# Patient Record
Sex: Male | Born: 1955
Health system: Southern US, Community
[De-identification: ages and names within clinical notes are randomized; demographics above are authoritative.]

## PROBLEM LIST (undated history)

## (undated) DIAGNOSIS — I10 Essential (primary) hypertension: Secondary | ICD-10-CM

## (undated) DIAGNOSIS — R7302 Impaired glucose tolerance (oral): Secondary | ICD-10-CM

## (undated) DIAGNOSIS — F411 Generalized anxiety disorder: Secondary | ICD-10-CM

## (undated) DIAGNOSIS — K589 Irritable bowel syndrome without diarrhea: Secondary | ICD-10-CM

## (undated) DIAGNOSIS — K219 Gastro-esophageal reflux disease without esophagitis: Secondary | ICD-10-CM

## (undated) DIAGNOSIS — F1910 Other psychoactive substance abuse, uncomplicated: Secondary | ICD-10-CM

## (undated) DIAGNOSIS — E785 Hyperlipidemia, unspecified: Secondary | ICD-10-CM

## (undated) DIAGNOSIS — E739 Lactose intolerance, unspecified: Secondary | ICD-10-CM

## (undated) DIAGNOSIS — F17200 Nicotine dependence, unspecified, uncomplicated: Secondary | ICD-10-CM

## (undated) DIAGNOSIS — D649 Anemia, unspecified: Secondary | ICD-10-CM

## (undated) DIAGNOSIS — C801 Malignant (primary) neoplasm, unspecified: Secondary | ICD-10-CM

## (undated) DIAGNOSIS — M5090 Cervical disc disorder, unspecified, unspecified cervical region: Secondary | ICD-10-CM

## (undated) DIAGNOSIS — N529 Male erectile dysfunction, unspecified: Secondary | ICD-10-CM

## (undated) HISTORY — DX: Hyperlipidemia, unspecified: E78.5

## (undated) HISTORY — DX: Anemia, unspecified: D64.9

## (undated) HISTORY — DX: Nicotine dependence, unspecified, uncomplicated: F17.200

## (undated) HISTORY — DX: Cervical disc disorder, unspecified, unspecified cervical region: M50.90

## (undated) HISTORY — DX: Essential (primary) hypertension: I10

## (undated) HISTORY — DX: Male erectile dysfunction, unspecified: N52.9

## (undated) HISTORY — DX: Lactose intolerance, unspecified: E73.9

## (undated) HISTORY — DX: Generalized anxiety disorder: F41.1

## (undated) HISTORY — DX: Impaired glucose tolerance (oral): R73.02

## (undated) HISTORY — PX: NO PAST SURGERIES: SHX2092

## (undated) HISTORY — DX: Other psychoactive substance abuse, uncomplicated: F19.10

---

## 1999-06-17 DIAGNOSIS — F1910 Other psychoactive substance abuse, uncomplicated: Secondary | ICD-10-CM

## 1999-06-17 HISTORY — DX: Other psychoactive substance abuse, uncomplicated: F19.10

## 2000-05-13 ENCOUNTER — Inpatient Hospital Stay (HOSPITAL_COMMUNITY): Admission: EM | Admit: 2000-05-13 | Discharge: 2000-05-22 | Payer: Self-pay | Admitting: Psychiatry

## 2000-05-14 ENCOUNTER — Encounter: Payer: Self-pay | Admitting: Emergency Medicine

## 2000-05-14 ENCOUNTER — Emergency Department (HOSPITAL_COMMUNITY): Admission: EM | Admit: 2000-05-14 | Discharge: 2000-05-14 | Payer: Self-pay | Admitting: Emergency Medicine

## 2004-02-16 ENCOUNTER — Encounter: Admission: RE | Admit: 2004-02-16 | Discharge: 2004-02-16 | Payer: Self-pay | Admitting: Internal Medicine

## 2004-02-17 ENCOUNTER — Encounter: Admission: RE | Admit: 2004-02-17 | Discharge: 2004-02-17 | Payer: Self-pay | Admitting: Internal Medicine

## 2005-06-16 HISTORY — PX: COLONOSCOPY: SHX174

## 2005-09-23 ENCOUNTER — Ambulatory Visit: Payer: Self-pay | Admitting: Internal Medicine

## 2005-10-22 ENCOUNTER — Ambulatory Visit: Payer: Self-pay | Admitting: Internal Medicine

## 2005-10-28 ENCOUNTER — Ambulatory Visit: Payer: Self-pay | Admitting: Internal Medicine

## 2005-11-19 ENCOUNTER — Ambulatory Visit: Payer: Self-pay | Admitting: Gastroenterology

## 2005-12-03 ENCOUNTER — Ambulatory Visit: Payer: Self-pay | Admitting: Gastroenterology

## 2005-12-03 LAB — HM COLONOSCOPY: HM Colonoscopy: NORMAL

## 2006-09-21 ENCOUNTER — Ambulatory Visit: Payer: Self-pay | Admitting: Internal Medicine

## 2006-10-26 ENCOUNTER — Ambulatory Visit: Payer: Self-pay | Admitting: Internal Medicine

## 2006-10-26 LAB — CONVERTED CEMR LAB
ALT: 11 units/L (ref 0–40)
AST: 21 units/L (ref 0–37)
Albumin: 3.5 g/dL (ref 3.5–5.2)
Alkaline Phosphatase: 43 units/L (ref 39–117)
BUN: 6 mg/dL (ref 6–23)
Basophils Absolute: 0 10*3/uL (ref 0.0–0.1)
Basophils Relative: 0.4 % (ref 0.0–1.0)
Bilirubin Urine: NEGATIVE
Bilirubin, Direct: 0.1 mg/dL (ref 0.0–0.3)
CO2: 31 meq/L (ref 19–32)
Calcium: 9 mg/dL (ref 8.4–10.5)
Chloride: 106 meq/L (ref 96–112)
Cholesterol: 186 mg/dL (ref 0–200)
Creatinine, Ser: 0.9 mg/dL (ref 0.4–1.5)
Eosinophils Absolute: 0.2 10*3/uL (ref 0.0–0.6)
Eosinophils Relative: 3.6 % (ref 0.0–5.0)
GFR calc Af Amer: 115 mL/min
GFR calc non Af Amer: 95 mL/min
Glucose, Bld: 152 mg/dL — ABNORMAL HIGH (ref 70–99)
HCT: 41 % (ref 39.0–52.0)
HDL: 58.6 mg/dL (ref >39.0)
Hemoglobin, Urine: NEGATIVE
Hemoglobin: 13.7 g/dL (ref 13.0–17.0)
Ketones, ur: NEGATIVE mg/dL
LDL Cholesterol: 118 mg/dL — ABNORMAL HIGH (ref 0–99)
Leukocytes, UA: NEGATIVE
Lymphocytes Relative: 36.9 % (ref 12.0–46.0)
MCHC: 33.3 g/dL (ref 30.0–36.0)
MCV: 83.3 fL (ref 78.0–100.0)
Monocytes Absolute: 0.6 10*3/uL (ref 0.2–0.7)
Monocytes Relative: 9.7 % (ref 3.0–11.0)
Neutro Abs: 2.9 10*3/uL (ref 1.4–7.7)
Neutrophils Relative %: 49.4 % (ref 43.0–77.0)
Nitrite: NEGATIVE
PSA: 0.21 ng/mL (ref 0.10–4.00)
Platelets: 141 10*3/uL — ABNORMAL LOW (ref 150–400)
Potassium: 3.8 meq/L (ref 3.5–5.1)
RBC: 4.92 M/uL (ref 4.22–5.81)
RDW: 14.4 % (ref 11.5–14.6)
Sodium: 141 meq/L (ref 135–145)
Specific Gravity, Urine: 1.025 (ref 1.000–1.03)
TSH: 1.81 microintl units/mL (ref 0.35–5.50)
Total Bilirubin: 0.8 mg/dL (ref 0.3–1.2)
Total CHOL/HDL Ratio: 3.2
Total Protein, Urine: NEGATIVE mg/dL
Total Protein: 6.6 g/dL (ref 6.0–8.3)
Triglycerides: 49 mg/dL (ref 0–149)
Urine Glucose: NEGATIVE mg/dL
Urobilinogen, UA: 0.2 (ref 0.0–1.0)
VLDL: 10 mg/dL (ref 0–40)
WBC: 5.8 10*3/uL (ref 4.5–10.5)
pH: 6 (ref 5.0–8.0)

## 2006-10-30 ENCOUNTER — Ambulatory Visit: Payer: Self-pay | Admitting: Internal Medicine

## 2006-10-30 LAB — CONVERTED CEMR LAB
BUN: 8 mg/dL (ref 6–23)
CO2: 29 meq/L (ref 19–32)
Calcium: 9.1 mg/dL (ref 8.4–10.5)
Chloride: 105 meq/L (ref 96–112)
Creatinine, Ser: 0.8 mg/dL (ref 0.4–1.5)
GFR calc Af Amer: 132 mL/min
GFR calc non Af Amer: 109 mL/min
Glucose, Bld: 83 mg/dL (ref 70–99)
Hgb A1c MFr Bld: 5.3 % (ref 4.6–6.0)
Potassium: 4.2 meq/L (ref 3.5–5.1)
Sodium: 139 meq/L (ref 135–145)

## 2007-02-03 ENCOUNTER — Encounter: Payer: Self-pay | Admitting: *Deleted

## 2007-02-03 DIAGNOSIS — E785 Hyperlipidemia, unspecified: Secondary | ICD-10-CM | POA: Insufficient documentation

## 2007-02-03 DIAGNOSIS — I10 Essential (primary) hypertension: Secondary | ICD-10-CM

## 2007-02-03 DIAGNOSIS — F411 Generalized anxiety disorder: Secondary | ICD-10-CM | POA: Insufficient documentation

## 2007-02-03 HISTORY — DX: Generalized anxiety disorder: F41.1

## 2007-02-03 HISTORY — DX: Hyperlipidemia, unspecified: E78.5

## 2007-02-03 HISTORY — DX: Essential (primary) hypertension: I10

## 2007-12-01 ENCOUNTER — Ambulatory Visit: Payer: Self-pay | Admitting: Internal Medicine

## 2007-12-02 LAB — CONVERTED CEMR LAB
ALT: 11 units/L (ref 0–53)
AST: 22 units/L (ref 0–37)
Albumin: 3.7 g/dL (ref 3.5–5.2)
Alkaline Phosphatase: 53 units/L (ref 39–117)
BUN: 7 mg/dL (ref 6–23)
Basophils Absolute: 0 10*3/uL (ref 0.0–0.1)
Basophils Relative: 0.4 % (ref 0.0–1.0)
Bilirubin Urine: NEGATIVE
Bilirubin, Direct: 0.1 mg/dL (ref 0.0–0.3)
CO2: 31 meq/L (ref 19–32)
Calcium: 9.1 mg/dL (ref 8.4–10.5)
Chloride: 104 meq/L (ref 96–112)
Cholesterol: 193 mg/dL (ref 0–200)
Creatinine, Ser: 0.9 mg/dL (ref 0.4–1.5)
Eosinophils Absolute: 0.2 10*3/uL (ref 0.0–0.7)
Eosinophils Relative: 3.6 % (ref 0.0–5.0)
GFR calc Af Amer: 115 mL/min
GFR calc non Af Amer: 95 mL/min
Glucose, Bld: 121 mg/dL — ABNORMAL HIGH (ref 70–99)
HCT: 40.7 % (ref 39.0–52.0)
HDL: 62.9 mg/dL (ref >39.0)
Hemoglobin, Urine: NEGATIVE
Hemoglobin: 13.9 g/dL (ref 13.0–17.0)
Hgb A1c MFr Bld: 5.6 % (ref 4.6–6.0)
Ketones, ur: NEGATIVE mg/dL
LDL Cholesterol: 122 mg/dL — ABNORMAL HIGH (ref 0–99)
Leukocytes, UA: NEGATIVE
Lymphocytes Relative: 38.6 % (ref 12.0–46.0)
MCHC: 34.2 g/dL (ref 30.0–36.0)
MCV: 84.2 fL (ref 78.0–100.0)
Monocytes Absolute: 0.4 10*3/uL (ref 0.1–1.0)
Monocytes Relative: 8.9 % (ref 3.0–12.0)
Neutro Abs: 2 10*3/uL (ref 1.4–7.7)
Neutrophils Relative %: 48.5 % (ref 43.0–77.0)
Nitrite: NEGATIVE
PSA: 0.57 ng/mL (ref 0.10–4.00)
Platelets: 130 10*3/uL — ABNORMAL LOW (ref 150–400)
Potassium: 4 meq/L (ref 3.5–5.1)
RBC: 4.83 M/uL (ref 4.22–5.81)
RDW: 13.9 % (ref 11.5–14.6)
Sodium: 141 meq/L (ref 135–145)
Specific Gravity, Urine: 1.025 (ref 1.000–1.03)
TSH: 1.29 microintl units/mL (ref 0.35–5.50)
Total Bilirubin: 0.6 mg/dL (ref 0.3–1.2)
Total CHOL/HDL Ratio: 3.1
Total Protein, Urine: NEGATIVE mg/dL
Total Protein: 7 g/dL (ref 6.0–8.3)
Triglycerides: 39 mg/dL (ref 0–149)
Urine Glucose: NEGATIVE mg/dL
Urobilinogen, UA: 0.2 (ref 0.0–1.0)
VLDL: 8 mg/dL (ref 0–40)
WBC: 4.3 10*3/uL — ABNORMAL LOW (ref 4.5–10.5)
pH: 6 (ref 5.0–8.0)

## 2007-12-07 ENCOUNTER — Ambulatory Visit: Payer: Self-pay | Admitting: Internal Medicine

## 2007-12-31 ENCOUNTER — Ambulatory Visit: Payer: Self-pay | Admitting: Internal Medicine

## 2007-12-31 LAB — CONVERTED CEMR LAB
ALT: 14 units/L (ref 0–53)
AST: 24 units/L (ref 0–37)
Albumin: 3.6 g/dL (ref 3.5–5.2)
Alkaline Phosphatase: 43 units/L (ref 39–117)
Bilirubin, Direct: 0.1 mg/dL (ref 0.0–0.3)
Cholesterol: 152 mg/dL (ref 0–200)
HDL: 54.4 mg/dL (ref >39.0)
Hgb A1c MFr Bld: 5.8 % (ref 4.6–6.0)
LDL Cholesterol: 90 mg/dL (ref 0–99)
Total Bilirubin: 0.8 mg/dL (ref 0.3–1.2)
Total CHOL/HDL Ratio: 2.8
Total Protein: 6.7 g/dL (ref 6.0–8.3)
Triglycerides: 40 mg/dL (ref 0–149)
VLDL: 8 mg/dL (ref 0–40)

## 2008-01-04 ENCOUNTER — Ambulatory Visit: Payer: Self-pay | Admitting: Internal Medicine

## 2008-01-04 DIAGNOSIS — E739 Lactose intolerance, unspecified: Secondary | ICD-10-CM | POA: Insufficient documentation

## 2008-01-04 HISTORY — DX: Lactose intolerance, unspecified: E73.9

## 2008-12-05 ENCOUNTER — Ambulatory Visit: Payer: Self-pay | Admitting: Internal Medicine

## 2008-12-05 LAB — CONVERTED CEMR LAB
ALT: 18 units/L (ref 0–53)
AST: 30 units/L (ref 0–37)
Albumin: 3.7 g/dL (ref 3.5–5.2)
Alkaline Phosphatase: 47 units/L (ref 39–117)
BUN: 13 mg/dL (ref 6–23)
Basophils Absolute: 0 10*3/uL (ref 0.0–0.1)
Basophils Relative: 0.3 % (ref 0.0–3.0)
Bilirubin Urine: NEGATIVE
Bilirubin, Direct: 0.1 mg/dL (ref 0.0–0.3)
CO2: 28 meq/L (ref 19–32)
Calcium: 8.9 mg/dL (ref 8.4–10.5)
Chloride: 106 meq/L (ref 96–112)
Cholesterol: 158 mg/dL (ref 0–200)
Creatinine, Ser: 0.9 mg/dL (ref 0.4–1.5)
Eosinophils Absolute: 0.5 10*3/uL (ref 0.0–0.7)
Eosinophils Relative: 7.2 % — ABNORMAL HIGH (ref 0.0–5.0)
GFR calc non Af Amer: 114.06 mL/min (ref >60)
Glucose, Bld: 75 mg/dL (ref 70–99)
HCT: 37.9 % — ABNORMAL LOW (ref 39.0–52.0)
HDL: 65.1 mg/dL (ref >39.00)
Hemoglobin, Urine: NEGATIVE
Hemoglobin: 12.6 g/dL — ABNORMAL LOW (ref 13.0–17.0)
Ketones, ur: NEGATIVE mg/dL
LDL Cholesterol: 82 mg/dL (ref 0–99)
Leukocytes, UA: NEGATIVE
Lymphocytes Relative: 49.7 % — ABNORMAL HIGH (ref 12.0–46.0)
Lymphs Abs: 3.3 10*3/uL (ref 0.7–4.0)
MCHC: 33.3 g/dL (ref 30.0–36.0)
MCV: 87.4 fL (ref 78.0–100.0)
Monocytes Absolute: 0.6 10*3/uL (ref 0.1–1.0)
Monocytes Relative: 8.7 % (ref 3.0–12.0)
Neutro Abs: 2.3 10*3/uL (ref 1.4–7.7)
Neutrophils Relative %: 34.1 % — ABNORMAL LOW (ref 43.0–77.0)
Nitrite: NEGATIVE
PSA: 0.24 ng/mL (ref 0.10–4.00)
Platelets: 108 10*3/uL — ABNORMAL LOW (ref 150.0–400.0)
Potassium: 4.2 meq/L (ref 3.5–5.1)
RBC: 4.34 M/uL (ref 4.22–5.81)
RDW: 13.3 % (ref 11.5–14.6)
Sodium: 146 meq/L — ABNORMAL HIGH (ref 135–145)
Specific Gravity, Urine: 1.025 (ref 1.000–1.030)
TSH: 1.81 microintl units/mL (ref 0.35–5.50)
Total Bilirubin: 0.7 mg/dL (ref 0.3–1.2)
Total CHOL/HDL Ratio: 2
Total Protein, Urine: NEGATIVE mg/dL
Total Protein: 6.8 g/dL (ref 6.0–8.3)
Triglycerides: 56 mg/dL (ref 0.0–149.0)
Urine Glucose: NEGATIVE mg/dL
Urobilinogen, UA: 1 (ref 0.0–1.0)
VLDL: 11.2 mg/dL (ref 0.0–40.0)
WBC: 6.7 10*3/uL (ref 4.5–10.5)
pH: 6 (ref 5.0–8.0)

## 2008-12-13 ENCOUNTER — Ambulatory Visit: Payer: Self-pay | Admitting: Internal Medicine

## 2009-12-06 ENCOUNTER — Ambulatory Visit: Payer: Self-pay | Admitting: Internal Medicine

## 2010-06-16 HISTORY — PX: OTHER SURGICAL HISTORY: SHX169

## 2010-07-14 LAB — CONVERTED CEMR LAB
ALT: 10 units/L (ref 0–53)
AST: 19 units/L (ref 0–37)
Albumin: 3.7 g/dL (ref 3.5–5.2)
Alkaline Phosphatase: 50 units/L (ref 39–117)
BUN: 11 mg/dL (ref 6–23)
Basophils Absolute: 0.1 10*3/uL (ref 0.0–0.1)
Basophils Relative: 1.5 % (ref 0.0–3.0)
Bilirubin Urine: NEGATIVE
Bilirubin, Direct: 0.1 mg/dL (ref 0.0–0.3)
CO2: 30 meq/L (ref 19–32)
Calcium: 8.9 mg/dL (ref 8.4–10.5)
Chloride: 104 meq/L (ref 96–112)
Cholesterol: 149 mg/dL (ref 0–200)
Creatinine, Ser: 0.9 mg/dL (ref 0.4–1.5)
Eosinophils Absolute: 0.3 10*3/uL (ref 0.0–0.7)
Eosinophils Relative: 4.5 % (ref 0.0–5.0)
GFR calc non Af Amer: 113.61 mL/min (ref >60)
Glucose, Bld: 89 mg/dL (ref 70–99)
HCT: 37.1 % — ABNORMAL LOW (ref 39.0–52.0)
HDL: 56.5 mg/dL (ref >39.00)
Hemoglobin, Urine: NEGATIVE
Hemoglobin: 12.4 g/dL — ABNORMAL LOW (ref 13.0–17.0)
Hgb A1c MFr Bld: 5.9 % (ref 4.6–6.5)
Ketones, ur: NEGATIVE mg/dL
LDL Cholesterol: 79 mg/dL (ref 0–99)
Leukocytes, UA: NEGATIVE
Lymphocytes Relative: 34.9 % (ref 12.0–46.0)
Lymphs Abs: 2.5 10*3/uL (ref 0.7–4.0)
MCHC: 33.5 g/dL (ref 30.0–36.0)
MCV: 83.7 fL (ref 78.0–100.0)
Monocytes Absolute: 0.6 10*3/uL (ref 0.1–1.0)
Monocytes Relative: 7.8 % (ref 3.0–12.0)
Neutro Abs: 3.6 10*3/uL (ref 1.4–7.7)
Neutrophils Relative %: 51.3 % (ref 43.0–77.0)
Nitrite: NEGATIVE
PSA: 0.28 ng/mL (ref 0.10–4.00)
Platelets: 155 10*3/uL (ref 150.0–400.0)
Potassium: 4 meq/L (ref 3.5–5.1)
RBC: 4.43 M/uL (ref 4.22–5.81)
RDW: 15.3 % — ABNORMAL HIGH (ref 11.5–14.6)
Sodium: 139 meq/L (ref 135–145)
Specific Gravity, Urine: >=1.03 (ref 1.000–1.030)
TSH: 1.12 microintl units/mL (ref 0.35–5.50)
Total Bilirubin: 0.5 mg/dL (ref 0.3–1.2)
Total CHOL/HDL Ratio: 3
Total Protein, Urine: NEGATIVE mg/dL
Total Protein: 7.1 g/dL (ref 6.0–8.3)
Triglycerides: 69 mg/dL (ref 0.0–149.0)
Urine Glucose: NEGATIVE mg/dL
Urobilinogen, UA: 1 (ref 0.0–1.0)
VLDL: 13.8 mg/dL (ref 0.0–40.0)
WBC: 7 10*3/uL (ref 4.5–10.5)
pH: 6 (ref 5.0–8.0)

## 2010-07-16 NOTE — Assessment & Plan Note (Signed)
Summary: 1 MO ROV /NWS $50   Vital Signs:  Patient Profile:   55 Years Old Male Weight:      140 pounds Temp:     97.4 degrees F oral Pulse rate:   71 / minute BP sitting:   150 / 101  (left arm) Cuff size:   small  Vitals Entered By: Jerilynn Mages MA (January 04, 2008 8:11 AM)                 Chief Complaint:  1 month ROV.  History of Present Illness: Repeat BP by me in the office - 132/76, no new complaints, o/w doinh well, denies polys or low sugars, compliant with med, no apparent side effects    Updated Prior Medication List: AMLODIPINE BESY-BENAZEPRIL HCL 5-20 MG  CAPS (AMLODIPINE BESY-BENAZEPRIL HCL) 2 by mouth once daily VIAGRA 100 MG  TABS (SILDENAFIL CITRATE) USE PRN PRAVACHOL 40 MG  TABS (PRAVASTATIN SODIUM) 1 po once daily ADULT ASPIRIN EC LOW STRENGTH 81 MG  TBEC (ASPIRIN) 1 by mouth once daily  Current Allergies (reviewed today): No known allergies   Past Medical History:    Reviewed history from 12/07/2007 and no changes required:       Hypertension       Hyperlipidemia       Anxiety       irritable bowel syndrome       erectile dysfunction       glucose intoleracne  Past Surgical History:    Reviewed history from 12/07/2007 and no changes required:       Denies surgical history   Social History:    Reviewed history from 12/07/2007 and no changes required:       Current Smoker       Alcohol use-no - quit 2000 - prior heavy       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       Married    Review of Systems       all otherwise negative    Physical Exam  General:     Well-developed,well-nourished,in no acute distress; alert,appropriate and cooperative throughout examination Head:     Normocephalic and atraumatic without obvious abnormalities. No apparent alopecia or balding. Eyes:     No corneal or conjunctival inflammation noted. EOMI. Perrla. Ears:     External ear exam shows no significant lesions or deformities.  Otoscopic examination  reveals clear canals, tympanic membranes are intact bilaterally without bulging, retraction, inflammation or discharge. Hearing is grossly normal bilaterally. Nose:     External nasal examination shows no deformity or inflammation. Nasal mucosa are pink and moist without lesions or exudates. Mouth:     Oral mucosa and oropharynx without lesions or exudates.  Teeth in good repair. Neck:     No deformities, masses, or tenderness noted. Lungs:     Normal respiratory effort, chest expands symmetrically. Lungs are clear to auscultation, no crackles or wheezes. Heart:     Normal rate and regular rhythm. S1 and S2 normal without gallop, murmur, click, rub or other extra sounds. Extremities:     no edema, no ulcers     Impression & Recommendations:  Problem # 1:  HYPERLIPIDEMIA (ICD-272.4)  His updated medication list for this problem includes:    Pravachol 40 Mg Tabs (Pravastatin sodium) .Marland Kitchen... 1 po once daily stable overall by hx and exam, ok to continue meds/tx as is /improved  Problem # 2:  HYPERTENSION (ICD-401.9)  His  updated medication list for this problem includes:    Amlodipine Besy-benazepril Hcl 5-20 Mg Caps (Amlodipine besy-benazepril hcl) .Marland Kitchen... 2 by mouth once daily  BP today: 150/101 Prior BP: 181/100 (12/07/2007)  Labs Reviewed: Creat: 0.9 (12/01/2007) Chol: 152 (12/31/2007)   HDL: 54.4 (12/31/2007)   LDL: 90 (12/31/2007)   TG: 40 (12/31/2007) improved, cont meds as is  Problem # 3:  GLUCOSE INTOLERANCE (ICD-271.3) minor, a1c normal, ok to followup next visit  Complete Medication List: 1)  Amlodipine Besy-benazepril Hcl 5-20 Mg Caps (Amlodipine besy-benazepril hcl) .... 2 by mouth once daily 2)  Viagra 100 Mg Tabs (Sildenafil citrate) .... Use prn 3)  Pravachol 40 Mg Tabs (Pravastatin sodium) .Marland Kitchen.. 1 po once daily 4)  Adult Aspirin Ec Low Strength 81 Mg Tbec (Aspirin) .Marland Kitchen.. 1 by mouth once daily   Patient Instructions: 1)  Continue all medications that you may  have been taking previously 2)  Please schedule a follow-up appointment in 1 year (june 2010) with CPX labs   ]

## 2010-07-16 NOTE — Assessment & Plan Note (Signed)
Summary: 1 YR FU---$50---STC   Vital Signs:  Patient profile:   55 year old male Height:      72 inches Weight:      149.50 pounds BMI:     20.35 O2 Sat:      98 % on Room air Temp:     97.3 degrees F oral Pulse rate:   71 / minute BP sitting:   130 / 72  (left arm) Cuff size:   regular  Vitals Entered By: Zella Ball Ewing CMA Duncan Dull) (December 06, 2009 3:36 PM)  O2 Flow:  Room air CC: 1 year followup/RE   CC:  1 year followup/RE.  History of Present Illness: BP at the stores usually in the lower 140's or better; Pt denies CP, sob, doe, wheezing, orthopnea, pnd, worsening LE edema, palps, dizziness or syncope   Pt denies new neuro symptoms such as headache, facial or extremity weakness   saw optho with right eye infection , now resolved   Preventive Screening-Counseling & Management      Drug Use:  no.    Problems Prior to Update: 1)  Preventive Health Care  (ICD-V70.0) 2)  Glucose Intolerance  (ICD-271.3) 3)  Preventive Health Care  (ICD-V70.0) 4)  Anxiety  (ICD-300.00) 5)  Hyperlipidemia  (ICD-272.4) 6)  Hypertension  (ICD-401.9)  Medications Prior to Update: 1)  Amlodipine Besy-Benazepril Hcl 10-20 Mg Caps (Amlodipine Besy-Benazepril Hcl) .Marland Kitchen.. 1po Once Daily 2)  Viagra 100 Mg  Tabs (Sildenafil Citrate) .... Use Asd 1 By Mouth Every Other Day As Needed 3)  Pravachol 40 Mg  Tabs (Pravastatin Sodium) .Marland Kitchen.. 1 Po Once Daily 4)  Adult Aspirin Ec Low Strength 81 Mg  Tbec (Aspirin) .Marland Kitchen.. 1 By Mouth Once Daily 5)  Fish Oil 1000 Mg Caps (Omega-3 Fatty Acids) .Marland Kitchen.. 1 Cap Two Times A Day 6)  Garlic-Parsley  Caps (Garlic-Parsley) .Marland Kitchen.. 1 Tab Once A Day  Current Medications (verified): 1)  Amlodipine Besy-Benazepril Hcl 10-20 Mg Caps (Amlodipine Besy-Benazepril Hcl) .Marland Kitchen.. 1po Once Daily 2)  Viagra 100 Mg  Tabs (Sildenafil Citrate) .... Use Asd 1 By Mouth Every Other Day As Needed 3)  Pravachol 40 Mg  Tabs (Pravastatin Sodium) .Marland Kitchen.. 1 Po Once Daily 4)  Adult Aspirin Ec Low Strength 81 Mg   Tbec (Aspirin) .Marland Kitchen.. 1 By Mouth Once Daily 5)  Fish Oil 1000 Mg Caps (Omega-3 Fatty Acids) .Marland Kitchen.. 1 Cap Two Times A Day 6)  Garlic-Parsley  Caps (Garlic-Parsley) .Marland Kitchen.. 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         all otherwise negative per pt -    Physical Exam  General:  alert and well-developed.   Head:  normocephalic and atraumatic.   Eyes:  vision grossly intact, pupils equal, and pupils round.   Ears:  R ear normal and L ear normal.   Nose:  no external deformity and no nasal discharge.   Mouth:  no gingival abnormalities and pharynx pink and moist.   Neck:  supple and no masses.   Lungs:  normal respiratory effort and normal breath  sounds.   Heart:  normal rate and regular rhythm.   Abdomen:  soft, non-tender, and normal bowel sounds.   Msk:  no joint tenderness and no joint swelling.   Extremities:  no edema, no erythema  Neurologic:  cranial nerves II-XII intact and strength normal in all extremities.     Impression & Recommendations:  Problem # 1:  Preventive Health Care (ICD-V70.0) Overall doing well, age appropriate education and counseling updated and referral for appropriate preventive services done unless declined, immunizations up to date or declined, diet counseling done if overweight, urged to quit smoking if smokes , most recent labs reviewed and current ordered if appropriate, ecg reviewed or declined (interpretation per ECG scanned in the EMR if done); information regarding Medicare Prevention requirements given if appropriate; speciality referrals updated as appropriate  Orders: EKG w/ Interpretation (93000) TLB-BMP (Basic Metabolic Panel-BMET) (80048-METABOL) TLB-CBC Platelet - w/Differential (85025-CBCD) TLB-Hepatic/Liver Function Pnl (80076-HEPATIC) TLB-Lipid Panel (80061-LIPID) TLB-TSH (Thyroid Stimulating Hormone) (84443-TSH) TLB-PSA (Prostate Specific Antigen) (84153-PSA) TLB-Udip ONLY (81003-UDIP)  Problem # 2:  GLUCOSE INTOLERANCE (ICD-271.3)  Orders: TLB-A1C / Hgb A1C (Glycohemoglobin) (83036-A1C) asymjpt - for a1cc check  Problem # 3:  HYPERTENSION (ICD-401.9)  His updated medication list for this problem includes:    Amlodipine Besy-benazepril Hcl 10-20 Mg Caps (Amlodipine besy-benazepril hcl) .Marland Kitchen... 1po once daily  BP today: 130/72 Prior BP: 130/82 (12/13/2008)  Labs Reviewed: K+: 4.2 (12/05/2008) Creat: : 0.9 (12/05/2008)   Chol: 158 (12/05/2008)   HDL: 65.10 (12/05/2008)   LDL: 82 (12/05/2008)   TG: 56.0 (12/05/2008) stable overall by hx and exam, ok to continue meds/tx as is   Problem # 4:  HYPERLIPIDEMIA (ICD-272.4)  His updated medication list for this problem  includes:    Pravachol 40 Mg Tabs (Pravastatin sodium) .Marland Kitchen... 1 po once daily  Labs Reviewed: SGOT: 30 (12/05/2008)   SGPT: 18 (12/05/2008)   HDL:65.10 (12/05/2008), 54.4 (12/31/2007)  LDL:82 (12/05/2008), 90 (12/31/2007)  Chol:158 (12/05/2008), 152 (12/31/2007)  Trig:56.0 (12/05/2008), 40 (12/31/2007) stable overall by hx and exam, ok to continue meds/tx as is   Complete Medication List: 1)  Amlodipine Besy-benazepril Hcl 10-20 Mg Caps (Amlodipine besy-benazepril hcl) .Marland Kitchen.. 1po once daily 2)  Viagra 100 Mg Tabs (Sildenafil citrate) .... Use asd 1 by mouth every other day as needed 3)  Pravachol 40 Mg Tabs (Pravastatin sodium) .Marland Kitchen.. 1 po once daily 4)  Adult Aspirin Ec Low Strength 81 Mg Tbec (Aspirin) .Marland Kitchen.. 1 by mouth once daily 5)  Fish Oil 1000 Mg Caps (Omega-3 fatty acids) .Marland Kitchen.. 1 cap two times a day 6)  Garlic-parsley Caps (Garlic-parsley) .Marland Kitchen.. 1 tab once a day  Patient Instructions: 1)  Continue all previous medications as before this visit  2)  Please go to the Lab in the basement for your blood and/or urine tests today 3)  Please schedule a follow-up appointment in 1 year or sooner if needed Prescriptions: PRAVACHOL 40 MG  TABS (  PRAVASTATIN SODIUM) 1 po once daily  #90 x 3   Entered and Authorized by:   Corwin Levins MD   Signed by:   Corwin Levins MD on 12/06/2009   Method used:   Print then Give to Patient   RxID:   [id number] VIAGRA 100 MG  TABS (SILDENAFIL CITRATE) use asd 1 by mouth every other day as needed  #5 x 11   Entered and Authorized by:   Corwin Levins MD   Signed by:   Corwin Levins MD on 12/06/2009   Method used:   Print then Give to Patient   RxID:   [id number] AMLODIPINE BESY-BENAZEPRIL HCL 10-20 MG CAPS (AMLODIPINE BESY-BENAZEPRIL HCL) 1po once daily  #90 x 3   Entered and Authorized by:   Corwin Levins MD   Signed by:   Corwin Levins MD on 12/06/2009   Method used:   Print then Give to Patient   RxID:   [id number]

## 2010-07-16 NOTE — Assessment & Plan Note (Signed)
Summary: PHYSICAL-$50-STC   Vital Signs:  Patient Profile:   55 Years Old Male Weight:      140.0 pounds Temp:     97.0 degrees F oral Pulse rate:   77 / minute BP sitting:   181 / 100  (right arm) Cuff size:   regular  Pt. in pain?   no  Vitals Entered By: Orlan Leavens (December 07, 2007 8:24 AM)                  Preventive Care Screening  Colonoscopy:    Date:  12/03/2005    Next Due:  12/2015    Results:  normal    Chief Complaint:  CPX RECHECK BP IN (L) ARM 182/96 (P) 72.  History of Present Illness: never started the norvasc last year for some reason; wt stable,  but BP still elev in the 150 range for the most part; overall o/w doing well, no complaints    Updated Prior Medication List: AMLODIPINE BESY-BENAZEPRIL HCL 5-20 MG  CAPS (AMLODIPINE BESY-BENAZEPRIL HCL) 2 by mouth once daily VIAGRA 100 MG  TABS (SILDENAFIL CITRATE) USE PRN PRAVACHOL 40 MG  TABS (PRAVASTATIN SODIUM) 1 po once daily ADULT ASPIRIN EC LOW STRENGTH 81 MG  TBEC (ASPIRIN) 1 by mouth once daily  Current Allergies (reviewed today): No known allergies   Past Medical History:    Reviewed history from 02/03/2007 and no changes required:       Hypertension       Hyperlipidemia       Anxiety       irritable bowel syndrome       erectile dysfunction       glucose intoleracne  Past Surgical History:    Reviewed history and no changes required:       Denies surgical history   Family History:    Reviewed history and no changes required:       HTN       stroke       cousin with cancer       mother with cancer esophagus  Social History:    Reviewed history and no changes required:       Current Smoker       Alcohol use-no - quit 2000 - prior heavy       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       Married   Risk Factors:  Tobacco use:  current Alcohol use:  no  Colonoscopy History:     Date of Last Colonoscopy:  12/03/2005    Results:  normal    Review of Systems  The patient  denies anorexia, fever, weight loss, weight gain, vision loss, decreased hearing, hoarseness, chest pain, syncope, dyspnea on exertion, peripheral edema, prolonged cough, headaches, hemoptysis, abdominal pain, melena, hematochezia, severe indigestion/heartburn, hematuria, incontinence, genital sores, muscle weakness, suspicious skin lesions, transient blindness, difficulty walking, depression, unusual weight change, abnormal bleeding, enlarged lymph nodes, angioedema, breast masses, and testicular masses.         all otherwise negative    Physical Exam  General:     Well-developed,well-nourished,in no acute distress; alert,appropriate and cooperative throughout examination Head:     Normocephalic and atraumatic without obvious abnormalities. No apparent alopecia or balding. Eyes:     No corneal or conjunctival inflammation noted. EOMI. Perrla Ears:     External ear exam shows no significant lesions or deformities.  Otoscopic examination reveals clear canals, tympanic membranes are intact  bilaterally without bulging, retraction, inflammation or discharge. Hearing is grossly normal bilaterally. Nose:     External nasal examination shows no deformity or inflammation. Nasal mucosa are pink and moist without lesions or exudates. Mouth:     Oral mucosa and oropharynx without lesions or exudates.  Teeth in good repair. Neck:     No deformities, masses, or tenderness noted. Lungs:     Normal respiratory effort, chest expands symmetrically. Lungs are clear to auscultation, no crackles or wheezes. Heart:     Normal rate and regular rhythm. S1 and S2 normal without gallop, murmur, click, rub or other extra sounds. Abdomen:     Bowel sounds positive,abdomen soft and non-tender without masses, organomegaly or hernias noted. Genitalia:     Testes bilaterally descended without nodularity, tenderness or masses. No scrotal masses or lesions. No penis lesions or urethral discharge. Msk:     No deformity  or scoliosis noted of thoracic or lumbar spine.   Extremities:     No clubbing, cyanosis, edema, or deformity noted with normal full range of motion of all joints.   Neurologic:     No cranial nerve deficits noted. Station and gait are normal. Plantar reflexes are down-going bilaterally. DTRs are symmetrical throughout. Sensory, motor and coordinative functions appear intact.    Impression & Recommendations:  Problem # 1:  Preventive Health Care (ICD-V70.0) Overall doing well, up to date, counseled on routine health concerns for screening and prevention, immunizations up to date or declined, labs reviewed, ecg reviewed, start ASA 81 ,mg per day   Orders: EKG w/ Interpretation (93000)   Problem # 2:  HYPERTENSION (ICD-401.9)  His updated medication list for this problem includes:    Amlodipine Besy-benazepril Hcl 5-20 Mg Caps (Amlodipine besy-benazepril hcl) .Marland Kitchen... 2 by mouth once daily adjust meds to above  Problem # 3:  HYPERLIPIDEMIA (ICD-272.4)  His updated medication list for this problem includes:    Pravachol 40 Mg Tabs (Pravastatin sodium) .Marland Kitchen... 1 po once daily treat as above, f/u any worsening signs or symptoms . f/u 4 wks  Complete Medication List: 1)  Amlodipine Besy-benazepril Hcl 5-20 Mg Caps (Amlodipine besy-benazepril hcl) .... 2 by mouth once daily 2)  Viagra 100 Mg Tabs (Sildenafil citrate) .... Use prn 3)  Pravachol 40 Mg Tabs (Pravastatin sodium) .Marland Kitchen.. 1 po once daily 4)  Adult Aspirin Ec Low Strength 81 Mg Tbec (Aspirin) .Marland Kitchen.. 1 by mouth once daily   Patient Instructions: 1)  Take an Aspirin every day - 81 mg - 1 per day - COATED only -  2)  stop the benazepril 40 mg  3)  start the amlodipine-benazepril 5/20 mg - 2 per day - use your united insurance to pay for this med 4)  start the pravastatin 40 mg - 1 per day - use CASH to pay for this 5)  Please schedule a follow-up appointment in 1 month with: 6)  Hepatic Panel prior to visit, ICD-9: v58.69 7)  Lipid  Panel prior to visit, ICD-9: 272.0 8)  Check your Blood Pressure regularly. We want your blood pressure to be on average less than 140/90   Prescriptions: PRAVACHOL 40 MG  TABS (PRAVASTATIN SODIUM) 1 po once daily  #90 x 3   Entered and Authorized by:   Corwin Levins MD   Signed by:   Corwin Levins MD on 12/07/2007   Method used:   Print then Give to Patient   RxID:   [id number] AMLODIPINE BESY-BENAZEPRIL HCL 5-20 MG  CAPS (AMLODIPINE BESY-BENAZEPRIL HCL) 2 by mouth once daily  #60 x 11   Entered and Authorized by:   Corwin Levins MD   Signed by:   Corwin Levins MD on 12/07/2007   Method used:   Print then Give to Patient   RxID:   [id number]  ]

## 2010-07-16 NOTE — Assessment & Plan Note (Signed)
Summary: CPX/ $50 /NWS-RS BUMP-DESK--STC   Vital Signs:  Patient profile:   55 year old male Height:      72 inches Weight:      138 pounds BMI:     18.78 Temp:     97.1 degrees F oral Pulse rate:   60 / minute BP sitting:   130 / 82  (left arm) Cuff size:   regular  Vitals Entered By: Bill Salinas CMA (December 13, 2008 8:24 AM)  Preventive Care Screening  Last Tetanus Booster:    Date:  12/13/2008    Results:  Tdap  CC: Pt here for CPX/ ab   CC:  Pt here for CPX/ ab.  History of Present Illness: Bp at home often just over 140/96 but has not checkedin the past 2 months; has been eating less overall with a new joib and new schedule on second shift 5 days per wk; lost about 6 lbs overall; overall o/w doing well, no new complaints  Problems Prior to Update: 1)  Preventive Health Care  (ICD-V70.0) 2)  Glucose Intolerance  (ICD-271.3) 3)  Preventive Health Care  (ICD-V70.0) 4)  Anxiety  (ICD-300.00) 5)  Hyperlipidemia  (ICD-272.4) 6)  Hypertension  (ICD-401.9)  Medications Prior to Update: 1)  Amlodipine Besy-Benazepril Hcl 5-20 Mg  Caps (Amlodipine Besy-Benazepril Hcl) .... 2 By Mouth Once Daily 2)  Viagra 100 Mg  Tabs (Sildenafil Citrate) .... Use Prn 3)  Pravachol 40 Mg  Tabs (Pravastatin Sodium) .Marland Kitchen.. 1 Po Once Daily 4)  Adult Aspirin Ec Low Strength 81 Mg  Tbec (Aspirin) .Marland Kitchen.. 1 By Mouth Once Daily  Current Medications (verified): 1)  Amlodipine Besy-Benazepril Hcl 10-20 Mg Caps (Amlodipine Besy-Benazepril Hcl) .Marland Kitchen.. 1po Once Daily 2)  Viagra 100 Mg  Tabs (Sildenafil Citrate) .... Use Asd 1 By Mouth Every Other Day As Needed 3)  Pravachol 40 Mg  Tabs (Pravastatin Sodium) .Marland Kitchen.. 1 Po Once Daily 4)  Adult Aspirin Ec Low Strength 81 Mg  Tbec (Aspirin) .Marland Kitchen.. 1 By Mouth Once Daily 5)  Fish Oil 1000 Mg Caps (Omega-3 Fatty Acids) .Marland Kitchen.. 1 Cap Two Times A Day 6)  Garlic-Parsley  Caps (Garlic-Parsley) .Marland Kitchen.. 1 Tab Once A Day  Allergies (verified): No Known Drug Allergies  Past  History:  Past Medical History: Last updated: 12/07/2007 Hypertension Hyperlipidemia Anxiety irritable bowel syndrome erectile dysfunction glucose intoleracne  Past Surgical History: Last updated: 12/07/2007 Denies surgical history  Family History: Last updated: 12/07/2007 HTN stroke cousin with cancer mother with cancer esophagus  Social History: Last updated: 12/07/2007 Current Smoker Alcohol use-no - quit 2000 - prior heavy work - Copy no children Married  Risk Factors: Smoking Status: current (12/07/2007)  Review of Systems  The patient denies anorexia, fever, weight loss, weight gain, vision loss, decreased hearing, hoarseness, chest pain, syncope, dyspnea on exertion, peripheral edema, prolonged cough, headaches, hemoptysis, abdominal pain, melena, hematochezia, severe indigestion/heartburn, hematuria, incontinence, muscle weakness, suspicious skin lesions, transient blindness, difficulty walking, depression, unusual weight change, abnormal bleeding, enlarged lymph nodes, and angioedema.         all otherwise negative   Physical Exam  General:  alert and well-developed.   Head:  normocephalic and atraumatic.   Eyes:  vision grossly intact, pupils equal, and pupils round.   Ears:  R ear normal and L ear normal.   Nose:  no external deformity and no nasal discharge.   Mouth:  no gingival abnormalities and pharynx pink and moist.   Neck:  supple and no masses.  Lungs:  normal respiratory effort and normal breath sounds.   Heart:  normal rate, regular rhythm, and no murmur.   Abdomen:  soft, non-tender, and normal bowel sounds.   Msk:  no joint tenderness and no joint swelling.   Extremities:  no edema, no ulcers  Neurologic:  cranial nerves II-XII intact and strength normal in all extremities.     Impression & Recommendations:  Problem # 1:  PREVENTIVE HEALTH CARE (ICD-V70.0) Overall doing well, up to date, counseled on routine health concerns for  screening and prevention, immunizations up to date or declined, labs reviewed  Problem # 2:  HYPERTENSION (ICD-401.9)  His updated medication list for this problem includes:    Amlodipine Besy-benazepril Hcl 10-20 Mg Caps (Amlodipine besy-benazepril hcl) .Marland Kitchen... 1po once daily increase to the 10/20 strength for better sbp control closer to goal 110 to 120 range  Complete Medication List: 1)  Amlodipine Besy-benazepril Hcl 10-20 Mg Caps (Amlodipine besy-benazepril hcl) .Marland Kitchen.. 1po once daily 2)  Viagra 100 Mg Tabs (Sildenafil citrate) .... Use asd 1 by mouth every other day as needed 3)  Pravachol 40 Mg Tabs (Pravastatin sodium) .Marland Kitchen.. 1 po once daily 4)  Adult Aspirin Ec Low Strength 81 Mg Tbec (Aspirin) .Marland Kitchen.. 1 by mouth once daily 5)  Fish Oil 1000 Mg Caps (Omega-3 fatty acids) .Marland Kitchen.. 1 cap two times a day 6)  Garlic-parsley Caps (Garlic-parsley) .Marland Kitchen.. 1 tab once a day  Other Orders: Tdap => 42yrs IM ([address]109) Admin 1st Vaccine (60454)  Patient Instructions: 1)  you had the tetanus shot today 2)  your medications were sent to the pharmacy on the computer 3)  increase the amlodipine-benazepril to 10/20 mg  - 1 per day 4)  Continue all medications that you may have been taking previously  5)  Please schedule a follow-up appointment in 1 year or sooner if needed 6)  Check your Blood Pressure regularly. If it is above 140/90: you should make an appointment. Prescriptions: PRAVACHOL 40 MG  TABS (PRAVASTATIN SODIUM) 1 po once daily  #90 x 3   Entered and Authorized by:   Corwin Levins MD   Signed by:   Corwin Levins MD on 12/13/2008   Method used:   Electronically to        [facility] [phone]* (retail)       [address]       [place], [place]  [address]       Ph: [phone]       Fax: [phone]   RxID:   [id number] AMLODIPINE BESY-BENAZEPRIL HCL 10-20 MG CAPS (AMLODIPINE BESY-BENAZEPRIL HCL) 1po once daily  #30 x 11   Entered and Authorized by:   Corwin Levins MD   Signed by:    Corwin Levins MD on 12/13/2008   Method used:   Electronically to        [facility] [phone]* (retail)       [address]       [place], [place]  [address]       Ph: [phone]       Fax: [phone]   RxID:   [id number] VIAGRA 100 MG  TABS (SILDENAFIL CITRATE) use asd 1 by mouth every other day as needed  #5 x 11   Entered and Authorized by:   Corwin Levins MD   Signed by:   Corwin Levins MD on 12/13/2008   Method used:   Electronically to  [facility] [address] [phone]* (retail)       [address]       [place], [place]  [address]       Ph: [phone]       Fax: [phone]   RxID:   [id number]    Immunizations Administered:  Tetanus Vaccine:    Vaccine Type: Tdap    Site: left deltoid    Mfr: GlaxoSmithKline    Dose: 0.5 ml    Route: IM    Given by: Ami Bullins CMA    Exp. Date: 04/19/2011    Lot #: [id number]    VIS given: 05/04/07 version given December 13, 2008.

## 2010-11-01 NOTE — H&P (Signed)
[facility]  Patient:    Nicolas Allen, Nicolas Allen                        MRN: [id number] Adm. Date:  [id number] Attending:  Beryle Beams                         History and Physical  IDENTIFYING INFORMATION:  Mr. Alabi is a 55 year old African American married male admitted 05/13/00 on a voluntary basis after being seen at [facility].  At that time, the patient appeared confused, was not answering questions and appeared to be psychotic.  HISTORY OF PRESENT ILLNESS:  The patient is a poor historian and a lot of his information is provided by his wife.  The patient apparently accompanied wife to the [facility] yesterday where he presented very confused and not answering questions.  The wife reported that he had been staring at the wall, crying a lot, pacing, and wandering at night.  She states she noticed a behavior change over the month but the more acute behavior change occurred in the past two to three days.  According to the wife, he was making no sense whatsoever.  He was not eating, and there is some question of a  weight loss of 71 pounds in three months.  The patient says he has not lost 71 pounds in three months and he may have lost five to seven pounds in one month.  He apparently had been going to work but apparently at work he appeared confused.  The wife reported that he is worried about his mother, work, and just changes in his life, although this is not specific.  No history of seizures.  No history of black outs. No history of DTs.  No history of suicide attempts.  No history of violence.  Apparently, he does worry a lot. Today when I talked with him, he did admit to hearing voices yesterday and he spoke of something about owing money or missing some time.  He was paranoid about the people at work.  He felt like they were staring at him and laughing at him.  He also acknowledged that he felt confused  at times and did have trouble understanding how to do things.  He also described having a headache last week.  He said it was an excruciating headache over his left temple.  He said it lasted for about a day and then it went away.  He denies visions and some questions he chose not answer.  PAST PSYCHIATRIC HISTORY: The patient says none, and also wife says none.  PAST MEDICAL HISTORY:  Patients primary care physician is Dr. Megan Mans in [place], [place] who he last saw over two years ago.  His medical problems according to the chart are irritable bowel syndrome, gastroesophageal reflux disease, and night cough.  No history of surgery.  Of course, he has been confused with impaired memory.  MEDICATIONS:  Citrucel q day.  Otherwise, none known.  Apparently the Cirtacel is for the irritable bowel syndrome.  DRUG ALLERGIES:  No known drug allergies.  SOCIAL HISTORY:  The patient has been married for 22 years.  No children.  He works at [employer] for the past 26 years.  He has his GED.  Two sisters, two brothers.  His father is deceased.  His mother lives in [place].  FAMILY HISTORY:  Unknown.  Alcohol. Drug  history.  The patient states that he drinks about a six pack of beer for over 10 years.  According to the wife, it has been less in the past week since he has been confused.  The patient did acknowledge that maybe he drank more than a six pack on occasion.  He denies substance abuse.  He smokes about two packs of cigarettes a day.  REVIEW OF SYSTEMS:  Patient denies any surgery.  No known long term medical problems.  Cardiac:  He denies any problems.  No hypertension.  Sometimes has panic with anxiety.  Pulmonary:  Patient does smoke about two packs a day and he does have a night cough.  Neurologic:  Apparently he did have a severe headache last week.  Said he had never had a headache like this before.  He denies history of seizures.  No history of falling.  No blackouts.   Again, there is confusion and impaired memory recently and remote.  No extremity weakness or numbness.  There appears to be some short and long term memory difficulties.  Hematology:  He denies any problems.  No history of sickle cell anemia or bleeding disorder.  endocrine:  He denies any problems.  No diabetes. No thyroid problems.  GI:  Patient apparently has gastroesophageal reflux and apparently has irritable bowel syndrome and takes Citrucel for this.  GU:  Denies any problems.  No urinary frequency, urgency, incontinence, hematuria, or nocturia.  Musculoskeletal:  Denies any problems, no joint pain or joint swelling.  ENT:  Patient does have impaired vision and wears glasses for this.  He is sexually active, preventive care.  He had prostate checked in 2000 which was within normal limits.  He had a tooth capped three months ago. Skin mucosa:  He has complaint of some moles on his back.  Otherwise, no problem     lem.  Pain:  He has bunion on his right foot.  Sleep:  Apparently he has been having difficulty sleeping over the past several days.  Nutrition:  He has had a weight loss of about seven to eight pounds over about one month.  Patient states that he eats but sometimes he is not hungry.  Vital signs:  Temperature 98.2.  Pulse 86. Respirations 16.  Blood pressure 121/79.  Height:  Six foot.  Weight:  138 pounds.  LABORATORY:  His lab work is pending.  PHYSICAL EXAM:  Pending.  CURRENT MENTAL STATUS EXAM:  A tall, thin, African American male who is dressed in a hospital gown.  He has good eye contact, however, he is trying to be cooperative but he appears confused and bewildered at times.  Sometimes, he is able to answer questions.  His speech is slow and sometimes it is hard to follow and his thinking is sometimes relevant to what is being asked and sometimes it is irrelevant.  He almost seems to have trouble remembering a word or what he wants to say.  As for his mood, he  seems perplexed.  His affect is inappropriate. He denies suicidal ideations.  Thought process:  The patient has disorganized thinking.  He did acknowledge auditory hallucinations  yesterday.  He denies visual hallucinations.  Cognitive.  He is alert.  He is oriented times 5, but he still appears confused, perplexed, his judgement is poor, his insight is poor, and he seems to have a lot of difficulty processing thoughts.  We will do a full mental status exam today.  CURRENT DIAGNOSIS: Axis I:  Psychotic disorder. NOS.  Alcohol abuse. Axis II:  None. Axis III: Irritable bowel syndrome and gastroesophageal reflux disease. Axis IV:  Moderate related to his concerns about financial issues and his perceived problems from his primary support group. Axis V:   Current global assessment functioning 30 and in the past year 75.  TREATMENT PLAN AND RECOMMENDATION:  Voluntary admission to [facility].  Maintain safety check q 15 minutes.  Redirect if he becomes confused.  Risperdal 3.5 mg q a.m. NPO, Risperdal 1 mg p.o. HS.  thymine 100 mg q day po.  Multivitamin 1 tab p.o. q day.  Haldol 5 mg p.o. q 4 hours p.r.n. agitation.  Benadryl 50 mg p.o. p.r.n. q 4 hours.  Citrucel 1 packet q day p.o. for irritable bowel syndrome.  TENTATIVE LENGTH OF STAY:  Five days. DD:  05/14/00 TD:  05/14/00 Job: [id number]

## 2010-11-01 NOTE — Discharge Summary (Signed)
[facility]  Patient:    Nicolas Allen, Nicolas Allen                        MRN: [id number] Adm. Date:  [id number] Disc. Date: [id number] Attending:  Marlyn Corporal Fabmy Dictator:   Candi Leash. Orsini, N.P.                           Discharge Summary  HISTORY OF PRESENT ILLNESS:  This client is a 55 year old African-American male, married, admitted on a voluntary basis with patient appearing confused and appearing to be psychotic.  Patient presented as a poor historian.  Much information was provided by his spouse.  Wife states that patient was staring at the wall, crying, pacing and wandering at night.  This behavior change had occurred over the past month but more acutely over the past 2-3 days.  Patient has also had a weight loss of approximately 71 pounds over a three month period.  Patient was admitting to auditory hallucinations and feeling somewhat paranoid about the people at work.  Patient did acknowledge that he felt confused and was having trouble understanding.  Patient was also experiencing a headache that he had last week that was excruciating occurring over his left temple.  Patient was denying any visions and chose not to answer some questions that were asked.  PAST PSYCHIATRIC HISTORY:  Patient has no psychiatric history.  PRIMARY CARE Leeah Politano:  Patients primary care Ronnell Makarewicz is Dr. Megan Mans in [place].  MEDICAL PROBLEMS:  Irritable bowel syndrome, GERD and a cough at night. Patient again presented with some confusion and impaired memory.  ADMISSION MEDICATIONS:  Citrucel everyday.  DRUG ALLERGIES:  No known drug allergies.  REVIEW OF SYSTEMS:  Patient had some problems with anxiety.  Patient smoked with having a night cough.  Neurologically, patient reported that he was having a severe headache, never having a headache like that before.  Denying any seizure activity, falling or blackouts.  Patient denied any extremity weakness or  numbness.  PHYSICAL EXAMINATION:  Vital signs were within normal limits.  Temperature 98.2, heart rate 86, respirations 16, blood pressure 120/79.  MENTAL STATUS EXAMINATION:  Tall, thin African-American male dressed in hospital gown.  He had good eye contact.  Trying to be cooperative but appears confused and bewildered.  Patient is able to answer some questions.  His speech was slow and difficult to follow his thinking, which was sometimes relevant to what was being asked and sometimes irrelevant.  Patient seemed to have trouble remembering a word or what he wants to say.  As for mood, it seems perplexed.  Affect is inappropriate.  He denies suicidal ideation. Thought processes:  Patient presents with disorganized thinking.  He did acknowledge auditory hallucinations.  Denied visual hallucinations. Cognitively, he is alert.  He is oriented x 5 but appears confused and perplexed with his judgment poor and his insight poor and he seems to have a lot of difficulty processing thoughts.  ADMITTING DIAGNOSES: Axis I:    1. Psychotic disorder not otherwise specified.            2. Alcohol abuse. Axis II:   None. Axis III:  1. Irritable bowel syndrome.            2. Gastroesophageal reflux disease. Axis IV:   Moderate (related to concerns about financial issues and perceived            problems from  his primary support group). Axis V:    Current Global Assessment of Functioning 30; this past year being            75.  LABORATORY DATA:  Admission CBC:  hemoglobin and hematocrit somewhat low at 12.9 and 37.7, platelets were 144.  Valproic acid was 62.2.  His urinalysis appeared orange and turbid with ketones and protein present with trace of leukocyte esterase.  HOSPITAL COURSE:  Patient was admitted to [facility] for psychotic behavior.  Safety checks were obtained every 15 minutes.  Patient was to be redirected when he became confused.  Patient was started on Risperdal  q.a.m. and q.h.s.  Thiamine was ordered daily with a multivitamin ordered as well. Haldol was ordered on a p.r.n. basis for agitation and Citrucel was ordered for his irritable bowel.  Patient was ordered a Folstein Mini-Mental Status Examination and a CT scan was ordered.  CT scan was negative and blood work was totally normal.  Patient was remaining still perplexed and acting confused.  Patient was expressing extraordinary powers and was pantomiming to music.  The patient was sleeping poorly, remaining psychotic with an organic flavor.  The patient did have several seizures and felt that could have been withdrawal seizures.  A neuro consult was ordered.  Dr. Thad Ranger came to see the client with the impression of confusion that was probably due to his psychosis rather than encephalopathy.  There was some thought for organic etiologies that included alcoholic encephalopathy or hallucinations, syphilis, B12 deficiency or an interictal phenomenon.  Dr. Thad Ranger recommended thiamine replacement obtaining RPR and B12 levels and agreed with Depakote with ongoing management with psychiatric issues.  Depakote was ordered q.h.s. and Risperdal was increased.  Patient began to become fully oriented but with speech still remaining nonsensical.  Patient was recognizing different staff members and was becoming more aware that he was in the hospital setting.  Patient was becoming calm as his hospitalization progressed but was still remaining acutely psychotic.  He was sleeping fitfully and only a few hours.  He was pacing and remaining very agitated.  His thought disorder was persisting and was remaining acutely paranoid.  There was no suicidal ideas.  Patient was scheduled to have an EEG.  Patient was beginning to slowly improve.  Beginning to sleep better.  Becoming less agitated.  He began to show evidence of improved reality connectiveness but there was no evidence of akathisia, dyskinesia or EPS.   Patient was denying any hallucinations.  His behavior, though, remained inappropriate but was improving.  CONDITION ON DISCHARGE:  Confusion and thought disorder were slowly resolving.  Patient was showing evidence of improved reality connectiveness.  His affect was less perplexed and there was no evidence of oversedation.  He was optimally improved with psychosis resolved.  It was felt that patient could be managed on an outpatient basis.  FOLLOW-UP:  Patient was discharged to home and was to follow up with Dr. Jodi Marble.  Appointment date was provided to the patient with no other restrictions to diet or activity.  DISCHARGE MEDICATIONS: 1. Depakote ER 500 mg 1 q.a.m., 2 q.h.s. 2. Haldol 5 mg 1 t.i.d. 3. Benadryl 50 mg 1 t.i.d. 4. Risperdal 1 mg 1 q.a.m. and 2 q.h.s.  DISCHARGE DIAGNOSES: Axis I:    1. Psychotic disorder not otherwise specified.            2. Alcohol abuse. Axis II:   None. Axis III:  1. Irritable bowel syndrome.  2. Gastroesophageal reflux disease. Axis IV:   Moderate (related to financial issues and problems from primary            support group). Axis V:    Current Global Assessment of Functioning 55; this past year 75. DD:  07/16/00 TD:  07/16/00 Job: [id number]

## 2010-11-01 NOTE — Consult Note (Signed)
[facility]  Patient:    Nicolas Allen, Nicolas Allen                        MRN: [id number] Proc. Date: 05/15/00 Adm. Date:  [id number] Attending:  Marlyn Corporal Fabmy Dictator:   Kelli Hope, M.D.                          Consultation Report    DATE OF CONSULTATION:  May 15, 2000                       NEUROLOGY CONSULTATION REPORT  REFERRING PHYSICIAN:  Dub Amis, M.D.  REASON FOR EVALUATION:  Confusion, possible seizures.  HISTORY OF PRESENT ILLNESS:  This is the initial outpatient consult visit for this 55 year old  right-handed black man with no significant chronic medical or psychiatric problems, admitted to [facility] on November 28.  When I asked him why he is here, he says "Im going crazy."  Per the chart, he was admitted from [facility] on November 28, where he presented with a one-month history of worsening depressive symptoms, confusion, and staring spells.  His wife had said that he would wander around the house and when he talked he would make no sense.  He apparently has been working and it is unknown whether he is having any problems at work.  His wife indicates that he has been "staring off," but there has been no clear seizure activity to his admission.  While he has been here at [facility], he has been variably confused and agitated but is often fully oriented.  He was found at 1800 yesterday to have had 2 brief episodes of "shaking all 4 extremities and the head," with no associated tongue trauma, bowel or bladder incontinence, or clear post ictal state.  He had subsequently been started on Depakote ER for mood disorder and possible seizures.  He was transferred to [facility] ER for evaluation of the suspected seizures last night.  A CT of the head as done at that time and was interpreted as normal.  He also had a CBC and CMET which were completely unremarkable except for a mild  normocytic anemia.  Alcohol level was less than 5.  PAST MEDICAL HISTORY:  Gastroesophageal reflux disease and irritable bowel syndrome.  He denies any hospitalization or surgeries.  FAMILY MEDICAL HISTORY:  Unknown.  SOCIAL HISTORY:  Patient has been married for 22 years.  He has no children. He works at [employer].  He drinks about a six-pack a day.  He smokes 2 packs of cigarettes a day.  REVIEW OF SYSTEMS:  Per H&P.  MEDICATIONS:  Depakote ER 500 mg q.12, Ativan p.r.n., Risperdal .5 mg p.o. q.a.m. and 1 mg q.h.s., thiamine q.d., multivitamin q.d., Citrucel.  PHYSICAL EXAMINATION:  Vital signs:  Temperature 99.1, blood pressure 114-145/52-79, pulse 90, respirations 18.  GENERAL/MENTAL STATUS:  He is alert and in no evident distress.  His speech is fluent and not dysarthric.  He is oriented hospital in [place], [place] [facility] without the name of the  hospital.  He does not know the exact date or day of the week and when he is asked the season he says "Cold" and "around Thanksgiving."  He does know that it is November 2001.  He immediately registers 3 memory objects, recalls 2 of them after distraction. He is able to spell WORLD  backwards but cannot perform serial 7s.  He has no defects of computational naming and can follow a 3-step command.  He has a small repeating error, saying "no ifs, ands and buts,"  and his written sentence is "Write a sentence."  He can copy, intersecting pentagons and follow a written command.  His MMSE score is 23 by my exam.  He was calm throughout the interview and his behavior was appropriate, except that at one point he did take off his shirt.  He did occasionally stare off.  It is unclear whether he was thinking deeply or responding to internal stimuli during these times.  NEUROLOGIC EXAM:  Cranial nerves, funduscopic exam is benign.  Pupils are equal and briskly reactive, extraocular movements are normal without nystagmus.   Visual fields are full to confrontation.  Face, tongue and palate all move normally.  MOTOR EXAM:  Normal bunk and tone, no ______ articulations, normal strength in all tested extremity muscles.  Sensation is intact to light touch and vibations in all extremities.  Cerebellar:  Rapid alternating movements are normal.  Finger to nose is performed normally.  Gait is normal.  He is able to heel-toe and tandem walk.  Reflexes are 2+ and symmetric. Toes are downgoing.  LAB:  CBC, CMET, alcohol level, and CT of the head:  Reports of the above are reviewed and are normal.  IMPRESSION: 1. Confusion, probably primarily due to psychosis rather than encephalopathy,    particularly a possible psychotic depression.  Thought for organic    etiologies include an alcoholic encephalopathy/hallucinosis, syphilis,    B12 deficiency, or an inter-ictal phenomenon. 2. Question of seizures:  History is not convincing, but he may indeed be    having subclinical seizures contributing to his confusion.  RECOMMENDATIONS: 1. Agree with thiamine replacement. 2. Labs:  RPR and B12 levels. 3. Agree with EEG and empiric Depakote for now. 4. Ongoing management of psychiatric issues. 5. Will follow up after EEG. DD:  05/15/00 TD:  05/15/00 Job: [id number]

## 2010-11-01 NOTE — Consult Note (Signed)
[place]  Patient:    Nicolas Allen, Nicolas Allen                        MRN: [id number] Proc. Date: 05/15/00 Adm. Date:  [id number] Attending:  Beryle Beams CC:         Dub Amis, M.D.   Consultation Report  DATE OF BIRTH:  [birth year].  REQUESTING PHYSICIAN:  Dub Amis, M.D.  REASON FOR EVALUATION:  Confusion, possible seizures.  HISTORY OF PRESENT ILLNESS:  This is the initial outpatient consult visit for this 55 year old right handed black man with no significant chronic medical problems or psychiatric problems, admitted to [facility] on May 13, 2000.  When I asked him why he is here, he said "I am going crazy."  Per the chart, he was admitted from [facility] on May 13, 2000, where he presented with a one month history of worsening depressive symptoms, confusion and staring spells.  His wife would say he would wander around the house and when he would talk, he would make no sense.  He apparently has been working and DD:  05/15/00 TD:  05/15/00 Job: [id number]

## 2011-01-22 ENCOUNTER — Other Ambulatory Visit: Payer: Self-pay | Admitting: Internal Medicine

## 2011-02-07 ENCOUNTER — Telehealth: Payer: Self-pay

## 2011-02-07 ENCOUNTER — Other Ambulatory Visit (INDEPENDENT_AMBULATORY_CARE_PROVIDER_SITE_OTHER): Payer: Self-pay

## 2011-02-07 DIAGNOSIS — Z0000 Encounter for general adult medical examination without abnormal findings: Secondary | ICD-10-CM

## 2011-02-07 DIAGNOSIS — Z1289 Encounter for screening for malignant neoplasm of other sites: Secondary | ICD-10-CM

## 2011-02-07 LAB — BASIC METABOLIC PANEL
BUN: 11 mg/dL (ref 6–23)
CO2: 29 mEq/L (ref 19–32)
Calcium: 8.7 mg/dL (ref 8.4–10.5)
Chloride: 103 mEq/L (ref 96–112)
Creatinine, Ser: 1 mg/dL (ref 0.4–1.5)
GFR: 102.52 mL/min (ref >60.00)
Glucose, Bld: 76 mg/dL (ref 70–99)
Potassium: 4 mEq/L (ref 3.5–5.1)
Sodium: 139 mEq/L (ref 135–145)

## 2011-02-07 LAB — URINALYSIS, ROUTINE W REFLEX MICROSCOPIC
Bilirubin Urine: NEGATIVE
Hgb urine dipstick: NEGATIVE
Leukocytes, UA: NEGATIVE
Nitrite: NEGATIVE
Specific Gravity, Urine: >=1.03 (ref 1.000–1.030)
Total Protein, Urine: NEGATIVE
Urine Glucose: NEGATIVE
Urobilinogen, UA: 1 (ref 0.0–1.0)
pH: 5.5 (ref 5.0–8.0)

## 2011-02-07 LAB — CBC WITH DIFFERENTIAL/PLATELET
Basophils Absolute: 0 10*3/uL (ref 0.0–0.1)
Basophils Relative: 0.5 % (ref 0.0–3.0)
Eosinophils Absolute: 0.3 10*3/uL (ref 0.0–0.7)
Eosinophils Relative: 4.8 % (ref 0.0–5.0)
HCT: 38.5 % — ABNORMAL LOW (ref 39.0–52.0)
Hemoglobin: 12.7 g/dL — ABNORMAL LOW (ref 13.0–17.0)
Lymphocytes Relative: 43 % (ref 12.0–46.0)
Lymphs Abs: 2.7 10*3/uL (ref 0.7–4.0)
MCHC: 32.9 g/dL (ref 30.0–36.0)
MCV: 84.6 fl (ref 78.0–100.0)
Monocytes Absolute: 0.5 10*3/uL (ref 0.1–1.0)
Monocytes Relative: 8.3 % (ref 3.0–12.0)
Neutro Abs: 2.7 10*3/uL (ref 1.4–7.7)
Neutrophils Relative %: 43.4 % (ref 43.0–77.0)
Platelets: 147 10*3/uL — ABNORMAL LOW (ref 150.0–400.0)
RBC: 4.55 Mil/uL (ref 4.22–5.81)
RDW: 15.4 % — ABNORMAL HIGH (ref 11.5–14.6)
WBC: 6.2 10*3/uL (ref 4.5–10.5)

## 2011-02-07 LAB — HEPATIC FUNCTION PANEL
ALT: 12 U/L (ref 0–53)
AST: 22 U/L (ref 0–37)
Albumin: 3.8 g/dL (ref 3.5–5.2)
Alkaline Phosphatase: 54 U/L (ref 39–117)
Bilirubin, Direct: 0.1 mg/dL (ref 0.0–0.3)
Total Bilirubin: 0.6 mg/dL (ref 0.3–1.2)
Total Protein: 6.8 g/dL (ref 6.0–8.3)

## 2011-02-07 LAB — LIPID PANEL
Cholesterol: 151 mg/dL (ref 0–200)
HDL: 56.9 mg/dL (ref >39.00)
LDL Cholesterol: 84 mg/dL (ref 0–99)
Total CHOL/HDL Ratio: 3
Triglycerides: 51 mg/dL (ref 0.0–149.0)
VLDL: 10.2 mg/dL (ref 0.0–40.0)

## 2011-02-07 LAB — TSH: TSH: 0.86 u[IU]/mL (ref 0.35–5.50)

## 2011-02-07 LAB — PSA: PSA: 0.22 ng/mL (ref 0.10–4.00)

## 2011-02-07 NOTE — Telephone Encounter (Signed)
Put order in for labs. 

## 2011-02-11 ENCOUNTER — Encounter: Payer: Self-pay | Admitting: Internal Medicine

## 2011-02-11 ENCOUNTER — Ambulatory Visit (INDEPENDENT_AMBULATORY_CARE_PROVIDER_SITE_OTHER): Payer: 59 | Admitting: Internal Medicine

## 2011-02-11 VITALS — BP 110/64 | HR 78 | Temp 98.2°F | Ht 72.0 in | Wt 143.0 lb

## 2011-02-11 DIAGNOSIS — N529 Male erectile dysfunction, unspecified: Secondary | ICD-10-CM

## 2011-02-11 DIAGNOSIS — Z0001 Encounter for general adult medical examination with abnormal findings: Secondary | ICD-10-CM | POA: Insufficient documentation

## 2011-02-11 DIAGNOSIS — Z0000 Encounter for general adult medical examination without abnormal findings: Secondary | ICD-10-CM

## 2011-02-11 DIAGNOSIS — R7309 Other abnormal glucose: Secondary | ICD-10-CM

## 2011-02-11 DIAGNOSIS — R7302 Impaired glucose tolerance (oral): Secondary | ICD-10-CM

## 2011-02-11 HISTORY — DX: Male erectile dysfunction, unspecified: N52.9

## 2011-02-11 HISTORY — DX: Impaired glucose tolerance (oral): R73.02

## 2011-02-11 MED ORDER — AMLODIPINE BESY-BENAZEPRIL HCL 10-20 MG PO CAPS: 1.0000 | ORAL_CAPSULE | Freq: Every day | ORAL | Status: DC

## 2011-02-11 MED ORDER — VARDENAFIL HCL 20 MG PO TABS: 20.0000 mg | ORAL_TABLET | ORAL | Status: DC | PRN

## 2011-02-11 MED ORDER — PRAVASTATIN SODIUM 40 MG PO TABS: 40.0000 mg | ORAL_TABLET | Freq: Every day | ORAL | Status: DC

## 2011-02-11 NOTE — Progress Notes (Signed)
Subjective:    Patient ID: Nicolas Allen, male    DOB: 07-23-1955, 55 y.o.   MRN: [id number]  HPI Here for wellness and f/u;  Overall doing ok;  Pt denies CP, worsening SOB, DOE, wheezing, orthopnea, PND, worsening LE edema, palpitations, dizziness or syncope.  Pt denies neurological change such as new Headache, facial or extremity weakness.  Pt denies polydipsia, polyuria, or low sugar symptoms. Pt states overall good compliance with treatment and medications, good tolerability, and trying to follow lower cholesterol diet.  Pt denies worsening depressive symptoms, suicidal ideation or panic. No fever, wt loss, night sweats, loss of appetite, or other constitutional symptoms.  Pt states good ability with ADL's, low fall risk, home safety reviewed and adequate, no significant changes in hearing or vision, and occasionally active with exercise.  Does have some bilat shoulder aching after playing quite a bit of horseshoes over the past few wks Past Medical History  Diagnosis Date  . ANXIETY 02/03/2007  . GLUCOSE INTOLERANCE 01/04/2008  . HYPERLIPIDEMIA 02/03/2007  . HYPERTENSION 02/03/2007  . Impaired glucose tolerance 02/11/2011   No past surgical history on file.  reports that he has been smoking.  He does not have any smokeless tobacco history on file. He reports that he does not drink alcohol or use illicit drugs. family history includes Cancer in his cousin and mother; Hypertension in his other; and Stroke in his other. No Known Allergies Current Outpatient Prescriptions on File Prior to Visit  Medication Sig Dispense Refill  . aspirin 81 MG tablet Take 81 mg by mouth daily.        Belva Agee CAPS Take by mouth daily.        . Omega-3 Fatty Acids (FISH OIL) 1000 MG CAPS Take by mouth 2 (two) times daily.         Review of Systems Review of Systems  Constitutional: Negative for diaphoresis, activity change, appetite change and unexpected weight change.  HENT: Negative for hearing loss,  ear pain, facial swelling, mouth sores and neck stiffness.   Eyes: Negative for pain, redness and visual disturbance.  Respiratory: Negative for shortness of breath and wheezing.   Cardiovascular: Negative for chest pain and palpitations.  Gastrointestinal: Negative for diarrhea, blood in stool, abdominal distention and rectal pain.  Genitourinary: Negative for hematuria, flank pain and decreased urine volume.  Musculoskeletal: Negative for myalgias and joint swelling.  Skin: Negative for color change and wound.  Neurological: Negative for syncope and numbness.  Hematological: Negative for adenopathy.  Psychiatric/Behavioral: Negative for hallucinations, self-injury, decreased concentration and agitation.      Objective:   Physical Exam BP 110/64  Pulse 78  Temp(Src) 98.2 F (36.8 C) (Oral)  Ht 6' (1.829 m)  Wt 143 lb (64.864 kg)  BMI 19.39 kg/m2  SpO2 98% Physical Exam  VS noted Constitutional: Pt is oriented to person, place, and time. Appears well-developed and well-nourished.  HENT:  Head: Normocephalic and atraumatic.  Right Ear: External ear normal.  Left Ear: External ear normal.  Nose: Nose normal.   Mouth/Throat: Oropharynx is clear and moist. Fair dentition Eyes: Conjunctivae and EOM are normal. Pupils are equal, round, and reactive to light.  Neck: Normal range of motion. Neck supple. No JVD present. No tracheal deviation present.  Cardiovascular: Normal rate, regular rhythm, normal heart sounds and intact distal pulses.   Pulmonary/Chest: Effort normal and breath sounds normal.  Abdominal: Soft. Bowel sounds are normal. There is no tenderness.  Musculoskeletal: Normal range of motion.  Exhibits no edema. Shoulders bilat with FROM, NT Lymphadenopathy:  Has no cervical adenopathy.  Neurological: Pt is alert and oriented to person, place, and time. Pt has normal reflexes. No cranial nerve deficit.  Skin: Skin is warm and dry. No rash noted.  Psychiatric:  Has  normal  mood and affect. Behavior is normal.         Assessment & Plan:

## 2011-02-11 NOTE — Patient Instructions (Signed)
Take all new medications as prescribed - the levitra Please call if you want to change the levitra back to the viagra for any reason Continue all other medications as before Please return in 1 year for your yearly visit, or sooner if needed, with Lab testing done 3-5 days before

## 2011-02-11 NOTE — Assessment & Plan Note (Signed)
viagra too expensive ; to try levitra asd,  to f/u any worsening symptoms or concerns j

## 2011-02-11 NOTE — Assessment & Plan Note (Signed)

## 2011-03-07 ENCOUNTER — Other Ambulatory Visit: Payer: Self-pay

## 2011-03-07 MED ORDER — AMLODIPINE BESY-BENAZEPRIL HCL 10-20 MG PO CAPS: 1.0000 | ORAL_CAPSULE | Freq: Every day | ORAL | Status: DC

## 2011-03-07 MED ORDER — PRAVASTATIN SODIUM 40 MG PO TABS: 40.0000 mg | ORAL_TABLET | Freq: Every day | ORAL | Status: DC

## 2011-03-07 MED ORDER — VARDENAFIL HCL 20 MG PO TABS: 20.0000 mg | ORAL_TABLET | ORAL | Status: DC | PRN

## 2011-06-16 ENCOUNTER — Other Ambulatory Visit: Payer: Self-pay | Admitting: Orthopedic Surgery

## 2011-06-19 ENCOUNTER — Encounter (HOSPITAL_COMMUNITY): Payer: Self-pay | Admitting: Pharmacy Technician

## 2011-06-27 ENCOUNTER — Ambulatory Visit (HOSPITAL_COMMUNITY)
Admission: RE | Admit: 2011-06-27 | Discharge: 2011-06-27 | Disposition: A | Discharge status: Home / Self Care | Payer: 59 | Source: Ambulatory Visit | Attending: Orthopedic Surgery | Admitting: Orthopedic Surgery

## 2011-06-27 ENCOUNTER — Encounter (HOSPITAL_COMMUNITY)
Admission: RE | Admit: 2011-06-27 | Discharge: 2011-06-27 | Disposition: A | Discharge status: Home / Self Care | Payer: 59 | Source: Ambulatory Visit | Attending: Orthopedic Surgery | Admitting: Orthopedic Surgery

## 2011-06-27 ENCOUNTER — Encounter (HOSPITAL_COMMUNITY): Payer: Self-pay

## 2011-06-27 DIAGNOSIS — Z01812 Encounter for preprocedural laboratory examination: Secondary | ICD-10-CM | POA: Insufficient documentation

## 2011-06-27 DIAGNOSIS — I10 Essential (primary) hypertension: Secondary | ICD-10-CM | POA: Insufficient documentation

## 2011-06-27 DIAGNOSIS — Z01818 Encounter for other preprocedural examination: Secondary | ICD-10-CM | POA: Insufficient documentation

## 2011-06-27 DIAGNOSIS — Z87891 Personal history of nicotine dependence: Secondary | ICD-10-CM | POA: Insufficient documentation

## 2011-06-27 HISTORY — DX: Gastro-esophageal reflux disease without esophagitis: K21.9

## 2011-06-27 HISTORY — DX: Irritable bowel syndrome, unspecified: K58.9

## 2011-06-27 LAB — URINALYSIS, ROUTINE W REFLEX MICROSCOPIC
Bilirubin Urine: NEGATIVE
Glucose, UA: NEGATIVE mg/dL
Hgb urine dipstick: NEGATIVE
Ketones, ur: NEGATIVE mg/dL
Leukocytes, UA: NEGATIVE
Nitrite: NEGATIVE
Protein, ur: NEGATIVE mg/dL
Specific Gravity, Urine: 1.01 (ref 1.005–1.030)
Urobilinogen, UA: 0.2 mg/dL (ref 0.0–1.0)
pH: 5.5 (ref 5.0–8.0)

## 2011-06-27 LAB — DIFFERENTIAL
Basophils Absolute: 0 10*3/uL (ref 0.0–0.1)
Basophils Relative: 1 % (ref 0–1)
Eosinophils Absolute: 0.2 10*3/uL (ref 0.0–0.7)
Eosinophils Relative: 3 % (ref 0–5)
Lymphocytes Relative: 38 % (ref 12–46)
Lymphs Abs: 2.5 10*3/uL (ref 0.7–4.0)
Monocytes Absolute: 0.7 10*3/uL (ref 0.1–1.0)
Monocytes Relative: 10 % (ref 3–12)
Neutro Abs: 3.2 10*3/uL (ref 1.7–7.7)
Neutrophils Relative %: 48 % (ref 43–77)

## 2011-06-27 LAB — TYPE AND SCREEN
ABO/RH(D): O POS
Antibody Screen: NEGATIVE

## 2011-06-27 LAB — COMPREHENSIVE METABOLIC PANEL
ALT: 8 U/L (ref 0–53)
AST: 16 U/L (ref 0–37)
Albumin: 3.7 g/dL (ref 3.5–5.2)
Alkaline Phosphatase: 53 U/L (ref 39–117)
BUN: 12 mg/dL (ref 6–23)
CO2: 27 mEq/L (ref 19–32)
Calcium: 9.6 mg/dL (ref 8.4–10.5)
Chloride: 101 mEq/L (ref 96–112)
Creatinine, Ser: 0.91 mg/dL (ref 0.50–1.35)
GFR calc Af Amer: >90 mL/min (ref >90)
GFR calc non Af Amer: >90 mL/min (ref >90)
Glucose, Bld: 66 mg/dL — ABNORMAL LOW (ref 70–99)
Potassium: 4.3 mEq/L (ref 3.5–5.1)
Sodium: 138 mEq/L (ref 135–145)
Total Bilirubin: 0.2 mg/dL — ABNORMAL LOW (ref 0.3–1.2)
Total Protein: 7.5 g/dL (ref 6.0–8.3)

## 2011-06-27 LAB — CBC
HCT: 40.2 % (ref 39.0–52.0)
Hemoglobin: 14.1 g/dL (ref 13.0–17.0)
MCH: 27.8 pg (ref 26.0–34.0)
MCHC: 35.1 g/dL (ref 30.0–36.0)
MCV: 79.1 fL (ref 78.0–100.0)
Platelets: 164 10*3/uL (ref 150–400)
RBC: 5.08 MIL/uL (ref 4.22–5.81)
RDW: 14.7 % (ref 11.5–15.5)
WBC: 6.6 10*3/uL (ref 4.0–10.5)

## 2011-06-27 LAB — SURGICAL PCR SCREEN
MRSA, PCR: NEGATIVE
Staphylococcus aureus: NEGATIVE

## 2011-06-27 LAB — PROTIME-INR
INR: 0.98 (ref 0.00–1.49)
Prothrombin Time: 13.2 seconds (ref 11.6–15.2)

## 2011-06-27 LAB — ABO/RH: ABO/RH(D): O POS

## 2011-06-27 LAB — APTT: aPTT: 32 seconds (ref 24–37)

## 2011-06-27 NOTE — Pre-Procedure Instructions (Signed)
Nicolas Allen  06/27/2011   Your procedure is scheduled on: 07-03-2011 @ 0730 AM Report to [facility] at 5:30 AM.  Call this number if you have problems the morning of surgery: [phone]   Remember:   Do not eat food:After Midnight.  May have clear liquids: up to 4 Hours before arrival.  Clear liquids include soda, tea, black coffee, apple or grape juice, broth. Until 1:30 AM  Take these medicines the morning of surgery with A SIP OF WATER none   Do not wear jewelry, make-up or nail polish.  Do not wear lotions, powders, or perfumes. You may wear deodorant.  Do not shave 48 hours prior to surgery.  Do not bring valuables to the hospital.  Contacts, dentures or bridgework may not be worn into surgery.  Leave suitcase in the car. After surgery it may be brought to your room.  For patients admitted to the hospital, checkout time is 11:00 AM the day of discharge.       Special Instructions: CHG Shower Use Special Wash: 1/2 bottle night before surgery and 1/2 bottle morning of surgery.   Please read over the following fact sheets that you were given: Blood Transfusion Information, MRSA Information and Surgical Site Infection Prevention

## 2011-07-02 MED ORDER — POVIDONE-IODINE 7.5 % EX SOLN
Freq: Once | CUTANEOUS | Status: DC
  Filled 2011-07-02: qty 118

## 2011-07-02 MED ORDER — CEFAZOLIN SODIUM-DEXTROSE 2-3 GM-% IV SOLR
2.0000 g | INTRAVENOUS | Status: AC
  Administered 2011-07-03: 2 g via INTRAVENOUS
  Filled 2011-07-02: qty 50

## 2011-07-03 ENCOUNTER — Encounter (HOSPITAL_COMMUNITY): Payer: Self-pay | Admitting: *Deleted

## 2011-07-03 ENCOUNTER — Inpatient Hospital Stay (HOSPITAL_COMMUNITY)
Admission: RE | Admit: 2011-07-03 | Discharge: 2011-07-04 | DRG: 473 | Disposition: A | Discharge status: Home / Self Care | Payer: 59 | Source: Ambulatory Visit | Attending: Orthopedic Surgery | Admitting: Orthopedic Surgery

## 2011-07-03 ENCOUNTER — Ambulatory Visit (HOSPITAL_COMMUNITY): Payer: 59

## 2011-07-03 ENCOUNTER — Ambulatory Visit (HOSPITAL_COMMUNITY): Payer: 59 | Admitting: *Deleted

## 2011-07-03 ENCOUNTER — Encounter (HOSPITAL_COMMUNITY)
Admission: RE | Disposition: A | Discharge status: Home / Self Care | Payer: Self-pay | Source: Ambulatory Visit | Attending: Orthopedic Surgery

## 2011-07-03 DIAGNOSIS — E785 Hyperlipidemia, unspecified: Secondary | ICD-10-CM | POA: Diagnosis present

## 2011-07-03 DIAGNOSIS — I10 Essential (primary) hypertension: Secondary | ICD-10-CM | POA: Diagnosis present

## 2011-07-03 DIAGNOSIS — K219 Gastro-esophageal reflux disease without esophagitis: Secondary | ICD-10-CM | POA: Diagnosis present

## 2011-07-03 DIAGNOSIS — K589 Irritable bowel syndrome without diarrhea: Secondary | ICD-10-CM | POA: Diagnosis present

## 2011-07-03 DIAGNOSIS — F411 Generalized anxiety disorder: Secondary | ICD-10-CM | POA: Diagnosis present

## 2011-07-03 DIAGNOSIS — M5412 Radiculopathy, cervical region: Secondary | ICD-10-CM

## 2011-07-03 DIAGNOSIS — R7309 Other abnormal glucose: Secondary | ICD-10-CM | POA: Diagnosis present

## 2011-07-03 DIAGNOSIS — F17200 Nicotine dependence, unspecified, uncomplicated: Secondary | ICD-10-CM | POA: Diagnosis present

## 2011-07-03 DIAGNOSIS — M5030 Other cervical disc degeneration, unspecified cervical region: Principal | ICD-10-CM | POA: Diagnosis present

## 2011-07-03 SURGERY — ANTERIOR CERVICAL DECOMPRESSION/DISCECTOMY FUSION 4 LEVELS
Anesthesia: General | Site: Spine Cervical | Laterality: Left | Wound class: Clean

## 2011-07-03 MED ORDER — BUPIVACAINE-EPINEPHRINE 0.25% -1:200000 IJ SOLN
INTRAMUSCULAR | Status: DC | PRN
  Administered 2011-07-03: 20 mL

## 2011-07-03 MED ORDER — SODIUM CHLORIDE 0.9 % IJ SOLN: 3.0000 mL | INTRAMUSCULAR | Status: DC | PRN

## 2011-07-03 MED ORDER — LIDOCAINE HCL 4 % MT SOLN
OROMUCOSAL | Status: DC | PRN
  Administered 2011-07-03: 4 mL via TOPICAL

## 2011-07-03 MED ORDER — ZOLPIDEM TARTRATE 5 MG PO TABS: 5.0000 mg | ORAL_TABLET | Freq: Every evening | ORAL | Status: DC | PRN

## 2011-07-03 MED ORDER — POTASSIUM CHLORIDE IN NACL 20-0.9 MEQ/L-% IV SOLN
INTRAVENOUS | Status: DC
  Administered 2011-07-03: 13:00:00 via INTRAVENOUS
  Filled 2011-07-03 (×3): qty 1000

## 2011-07-03 MED ORDER — ONDANSETRON HCL 4 MG/2ML IJ SOLN: 4.0000 mg | Freq: Once | INTRAMUSCULAR | Status: AC | PRN

## 2011-07-03 MED ORDER — DIAZEPAM 5 MG PO TABS
5.0000 mg | ORAL_TABLET | Freq: Four times a day (QID) | ORAL | Status: DC | PRN
  Administered 2011-07-03 (×2): 5 mg via ORAL
  Filled 2011-07-03 (×2): qty 1

## 2011-07-03 MED ORDER — ONDANSETRON HCL 4 MG/2ML IJ SOLN
INTRAMUSCULAR | Status: DC | PRN
  Administered 2011-07-03: 4 mg via INTRAVENOUS

## 2011-07-03 MED ORDER — MENTHOL 3 MG MT LOZG
1.0000 | LOZENGE | OROMUCOSAL | Status: DC | PRN
  Administered 2011-07-03: 3 mg via ORAL
  Filled 2011-07-03: qty 9

## 2011-07-03 MED ORDER — FENTANYL CITRATE 0.05 MG/ML IJ SOLN
INTRAMUSCULAR | Status: DC | PRN
  Administered 2011-07-03 (×2): 100 ug via INTRAVENOUS
  Administered 2011-07-03: 50 ug via INTRAVENOUS

## 2011-07-03 MED ORDER — CEFAZOLIN SODIUM 1-5 GM-% IV SOLN
1.0000 g | Freq: Three times a day (TID) | INTRAVENOUS | Status: AC
  Administered 2011-07-03 (×2): 1 g via INTRAVENOUS
  Filled 2011-07-03 (×2): qty 50

## 2011-07-03 MED ORDER — SODIUM CHLORIDE 0.9 % IJ SOLN
3.0000 mL | Freq: Two times a day (BID) | INTRAMUSCULAR | Status: DC
  Administered 2011-07-03 (×2): 3 mL via INTRAVENOUS

## 2011-07-03 MED ORDER — HYDROMORPHONE HCL PF 1 MG/ML IJ SOLN
0.2500 mg | INTRAMUSCULAR | Status: DC | PRN
  Administered 2011-07-03 (×3): 0.5 mg via INTRAVENOUS

## 2011-07-03 MED ORDER — HYDROMORPHONE HCL 2 MG PO TABS
1.0000 mg | ORAL_TABLET | ORAL | Status: DC | PRN
  Administered 2011-07-04: 1 mg via ORAL
  Filled 2011-07-03: qty 1

## 2011-07-03 MED ORDER — SIMVASTATIN 20 MG PO TABS
20.0000 mg | ORAL_TABLET | Freq: Every day | ORAL | Status: DC
  Administered 2011-07-03: 20 mg via ORAL
  Filled 2011-07-03 (×2): qty 1

## 2011-07-03 MED ORDER — PROPOFOL 10 MG/ML IV EMUL
INTRAVENOUS | Status: DC | PRN
  Administered 2011-07-03: 200 mg via INTRAVENOUS

## 2011-07-03 MED ORDER — HETASTARCH-ELECTROLYTES 6 % IV SOLN
INTRAVENOUS | Status: DC | PRN
  Administered 2011-07-03: 08:00:00 via INTRAVENOUS

## 2011-07-03 MED ORDER — PHENOL 1.4 % MT LIQD
1.0000 | OROMUCOSAL | Status: DC | PRN
  Administered 2011-07-03: 1 via OROMUCOSAL
  Filled 2011-07-03: qty 177

## 2011-07-03 MED ORDER — PHENYLEPHRINE HCL 10 MG/ML IJ SOLN
INTRAMUSCULAR | Status: DC | PRN
  Administered 2011-07-03 (×2): 40 ug via INTRAVENOUS
  Administered 2011-07-03: 80 ug via INTRAVENOUS
  Administered 2011-07-03 (×6): 40 ug via INTRAVENOUS

## 2011-07-03 MED ORDER — DEXAMETHASONE SODIUM PHOSPHATE 4 MG/ML IJ SOLN
INTRAMUSCULAR | Status: DC | PRN
  Administered 2011-07-03: 4 mg via INTRAVENOUS

## 2011-07-03 MED ORDER — ACETAMINOPHEN 325 MG PO TABS: 650.0000 mg | ORAL_TABLET | ORAL | Status: DC | PRN

## 2011-07-03 MED ORDER — HYDROMORPHONE HCL PF 1 MG/ML IJ SOLN
INTRAMUSCULAR | Status: AC
  Filled 2011-07-03: qty 1

## 2011-07-03 MED ORDER — NEOSTIGMINE METHYLSULFATE 1 MG/ML IJ SOLN
INTRAMUSCULAR | Status: DC | PRN
  Administered 2011-07-03: 3 mg via INTRAVENOUS

## 2011-07-03 MED ORDER — GLYCOPYRROLATE 0.2 MG/ML IJ SOLN
INTRAMUSCULAR | Status: DC | PRN
  Administered 2011-07-03: .5 mg via INTRAVENOUS

## 2011-07-03 MED ORDER — LACTATED RINGERS IV SOLN
INTRAVENOUS | Status: DC | PRN
  Administered 2011-07-03: 07:00:00 via INTRAVENOUS

## 2011-07-03 MED ORDER — AMLODIPINE BESYLATE 10 MG PO TABS
10.0000 mg | ORAL_TABLET | Freq: Every day | ORAL | Status: DC
  Administered 2011-07-03 – 2011-07-04 (×2): 10 mg via ORAL
  Filled 2011-07-03 (×2): qty 1

## 2011-07-03 MED ORDER — MORPHINE SULFATE 4 MG/ML IJ SOLN
2.0000 mg | INTRAMUSCULAR | Status: DC | PRN
  Administered 2011-07-03 – 2011-07-04 (×4): 2 mg via INTRAVENOUS
  Filled 2011-07-03 (×4): qty 1

## 2011-07-03 MED ORDER — HYDROXYZINE HCL 50 MG PO TABS
50.0000 mg | ORAL_TABLET | ORAL | Status: DC | PRN
  Filled 2011-07-03: qty 1

## 2011-07-03 MED ORDER — HYDROXYZINE HCL 50 MG/ML IM SOLN: 50.0000 mg | INTRAMUSCULAR | Status: DC | PRN

## 2011-07-03 MED ORDER — MIDAZOLAM HCL 5 MG/5ML IJ SOLN
INTRAMUSCULAR | Status: DC | PRN
  Administered 2011-07-03: 2 mg via INTRAVENOUS

## 2011-07-03 MED ORDER — MORPHINE SULFATE 10 MG/ML IJ SOLN
INTRAMUSCULAR | Status: DC | PRN
  Administered 2011-07-03: 5 mg via INTRAVENOUS
  Administered 2011-07-03: 2 mg via INTRAVENOUS
  Administered 2011-07-03: 3 mg via INTRAVENOUS

## 2011-07-03 MED ORDER — ACETAMINOPHEN 650 MG RE SUPP: 650.0000 mg | RECTAL | Status: DC | PRN

## 2011-07-03 MED ORDER — THROMBIN 20000 UNITS EX KIT
PACK | CUTANEOUS | Status: DC | PRN
  Administered 2011-07-03: 09:00:00 via TOPICAL

## 2011-07-03 MED ORDER — LACTATED RINGERS IV SOLN
INTRAVENOUS | Status: DC | PRN
  Administered 2011-07-03 (×2): via INTRAVENOUS

## 2011-07-03 MED ORDER — LIDOCAINE HCL (CARDIAC) 20 MG/ML IV SOLN
INTRAVENOUS | Status: DC | PRN
  Administered 2011-07-03: 100 mg via INTRAVENOUS

## 2011-07-03 MED ORDER — VECURONIUM BROMIDE 10 MG IV SOLR
INTRAVENOUS | Status: DC | PRN
  Administered 2011-07-03: 3 mg via INTRAVENOUS
  Administered 2011-07-03: 1 mg via INTRAVENOUS

## 2011-07-03 MED ORDER — ROCURONIUM BROMIDE 100 MG/10ML IV SOLN
INTRAVENOUS | Status: DC | PRN
  Administered 2011-07-03: 50 mg via INTRAVENOUS

## 2011-07-03 SURGICAL SUPPLY — 71 items
14MM RETAINER SCREWS ×2 IMPLANT
APL SKNCLS STERI-STRIP NONHPOA (GAUZE/BANDAGES/DRESSINGS) ×1
BENZOIN TINCTURE PRP APPL 2/3 (GAUZE/BANDAGES/DRESSINGS) ×2 IMPLANT
BIT DRILL NEURO 2X3.1 SFT TUCH (MISCELLANEOUS) ×1 IMPLANT
BLADE LONG MED 31X9 (MISCELLANEOUS) IMPLANT
BLADE SURG 15 STRL LF DISP TIS (BLADE) ×1 IMPLANT
BLADE SURG 15 STRL SS (BLADE) ×2
BLADE SURG ROTATE 9660 (MISCELLANEOUS) ×1 IMPLANT
BUR NEURO DRILL SOFT 3.0X3.8M (BURR) ×2 IMPLANT
CARTRIDGE OIL MAESTRO DRILL (MISCELLANEOUS) ×1 IMPLANT
CLOTH BEACON ORANGE TIMEOUT ST (SAFETY) ×2 IMPLANT
COLLAR CERV LO CONTOUR FIRM DE (SOFTGOODS) IMPLANT
CORDS BIPOLAR (ELECTRODE) ×2 IMPLANT
COVER SURGICAL LIGHT HANDLE (MISCELLANEOUS) ×2 IMPLANT
CRADLE DONUT ADULT HEAD (MISCELLANEOUS) ×2 IMPLANT
DIFFUSER DRILL AIR PNEUMATIC (MISCELLANEOUS) ×2 IMPLANT
DRAIN JACKSON RD 7FR 3/32 (WOUND CARE) ×2 IMPLANT
DRAPE C-ARM 42X72 X-RAY (DRAPES) ×2 IMPLANT
DRAPE POUCH INSTRU U-SHP 10X18 (DRAPES) ×2 IMPLANT
DRAPE SURG 17X23 STRL (DRAPES) ×6 IMPLANT
DRILL NEURO 2X3.1 SOFT TOUCH (MISCELLANEOUS)
DURAPREP 6ML APPLICATOR 50/CS (WOUND CARE) ×2 IMPLANT
ELECT COATED BLADE 2.86 ST (ELECTRODE) ×2 IMPLANT
ELECT REM PT RETURN 9FT ADLT (ELECTROSURGICAL) ×2
ELECTRODE REM PT RTRN 9FT ADLT (ELECTROSURGICAL) ×1 IMPLANT
EVACUATOR SILICONE 100CC (DRAIN) ×1 IMPLANT
GAUZE SPONGE 4X4 12PLY STRL LF (GAUZE/BANDAGES/DRESSINGS) ×1 IMPLANT
GAUZE SPONGE 4X4 16PLY XRAY LF (GAUZE/BANDAGES/DRESSINGS) ×2 IMPLANT
GLOVE BIO SURGEON STRL SZ8 (GLOVE) ×2 IMPLANT
GLOVE BIOGEL PI IND STRL 8 (GLOVE) ×1 IMPLANT
GLOVE BIOGEL PI INDICATOR 8 (GLOVE) ×1
GOWN PREVENTION PLUS XLARGE (GOWN DISPOSABLE) ×3 IMPLANT
GOWN STRL NON-REIN LRG LVL3 (GOWN DISPOSABLE) ×2 IMPLANT
IV CATH 14GX2 1/4 (CATHETERS) ×2 IMPLANT
KIT BASIN OR (CUSTOM PROCEDURE TRAY) ×2 IMPLANT
KIT ROOM TURNOVER OR (KITS) ×2 IMPLANT
MANIFOLD NEPTUNE II (INSTRUMENTS) ×1 IMPLANT
NDL SPNL 20GX3.5 QUINCKE YW (NEEDLE) ×1 IMPLANT
NEEDLE 27GAX1X1/2 (NEEDLE) ×2 IMPLANT
NEEDLE SPNL 20GX3.5 QUINCKE YW (NEEDLE) ×2 IMPLANT
NS IRRIG 1000ML POUR BTL (IV SOLUTION) ×2 IMPLANT
OIL CARTRIDGE MAESTRO DRILL (MISCELLANEOUS) ×2
PACK ORTHO CERVICAL (CUSTOM PROCEDURE TRAY) ×2 IMPLANT
PAD ARMBOARD 7.5X6 YLW CONV (MISCELLANEOUS) ×4 IMPLANT
PATTIES SURGICAL .5 X.5 (GAUZE/BANDAGES/DRESSINGS) ×1 IMPLANT
PATTIES SURGICAL .5 X1 (DISPOSABLE) ×1 IMPLANT
PIN RETAINER PRODISC 14 MM (PIN) ×6 IMPLANT
PLATE VECTRA 28MM (Plate) ×1 IMPLANT
PUTTY BONE DBX 2.5 MIS (Bone Implant) ×1 IMPLANT
SCREW 4.0X16MM (Screw) ×6 IMPLANT
SPONGE GAUZE 4X4 12PLY (GAUZE/BANDAGES/DRESSINGS) ×2 IMPLANT
SPONGE INTESTINAL PEANUT (DISPOSABLE) ×4 IMPLANT
SPONGE SURGIFOAM ABS GEL 100 (HEMOSTASIS) ×1 IMPLANT
STRIP CLOSURE SKIN 1/2X4 (GAUZE/BANDAGES/DRESSINGS) ×2 IMPLANT
SURGIFLO TRUKIT (HEMOSTASIS) IMPLANT
SUT ETHILON 3 0 FSL (SUTURE) IMPLANT
SUT MNCRL AB 4-0 PS2 18 (SUTURE) ×1 IMPLANT
SUT PROLENE 3 0 PS 2 (SUTURE) IMPLANT
SUT SILK 4 0 (SUTURE)
SUT SILK 4-0 18XBRD TIE 12 (SUTURE) IMPLANT
SUT VIC AB 2-0 CT2 18 VCP726D (SUTURE) ×2 IMPLANT
SYR BULB IRRIGATION 50ML (SYRINGE) ×2 IMPLANT
SYR CONTROL 10ML LL (SYRINGE) ×2 IMPLANT
TAPE CLOTH 4X10 WHT NS (GAUZE/BANDAGES/DRESSINGS) ×1 IMPLANT
TAPE CLOTH SURG 4X10 WHT LF (GAUZE/BANDAGES/DRESSINGS) ×1 IMPLANT
TAPE UMBILICAL COTTON 1/8X30 (MISCELLANEOUS) ×4 IMPLANT
TOWEL OR 17X24 6PK STRL BLUE (TOWEL DISPOSABLE) ×2 IMPLANT
TOWEL OR 17X26 10 PK STRL BLUE (TOWEL DISPOSABLE) ×2 IMPLANT
TRAY FOLEY CATH 14FR (SET/KITS/TRAYS/PACK) ×2 IMPLANT
WATER STERILE IRR 1000ML POUR (IV SOLUTION) ×1 IMPLANT
YANKAUER SUCT BULB TIP NO VENT (SUCTIONS) ×2 IMPLANT

## 2011-07-03 NOTE — Plan of Care (Signed)
Problem: Consults Goal: Diagnosis - Spinal Surgery Outcome: Completed/Met Date Met:  07/03/11 Cervical Spine Fusion     

## 2011-07-03 NOTE — Progress Notes (Signed)
Kathie Dike notified for med rec. update

## 2011-07-03 NOTE — Progress Notes (Signed)
Orthopedic Tech Progress Note Patient Details:  Nicolas Allen 1956/03/14 [id number]  Other Ortho Devices Type of Ortho Device: Clavicle strap Ortho Device Location: philly collar Ortho Device Interventions: Sarita Bottom 07/03/2011, 1:48 PM

## 2011-07-03 NOTE — Anesthesia Postprocedure Evaluation (Signed)
  Anesthesia Post-op Note  Patient: Nicolas Allen  Procedure(s) Performed:  ANTERIOR CERVICAL DECOMPRESSION/DISCECTOMY FUSION 4 LEVELS - Anterior cervical decompression fusion, cervial 5-6, cervical 6-7 with instrumentation and allograft.  Patient Location: PACU  Anesthesia Type: General  Level of Consciousness: awake, oriented, sedated and patient cooperative  Airway and Oxygen Therapy: Patient Spontanous Breathing and Patient connected to face mask oxygen  Post-op Pain: mild  Post-op Assessment: Post-op Vital signs reviewed, Patient's Cardiovascular Status Stable, Respiratory Function Stable, Patent Airway, No signs of Nausea or vomiting and Pain level controlled  Post-op Vital Signs: stable  Complications: No apparent anesthesia complications

## 2011-07-03 NOTE — Anesthesia Preprocedure Evaluation (Addendum)
Anesthesia Evaluation  Patient identified by MRN, date of birth, ID band Patient awake    Reviewed: Allergy & Precautions, H&P , NPO status , Patient's Chart, lab work & pertinent test results  History of Anesthesia Complications (+) DIFFICULT AIRWAY  Airway Mallampati: I TM Distance: >3 FB Neck ROM: Limited    Dental  (+) Partial Lower, Partial Upper and Dental Advisory Given   Pulmonary Current Smoker,    Pulmonary exam normal       Cardiovascular hypertension, Pt. on medications Regular Normal    Neuro/Psych PSYCHIATRIC DISORDERS Anxiety Negative Neurological ROS     GI/Hepatic Neg liver ROS, GERD-  ,  Endo/Other  Negative Endocrine ROS  Renal/GU negative Renal ROS  Genitourinary negative   Musculoskeletal   Abdominal Normal abdominal exam  (+)   Peds  Hematology negative hematology ROS (+)   Anesthesia Other Findings   Reproductive/Obstetrics negative OB ROS                          Anesthesia Physical Anesthesia Plan  ASA: II  Anesthesia Plan: General   Post-op Pain Management:    Induction: Intravenous  Airway Management Planned: Oral ETT  Additional Equipment:   Intra-op Plan:   Post-operative Plan: Extubation in OR  Informed Consent: I have reviewed the patients History and Physical, chart, labs and discussed the procedure including the risks, benefits and alternatives for the proposed anesthesia with the patient or authorized representative who has indicated his/her understanding and acceptance.   Dental advisory given  Plan Discussed with: Anesthesiologist, Surgeon and CRNA  Anesthesia Plan Comments:        Anesthesia Quick Evaluation

## 2011-07-03 NOTE — H&P (Signed)
PREOPERATIVE H&P  Chief Complaint: left arm pain  HPI: Nicolas Allen is a 56 y.o. male who presents for with left arm pain  Past Medical History  Diagnosis Date  . ANXIETY 02/03/2007  . GLUCOSE INTOLERANCE 01/04/2008  . HYPERLIPIDEMIA 02/03/2007  . HYPERTENSION 02/03/2007  . Impaired glucose tolerance 02/11/2011  . Erectile dysfunction 02/11/2011  . GERD (gastroesophageal reflux disease)   . IBS (irritable bowel syndrome)    Past Surgical History  Procedure Date  . No past surgeries    History   Social History  . Marital Status: Married    Spouse Name: N/A    Number of Children: N/A  . Years of Education: N/A   Occupational History  . janitor    Social History Main Topics  . Smoking status: Current Everyday Smoker -- 1.5 packs/day for 30 years    Types: Cigarettes  . Smokeless tobacco: None  . Alcohol Use: No     quit 2000 prior heavy  . Drug Use: No  . Sexually Active: None   Other Topics Concern  . None   Social History Narrative  . None   Family History  Problem Relation Age of Onset  . Cancer Mother     esophagus  . Cancer Cousin   . Stroke Other   . Hypertension Other    No Known Allergies Prior to Admission medications   Medication Sig Start Date End Date Taking? Authorizing Provider  amLODipine-benazepril (LOTREL) 10-20 MG per capsule Take 1 capsule by mouth daily.   03/07/11  Yes Oliver Barre, MD  aspirin 81 MG tablet Take 81 mg by mouth daily.    Yes Historical Provider, MD  Garlic-Parsley CAPS Take 1 capsule by mouth daily.    Yes Historical Provider, MD  HYDROcodone-acetaminophen (VICODIN) 5-500 MG per tablet Take 1 tablet by mouth every 6 (six) hours as needed.   Yes Historical Provider, MD  Omega-3 Fatty Acids (FISH OIL) 1000 MG CAPS Take 3 capsules by mouth daily.    Yes Historical Provider, MD  pravastatin (PRAVACHOL) 40 MG tablet Take 40 mg by mouth daily.   03/07/11  Yes Oliver Barre, MD     All other systems have been reviewed and were  otherwise negative with the exception of those mentioned in the HPI and as above.  Physical Exam: Filed Vitals:   07/03/11 0604  BP: 137/84  Pulse: 72  Temp: 98 F (36.7 C)  Resp: 18    General: Alert, no acute distress Cardiovascular: No pedal edema Respiratory: No cyanosis, no use of accessory musculature GI: No organomegaly, abdomen is soft and non-tender Skin: No lesions in the area of chief complaint Neurologic: Sensation intact distally Psychiatric: Patient is competent for consent with normal mood and affect Lymphatic: No axillary or cervical lymphadenopathy  MUSCULOSKELETAL: + spurling's on left  Assessment/Plan: Left arm pain Plan for Procedure(s): ANTERIOR CERVICAL DECOMPRESSION/DISCECTOMY FUSION 2 levels   Emilee Hero, MD 07/03/2011 6:27 AM

## 2011-07-03 NOTE — Transfer of Care (Signed)
Immediate Anesthesia Transfer of Care Note  Patient: Nicolas Allen  Procedure(s) Performed:  ANTERIOR CERVICAL DECOMPRESSION/DISCECTOMY FUSION 4 LEVELS - Anterior cervical decompression fusion, cervial 5-6, cervical 6-7 with instrumentation and allograft.  Patient Location: PACU  Anesthesia Type: General  Level of Consciousness:  awake, oriented and patient cooperative, MAE x4  Airway & Oxygen Therapy: Patient Spontanous Breathing and Patient connected to face mask oxygen  Post-op Assessment: Report given to PACU RN and Post -op Vital signs reviewed and stable  Post vital signs: Reviewed Filed Vitals:   07/03/11 0604  BP: 137/84  Pulse: 72  Temp: 36.7 C  Resp: 18    Complications: No apparent anesthesia complications

## 2011-07-03 NOTE — Preoperative (Signed)
Beta Blockers   Reason not to administer Beta Blockers:Not Applicable 

## 2011-07-04 NOTE — Op Note (Signed)
NAMERENARDO, CHEATUM NO.:  [id number]  MEDICAL RECORD NO.:  [id number]  LOCATION:  3526                         FACILITY:  [facility]  PHYSICIAN:  Estill Bamberg, MD      DATE OF BIRTH:  1956/05/28  DATE OF PROCEDURE:  07/03/2011 DATE OF DISCHARGE:                              OPERATIVE REPORT   PREOPERATIVE DIAGNOSES: 1. Left-sided cervical radiculopathy. 2. C5-6, C6-7 degenerative disk disease.  POSTOPERATIVE DIAGNOSES: 1. Left-sided cervical radiculopathy. 2. C5-6, C6-7 degenerative disk disease.  PROCEDURES: 1. Anterior cervical decompression and fusion, C5-6, C6-7. 2. Insertion of interbody device x2 (7 mm, lordotic, medium interbody     cage, Titan). 3. Placement of anterior instrumentation at C5, C6, C7. 4. Use of local autograft. 5. Use of morselized allograft (DBX mix). 6. Intraoperative use of fluoroscopy.  SURGEON:  Estill Bamberg, M.D.  ASSISTANT:  Janace Litten, OPA.  ANESTHESIA:  General endotracheal anesthesia.  COMPLICATIONS:  None.  DISPOSITION:  Stable.  ESTIMATED BLOOD LOSS:  Minimal.  INDICATIONS FOR PROCEDURE:  Briefly, Mr. Elison is a very pleasant 56- year-old male who presented to my office initially with severe and debilitating pain in the left arm.  He also had neck pain.  He did have an MRI obtained previously, which was notable for obvious compression of the left-sided exiting nerves at the C5-6 and C6-7 levels.  The patient also had significant weakness on the left side.  Given the patient's weakness and failure of conservative care, we did have a discussion regarding going forward with a 2-level ACDF as noted above.  The patient fully understood the risks and limitations of the procedure as outlined in my preoperative note.  OPERATIVE DETAILS:  On July 03, 2011, the patient was brought to surgery and general endotracheal anesthesia was administered.  The patient was placed supine on a hospital bed.  Antibiotics were  given. Neck was placed in a gentle degree of extension.  SCDs were placed and a time-out of procedure was performed.  I then brought in lateral fluoroscopy in order to help to optimize the location of the incision. I did Carnel Stegman out the approximate level of the C7 vertebral body.  The neck was then prepped and draped in the usual fashion.  A transverse incision was made from the midline to the medial border of the sternocleidomastoid muscle.  The plane was explored and the anterior cervical spine was readily identified.  I did use a lateral fluoroscopic view with a 20-gauge needle to confirm the appropriate operative level. I then subperiosteally exposed the vertebral bodies of C5, C6 and C7, and a self-retaining shadow line retractor was placed, centered over the C6-7 interspace.  I then used a #15 blade knife to perform an annulotomy anteriorly.  I then went forward with a thorough diskectomy to the posterior longitudinal ligament.  I then used a #4 straight curette to gain access to the posterior longitudinal ligament.  I then used a #1 and then a #2 Kerrison and pituitary to remove the osteophytes posteriorly and to remove the disk protrusions and osteophytes causing left-sided neural foraminal compression.  At the termination of this aspect of the procedure, I was easily able  to pass a nerve hook out the neural foramen on the left side.  I then gently prepared the endplates and placed a lordotic medium Titan interbody implant, filled with allograft and additional autograft retained for removing osteophytes anteriorly.  Again, the graft was packed as noted above and tamped into position in the usual fashion under distraction.  I was very happy with the final press fit.  I then turned my attention towards the C5-6 interspace.  A diskectomy was performed in the manner just as described. I again was able to easily pass a nerve hook out the left neural foramen and again, autograft of the  size previously mention was packed as previously mentioned and tamped into position in the usual fashion.  I was very happy with the final press fit.  I then chose an appropriately sized Synthes Vectra plate.  The plate was placed over the anterior cervical spine and a total of 6 self-tapping screws, 16 mm in length were placed.  I was very happy with the final construct.  At this point, the anterior cervical spine was explored and all bleeding vessels were coagulated.  The platysma was closed using 2-0 Vicryl and the skin was closed using 4-0 Monocryl.  Benzoin and a sterile dressing was applied. All instrument counts were correct at the termination of the procedure.  Of note, Janace Litten was my assistant throughout the procedure and aided in essential retraction and suctioning required throughout the surgery.  Also of note, I did liberally used AP and lateral fluoroscopy while placing the hardware to ensure optimal positioning of the implants.     Estill Bamberg, MD     MD/MEDQ  D:  07/03/2011  T:  07/04/2011  Job:  [id number]  cc:   Corwin Levins, MD

## 2011-07-04 NOTE — Progress Notes (Signed)
Pt POD #1 after C5-7 acdf, doing well.  Left arm pain resolved.  AVSS  Aspen collar appropriately applied  NVI   POD #1 after C5-7 acdf - continuie aspen collar - d/c home - f/u 2 weeks

## 2011-07-05 NOTE — Discharge Summary (Signed)
NAMEIDREES, QUAM NO.:  [id number]  MEDICAL RECORD NO.:  [id number]  LOCATION:  3526                         FACILITY:  [facility]  PHYSICIAN:  Estill Bamberg, MD      DATE OF BIRTH:  [birth year]  DATE OF ADMISSION:  07/03/2011 DATE OF DISCHARGE:  07/04/2011                              DISCHARGE SUMMARY   ADMISSION DIAGNOSES: 1. Left-sided cervical radiculopathy. 2. C5-6, C6-7 degenerative disk disease.  DISCHARGE DIAGNOSES: 1. Left-sided cervical radiculopathy. 2. C5-6, C6-7 degenerative disk disease.  ATTENDING PHYSICIAN:  Estill Bamberg, M.D.  ADMISSION HISTORY:  Briefly, Mr. Peretz is a very pleasant 56 year old male who presented to my office initially with severe debilitating pain in the left arm.  I did review an MRI, which was notable for a neural foraminal compression involving the left side at the C5-6 and C6-7 levels.  The patient did fail various forms of conservative care and the patient did elect to go forward with a 2-level anterior cervical decompression and fusion to address the neural foraminal stenosis at the C5-6 and C6-7 levels.  HOSPITAL COURSE:  The patient was brought to surgery on July 03, 2011, for the procedure noted above.  The patient tolerated the procedure well and was transferred to recovery in stable condition.  A hard Aspen cervical collar was placed postoperatively.  The patient was evaluated by me on the morning of postoperative day #1.  The patient's previous left-sided arm pain was entirely resolved on the morning of postop day #1.  His neck discomfort was minimal.  He was ambulating in the hallways without any difficulties.  The patient was uneventfully discharged home.  DISCHARGE INSTRUCTIONS:  The patient will take Percocet for pain and Valium for spasms.  He will avoid lifting over 10 pounds.  He was fitted with a bone stimulator.  He can use this 4 hours per day.  He will follow up in my office in approximately  2 weeks after his procedure.     Estill Bamberg, MD     MD/MEDQ  D:  07/04/2011  T:  07/05/2011  Job:  [id number]

## 2012-02-10 ENCOUNTER — Other Ambulatory Visit (INDEPENDENT_AMBULATORY_CARE_PROVIDER_SITE_OTHER): Payer: 59

## 2012-02-10 DIAGNOSIS — R7302 Impaired glucose tolerance (oral): Secondary | ICD-10-CM

## 2012-02-10 DIAGNOSIS — Z0000 Encounter for general adult medical examination without abnormal findings: Secondary | ICD-10-CM

## 2012-02-10 DIAGNOSIS — R7309 Other abnormal glucose: Secondary | ICD-10-CM

## 2012-02-10 LAB — HEPATIC FUNCTION PANEL
ALT: 11 U/L (ref 0–53)
AST: 15 U/L (ref 0–37)
Albumin: 3.5 g/dL (ref 3.5–5.2)
Alkaline Phosphatase: 48 U/L (ref 39–117)
Bilirubin, Direct: 0.1 mg/dL (ref 0.0–0.3)
Total Bilirubin: 0.7 mg/dL (ref 0.3–1.2)
Total Protein: 6.4 g/dL (ref 6.0–8.3)

## 2012-02-10 LAB — CBC WITH DIFFERENTIAL/PLATELET
Basophils Absolute: 0 10*3/uL (ref 0.0–0.1)
Basophils Relative: 0.5 % (ref 0.0–3.0)
Eosinophils Absolute: 0.3 10*3/uL (ref 0.0–0.7)
Eosinophils Relative: 4.3 % (ref 0.0–5.0)
HCT: 35.4 % — ABNORMAL LOW (ref 39.0–52.0)
Hemoglobin: 11.4 g/dL — ABNORMAL LOW (ref 13.0–17.0)
Lymphocytes Relative: 27.5 % (ref 12.0–46.0)
Lymphs Abs: 2.1 10*3/uL (ref 0.7–4.0)
MCHC: 32 g/dL (ref 30.0–36.0)
MCV: 85.6 fl (ref 78.0–100.0)
Monocytes Absolute: 0.6 10*3/uL (ref 0.1–1.0)
Monocytes Relative: 7.5 % (ref 3.0–12.0)
Neutro Abs: 4.6 10*3/uL (ref 1.4–7.7)
Neutrophils Relative %: 60.2 % (ref 43.0–77.0)
Platelets: 172 10*3/uL (ref 150.0–400.0)
RBC: 4.14 Mil/uL — ABNORMAL LOW (ref 4.22–5.81)
RDW: 15.1 % — ABNORMAL HIGH (ref 11.5–14.6)
WBC: 7.6 10*3/uL (ref 4.5–10.5)

## 2012-02-10 LAB — BASIC METABOLIC PANEL
BUN: 10 mg/dL (ref 6–23)
CO2: 27 mEq/L (ref 19–32)
Calcium: 8.7 mg/dL (ref 8.4–10.5)
Chloride: 105 mEq/L (ref 96–112)
Creatinine, Ser: 0.8 mg/dL (ref 0.4–1.5)
GFR: 127.25 mL/min (ref >60.00)
Glucose, Bld: 84 mg/dL (ref 70–99)
Potassium: 3.7 mEq/L (ref 3.5–5.1)
Sodium: 139 mEq/L (ref 135–145)

## 2012-02-10 LAB — URINALYSIS, ROUTINE W REFLEX MICROSCOPIC
Bilirubin Urine: NEGATIVE
Hgb urine dipstick: NEGATIVE
Ketones, ur: NEGATIVE
Leukocytes, UA: NEGATIVE
Nitrite: NEGATIVE
Specific Gravity, Urine: 1.02 (ref 1.000–1.030)
Total Protein, Urine: NEGATIVE
Urine Glucose: NEGATIVE
Urobilinogen, UA: 1 (ref 0.0–1.0)
pH: 6 (ref 5.0–8.0)

## 2012-02-10 LAB — PSA: PSA: 0.29 ng/mL (ref 0.10–4.00)

## 2012-02-10 LAB — LIPID PANEL
Cholesterol: 147 mg/dL (ref 0–200)
HDL: 53.8 mg/dL (ref >39.00)
LDL Cholesterol: 80 mg/dL (ref 0–99)
Total CHOL/HDL Ratio: 3
Triglycerides: 64 mg/dL (ref 0.0–149.0)
VLDL: 12.8 mg/dL (ref 0.0–40.0)

## 2012-02-10 LAB — TSH: TSH: 0.76 u[IU]/mL (ref 0.35–5.50)

## 2012-02-10 LAB — HEMOGLOBIN A1C: Hgb A1c MFr Bld: 5.6 % (ref 4.6–6.5)

## 2012-02-11 ENCOUNTER — Encounter: Payer: Self-pay | Admitting: Internal Medicine

## 2012-02-11 DIAGNOSIS — E538 Deficiency of other specified B group vitamins: Secondary | ICD-10-CM | POA: Insufficient documentation

## 2012-02-11 LAB — IBC PANEL
Iron: 44 ug/dL (ref 42–165)
Saturation Ratios: 16.8 % — ABNORMAL LOW (ref 20.0–50.0)
Transferrin: 186.8 mg/dL — ABNORMAL LOW (ref 212.0–360.0)

## 2012-02-11 LAB — VITAMIN B12: Vitamin B-12: 174 pg/mL — ABNORMAL LOW (ref 211–911)

## 2012-02-12 ENCOUNTER — Ambulatory Visit (INDEPENDENT_AMBULATORY_CARE_PROVIDER_SITE_OTHER): Payer: 59 | Admitting: Internal Medicine

## 2012-02-12 ENCOUNTER — Telehealth: Payer: Self-pay

## 2012-02-12 ENCOUNTER — Encounter: Payer: Self-pay | Admitting: Internal Medicine

## 2012-02-12 VITALS — BP 112/62 | HR 99 | Temp 97.9°F | Ht 72.0 in | Wt 140.2 lb

## 2012-02-12 DIAGNOSIS — Z0000 Encounter for general adult medical examination without abnormal findings: Secondary | ICD-10-CM

## 2012-02-12 DIAGNOSIS — E538 Deficiency of other specified B group vitamins: Secondary | ICD-10-CM

## 2012-02-12 DIAGNOSIS — L989 Disorder of the skin and subcutaneous tissue, unspecified: Secondary | ICD-10-CM

## 2012-02-12 DIAGNOSIS — M5090 Cervical disc disorder, unspecified, unspecified cervical region: Secondary | ICD-10-CM

## 2012-02-12 DIAGNOSIS — I10 Essential (primary) hypertension: Secondary | ICD-10-CM

## 2012-02-12 HISTORY — DX: Cervical disc disorder, unspecified, unspecified cervical region: M50.90

## 2012-02-12 MED ORDER — PRAVASTATIN SODIUM 40 MG PO TABS: 40.0000 mg | ORAL_TABLET | Freq: Every day | ORAL | Status: DC

## 2012-02-12 MED ORDER — AMLODIPINE BESY-BENAZEPRIL HCL 10-20 MG PO CAPS: 1.0000 | ORAL_CAPSULE | Freq: Every day | ORAL | Status: DC

## 2012-02-12 MED ORDER — VARDENAFIL HCL 20 MG PO TABS: 20.0000 mg | ORAL_TABLET | ORAL | Status: DC | PRN

## 2012-02-12 NOTE — Telephone Encounter (Signed)
Put order in for CBC/b 12.  Expected the end of November.

## 2012-02-12 NOTE — Patient Instructions (Addendum)
Continue all other medications as before Your refills were all done today, including the levitra Please keep your appointments with your specialists as you have planned  - the upper dentures Please call if you want the referral to ENT for the possible "lipoma" to face (this is benign, but can enlarge) Please continue your efforts at being more active, low cholesterol diet, and weight control. You are otherwise up to date with prevention Please go to LAB in the Basement for the blood tests to be done in 3 month - the B12 level If the B12 is still low, you may need the b12 shots Please return in 1 year for your yearly visit, or sooner if needed, with Lab testing done 3-5 days before

## 2012-02-12 NOTE — Assessment & Plan Note (Signed)
For b12 otc for now, f/u in 3 mo, consider IM if not improved

## 2012-02-14 DIAGNOSIS — L989 Disorder of the skin and subcutaneous tissue, unspecified: Secondary | ICD-10-CM | POA: Insufficient documentation

## 2012-02-14 NOTE — Assessment & Plan Note (Signed)

## 2012-02-14 NOTE — Assessment & Plan Note (Signed)
stable overall by hx and exam, most recent data reviewed with pt, and pt to continue medical treatment as before BP Readings from Last 3 Encounters:  02/12/12 112/62  07/04/11 150/81  07/04/11 150/81

## 2012-02-14 NOTE — Assessment & Plan Note (Signed)
Right facies, prob small lipoma, declines referral at this time

## 2012-02-14 NOTE — Progress Notes (Signed)
Subjective:    Patient ID: Nicolas Allen, male    DOB: 05-09-1956, 56 y.o.   MRN: [id number]  HPI  Here for wellness and f/u;  Overall doing ok;  Pt denies CP, worsening SOB, DOE, wheezing, orthopnea, PND, worsening LE edema, palpitations, dizziness or syncope.  Pt denies neurological change such as new Headache, facial or extremity weakness.  Pt denies polydipsia, polyuria, or low sugar symptoms. Pt states overall good compliance with treatment and medications, good tolerability, and trying to follow lower cholesterol diet.  Pt denies worsening depressive symptoms, suicidal ideation or panic. No fever, wt loss, night sweats, loss of appetite, or other constitutional symptoms.  Pt states good ability with ADL's, low fall risk, home safety reviewed and adequate, no significant changes in hearing or vision, and occasionally active with exercise.  No acute complaints.  Was out of work approx 6 mo earlier this yr after cervical disc surgury, but now doing well, no pain.  Had upper teeth pulled recently, for dentures soon.  Has a slightly increased size of fatty mass to right lateral face in the past yr. Past Medical History  Diagnosis Date  . ANXIETY 02/03/2007  . GLUCOSE INTOLERANCE 01/04/2008  . HYPERLIPIDEMIA 02/03/2007  . HYPERTENSION 02/03/2007  . Impaired glucose tolerance 02/11/2011  . Erectile dysfunction 02/11/2011  . GERD (gastroesophageal reflux disease)   . IBS (irritable bowel syndrome)   . Cervical disc disease 02/12/2012    S/p surgury jan 2013   Past Surgical History  Procedure Date  . No past surgeries     reports that he has been smoking Cigarettes.  He has a 45 pack-year smoking history. He does not have any smokeless tobacco history on file. He reports that he does not drink alcohol or use illicit drugs. family history includes Cancer in his cousin and mother; Hypertension in his other; and Stroke in his other. No Known Allergies Current Outpatient Prescriptions on File Prior to  Visit  Medication Sig Dispense Refill  . amLODipine-benazepril (LOTREL) 10-20 MG per capsule Take 1 capsule by mouth daily.  90 capsule  3  . pravastatin (PRAVACHOL) 40 MG tablet Take 1 tablet (40 mg total) by mouth daily.  90 tablet  3  . vardenafil (LEVITRA) 20 MG tablet Take 1 tablet (20 mg total) by mouth as needed for erectile dysfunction.  10 tablet  11   Review of Systems Review of Systems  Constitutional: Negative for diaphoresis, activity change, appetite change and unexpected weight change.  HENT: Negative for hearing loss, ear pain, facial swelling, mouth sores and neck stiffness.   Eyes: Negative for pain, redness and visual disturbance.  Respiratory: Negative for shortness of breath and wheezing.   Cardiovascular: Negative for chest pain and palpitations.  Gastrointestinal: Negative for diarrhea, blood in stool, abdominal distention and rectal pain.  Genitourinary: Negative for hematuria, flank pain and decreased urine volume.  Musculoskeletal: Negative for myalgias and joint swelling.  Skin: Negative for color change and wound.  Neurological: Negative for syncope and numbness.  Hematological: Negative for adenopathy.  Psychiatric/Behavioral: Negative for hallucinations, self-injury, decreased concentration and agitation.      Objective:   Physical Exam BP 112/62  Pulse 99  Temp 97.9 F (36.6 C) (Oral)  Ht 6' (1.829 m)  Wt 140 lb 4 oz (63.617 kg)  BMI 19.02 kg/m2  SpO2 99% Physical Exam  VS noted Constitutional: Pt is oriented to person, place, and time. Appears well-developed and well-nourished.  HENT:  Head: Normocephalic and atraumatic.  Right Ear: External ear normal.  Left Ear: External ear normal.  Nose: Nose normal.  Mouth/Throat: Oropharynx is clear and moist.  Edentulous upper, has several lower front teeth remaining in good repair  Eyes: Conjunctivae and EOM are normal. Pupils are equal, round, and reactive to light.  Neck: Normal range of motion. Neck  supple. No JVD present. No tracheal deviation present.  Cardiovascular: Normal rate, regular rhythm, normal heart sounds and intact distal pulses.   Pulmonary/Chest: Effort normal and breath sounds normal.  Abdominal: Soft. Bowel sounds are normal. There is no tenderness.  Musculoskeletal: Normal range of motion. Exhibits no edema.  Lymphadenopathy:  Has no cervical adenopathy.  Neurological: Pt is alert and oriented to person, place, and time. Pt has normal reflexes. No cranial nerve deficit.  Skin: Skin is warm and dry. No rash noted. Right face approx 1 cm lateral to eye with approx 1 cm fatty subq mass, nontender Psychiatric:  Has  normal mood and affect. Behavior is normal.     Assessment & Plan:

## 2012-06-24 ENCOUNTER — Encounter: Payer: Self-pay | Admitting: Internal Medicine

## 2012-06-24 ENCOUNTER — Ambulatory Visit (INDEPENDENT_AMBULATORY_CARE_PROVIDER_SITE_OTHER): Payer: 59 | Admitting: Internal Medicine

## 2012-06-24 ENCOUNTER — Other Ambulatory Visit (INDEPENDENT_AMBULATORY_CARE_PROVIDER_SITE_OTHER): Payer: 59

## 2012-06-24 VITALS — BP 122/70 | HR 94 | Temp 97.2°F | Ht 72.0 in | Wt 148.5 lb

## 2012-06-24 DIAGNOSIS — D649 Anemia, unspecified: Secondary | ICD-10-CM

## 2012-06-24 DIAGNOSIS — I10 Essential (primary) hypertension: Secondary | ICD-10-CM

## 2012-06-24 DIAGNOSIS — E538 Deficiency of other specified B group vitamins: Secondary | ICD-10-CM

## 2012-06-24 DIAGNOSIS — R21 Rash and other nonspecific skin eruption: Secondary | ICD-10-CM | POA: Insufficient documentation

## 2012-06-24 HISTORY — DX: Anemia, unspecified: D64.9

## 2012-06-24 LAB — CBC WITH DIFFERENTIAL/PLATELET
Basophils Absolute: 0.1 10*3/uL (ref 0.0–0.1)
Basophils Relative: 0.9 % (ref 0.0–3.0)
Eosinophils Absolute: 0.5 10*3/uL (ref 0.0–0.7)
Eosinophils Relative: 8.5 % — ABNORMAL HIGH (ref 0.0–5.0)
HCT: 40.3 % (ref 39.0–52.0)
Hemoglobin: 13.1 g/dL (ref 13.0–17.0)
Lymphocytes Relative: 38.3 % (ref 12.0–46.0)
Lymphs Abs: 2.3 10*3/uL (ref 0.7–4.0)
MCHC: 32.6 g/dL (ref 30.0–36.0)
MCV: 84.9 fl (ref 78.0–100.0)
Monocytes Absolute: 0.6 10*3/uL (ref 0.1–1.0)
Monocytes Relative: 9.8 % (ref 3.0–12.0)
Neutro Abs: 2.5 10*3/uL (ref 1.4–7.7)
Neutrophils Relative %: 42.5 % — ABNORMAL LOW (ref 43.0–77.0)
Platelets: 144 10*3/uL — ABNORMAL LOW (ref 150.0–400.0)
RBC: 4.75 Mil/uL (ref 4.22–5.81)
RDW: 14.5 % (ref 11.5–14.6)
WBC: 5.9 10*3/uL (ref 4.5–10.5)

## 2012-06-24 LAB — VITAMIN B12: Vitamin B-12: 245 pg/mL (ref 211–911)

## 2012-06-24 MED ORDER — METHYLPREDNISOLONE ACETATE 80 MG/ML IJ SUSP
120.0000 mg | Freq: Once | INTRAMUSCULAR | Status: AC
  Administered 2012-06-24: 120 mg via INTRAMUSCULAR

## 2012-06-24 MED ORDER — FLUOCINONIDE-E 0.05 % EX CREA: TOPICAL_CREAM | Freq: Two times a day (BID) | CUTANEOUS | Status: DC

## 2012-06-24 MED ORDER — PREDNISONE 10 MG PO TABS: ORAL_TABLET | ORAL | Status: DC

## 2012-06-24 NOTE — Progress Notes (Signed)
Subjective:    Patient ID: Nicolas Allen, male    DOB: [birth year], 57 y.o.   MRN: [id number]  HPI  Here to f/u recent low b12 and mild anemia, has been taking his B12 complex daily, Overall good compliance with treatment, and good medicine tolerability.  No overt bleeding or bruising.  Pt denies chest pain, increased sob or doe, wheezing, orthopnea, PND, increased LE swelling, palpitations, dizziness or syncope.   Pt denies polydipsia, polyuria. Pt denies new neurological symptoms such as new headache, or facial or extremity weakness or numbness  Does have very extensive pruritic slight raised but nontender rash to bilat lower chest, lateral abdominal side areas and lateral legs from mid thigh to mid calves.  No fever, ongoing for over a month Past Medical History  Diagnosis Date  . ANXIETY 02/03/2007  . GLUCOSE INTOLERANCE 01/04/2008  . HYPERLIPIDEMIA 02/03/2007  . HYPERTENSION 02/03/2007  . Impaired glucose tolerance 02/11/2011  . Erectile dysfunction 02/11/2011  . GERD (gastroesophageal reflux disease)   . IBS (irritable bowel syndrome)   . Cervical disc disease 02/12/2012    S/p surgury jan 2013  . Anemia 06/24/2012   Past Surgical History  Procedure Date  . No past surgeries     reports that he has been smoking Cigarettes.  He has a 45 pack-year smoking history. He does not have any smokeless tobacco history on file. He reports that he does not drink alcohol or use illicit drugs. family history includes Cancer in his cousin and mother; Hypertension in his other; and Stroke in his other. No Known Allergies Current Outpatient Prescriptions on File Prior to Visit  Medication Sig Dispense Refill  . amLODipine-benazepril (LOTREL) 10-20 MG per capsule Take 1 capsule by mouth daily.  90 capsule  3  . aspirin 81 MG tablet Take 81 mg by mouth daily.      . pravastatin (PRAVACHOL) 40 MG tablet Take 1 tablet (40 mg total) by mouth daily.  90 tablet  3  . vardenafil (LEVITRA) 20 MG tablet Take 1  tablet (20 mg total) by mouth as needed for erectile dysfunction.  10 tablet  11   Review of Systems  Constitutional: Negative for diaphoresis and unexpected weight change.  HENT: Negative for tinnitus.   Eyes: Negative for photophobia and visual disturbance.  Respiratory: Negative for choking and stridor.   Gastrointestinal: Negative for vomiting and blood in stool.  Genitourinary: Negative for hematuria and decreased urine volume.  Musculoskeletal: Negative for gait problem.  Skin: Negative for color change and wound.  Neurological: Negative for tremors and numbness.  Psychiatric/Behavioral: Negative for decreased concentration. The patient is not hyperactive.       Objective:   Physical Exam BP 122/70  Pulse 94  Temp 97.2 F (36.2 C) (Oral)  Ht 6' (1.829 m)  Wt 148 lb 8 oz (67.359 kg)  BMI 20.14 kg/m2  SpO2 99% Physical Exam  VS noted Constitutional: Pt appears well-developed and well-nourished.  HENT: Head: Normocephalic.  Right Ear: External ear normal.  Left Ear: External ear normal.  Eyes: Conjunctivae and EOM are normal. Pupils are equal, round, and reactive to light.  Neck: Normal range of motion. Neck supple.  Cardiovascular: Normal rate and regular rhythm.   Pulmonary/Chest: Effort normal and breath sounds normal.  Abd:  Soft, NT, non-distended, + BS Neurological: Pt is alert. Not confused  Skin: with very extensive areas where he can scratch to sides of torso and legs c/w eczema like rash  Psychiatric: Pt behavior  is normal. Thought content normal.     Assessment & Plan:

## 2012-06-24 NOTE — Assessment & Plan Note (Signed)
With low b12 last visit, on oral since then, for f/u lab

## 2012-06-24 NOTE — Assessment & Plan Note (Signed)
stable overall by hx and exam, most recent data reviewed with pt, and pt to continue medical treatment as before BP Readings from Last 3 Encounters:  06/24/12 122/70  02/12/12 112/62  07/04/11 150/81

## 2012-06-24 NOTE — Assessment & Plan Note (Signed)
Likely b12 related, for f/u cbc today after b12 oral use since aug 2013

## 2012-06-24 NOTE — Patient Instructions (Addendum)
You had the steroid shot today Take all new medications as prescribed  - the prednisone pills, and generic Lidex cream (steroid cream) for the rash Please call if not improved in the next 3-5 days for dermatology referral Please go to LAB in the Basement for the blood and/or urine tests to be done today You will be contacted by phone if any changes need to be made immediately.  Otherwise, you will receive a letter about your results with an explanation Please remember to sign up for My Chart at your earliest convenience, as this will be important to you in the future with finding out test results.

## 2012-06-24 NOTE — Assessment & Plan Note (Signed)
C/w eczema like rash - for depomedrol IM, predpack, and steroid cr asd needed,  to f/u any worsening symptoms or concerns

## 2012-09-16 ENCOUNTER — Ambulatory Visit (INDEPENDENT_AMBULATORY_CARE_PROVIDER_SITE_OTHER): Payer: 59 | Admitting: Internal Medicine

## 2012-09-16 ENCOUNTER — Encounter: Payer: Self-pay | Admitting: Internal Medicine

## 2012-09-16 VITALS — BP 110/72 | HR 77 | Temp 98.3°F | Ht 72.0 in | Wt 148.0 lb

## 2012-09-16 DIAGNOSIS — W57XXXA Bitten or stung by nonvenomous insect and other nonvenomous arthropods, initial encounter: Secondary | ICD-10-CM

## 2012-09-16 DIAGNOSIS — R7309 Other abnormal glucose: Secondary | ICD-10-CM

## 2012-09-16 DIAGNOSIS — R7302 Impaired glucose tolerance (oral): Secondary | ICD-10-CM

## 2012-09-16 DIAGNOSIS — I10 Essential (primary) hypertension: Secondary | ICD-10-CM

## 2012-09-16 DIAGNOSIS — IMO0001 Reserved for inherently not codable concepts without codable children: Secondary | ICD-10-CM

## 2012-09-16 MED ORDER — DOXYCYCLINE HYCLATE 100 MG PO TABS: 100.0000 mg | ORAL_TABLET | Freq: Two times a day (BID) | ORAL | Status: DC

## 2012-09-16 NOTE — Progress Notes (Signed)
Subjective:    Patient ID: Nicolas Allen, male    DOB: 1955-08-03, 57 y.o.   MRN: [id number]  HPI  Here after finding of tick to left prox leg yesterday and removed himself, the next day after several hours at the horseshoe tournament, winning first prize.  Today here with feeling fatigue and malaise, with red/tender/swelling at the site, without red streaks or fever, chills.  No rash.  Pt denies chest pain, increased sob or doe, wheezing, orthopnea, PND, increased LE swelling, palpitations, dizziness or syncope.   Pt denies polydipsia, polyuria.   Past Medical History  Diagnosis Date  . ANXIETY 02/03/2007  . GLUCOSE INTOLERANCE 01/04/2008  . HYPERLIPIDEMIA 02/03/2007  . HYPERTENSION 02/03/2007  . Impaired glucose tolerance 02/11/2011  . Erectile dysfunction 02/11/2011  . GERD (gastroesophageal reflux disease)   . IBS (irritable bowel syndrome)   . Cervical disc disease 02/12/2012    S/p surgury jan 2013  . Anemia 06/24/2012   Past Surgical History  Procedure Laterality Date  . No past surgeries      reports that he has been smoking Cigarettes.  He has a 45 pack-year smoking history. He does not have any smokeless tobacco history on file. He reports that he does not drink alcohol or use illicit drugs. family history includes Cancer in his cousin and mother; Hypertension in his other; and Stroke in his other. No Known Allergies Current Outpatient Prescriptions on File Prior to Visit  Medication Sig Dispense Refill  . amLODipine-benazepril (LOTREL) 10-20 MG per capsule Take 1 capsule by mouth daily.  90 capsule  3  . aspirin 81 MG tablet Take 81 mg by mouth daily.      . fluocinonide-emollient (LIDEX-E) 0.05 % cream Apply topically 2 (two) times daily.  30 g  1  . pravastatin (PRAVACHOL) 40 MG tablet Take 1 tablet (40 mg total) by mouth daily.  90 tablet  3  . vardenafil (LEVITRA) 20 MG tablet Take 1 tablet (20 mg total) by mouth as needed for erectile dysfunction.  10 tablet  11   No current  facility-administered medications on file prior to visit.   Review of Systems,  Constitutional: Negative for unexpected weight change, or unusual diaphoresis  HENT: Negative for tinnitus.   Eyes: Negative for photophobia and visual disturbance.  Respiratory: Negative for choking and stridor.   Gastrointestinal: Negative for vomiting and blood in stool.  Genitourinary: Negative for hematuria and decreased urine volume.  Musculoskeletal: Negative for acute joint swelling Skin: Negative for color change and wound.  Neurological: Negative for tremors and numbness other than noted  Psychiatric/Behavioral: Negative for decreased concentration or  hyperactivity.       Objective:   Physical Exam BP 110/72  Pulse 77  Temp(Src) 98.3 F (36.8 C) (Oral)  Ht 6' (1.829 m)  Wt 148 lb (67.132 kg)  BMI 20.07 kg/m2  SpO2 97% VS noted,  Constitutional: Pt appears well-developed and well-nourished.  HENT: Head: NCAT.  Right Ear: External ear normal.  Left Ear: External ear normal.  Eyes: Conjunctivae and EOM are normal. Pupils are equal, round, and reactive to light.  Neck: Normal range of motion. Neck supple.  Cardiovascular: Normal rate and regular rhythm.   Pulmonary/Chest: Effort normal and breath sounds normal.  Neurological: Pt is alert. Not confused  Skin: with 2 cm area red/tender/swelling nonfluctuant at the prox leg near the mid inguinal ligament, no red streaks, no groin tender LA Psychiatric: Pt behavior is normal. Thought content normal.  Assessment & Plan:

## 2012-09-16 NOTE — Assessment & Plan Note (Signed)
stable overall by history and exam, recent data reviewed with pt, and pt to continue medical treatment as before,  to f/u any worsening symptoms or concerns Lab Results  Component Value Date   HGBA1C 5.6 02/10/2012

## 2012-09-16 NOTE — Assessment & Plan Note (Signed)
stable overall by history and exam, recent data reviewed with pt, and pt to continue medical treatment as before,  to f/u any worsening symptoms or concerns BP Readings from Last 3 Encounters:  09/16/12 110/72  06/24/12 122/70  02/12/12 112/62

## 2012-09-16 NOTE — Assessment & Plan Note (Signed)
Mild to mod, for antibx course,  to f/u any worsening symptoms or concerns 

## 2012-09-16 NOTE — Patient Instructions (Signed)
Please take all new medication as prescribed Please continue all other medications as before, and refills have been done if requested.  

## 2013-02-07 ENCOUNTER — Other Ambulatory Visit (INDEPENDENT_AMBULATORY_CARE_PROVIDER_SITE_OTHER): Payer: 59

## 2013-02-07 DIAGNOSIS — E538 Deficiency of other specified B group vitamins: Secondary | ICD-10-CM

## 2013-02-07 LAB — CBC WITH DIFFERENTIAL/PLATELET
Basophils Absolute: 0 10*3/uL (ref 0.0–0.1)
Basophils Relative: 0.5 % (ref 0.0–3.0)
Eosinophils Absolute: 0.4 10*3/uL (ref 0.0–0.7)
Eosinophils Relative: 5.5 % — ABNORMAL HIGH (ref 0.0–5.0)
HCT: 37.4 % — ABNORMAL LOW (ref 39.0–52.0)
Hemoglobin: 12.1 g/dL — ABNORMAL LOW (ref 13.0–17.0)
Lymphocytes Relative: 39.1 % (ref 12.0–46.0)
Lymphs Abs: 3.1 10*3/uL (ref 0.7–4.0)
MCHC: 32.5 g/dL (ref 30.0–36.0)
MCV: 85.2 fl (ref 78.0–100.0)
Monocytes Absolute: 0.5 10*3/uL (ref 0.1–1.0)
Monocytes Relative: 6.9 % (ref 3.0–12.0)
Neutro Abs: 3.8 10*3/uL (ref 1.4–7.7)
Neutrophils Relative %: 48 % (ref 43.0–77.0)
Platelets: 134 10*3/uL — ABNORMAL LOW (ref 150.0–400.0)
RBC: 4.39 Mil/uL (ref 4.22–5.81)
RDW: 14.8 % — ABNORMAL HIGH (ref 11.5–14.6)
WBC: 8 10*3/uL (ref 4.5–10.5)

## 2013-02-07 LAB — VITAMIN B12: Vitamin B-12: 353 pg/mL (ref 211–911)

## 2013-02-11 ENCOUNTER — Other Ambulatory Visit (INDEPENDENT_AMBULATORY_CARE_PROVIDER_SITE_OTHER): Payer: 59

## 2013-02-11 ENCOUNTER — Encounter: Payer: Self-pay | Admitting: Internal Medicine

## 2013-02-11 ENCOUNTER — Ambulatory Visit (INDEPENDENT_AMBULATORY_CARE_PROVIDER_SITE_OTHER): Payer: 59 | Admitting: Internal Medicine

## 2013-02-11 VITALS — BP 112/62 | HR 89 | Temp 98.4°F | Ht 72.0 in | Wt 147.1 lb

## 2013-02-11 DIAGNOSIS — L989 Disorder of the skin and subcutaneous tissue, unspecified: Secondary | ICD-10-CM

## 2013-02-11 DIAGNOSIS — R7302 Impaired glucose tolerance (oral): Secondary | ICD-10-CM

## 2013-02-11 DIAGNOSIS — M79675 Pain in left toe(s): Secondary | ICD-10-CM

## 2013-02-11 DIAGNOSIS — M79609 Pain in unspecified limb: Secondary | ICD-10-CM

## 2013-02-11 DIAGNOSIS — Z0000 Encounter for general adult medical examination without abnormal findings: Secondary | ICD-10-CM

## 2013-02-11 DIAGNOSIS — N529 Male erectile dysfunction, unspecified: Secondary | ICD-10-CM

## 2013-02-11 DIAGNOSIS — R7309 Other abnormal glucose: Secondary | ICD-10-CM

## 2013-02-11 DIAGNOSIS — R21 Rash and other nonspecific skin eruption: Secondary | ICD-10-CM

## 2013-02-11 LAB — URINALYSIS, ROUTINE W REFLEX MICROSCOPIC
Bilirubin Urine: NEGATIVE
Hgb urine dipstick: NEGATIVE
Ketones, ur: NEGATIVE
Leukocytes, UA: NEGATIVE
Nitrite: NEGATIVE
Specific Gravity, Urine: 1.02 (ref 1.000–1.030)
Total Protein, Urine: NEGATIVE
Urine Glucose: NEGATIVE
Urobilinogen, UA: 1 (ref 0.0–1.0)
WBC, UA: NONE SEEN (ref 0–?)
pH: 7 (ref 5.0–8.0)

## 2013-02-11 LAB — HEPATIC FUNCTION PANEL
ALT: 17 U/L (ref 0–53)
AST: 26 U/L (ref 0–37)
Albumin: 3.9 g/dL (ref 3.5–5.2)
Alkaline Phosphatase: 46 U/L (ref 39–117)
Bilirubin, Direct: 0.1 mg/dL (ref 0.0–0.3)
Total Bilirubin: 0.5 mg/dL (ref 0.3–1.2)
Total Protein: 6.7 g/dL (ref 6.0–8.3)

## 2013-02-11 LAB — TSH: TSH: 0.91 u[IU]/mL (ref 0.35–5.50)

## 2013-02-11 LAB — BASIC METABOLIC PANEL
BUN: 10 mg/dL (ref 6–23)
CO2: 30 mEq/L (ref 19–32)
Calcium: 9.3 mg/dL (ref 8.4–10.5)
Chloride: 102 mEq/L (ref 96–112)
Creatinine, Ser: 0.8 mg/dL (ref 0.4–1.5)
GFR: 134.42 mL/min (ref >60.00)
Glucose, Bld: 86 mg/dL (ref 70–99)
Potassium: 4 mEq/L (ref 3.5–5.1)
Sodium: 137 mEq/L (ref 135–145)

## 2013-02-11 LAB — LIPID PANEL
Cholesterol: 154 mg/dL (ref 0–200)
HDL: 62.4 mg/dL (ref >39.00)
LDL Cholesterol: 81 mg/dL (ref 0–99)
Total CHOL/HDL Ratio: 2
Triglycerides: 55 mg/dL (ref 0.0–149.0)
VLDL: 11 mg/dL (ref 0.0–40.0)

## 2013-02-11 LAB — PSA: PSA: 0.25 ng/mL (ref 0.10–4.00)

## 2013-02-11 LAB — HEMOGLOBIN A1C: Hgb A1c MFr Bld: 5.5 % (ref 4.6–6.5)

## 2013-02-11 MED ORDER — KETOCONAZOLE 2 % EX CREA: TOPICAL_CREAM | Freq: Every day | CUTANEOUS | Status: DC

## 2013-02-11 NOTE — Assessment & Plan Note (Signed)
Pt requests referal derm

## 2013-02-11 NOTE — Assessment & Plan Note (Signed)
?   Mild ingrown nail - for podiatry referral

## 2013-02-11 NOTE — Assessment & Plan Note (Signed)
Gave sample stendra 100 mg for prn use, pt to call for rx if wants further

## 2013-02-11 NOTE — Progress Notes (Signed)
Subjective:    Patient ID: Nicolas Allen, male    DOB: 1956/05/04, 57 y.o.   MRN: [id number]  HPI Here for wellness and f/u;  Overall doing ok;  Pt denies CP, worsening SOB, DOE, wheezing, orthopnea, PND, worsening LE edema, palpitations, dizziness or syncope.  Pt denies neurological change such as new headache, facial or extremity weakness.  Pt denies polydipsia, polyuria, or low sugar symptoms. Pt states overall good compliance with treatment and medications, good tolerability, and has been trying to follow lower cholesterol diet.  Pt denies worsening depressive symptoms, suicidal ideation or panic. No fever, night sweats, wt loss, loss of appetite, or other constitutional symptoms.  Pt states good ability with ADL's, has low fall risk, home safety reviewed and adequate, no other significant changes in hearing or vision, and only occasionally active with exercise. Declines flu shot.  Asks for sample of alternative to levitra as has not been working as well recently for him.  Still smoking, though has no ETOH Koren Shiver x 14 yrs Past Medical History  Diagnosis Date  . ANXIETY 02/03/2007  . GLUCOSE INTOLERANCE 01/04/2008  . HYPERLIPIDEMIA 02/03/2007  . HYPERTENSION 02/03/2007  . Impaired glucose tolerance 02/11/2011  . Erectile dysfunction 02/11/2011  . GERD (gastroesophageal reflux disease)   . IBS (irritable bowel syndrome)   . Cervical disc disease 02/12/2012    S/p surgury jan 2013  . Anemia 06/24/2012   Past Surgical History  Procedure Laterality Date  . No past surgeries      reports that he has been smoking Cigarettes.  He has a 45 pack-year smoking history. He does not have any smokeless tobacco history on file. He reports that he does not drink alcohol or use illicit drugs. family history includes Cancer in his cousin and mother; Hypertension in his other; Stroke in his other. No Known Allergies Current Outpatient Prescriptions on File Prior to Visit  Medication Sig Dispense Refill  .  amLODipine-benazepril (LOTREL) 10-20 MG per capsule Take 1 capsule by mouth daily.  90 capsule  3  . aspirin 81 MG tablet Take 81 mg by mouth daily.      . fluocinonide-emollient (LIDEX-E) 0.05 % cream Apply topically 2 (two) times daily.  30 g  1  . pravastatin (PRAVACHOL) 40 MG tablet Take 1 tablet (40 mg total) by mouth daily.  90 tablet  3  . vardenafil (LEVITRA) 20 MG tablet Take 1 tablet (20 mg total) by mouth as needed for erectile dysfunction.  10 tablet  11   No current facility-administered medications on file prior to visit.   Review of Systems Constitutional: Negative for diaphoresis, activity change, appetite change or unexpected weight change.  HENT: Negative for hearing loss, ear pain, facial swelling, mouth sores and neck stiffness.   Eyes: Negative for pain, redness and visual disturbance.  Respiratory: Negative for shortness of breath and wheezing.   Cardiovascular: Negative for chest pain and palpitations.  Gastrointestinal: Negative for diarrhea, blood in stool, abdominal distention or other pain Genitourinary: Negative for hematuria, flank pain or change in urine volume.  Musculoskeletal: Negative for myalgias and joint swelling.  Skin: Negative for color change and wound.  Neurological: Negative for syncope and numbness. other than noted Hematological: Negative for adenopathy.  Psychiatric/Behavioral: Negative for hallucinations, self-injury, decreased concentration and agitation.      Objective:   Physical Exam BP 112/62  Pulse 89  Temp(Src) 98.4 F (36.9 C) (Oral)  Ht 6' (1.829 m)  Wt 147 lb 2 oz (66.735 kg)  BMI 19.95 kg/m2  SpO2 97% VS noted,  Constitutional: Pt is oriented to person, place, and time. Appears well-developed and well-nourished.  Head: Normocephalic and atraumatic.  Right Ear: External ear normal.  Left Ear: External ear normal.  Nose: Nose normal.  Mouth/Throat: Oropharynx is clear and moist.  Eyes: Conjunctivae and EOM are normal.  Pupils are equal, round, and reactive to light.  Neck: Normal range of motion. Neck supple. No JVD present. No tracheal deviation present.  Cardiovascular: Normal rate, regular rhythm, normal heart sounds and intact distal pulses.   Pulmonary/Chest: Effort normal and breath sounds normal.  Abdominal: Soft. Bowel sounds are normal. There is no tenderness. No HSM  Musculoskeletal: Normal range of motion. Exhibits no edema.  Lymphadenopathy:  Has no cervical adenopathy.  Neurological: Pt is alert and oriented to person, place, and time. Pt has normal reflexes. No cranial nerve deficit.  Skin: Skin is warm and dry. No rash noted. except for scaly rash to lateral feet, nontender, also > 1 cm cystic type rash to right face just caudal to right eye canthus Psychiatric:  Has  normal mood and affect. Behavior is normal.     Assessment & Plan:

## 2013-02-11 NOTE — Assessment & Plan Note (Signed)

## 2013-02-11 NOTE — Patient Instructions (Signed)
Please take all new medication as prescribed - the cream for the feet OK to try the stendra 100 mg as needed, and then call for prescription if this helps better for you than levitra Please continue all other medications as before You will be contacted regarding the referral for: podiatry (for the toe), and dermatology (for the facial cyst and feet rash) Please go to the LAB in the Basement (turn left off the elevator) for the tests to be done today You will be contacted by phone if any changes need to be made immediately.  Otherwise, you will receive a letter about your results with an explanation, but please check with MyChart first.  Please remember to sign up for My Chart if you have not done so, as this will be important to you in the future with finding out test results, communicating by private email, and scheduling acute appointments online when needed.  Please return in 6 months, or sooner if needed

## 2013-02-11 NOTE — Assessment & Plan Note (Signed)
To feet, c/w fungal most liekly , for trial ketocon cr asd,  to f/u any worsening symptoms or concerns

## 2013-02-15 ENCOUNTER — Encounter: Payer: Self-pay | Admitting: Internal Medicine

## 2013-02-25 ENCOUNTER — Other Ambulatory Visit: Payer: Self-pay | Admitting: Internal Medicine

## 2013-03-24 ENCOUNTER — Ambulatory Visit (INDEPENDENT_AMBULATORY_CARE_PROVIDER_SITE_OTHER): Payer: 59 | Admitting: Podiatry

## 2013-03-24 ENCOUNTER — Encounter: Payer: Self-pay | Admitting: Podiatry

## 2013-03-24 DIAGNOSIS — M779 Enthesopathy, unspecified: Secondary | ICD-10-CM

## 2013-03-24 DIAGNOSIS — Q828 Other specified congenital malformations of skin: Secondary | ICD-10-CM

## 2013-03-24 NOTE — Patient Instructions (Signed)

## 2013-03-25 NOTE — Progress Notes (Signed)
Subjective:     Patient ID: Nicolas Allen, male   DOB: 1955-09-04, 57 y.o.   MRN: [id number]  HPI patient states that his feet are feeling okay. Lesion on the second toe is still quite tender.   Review of Systems  All other systems reviewed and are negative.       Objective:   Physical Exam  Nursing note and vitals reviewed. Cardiovascular: Intact distal pulses.   Musculoskeletal: Normal range of motion.  Neurological: He is alert.  Skin: Skin is warm.   patient has severe keratotic lesion second toe left and underneath the left fifth metatarsal.     Assessment:     Tendinitis along with digital deformity left foot.    Plan:     Orthotics dispensed with instructions. Discuss possible surgery to alleviate pain in the second toe. Reappoint to reevaluate in 6 weeks.

## 2013-04-21 ENCOUNTER — Ambulatory Visit: Payer: 59 | Admitting: Podiatry

## 2013-04-25 ENCOUNTER — Ambulatory Visit: Payer: 59 | Admitting: Podiatry

## 2013-07-12 ENCOUNTER — Other Ambulatory Visit: Payer: Self-pay

## 2013-07-12 DIAGNOSIS — R21 Rash and other nonspecific skin eruption: Secondary | ICD-10-CM

## 2013-07-12 MED ORDER — FLUOCINONIDE-E 0.05 % EX CREA: TOPICAL_CREAM | Freq: Two times a day (BID) | CUTANEOUS | Status: DC

## 2013-08-12 ENCOUNTER — Ambulatory Visit: Payer: 59 | Admitting: Internal Medicine

## 2013-08-16 ENCOUNTER — Encounter: Payer: Self-pay | Admitting: Internal Medicine

## 2013-08-16 ENCOUNTER — Ambulatory Visit (INDEPENDENT_AMBULATORY_CARE_PROVIDER_SITE_OTHER): Payer: 59 | Admitting: Internal Medicine

## 2013-08-16 VITALS — BP 128/64 | HR 68 | Temp 98.1°F | Ht 72.0 in | Wt 149.4 lb

## 2013-08-16 DIAGNOSIS — F411 Generalized anxiety disorder: Secondary | ICD-10-CM

## 2013-08-16 DIAGNOSIS — R7309 Other abnormal glucose: Secondary | ICD-10-CM

## 2013-08-16 DIAGNOSIS — R7302 Impaired glucose tolerance (oral): Secondary | ICD-10-CM

## 2013-08-16 DIAGNOSIS — R21 Rash and other nonspecific skin eruption: Secondary | ICD-10-CM

## 2013-08-16 DIAGNOSIS — I10 Essential (primary) hypertension: Secondary | ICD-10-CM

## 2013-08-16 DIAGNOSIS — Z0000 Encounter for general adult medical examination without abnormal findings: Secondary | ICD-10-CM

## 2013-08-16 DIAGNOSIS — E785 Hyperlipidemia, unspecified: Secondary | ICD-10-CM

## 2013-08-16 MED ORDER — AMLODIPINE BESY-BENAZEPRIL HCL 10-20 MG PO CAPS: 1.0000 | ORAL_CAPSULE | Freq: Every day | ORAL | Status: DC

## 2013-08-16 MED ORDER — FLUOCINONIDE-E 0.05 % EX CREA: TOPICAL_CREAM | Freq: Two times a day (BID) | CUTANEOUS | Status: DC

## 2013-08-16 MED ORDER — KETOCONAZOLE 2 % EX CREA: TOPICAL_CREAM | Freq: Every day | CUTANEOUS | Status: DC

## 2013-08-16 MED ORDER — VARDENAFIL HCL 20 MG PO TABS: 20.0000 mg | ORAL_TABLET | ORAL | Status: DC | PRN

## 2013-08-16 MED ORDER — PRAVASTATIN SODIUM 40 MG PO TABS: 40.0000 mg | ORAL_TABLET | Freq: Every day | ORAL | Status: DC

## 2013-08-16 NOTE — Assessment & Plan Note (Signed)
stable overall by history and exam, recent data reviewed with pt, and pt to continue medical treatment as before,  to f/u any worsening symptoms or concerns Lab Results  Component Value Date   WBC 8.0 02/07/2013   HGB 12.1* 02/07/2013   HCT 37.4* 02/07/2013   PLT 134.0* 02/07/2013   GLUCOSE 86 02/11/2013   CHOL 154 02/11/2013   TRIG 55.0 02/11/2013   HDL 62.40 02/11/2013   LDLCALC 81 02/11/2013   ALT 17 02/11/2013   AST 26 02/11/2013   NA 137 02/11/2013   K 4.0 02/11/2013   CL 102 02/11/2013   CREATININE 0.8 02/11/2013   BUN 10 02/11/2013   CO2 30 02/11/2013   TSH 0.91 02/11/2013   PSA 0.25 02/11/2013   INR 0.98 06/27/2011   HGBA1C 5.5 02/11/2013

## 2013-08-16 NOTE — Assessment & Plan Note (Signed)
stable overall by history and exam, recent data reviewed with pt, and pt to continue medical treatment as before,  to f/u any worsening symptoms or concerns Lab Results  Component Value Date   HGBA1C 5.5 02/11/2013

## 2013-08-16 NOTE — Progress Notes (Signed)
Pre visit review using our clinic review tool, if applicable. No additional management support is needed unless otherwise documented below in the visit note. 

## 2013-08-16 NOTE — Assessment & Plan Note (Signed)
stable overall by history and exam, recent data reviewed with pt, and pt to continue medical treatment as before,  to f/u any worsening symptoms or concerns Lab Results  Component Value Date   LDLCALC 81 02/11/2013

## 2013-08-16 NOTE — Patient Instructions (Signed)
Please continue all other medications as before, and refills have been done if requested. Please have the pharmacy call with any other refills you may need. Please continue your efforts at being more active, low cholesterol diet, and weight control. Please stop smoking  .Please return in 1 year for your yearly visit, or sooner if needed, with Lab testing done 3-5 days before

## 2013-08-16 NOTE — Assessment & Plan Note (Signed)
stable overall by history and exam, recent data reviewed with pt, and pt to continue medical treatment as before,  to f/u any worsening symptoms or concerns BP Readings from Last 3 Encounters:  08/16/13 128/64  02/11/13 112/62  09/16/12 110/72

## 2013-08-16 NOTE — Progress Notes (Signed)
   Subjective:    Patient ID: Nicolas Allen, male    DOB: 1955/11/04, 58 y.o.   MRN: [id number]  HPI  Here to f/u; overall doing ok,  Pt denies chest pain, increased sob or doe, wheezing, orthopnea, PND, increased LE swelling, palpitations, dizziness or syncope.  Pt denies polydipsia, polyuria, or low sugar symptoms such as weakness or confusion improved with po intake.  Pt denies new neurological symptoms such as new headache, or facial or extremity weakness or numbness.   Pt states overall good compliance with meds, has been trying to follow lower cholesterol diet, with wt overall stable,  but little exercise however.  Denies worsening depressive symptoms, suicidal ideation, or panic; has ongoing anxiety, not increased recently.  Past Medical History  Diagnosis Date  . ANXIETY 02/03/2007  . GLUCOSE INTOLERANCE 01/04/2008  . HYPERLIPIDEMIA 02/03/2007  . HYPERTENSION 02/03/2007  . Impaired glucose tolerance 02/11/2011  . Erectile dysfunction 02/11/2011  . GERD (gastroesophageal reflux disease)   . IBS (irritable bowel syndrome)   . Cervical disc disease 02/12/2012    S/p surgury jan 2013  . Anemia 06/24/2012   Past Surgical History  Procedure Laterality Date  . No past surgeries      reports that he has been smoking Cigarettes.  He has a 45 pack-year smoking history. He does not have any smokeless tobacco history on file. He reports that he does not drink alcohol or use illicit drugs. family history includes Cancer in his cousin and mother; Hypertension in his other; Stroke in his other. No Known Allergies Current Outpatient Prescriptions on File Prior to Visit  Medication Sig Dispense Refill  . aspirin 81 MG tablet Take 81 mg by mouth daily.       No current facility-administered medications on file prior to visit.    Review of Systems  Constitutional: Negative for unexpected weight change, or unusual diaphoresis  HENT: Negative for tinnitus.   Eyes: Negative for photophobia and visual  disturbance.  Respiratory: Negative for choking and stridor.   Gastrointestinal: Negative for vomiting and blood in stool.  Genitourinary: Negative for hematuria and decreased urine volume.  Musculoskeletal: Negative for acute joint swelling Skin: Negative for color change and wound.  Neurological: Negative for tremors and numbness other than noted  Psychiatric/Behavioral: Negative for decreased concentration or  hyperactivity.       Objective:   Physical Exam BP 128/64  Pulse 68  Temp(Src) 98.1 F (36.7 C) (Oral)  Ht 6' (1.829 m)  Wt 149 lb 6 oz (67.756 kg)  BMI 20.25 kg/m2  SpO2 98% VS noted,  Constitutional: Pt appears well-developed and well-nourished.  HENT: Head: NCAT.  Right Ear: External ear normal.  Left Ear: External ear normal.  Eyes: Conjunctivae and EOM are normal. Pupils are equal, round, and reactive to light.  Neck: Normal range of motion. Neck supple.  Cardiovascular: Normal rate and regular rhythm.   Pulmonary/Chest: Effort normal and breath sounds normal.  Abd:  Soft, NT, non-distended, + BS Neurological: Pt is alert. Not confused  Skin: Skin is warm. No erythema.  Psychiatric: Pt behavior is normal. Thought content normal.     Assessment & Plan:

## 2014-08-18 ENCOUNTER — Other Ambulatory Visit (INDEPENDENT_AMBULATORY_CARE_PROVIDER_SITE_OTHER): Payer: 59

## 2014-08-18 DIAGNOSIS — R7302 Impaired glucose tolerance (oral): Secondary | ICD-10-CM

## 2014-08-18 DIAGNOSIS — Z0000 Encounter for general adult medical examination without abnormal findings: Secondary | ICD-10-CM

## 2014-08-18 LAB — HEPATIC FUNCTION PANEL
ALT: 9 U/L (ref 0–53)
AST: 18 U/L (ref 0–37)
Albumin: 4.1 g/dL (ref 3.5–5.2)
Alkaline Phosphatase: 61 U/L (ref 39–117)
Bilirubin, Direct: 0.1 mg/dL (ref 0.0–0.3)
Total Bilirubin: 0.4 mg/dL (ref 0.2–1.2)
Total Protein: 7.1 g/dL (ref 6.0–8.3)

## 2014-08-18 LAB — CBC WITH DIFFERENTIAL/PLATELET
Basophils Absolute: 0 10*3/uL (ref 0.0–0.1)
Basophils Relative: 0.4 % (ref 0.0–3.0)
Eosinophils Absolute: 0.3 10*3/uL (ref 0.0–0.7)
Eosinophils Relative: 4.4 % (ref 0.0–5.0)
HCT: 39.2 % (ref 39.0–52.0)
Hemoglobin: 13.1 g/dL (ref 13.0–17.0)
Lymphocytes Relative: 32.3 % (ref 12.0–46.0)
Lymphs Abs: 2.1 10*3/uL (ref 0.7–4.0)
MCHC: 33.4 g/dL (ref 30.0–36.0)
MCV: 83.1 fl (ref 78.0–100.0)
Monocytes Absolute: 0.5 10*3/uL (ref 0.1–1.0)
Monocytes Relative: 7.8 % (ref 3.0–12.0)
Neutro Abs: 3.6 10*3/uL (ref 1.4–7.7)
Neutrophils Relative %: 55.1 % (ref 43.0–77.0)
Platelets: 157 10*3/uL (ref 150.0–400.0)
RBC: 4.72 Mil/uL (ref 4.22–5.81)
RDW: 15.5 % (ref 11.5–15.5)
WBC: 6.5 10*3/uL (ref 4.0–10.5)

## 2014-08-18 LAB — LIPID PANEL
Cholesterol: 159 mg/dL (ref 0–200)
HDL: 54.3 mg/dL (ref >39.00)
LDL Cholesterol: 86 mg/dL (ref 0–99)
NonHDL: 104.7
Total CHOL/HDL Ratio: 3
Triglycerides: 95 mg/dL (ref 0.0–149.0)
VLDL: 19 mg/dL (ref 0.0–40.0)

## 2014-08-18 LAB — BASIC METABOLIC PANEL
BUN: 10 mg/dL (ref 6–23)
CO2: 30 mEq/L (ref 19–32)
Calcium: 9.1 mg/dL (ref 8.4–10.5)
Chloride: 105 mEq/L (ref 96–112)
Creatinine, Ser: 0.95 mg/dL (ref 0.40–1.50)
GFR: 104.91 mL/min (ref >60.00)
Glucose, Bld: 105 mg/dL — ABNORMAL HIGH (ref 70–99)
Potassium: 3.7 mEq/L (ref 3.5–5.1)
Sodium: 138 mEq/L (ref 135–145)

## 2014-08-18 LAB — URINALYSIS, ROUTINE W REFLEX MICROSCOPIC
Bilirubin Urine: NEGATIVE
Hgb urine dipstick: NEGATIVE
Ketones, ur: NEGATIVE
Leukocytes, UA: NEGATIVE
Nitrite: NEGATIVE
Specific Gravity, Urine: 1.025 (ref 1.000–1.030)
Total Protein, Urine: NEGATIVE
Urine Glucose: NEGATIVE
Urobilinogen, UA: 1 (ref 0.0–1.0)
pH: 5.5 (ref 5.0–8.0)

## 2014-08-18 LAB — PSA: PSA: 0.9 ng/mL (ref 0.10–4.00)

## 2014-08-18 LAB — HEMOGLOBIN A1C: Hgb A1c MFr Bld: 5.8 % (ref 4.6–6.5)

## 2014-08-18 LAB — TSH: TSH: 1.13 u[IU]/mL (ref 0.35–4.50)

## 2014-08-19 ENCOUNTER — Other Ambulatory Visit: Payer: Self-pay | Admitting: Internal Medicine

## 2014-08-22 ENCOUNTER — Encounter: Payer: Self-pay | Admitting: Internal Medicine

## 2014-08-22 ENCOUNTER — Ambulatory Visit (INDEPENDENT_AMBULATORY_CARE_PROVIDER_SITE_OTHER): Payer: 59 | Admitting: Internal Medicine

## 2014-08-22 VITALS — BP 118/64 | HR 75 | Temp 98.8°F | Resp 18 | Ht 72.0 in | Wt 152.0 lb

## 2014-08-22 DIAGNOSIS — N529 Male erectile dysfunction, unspecified: Secondary | ICD-10-CM

## 2014-08-22 DIAGNOSIS — Z0000 Encounter for general adult medical examination without abnormal findings: Secondary | ICD-10-CM

## 2014-08-22 DIAGNOSIS — K5909 Other constipation: Secondary | ICD-10-CM | POA: Insufficient documentation

## 2014-08-22 DIAGNOSIS — Z720 Tobacco use: Secondary | ICD-10-CM

## 2014-08-22 DIAGNOSIS — K5900 Constipation, unspecified: Secondary | ICD-10-CM

## 2014-08-22 DIAGNOSIS — F17200 Nicotine dependence, unspecified, uncomplicated: Secondary | ICD-10-CM

## 2014-08-22 DIAGNOSIS — Z87891 Personal history of nicotine dependence: Secondary | ICD-10-CM | POA: Insufficient documentation

## 2014-08-22 HISTORY — DX: Nicotine dependence, unspecified, uncomplicated: F17.200

## 2014-08-22 MED ORDER — AMLODIPINE BESY-BENAZEPRIL HCL 10-20 MG PO CAPS: 1.0000 | ORAL_CAPSULE | Freq: Every day | ORAL | Status: DC

## 2014-08-22 MED ORDER — VARDENAFIL HCL 20 MG PO TABS: 20.0000 mg | ORAL_TABLET | ORAL | Status: DC | PRN

## 2014-08-22 MED ORDER — PRAVASTATIN SODIUM 40 MG PO TABS: 40.0000 mg | ORAL_TABLET | Freq: Every day | ORAL | Status: DC

## 2014-08-22 NOTE — Progress Notes (Signed)
Pre visit review using our clinic review tool, if applicable. No additional management support is needed unless otherwise documented below in the visit note. 

## 2014-08-22 NOTE — Assessment & Plan Note (Signed)
[place] for LDCT for lung cancer screening, if ok with insurance

## 2014-08-22 NOTE — Patient Instructions (Signed)
Please continue all other medications as before, and refills have been done if requested.  Please have the pharmacy call with any other refills you may need.  Please continue your efforts at being more active, low cholesterol diet, and weight control.  You are otherwise up to date with prevention measures today.  Please keep your appointments with your specialists as you may have planned  You will be contacted regarding the referral for: CT scan for the chest to screen for lung cancer  Your blood work and EKG were OK today  Please return in 1 year for your yearly visit, or sooner if needed, with Lab testing done 3-5 days before`

## 2014-08-22 NOTE — Assessment & Plan Note (Signed)
Ok for refill? 

## 2014-08-22 NOTE — Assessment & Plan Note (Signed)

## 2014-08-22 NOTE — Progress Notes (Signed)
Subjective:    Patient ID: Nicolas Allen, male    DOB: 08/13/1955, 59 y.o.   MRN: [id number]  HPI  Here for wellness and f/u;  Overall doing ok;  Pt denies Chest pain, worsening SOB, DOE, wheezing, orthopnea, PND, worsening LE edema, palpitations, dizziness or syncope.  Pt denies neurological change such as new headache, facial or extremity weakness.  Pt denies polydipsia, polyuria, or low sugar symptoms. Pt states overall good compliance with treatment and medications, good tolerability, and has been trying to follow appropriate diet.  Pt denies worsening depressive symptoms, suicidal ideation or panic. No fever, night sweats, wt loss, loss of appetite, or other constitutional symptoms.  Pt states good ability with ADL's, has low fall risk, home safety reviewed and adequate, no other significant changes in hearing or vision, and only occasionally active with exercise.  No current complaints, except does mention has long hx of chronic consipation, not doing well with stool softner alone. Has not tried miralax, currently one BM per wk, and difficult to get started. Has now been smoking 1.5 ppd for 30 yrs, started at 59yo. Hard to quit For start low dose CT lung Ca screening;  Per CMS guidelines, I have determined the eligibility including patient age (40-80), and absence of any signs of lung cancer.  Specific calculation of number of pack years is documented.  Quit smoking years is documented.  Shared decision making engaged today, including a discussion of the benefits and harms of screening, discussion of need for followup with additional testing, risks of over-diagnosis, risk of false-positive screening examinations, and risk of radiation exposure.  Counseling done today on the importance of adherence to annual lunge cancer LDCT screening, importance of quit smoking and remaining quit, and tobacco cessation instructions given.  There is also discussion with patient regarding the impact of comorbidities,  and patient states is able and willing to undergo diagnosis and treatment.   Past Medical History  Diagnosis Date  . ANXIETY 02/03/2007  . GLUCOSE INTOLERANCE 01/04/2008  . HYPERLIPIDEMIA 02/03/2007  . HYPERTENSION 02/03/2007  . Impaired glucose tolerance 02/11/2011  . Erectile dysfunction 02/11/2011  . GERD (gastroesophageal reflux disease)   . IBS (irritable bowel syndrome)   . Cervical disc disease 02/12/2012    S/p surgury jan 2013  . Anemia 06/24/2012   Past Surgical History  Procedure Laterality Date  . No past surgeries      reports that he has been smoking Cigarettes.  He has a 45 pack-year smoking history. He does not have any smokeless tobacco history on file. He reports that he does not drink alcohol or use illicit drugs. family history includes Cancer in his cousin and mother; Hypertension in his other; Stroke in his other. No Known Allergies Current Outpatient Prescriptions on File Prior to Visit  Medication Sig Dispense Refill  . amLODipine-benazepril (LOTREL) 10-20 MG per capsule TAKE ONE CAPSULE BY MOUTH ONCE DAILY 90 capsule 0  . aspirin 81 MG tablet Take 81 mg by mouth daily.    . fluocinonide-emollient (LIDEX-E) 0.05 % cream Apply topically 2 (two) times daily. 30 g 1  . ketoconazole (NIZORAL) 2 % cream Apply topically daily. 15 g 0  . pravastatin (PRAVACHOL) 40 MG tablet Take 1 tablet (40 mg total) by mouth daily. 90 tablet 3  . vardenafil (LEVITRA) 20 MG tablet Take 1 tablet (20 mg total) by mouth as needed for erectile dysfunction. 10 tablet 11   No current facility-administered medications on file prior to visit.  Review of Systems Constitutional: Negative for increased diaphoresis, other activity, appetite or siginficant weight change other than noted HENT: Negative for worsening hearing loss, ear pain, facial swelling, mouth sores and neck stiffness.   Eyes: Negative for other worsening pain, redness or visual disturbance.  Respiratory: Negative for shortness  of breath and wheezing  Cardiovascular: Negative for chest pain and palpitations.  Gastrointestinal: Negative for diarrhea, blood in stool, abdominal distention or other pain Genitourinary: Negative for hematuria, flank pain or change in urine volume.  Musculoskeletal: Negative for myalgias or other joint complaints.  Skin: Negative for color change and wound or drainage.  Neurological: Negative for syncope and numbness. other than noted Hematological: Negative for adenopathy. or other swelling Psychiatric/Behavioral: Negative for hallucinations, SI, self-injury, decreased concentration or other worsening agitation.      Objective:   Physical Exam BP 118/64 mmHg  Pulse 75  Temp(Src) 98.8 F (37.1 C) (Oral)  Resp 18  Ht 6' (1.829 m)  Wt 152 lb (68.947 kg)  BMI 20.61 kg/m2  SpO2 99% VS noted,  Constitutional: Pt is oriented to person, place, and time. Appears well-developed and well-nourished, in no significant distress Head: Normocephalic and atraumatic.  Right Ear: External ear normal.  Left Ear: External ear normal.  Nose: Nose normal.  Mouth/Throat: Oropharynx is clear and moist.  Eyes: Conjunctivae and EOM are normal. Pupils are equal, round, and reactive to light.  Neck: Normal range of motion. Neck supple. No JVD present. No tracheal deviation present or significant neck LA or mass Cardiovascular: Normal rate, regular rhythm, normal heart sounds and intact distal pulses.   Pulmonary/Chest: Effort normal and breath sounds without rales or wheezing  Abdominal: Soft. Bowel sounds are normal. NT. No HSM  Musculoskeletal: Normal range of motion. Exhibits no edema.  Lymphadenopathy:  Has no cervical adenopathy.  Neurological: Pt is alert and oriented to person, place, and time. Pt has normal reflexes. No cranial nerve deficit. Motor grossly intact Skin: Skin is warm and dry. No rash noted.  Psychiatric:  Has normal mood and affect. Behavior is normal.     Assessment & Plan:

## 2014-08-22 NOTE — Assessment & Plan Note (Signed)
Ok to try miralax asd daily

## 2014-08-23 ENCOUNTER — Telehealth: Payer: Self-pay | Admitting: Internal Medicine

## 2014-08-23 NOTE — Telephone Encounter (Signed)
emmi mailed  °

## 2014-11-22 ENCOUNTER — Ambulatory Visit (INDEPENDENT_AMBULATORY_CARE_PROVIDER_SITE_OTHER): Payer: 59 | Admitting: Internal Medicine

## 2014-11-22 ENCOUNTER — Encounter: Payer: Self-pay | Admitting: Internal Medicine

## 2014-11-22 VITALS — BP 130/76 | HR 78 | Temp 98.1°F | Wt 146.0 lb

## 2014-11-22 DIAGNOSIS — W57XXXA Bitten or stung by nonvenomous insect and other nonvenomous arthropods, initial encounter: Secondary | ICD-10-CM

## 2014-11-22 DIAGNOSIS — I10 Essential (primary) hypertension: Secondary | ICD-10-CM

## 2014-11-22 DIAGNOSIS — L089 Local infection of the skin and subcutaneous tissue, unspecified: Secondary | ICD-10-CM | POA: Diagnosis not present

## 2014-11-22 DIAGNOSIS — N529 Male erectile dysfunction, unspecified: Secondary | ICD-10-CM | POA: Diagnosis not present

## 2014-11-22 DIAGNOSIS — T148 Other injury of unspecified body region: Secondary | ICD-10-CM | POA: Diagnosis not present

## 2014-11-22 MED ORDER — VARDENAFIL HCL 20 MG PO TABS: 20.0000 mg | ORAL_TABLET | ORAL | Status: DC | PRN

## 2014-11-22 MED ORDER — DOXYCYCLINE HYCLATE 100 MG PO TABS: 100.0000 mg | ORAL_TABLET | Freq: Two times a day (BID) | ORAL | Status: DC

## 2014-11-22 NOTE — Patient Instructions (Signed)
Please take all new medication as prescribed - the antibiotic  Please continue all other medications as before, and refills have been done if requested - the Levitra  Please have the pharmacy call with any other refills you may need.  Please keep your appointments with your specialists as you may have planned

## 2014-11-22 NOTE — Assessment & Plan Note (Signed)
stable overall by history and exam, recent data reviewed with pt, and pt to continue medical treatment as before,  to f/u any worsening symptoms or concerns  BP Readings from Last 3 Encounters:  11/22/14 130/76  08/22/14 118/64  08/16/13 128/64

## 2014-11-22 NOTE — Progress Notes (Signed)
Subjective:    Patient ID: Nicolas Allen, male    DOB: 28-Mar-1956, 59 y.o.   MRN: [id number]  HPI  Here with onset tick bite to right post/med prox thigh found this am, tick engorged, removed whole, but now with worsening site swelling/redness,sharp pain mild without fever, drainage or red streaks,  No rash, joint pain, chills.    Has ongoing ED symtpoms, asks for levitra rx.  Pt denies chest pain, increased sob or doe, wheezing, orthopnea, PND, increased LE swelling, palpitations, dizziness or syncope.  Pt denies new neurological symptoms such as new headache, or facial or extremity weakness or numbness Past Medical History  Diagnosis Date  . ANXIETY 02/03/2007  . GLUCOSE INTOLERANCE 01/04/2008  . HYPERLIPIDEMIA 02/03/2007  . HYPERTENSION 02/03/2007  . Impaired glucose tolerance 02/11/2011  . Erectile dysfunction 02/11/2011  . GERD (gastroesophageal reflux disease)   . IBS (irritable bowel syndrome)   . Cervical disc disease 02/12/2012    S/p surgury jan 2013  . Anemia 06/24/2012  . Smoker 08/22/2014   Past Surgical History  Procedure Laterality Date  . No past surgeries      reports that he has been smoking Cigarettes.  He has a 45 pack-year smoking history. He does not have any smokeless tobacco history on file. He reports that he does not drink alcohol or use illicit drugs. family history includes Cancer in his cousin and mother; Hypertension in his other; Stroke in his other. No Known Allergies Current Outpatient Prescriptions on File Prior to Visit  Medication Sig Dispense Refill  . amLODipine-benazepril (LOTREL) 10-20 MG per capsule Take 1 capsule by mouth daily. 90 capsule 3  . aspirin 81 MG tablet Take 81 mg by mouth daily.    . fluocinonide-emollient (LIDEX-E) 0.05 % cream Apply topically 2 (two) times daily. 30 g 1  . ketoconazole (NIZORAL) 2 % cream Apply topically daily. 15 g 0  . pravastatin (PRAVACHOL) 40 MG tablet Take 1 tablet (40 mg total) by mouth daily. 90 tablet 3   No  current facility-administered medications on file prior to visit.    Review of Systems   Constitutional: Negative for unusual diaphoresis or night sweats HENT: Negative for ringing in ear or discharge Eyes: Negative for double vision or worsening visual disturbance.  Respiratory: Negative for choking and stridor.   Gastrointestinal: Negative for vomiting or other signifcant bowel change Genitourinary: Negative for hematuria or change in urine volume.  Musculoskeletal: Negative for other MSK pain or swelling Skin: Negative for color change and worsening wound.  Neurological: Negative for tremors and numbness other than noted  Psychiatric/Behavioral: Negative for decreased concentration or agitation other than above       Objective:   Physical Exam BP 130/76 mmHg  Pulse 78  Temp(Src) 98.1 F (36.7 C) (Oral)  Wt 146 lb 0.6 oz (66.243 kg)  SpO2 99% VS noted,  Constitutional: Pt appears in no significant distress HENT: Head: NCAT.  Right Ear: External ear normal.  Left Ear: External ear normal.  Eyes: . Pupils are equal, round, and reactive to light. Conjunctivae and EOM are normal Neck: Normal range of motion. Neck supple.  Cardiovascular: Normal rate and regular rhythm.   Pulmonary/Chest: Effort normal and breath sounds without rales or wheezing.  Neurological: Pt is alert. Not confused , motor grossly intact Skin: Skin is warm. No rash, no LE edema but with right post.med thigh with 1.5 cm area red/tender/sweling/induration without drainage Psychiatric: Pt behavior is normal. No agitation.  Assessment & Plan:

## 2014-11-22 NOTE — Progress Notes (Signed)
Pre visit review using our clinic review tool, if applicable. No additional management support is needed unless otherwise documented below in the visit note. 

## 2014-11-22 NOTE — Assessment & Plan Note (Signed)
Mild to mod, for antibx course,  to f/u any worsening symptoms or concerns 

## 2014-11-22 NOTE — Assessment & Plan Note (Signed)
Persistent/ ? Worsening, ok for levitra refill,  to f/u any worsening symptoms or concerns

## 2015-09-02 ENCOUNTER — Other Ambulatory Visit: Payer: Self-pay | Admitting: Internal Medicine

## 2015-09-19 ENCOUNTER — Other Ambulatory Visit: Payer: Self-pay | Admitting: Internal Medicine

## 2015-09-19 ENCOUNTER — Other Ambulatory Visit (INDEPENDENT_AMBULATORY_CARE_PROVIDER_SITE_OTHER): Payer: 59

## 2015-09-19 DIAGNOSIS — Z0000 Encounter for general adult medical examination without abnormal findings: Secondary | ICD-10-CM | POA: Diagnosis not present

## 2015-09-19 DIAGNOSIS — D649 Anemia, unspecified: Secondary | ICD-10-CM

## 2015-09-19 LAB — LIPID PANEL
Cholesterol: 144 mg/dL (ref 0–200)
HDL: 51.2 mg/dL (ref >39.00)
LDL Cholesterol: 80 mg/dL (ref 0–99)
NonHDL: 92.73
Total CHOL/HDL Ratio: 3
Triglycerides: 63 mg/dL (ref 0.0–149.0)
VLDL: 12.6 mg/dL (ref 0.0–40.0)

## 2015-09-19 LAB — BASIC METABOLIC PANEL
BUN: 12 mg/dL (ref 6–23)
CO2: 31 mEq/L (ref 19–32)
Calcium: 9 mg/dL (ref 8.4–10.5)
Chloride: 105 mEq/L (ref 96–112)
Creatinine, Ser: 0.98 mg/dL (ref 0.40–1.50)
GFR: 100.83 mL/min (ref >60.00)
Glucose, Bld: 90 mg/dL (ref 70–99)
Potassium: 4.1 mEq/L (ref 3.5–5.1)
Sodium: 141 mEq/L (ref 135–145)

## 2015-09-19 LAB — CBC WITH DIFFERENTIAL/PLATELET
Basophils Absolute: 0 10*3/uL (ref 0.0–0.1)
Basophils Relative: 0.5 % (ref 0.0–3.0)
Eosinophils Absolute: 0.4 10*3/uL (ref 0.0–0.7)
Eosinophils Relative: 6.9 % — ABNORMAL HIGH (ref 0.0–5.0)
HCT: 35.9 % — ABNORMAL LOW (ref 39.0–52.0)
Hemoglobin: 11.7 g/dL — ABNORMAL LOW (ref 13.0–17.0)
Lymphocytes Relative: 39.9 % (ref 12.0–46.0)
Lymphs Abs: 2.6 10*3/uL (ref 0.7–4.0)
MCHC: 32.7 g/dL (ref 30.0–36.0)
MCV: 84 fl (ref 78.0–100.0)
Monocytes Absolute: 0.6 10*3/uL (ref 0.1–1.0)
Monocytes Relative: 8.8 % (ref 3.0–12.0)
Neutro Abs: 2.9 10*3/uL (ref 1.4–7.7)
Neutrophils Relative %: 43.9 % (ref 43.0–77.0)
Platelets: 143 10*3/uL — ABNORMAL LOW (ref 150.0–400.0)
RBC: 4.27 Mil/uL (ref 4.22–5.81)
RDW: 14.6 % (ref 11.5–15.5)
WBC: 6.5 10*3/uL (ref 4.0–10.5)

## 2015-09-19 LAB — HEPATIC FUNCTION PANEL
ALT: 7 U/L (ref 0–53)
AST: 14 U/L (ref 0–37)
Albumin: 3.9 g/dL (ref 3.5–5.2)
Alkaline Phosphatase: 46 U/L (ref 39–117)
Bilirubin, Direct: 0.1 mg/dL (ref 0.0–0.3)
Total Bilirubin: 0.4 mg/dL (ref 0.2–1.2)
Total Protein: 6.5 g/dL (ref 6.0–8.3)

## 2015-09-19 LAB — URINALYSIS, ROUTINE W REFLEX MICROSCOPIC
Bilirubin Urine: NEGATIVE
Hgb urine dipstick: NEGATIVE
Leukocytes, UA: NEGATIVE
Nitrite: NEGATIVE
RBC / HPF: NONE SEEN (ref 0–?)
Specific Gravity, Urine: 1.02 (ref 1.000–1.030)
Total Protein, Urine: NEGATIVE
Urine Glucose: NEGATIVE
Urobilinogen, UA: 2 — AB (ref 0.0–1.0)
pH: 6 (ref 5.0–8.0)

## 2015-09-19 LAB — PSA: PSA: 0.38 ng/mL (ref 0.10–4.00)

## 2015-09-19 LAB — TSH: TSH: 1.07 u[IU]/mL (ref 0.35–4.50)

## 2015-09-21 ENCOUNTER — Other Ambulatory Visit (INDEPENDENT_AMBULATORY_CARE_PROVIDER_SITE_OTHER): Payer: 59

## 2015-09-21 ENCOUNTER — Encounter: Payer: Self-pay | Admitting: Internal Medicine

## 2015-09-21 ENCOUNTER — Ambulatory Visit (INDEPENDENT_AMBULATORY_CARE_PROVIDER_SITE_OTHER): Payer: 59 | Admitting: Internal Medicine

## 2015-09-21 VITALS — BP 120/70 | HR 73 | Temp 97.9°F | Ht 72.0 in | Wt 150.0 lb

## 2015-09-21 DIAGNOSIS — Z0001 Encounter for general adult medical examination with abnormal findings: Secondary | ICD-10-CM | POA: Diagnosis not present

## 2015-09-21 DIAGNOSIS — I10 Essential (primary) hypertension: Secondary | ICD-10-CM

## 2015-09-21 DIAGNOSIS — Z0000 Encounter for general adult medical examination without abnormal findings: Secondary | ICD-10-CM

## 2015-09-21 DIAGNOSIS — E785 Hyperlipidemia, unspecified: Secondary | ICD-10-CM

## 2015-09-21 DIAGNOSIS — R7302 Impaired glucose tolerance (oral): Secondary | ICD-10-CM | POA: Diagnosis not present

## 2015-09-21 DIAGNOSIS — F411 Generalized anxiety disorder: Secondary | ICD-10-CM

## 2015-09-21 DIAGNOSIS — Z720 Tobacco use: Secondary | ICD-10-CM

## 2015-09-21 DIAGNOSIS — D649 Anemia, unspecified: Secondary | ICD-10-CM | POA: Diagnosis not present

## 2015-09-21 DIAGNOSIS — R6889 Other general symptoms and signs: Secondary | ICD-10-CM | POA: Diagnosis not present

## 2015-09-21 DIAGNOSIS — Z1159 Encounter for screening for other viral diseases: Secondary | ICD-10-CM

## 2015-09-21 DIAGNOSIS — F17200 Nicotine dependence, unspecified, uncomplicated: Secondary | ICD-10-CM

## 2015-09-21 LAB — IBC PANEL
Iron: 64 ug/dL (ref 42–165)
Saturation Ratios: 22 % (ref 20.0–50.0)
Transferrin: 208 mg/dL — ABNORMAL LOW (ref 212.0–360.0)

## 2015-09-21 MED ORDER — VARDENAFIL HCL 20 MG PO TABS: 20.0000 mg | ORAL_TABLET | ORAL | Status: DC | PRN

## 2015-09-21 NOTE — Assessment & Plan Note (Addendum)
stable overall by history and exam, recent data reviewed with pt, and pt to continue medical treatment as before,  to f/u any worsening symptoms or concerns BP Readings from Last 3 Encounters:  09/21/15 120/70  11/22/14 130/76  08/22/14 118/64   In addition to the time spent performing CPE, I spent an additional 40 minutes face to face,in which greater than 50% of this time was spent in counseling and coordination of care for patient's acute illness as documented.

## 2015-09-21 NOTE — Assessment & Plan Note (Signed)
stable overall by history and exam, recent data reviewed with pt, and pt to continue medical treatment as before,  to f/u any worsening symptoms or concerns Lab Results  Component Value Date   HGBA1C 5.8 08/18/2014

## 2015-09-21 NOTE — Assessment & Plan Note (Signed)

## 2015-09-21 NOTE — Progress Notes (Signed)
Subjective:    Patient ID: Nicolas Allen, male    DOB: 1955/08/14, 60 y.o.   MRN: [id number]  HPI  Here for wellness and f/u;  Overall doing ok;  Pt denies Chest pain, worsening SOB, DOE, wheezing, orthopnea, PND, worsening LE edema, palpitations, dizziness or syncope.  Pt denies neurological change such as new headache, facial or extremity weakness.  Pt denies polydipsia, polyuria, or low sugar symptoms. Pt states overall good compliance with treatment and medications, good tolerability, and has been trying to follow appropriate diet.  Pt denies worsening depressive symptoms, suicidal ideation or panic. No fever, night sweats, wt loss, loss of appetite, or other constitutional symptoms.  Pt states good ability with ADL's, has low fall risk, home safety reviewed and adequate, no other significant changes in hearing or vision, and only occasionally active with exercise.  No current complaints. Still smoking, not ready to quit, but may be forced to as he no longer gets free cigarrettes through his wife's work Past Medical History  Diagnosis Date  . ANXIETY 02/03/2007  . GLUCOSE INTOLERANCE 01/04/2008  . HYPERLIPIDEMIA 02/03/2007  . HYPERTENSION 02/03/2007  . Impaired glucose tolerance 02/11/2011  . Erectile dysfunction 02/11/2011  . GERD (gastroesophageal reflux disease)   . IBS (irritable bowel syndrome)   . Cervical disc disease 02/12/2012    S/p surgury jan 2013  . Anemia 06/24/2012  . Smoker 08/22/2014   Past Surgical History  Procedure Laterality Date  . No past surgeries      reports that he has been smoking Cigarettes.  He has a 45 pack-year smoking history. He does not have any smokeless tobacco history on file. He reports that he does not drink alcohol or use illicit drugs. family history includes Cancer in his cousin and mother; Hypertension in his other; Stroke in his other. No Known Allergies Current Outpatient Prescriptions on File Prior to Visit  Medication Sig Dispense Refill  .  amLODipine-benazepril (LOTREL) 10-20 MG capsule Take 1 capsule by mouth daily. Yearly physical w/labs is due must see MD for refills 30 capsule 0  . aspirin 81 MG tablet Take 81 mg by mouth daily.    . fluocinonide-emollient (LIDEX-E) 0.05 % cream Apply topically 2 (two) times daily. 30 g 1  . ketoconazole (NIZORAL) 2 % cream Apply topically daily. 15 g 0  . pravastatin (PRAVACHOL) 40 MG tablet Take 1 tablet (40 mg total) by mouth daily. Yearly physical is due must see md for refills 30 tablet 0   No current facility-administered medications on file prior to visit.   Review of Systems Constitutional: Negative for increased diaphoresis, or other activity, appetite or siginficant weight change other than noted HENT: Negative for worsening hearing loss, ear pain, facial swelling, mouth sores and neck stiffness.   Eyes: Negative for other worsening pain, redness or visual disturbance.  Respiratory: Negative for choking or stridor Cardiovascular: Negative for other chest pain and palpitations.  Gastrointestinal: Negative for worsening diarrhea, blood in stool, or abdominal distention Genitourinary: Negative for hematuria, flank pain or change in urine volume.  Musculoskeletal: Negative for myalgias or other joint complaints.  Skin: Negative for other color change and wound or drainage.  Neurological: Negative for syncope and numbness. other than noted Hematological: Negative for adenopathy. or other swelling Psychiatric/Behavioral: Negative for hallucinations, SI, self-injury, decreased concentration or other worsening agitation.      Objective:   Physical Exam BP 120/70 mmHg  Pulse 73  Temp(Src) 97.9 F (36.6 C) (Oral)  Ht 6' (  1.829 m)  Wt 150 lb (68.04 kg)  BMI 20.34 kg/m2  SpO2 96% VS noted,  Constitutional: Pt is oriented to person, place, and time. Appears well-developed and well-nourished, in no significant distress Head: Normocephalic and atraumatic  Eyes: Conjunctivae and EOM  are normal. Pupils are equal, round, and reactive to light Right Ear: External ear normal.  Left Ear: External ear normal Nose: Nose normal.  Mouth/Throat: Oropharynx is clear and moist  Neck: Normal range of motion. Neck supple. No JVD present. No tracheal deviation present or significant neck LA or mass Cardiovascular: Normal rate, regular rhythm, normal heart sounds and intact distal pulses.   Pulmonary/Chest: Effort normal and breath sounds without rales or wheezing  Abdominal: Soft. Bowel sounds are normal. NT. No HSM  Musculoskeletal: Normal range of motion. Exhibits no edema Lymphadenopathy: Has no cervical adenopathy.  Neurological: Pt is alert and oriented to person, place, and time. Pt has normal reflexes. No cranial nerve deficit. Motor grossly intact Skin: Skin is warm and dry. No rash noted or new ulcers Psychiatric:  Has mild nervous mood and affect. Behavior is normal.    Assessment & Plan:

## 2015-09-21 NOTE — Assessment & Plan Note (Signed)
Urged to quit,d/w pt chantix, but declines for now

## 2015-09-21 NOTE — Assessment & Plan Note (Signed)
stable overall by history and exam, recent data reviewed with pt, and pt to continue medical treatment as before,  to f/u any worsening symptoms or concerns Lab Results  Component Value Date   WBC 6.5 09/19/2015   HGB 11.7* 09/19/2015   HCT 35.9* 09/19/2015   PLT 143.0* 09/19/2015   GLUCOSE 90 09/19/2015   CHOL 144 09/19/2015   TRIG 63.0 09/19/2015   HDL 51.20 09/19/2015   LDLCALC 80 09/19/2015   ALT 7 09/19/2015   AST 14 09/19/2015   NA 141 09/19/2015   K 4.1 09/19/2015   CL 105 09/19/2015   CREATININE 0.98 09/19/2015   BUN 12 09/19/2015   CO2 31 09/19/2015   TSH 1.07 09/19/2015   PSA 0.38 09/19/2015   INR 0.98 06/27/2011   HGBA1C 5.8 08/18/2014   Pt declines need for tx or counseling at this time

## 2015-09-21 NOTE — Patient Instructions (Signed)
Please continue all other medications as before, and refills have been done if requested.  Please have the pharmacy call with any other refills you may need.  Please continue your efforts at being more active, low cholesterol diet, and weight control.  You are otherwise up to date with prevention measures today.  Please keep your appointments with your specialists as you may have planned  You will be contacted regarding the referral for: colonoscopy  Please stop smoking  Please return in 1 year for your yearly visit, or sooner if needed, with Lab testing done 3-5 days before

## 2015-09-21 NOTE — Progress Notes (Signed)
Pre visit review using our clinic review tool, if applicable. No additional management support is needed unless otherwise documented below in the visit note. 

## 2015-09-21 NOTE — Assessment & Plan Note (Signed)
stable overall by history and exam, recent data reviewed with pt, and pt to continue medical treatment as before,  to f/u any worsening symptoms or concerns Lab Results  Component Value Date   LDLCALC 80 09/19/2015   To cont statin

## 2015-09-25 ENCOUNTER — Encounter: Payer: Self-pay | Admitting: Gastroenterology

## 2015-10-08 ENCOUNTER — Other Ambulatory Visit: Payer: Self-pay | Admitting: Internal Medicine

## 2015-11-09 ENCOUNTER — Other Ambulatory Visit: Payer: Self-pay | Admitting: Internal Medicine

## 2015-11-26 ENCOUNTER — Ambulatory Visit (AMBULATORY_SURGERY_CENTER): Payer: Self-pay

## 2015-11-26 VITALS — Ht 72.0 in | Wt 148.0 lb

## 2015-11-26 DIAGNOSIS — Z1211 Encounter for screening for malignant neoplasm of colon: Secondary | ICD-10-CM

## 2015-11-26 MED ORDER — NA SULFATE-K SULFATE-MG SULF 17.5-3.13-1.6 GM/177ML PO SOLN: 1.0000 | Freq: Once | ORAL | Status: DC

## 2015-11-26 NOTE — Progress Notes (Signed)
No egg or soy allergy.  No previous complications from anesthesia. No home O2. No diet meds. 

## 2015-11-27 ENCOUNTER — Encounter: Payer: Self-pay | Admitting: Gastroenterology

## 2015-12-10 ENCOUNTER — Encounter: Payer: Self-pay | Admitting: Gastroenterology

## 2015-12-10 ENCOUNTER — Ambulatory Visit (AMBULATORY_SURGERY_CENTER): Payer: 59 | Admitting: Gastroenterology

## 2015-12-10 VITALS — BP 125/71 | HR 61 | Temp 98.0°F | Resp 24 | Ht 72.0 in | Wt 148.0 lb

## 2015-12-10 DIAGNOSIS — R152 Fecal urgency: Secondary | ICD-10-CM

## 2015-12-10 DIAGNOSIS — Z1211 Encounter for screening for malignant neoplasm of colon: Secondary | ICD-10-CM | POA: Diagnosis present

## 2015-12-10 MED ORDER — SODIUM CHLORIDE 0.9 % IV SOLN: 500.0000 mL | INTRAVENOUS | Status: DC

## 2015-12-10 NOTE — Progress Notes (Signed)
Called to room to assist during endoscopic procedure.  Patient ID and intended procedure confirmed with present staff. Received instructions for my participation in the procedure from the performing physician.  

## 2015-12-10 NOTE — Patient Instructions (Signed)
YOU HAD AN ENDOSCOPIC PROCEDURE TODAY AT [facility]:   Refer to the procedure report that was given to you for any specific questions about what was found during the examination.  If the procedure report does not answer your questions, please call your gastroenterologist to clarify.  If you requested that your care partner not be given the details of your procedure findings, then the procedure report has been included in a sealed envelope for you to review at your convenience later.  YOU SHOULD EXPECT: Some feelings of bloating in the abdomen. Passage of more gas than usual.  Walking can help get rid of the air that was put into your GI tract during the procedure and reduce the bloating. If you had a lower endoscopy (such as a colonoscopy or flexible sigmoidoscopy) you may notice spotting of blood in your stool or on the toilet paper. If you underwent a bowel prep for your procedure, you may not have a normal bowel movement for a few days.  Please Note:  You might notice some irritation and congestion in your nose or some drainage.  This is from the oxygen used during your procedure.  There is no need for concern and it should clear up in a day or so.  SYMPTOMS TO REPORT IMMEDIATELY:   Following lower endoscopy (colonoscopy or flexible sigmoidoscopy):  Excessive amounts of blood in the stool  Significant tenderness or worsening of abdominal pains  Swelling of the abdomen that is new, acute  Fever of 100F or higher   For urgent or emergent issues, a gastroenterologist can be reached at any hour by calling [phone].   DIET: Your first meal following the procedure should be a small meal and then it is ok to progress to your normal diet. Heavy or fried foods are harder to digest and may make you feel nauseous or bloated.  Likewise, meals heavy in dairy and vegetables can increase bloating.  Drink plenty of fluids but you should avoid alcoholic beverages for 24  hours.  ACTIVITY:  You should plan to take it easy for the rest of today and you should NOT DRIVE or use heavy machinery until tomorrow (because of the sedation medicines used during the test).    FOLLOW UP: Our staff will call the number listed on your records the next business day following your procedure to check on you and address any questions or concerns that you may have regarding the information given to you following your procedure. If we do not reach you, we will leave a message.  However, if you are feeling well and you are not experiencing any problems, there is no need to return our call.  We will assume that you have returned to your regular daily activities without incident.  If any biopsies were taken you will be contacted by phone or by letter within the next 1-3 weeks.  Please call us at [phone] if you have not heard about the biopsies in 3 weeks.    SIGNATURES/CONFIDENTIALITY: You and/or your care partner have signed paperwork which will be entered into your electronic medical record.  These signatures attest to the fact that that the information above on your After Visit Summary has been reviewed and is understood.  Full responsibility of the confidentiality of this discharge information lies with you and/or your care-partner.  Await pathology results. Repeat colonoscopy in 10 years-2027. Hemorrhoid handout provided.

## 2015-12-10 NOTE — Progress Notes (Signed)
Report given to PACU RN, vss 

## 2015-12-10 NOTE — Op Note (Signed)
[facility] Patient Name: Nicolas Allen Procedure Date: 12/10/2015 10:31 AM MRN: [id number] Endoscopist: Remo Lipps P. Havery Moros , MD Age: 60 Referring MD:  Date of Birth: Jan 24, 1956 Gender: Male Account #: [id number] Procedure:                Colonoscopy Indications:              Screening for malignant neoplasm in the colon,                            normal exam 10 years ago. Patient incidentally                            endorses increased urgency of stools and chronic                            loose stools Medicines:                Monitored Anesthesia Care Procedure:                Pre-Anesthesia Assessment:                           - Prior to the procedure, a History and Physical                            was performed, and patient medications and                            allergies were reviewed. The patient's tolerance of                            previous anesthesia was also reviewed. The risks                            and benefits of the procedure and the sedation                            options and risks were discussed with the patient.                            All questions were answered, and informed consent                            was obtained. Prior Anticoagulants: The patient has                            taken aspirin, last dose was 1 day prior to                            procedure. ASA Grade Assessment: II - A patient                            with mild systemic disease. After reviewing the  risks and benefits, the patient was deemed in                            satisfactory condition to undergo the procedure.                           After obtaining informed consent, the colonoscope                            was passed under direct vision. Throughout the                            procedure, the patient's blood pressure, pulse, and                            oxygen saturations were monitored continuously. The                             Model CF-HQ190L [id number]) scope was introduced                            through the anus and advanced to the the terminal                            ileum, with identification of the appendiceal                            orifice and IC valve. The colonoscopy was                            technically difficult and complex due to a tortuous                            colon. The patient tolerated the procedure well.                            The quality of the bowel preparation was adequate.                            The terminal ileum, ileocecal valve, appendiceal                            orifice, and rectum were photographed. Scope In: 10:40:07 AM Scope Out: 10:59:21 AM Scope Withdrawal Time: 0 hours 13 minutes 6 seconds  Total Procedure Duration: 0 hours 19 minutes 14 seconds  Findings:                 The perianal and digital rectal examinations were                            normal.                           The terminal ileum appeared normal.  The colon was tortuous.                           Non-bleeding internal hemorrhoids were found during                            retroflexion.                           The exam was otherwise without abnormality. No                            overt inflammatory changes noted.                           Biopsies for histology were taken with a cold                            forceps from the right colon and left colon for                            evaluation of microscopic colitis. Complications:            No immediate complications. Estimated blood loss:                            Minimal. Estimated Blood Loss:     Estimated blood loss was minimal. Impression:               - The examined portion of the ileum was normal.                           - Tortuous colon.                           - Non-bleeding internal hemorrhoids.                           - The examination was  otherwise normal.                           - Biopsies were taken with a cold forceps from the                            right colon and left colon for evaluation of                            microscopic colitis. Recommendation:           - Patient has a contact number available for                            emergencies. The signs and symptoms of potential                            delayed complications were discussed with the  patient. Return to normal activities tomorrow.                            Written discharge instructions were provided to the                            patient.                           - Resume previous diet.                           - Continue present medications.                           - Await pathology results.                           - Repeat colonoscopy in 10 years for screening                            purposes. I would recommend using a pediatric                            colonoscope during the patient's next exam due to                            tortous colon. Remo Lipps P. Dhara Schepp, MD 12/10/2015 11:03:30 AM This report has been signed electronically.

## 2015-12-11 ENCOUNTER — Telehealth: Payer: Self-pay

## 2015-12-11 NOTE — Telephone Encounter (Signed)
  Follow up Call-  Call back number 12/10/2015  Post procedure Call Back phone  # [phone]  Permission to leave phone message Yes     Patient questions:  Do you have a fever, pain , or abdominal swelling? No. Pain Score  0 *  Have you tolerated food without any problems? Yes.    Have you been able to return to your normal activities? Yes.    Do you have any questions about your discharge instructions: Diet   No. Medications  No. Follow up visit  No.  Do you have questions or concerns about your Care? No.  Actions: * If pain score is 4 or above: No action needed, pain <4.

## 2015-12-12 ENCOUNTER — Telehealth: Payer: Self-pay

## 2015-12-12 ENCOUNTER — Other Ambulatory Visit: Payer: Self-pay | Admitting: Internal Medicine

## 2015-12-12 MED ORDER — PRAVASTATIN SODIUM 40 MG PO TABS: ORAL_TABLET | ORAL | Status: DC

## 2015-12-12 MED ORDER — VARDENAFIL HCL 20 MG PO TABS
20.0000 mg | ORAL_TABLET | ORAL | Status: DC | PRN
Start: 2015-12-12 — End: 2017-09-25

## 2015-12-12 MED ORDER — AMLODIPINE BESY-BENAZEPRIL HCL 10-20 MG PO CAPS: 30.0000 | ORAL_CAPSULE | Freq: Every day | ORAL | Status: DC

## 2015-12-12 NOTE — Telephone Encounter (Signed)
Medication refills sent to pharmacy 

## 2015-12-14 ENCOUNTER — Encounter: Payer: Self-pay | Admitting: Gastroenterology

## 2016-01-09 ENCOUNTER — Other Ambulatory Visit: Payer: Self-pay | Admitting: Internal Medicine

## 2016-01-09 NOTE — Telephone Encounter (Signed)
Routine to corrine

## 2016-02-11 ENCOUNTER — Other Ambulatory Visit: Payer: Self-pay | Admitting: Internal Medicine

## 2016-05-30 ENCOUNTER — Other Ambulatory Visit: Payer: Self-pay | Admitting: Internal Medicine

## 2016-09-22 ENCOUNTER — Other Ambulatory Visit (INDEPENDENT_AMBULATORY_CARE_PROVIDER_SITE_OTHER): Payer: 59

## 2016-09-22 DIAGNOSIS — Z1159 Encounter for screening for other viral diseases: Secondary | ICD-10-CM

## 2016-09-22 DIAGNOSIS — Z0000 Encounter for general adult medical examination without abnormal findings: Secondary | ICD-10-CM

## 2016-09-22 DIAGNOSIS — R7302 Impaired glucose tolerance (oral): Secondary | ICD-10-CM | POA: Diagnosis not present

## 2016-09-22 LAB — CBC WITH DIFFERENTIAL/PLATELET
Basophils Absolute: 0.1 10*3/uL (ref 0.0–0.1)
Basophils Relative: 0.9 % (ref 0.0–3.0)
Eosinophils Absolute: 0.2 10*3/uL (ref 0.0–0.7)
Eosinophils Relative: 2.2 % (ref 0.0–5.0)
HCT: 40.9 % (ref 39.0–52.0)
Hemoglobin: 13.5 g/dL (ref 13.0–17.0)
Lymphocytes Relative: 34.8 % (ref 12.0–46.0)
Lymphs Abs: 2.4 10*3/uL (ref 0.7–4.0)
MCHC: 32.9 g/dL (ref 30.0–36.0)
MCV: 85.1 fl (ref 78.0–100.0)
Monocytes Absolute: 0.6 10*3/uL (ref 0.1–1.0)
Monocytes Relative: 8.2 % (ref 3.0–12.0)
Neutro Abs: 3.7 10*3/uL (ref 1.4–7.7)
Neutrophils Relative %: 53.9 % (ref 43.0–77.0)
Platelets: 148 10*3/uL — ABNORMAL LOW (ref 150.0–400.0)
RBC: 4.81 Mil/uL (ref 4.22–5.81)
RDW: 14 % (ref 11.5–15.5)
WBC: 6.8 10*3/uL (ref 4.0–10.5)

## 2016-09-23 LAB — BASIC METABOLIC PANEL
BUN: 12 mg/dL (ref 6–23)
CO2: 28 mEq/L (ref 19–32)
Calcium: 9.4 mg/dL (ref 8.4–10.5)
Chloride: 106 mEq/L (ref 96–112)
Creatinine, Ser: 0.99 mg/dL (ref 0.40–1.50)
GFR: 99.31 mL/min (ref >60.00)
Glucose, Bld: 96 mg/dL (ref 70–99)
Potassium: 4.3 mEq/L (ref 3.5–5.1)
Sodium: 141 mEq/L (ref 135–145)

## 2016-09-23 LAB — URINALYSIS, ROUTINE W REFLEX MICROSCOPIC
Bilirubin Urine: NEGATIVE
Hgb urine dipstick: NEGATIVE
Ketones, ur: NEGATIVE
Leukocytes, UA: NEGATIVE
Nitrite: NEGATIVE
Specific Gravity, Urine: 1.02 (ref 1.000–1.030)
Urine Glucose: NEGATIVE
Urobilinogen, UA: 4 — AB (ref 0.0–1.0)
pH: 7.5 (ref 5.0–8.0)

## 2016-09-23 LAB — HEPATIC FUNCTION PANEL
ALT: 11 U/L (ref 0–53)
AST: 19 U/L (ref 0–37)
Albumin: 4.4 g/dL (ref 3.5–5.2)
Alkaline Phosphatase: 53 U/L (ref 39–117)
Bilirubin, Direct: 0.1 mg/dL (ref 0.0–0.3)
Total Bilirubin: 0.3 mg/dL (ref 0.2–1.2)
Total Protein: 7.1 g/dL (ref 6.0–8.3)

## 2016-09-23 LAB — PSA: PSA: 0.81 ng/mL (ref 0.10–4.00)

## 2016-09-23 LAB — LIPID PANEL
Cholesterol: 173 mg/dL (ref 0–200)
HDL: 57.1 mg/dL (ref >39.00)
LDL Cholesterol: 101 mg/dL — ABNORMAL HIGH (ref 0–99)
NonHDL: 116.3
Total CHOL/HDL Ratio: 3
Triglycerides: 79 mg/dL (ref 0.0–149.0)
VLDL: 15.8 mg/dL (ref 0.0–40.0)

## 2016-09-23 LAB — HEPATITIS C ANTIBODY: HCV Ab: NEGATIVE

## 2016-09-23 LAB — TSH: TSH: 1.61 u[IU]/mL (ref 0.35–4.50)

## 2016-09-23 LAB — HEMOGLOBIN A1C: Hgb A1c MFr Bld: 5.5 % (ref 4.6–6.5)

## 2016-09-24 ENCOUNTER — Encounter: Payer: Self-pay | Admitting: Internal Medicine

## 2016-09-24 ENCOUNTER — Ambulatory Visit (INDEPENDENT_AMBULATORY_CARE_PROVIDER_SITE_OTHER): Payer: 59 | Admitting: Internal Medicine

## 2016-09-24 VITALS — BP 124/64 | HR 81 | Temp 98.4°F | Ht 72.0 in | Wt 147.0 lb

## 2016-09-24 DIAGNOSIS — I10 Essential (primary) hypertension: Secondary | ICD-10-CM

## 2016-09-24 DIAGNOSIS — E785 Hyperlipidemia, unspecified: Secondary | ICD-10-CM

## 2016-09-24 DIAGNOSIS — Z0000 Encounter for general adult medical examination without abnormal findings: Secondary | ICD-10-CM

## 2016-09-24 DIAGNOSIS — R7302 Impaired glucose tolerance (oral): Secondary | ICD-10-CM | POA: Diagnosis not present

## 2016-09-24 MED ORDER — AMLODIPINE BESY-BENAZEPRIL HCL 10-20 MG PO CAPS: 1.0000 | ORAL_CAPSULE | Freq: Every day | ORAL | 3 refills | Status: DC

## 2016-09-24 MED ORDER — PRAVASTATIN SODIUM 40 MG PO TABS: 40.0000 mg | ORAL_TABLET | Freq: Every day | ORAL | 3 refills | Status: DC

## 2016-09-24 NOTE — Assessment & Plan Note (Signed)
stable overall by history and exam, recent data reviewed with pt, and pt to continue medical treatment as before,  to f/u any worsening symptoms or concerns Lab Results  Component Value Date   LDLCALC 101 (H) 09/22/2016

## 2016-09-24 NOTE — Assessment & Plan Note (Signed)
stable overall by history and exam, recent data reviewed with pt, and pt to continue medical treatment as before,  to f/u any worsening symptoms or concerns BP Readings from Last 3 Encounters:  09/24/16 124/64  12/10/15 125/71  09/21/15 120/70

## 2016-09-24 NOTE — Assessment & Plan Note (Signed)

## 2016-09-24 NOTE — Progress Notes (Signed)
Pre visit review using our clinic review tool, if applicable. No additional management support is needed unless otherwise documented below in the visit note. 

## 2016-09-24 NOTE — Assessment & Plan Note (Signed)
stable overall by history and exam, recent data reviewed with pt, and pt to continue medical treatment as before,  to f/u any worsening symptoms or concerns Lab Results  Component Value Date   HGBA1C 5.5 09/22/2016

## 2016-09-24 NOTE — Patient Instructions (Signed)
Please continue all other medications as before, and refills have been done if requested.  Please have the pharmacy call with any other refills you may need.  Please continue your efforts at being more active, low cholesterol diet, and weight control.  You are otherwise up to date with prevention measures today.  Please keep your appointments with your specialists as you may have planned  Please return in 1 year for your yearly visit, or sooner if needed, with Lab testing done 3-5 days before  

## 2016-09-24 NOTE — Progress Notes (Signed)
Subjective:    Patient ID: Nicolas Allen, male    DOB: 07-01-1955, 61 y.o.   MRN: [id number]  HPI  Here for wellness and f/u;  Overall doing ok;  Pt denies Chest pain, worsening SOB, DOE, wheezing, orthopnea, PND, worsening LE edema, palpitations, dizziness or syncope.  Pt denies neurological change such as new headache, facial or extremity weakness.  Pt denies polydipsia, polyuria, or low sugar symptoms. Pt states overall good compliance with treatment and medications, good tolerability, and has been trying to follow appropriate diet.  Pt denies worsening depressive symptoms, suicidal ideation or panic. No fever, night sweats, wt loss, loss of appetite, or other constitutional symptoms.  Pt states good ability with ADL's, has low fall risk, home safety reviewed and adequate, no other significant changes in hearing or vision, and only occasionally active with exercise. No other new history.  Cont;s to work for [employer], plans to retire at 61 yo, and covered by wife health insurance Wt Readings from Last 3 Encounters:  09/24/16 147 lb (66.7 kg)  12/10/15 148 lb (67.1 kg)  11/26/15 148 lb (67.1 kg)   Past Medical History:  Diagnosis Date  . Anemia 06/24/2012  . ANXIETY 02/03/2007  . Cervical disc disease 02/12/2012   S/p surgury jan 2013  . Erectile dysfunction 02/11/2011  . GERD (gastroesophageal reflux disease)   . GLUCOSE INTOLERANCE 01/04/2008  . HYPERLIPIDEMIA 02/03/2007  . HYPERTENSION 02/03/2007  . IBS (irritable bowel syndrome)   . Impaired glucose tolerance 02/11/2011  . Smoker 08/22/2014  . Substance abuse 2001   sober for 16 yrs   Past Surgical History:  Procedure Laterality Date  . COLONOSCOPY  2007  . neck fusion  2012   C4  . NO PAST SURGERIES      reports that he has been smoking Cigarettes.  He has a 45.00 pack-year smoking history. His smokeless tobacco use includes Chew. He reports that he does not drink alcohol or use drugs. family history includes Cancer in his  cousin and mother; Hypertension in his other; Stroke in his other. No Known Allergies Current Outpatient Prescriptions on File Prior to Visit  Medication Sig Dispense Refill  . aspirin 81 MG tablet Take 81 mg by mouth daily.    Marland Kitchen b complex vitamins tablet Take 1 tablet by mouth daily.    . fluocinonide-emollient (LIDEX-E) 0.05 % cream Apply topically 2 (two) times daily. 30 g 1  . Garlic 6314 MG CAPS Take by mouth.    Marland Kitchen ketoconazole (NIZORAL) 2 % cream Apply topically daily. 15 g 0  . Omega-3 Fatty Acids (FISH OIL PO) Take by mouth.    . vardenafil (LEVITRA) 20 MG tablet Take 1 tablet (20 mg total) by mouth as needed for erectile dysfunction. 10 tablet 11   No current facility-administered medications on file prior to visit.    Review of Systems Constitutional: Negative for other unusual diaphoresis, sweats, appetite or weight changes HENT: Negative for other worsening hearing loss, ear pain, facial swelling, mouth sores or neck stiffness.   Eyes: Negative for other worsening pain, redness or other visual disturbance.  Respiratory: Negative for other stridor or swelling Cardiovascular: Negative for other palpitations or other chest pain  Gastrointestinal: Negative for worsening diarrhea or loose stools, blood in stool, distention or other pain Genitourinary: Negative for hematuria, flank pain or other change in urine volume.  Musculoskeletal: Negative for myalgias or other joint swelling.  Skin: Negative for other color change, or other wound or worsening  drainage.  Neurological: Negative for other syncope or numbness. Hematological: Negative for other adenopathy or swelling Psychiatric/Behavioral: Negative for hallucinations, other worsening agitation, SI, self-injury, or new decreased concentration All other system neg per pt    Objective:   Physical Exam BP 124/64   Pulse 81   Temp 98.4 F (36.9 C) (Oral)   Ht 6' (1.829 m)   Wt 147 lb (66.7 kg)   SpO2 98%   BMI 19.94 kg/m    VS noted,  Constitutional: Pt is oriented to person, place, and time. Appears well-developed and well-nourished, in no significant distress and comfortable Head: Normocephalic and atraumatic  Eyes: Conjunctivae and EOM are normal. Pupils are equal, round, and reactive to light Right Ear: External ear normal without discharge Left Ear: External ear normal without discharge Nose: Nose without discharge or deformity Mouth/Throat: Oropharynx is without other ulcerations and moist  Neck: Normal range of motion. Neck supple. No JVD present. No tracheal deviation present or significant neck LA or mass Cardiovascular: Normal rate, regular rhythm, normal heart sounds and intact distal pulses.   Pulmonary/Chest: WOB normal and breath sounds without rales or wheezing  Abdominal: Soft. Bowel sounds are normal. NT. No HSM  Musculoskeletal: Normal range of motion. Exhibits no edema Lymphadenopathy: Has no other cervical adenopathy.  Neurological: Pt is alert and oriented to person, place, and time. Pt has normal reflexes. No cranial nerve deficit. Motor grossly intact, Gait intact Skin: Skin is warm and dry. No rash noted or new ulcerations Psychiatric:  Has normal mood and affect. Behavior is normal without agitation No other exam findings    Assessment & Plan:

## 2017-09-21 ENCOUNTER — Other Ambulatory Visit (INDEPENDENT_AMBULATORY_CARE_PROVIDER_SITE_OTHER): Payer: 59

## 2017-09-21 DIAGNOSIS — R7302 Impaired glucose tolerance (oral): Secondary | ICD-10-CM

## 2017-09-21 DIAGNOSIS — Z0000 Encounter for general adult medical examination without abnormal findings: Secondary | ICD-10-CM

## 2017-09-21 LAB — CBC WITH DIFFERENTIAL/PLATELET
Basophils Absolute: 0.2 10*3/uL — ABNORMAL HIGH (ref 0.0–0.1)
Basophils Relative: 2.3 % (ref 0.0–3.0)
Eosinophils Absolute: 0.3 10*3/uL (ref 0.0–0.7)
Eosinophils Relative: 4.4 % (ref 0.0–5.0)
HCT: 40.1 % (ref 39.0–52.0)
Hemoglobin: 13.5 g/dL (ref 13.0–17.0)
Lymphocytes Relative: 43.2 % (ref 12.0–46.0)
Lymphs Abs: 2.9 10*3/uL (ref 0.7–4.0)
MCHC: 33.5 g/dL (ref 30.0–36.0)
MCV: 85.3 fl (ref 78.0–100.0)
Monocytes Absolute: 0.6 10*3/uL (ref 0.1–1.0)
Monocytes Relative: 9.2 % (ref 3.0–12.0)
Neutro Abs: 2.7 10*3/uL (ref 1.4–7.7)
Neutrophils Relative %: 40.9 % — ABNORMAL LOW (ref 43.0–77.0)
Platelets: 150 10*3/uL (ref 150.0–400.0)
RBC: 4.7 Mil/uL (ref 4.22–5.81)
RDW: 14.4 % (ref 11.5–15.5)
WBC: 6.7 10*3/uL (ref 4.0–10.5)

## 2017-09-21 LAB — HEMOGLOBIN A1C: Hgb A1c MFr Bld: 5.6 % (ref 4.6–6.5)

## 2017-09-22 LAB — URINALYSIS, ROUTINE W REFLEX MICROSCOPIC
Bilirubin Urine: NEGATIVE
Hgb urine dipstick: NEGATIVE
Leukocytes, UA: NEGATIVE
Nitrite: NEGATIVE
RBC / HPF: NONE SEEN (ref 0–?)
Specific Gravity, Urine: 1.02 (ref 1.000–1.030)
Total Protein, Urine: NEGATIVE
Urine Glucose: NEGATIVE
Urobilinogen, UA: >=8 — AB (ref 0.0–1.0)
pH: 7.5 (ref 5.0–8.0)

## 2017-09-22 LAB — BASIC METABOLIC PANEL
BUN: 11 mg/dL (ref 6–23)
CO2: 30 mEq/L (ref 19–32)
Calcium: 8.8 mg/dL (ref 8.4–10.5)
Chloride: 105 mEq/L (ref 96–112)
Creatinine, Ser: 0.89 mg/dL (ref 0.40–1.50)
GFR: 111.91 mL/min (ref >60.00)
Glucose, Bld: 79 mg/dL (ref 70–99)
Potassium: 4.3 mEq/L (ref 3.5–5.1)
Sodium: 140 mEq/L (ref 135–145)

## 2017-09-22 LAB — LIPID PANEL
Cholesterol: 155 mg/dL (ref 0–200)
HDL: 56.5 mg/dL (ref >39.00)
LDL Cholesterol: 79 mg/dL (ref 0–99)
NonHDL: 98.18
Total CHOL/HDL Ratio: 3
Triglycerides: 96 mg/dL (ref 0.0–149.0)
VLDL: 19.2 mg/dL (ref 0.0–40.0)

## 2017-09-22 LAB — HEPATIC FUNCTION PANEL
ALT: 11 U/L (ref 0–53)
AST: 23 U/L (ref 0–37)
Albumin: 4.2 g/dL (ref 3.5–5.2)
Alkaline Phosphatase: 49 U/L (ref 39–117)
Bilirubin, Direct: 0.1 mg/dL (ref 0.0–0.3)
Total Bilirubin: 0.4 mg/dL (ref 0.2–1.2)
Total Protein: 6.7 g/dL (ref 6.0–8.3)

## 2017-09-22 LAB — TSH: TSH: 1.32 u[IU]/mL (ref 0.35–4.50)

## 2017-09-22 LAB — PSA: PSA: 0.83 ng/mL (ref 0.10–4.00)

## 2017-09-22 LAB — HIV ANTIBODY (ROUTINE TESTING W REFLEX): HIV 1&2 Ab, 4th Generation: NONREACTIVE

## 2017-09-25 ENCOUNTER — Ambulatory Visit (INDEPENDENT_AMBULATORY_CARE_PROVIDER_SITE_OTHER): Payer: 59 | Admitting: Internal Medicine

## 2017-09-25 ENCOUNTER — Encounter: Payer: Self-pay | Admitting: Internal Medicine

## 2017-09-25 VITALS — BP 118/74 | HR 87 | Temp 98.5°F | Ht 72.0 in | Wt 146.0 lb

## 2017-09-25 DIAGNOSIS — R7302 Impaired glucose tolerance (oral): Secondary | ICD-10-CM | POA: Diagnosis not present

## 2017-09-25 DIAGNOSIS — Z0000 Encounter for general adult medical examination without abnormal findings: Secondary | ICD-10-CM | POA: Diagnosis not present

## 2017-09-25 DIAGNOSIS — I10 Essential (primary) hypertension: Secondary | ICD-10-CM | POA: Diagnosis not present

## 2017-09-25 MED ORDER — ZOSTER VAC RECOMB ADJUVANTED 50 MCG/0.5ML IM SUSR: 0.5000 mL | Freq: Once | INTRAMUSCULAR | 1 refills | Status: AC

## 2017-09-25 MED ORDER — AMLODIPINE BESY-BENAZEPRIL HCL 10-20 MG PO CAPS: 1.0000 | ORAL_CAPSULE | Freq: Every day | ORAL | 3 refills | Status: DC

## 2017-09-25 MED ORDER — PRAVASTATIN SODIUM 40 MG PO TABS: 40.0000 mg | ORAL_TABLET | Freq: Every day | ORAL | 3 refills | Status: DC

## 2017-09-25 MED ORDER — VARDENAFIL HCL 20 MG PO TABS: 20.0000 mg | ORAL_TABLET | ORAL | 11 refills | Status: DC | PRN

## 2017-09-25 NOTE — Progress Notes (Signed)
Subjective:    Patient ID: Nicolas Allen, male    DOB: 23-Nov-1955, 62 y.o.   MRN: [id number]  HPI  Here for wellness and f/u;  Overall doing ok;  Pt denies Chest pain, worsening SOB, DOE, wheezing, orthopnea, PND, worsening LE edema, palpitations, dizziness or syncope.  Pt denies neurological change such as new headache, facial or extremity weakness.  Pt denies polydipsia, polyuria, or low sugar symptoms. Pt states overall good compliance with treatment and medications, good tolerability, and has been trying to follow appropriate diet.  Pt denies worsening depressive symptoms, suicidal ideation or panic. No fever, night sweats, wt loss, loss of appetite, or other constitutional symptoms.  Pt states good ability with ADL's, has low fall risk, home safety reviewed and adequate, no other significant changes in hearing or vision, and not active with exercise.  Is very happy he has not had any ETOH since May 13, 2000, but admits he is still trying to quit smoking, not yet ready No new complaints or interval hx Past Medical History:  Diagnosis Date  . Anemia 06/24/2012  . ANXIETY 02/03/2007  . Cervical disc disease 02/12/2012   S/p surgury jan 2013  . Erectile dysfunction 02/11/2011  . GERD (gastroesophageal reflux disease)   . GLUCOSE INTOLERANCE 01/04/2008  . HYPERLIPIDEMIA 02/03/2007  . HYPERTENSION 02/03/2007  . IBS (irritable bowel syndrome)   . Impaired glucose tolerance 02/11/2011  . Smoker 08/22/2014  . Substance abuse ([place]) 2001   sober for 16 yrs   Past Surgical History:  Procedure Laterality Date  . COLONOSCOPY  2007  . neck fusion  2012   C4  . NO PAST SURGERIES      reports that he has been smoking cigarettes.  He has a 45.00 pack-year smoking history. His smokeless tobacco use includes chew. He reports that he does not drink alcohol or use drugs. family history includes Cancer in his cousin and mother; Hypertension in his other; Stroke in his other. No Known Allergies Current  Outpatient Medications on File Prior to Visit  Medication Sig Dispense Refill  . aspirin 81 MG tablet Take 81 mg by mouth daily.    Marland Kitchen b complex vitamins tablet Take 1 tablet by mouth daily.    . fluocinonide-emollient (LIDEX-E) 0.05 % cream Apply topically 2 (two) times daily. 30 g 1  . Garlic 7915 MG CAPS Take by mouth.    Marland Kitchen ketoconazole (NIZORAL) 2 % cream Apply topically daily. 15 g 0  . Omega-3 Fatty Acids (FISH OIL PO) Take by mouth.     No current facility-administered medications on file prior to visit.    Review of Systems Constitutional: Negative for other unusual diaphoresis, sweats, appetite or weight changes HENT: Negative for other worsening hearing loss, ear pain, facial swelling, mouth sores or neck stiffness.   Eyes: Negative for other worsening pain, redness or other visual disturbance.  Respiratory: Negative for other stridor or swelling Cardiovascular: Negative for other palpitations or other chest pain  Gastrointestinal: Negative for worsening diarrhea or loose stools, blood in stool, distention or other pain Genitourinary: Negative for hematuria, flank pain or other change in urine volume.  Musculoskeletal: Negative for myalgias or other joint swelling.  Skin: Negative for other color change, or other wound or worsening drainage.  Neurological: Negative for other syncope or numbness. Hematological: Negative for other adenopathy or swelling Psychiatric/Behavioral: Negative for hallucinations, other worsening agitation, SI, self-injury, or new decreased concentration All other system neg per pt    Objective:  Physical Exam BP 118/74   Pulse 87   Temp 98.5 F (36.9 C) (Oral)   Ht 6' (1.829 m)   Wt 146 lb (66.2 kg)   SpO2 97%   BMI 19.80 kg/m  VS noted,  Constitutional: Pt is oriented to person, place, and time. Appears well-developed and well-nourished, in no significant distress and comfortable Head: Normocephalic and atraumatic  Eyes: Conjunctivae and EOM  are normal. Pupils are equal, round, and reactive to light Right Ear: External ear normal without discharge Left Ear: External ear normal without discharge Nose: Nose without discharge or deformity Mouth/Throat: Oropharynx is without other ulcerations and moist  Neck: Normal range of motion. Neck supple. No JVD present. No tracheal deviation present or significant neck LA or mass Cardiovascular: Normal rate, regular rhythm, normal heart sounds and intact distal pulses.   Pulmonary/Chest: WOB normal and breath sounds without rales or wheezing  Abdominal: Soft. Bowel sounds are normal. NT. No HSM  Musculoskeletal: Normal range of motion. Exhibits no edema Lymphadenopathy: Has no other cervical adenopathy.  Neurological: Pt is alert and oriented to person, place, and time. Pt has normal reflexes. No cranial nerve deficit. Motor grossly intact, Gait intact Skin: Skin is warm and dry. No rash noted or new ulcerations Psychiatric:  Has normal mood and affect. Behavior is normal without agitation No other exam findings Lab Results  Component Value Date   WBC 6.7 09/21/2017   HGB 13.5 09/21/2017   HCT 40.1 09/21/2017   PLT 150.0 09/21/2017   GLUCOSE 79 09/21/2017   CHOL 155 09/21/2017   TRIG 96.0 09/21/2017   HDL 56.50 09/21/2017   LDLCALC 79 09/21/2017   ALT 11 09/21/2017   AST 23 09/21/2017   NA 140 09/21/2017   K 4.3 09/21/2017   CL 105 09/21/2017   CREATININE 0.89 09/21/2017   BUN 11 09/21/2017   CO2 30 09/21/2017   TSH 1.32 09/21/2017   PSA 0.83 09/21/2017   INR 0.98 06/27/2011   HGBA1C 5.6 09/21/2017       Assessment & Plan:

## 2017-09-25 NOTE — Patient Instructions (Signed)
Your Shingles prescription was sent to the pharmacy  Please stop smoking  Please continue all other medications as before, and refills have been done if requested.  Please have the pharmacy call with any other refills you may need.  Please continue your efforts at being more active, low cholesterol diet, and weight control.  You are otherwise up to date with prevention measures today.  Please keep your appointments with your specialists as you may have planned  Please return in 1 year for your yearly visit, or sooner if needed, with Lab testing done 3-5 days before

## 2017-09-26 ENCOUNTER — Encounter: Payer: Self-pay | Admitting: Internal Medicine

## 2017-09-26 NOTE — Assessment & Plan Note (Signed)
Lab Results  Component Value Date   HGBA1C 5.6 09/21/2017  stable overall by history and exam, recent data reviewed with pt, and pt to continue medical treatment as before,  to f/u any worsening symptoms or concerns

## 2017-09-26 NOTE — Assessment & Plan Note (Signed)
stable overall by history and exam, recent data reviewed with pt, and pt to continue medical treatment as before,  to f/u any worsening symptoms or concerns BP Readings from Last 3 Encounters:  09/25/17 118/74  09/24/16 124/64  12/10/15 125/71

## 2017-09-26 NOTE — Assessment & Plan Note (Signed)

## 2018-06-23 ENCOUNTER — Ambulatory Visit: Payer: Self-pay

## 2018-06-23 NOTE — Telephone Encounter (Signed)
Pt c/o numbness to the distal joint to the fingertip that stated today. The numbness disappeared during triage call. The numbness lasted approximately 45 minutes. Pt denies any numbness to either side of the face , arms, legs, Speech is normal. Pt state that he gets occasional dizziness but thinks that it is due to skipping BP medications. Advised pt to call back if he has numbness or tingling to the face, arms legs, hands or feet.  Offered to make an appointment for pt this week with other providers since Dr Jenny Reichmann does not have any appointments available but pt wanted to wait until next week. Appt made for next Wednesday with PCP.   Reason for Disposition . [1] Numbness or tingling on both sides of body AND [2] is a new symptom present < 24 hours  Additional Information . Negative: [1] Numbness (i.e., loss of sensation) of the face, arm / hand, or leg / foot on one side of the body AND [2] sudden onset AND [3] present now    Was tingling for 45 minutes then went away  Answer Assessment - Initial Assessment Questions 1. SYMPTOM: "What is the main symptom you are concerned about?" (e.g., weakness, numbness)     numbness 2. ONSET: "When did this start?" (minutes, hours, days; while sleeping)     Today at 1:00 pm 3. LAST NORMAL: "When was the last time you were normal (no symptoms)?"     Earlier today 4. PATTERN "Does this come and go, or has it been constant since it started?"  "Is it present now?"     Constant-yes 5. CARDIAC SYMPTOMS: "Have you had any of the following symptoms: chest pain, difficulty breathing, palpitations?"     no 6. NEUROLOGIC SYMPTOMS: "Have you had any of the following symptoms: headache, dizziness, vision loss, double vision, changes in speech, unsteady on your feet?"     Occasional dizziness last week thinks is due to skipping doses of BP medication- legs get tired with walking  7. OTHER SYMPTOMS: "Do you have any other symptoms?"     no 8. PREGNANCY: "Is there any  chance you are pregnant?" "When was your last menstrual period?"     n/a  Protocols used: NEUROLOGIC DEFICIT-A-AH

## 2018-06-30 ENCOUNTER — Ambulatory Visit: Payer: 59 | Admitting: Internal Medicine

## 2018-06-30 ENCOUNTER — Encounter: Payer: Self-pay | Admitting: Internal Medicine

## 2018-06-30 VITALS — BP 124/72 | HR 93 | Temp 98.3°F | Ht 72.0 in | Wt 142.0 lb

## 2018-06-30 DIAGNOSIS — Z23 Encounter for immunization: Secondary | ICD-10-CM

## 2018-06-30 DIAGNOSIS — R202 Paresthesia of skin: Secondary | ICD-10-CM | POA: Diagnosis not present

## 2018-06-30 DIAGNOSIS — R7302 Impaired glucose tolerance (oral): Secondary | ICD-10-CM

## 2018-06-30 DIAGNOSIS — I10 Essential (primary) hypertension: Secondary | ICD-10-CM

## 2018-06-30 NOTE — Assessment & Plan Note (Signed)
Very short transient right thumb numbness recently without significant recurrence, doubt TIA, more likely CTS like numbness, exam benign, cont asa but also f/u for any recurrent or worsening symptoms such as pain or weakness as well

## 2018-06-30 NOTE — Assessment & Plan Note (Signed)
stable overall by history and exam, recent data reviewed with pt, and pt to continue medical treatment as before,  to f/u any worsening symptoms or concerns  

## 2018-06-30 NOTE — Patient Instructions (Signed)
You had the flu shot today  Please wear a right wrist splint at night if the thumb numbness returns until the numbness does not seem to return  Please continue all other medications as before, and refills have been done if requested.  Please have the pharmacy call with any other refills you may need.  Please continue your efforts at being more active, low cholesterol diet, and weight control.  You are otherwise up to date with prevention measures today.  Please keep your appointments with your specialists as you may have planned

## 2018-06-30 NOTE — Progress Notes (Signed)
Subjective:    Patient ID: Nicolas Allen, male    DOB: March 11, 1956, 63 y.o.   MRN: [id number]  HPI  Here with right thumb numbness at work one day last wk, overall mild but kept on going about 45 min, so called for appt.  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  No specific trauma.  Whole episoded lasted about 1 hr, and did have a slight 1-2 min episode the next day but resolved and hsa not recurred.  Pt denies new neurological symptoms such as new headache, or facial or extremity weakness or other numbness.  Pt denies chest pain, increased sob or doe, wheezing, orthopnea, PND, increased LE swelling, palpitations, dizziness or syncope.   Pt denies polydipsia, polyuria, or low sugar symptoms such as weakness or confusion improved with po intake.  Pt states overall good compliance with meds, trying to follow lower cholesterol, diabetic diet, wt overall stable.  Taking the asa 81 mg and statin daily Past Medical History:  Diagnosis Date  . Anemia 06/24/2012  . ANXIETY 02/03/2007  . Cervical disc disease 02/12/2012   S/p surgury jan 2013  . Erectile dysfunction 02/11/2011  . GERD (gastroesophageal reflux disease)   . GLUCOSE INTOLERANCE 01/04/2008  . HYPERLIPIDEMIA 02/03/2007  . HYPERTENSION 02/03/2007  . IBS (irritable bowel syndrome)   . Impaired glucose tolerance 02/11/2011  . Smoker 08/22/2014  . Substance abuse ([place]) 2001   sober for 16 yrs   Past Surgical History:  Procedure Laterality Date  . COLONOSCOPY  2007  . neck fusion  2012   C4  . NO PAST SURGERIES      reports that he has been smoking cigarettes. He has a 45.00 pack-year smoking history. His smokeless tobacco use includes chew. He reports that he does not drink alcohol or use drugs. family history includes Cancer in his cousin and mother; Hypertension in an other family member; Stroke in an other family member. No Known Allergies Current Outpatient Medications on File Prior to Visit  Medication Sig Dispense Refill  .  amLODipine-benazepril (LOTREL) 10-20 MG capsule Take 1 capsule by mouth daily. 90 capsule 3  . aspirin 81 MG tablet Take 81 mg by mouth daily.    Marland Kitchen b complex vitamins tablet Take 1 tablet by mouth daily.    . fluocinonide-emollient (LIDEX-E) 0.05 % cream Apply topically 2 (two) times daily. 30 g 1  . Garlic 1914 MG CAPS Take by mouth.    Marland Kitchen ketoconazole (NIZORAL) 2 % cream Apply topically daily. 15 g 0  . Omega-3 Fatty Acids (FISH OIL PO) Take by mouth.    . pravastatin (PRAVACHOL) 40 MG tablet Take 1 tablet (40 mg total) by mouth daily. 90 tablet 3  . vardenafil (LEVITRA) 20 MG tablet Take 1 tablet (20 mg total) by mouth as needed for erectile dysfunction. 10 tablet 11   No current facility-administered medications on file prior to visit.    Review of Systems  Constitutional: Negative for other unusual diaphoresis or sweats HENT: Negative for ear discharge or swelling Eyes: Negative for other worsening visual disturbances Respiratory: Negative for stridor or other swelling  Gastrointestinal: Negative for worsening distension or other blood Genitourinary: Negative for retention or other urinary change Musculoskeletal: Negative for other MSK pain or swelling Skin: Negative for color change or other new lesions Neurological: Negative for worsening tremors and other numbness  Psychiatric/Behavioral: Negative for worsening agitation or other fatigue All other system neg per pt    Objective:  Physical Exam BP 124/72   Pulse 93   Temp 98.3 F (36.8 C) (Oral)   Ht 6' (1.829 m)   Wt 142 lb (64.4 kg)   SpO2 97%   BMI 19.26 kg/m  VS noted,  Constitutional: Pt appears in NAD HENT: Head: NCAT.  Right Ear: External ear normal.  Left Ear: External ear normal.  Eyes: . Pupils are equal, round, and reactive to light. Conjunctivae and EOM are normal Nose: without d/c or deformity Neck: Neck supple. Gross normal ROM Cardiovascular: Normal rate and regular rhythm.   Pulmonary/Chest: Effort  normal and breath sounds without rales or wheezing.  Abd:  Soft, NT, ND, + BS, no organomegaly Neurological: Pt is alert. At baseline orientation, motor grossly intact, sens intact throughtout to LT Skin: Skin is warm. No rashes, other new lesions, no LE edema Psychiatric: Pt behavior is normal without agitation  No other exam findings Lab Results  Component Value Date   WBC 6.7 09/21/2017   HGB 13.5 09/21/2017   HCT 40.1 09/21/2017   PLT 150.0 09/21/2017   GLUCOSE 79 09/21/2017   CHOL 155 09/21/2017   TRIG 96.0 09/21/2017   HDL 56.50 09/21/2017   LDLCALC 79 09/21/2017   ALT 11 09/21/2017   AST 23 09/21/2017   NA 140 09/21/2017   K 4.3 09/21/2017   CL 105 09/21/2017   CREATININE 0.89 09/21/2017   BUN 11 09/21/2017   CO2 30 09/21/2017   TSH 1.32 09/21/2017   PSA 0.83 09/21/2017   INR 0.98 06/27/2011   HGBA1C 5.6 09/21/2017       Assessment & Plan:

## 2018-09-28 ENCOUNTER — Encounter: Payer: 59 | Admitting: Internal Medicine

## 2018-10-12 ENCOUNTER — Other Ambulatory Visit: Payer: Self-pay | Admitting: Internal Medicine

## 2018-12-28 ENCOUNTER — Other Ambulatory Visit: Payer: Self-pay | Admitting: Internal Medicine

## 2019-02-18 ENCOUNTER — Telehealth: Payer: Self-pay

## 2019-02-18 DIAGNOSIS — R7302 Impaired glucose tolerance (oral): Secondary | ICD-10-CM

## 2019-02-18 DIAGNOSIS — Z0000 Encounter for general adult medical examination without abnormal findings: Secondary | ICD-10-CM

## 2019-02-18 NOTE — Telephone Encounter (Signed)
Copied from [place] [id number]. Topic: General - Other >> Feb 18, 2019 11:45 AM Leward Quan A wrote: Reason for CRM: Patient called to say that he would like to come in the day before and have his blood work before his appointment on 02/23/2019. Asking for Dr Jenny Reichmann to place orders in his chart please.

## 2019-02-22 ENCOUNTER — Other Ambulatory Visit (INDEPENDENT_AMBULATORY_CARE_PROVIDER_SITE_OTHER): Payer: 59

## 2019-02-22 DIAGNOSIS — R7302 Impaired glucose tolerance (oral): Secondary | ICD-10-CM

## 2019-02-22 DIAGNOSIS — Z0000 Encounter for general adult medical examination without abnormal findings: Secondary | ICD-10-CM

## 2019-02-22 LAB — LIPID PANEL
Cholesterol: 160 mg/dL (ref 0–200)
HDL: 56.8 mg/dL (ref >39.00)
LDL Cholesterol: 91 mg/dL (ref 0–99)
NonHDL: 103.69
Total CHOL/HDL Ratio: 3
Triglycerides: 62 mg/dL (ref 0.0–149.0)
VLDL: 12.4 mg/dL (ref 0.0–40.0)

## 2019-02-22 LAB — URINALYSIS, ROUTINE W REFLEX MICROSCOPIC
Hgb urine dipstick: NEGATIVE
Ketones, ur: NEGATIVE
Leukocytes,Ua: NEGATIVE
Nitrite: NEGATIVE
RBC / HPF: NONE SEEN (ref 0–?)
Specific Gravity, Urine: >=1.03 — AB (ref 1.000–1.030)
Total Protein, Urine: NEGATIVE
Urine Glucose: NEGATIVE
Urobilinogen, UA: 0.2 (ref 0.0–1.0)
WBC, UA: NONE SEEN (ref 0–?)
pH: 5.5 (ref 5.0–8.0)

## 2019-02-22 LAB — CBC WITH DIFFERENTIAL/PLATELET
Basophils Absolute: 0.1 10*3/uL (ref 0.0–0.1)
Basophils Relative: 1.9 % (ref 0.0–3.0)
Eosinophils Absolute: 0.3 10*3/uL (ref 0.0–0.7)
Eosinophils Relative: 3.9 % (ref 0.0–5.0)
HCT: 41.1 % (ref 39.0–52.0)
Hemoglobin: 13.4 g/dL (ref 13.0–17.0)
Lymphocytes Relative: 29.1 % (ref 12.0–46.0)
Lymphs Abs: 1.9 10*3/uL (ref 0.7–4.0)
MCHC: 32.6 g/dL (ref 30.0–36.0)
MCV: 85.8 fl (ref 78.0–100.0)
Monocytes Absolute: 0.6 10*3/uL (ref 0.1–1.0)
Monocytes Relative: 8.8 % (ref 3.0–12.0)
Neutro Abs: 3.7 10*3/uL (ref 1.4–7.7)
Neutrophils Relative %: 56.3 % (ref 43.0–77.0)
Platelets: 156 10*3/uL (ref 150.0–400.0)
RBC: 4.79 Mil/uL (ref 4.22–5.81)
RDW: 14.4 % (ref 11.5–15.5)
WBC: 6.6 10*3/uL (ref 4.0–10.5)

## 2019-02-22 LAB — BASIC METABOLIC PANEL
BUN: 11 mg/dL (ref 6–23)
CO2: 26 mEq/L (ref 19–32)
Calcium: 8.9 mg/dL (ref 8.4–10.5)
Chloride: 104 mEq/L (ref 96–112)
Creatinine, Ser: 0.83 mg/dL (ref 0.40–1.50)
GFR: 113.59 mL/min (ref >60.00)
Glucose, Bld: 118 mg/dL — ABNORMAL HIGH (ref 70–99)
Potassium: 3.7 mEq/L (ref 3.5–5.1)
Sodium: 139 mEq/L (ref 135–145)

## 2019-02-22 LAB — HEPATIC FUNCTION PANEL
ALT: 9 U/L (ref 0–53)
AST: 15 U/L (ref 0–37)
Albumin: 3.9 g/dL (ref 3.5–5.2)
Alkaline Phosphatase: 48 U/L (ref 39–117)
Bilirubin, Direct: 0.1 mg/dL (ref 0.0–0.3)
Total Bilirubin: 0.5 mg/dL (ref 0.2–1.2)
Total Protein: 6.5 g/dL (ref 6.0–8.3)

## 2019-02-22 LAB — HEMOGLOBIN A1C: Hgb A1c MFr Bld: 5.6 % (ref 4.6–6.5)

## 2019-02-22 LAB — PSA: PSA: 0.82 ng/mL (ref 0.10–4.00)

## 2019-02-22 LAB — TSH: TSH: 1.51 u[IU]/mL (ref 0.35–4.50)

## 2019-02-23 ENCOUNTER — Encounter: Payer: Self-pay | Admitting: Internal Medicine

## 2019-02-23 ENCOUNTER — Other Ambulatory Visit: Payer: Self-pay

## 2019-02-23 ENCOUNTER — Ambulatory Visit (INDEPENDENT_AMBULATORY_CARE_PROVIDER_SITE_OTHER): Payer: 59 | Admitting: Internal Medicine

## 2019-02-23 VITALS — BP 114/72 | HR 85 | Temp 98.1°F | Ht 72.0 in | Wt 139.0 lb

## 2019-02-23 DIAGNOSIS — R7302 Impaired glucose tolerance (oral): Secondary | ICD-10-CM | POA: Diagnosis not present

## 2019-02-23 DIAGNOSIS — Z23 Encounter for immunization: Secondary | ICD-10-CM

## 2019-02-23 DIAGNOSIS — Z0000 Encounter for general adult medical examination without abnormal findings: Secondary | ICD-10-CM | POA: Diagnosis not present

## 2019-02-23 DIAGNOSIS — E538 Deficiency of other specified B group vitamins: Secondary | ICD-10-CM

## 2019-02-23 DIAGNOSIS — E611 Iron deficiency: Secondary | ICD-10-CM

## 2019-02-23 DIAGNOSIS — F17200 Nicotine dependence, unspecified, uncomplicated: Secondary | ICD-10-CM

## 2019-02-23 DIAGNOSIS — E559 Vitamin D deficiency, unspecified: Secondary | ICD-10-CM

## 2019-02-23 NOTE — Patient Instructions (Addendum)
You had the Tdap tetanus shot today  Please quit smoking  Please continue all other medications as before, and refills have been done if requested.  Please have the pharmacy call with any other refills you may need.  Please continue your efforts at being more active, low cholesterol diet, and weight control.  You are otherwise up to date with prevention measures today.  Please keep your appointments with your specialists as you may have planned  Please return in 1 year for your yearly visit, or sooner if needed, with Lab testing done 3-5 days before

## 2019-02-23 NOTE — Progress Notes (Signed)
Subjective:    Patient ID: Nicolas Allen, male    DOB: Dec 11, 1955, 63 y.o.   MRN: [id number]  HPI  Here for wellness and f/u;  Overall doing ok;  Pt denies Chest pain, worsening SOB, DOE, wheezing, orthopnea, PND, worsening LE edema, palpitations, dizziness or syncope.  Pt denies neurological change such as new headache, facial or extremity weakness.  Pt denies polydipsia, polyuria, or low sugar symptoms. Pt states overall good compliance with treatment and medications, good tolerability, and has been trying to follow appropriate diet.  Pt denies worsening depressive symptoms, suicidal ideation or panic. No fever, night sweats, wt loss, loss of appetite, or other constitutional symptoms.  Pt states good ability with ADL's, has low fall risk, home safety reviewed and adequate, no other significant changes in hearing or vision, and only occasionally active with exercise. Has not been able to work mar 27 due to pandemic, now just turned 64yo and does not plan to return. Stil smoking 1-1/5 pack per day.  Not ready to quit, though has been 19 years sober.   Wt Readings from Last 3 Encounters:  02/23/19 139 lb (63 kg)  06/30/18 142 lb (64.4 kg)  09/25/17 146 lb (66.2 kg)   BP Readings from Last 3 Encounters:  02/23/19 114/72  06/30/18 124/72  09/25/17 118/74   Past Medical History:  Diagnosis Date  . Anemia 06/24/2012  . ANXIETY 02/03/2007  . Cervical disc disease 02/12/2012   S/p surgury jan 2013  . Erectile dysfunction 02/11/2011  . GERD (gastroesophageal reflux disease)   . GLUCOSE INTOLERANCE 01/04/2008  . HYPERLIPIDEMIA 02/03/2007  . HYPERTENSION 02/03/2007  . IBS (irritable bowel syndrome)   . Impaired glucose tolerance 02/11/2011  . Smoker 08/22/2014  . Substance abuse ([place]) 2001   sober for 16 yrs   Past Surgical History:  Procedure Laterality Date  . COLONOSCOPY  2007  . neck fusion  2012   C4  . NO PAST SURGERIES      reports that he has been smoking cigarettes. He has a 45.00  pack-year smoking history. His smokeless tobacco use includes chew. He reports that he does not drink alcohol or use drugs. family history includes Cancer in his cousin and mother; Hypertension in an other family member; Stroke in an other family member. No Known Allergies Current Outpatient Medications on File Prior to Visit  Medication Sig Dispense Refill  . amLODipine-benazepril (LOTREL) 10-20 MG capsule Take 1 capsule by mouth once daily 30 capsule 5  . aspirin 81 MG tablet Take 81 mg by mouth daily.    Marland Kitchen b complex vitamins tablet Take 1 tablet by mouth daily.    . fluocinonide-emollient (LIDEX-E) 0.05 % cream Apply topically 2 (two) times daily. 30 g 1  . Garlic 9892 MG CAPS Take by mouth.    Marland Kitchen ketoconazole (NIZORAL) 2 % cream Apply topically daily. 15 g 0  . Omega-3 Fatty Acids (FISH OIL PO) Take by mouth.    . pravastatin (PRAVACHOL) 40 MG tablet Take 1 tablet by mouth once daily 30 tablet 5  . vardenafil (LEVITRA) 20 MG tablet TAKE 1 TABLET BY MOUTH AS NEEDED FOR  ERECTILE  DYSFUNCTION 10 tablet 0   No current facility-administered medications on file prior to visit.    Review of Systems Constitutional: Negative for other unusual diaphoresis, sweats, appetite or weight changes HENT: Negative for other worsening hearing loss, ear pain, facial swelling, mouth sores or neck stiffness.   Eyes: Negative for other worsening pain,  redness or other visual disturbance.  Respiratory: Negative for other stridor or swelling Cardiovascular: Negative for other palpitations or other chest pain  Gastrointestinal: Negative for worsening diarrhea or loose stools, blood in stool, distention or other pain Genitourinary: Negative for hematuria, flank pain or other change in urine volume.  Musculoskeletal: Negative for myalgias or other joint swelling.  Skin: Negative for other color change, or other wound or worsening drainage.  Neurological: Negative for other syncope or numbness. Hematological:  Negative for other adenopathy or swelling Psychiatric/Behavioral: Negative for hallucinations, other worsening agitation, SI, self-injury, or new decreased concentration All other system neg per pt    Objective:   Physical Exam BP 114/72   Pulse 85   Temp 98.1 F (36.7 C) (Oral)   Ht 6' (1.829 m)   Wt 139 lb (63 kg)   SpO2 99%   BMI 18.85 kg/m  VS noted,  Constitutional: Pt is oriented to person, place, and time. Appears well-developed and well-nourished, in no significant distress and comfortable Head: Normocephalic and atraumatic  Eyes: Conjunctivae and EOM are normal. Pupils are equal, round, and reactive to light Right Ear: External ear normal without discharge Left Ear: External ear normal without discharge Nose: Nose without discharge or deformity Mouth/Throat: Oropharynx is without other ulcerations and moist  Neck: Normal range of motion. Neck supple. No JVD present. No tracheal deviation present or significant neck LA or mass Cardiovascular: Normal rate, regular rhythm, normal heart sounds and intact distal pulses.   Pulmonary/Chest: WOB normal and breath sounds without rales or wheezing  Abdominal: Soft. Bowel sounds are normal. NT. No HSM  Musculoskeletal: Normal range of motion. Exhibits no edema Lymphadenopathy: Has no other cervical adenopathy.  Neurological: Pt is alert and oriented to person, place, and time. Pt has normal reflexes. No cranial nerve deficit. Motor grossly intact, Gait intact Skin: Skin is warm and dry. No rash noted or new ulcerations Psychiatric:  Has normal mood and affect. Behavior is normal without agitation No other exam findings Lab Results  Component Value Date   WBC 6.6 02/22/2019   HGB 13.4 02/22/2019   HCT 41.1 02/22/2019   PLT 156.0 02/22/2019   GLUCOSE 118 (H) 02/22/2019   CHOL 160 02/22/2019   TRIG 62.0 02/22/2019   HDL 56.80 02/22/2019   LDLCALC 91 02/22/2019   ALT 9 02/22/2019   AST 15 02/22/2019   NA 139 02/22/2019   K  3.7 02/22/2019   CL 104 02/22/2019   CREATININE 0.83 02/22/2019   BUN 11 02/22/2019   CO2 26 02/22/2019   TSH 1.51 02/22/2019   PSA 0.82 02/22/2019   INR 0.98 06/27/2011   HGBA1C 5.6 02/22/2019      Assessment & Plan:

## 2019-02-23 NOTE — Assessment & Plan Note (Signed)
Urged to quit 

## 2019-02-23 NOTE — Assessment & Plan Note (Signed)
For f/u lab next visit

## 2019-02-23 NOTE — Assessment & Plan Note (Signed)
stable overall by history and exam, recent data reviewed with pt, and pt to continue medical treatment as before,  to f/u any worsening symptoms or concerns  

## 2019-02-23 NOTE — Assessment & Plan Note (Signed)

## 2019-05-17 ENCOUNTER — Other Ambulatory Visit: Payer: Self-pay | Admitting: Internal Medicine

## 2019-09-02 ENCOUNTER — Ambulatory Visit: Payer: 59 | Attending: Internal Medicine

## 2019-09-02 DIAGNOSIS — Z23 Encounter for immunization: Secondary | ICD-10-CM

## 2019-09-02 NOTE — Progress Notes (Signed)
   Covid-19 Vaccination Clinic  Name:  Nicolas Allen    MRN: [id number] DOB: 1955/08/05  09/02/2019  [name] was observed post Covid-19 immunization for 15 minutes without incident. He was provided with Vaccine Information Sheet and instruction to access the V-Safe system.   [name] was instructed to call 911 with any severe reactions post vaccine: Marland Kitchen Difficulty breathing  . Swelling of face and throat  . A fast heartbeat  . A bad rash all over body  . Dizziness and weakness   Immunizations Administered    Name Date Dose VIS Date Route   Pfizer COVID-19 Vaccine 09/02/2019  9:59 AM 0.3 mL 05/27/2019 Intramuscular   Manufacturer: [place]   Lot: [id number]   [place]: [id number]

## 2019-09-27 ENCOUNTER — Ambulatory Visit: Payer: 59 | Attending: Internal Medicine

## 2019-09-27 DIAGNOSIS — Z23 Encounter for immunization: Secondary | ICD-10-CM

## 2019-09-27 NOTE — Progress Notes (Signed)
   Covid-19 Vaccination Clinic  Name:  Nicolas Allen    MRN: [id number] DOB: 1956-05-12  09/27/2019  Nicolas Allen was observed post Covid-19 immunization for 15 minutes without incident. He was provided with Vaccine Information Sheet and instruction to access the V-Safe system.   Nicolas Allen was instructed to call 911 with any severe reactions post vaccine: Marland Kitchen Difficulty breathing  . Swelling of face and throat  . A fast heartbeat  . A bad rash all over body  . Dizziness and weakness   Immunizations Administered    Name Date Dose VIS Date Route   Pfizer COVID-19 Vaccine 09/27/2019 10:16 AM 0.3 mL 05/27/2019 Intramuscular   Manufacturer: [place]   Lot: [id number]   [place]: [id number]

## 2019-12-29 ENCOUNTER — Other Ambulatory Visit: Payer: Self-pay | Admitting: Internal Medicine

## 2019-12-29 NOTE — Telephone Encounter (Signed)
Please refill as per office routine med refill policy (all routine meds refilled for 3 mo or monthly per pt preference up to one year from last visit, then month to month grace period for 3 mo, then further med refills will have to be denied)  

## 2020-02-21 ENCOUNTER — Other Ambulatory Visit: Payer: 59

## 2020-02-21 ENCOUNTER — Other Ambulatory Visit: Payer: Self-pay

## 2020-02-21 DIAGNOSIS — R7302 Impaired glucose tolerance (oral): Secondary | ICD-10-CM

## 2020-02-21 DIAGNOSIS — E538 Deficiency of other specified B group vitamins: Secondary | ICD-10-CM

## 2020-02-21 DIAGNOSIS — Z0000 Encounter for general adult medical examination without abnormal findings: Secondary | ICD-10-CM

## 2020-02-21 DIAGNOSIS — E559 Vitamin D deficiency, unspecified: Secondary | ICD-10-CM

## 2020-02-21 NOTE — Addendum Note (Signed)
Addended by: Hazle Quant on: 02/21/2020 11:14 AM   Modules accepted: Orders

## 2020-02-21 NOTE — Addendum Note (Signed)
Addended by: Hazle Quant on: 02/21/2020 11:13 AM   Modules accepted: Orders

## 2020-02-21 NOTE — Addendum Note (Signed)
Addended by: Hazle Quant on: 02/21/2020 11:12 AM   Modules accepted: Orders

## 2020-02-21 NOTE — Addendum Note (Signed)
Addended by: Hazle Quant on: 02/21/2020 11:10 AM   Modules accepted: Orders

## 2020-02-22 LAB — VITAMIN B12: Vitamin B-12: 551 pg/mL (ref 200–1100)

## 2020-02-22 LAB — CBC WITH DIFFERENTIAL/PLATELET
Absolute Monocytes: 428 cells/uL (ref 200–950)
Basophils Absolute: 29 cells/uL (ref 0–200)
Basophils Relative: 0.5 %
Eosinophils Absolute: 467 cells/uL (ref 15–500)
Eosinophils Relative: 8.2 %
HCT: 41.5 % (ref 38.5–50.0)
Hemoglobin: 13.9 g/dL (ref 13.2–17.1)
Lymphs Abs: 2200 cells/uL (ref 850–3900)
MCH: 28.7 pg (ref 27.0–33.0)
MCHC: 33.5 g/dL (ref 32.0–36.0)
MCV: 85.7 fL (ref 80.0–100.0)
MPV: 11.2 fL (ref 7.5–12.5)
Monocytes Relative: 7.5 %
Neutro Abs: 2576 cells/uL (ref 1500–7800)
Neutrophils Relative %: 45.2 %
Platelets: 170 10*3/uL (ref 140–400)
RBC: 4.84 10*6/uL (ref 4.20–5.80)
RDW: 14.2 % (ref 11.0–15.0)
Total Lymphocyte: 38.6 %
WBC: 5.7 10*3/uL (ref 3.8–10.8)

## 2020-02-22 LAB — BASIC METABOLIC PANEL
BUN: 7 mg/dL (ref 7–25)
CO2: 25 mmol/L (ref 20–32)
Calcium: 8.9 mg/dL (ref 8.6–10.3)
Chloride: 107 mmol/L (ref 98–110)
Creat: 0.88 mg/dL (ref 0.70–1.25)
Glucose, Bld: 154 mg/dL — ABNORMAL HIGH (ref 65–99)
Potassium: 3.6 mmol/L (ref 3.5–5.3)
Sodium: 139 mmol/L (ref 135–146)

## 2020-02-22 LAB — VITAMIN D 25 HYDROXY (VIT D DEFICIENCY, FRACTURES): Vit D, 25-Hydroxy: 15 ng/mL — ABNORMAL LOW (ref 30–100)

## 2020-02-22 LAB — TSH: TSH: 2.24 mIU/L (ref 0.40–4.50)

## 2020-02-22 LAB — HEPATIC FUNCTION PANEL
AG Ratio: 1.5 (calc) (ref 1.0–2.5)
ALT: 8 U/L — ABNORMAL LOW (ref 9–46)
AST: 16 U/L (ref 10–35)
Albumin: 3.8 g/dL (ref 3.6–5.1)
Alkaline phosphatase (APISO): 51 U/L (ref 35–144)
Bilirubin, Direct: 0.1 mg/dL (ref 0.0–0.2)
Globulin: 2.5 g/dL (calc) (ref 1.9–3.7)
Indirect Bilirubin: 0.3 mg/dL (calc) (ref 0.2–1.2)
Total Bilirubin: 0.4 mg/dL (ref 0.2–1.2)
Total Protein: 6.3 g/dL (ref 6.1–8.1)

## 2020-02-22 LAB — LIPID PANEL
Cholesterol: 162 mg/dL (ref <200)
HDL: 51 mg/dL (ref >=40)
LDL Cholesterol (Calc): 97 mg/dL (calc)
Non-HDL Cholesterol (Calc): 111 mg/dL (calc) (ref <130)
Total CHOL/HDL Ratio: 3.2 (calc) (ref <5.0)
Triglycerides: 46 mg/dL (ref <150)

## 2020-02-22 LAB — PSA: PSA: 1.1 ng/mL (ref <=4.0)

## 2020-02-22 LAB — HEMOGLOBIN A1C
Hgb A1c MFr Bld: 4.9 % of total Hgb (ref <5.7)
Mean Plasma Glucose: 94 (calc)
eAG (mmol/L): 5.2 (calc)

## 2020-02-22 LAB — TIQ-MISC

## 2020-02-24 ENCOUNTER — Encounter: Payer: Self-pay | Admitting: Internal Medicine

## 2020-02-24 ENCOUNTER — Other Ambulatory Visit: Payer: Self-pay

## 2020-02-24 ENCOUNTER — Ambulatory Visit (INDEPENDENT_AMBULATORY_CARE_PROVIDER_SITE_OTHER): Payer: 59 | Admitting: Internal Medicine

## 2020-02-24 VITALS — BP 130/70 | HR 80 | Temp 97.9°F | Ht 72.0 in | Wt 140.0 lb

## 2020-02-24 DIAGNOSIS — Z0000 Encounter for general adult medical examination without abnormal findings: Secondary | ICD-10-CM

## 2020-02-24 DIAGNOSIS — M79604 Pain in right leg: Secondary | ICD-10-CM

## 2020-02-24 DIAGNOSIS — F17200 Nicotine dependence, unspecified, uncomplicated: Secondary | ICD-10-CM

## 2020-02-24 DIAGNOSIS — Z0001 Encounter for general adult medical examination with abnormal findings: Secondary | ICD-10-CM

## 2020-02-24 DIAGNOSIS — E559 Vitamin D deficiency, unspecified: Secondary | ICD-10-CM

## 2020-02-24 DIAGNOSIS — Z23 Encounter for immunization: Secondary | ICD-10-CM | POA: Diagnosis not present

## 2020-02-24 DIAGNOSIS — R7302 Impaired glucose tolerance (oral): Secondary | ICD-10-CM

## 2020-02-24 DIAGNOSIS — I10 Essential (primary) hypertension: Secondary | ICD-10-CM

## 2020-02-24 DIAGNOSIS — M79605 Pain in left leg: Secondary | ICD-10-CM

## 2020-02-24 MED ORDER — VARDENAFIL HCL 20 MG PO TABS: ORAL_TABLET | ORAL | 5 refills | Status: AC

## 2020-02-24 MED ORDER — VITAMIN D (ERGOCALCIFEROL) 1.25 MG (50000 UNIT) PO CAPS: 50000.0000 [IU] | ORAL_CAPSULE | ORAL | 0 refills | Status: DC

## 2020-02-24 NOTE — Patient Instructions (Addendum)
You had the flu shot today  Please take Vitamin D 50000 units weekly for 12 weeks, then plan to change to OTC Vitamin D3 at 2000 units per day, indefinitely.  Please continue all other medications as before, and refills have been done if requested.  Please have the pharmacy call with any other refills you may need.  Please continue your efforts at being more active, low cholesterol diet, and weight control.  You are otherwise up to date with prevention measures today.  Please keep your appointments with your specialists as you may have planned  Please stop smoking  You will be contacted regarding the referral for: pulmonary for the LDCT lung cancer screening program   You will be contacted regarding the referral for: leg artery circulation testing  Please make an Appointment to return in 6 months, or sooner if needed

## 2020-02-24 NOTE — Progress Notes (Signed)
Subjective:    Patient ID: Nicolas Allen, male    DOB: 1955-10-05, 64 y.o.   MRN: [id number]  HPI  Here for wellness and f/u;  Overall doing ok;  Pt denies Chest pain, worsening SOB, DOE, wheezing, orthopnea, PND, worsening LE edema, palpitations, dizziness or syncope.  Pt denies neurological change such as new headache, facial or extremity weakness.  Pt denies polydipsia, polyuria, or low sugar symptoms. Pt states overall good compliance with treatment and medications, good tolerability, and has been trying to follow appropriate diet.  Pt denies worsening depressive symptoms, suicidal ideation or panic. No fever, night sweats, wt loss, loss of appetite, or other constitutional symptoms.  Pt states good ability with ADL's, has low fall risk, home safety reviewed and adequate, no other significant changes in hearing or vision, and only occasionally active with exercise.  Still smoking, not ready to quit.  Also with bilateral leg pain worse to walking, hard to localize, better to rest.   Past Medical History:  Diagnosis Date  . Anemia 06/24/2012  . ANXIETY 02/03/2007  . Cervical disc disease 02/12/2012   S/p surgury jan 2013  . Erectile dysfunction 02/11/2011  . GERD (gastroesophageal reflux disease)   . GLUCOSE INTOLERANCE 01/04/2008  . HYPERLIPIDEMIA 02/03/2007  . HYPERTENSION 02/03/2007  . IBS (irritable bowel syndrome)   . Impaired glucose tolerance 02/11/2011  . Smoker 08/22/2014  . Substance abuse ([place]) 2001   sober for 16 yrs   Past Surgical History:  Procedure Laterality Date  . COLONOSCOPY  2007  . neck fusion  2012   C4  . NO PAST SURGERIES      reports that he has been smoking cigarettes. He has a 45.00 pack-year smoking history. His smokeless tobacco use includes chew. He reports that he does not drink alcohol and does not use drugs. family history includes Cancer in his cousin and mother; Hypertension in an other family member; Stroke in an other family member. No Known  Allergies Current Outpatient Medications on File Prior to Visit  Medication Sig Dispense Refill  . amLODipine-benazepril (LOTREL) 10-20 MG capsule Take 1 capsule by mouth daily. Annual appt due in Sept must see provider for future refills 30 capsule 2  . aspirin 81 MG tablet Take 81 mg by mouth daily.    Marland Kitchen b complex vitamins tablet Take 1 tablet by mouth daily.    . fluocinonide-emollient (LIDEX-E) 0.05 % cream Apply topically 2 (two) times daily. 30 g 1  . Garlic 7209 MG CAPS Take by mouth.    Marland Kitchen HYDROcodone-acetaminophen (NORCO/VICODIN) 5-325 MG tablet     . ibuprofen (ADVIL) 600 MG tablet Take 600 mg by mouth every 6 (six) hours as needed.    Marland Kitchen ketoconazole (NIZORAL) 2 % cream Apply topically daily. 15 g 0  . Omega-3 Fatty Acids (FISH OIL PO) Take by mouth.    . pravastatin (PRAVACHOL) 40 MG tablet Take 1 tablet (40 mg total) by mouth daily. Annual appt due in Sept must see provider for future refills 30 tablet 2   No current facility-administered medications on file prior to visit.   Review of Systems All otherwise neg per pt    Objective:   Physical Exam BP 130/70 (BP Location: Left Arm, Patient Position: Sitting, Cuff Size: Large)   Pulse 80   Temp 97.9 F (36.6 C) (Oral)   Ht 6' (1.829 m)   Wt 140 lb (63.5 kg)   SpO2 99%   BMI 18.99 kg/m  VS noted,  Constitutional: Pt appears in NAD HENT: Head: NCAT.  Right Ear: External ear normal.  Left Ear: External ear normal.  Eyes: . Pupils are equal, round, and reactive to light. Conjunctivae and EOM are normal Nose: without d/c or deformity Neck: Neck supple. Gross normal ROM Cardiovascular: Normal rate and regular rhythm.   Pulmonary/Chest: Effort normal and breath sounds without rales or wheezing.  Abd:  Soft, NT, ND, + BS, no organomegaly Neurological: Pt is alert. At baseline orientation, motor grossly intact Skin: Skin is warm. No rashes, other new lesions, no LE edema, dorsalis pedis trace bilat Psychiatric: Pt behavior  is normal without agitation  All otherwise neg per pt Lab Results  Component Value Date   WBC 5.7 02/21/2020   HGB 13.9 02/21/2020   HCT 41.5 02/21/2020   PLT 170 02/21/2020   GLUCOSE 154 (H) 02/21/2020   CHOL 162 02/21/2020   TRIG 46 02/21/2020   HDL 51 02/21/2020   LDLCALC 97 02/21/2020   ALT 8 (L) 02/21/2020   AST 16 02/21/2020   NA 139 02/21/2020   K 3.6 02/21/2020   CL 107 02/21/2020   CREATININE 0.88 02/21/2020   BUN 7 02/21/2020   CO2 25 02/21/2020   TSH 2.24 02/21/2020   PSA 1.1 02/21/2020   INR 0.98 06/27/2011   HGBA1C 4.9 02/21/2020      Assessment & Plan:

## 2020-02-25 ENCOUNTER — Encounter: Payer: Self-pay | Admitting: Internal Medicine

## 2020-02-25 DIAGNOSIS — M79604 Pain in right leg: Secondary | ICD-10-CM | POA: Insufficient documentation

## 2020-02-25 NOTE — Assessment & Plan Note (Signed)
For oral replacement 

## 2020-02-25 NOTE — Assessment & Plan Note (Signed)
stable overall by history and exam, recent data reviewed with pt, and pt to continue medical treatment as before,  to f/u any worsening symptoms or concerns  

## 2020-02-25 NOTE — Assessment & Plan Note (Addendum)
Possible claudication - for aterial study,  to f/u any worsening symptoms or concerns  I spent 31 minutes in addition to time for CPX wellness examination in preparing to see the patient by review of recent labs, imaging and procedures, obtaining and reviewing separately obtained history, communicating with the patient and family or caregiver, ordering medications, tests or procedures, and documenting clinical information in the EHR including the differential Dx, treatment, and any further evaluation and other management of bilateral leg pain, smoking, hyperglycemia, htn

## 2020-02-25 NOTE — Assessment & Plan Note (Signed)
Counseled to quit 

## 2020-02-25 NOTE — Assessment & Plan Note (Signed)

## 2020-02-28 ENCOUNTER — Encounter: Payer: Self-pay | Admitting: Internal Medicine

## 2020-02-28 ENCOUNTER — Ambulatory Visit (HOSPITAL_COMMUNITY)
Admission: RE | Admit: 2020-02-28 | Discharge: 2020-02-28 | Disposition: A | Discharge status: Home / Self Care | Payer: 59 | Source: Ambulatory Visit | Attending: Internal Medicine | Admitting: Internal Medicine

## 2020-02-28 ENCOUNTER — Other Ambulatory Visit: Payer: Self-pay

## 2020-02-28 ENCOUNTER — Other Ambulatory Visit: Payer: Self-pay | Admitting: Internal Medicine

## 2020-02-28 DIAGNOSIS — M79604 Pain in right leg: Secondary | ICD-10-CM | POA: Diagnosis not present

## 2020-02-28 DIAGNOSIS — M79605 Pain in left leg: Secondary | ICD-10-CM | POA: Insufficient documentation

## 2020-02-28 DIAGNOSIS — I739 Peripheral vascular disease, unspecified: Secondary | ICD-10-CM

## 2020-03-05 ENCOUNTER — Other Ambulatory Visit: Payer: Self-pay | Admitting: *Deleted

## 2020-03-05 DIAGNOSIS — F17210 Nicotine dependence, cigarettes, uncomplicated: Secondary | ICD-10-CM

## 2020-03-19 ENCOUNTER — Encounter: Payer: Self-pay | Admitting: Acute Care

## 2020-03-19 ENCOUNTER — Other Ambulatory Visit: Payer: Self-pay

## 2020-03-19 ENCOUNTER — Ambulatory Visit (INDEPENDENT_AMBULATORY_CARE_PROVIDER_SITE_OTHER): Payer: 59 | Admitting: Acute Care

## 2020-03-19 ENCOUNTER — Ambulatory Visit (INDEPENDENT_AMBULATORY_CARE_PROVIDER_SITE_OTHER)
Admission: RE | Admit: 2020-03-19 | Discharge: 2020-03-19 | Disposition: A | Discharge status: Home / Self Care | Payer: 59 | Source: Ambulatory Visit | Attending: Acute Care | Admitting: Acute Care

## 2020-03-19 VITALS — BP 108/60 | HR 72 | Temp 97.8°F | Ht 72.0 in | Wt 140.2 lb

## 2020-03-19 DIAGNOSIS — F17210 Nicotine dependence, cigarettes, uncomplicated: Secondary | ICD-10-CM

## 2020-03-19 DIAGNOSIS — Z122 Encounter for screening for malignant neoplasm of respiratory organs: Secondary | ICD-10-CM

## 2020-03-19 DIAGNOSIS — Z87891 Personal history of nicotine dependence: Secondary | ICD-10-CM

## 2020-03-19 NOTE — Progress Notes (Signed)
Shared Decision Making Visit Lung Cancer Screening Program [phone])   Eligibility:  Age 64 y.o.  Pack Years Smoking History Calculation 84 pack year smoking history (# packs/per year x # years smoked)  Recent History of coughing up blood  no  Unexplained weight loss? no ( >Than 15 pounds within the last 6 months )  Prior History Lung / other cancer no (Diagnosis within the last 5 years already requiring surveillance chest CT Scans).  Smoking Status Former Smoker  Former Smokers: Years since quit: < 1 year  Quit Date: 02/18/2020  Visit Components:  Discussion included one or more decision making aids. yes  Discussion included risk/benefits of screening. yes  Discussion included potential follow up diagnostic testing for abnormal scans. yes  Discussion included meaning and risk of over diagnosis. yes  Discussion included meaning and risk of False Positives. yes  Discussion included meaning of total radiation exposure. yes  Counseling Included:  Importance of adherence to annual lung cancer LDCT screening. yes  Impact of comorbidities on ability to participate in the program. yes  Ability and willingness to under diagnostic treatment. yes  Smoking Cessation Counseling:  Current Smokers:   Discussed importance of smoking cessation. yes  Information about tobacco cessation classes and interventions provided to patient. yes  Patient provided with "ticket" for LDCT Scan. yes  Symptomatic Patient. no  Counseling  Diagnosis Code: Tobacco Use Z72.0  Asymptomatic Patient yes  Counseling (Intermediate counseling: > three minutes counseling) T0626  Former Smokers:   Discussed the importance of maintaining cigarette abstinence. yes  Diagnosis Code: Personal History of Nicotine Dependence. R48.546  Information about tobacco cessation classes and interventions provided to patient. Yes  Patient provided with "ticket" for LDCT Scan. yes  Written Order for Lung  Cancer Screening with LDCT placed in Epic. Yes (CT Chest Lung Cancer Screening Low Dose W/O CM) [id number] Z12.2-Screening of respiratory organs Z87.891-Personal history of nicotine dependence  BP 108/60   Pulse 72   Temp 97.8 F (36.6 C) (Oral)   Ht 6' (1.829 m)   Wt 140 lb 3.2 oz (63.6 kg)   SpO2 98%   BMI 19.01 kg/m    I have spent 25 minutes of face to face time with Mr. Tavis discussing the risks and benefits of lung cancer screening. We viewed a power point together that explained in detail the above noted topics. We paused at intervals to allow for questions to be asked and answered to ensure understanding.We discussed that the single most powerful action that he can take to decrease his risk of developing lung cancer is to quit smoking. We discussed whether or not he is ready to commit to setting a quit date. We discussed options for tools to aid in quitting smoking including nicotine replacement therapy, non-nicotine medications, support groups, Quit Smart classes, and behavior modification. We discussed that often times setting smaller, more achievable goals, such as eliminating 1 cigarette a day for a week and then 2 cigarettes a day for a week can be helpful in slowly decreasing the number of cigarettes smoked. This allows for a sense of accomplishment as well as providing a clinical benefit. I gave him the " Be Stronger Than Your Excuses" card with contact information for community resources, classes, free nicotine replacement therapy, and access to mobile apps, text messaging, and on-line smoking cessation help. I have also given him my card and contact information in the event he needs to contact me. We discussed the time and location of the scan,  and that either Doroteo Glassman RN or I will call with the results within 24-48 hours of receiving them. I have offered him  a copy of the power point we viewed  as a resource in the event they need reinforcement of the concepts we discussed today  in the office. The patient verbalized understanding of all of  the above and had no further questions upon leaving the office. They have my contact information in the event they have any further questions.  I spent 3 minutes counseling on smoking cessation and the health risks of continued tobacco abuse.  I explained to the patient that there has been a high incidence of coronary artery disease noted on these exams. I explained that this is a non-gated exam therefore degree or severity cannot be determined. This patient is currently on statin therapy. I have asked the patient to follow-up with their PCP regarding any incidental finding of coronary artery disease and management with diet or medication as their PCP  feels is clinically indicated. The patient verbalized understanding of the above and had no further questions upon completion of the visit.      Magdalen Spatz, NP 03/19/2020 3:30 PM

## 2020-03-19 NOTE — Patient Instructions (Signed)
Thank you for participating in the [facility] Scre[facility]. It was our pleasure to meet you today. We will call you with the results of your scan within the next few days. Your scan will be assigned a Lung RADS category score by the physicians reading the scans.  This Lung RADS score determines follow up scanning.  See below for description of categories, and follow up screening recommendations. We will be in touch to schedule your follow up screening annually or based on recommendations of our providers. We will fax a copy of your scan results to your Primary Care Physician, or the physician who referred you to the program, to ensure they have the results. Please call the office if you have any questions or concerns regarding your scanning experience or results.  Our office number is [phone]. Please speak with Denise Phelps, RN. She is our Lung Cancer Screening RN. If she is unavailable when you call, please have the office staff send her a message. She will return your call at her earliest convenience. Remember, if your scan is normal, we will scan you annually as long as you continue to meet the criteria for the program. (Age 63-77, Current smoker or smoker who has quit within the last 15 years). If you are a smoker, remember, quitting is the single most powerful action that you can take to decrease your risk of lung cancer and other pulmonary, breathing related problems. We know quitting is hard, and we are here to help.  Please let us know if there is anything we can do to help you meet your goal of quitting. If you are a former smoker, congratulations. We are proud of you! Remain smoke free! Remember you can refer friends or family members through the number above.  We will screen them to make sure they meet criteria for the program. Thank you for helping us take better care of you by participating in Lung Screening.  Lung RADS Categories:  Lung RADS 1: no nodules  or definitely non-concerning nodules.  Recommendation is for a repeat annual scan in 12 months.  Lung RADS 2:  nodules that are non-concerning in appearance and behavior with a very low likelihood of becoming an active cancer. Recommendation is for a repeat annual scan in 12 months.  Lung RADS 3: nodules that are probably non-concerning , includes nodules with a low likelihood of becoming an active cancer.  Recommendation is for a 6-month repeat screening scan. Often noted after an upper respiratory illness. We will be in touch to make sure you have no questions, and to schedule your 6-month scan.  Lung RADS 4 A: nodules with concerning findings, recommendation is most often for a follow up scan in 3 months or additional testing based on our provider's assessment of the scan. We will be in touch to make sure you have no questions and to schedule the recommended 3 month follow up scan.  Lung RADS 4 B:  indicates findings that are concerning. We will be in touch with you to schedule additional diagnostic testing based on our provider's  assessment of the scan.   

## 2020-03-22 NOTE — Progress Notes (Signed)
I have attempted to call the patient with the results of his low dose CT. There was no answer at either his home of mobile number.  I have left HIPPA compliant messages on both his mobile and home numbers.  I have requested the patient call the office for his results.

## 2020-03-23 ENCOUNTER — Telehealth: Payer: Self-pay | Admitting: Acute Care

## 2020-03-23 DIAGNOSIS — R918 Other nonspecific abnormal finding of lung field: Secondary | ICD-10-CM

## 2020-03-23 NOTE — Telephone Encounter (Signed)
Orders are placed.  He can get them drawn when in office on 03/28/20 or 03/29/20 when he see's dr. Valeta Harms

## 2020-03-23 NOTE — Addendum Note (Signed)
Addended by: Amado Coe on: 03/23/2020 04:28 PM   Modules accepted: Orders

## 2020-03-23 NOTE — Telephone Encounter (Signed)
Lm for patient.   Will route to lung nodule pool.

## 2020-03-23 NOTE — Telephone Encounter (Signed)
PCCM:   I called and spoke with the patient regarding his LDCT results.   Please schedule appt to see me in clinic 03/28/2020 or 03/29/2020 lung nodule slot 30 mins  Patient is agreeable to proceed with bronchoscopy NAV + EBUS   We will schedule for Friday AM 03/30/2020 [facility]. (there are AM slots held)   Orders placed for [facility] Ct to be complete before case.  Ok to schedule PET next available.    Garner Nash, DO [facility] 03/23/2020 12:26 PM

## 2020-03-23 NOTE — Telephone Encounter (Signed)
Pt has been scheduled for CT and covid test on 10/12.  [facility] CT will have disk sent to [facility].  ENB/EBUS sched for 10/15 at 7:45.  PET scan sched for 10/18.  I have left pt a vm to call me for appt info.  Lauren can you put in order for labs please.

## 2020-03-25 NOTE — Telephone Encounter (Signed)
PCCM: Can we make sure he knows about this? Also, he needs to be scheduled for an appt with me on wed/thurs of this week.  Thanks Garner Nash, DO [facility] 03/25/2020 10:45 AM

## 2020-03-26 ENCOUNTER — Ambulatory Visit: Payer: 59 | Admitting: Vascular Surgery

## 2020-03-26 ENCOUNTER — Encounter: Payer: Self-pay | Admitting: Vascular Surgery

## 2020-03-26 ENCOUNTER — Other Ambulatory Visit: Payer: Self-pay

## 2020-03-26 VITALS — BP 127/67 | HR 87 | Temp 99.2°F | Resp 14 | Ht 72.0 in | Wt 142.0 lb

## 2020-03-26 DIAGNOSIS — I70213 Atherosclerosis of native arteries of extremities with intermittent claudication, bilateral legs: Secondary | ICD-10-CM

## 2020-03-26 NOTE — Progress Notes (Signed)
[facility] [place]  Patient name: Nicolas Allen MRN: [id number] DOB: 08-Jun-1956 Sex: male  REASON FOR CONSULT: Evaluation bilateral lower extremity claudication  HPI: Nicolas Allen is a 64 y.o. male, who is here today for evaluation.  He is here with his wife.  He has straightforward bilateral calf claudication symptoms.  He does a great deal of walking.  He reports that if he walks approximately 1 mile to a nearby store he can have a burning sensation in his calves and this is relieved with rest.  Interestingly he has less symptoms on his return walk.  He has no arterial rest pain and no lower extremity tissue loss.  He quit cigarette smoking on 02/22/2020.  I certainly congratulated him on this.  Explained that this is undoubtedly the most important thing he can do for improving his health.  Denies any cardiac disease and no history of stroke  Past Medical History:  Diagnosis Date  . Anemia 06/24/2012  . ANXIETY 02/03/2007  . Cervical disc disease 02/12/2012   S/p surgury jan 2013  . Erectile dysfunction 02/11/2011  . GERD (gastroesophageal reflux disease)   . GLUCOSE INTOLERANCE 01/04/2008  . HYPERLIPIDEMIA 02/03/2007  . HYPERTENSION 02/03/2007  . IBS (irritable bowel syndrome)   . Impaired glucose tolerance 02/11/2011  . Smoker 08/22/2014  . Substance abuse ([place]) 2001   sober for 16 yrs    Family History  Problem Relation Age of Onset  . Cancer Mother        esophagus  . Cancer Cousin   . Stroke Other   . Hypertension Other   . Colon cancer Neg Hx     SOCIAL HISTORY: Social History   Socioeconomic History  . Marital status: Married    Spouse name: Not on file  . Number of children: Not on file  . Years of education: Not on file  . Highest education level: Not on file  Occupational History  . Occupation: janitor  Tobacco Use  . Smoking status: Former Smoker    Packs/day: 1.50    Years: 30.00    Pack years: 45.00     Types: Cigarettes    Quit date: 02/18/2020    Years since quitting: 0.1  . Smokeless tobacco: Former Systems developer    Types: Chew  Substance and Sexual Activity  . Alcohol use: No    Alcohol/week: 0.0 standard drinks    Comment: quit 2000 prior heavy  . Drug use: No  . Sexual activity: Not on file  Other Topics Concern  . Not on file  Social History Narrative  . Not on file   Social Determinants of Health   Financial Resource Strain:   . Difficulty of Paying Living Expenses: Not on file  Food Insecurity:   . Worried About Charity fundraiser in the Last Year: Not on file  . Ran Out of Food in the Last Year: Not on file  Transportation Needs:   . Lack of Transportation (Medical): Not on file  . Lack of Transportation (Non-Medical): Not on file  Physical Activity:   . Days of Exercise per Week: Not on file  . Minutes of Exercise per Session: Not on file  Stress:   . Feeling of Stress : Not on file  Social Connections:   . Frequency of Communication with Friends and Family: Not on file  . Frequency of Social Gatherings with Friends and Family: Not on file  . Attends Religious Services: Not on  file  . Active Member of Clubs or Organizations: Not on file  . Attends Archivist Meetings: Not on file  . Marital Status: Not on file  Intimate Partner Violence:   . Fear of Current or Ex-Partner: Not on file  . Emotionally Abused: Not on file  . Physically Abused: Not on file  . Sexually Abused: Not on file    No Known Allergies  Current Outpatient Medications  Medication Sig Dispense Refill  . amLODipine-benazepril (LOTREL) 10-20 MG capsule Take 1 capsule by mouth daily. Annual appt due in Sept must see provider for future refills 30 capsule 2  . aspirin EC 81 MG tablet Take 81 mg by mouth daily. Swallow whole.    . b complex vitamins tablet Take 1 tablet by mouth daily.    . Garlic 2951 MG CAPS Take 1,000 mg by mouth daily.     . Omega-3 Fatty Acids (FISH OIL PO) Take 1  capsule by mouth daily.     . pravastatin (PRAVACHOL) 40 MG tablet Take 1 tablet (40 mg total) by mouth daily. Annual appt due in Sept must see provider for future refills 30 tablet 2  . vardenafil (LEVITRA) 20 MG tablet TAKE 1 TABLET BY MOUTH AS NEEDED FOR  ERECTILE  DYSFUNCTION 10 tablet 5  . Vitamin D, Ergocalciferol, (DRISDOL) 1.25 MG (50000 UNIT) CAPS capsule Take 1 capsule (50,000 Units total) by mouth every 7 (seven) days. (Patient taking differently: Take 50,000 Units by mouth every Sunday. ) 12 capsule 0   No current facility-administered medications for this visit.    REVIEW OF SYSTEMS:  [X]  denotes positive finding, [ ]  denotes negative finding Cardiac  Comments:  Chest pain or chest pressure:    Shortness of breath upon exertion:    Short of breath when lying flat:    Irregular heart rhythm:        Vascular    Pain in calf, thigh, or hip brought on by ambulation: x   Pain in feet at night that wakes you up from your sleep:     Blood clot in your veins:    Leg swelling:         Pulmonary    Oxygen at home:    Productive cough:     Wheezing:         Neurologic    Sudden weakness in arms or legs:     Sudden numbness in arms or legs:     Sudden onset of difficulty speaking or slurred speech:    Temporary loss of vision in one eye:     Problems with dizziness:         Gastrointestinal    Blood in stool:     Vomited blood:         Genitourinary    Burning when urinating:     Blood in urine:        Psychiatric    Major depression:         Hematologic    Bleeding problems:    Problems with blood clotting too easily:        Skin    Rashes or ulcers:        Constitutional    Fever or chills:      PHYSICAL EXAM: Vitals:   03/26/20 1430  BP: 127/67  Pulse: 87  Resp: 14  Temp: 99.2 F (37.3 C)  TempSrc: Other (Comment)  SpO2: 96%  Weight: 142 lb (64.4 kg)  Height: 6' (  1.829 m)    GENERAL: The patient is a well-nourished male, in no acute distress.  The vital signs are documented above. CARDIOVASCULAR: Carotid arteries without bruits bilaterally.  2+ radial and 2+ femoral pulses bilaterally.  Absent popliteal and absent distal pulses bilaterally PULMONARY: There is good air exchange  ABDOMEN: Soft and non-tender  MUSCULOSKELETAL: There are no major deformities or cyanosis. NEUROLOGIC: No focal weakness or paresthesias are detected. SKIN: There are no ulcers or rashes noted. PSYCHIATRIC: The patient has a normal affect.  DATA:  Noninvasive studies from previous office on 02/28/2020 were reviewed.  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  I discussed this at length with the patient and his wife.  Explained that he is not at risk for limb threatening ischemia at this present time.  Also explained that he is already accomplished the most important aspect of treatment which is cigarette smoking cessation.  I have encouraged him to continue his walking program.  I did discuss the signs and symptoms of critical limb ischemia and he will notify us immediately should this occur.  I did discuss endovascular and open surgical treatment for superficial femoral artery disease.  He understands and will notify should he develop any worsening difficulties   Rosetta Posner, MD [facility] Tel [phone] Pager [phone]

## 2020-03-26 NOTE — Telephone Encounter (Addendum)
I have spoken to pt & gave him appt info.  I have scheduled him for appt for this week with BI.

## 2020-03-26 NOTE — Telephone Encounter (Signed)
Someone for pt returned my call Friday evening at 6 after I had already left for the day.  I have called back this morning and left another vm for them to call me.

## 2020-03-27 ENCOUNTER — Other Ambulatory Visit (HOSPITAL_COMMUNITY)
Admission: RE | Admit: 2020-03-27 | Discharge: 2020-03-27 | Disposition: A | Discharge status: Home / Self Care | Payer: 59 | Source: Ambulatory Visit | Attending: Pulmonary Disease | Admitting: Pulmonary Disease

## 2020-03-27 ENCOUNTER — Other Ambulatory Visit (HOSPITAL_COMMUNITY): Payer: 59

## 2020-03-27 ENCOUNTER — Ambulatory Visit (INDEPENDENT_AMBULATORY_CARE_PROVIDER_SITE_OTHER)
Admission: RE | Admit: 2020-03-27 | Discharge: 2020-03-27 | Disposition: A | Discharge status: Home / Self Care | Payer: 59 | Source: Ambulatory Visit | Attending: Pulmonary Disease | Admitting: Pulmonary Disease

## 2020-03-27 DIAGNOSIS — Z20822 Contact with and (suspected) exposure to covid-19: Secondary | ICD-10-CM | POA: Diagnosis not present

## 2020-03-27 DIAGNOSIS — R918 Other nonspecific abnormal finding of lung field: Secondary | ICD-10-CM | POA: Diagnosis not present

## 2020-03-27 DIAGNOSIS — Z01818 Encounter for other preprocedural examination: Secondary | ICD-10-CM | POA: Diagnosis present

## 2020-03-27 LAB — SARS CORONAVIRUS 2 (TAT 6-24 HRS): SARS Coronavirus 2: NEGATIVE

## 2020-03-27 NOTE — Progress Notes (Signed)
Synopsis: Referred in October 2021 for lung mass by [name], MD  Subjective:   PATIENT ID: Nicolas Allen GENDER: male DOB: 1955-12-01, MRN: [id number]  Chief Complaint  Patient presents with  . Follow-up    follow up before bronchoscopy 03/30/20, denies sob, cough    This is a 64 year old gentleman, former smoker recently enrolled into the lung cancer screening program, medical history include hypertension hyperlipidemia and gastroesophageal reflux.  Patient had a lung cancer screening CT that was completed on 03/19/2020 which revealed bilateral lung masses concerning for malignancy.  He has a posterior right upper lobe mass that involves the lateral chest wall that is 2.4 x 4.9 x 2.5 cm, a 2.5 cm subsolid nodule within the right lower lobe as well as a 4.5 cm cystic solid lesion within the left lower lobe.  Patient was referred today for further evaluation.  As well as discussed utility of bronchoscopy and tissue diagnosis. fmhx cousin with lung cancer.  Denies fevers chills night sweats weight loss or hemoptysis.   Past Medical History:  Diagnosis Date  . Anemia 06/24/2012  . ANXIETY 02/03/2007  . Cervical disc disease 02/12/2012   S/p surgury jan 2013  . Erectile dysfunction 02/11/2011  . GERD (gastroesophageal reflux disease)   . GLUCOSE INTOLERANCE 01/04/2008  . HYPERLIPIDEMIA 02/03/2007  . HYPERTENSION 02/03/2007  . IBS (irritable bowel syndrome)   . Impaired glucose tolerance 02/11/2011  . Smoker 08/22/2014  . Substance abuse ([place]) 2001   sober for 16 yrs     Family History  Problem Relation Age of Onset  . Cancer Mother        esophagus  . Cancer Cousin   . Stroke Other   . Hypertension Other   . Colon cancer Neg Hx      Past Surgical History:  Procedure Laterality Date  . COLONOSCOPY  2007  . neck fusion  2012   C4  . NO PAST SURGERIES      Social History   Socioeconomic History  . Marital status: Married    Spouse name: Not on file  . Number of children:  Not on file  . Years of education: Not on file  . Highest education level: Not on file  Occupational History  . Occupation: janitor  Tobacco Use  . Smoking status: Former Smoker    Packs/day: 1.50    Years: 30.00    Pack years: 45.00    Types: Cigarettes    Quit date: 02/18/2020    Years since quitting: 0.1  . Smokeless tobacco: Former Systems developer    Types: Secondary school teacher  . Vaping Use: Never used  Substance and Sexual Activity  . Alcohol use: No    Alcohol/week: 0.0 standard drinks    Comment: quit 2000 prior heavy  . Drug use: No  . Sexual activity: Not on file  Other Topics Concern  . Not on file  Social History Narrative  . Not on file   Social Determinants of Health   Financial Resource Strain:   . Difficulty of Paying Living Expenses: Not on file  Food Insecurity:   . Worried About Charity fundraiser in the Last Year: Not on file  . Ran Out of Food in the Last Year: Not on file  Transportation Needs:   . Lack of Transportation (Medical): Not on file  . Lack of Transportation (Non-Medical): Not on file  Physical Activity:   . Days of Exercise per Week: Not on file  .  Minutes of Exercise per Session: Not on file  Stress:   . Feeling of Stress : Not on file  Social Connections:   . Frequency of Communication with Friends and Family: Not on file  . Frequency of Social Gatherings with Friends and Family: Not on file  . Attends Religious Services: Not on file  . Active Member of Clubs or Organizations: Not on file  . Attends Archivist Meetings: Not on file  . Marital Status: Not on file  Intimate Partner Violence:   . Fear of Current or Ex-Partner: Not on file  . Emotionally Abused: Not on file  . Physically Abused: Not on file  . Sexually Abused: Not on file     No Known Allergies   Outpatient Medications Prior to Visit  Medication Sig Dispense Refill  . amLODipine-benazepril (LOTREL) 10-20 MG capsule Take 1 capsule by mouth daily. Annual appt due in  Sept must see provider for future refills 30 capsule 2  . aspirin EC 81 MG tablet Take 81 mg by mouth daily. Swallow whole.    . b complex vitamins tablet Take 1 tablet by mouth daily.    . Garlic 1610 MG CAPS Take 1,000 mg by mouth daily.     . Omega-3 Fatty Acids (FISH OIL PO) Take 1 capsule by mouth daily.     . pravastatin (PRAVACHOL) 40 MG tablet Take 1 tablet (40 mg total) by mouth daily. Annual appt due in Sept must see provider for future refills 30 tablet 2  . vardenafil (LEVITRA) 20 MG tablet TAKE 1 TABLET BY MOUTH AS NEEDED FOR  ERECTILE  DYSFUNCTION 10 tablet 5  . Vitamin D, Ergocalciferol, (DRISDOL) 1.25 MG (50000 UNIT) CAPS capsule Take 1 capsule (50,000 Units total) by mouth every 7 (seven) days. (Patient taking differently: Take 50,000 Units by mouth every Sunday. ) 12 capsule 0   No facility-administered medications prior to visit.    Review of Systems  Constitutional: Negative for chills, fever, malaise/fatigue and weight loss.  HENT: Negative for hearing loss, sore throat and tinnitus.   Eyes: Negative for blurred vision and double vision.  Respiratory: Positive for cough and shortness of breath. Negative for hemoptysis, sputum production, wheezing and stridor.   Cardiovascular: Negative for chest pain, palpitations, orthopnea, leg swelling and PND.  Gastrointestinal: Negative for abdominal pain, constipation, diarrhea, heartburn, nausea and vomiting.  Genitourinary: Negative for dysuria, hematuria and urgency.  Musculoskeletal: Negative for joint pain and myalgias.  Skin: Negative for itching and rash.  Neurological: Negative for dizziness, tingling, weakness and headaches.  Endo/Heme/Allergies: Negative for environmental allergies. Does not bruise/bleed easily.  Psychiatric/Behavioral: Negative for depression. The patient is not nervous/anxious and does not have insomnia.   All other systems reviewed and are negative.    Objective:  Physical Exam Vitals reviewed.    Constitutional:      General: He is not in acute distress.    Appearance: He is well-developed.  HENT:     Head: Normocephalic and atraumatic.     Mouth/Throat:     Pharynx: No oropharyngeal exudate.  Eyes:     Conjunctiva/sclera: Conjunctivae normal.     Pupils: Pupils are equal, round, and reactive to light.  Neck:     Vascular: No JVD.     Trachea: No tracheal deviation.     Comments: Loss of supraclavicular fat Cardiovascular:     Rate and Rhythm: Normal rate and regular rhythm.     Heart sounds: S1 normal and S2 normal.  Comments: Distant heart tones Pulmonary:     Effort: No tachypnea or accessory muscle usage.     Breath sounds: No stridor. Decreased breath sounds (throughout all lung fields) present. No wheezing, rhonchi or rales.     Comments: Diminished bl  Abdominal:     General: Bowel sounds are normal. There is no distension.     Palpations: Abdomen is soft.     Tenderness: There is no abdominal tenderness.  Musculoskeletal:        General: Deformity (muscle wasting ) present.  Skin:    General: Skin is warm and dry.     Capillary Refill: Capillary refill takes less than 2 seconds.     Findings: No rash.  Neurological:     Mental Status: He is alert and oriented to person, place, and time.  Psychiatric:        Behavior: Behavior normal.      Vitals:   03/28/20 0957  BP: 118/60  Pulse: 86  Temp: (!) 97.4 F (36.3 C)  TempSrc: Skin  SpO2: 99%  Weight: 143 lb 6.4 oz (65 kg)  Height: 6' (1.829 m)   99% on RA BMI Readings from Last 3 Encounters:  03/28/20 19.45 kg/m  03/26/20 19.26 kg/m  03/19/20 19.01 kg/m   Wt Readings from Last 3 Encounters:  03/28/20 143 lb 6.4 oz (65 kg)  03/26/20 142 lb (64.4 kg)  03/19/20 140 lb 3.2 oz (63.6 kg)     CBC    Component Value Date/Time   WBC 5.7 02/21/2020 1115   RBC 4.84 02/21/2020 1115   HGB 13.9 02/21/2020 1115   HCT 41.5 02/21/2020 1115   PLT 170 02/21/2020 1115   MCV 85.7 02/21/2020 1115    MCH 28.7 02/21/2020 1115   MCHC 33.5 02/21/2020 1115   RDW 14.2 02/21/2020 1115   LYMPHSABS 2,200 02/21/2020 1115   MONOABS 0.6 02/22/2019 1153   EOSABS 467 02/21/2020 1115   BASOSABS 29 02/21/2020 1115     Chest Imaging:  03/16/2020: CT scan of the chest Multiple bilateral solid and subsolid nodules with a left lower lobe cavitary lesion. Concerning for multiple areas of carcinoma. The patient's images have been independently reviewed by me.    Pulmonary Functions Testing Results: No flowsheet data found.  FeNO:   Pathology:   Echocardiogram:   Heart Catheterization:     Assessment & Plan:     ICD-10-CM   1. Mass of right lung  R91.8   2. Cavitating mass of lower lobe of left lung  J98.4   3. Mediastinal adenopathy  R59.0   4. Multiple lung nodules  R91.8   5. Former smoker  Z87.891     Discussion:  This is a 65 year old gentleman, former smoker abnormal lung cancer screening CT.  He has multifocal disease within bilateral lungs all with various morphologies.  He has upper lobe groundglass semisolid nodule with additional right lower lobe and middle lobe nodules 1 more solid spiculated and a more groundglass appearance of an additional 1.  He has a left lower lobe cavitary lesion that is concerning for malignancy as well.  Each of these could represent different pathologies.  I believe that there is a chance that he has 2 separate malignancies occurring.  We discussed the importance of biopsying tissue sampling of this today in the office.  Plan: We will plan for a navigational bronchoscopy with multisite target intervention on Friday morning. We discussed the risk benefits and alternatives of proceeding with this. We  discussed the risk to include bleeding and pneumothorax. This is an outpatient procedure we discussed the hopes with no need for keeping in the hospital for observation but would reserve this if needed depending upon any issues postoperatively. This  risk was also discussed with the patient and his wife present today in the office. Patient is agreeable to proceed at this time.  Patient will time spent today in the office reviewing CT imaging with the patient and patient's family as well as planning of the navigational targets.  Additional time spent placing preoperative orders and ensuring coordination with PCC's.  Also nuclear medicine PET scan imaging has been ordered and scheduled for the 18th.   Current Outpatient Medications:  .  amLODipine-benazepril (LOTREL) 10-20 MG capsule, Take 1 capsule by mouth daily. Annual appt due in Sept must see provider for future refills, Disp: 30 capsule, Rfl: 2 .  aspirin EC 81 MG tablet, Take 81 mg by mouth daily. Swallow whole., Disp: , Rfl:  .  b complex vitamins tablet, Take 1 tablet by mouth daily., Disp: , Rfl:  .  Garlic 1219 MG CAPS, Take 1,000 mg by mouth daily. , Disp: , Rfl:  .  Omega-3 Fatty Acids (FISH OIL PO), Take 1 capsule by mouth daily. , Disp: , Rfl:  .  pravastatin (PRAVACHOL) 40 MG tablet, Take 1 tablet (40 mg total) by mouth daily. Annual appt due in Sept must see provider for future refills, Disp: 30 tablet, Rfl: 2 .  vardenafil (LEVITRA) 20 MG tablet, TAKE 1 TABLET BY MOUTH AS NEEDED FOR  ERECTILE  DYSFUNCTION, Disp: 10 tablet, Rfl: 5 .  Vitamin D, Ergocalciferol, (DRISDOL) 1.25 MG (50000 UNIT) CAPS capsule, Take 1 capsule (50,000 Units total) by mouth every 7 (seven) days. (Patient taking differently: Take 50,000 Units by mouth every Sunday. ), Disp: 12 capsule, Rfl: 0  I spent 61 minutes dedicated to the care of this patient on the date of this encounter to include pre-visit review of records, face-to-face time with the patient discussing conditions above, post visit ordering of testing, clinical documentation with the electronic health record, making appropriate referrals as documented, and communicating necessary findings to members of the patients care team.   Garner Nash,  DO [facility] 03/28/2020 10:27 AM

## 2020-03-27 NOTE — H&P (View-Only) (Signed)
Synopsis: Referred in October 2021 for lung mass by [name], MD  Subjective:   PATIENT ID: Nicolas Allen GENDER: male DOB: 18-Jun-1955, MRN: [id number]  Chief Complaint  Patient presents with  . Follow-up    follow up before bronchoscopy 03/30/20, denies sob, cough    This is a 64 year old gentleman, former smoker recently enrolled into the lung cancer screening program, medical history include hypertension hyperlipidemia and gastroesophageal reflux.  Patient had a lung cancer screening CT that was completed on 03/19/2020 which revealed bilateral lung masses concerning for malignancy.  He has a posterior right upper lobe mass that involves the lateral chest wall that is 2.4 x 4.9 x 2.5 cm, a 2.5 cm subsolid nodule within the right lower lobe as well as a 4.5 cm cystic solid lesion within the left lower lobe.  Patient was referred today for further evaluation.  As well as discussed utility of bronchoscopy and tissue diagnosis. fmhx cousin with lung cancer.  Denies fevers chills night sweats weight loss or hemoptysis.   Past Medical History:  Diagnosis Date  . Anemia 06/24/2012  . ANXIETY 02/03/2007  . Cervical disc disease 02/12/2012   S/p surgury jan 2013  . Erectile dysfunction 02/11/2011  . GERD (gastroesophageal reflux disease)   . GLUCOSE INTOLERANCE 01/04/2008  . HYPERLIPIDEMIA 02/03/2007  . HYPERTENSION 02/03/2007  . IBS (irritable bowel syndrome)   . Impaired glucose tolerance 02/11/2011  . Smoker 08/22/2014  . Substance abuse ([place]) 2001   sober for 16 yrs     Family History  Problem Relation Age of Onset  . Cancer Mother        esophagus  . Cancer Cousin   . Stroke Other   . Hypertension Other   . Colon cancer Neg Hx      Past Surgical History:  Procedure Laterality Date  . COLONOSCOPY  2007  . neck fusion  2012   C4  . NO PAST SURGERIES      Social History   Socioeconomic History  . Marital status: Married    Spouse name: Not on file  . Number of children:  Not on file  . Years of education: Not on file  . Highest education level: Not on file  Occupational History  . Occupation: janitor  Tobacco Use  . Smoking status: Former Smoker    Packs/day: 1.50    Years: 30.00    Pack years: 45.00    Types: Cigarettes    Quit date: 02/18/2020    Years since quitting: 0.1  . Smokeless tobacco: Former Systems developer    Types: Secondary school teacher  . Vaping Use: Never used  Substance and Sexual Activity  . Alcohol use: No    Alcohol/week: 0.0 standard drinks    Comment: quit 2000 prior heavy  . Drug use: No  . Sexual activity: Not on file  Other Topics Concern  . Not on file  Social History Narrative  . Not on file   Social Determinants of Health   Financial Resource Strain:   . Difficulty of Paying Living Expenses: Not on file  Food Insecurity:   . Worried About Charity fundraiser in the Last Year: Not on file  . Ran Out of Food in the Last Year: Not on file  Transportation Needs:   . Lack of Transportation (Medical): Not on file  . Lack of Transportation (Non-Medical): Not on file  Physical Activity:   . Days of Exercise per Week: Not on file  .  Minutes of Exercise per Session: Not on file  Stress:   . Feeling of Stress : Not on file  Social Connections:   . Frequency of Communication with Friends and Family: Not on file  . Frequency of Social Gatherings with Friends and Family: Not on file  . Attends Religious Services: Not on file  . Active Member of Clubs or Organizations: Not on file  . Attends Archivist Meetings: Not on file  . Marital Status: Not on file  Intimate Partner Violence:   . Fear of Current or Ex-Partner: Not on file  . Emotionally Abused: Not on file  . Physically Abused: Not on file  . Sexually Abused: Not on file     No Known Allergies   Outpatient Medications Prior to Visit  Medication Sig Dispense Refill  . amLODipine-benazepril (LOTREL) 10-20 MG capsule Take 1 capsule by mouth daily. Annual appt due in  Sept must see provider for future refills 30 capsule 2  . aspirin EC 81 MG tablet Take 81 mg by mouth daily. Swallow whole.    . b complex vitamins tablet Take 1 tablet by mouth daily.    . Garlic 9983 MG CAPS Take 1,000 mg by mouth daily.     . Omega-3 Fatty Acids (FISH OIL PO) Take 1 capsule by mouth daily.     . pravastatin (PRAVACHOL) 40 MG tablet Take 1 tablet (40 mg total) by mouth daily. Annual appt due in Sept must see provider for future refills 30 tablet 2  . vardenafil (LEVITRA) 20 MG tablet TAKE 1 TABLET BY MOUTH AS NEEDED FOR  ERECTILE  DYSFUNCTION 10 tablet 5  . Vitamin D, Ergocalciferol, (DRISDOL) 1.25 MG (50000 UNIT) CAPS capsule Take 1 capsule (50,000 Units total) by mouth every 7 (seven) days. (Patient taking differently: Take 50,000 Units by mouth every Sunday. ) 12 capsule 0   No facility-administered medications prior to visit.    Review of Systems  Constitutional: Negative for chills, fever, malaise/fatigue and weight loss.  HENT: Negative for hearing loss, sore throat and tinnitus.   Eyes: Negative for blurred vision and double vision.  Respiratory: Positive for cough and shortness of breath. Negative for hemoptysis, sputum production, wheezing and stridor.   Cardiovascular: Negative for chest pain, palpitations, orthopnea, leg swelling and PND.  Gastrointestinal: Negative for abdominal pain, constipation, diarrhea, heartburn, nausea and vomiting.  Genitourinary: Negative for dysuria, hematuria and urgency.  Musculoskeletal: Negative for joint pain and myalgias.  Skin: Negative for itching and rash.  Neurological: Negative for dizziness, tingling, weakness and headaches.  Endo/Heme/Allergies: Negative for environmental allergies. Does not bruise/bleed easily.  Psychiatric/Behavioral: Negative for depression. The patient is not nervous/anxious and does not have insomnia.   All other systems reviewed and are negative.    Objective:  Physical Exam Vitals reviewed.   Constitutional:      General: He is not in acute distress.    Appearance: He is well-developed.  HENT:     Head: Normocephalic and atraumatic.     Mouth/Throat:     Pharynx: No oropharyngeal exudate.  Eyes:     Conjunctiva/sclera: Conjunctivae normal.     Pupils: Pupils are equal, round, and reactive to light.  Neck:     Vascular: No JVD.     Trachea: No tracheal deviation.     Comments: Loss of supraclavicular fat Cardiovascular:     Rate and Rhythm: Normal rate and regular rhythm.     Heart sounds: S1 normal and S2 normal.  Comments: Distant heart tones Pulmonary:     Effort: No tachypnea or accessory muscle usage.     Breath sounds: No stridor. Decreased breath sounds (throughout all lung fields) present. No wheezing, rhonchi or rales.     Comments: Diminished bl  Abdominal:     General: Bowel sounds are normal. There is no distension.     Palpations: Abdomen is soft.     Tenderness: There is no abdominal tenderness.  Musculoskeletal:        General: Deformity (muscle wasting ) present.  Skin:    General: Skin is warm and dry.     Capillary Refill: Capillary refill takes less than 2 seconds.     Findings: No rash.  Neurological:     Mental Status: He is alert and oriented to person, place, and time.  Psychiatric:        Behavior: Behavior normal.      Vitals:   03/28/20 0957  BP: 118/60  Pulse: 86  Temp: (!) 97.4 F (36.3 C)  TempSrc: Skin  SpO2: 99%  Weight: 143 lb 6.4 oz (65 kg)  Height: 6' (1.829 m)   99% on RA BMI Readings from Last 3 Encounters:  03/28/20 19.45 kg/m  03/26/20 19.26 kg/m  03/19/20 19.01 kg/m   Wt Readings from Last 3 Encounters:  03/28/20 143 lb 6.4 oz (65 kg)  03/26/20 142 lb (64.4 kg)  03/19/20 140 lb 3.2 oz (63.6 kg)     CBC    Component Value Date/Time   WBC 5.7 02/21/2020 1115   RBC 4.84 02/21/2020 1115   HGB 13.9 02/21/2020 1115   HCT 41.5 02/21/2020 1115   PLT 170 02/21/2020 1115   MCV 85.7 02/21/2020 1115    MCH 28.7 02/21/2020 1115   MCHC 33.5 02/21/2020 1115   RDW 14.2 02/21/2020 1115   LYMPHSABS 2,200 02/21/2020 1115   MONOABS 0.6 02/22/2019 1153   EOSABS 467 02/21/2020 1115   BASOSABS 29 02/21/2020 1115     Chest Imaging:  03/16/2020: CT scan of the chest Multiple bilateral solid and subsolid nodules with a left lower lobe cavitary lesion. Concerning for multiple areas of carcinoma. The patient's images have been independently reviewed by me.    Pulmonary Functions Testing Results: No flowsheet data found.  FeNO:   Pathology:   Echocardiogram:   Heart Catheterization:     Assessment & Plan:     ICD-10-CM   1. Mass of right lung  R91.8   2. Cavitating mass of lower lobe of left lung  J98.4   3. Mediastinal adenopathy  R59.0   4. Multiple lung nodules  R91.8   5. Former smoker  Z87.891     Discussion:  This is a 64 year old gentleman, former smoker abnormal lung cancer screening CT.  He has multifocal disease within bilateral lungs all with various morphologies.  He has upper lobe groundglass semisolid nodule with additional right lower lobe and middle lobe nodules 1 more solid spiculated and a more groundglass appearance of an additional 1.  He has a left lower lobe cavitary lesion that is concerning for malignancy as well.  Each of these could represent different pathologies.  I believe that there is a chance that he has 2 separate malignancies occurring.  We discussed the importance of biopsying tissue sampling of this today in the office.  Plan: We will plan for a navigational bronchoscopy with multisite target intervention on Friday morning. We discussed the risk benefits and alternatives of proceeding with this. We  discussed the risk to include bleeding and pneumothorax. This is an outpatient procedure we discussed the hopes with no need for keeping in the hospital for observation but would reserve this if needed depending upon any issues postoperatively. This  risk was also discussed with the patient and his wife present today in the office. Patient is agreeable to proceed at this time.  Patient will time spent today in the office reviewing CT imaging with the patient and patient's family as well as planning of the navigational targets.  Additional time spent placing preoperative orders and ensuring coordination with PCC's.  Also nuclear medicine PET scan imaging has been ordered and scheduled for the 18th.   Current Outpatient Medications:  .  amLODipine-benazepril (LOTREL) 10-20 MG capsule, Take 1 capsule by mouth daily. Annual appt due in Sept must see provider for future refills, Disp: 30 capsule, Rfl: 2 .  aspirin EC 81 MG tablet, Take 81 mg by mouth daily. Swallow whole., Disp: , Rfl:  .  b complex vitamins tablet, Take 1 tablet by mouth daily., Disp: , Rfl:  .  Garlic 1610 MG CAPS, Take 1,000 mg by mouth daily. , Disp: , Rfl:  .  Omega-3 Fatty Acids (FISH OIL PO), Take 1 capsule by mouth daily. , Disp: , Rfl:  .  pravastatin (PRAVACHOL) 40 MG tablet, Take 1 tablet (40 mg total) by mouth daily. Annual appt due in Sept must see provider for future refills, Disp: 30 tablet, Rfl: 2 .  vardenafil (LEVITRA) 20 MG tablet, TAKE 1 TABLET BY MOUTH AS NEEDED FOR  ERECTILE  DYSFUNCTION, Disp: 10 tablet, Rfl: 5 .  Vitamin D, Ergocalciferol, (DRISDOL) 1.25 MG (50000 UNIT) CAPS capsule, Take 1 capsule (50,000 Units total) by mouth every 7 (seven) days. (Patient taking differently: Take 50,000 Units by mouth every Sunday. ), Disp: 12 capsule, Rfl: 0  I spent 61 minutes dedicated to the care of this patient on the date of this encounter to include pre-visit review of records, face-to-face time with the patient discussing conditions above, post visit ordering of testing, clinical documentation with the electronic health record, making appropriate referrals as documented, and communicating necessary findings to members of the patients care team.   Garner Nash,  DO [facility] 03/28/2020 10:27 AM

## 2020-03-27 NOTE — Patient Instructions (Addendum)
Thank you for visiting Dr. Valeta Harms at [facility]. Today we recommend the following:.  Planned bronchoscopy this Friday show up at the hospital at 530AM.  Please call with any questions.   Return in about 5 weeks (around 05/02/2020) for With Dr. Valeta Harms .    Please do your part to reduce the spread of COVID-19.

## 2020-03-27 NOTE — Progress Notes (Signed)
Dr. Valeta Harms has called these results to the patient. He is scheduled for a follow up CT scan, an appointment with Dr. Valeta Harms, and a bronch this week for tissue sampling.

## 2020-03-27 NOTE — Telephone Encounter (Signed)
Thanks so much everyone for helping take care of this patientand getting him scheduled for imaging and to be seen. This team rocks!!

## 2020-03-28 ENCOUNTER — Other Ambulatory Visit: Payer: Self-pay

## 2020-03-28 ENCOUNTER — Ambulatory Visit (INDEPENDENT_AMBULATORY_CARE_PROVIDER_SITE_OTHER): Payer: 59 | Admitting: Pulmonary Disease

## 2020-03-28 ENCOUNTER — Encounter: Payer: Self-pay | Admitting: Pulmonary Disease

## 2020-03-28 ENCOUNTER — Telehealth: Payer: Self-pay | Admitting: Pulmonary Disease

## 2020-03-28 ENCOUNTER — Encounter (HOSPITAL_COMMUNITY): Payer: Self-pay | Admitting: Pulmonary Disease

## 2020-03-28 VITALS — BP 118/60 | HR 86 | Temp 97.4°F | Ht 72.0 in | Wt 143.4 lb

## 2020-03-28 DIAGNOSIS — J984 Other disorders of lung: Secondary | ICD-10-CM | POA: Diagnosis not present

## 2020-03-28 DIAGNOSIS — Z87891 Personal history of nicotine dependence: Secondary | ICD-10-CM | POA: Diagnosis not present

## 2020-03-28 DIAGNOSIS — R918 Other nonspecific abnormal finding of lung field: Secondary | ICD-10-CM | POA: Diagnosis not present

## 2020-03-28 DIAGNOSIS — R590 Localized enlarged lymph nodes: Secondary | ICD-10-CM

## 2020-03-28 NOTE — Progress Notes (Signed)
Pre-op call complete. Patient instructed to arrive to [facility] stay admitting 03/30/20 @ 0530. Medications reviewed for DOS. Patient had covid test 03/27/20 and results are negative. Patient reminded to continue quarantine until DOS.

## 2020-03-29 ENCOUNTER — Encounter (HOSPITAL_COMMUNITY): Payer: Self-pay | Admitting: Pulmonary Disease

## 2020-03-29 NOTE — Telephone Encounter (Signed)
Nicolas Allen pending Joellen Jersey

## 2020-03-30 ENCOUNTER — Ambulatory Visit (HOSPITAL_COMMUNITY): Payer: 59 | Admitting: Certified Registered Nurse Anesthetist

## 2020-03-30 ENCOUNTER — Ambulatory Visit (HOSPITAL_COMMUNITY)
Admission: RE | Admit: 2020-03-30 | Discharge: 2020-03-30 | Disposition: A | Discharge status: Home / Self Care | Payer: 59 | Attending: Pulmonary Disease | Admitting: Pulmonary Disease

## 2020-03-30 ENCOUNTER — Other Ambulatory Visit: Payer: Self-pay

## 2020-03-30 ENCOUNTER — Encounter (HOSPITAL_COMMUNITY)
Admission: RE | Disposition: A | Discharge status: Home / Self Care | Payer: Self-pay | Source: Home / Self Care | Attending: Pulmonary Disease

## 2020-03-30 ENCOUNTER — Encounter (HOSPITAL_COMMUNITY): Payer: Self-pay | Admitting: Pulmonary Disease

## 2020-03-30 ENCOUNTER — Ambulatory Visit (HOSPITAL_COMMUNITY): Payer: 59

## 2020-03-30 ENCOUNTER — Telehealth: Payer: Self-pay | Admitting: Pulmonary Disease

## 2020-03-30 DIAGNOSIS — Z79899 Other long term (current) drug therapy: Secondary | ICD-10-CM | POA: Diagnosis not present

## 2020-03-30 DIAGNOSIS — R911 Solitary pulmonary nodule: Secondary | ICD-10-CM | POA: Diagnosis not present

## 2020-03-30 DIAGNOSIS — Z9889 Other specified postprocedural states: Secondary | ICD-10-CM

## 2020-03-30 DIAGNOSIS — C3411 Malignant neoplasm of upper lobe, right bronchus or lung: Secondary | ICD-10-CM | POA: Diagnosis not present

## 2020-03-30 DIAGNOSIS — C7931 Secondary malignant neoplasm of brain: Secondary | ICD-10-CM

## 2020-03-30 DIAGNOSIS — C3432 Malignant neoplasm of lower lobe, left bronchus or lung: Secondary | ICD-10-CM | POA: Insufficient documentation

## 2020-03-30 DIAGNOSIS — N529 Male erectile dysfunction, unspecified: Secondary | ICD-10-CM | POA: Diagnosis not present

## 2020-03-30 DIAGNOSIS — Z7982 Long term (current) use of aspirin: Secondary | ICD-10-CM | POA: Diagnosis not present

## 2020-03-30 DIAGNOSIS — I10 Essential (primary) hypertension: Secondary | ICD-10-CM | POA: Insufficient documentation

## 2020-03-30 DIAGNOSIS — C3490 Malignant neoplasm of unspecified part of unspecified bronchus or lung: Secondary | ICD-10-CM

## 2020-03-30 DIAGNOSIS — Z87891 Personal history of nicotine dependence: Secondary | ICD-10-CM | POA: Insufficient documentation

## 2020-03-30 DIAGNOSIS — E785 Hyperlipidemia, unspecified: Secondary | ICD-10-CM | POA: Insufficient documentation

## 2020-03-30 DIAGNOSIS — C801 Malignant (primary) neoplasm, unspecified: Secondary | ICD-10-CM

## 2020-03-30 DIAGNOSIS — Z981 Arthrodesis status: Secondary | ICD-10-CM | POA: Diagnosis not present

## 2020-03-30 DIAGNOSIS — R918 Other nonspecific abnormal finding of lung field: Secondary | ICD-10-CM

## 2020-03-30 DIAGNOSIS — C771 Secondary and unspecified malignant neoplasm of intrathoracic lymph nodes: Secondary | ICD-10-CM | POA: Insufficient documentation

## 2020-03-30 DIAGNOSIS — C3431 Malignant neoplasm of lower lobe, right bronchus or lung: Secondary | ICD-10-CM | POA: Insufficient documentation

## 2020-03-30 HISTORY — PX: BRONCHIAL BRUSHINGS: SHX5108

## 2020-03-30 HISTORY — DX: Malignant (primary) neoplasm, unspecified: C80.1

## 2020-03-30 HISTORY — PX: BRONCHIAL NEEDLE ASPIRATION BIOPSY: SHX5106

## 2020-03-30 HISTORY — PX: VIDEO BRONCHOSCOPY WITH ENDOBRONCHIAL ULTRASOUND: SHX6177

## 2020-03-30 HISTORY — PX: BRONCHIAL BIOPSY: SHX5109

## 2020-03-30 HISTORY — PX: BRONCHIAL WASHINGS: SHX5105

## 2020-03-30 HISTORY — PX: VIDEO BRONCHOSCOPY WITH ENDOBRONCHIAL NAVIGATION: SHX6175

## 2020-03-30 LAB — COMPREHENSIVE METABOLIC PANEL
ALT: 23 U/L (ref 0–44)
AST: 29 U/L (ref 15–41)
Albumin: 3.7 g/dL (ref 3.5–5.0)
Alkaline Phosphatase: 43 U/L (ref 38–126)
Anion gap: 10 (ref 5–15)
BUN: 11 mg/dL (ref 8–23)
CO2: 25 mmol/L (ref 22–32)
Calcium: 9.2 mg/dL (ref 8.9–10.3)
Chloride: 104 mmol/L (ref 98–111)
Creatinine, Ser: 0.92 mg/dL (ref 0.61–1.24)
GFR, Estimated: >60 mL/min (ref >60)
Glucose, Bld: 91 mg/dL (ref 70–99)
Potassium: 4.2 mmol/L (ref 3.5–5.1)
Sodium: 139 mmol/L (ref 135–145)
Total Bilirubin: 0.7 mg/dL (ref 0.3–1.2)
Total Protein: 6.9 g/dL (ref 6.5–8.1)

## 2020-03-30 LAB — CBC
HCT: 41.1 % (ref 39.0–52.0)
Hemoglobin: 13.3 g/dL (ref 13.0–17.0)
MCH: 27.1 pg (ref 26.0–34.0)
MCHC: 32.4 g/dL (ref 30.0–36.0)
MCV: 83.7 fL (ref 80.0–100.0)
Platelets: 151 10*3/uL (ref 150–400)
RBC: 4.91 MIL/uL (ref 4.22–5.81)
RDW: 14.6 % (ref 11.5–15.5)
WBC: 8.1 10*3/uL (ref 4.0–10.5)
nRBC: 0 % (ref 0.0–0.2)

## 2020-03-30 LAB — APTT: aPTT: 29 seconds (ref 24–36)

## 2020-03-30 LAB — PROTIME-INR
INR: 1 (ref 0.8–1.2)
Prothrombin Time: 12.3 seconds (ref 11.4–15.2)

## 2020-03-30 SURGERY — VIDEO BRONCHOSCOPY WITH ENDOBRONCHIAL NAVIGATION
Anesthesia: General

## 2020-03-30 MED ORDER — MIDAZOLAM HCL 5 MG/5ML IJ SOLN
INTRAMUSCULAR | Status: DC | PRN
  Administered 2020-03-30: 2 mg via INTRAVENOUS

## 2020-03-30 MED ORDER — LACTATED RINGERS IV SOLN: INTRAVENOUS | Status: DC

## 2020-03-30 MED ORDER — FENTANYL CITRATE (PF) 100 MCG/2ML IJ SOLN
INTRAMUSCULAR | Status: DC | PRN
Start: 2020-03-30 — End: 2020-03-30
  Administered 2020-03-30: 50 ug via INTRAVENOUS

## 2020-03-30 MED ORDER — ROCURONIUM BROMIDE 10 MG/ML (PF) SYRINGE
PREFILLED_SYRINGE | INTRAVENOUS | Status: DC | PRN
  Administered 2020-03-30: 50 mg via INTRAVENOUS

## 2020-03-30 MED ORDER — LIDOCAINE 20MG/ML (2%) 15 ML SYRINGE OPTIME
INTRAMUSCULAR | Status: DC | PRN
  Administered 2020-03-30: 80 mg via INTRAVENOUS

## 2020-03-30 MED ORDER — PROPOFOL 10 MG/ML IV BOLUS
INTRAVENOUS | Status: DC | PRN
  Administered 2020-03-30: 150 mg via INTRAVENOUS

## 2020-03-30 MED ORDER — SUGAMMADEX SODIUM 200 MG/2ML IV SOLN
INTRAVENOUS | Status: DC | PRN
  Administered 2020-03-30: 200 mg via INTRAVENOUS

## 2020-03-30 MED ORDER — PHENYLEPHRINE HCL-NACL 10-0.9 MG/250ML-% IV SOLN
INTRAVENOUS | Status: DC | PRN
  Administered 2020-03-30: 25 ug/min via INTRAVENOUS

## 2020-03-30 MED ORDER — CHLORHEXIDINE GLUCONATE 0.12 % MT SOLN
OROMUCOSAL | Status: AC
  Administered 2020-03-30: 15 mL
  Filled 2020-03-30: qty 15

## 2020-03-30 MED ORDER — ONDANSETRON HCL 4 MG/2ML IJ SOLN
INTRAMUSCULAR | Status: DC | PRN
  Administered 2020-03-30: 4 mg via INTRAVENOUS

## 2020-03-30 SURGICAL SUPPLY — 53 items
ADAPTER BRONCH F/PENTAX (ADAPTER) ×3 IMPLANT
ADAPTER VALVE BIOPSY EBUS (MISCELLANEOUS) IMPLANT
ADPR BSCP EDG PNTX (ADAPTER) ×2
ADPTR VALVE BIOPSY EBUS (MISCELLANEOUS)
BRUSH CYTOL CELLEBRITY 1.5X140 (MISCELLANEOUS) ×3 IMPLANT
BRUSH SUPERTRAX BIOPSY (INSTRUMENTS) IMPLANT
BRUSH SUPERTRAX NDL-TIP CYTO (INSTRUMENTS) ×3 IMPLANT
CANISTER SUCT 3000ML PPV (MISCELLANEOUS) ×3 IMPLANT
CHANNEL WORK EXTEND EDGE 180 (KITS) IMPLANT
CHANNEL WORK EXTEND EDGE 45 (KITS) IMPLANT
CHANNEL WORK EXTEND EDGE 90 (KITS) IMPLANT
CONT SPEC 4OZ CLIKSEAL STRL BL (MISCELLANEOUS) ×3 IMPLANT
COVER BACK TABLE 60X90IN (DRAPES) ×3 IMPLANT
COVER DOME SNAP 22 D (MISCELLANEOUS) ×3 IMPLANT
FILTER STRAW FLUID ASPIR (MISCELLANEOUS) IMPLANT
FORCEPS BIOP RJ4 1.8 (CUTTING FORCEPS) IMPLANT
FORCEPS BIOP SUPERTRX PREMAR (INSTRUMENTS) ×3 IMPLANT
GAUZE SPONGE 4X4 12PLY STRL (GAUZE/BANDAGES/DRESSINGS) ×3 IMPLANT
GLOVE BIO SURGEON STRL SZ7.5 (GLOVE) ×3 IMPLANT
GLOVE SURG SS PI 7.5 STRL IVOR (GLOVE) ×6 IMPLANT
GOWN STRL REUS W/ TWL LRG LVL3 (GOWN DISPOSABLE) ×4 IMPLANT
GOWN STRL REUS W/TWL LRG LVL3 (GOWN DISPOSABLE) ×6
KIT CLEAN ENDO COMPLIANCE (KITS) ×6 IMPLANT
KIT LOCATABLE GUIDE (CANNULA) IMPLANT
KIT MARKER FIDUCIAL DELIVERY (KITS) IMPLANT
KIT PROCEDURE EDGE 180 (KITS) IMPLANT
KIT PROCEDURE EDGE 45 (KITS) IMPLANT
KIT PROCEDURE EDGE 90 (KITS) IMPLANT
KIT TURNOVER KIT B (KITS) ×3 IMPLANT
MARKER SKIN DUAL TIP RULER LAB (MISCELLANEOUS) ×3 IMPLANT
NDL EBUS SONO TIP PENTAX (NEEDLE) ×2 IMPLANT
NDL SUPERTRX PREMARK BIOPSY (NEEDLE) ×2 IMPLANT
NEEDLE EBUS SONO TIP PENTAX (NEEDLE) ×3 IMPLANT
NEEDLE SUPERTRX PREMARK BIOPSY (NEEDLE) ×3 IMPLANT
NS IRRIG 1000ML POUR BTL (IV SOLUTION) ×3 IMPLANT
OIL SILICONE PENTAX (PARTS (SERVICE/REPAIRS)) ×3 IMPLANT
PAD ARMBOARD 7.5X6 YLW CONV (MISCELLANEOUS) ×6 IMPLANT
PATCHES PATIENT (LABEL) ×9 IMPLANT
SOL ANTI FOG 6CC (MISCELLANEOUS) ×2 IMPLANT
SOLUTION ANTI FOG 6CC (MISCELLANEOUS) ×1
SYR 20CC LL (SYRINGE) ×6 IMPLANT
SYR 20ML ECCENTRIC (SYRINGE) ×6 IMPLANT
SYR 50ML SLIP (SYRINGE) ×3 IMPLANT
SYR 5ML LUER SLIP (SYRINGE) ×3 IMPLANT
TOWEL OR 17X24 6PK STRL BLUE (TOWEL DISPOSABLE) ×3 IMPLANT
TRAP SPECIMEN MUCOUS 40CC (MISCELLANEOUS) IMPLANT
TUBE CONNECTING 20X1/4 (TUBING) ×6 IMPLANT
UNDERPAD 30X30 (UNDERPADS AND DIAPERS) ×3 IMPLANT
VALVE BIOPSY  SINGLE USE (MISCELLANEOUS) ×3
VALVE BIOPSY SINGLE USE (MISCELLANEOUS) ×2 IMPLANT
VALVE DISPOSABLE (MISCELLANEOUS) ×3 IMPLANT
VALVE SUCTION BRONCHIO DISP (MISCELLANEOUS) ×3 IMPLANT
WATER STERILE IRR 1000ML POUR (IV SOLUTION) ×3 IMPLANT

## 2020-03-30 NOTE — Telephone Encounter (Signed)
Pt is still showing as currently admitted - I will call this afternoon to schedule.

## 2020-03-30 NOTE — Interval H&P Note (Signed)
History and Physical Interval Note:  03/30/2020 7:27 AM  Nicolas Allen  has presented today for surgery, with the diagnosis of LUNG MASS.  The various methods of treatment have been discussed with the patient and family. After consideration of risks, benefits and other options for treatment, the patient has consented to  Procedure(s): VIDEO BRONCHOSCOPY WITH ENDOBRONCHIAL NAVIGATION (N/A) VIDEO BRONCHOSCOPY WITH ENDOBRONCHIAL ULTRASOUND (N/A) as a surgical intervention.  The patient's history has been reviewed, patient examined, no change in status, stable for surgery.  I have reviewed the patient's chart and labs.  Questions were answered to the patient's satisfaction.    We discussed risks and benefits. Patient is agreeable to proceed.   [place]

## 2020-03-30 NOTE — Transfer of Care (Signed)
Immediate Anesthesia Transfer of Care Note  Patient: Nicolas Allen  Procedure(s) Performed: VIDEO BRONCHOSCOPY WITH ENDOBRONCHIAL NAVIGATION (N/A ) VIDEO BRONCHOSCOPY WITH ENDOBRONCHIAL ULTRASOUND (N/A ) BRONCHIAL BRUSHINGS BRONCHIAL BIOPSIES BRONCHIAL NEEDLE ASPIRATION BIOPSIES BRONCHIAL WASHINGS  Patient Location: PACU and Endoscopy Unit  Anesthesia Type:General  Level of Consciousness: awake and patient cooperative  Airway & Oxygen Therapy: Patient Spontanous Breathing and Patient connected to face mask oxygen  Post-op Assessment: Report given to RN and Post -op Vital signs reviewed and stable  Post vital signs: Reviewed and stable  Last Vitals:  Vitals Value Taken Time  BP 131/63 03/30/20 0958  Temp 36.6 C 03/30/20 0950  Pulse 81 03/30/20 1006  Resp 19 03/30/20 1006  SpO2 100 % 03/30/20 1006  Vitals shown include unvalidated device data.  Last Pain:  Vitals:   03/30/20 0950  TempSrc: Oral  PainSc: 0-No pain      Patients Stated Pain Goal: 3 (15/87/27 6184)  Complications: No complications documented.

## 2020-03-30 NOTE — Anesthesia Preprocedure Evaluation (Addendum)
Anesthesia Evaluation  Patient identified by MRN, date of birth, ID band Patient awake    Reviewed: Allergy & Precautions, NPO status , Patient's Chart, lab work & pertinent test results  Airway Mallampati: I  TM Distance: >3 FB Neck ROM: Full    Dental  (+) Edentulous Upper, Missing, Poor Dentition, Dental Advisory Given   Pulmonary former smoker,    breath sounds clear to auscultation (-) decreased breath sounds      Cardiovascular hypertension,  Rhythm:Regular Rate:Normal     Neuro/Psych Anxiety negative neurological ROS     GI/Hepatic Neg liver ROS, GERD  ,  Endo/Other  negative endocrine ROS  Renal/GU negative Renal ROS     Musculoskeletal negative musculoskeletal ROS (+)   Abdominal Normal abdominal exam  (+)   Peds  Hematology negative hematology ROS (+)   Anesthesia Other Findings - HLD  Reproductive/Obstetrics                            Anesthesia Physical Anesthesia Plan  ASA: II  Anesthesia Plan: General   Post-op Pain Management:    Induction: Intravenous  PONV Risk Score and Plan: 2 and Ondansetron and Midazolam  Airway Management Planned: Oral ETT  Additional Equipment: None  Intra-op Plan:   Post-operative Plan: Extubation in OR  Informed Consent: I have reviewed the patients History and Physical, chart, labs and discussed the procedure including the risks, benefits and alternatives for the proposed anesthesia with the patient or authorized representative who has indicated his/her understanding and acceptance.       Plan Discussed with: CRNA  Anesthesia Plan Comments:        Anesthesia Quick Evaluation

## 2020-03-30 NOTE — Telephone Encounter (Signed)
PCCM:  Orders placed for MRI brain  Please schedule next available.  PET has already been scheduled  Appt with Dr. Julien Nordmann pending   Thanks  Garner Nash, DO [facility] 03/30/2020 10:11 AM

## 2020-03-30 NOTE — Anesthesia Postprocedure Evaluation (Signed)
Anesthesia Post Note  Patient: Nicolas Allen  Procedure(s) Performed: VIDEO BRONCHOSCOPY WITH ENDOBRONCHIAL NAVIGATION (N/A ) VIDEO BRONCHOSCOPY WITH ENDOBRONCHIAL ULTRASOUND (N/A ) BRONCHIAL BRUSHINGS BRONCHIAL BIOPSIES BRONCHIAL NEEDLE ASPIRATION BIOPSIES BRONCHIAL WASHINGS     Patient location during evaluation: PACU Anesthesia Type: General Level of consciousness: awake and alert Pain management: pain level controlled Vital Signs Assessment: post-procedure vital signs reviewed and stable Respiratory status: spontaneous breathing, nonlabored ventilation, respiratory function stable and patient connected to nasal cannula oxygen Cardiovascular status: blood pressure returned to baseline and stable Postop Assessment: no apparent nausea or vomiting Anesthetic complications: no   No complications documented.  Last Vitals:  Vitals:   03/30/20 1030 03/30/20 1040  BP: (!) 129/58 127/60  Pulse: 78 79  Resp: 16 15  Temp:    SpO2: 100% 100%    Last Pain:  Vitals:   03/30/20 1040  TempSrc:   PainSc: 0-No pain                 Effie Berkshire

## 2020-03-30 NOTE — Telephone Encounter (Signed)
Nicolas Allen [id number] has been approved Nicolas Allen

## 2020-03-30 NOTE — Anesthesia Procedure Notes (Signed)
Procedure Name: Intubation Date/Time: 03/30/2020 7:34 AM Performed by: Lowella Dell, CRNA Pre-anesthesia Checklist: Patient identified, Emergency Drugs available, Suction available and Patient being monitored Patient Re-evaluated:Patient Re-evaluated prior to induction Oxygen Delivery Method: Circle System Utilized Preoxygenation: Pre-oxygenation with 100% oxygen Induction Type: IV induction Ventilation: Mask ventilation without difficulty Laryngoscope Size: Mac and 4 Tube type: Oral Tube size: 8.0 mm Number of attempts: 1 Airway Equipment and Method: Stylet Placement Confirmation: ETT inserted through vocal cords under direct vision,  positive ETCO2 and breath sounds checked- equal and bilateral Secured at: 24 cm Tube secured with: Tape Dental Injury: Teeth and Oropharynx as per pre-operative assessment

## 2020-03-30 NOTE — Op Note (Signed)
Video Bronchoscopy with Electromagnetic Navigation, to multiple targets, right upper lobe right lower lobe, left lower lobe, endobronchial ultrasound with transbronchial needle aspirations procedure Note  Date of Operation: 03/30/2020  Pre-op Diagnosis: Multiple lung nodules, lung masses  Post-op Diagnosis: Multiple lung nodules, lung masses  Surgeon: Garner Nash, DO   Assistants: None   Anesthesia: General endotracheal anesthesia  Operation: Flexible video fiberoptic bronchoscopy with electromagnetic navigation and biopsies.  Estimated Blood Loss: Minimal  Complications: None   Indications and History: Nicolas Allen is a 64 y.o. male with multiple lung nodules, lung masses concerning for multifocal disease.,  Potential multiple metachronous lesions.  The risks, benefits, complications, treatment options and expected outcomes were discussed with the patient.  The possibilities of pneumothorax, pneumonia, reaction to medication, pulmonary aspiration, perforation of a viscus, bleeding, failure to diagnose a condition and creating a complication requiring transfusion or operation were discussed with the patient who freely signed the consent.    Description of Procedure: The patient was seen in the Preoperative Area, was examined and was deemed appropriate to proceed.  The patient was taken to [facility] endoscopy room 2, identified as Nicolas Allen and the procedure verified as Flexible Video Fiberoptic Bronchoscopy.  A Time Out was held and the above information confirmed.   Prior to the date of the procedure a high-resolution CT scan of the chest was performed. Utilizing [place] a virtual tracheobronchial tree was generated to allow the creation of distinct navigation pathways to the patient's parenchymal abnormalities. After being taken to the operating room general anesthesia was initiated and the patient  was orally intubated. The video fiberoptic bronchoscope was  introduced via the endotracheal tube and a general inspection was performed which showed normal right and left lung anatomy with no evidence of endobronchial lesion.   Target 1 right upper lobe posterior segment: The extendable working channel and locator guide were introduced into the bronchoscope. The distinct navigation pathways prepared prior to this procedure were then utilized to navigate to within 0.8cm of patient's lesion(s) identified on CT scan.  A full fluoroscopic sweep was completed for local registration to include an inspiratory breath-hold APL 20, from RAO 25 degrees to LAO 25 degrees. The extendable working channel was secured into place and the locator guide was withdrawn. Under fluoroscopic guidance transbronchial needle brushings, transbronchial Wang needle biopsies, and transbronchial forceps biopsies were performed to be sent for cytology and pathology. A bronchioalveolar lavage was performed in the right upper lobe posterior segment and sent for cytology.  Target 2 right upper lobe anterior segment: The extendable working channel and locator guide were introduced into the bronchoscope. The distinct navigation pathways prepared prior to this procedure were then utilized to navigate to within 0.8 cm of patient's lesion(s) identified on CT scan.  A full fluoroscopic sweep was completed for local registration to include an inspiratory breath-hold APL 20, from RAO 25 degrees to LAO 25 degrees.  The extendable working channel was secured into place and the locator guide was withdrawn. Under fluoroscopic guidance transbronchial needle brushings, transbronchial Wang needle biopsies, and transbronchial forceps biopsies were performed to be sent for cytology and pathology. A bronchioalveolar lavage was performed in the right upper lobe anterior segment and sent for cytology and microbiology (bacterial, fungal, AFB smears and cultures).  Target 3 right lower lobe: The extendable working channel  and locator guide were introduced into the bronchoscope. The distinct navigation pathways prepared prior to this procedure were then utilized to navigate to within 0.6  cm of patient's lesion(s) identified on CT scan.  A full fluoroscopic sweep was completed for local registration to include an inspiratory breath-hold APL 20, from RAO 25 degrees to LAO 25 degrees. The extendable working channel was secured into place and the locator guide was withdrawn. Under fluoroscopic guidance transbronchial needle brushings, transbronchial Wang needle biopsies, and transbronchial forceps biopsies were performed to be sent for cytology and pathology. A bronchioalveolar lavage was performed in the 0.5cm and sent for cytology  Target 4 left lower lobe: The extendable working channel and locator guide were introduced into the bronchoscope. The distinct navigation pathways prepared prior to this procedure were then utilized to navigate to within 0.5 cm of patient's lesion(s) identified on CT scan.  A full fluoroscopic sweep was completed for local registration to include an inspiratory breath-hold APL 20, from RAO 25 degrees to LAO 25 degrees.  The extendable working channel was secured into place and the locator guide was withdrawn. Under fluoroscopic guidance transbronchial needle brushings, transbronchial Wang needle biopsies, and transbronchial forceps biopsies were performed to be sent for cytology and pathology. A bronchioalveolar lavage was performed in the left lower lobe and sent for cytology.  EBUS Station 7: The standard scope was then withdrawn and the endobronchial ultrasound was used to identify and characterize the peritracheal, hilar and bronchial lymph nodes. Inspection showed enlarged station 7 approximately 1.5-1.7 cm in largest cross-section. Using real-time ultrasound guidance Wang needle biopsies were take from Station 7 node and were sent for cytology. The patient tolerated the procedure well without  apparent complications. There was no significant blood loss. The bronchoscope was withdrawn.   At the end of the procedure a general airway inspection was performed and there was no evidence of active bleeding.  The therapeutic bronchoscope was used for suctioning and clearance and aspiration of the bilateral mainstem's for clearance of secretions and blood clots remaining within the airway.  Bronchoscope was brought to just above the main carina there was no evidence of active bleeding.  The bronchoscope was removed.  The patient tolerated the procedure well. There was no significant blood loss and there were no obvious complications. A post-procedural chest x-ray is pending.  Target 1 Samples: 1. Transbronchial needle brushings from right upper lobe posterior segment 2. Transbronchial Wang needle biopsies from right upper lobe posterior segment 3. Transbronchial forceps biopsies from right upper lobe posterior segment 4. Bronchoalveolar lavage from right upper lobe posterior segment  Target 2 Samples: 1. Transbronchial needle brushings from right upper lobe anterior segment 2. Transbronchial Wang needle biopsies from right upper lobe anterior segment 3. Transbronchial forceps biopsies from right upper lobe anterior segment 4. Bronchoalveolar lavage from right upper lobe anterior segment  Target 3 Samples: 1. Transbronchial needle brushings from right lower lobe 2. Transbronchial Wang needle biopsies from right lower lobe 3. Transbronchial forceps biopsies from right lower lobe 4. Bronchoalveolar lavage from right lower lobe  Target 4 Samples: 1. Transbronchial needle brushings from left lower lobe 2. Transbronchial Wang needle biopsies from left lower lobe 3. Transbronchial forceps biopsies from left lower lobe 4. Bronchoalveolar lavage from left lower lobe  EBUS Samples: 1. Wang needle biopsies from station 7 node  Plans:  The patient will be discharged from the PACU to home when  recovered from anesthesia and after chest x-ray is reviewed. We will review the cytology, pathology and microbiology results with the patient when they become available. Outpatient followup will be with Garner Nash, DO  Preliminary pathology: Consistent with multifocal  agents, adequate for tissue diagnosis.  Garner Nash, DO [facility] 03/30/2020 9:59 AM

## 2020-03-30 NOTE — Discharge Instructions (Signed)
Flexible Bronchoscopy, Care After This sheet gives you information about how to care for yourself after your test. Your doctor may also give you more specific instructions. If you have problems or questions, contact your doctor. Follow these instructions at home: Eating and drinking  The day after the test, go back to your normal diet. Driving  Do not drive for 24 hours if you were given a medicine to help you relax (sedative).  Do not drive or use heavy machinery while taking prescription pain medicine. General instructions   Take over-the-counter and prescription medicines only as told by your doctor.  Return to your normal activities as told. Ask what activities are safe for you.  Do not use any products that have nicotine or tobacco in them. This includes cigarettes and e-cigarettes. If you need help quitting, ask your doctor.  Keep all follow-up visits as told by your doctor. This is important. It is very important if you had a tissue sample (biopsy) taken. Get help right away if:  You have shortness of breath that gets worse.  You get light-headed.  You feel like you are going to pass out (faint).  You have chest pain.  You cough up: ? More than a little blood. ? More blood than before. Summary  Do not eat or drink anything (not even water) for 2 hours after your test, or until your numbing medicine wears off.  Do not use cigarettes. Do not use e-cigarettes.  Get help right away if you have chest pain. This information is not intended to replace advice given to you by your health care provider. Make sure you discuss any questions you have with your health care provider. Document Revised: 05/15/2017 Document Reviewed: 06/20/2016 Elsevier Patient Education  2020 [employer].

## 2020-03-30 NOTE — Telephone Encounter (Signed)
MRI of brain has been scheduled for 10/23.  I have left pt a vm to call me back for appt info.

## 2020-03-31 LAB — ACID FAST SMEAR (AFB, MYCOBACTERIA): Acid Fast Smear: NEGATIVE

## 2020-04-01 LAB — CULTURE, RESPIRATORY W GRAM STAIN: Culture: NO GROWTH

## 2020-04-02 ENCOUNTER — Encounter: Payer: Self-pay | Admitting: *Deleted

## 2020-04-02 ENCOUNTER — Telehealth: Payer: Self-pay | Admitting: Pulmonary Disease

## 2020-04-02 ENCOUNTER — Other Ambulatory Visit: Payer: Self-pay

## 2020-04-02 ENCOUNTER — Encounter (HOSPITAL_COMMUNITY): Payer: Self-pay | Admitting: Pulmonary Disease

## 2020-04-02 ENCOUNTER — Telehealth: Payer: Self-pay | Admitting: Internal Medicine

## 2020-04-02 ENCOUNTER — Ambulatory Visit (HOSPITAL_COMMUNITY)
Admission: RE | Admit: 2020-04-02 | Discharge: 2020-04-02 | Disposition: A | Discharge status: Home / Self Care | Payer: 59 | Source: Ambulatory Visit | Attending: Pulmonary Disease | Admitting: Pulmonary Disease

## 2020-04-02 DIAGNOSIS — R918 Other nonspecific abnormal finding of lung field: Secondary | ICD-10-CM

## 2020-04-02 LAB — GLUCOSE, CAPILLARY: Glucose-Capillary: 91 mg/dL (ref 70–99)

## 2020-04-02 MED ORDER — FLUDEOXYGLUCOSE F - 18 (FDG) INJECTION
7.3000 | Freq: Once | INTRAVENOUS | Status: AC | PRN
  Administered 2020-04-02: 7.3 via INTRAVENOUS

## 2020-04-02 NOTE — Telephone Encounter (Signed)
Patient is scheduled for MRI Brain on 04/07/2020. He stated that he has a small piece of steele in his neck from a prior surgery in 2010.  Dr. Valeta Harms, please advise. Is it okay to proceed with MRI?

## 2020-04-02 NOTE — Progress Notes (Signed)
I received referral on Mr. Nicolas Allen.  I updated Dr. Julien Nordmann. He would like to see patient 10/22 labs at 9:30 and to see him at 10:00. I notified scheduling to call and schedule.

## 2020-04-02 NOTE — Telephone Encounter (Signed)
Scheduled appointment per staff message from [facility] and new patient referral received on 10/15. Spoke to patient who is aware of appointment date and time.

## 2020-04-02 NOTE — Telephone Encounter (Signed)
Yes as far as I know. This however needs to be directed to the radiology department to confirm. They should be reviewing these questions prior with the patient. Thanks BLI

## 2020-04-03 NOTE — Telephone Encounter (Signed)
Spoke with pt. He is aware of Dr. Juline Patch response. Nothing further was needed.

## 2020-04-03 NOTE — Telephone Encounter (Signed)
LMTCB x1 for pt.  

## 2020-04-05 ENCOUNTER — Other Ambulatory Visit: Payer: Self-pay | Admitting: Internal Medicine

## 2020-04-05 NOTE — Telephone Encounter (Signed)
Please refill as per office routine med refill policy (all routine meds refilled for 3 mo or monthly per pt preference up to one year from last visit, then month to month grace period for 3 mo, then further med refills will have to be denied)  

## 2020-04-06 ENCOUNTER — Other Ambulatory Visit: Payer: Self-pay

## 2020-04-06 ENCOUNTER — Inpatient Hospital Stay (HOSPITAL_BASED_OUTPATIENT_CLINIC_OR_DEPARTMENT_OTHER): Payer: 59 | Admitting: Internal Medicine

## 2020-04-06 ENCOUNTER — Inpatient Hospital Stay: Payer: 59

## 2020-04-06 ENCOUNTER — Encounter: Payer: Self-pay | Admitting: Internal Medicine

## 2020-04-06 ENCOUNTER — Inpatient Hospital Stay: Payer: 59 | Attending: Internal Medicine

## 2020-04-06 VITALS — BP 126/63 | HR 57 | Temp 97.6°F | Resp 16 | Ht 72.0 in | Wt 140.9 lb

## 2020-04-06 DIAGNOSIS — Z87891 Personal history of nicotine dependence: Secondary | ICD-10-CM

## 2020-04-06 DIAGNOSIS — K219 Gastro-esophageal reflux disease without esophagitis: Secondary | ICD-10-CM

## 2020-04-06 DIAGNOSIS — I10 Essential (primary) hypertension: Secondary | ICD-10-CM

## 2020-04-06 DIAGNOSIS — Z808 Family history of malignant neoplasm of other organs or systems: Secondary | ICD-10-CM | POA: Insufficient documentation

## 2020-04-06 DIAGNOSIS — C3432 Malignant neoplasm of lower lobe, left bronchus or lung: Secondary | ICD-10-CM | POA: Diagnosis not present

## 2020-04-06 DIAGNOSIS — C3491 Malignant neoplasm of unspecified part of right bronchus or lung: Secondary | ICD-10-CM

## 2020-04-06 DIAGNOSIS — C3431 Malignant neoplasm of lower lobe, right bronchus or lung: Secondary | ICD-10-CM | POA: Diagnosis not present

## 2020-04-06 DIAGNOSIS — C3411 Malignant neoplasm of upper lobe, right bronchus or lung: Secondary | ICD-10-CM

## 2020-04-06 DIAGNOSIS — C778 Secondary and unspecified malignant neoplasm of lymph nodes of multiple regions: Secondary | ICD-10-CM | POA: Diagnosis not present

## 2020-04-06 DIAGNOSIS — R918 Other nonspecific abnormal finding of lung field: Secondary | ICD-10-CM

## 2020-04-06 LAB — CYTOLOGY - NON PAP

## 2020-04-06 LAB — CMP (CANCER CENTER ONLY)
ALT: 11 U/L (ref 0–44)
AST: 18 U/L (ref 15–41)
Albumin: 3.7 g/dL (ref 3.5–5.0)
Alkaline Phosphatase: 52 U/L (ref 38–126)
Anion gap: 5 (ref 5–15)
BUN: 8 mg/dL (ref 8–23)
CO2: 30 mmol/L (ref 22–32)
Calcium: 9.3 mg/dL (ref 8.9–10.3)
Chloride: 105 mmol/L (ref 98–111)
Creatinine: 0.93 mg/dL (ref 0.61–1.24)
GFR, Estimated: >60 mL/min (ref >60)
Glucose, Bld: 93 mg/dL (ref 70–99)
Potassium: 4.3 mmol/L (ref 3.5–5.1)
Sodium: 140 mmol/L (ref 135–145)
Total Bilirubin: 0.6 mg/dL (ref 0.3–1.2)
Total Protein: 6.7 g/dL (ref 6.5–8.1)

## 2020-04-06 LAB — CBC WITH DIFFERENTIAL (CANCER CENTER ONLY)
Abs Immature Granulocytes: 0.02 10*3/uL (ref 0.00–0.07)
Basophils Absolute: 0 10*3/uL (ref 0.0–0.1)
Basophils Relative: 1 %
Eosinophils Absolute: 0.5 10*3/uL (ref 0.0–0.5)
Eosinophils Relative: 8 %
HCT: 37.6 % — ABNORMAL LOW (ref 39.0–52.0)
Hemoglobin: 12.6 g/dL — ABNORMAL LOW (ref 13.0–17.0)
Immature Granulocytes: 0 %
Lymphocytes Relative: 38 %
Lymphs Abs: 2.2 10*3/uL (ref 0.7–4.0)
MCH: 27.5 pg (ref 26.0–34.0)
MCHC: 33.5 g/dL (ref 30.0–36.0)
MCV: 81.9 fL (ref 80.0–100.0)
Monocytes Absolute: 0.6 10*3/uL (ref 0.1–1.0)
Monocytes Relative: 11 %
Neutro Abs: 2.4 10*3/uL (ref 1.7–7.7)
Neutrophils Relative %: 42 %
Platelet Count: 151 10*3/uL (ref 150–400)
RBC: 4.59 MIL/uL (ref 4.22–5.81)
RDW: 14.6 % (ref 11.5–15.5)
WBC Count: 5.8 10*3/uL (ref 4.0–10.5)
nRBC: 0 % (ref 0.0–0.2)

## 2020-04-06 NOTE — Progress Notes (Signed)
[facility] [phone]   [phone]  CONSULT NOTE  REFERRING PHYSICIAN: Dr. Leory Plowman Icard  REASON FOR CONSULTATION:  64 years old African-American male recently diagnosed with lung cancer.  HPI Nicolas Allen is a 64 y.o. male with past medical history significant for hypertension, dyslipidemia, irritable bowel syndrome, GERD, cervical disc disease, and anxiety and anemia as well as long history of smoking but quit on 2020-02-22.  The patient mentioned because of his long time of smoking he underwent screening CT scan of the chest on 2020-03-19 and it showed 2.4 x 4.9 x 2.5 cm spiculated mass in the posterior right upper lobe.  The mass involves the right lateral chest wall and the right major fissure.  There was also a 2.5 x 3.8 x 7.0 cm mixed cystic/solid mass in the posterior lateral left lower lobe.  There was also 2.5 x 2.3 x 2.0 cm subsolid nodule in the right lower lobe with associated 1.8 cm solid component.  The scan also showed fullness of the right hilar region favoring right hilar lymphadenopathy in addition to 1.2 cm short axis subcarinal node.  The patient was referred to Dr. Valeta Harms and he underwent video bronchoscopy with electromagnetic navigation to multiple lesions in the right upper lobe right lower lobe left lower lobe as well as endobronchial ultrasound with transbronchial needle aspiration on 2020-03-30 under the care of Dr. Valeta Harms.  The final pathology ([id number]) from the right upper lobe, right lower lobe as well as the left lower lobe were all consistent with non-small cell lung cancer favoring adenocarcinoma but there was no enough tissue for additional immunohistochemical stains or molecular studies.  The patient had a PET scan on 2020-04-02 and it showed markedly hyperintense right lung mass with spiculated margins tethering the fissure in the posterior right upper lobe.  The left lower lobe mass displayed slightly less FDG uptake  and the right hilar and mediastinal lymphadenopathy are compatible with nodal involvement from the primary lung cancer.  There was a smaller solid and partially solid nodules with variable degrees of FDG uptake.  There was no definite disease beyond the chest and no signs of abdominal or pelvic involvement. Dr. Valeta Harms kindly referred the patient to me today for evaluation and recommendation regarding treatment of his condition. When seen today he continues to have cough but no significant shortness of breath, chest pain or hemoptysis.  He denied having any weight loss or night sweats.  He has no nausea, vomiting, diarrhea or constipation.  He denied having any headache or visual changes. Family history significant for mother with esophageal cancer.  Father had peripheral vascular disease. The patient is married and has no children.  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  He was accompanied today by his wife Nicolas Allen.  He has a history of smoking 2 pack/day for around 49 years and quit on 2020-02-22.  He also quit alcohol drinking 20 years ago and no history of drug abuse.  HPI  Past Medical History:  Diagnosis Date  . Anemia 06/24/2012  . ANXIETY 02/03/2007  . Cervical disc disease 02/12/2012   S/p surgury jan 2013  . Erectile dysfunction 02/11/2011  . GERD (gastroesophageal reflux disease)   . GLUCOSE INTOLERANCE 01/04/2008  . HYPERLIPIDEMIA 02/03/2007  . HYPERTENSION 02/03/2007  . IBS (irritable bowel syndrome)   . Impaired glucose tolerance 02/11/2011  . Smoker 08/22/2014  . Substance abuse ([place]) 2001   sober  for 16 yrs    Past Surgical History:  Procedure Laterality Date  . BRONCHIAL BIOPSY  03/30/2020   Procedure: BRONCHIAL BIOPSIES;  Surgeon: Garner Nash, DO;  Location: [facility];  Service: Pulmonary;;  . BRONCHIAL BRUSHINGS  03/30/2020   Procedure: BRONCHIAL BRUSHINGS;  Surgeon: Garner Nash, DO;  Location: [facility];  Service:  Pulmonary;;  . BRONCHIAL NEEDLE ASPIRATION BIOPSY  03/30/2020   Procedure: BRONCHIAL NEEDLE ASPIRATION BIOPSIES;  Surgeon: Garner Nash, DO;  Location: [facility];  Service: Pulmonary;;  . BRONCHIAL WASHINGS  03/30/2020   Procedure: BRONCHIAL WASHINGS;  Surgeon: Garner Nash, DO;  Location: [facility];  Service: Pulmonary;;  . COLONOSCOPY  2007  . neck fusion  2012   C4  . NO PAST SURGERIES    . VIDEO BRONCHOSCOPY WITH ENDOBRONCHIAL NAVIGATION N/A 03/30/2020   Procedure: VIDEO BRONCHOSCOPY WITH ENDOBRONCHIAL NAVIGATION;  Surgeon: Garner Nash, DO;  Location: [facility];  Service: Pulmonary;  Laterality: N/A;  . VIDEO BRONCHOSCOPY WITH ENDOBRONCHIAL ULTRASOUND N/A 03/30/2020   Procedure: VIDEO BRONCHOSCOPY WITH ENDOBRONCHIAL ULTRASOUND;  Surgeon: Garner Nash, DO;  Location: [facility];  Service: Pulmonary;  Laterality: N/A;    Family History  Problem Relation Age of Onset  . Cancer Mother        esophagus  . Cancer Cousin   . Stroke Other   . Hypertension Other   . Colon cancer Neg Hx     Social History Social History   Tobacco Use  . Smoking status: Former Smoker    Packs/day: 1.50    Years: 30.00    Pack years: 45.00    Types: Cigarettes    Quit date: 02/18/2020    Years since quitting: 0.1  . Smokeless tobacco: Former Systems developer    Types: Secondary school teacher  . Vaping Use: Never used  Substance Use Topics  . Alcohol use: No    Alcohol/week: 0.0 standard drinks    Comment: quit 2000 prior heavy  . Drug use: No    No Known Allergies  Current Outpatient Medications  Medication Sig Dispense Refill  . amLODipine-benazepril (LOTREL) 10-20 MG capsule Take 1 capsule by mouth daily. Annual appt due in Sept must see provider for future refills 30 capsule 2  . aspirin EC 81 MG tablet Take 81 mg by mouth daily. Swallow whole.    . b complex vitamins tablet Take 1 tablet by mouth daily.    . Garlic 2952 MG CAPS Take 1,000 mg by mouth daily.     . Omega-3  Fatty Acids (FISH OIL PO) Take 1 capsule by mouth daily.     . pravastatin (PRAVACHOL) 40 MG tablet Take 1 tablet (40 mg total) by mouth daily. Annual appt due in Sept must see provider for future refills 30 tablet 2  . vardenafil (LEVITRA) 20 MG tablet TAKE 1 TABLET BY MOUTH AS NEEDED FOR  ERECTILE  DYSFUNCTION 10 tablet 5  . Vitamin D, Ergocalciferol, (DRISDOL) 1.25 MG (50000 UNIT) CAPS capsule Take 1 capsule (50,000 Units total) by mouth every 7 (seven) days. (Patient taking differently: Take 50,000 Units by mouth every Sunday. ) 12 capsule 0   No current facility-administered medications for this visit.    Review of Systems  Constitutional: positive for fatigue Eyes: negative Ears, nose, mouth, throat, and face: negative Respiratory: positive for cough Cardiovascular: negative Gastrointestinal: negative Genitourinary:negative Integument/breast: negative Hematologic/lymphatic: negative Musculoskeletal:negative Neurological: negative Behavioral/Psych: negative Endocrine: negative Allergic/Immunologic: negative  Physical Exam  WUX:LKGMW, healthy, no  distress, well nourished, well developed and anxious SKIN: skin color, texture, turgor are normal, no rashes or significant lesions HEAD: Normocephalic, No masses, lesions, tenderness or abnormalities EYES: normal, PERRLA, Conjunctiva are pink and non-injected EARS: External ears normal, Canals clear OROPHARYNX:no exudate, no erythema and lips, buccal mucosa, and tongue normal  NECK: supple, no adenopathy, no JVD LYMPH:  no palpable lymphadenopathy, no hepatosplenomegaly LUNGS: clear to auscultation , and palpation HEART: regular rate & rhythm, no murmurs and no gallops ABDOMEN:abdomen soft, non-tender, normal bowel sounds and no masses or organomegaly BACK: No CVA tenderness, Range of motion is normal EXTREMITIES:no joint deformities, effusion, or inflammation, no edema  NEURO: alert & oriented x 3 with fluent speech, no focal  motor/sensory deficits  PERFORMANCE STATUS: ECOG 1  LABORATORY DATA: Lab Results  Component Value Date   WBC 5.8 04/06/2020   HGB 12.6 (L) 04/06/2020   HCT 37.6 (L) 04/06/2020   MCV 81.9 04/06/2020   PLT 151 04/06/2020      Chemistry      Component Value Date/Time   NA 139 03/30/2020 0603   K 4.2 03/30/2020 0603   CL 104 03/30/2020 0603   CO2 25 03/30/2020 0603   BUN 11 03/30/2020 0603   CREATININE 0.92 03/30/2020 0603   CREATININE 0.88 02/21/2020 1115      Component Value Date/Time   CALCIUM 9.2 03/30/2020 0603   ALKPHOS 43 03/30/2020 0603   AST 29 03/30/2020 0603   ALT 23 03/30/2020 0603   BILITOT 0.7 03/30/2020 0603       RADIOGRAPHIC STUDIES: NM PET Image Initial (PI) Skull Base To Thigh  Result Date: 04/02/2020 CLINICAL DATA:  Is initial treatment strategy for pulmonary lesions suspicious for multifocal adenocarcinoma. EXAM: NUCLEAR MEDICINE PET SKULL BASE TO THIGH TECHNIQUE: 7.3 mCi F-18 FDG was injected intravenously. Full-ring PET imaging was performed from the skull base to thigh after the radiotracer. CT data was obtained and used for attenuation correction and anatomic localization. Fasting blood glucose: 91 mg/dl COMPARISON:  CT of the chest from March 27, 2020 FINDINGS: Mediastinal blood pool activity: SUV max 1.67 Liver activity: SUV max NA NECK: Diffuse brown fat activity throughout the neck and chest. This is present in the supraclavicular region bilaterally and in the supraclavicular fossa extending posteriorly as well as associated with paraspinous uptake. No focal nodal enlargement or hypermetabolic lymph nodes in the neck. Incidental CT findings: Cervical spinal fusion with some streak artifact. CHEST: Pulmonary lesions with increased FDG uptake. RIGHT upper lobe mass (image 31, series 8 and image 74 of series 4) not changed from the CT of 3 days ago. (SUVmax = 9.7 LEFT lower lobe cystic and solid area with adjacent septal thickening, compatible with LEFT  lower lobe mass (SUVmax = 5.1) Part solid nodule in the RIGHT lower lobe (image 89 of series 4) also unchanged (SUVmax = 2.5) Part solid, mainly solid nodule just posterior to the major fissure and hilar structures in the RIGHT chest in the RIGHT lower lobe (image 33, series 8) 1.5 cm with mildly increased FDG uptake (SUVmax = 2.8) Low level metabolic activity present and smaller pulmonary nodules. Spiculated nodule in the RIGHT lung apex (image 14, series 8) SUV value less than mediastinal blood pool but suspicious based on morphologic features. Other areas of ground-glass and nodularity unchanged from recent CT imaging of the chest none with metabolic uptake exceeding the dominant areas in the RIGHT upper and LEFT lower lobes. RIGHT hilar adenopathy on image 75 of series 4 (  SUVmax = 9.2) this may represent adjacent juxta hilar lymph nodes. Subcarinal nodal tissue 13 mm short axis with intense metabolic activity (SUVmax = 10.2) Precarinal nodal tissue with increased metabolic activity of similar intensity. High RIGHT paratracheal lymph node (image 63 of series 4) 1 cm (SUVmax = 7.7) LEFT para esophageal activity without corresponding lymph node or soft tissue likely related to brown fat activity (image 64 of series 4) no LEFT hilar uptake Incidental CT findings: Calcified atheromatous plaque of the thoracic aorta similar to previous imaging. No pericardial effusion. Esophagus grossly normal. Background pulmonary emphysema ABDOMEN/PELVIS: No abnormal hypermetabolic activity within the liver, pancreas, adrenal glands, or spleen. No hypermetabolic lymph nodes in the abdomen or pelvis. Incidental CT findings: Liver unremarkable. Gallbladder grossly normal. Normal contour the spleen, normal splenic size. Adrenal glands with normal size. No hydronephrosis. Circumferential thickening of the urinary bladder nonspecific given collapse of the urinary bladder. No acute gastrointestinal process. The appendix is normal.  Abdominal aortic atherosclerosis. SKELETON: No focal hypermetabolic activity to suggest skeletal metastasis. Incidental CT findings: Cervical spinal fusion from C5 through C7. IMPRESSION: 1. Markedly hyperintense RIGHT lung mass with spiculated margins tethering the fissure in the posterior RIGHT upper lobe. 2. LEFT lower lobe mass displaying slightly less FDG uptake. 3. RIGHT hilar and mediastinal lymphadenopathy compatible with nodal involvement from primary lung cancer. 4. Smaller solid and part solid nodules also with variable degrees of FDG uptake but with less FDG uptake than dominant lesions remaining concerning for multifocal adenocarcinoma. 5. No definite disease beyond the chest and no signs of abdominal or pelvic involvement. 6. Extensive brown fat activation mildly limits assessment of supraclavicular regions and neck. 7. Pulmonary emphysema. 8. Aortic atherosclerosis. Aortic Atherosclerosis (ICD10-I70.0) and Emphysema (ICD10-J43.9). Electronically Signed   By: Zetta Bills M.D.   On: 04/02/2020 15:58   DG CHEST PORT 1 VIEW  Result Date: 03/30/2020 CLINICAL DATA:  Bronchial brushings and biopsies EXAM: PORTABLE CHEST 1 VIEW COMPARISON:  CT chest 03/19/2020, chest radiograph 06/27/2011 FINDINGS: Masslike areas of consolidation within the right upper lobe, right lower lobe, and left lower lobe, compatible with masses that were better characterized on recent CT chest. There are surrounding hazy opacities, greatest in the right upper lobe. No discernible pneumothorax on this portable semi erect AP radiograph. No evidence of acute osseous abnormality. Partially imaged cervical ACDF. IMPRESSION: 1. Masslike areas of consolidation within the right upper lobe, right lower lobe, and left lower lobe, compatible with masses that were better characterized on recent CT chest. 2. Hazy surrounding opacities, greatest in the right upper lobe are nonspecific, but could relate to pneumonia, aspiration, or  hemorrhage. Evaluation of chronicity is limited without recent radiographs for comparison. 3. No discernible pneumothorax on this portable semi erect AP radiograph. Electronically Signed   By: Margaretha Sheffield MD   On: 03/30/2020 10:28   CT CHEST LUNG CA SCREEN LOW DOSE W/O CM  Result Date: 03/20/2020 CLINICAL DATA:  64 year old male former smoker, quit 1 month ago, with 84 pack-year history of smoking, for initial lung cancer screening EXAM: CT CHEST WITHOUT CONTRAST LOW-DOSE FOR LUNG CANCER SCREENING TECHNIQUE: Multidetector CT imaging of the chest was performed following the standard protocol without IV contrast. COMPARISON:  None. FINDINGS: Cardiovascular: The heart is normal in size. No pericardial effusion. No evidence of thoracic aortic aneurysm. Mild atherosclerotic calcifications of the aortic arch. Mediastinum/Nodes: 12 mm short axis subcarinal node (series 2/image 31). Fullness of the right hilar region, favoring right hilar lymphadenopathy, although poorly evaluated on  unenhanced CT. Visualized thyroid is unremarkable. Lungs/Pleura: 2.4 x 4.9 x 2.5 cm spiculated mass in the posterior right upper lobe (series 3/image 141). Mass involves the right lateral chest wall (series 3/image 138) and right major fissure (series 3/image 145). 2.5 x 2.3 x 2.0 cm subsolid nodule in the right lower lobe (series 3/image 208), with associated 18 mm solid component (series 3/image 202). 4.5 x 3.8 x 7.0 cm mixed cystic/solid mass in the posterolateral left lower lobe (series 3/image 271). Additional scattered subsolid, cavitary, and ground-glass nodules in the lungs bilaterally. This appearance is considered highly suspicious for multifocal adenocarcinoma. Mild to moderate centrilobular and paraseptal emphysematous changes, upper lung predominant. No focal consolidation. No pleural effusion or pneumothorax. Upper Abdomen: Visualized upper abdomen is grossly unremarkable, noting vascular calcifications Musculoskeletal:  No suspicious osseous lesions. Cervical spine fixation hardware, incompletely visualized. IMPRESSION: Lung-RADS 4X, highly suspicious. Additional imaging evaluation or consultation with Pulmonology or Thoracic Surgery recommended. 4.9 cm spiculated solid mass in the posterior right upper lobe. 2.5 cm subsolid nodule in the right lower lobe with 18 mm solid component. 7.0 cm mixed cystic/solid mass in the posterolateral left lower lobe. These findings are highly suspicious for multifocal adenocarcinoma. Additional scattered nodules in the lungs bilaterally. Consider PET-CT further evaluation as clinically warranted. Aortic Atherosclerosis (ICD10-I70.0) and Emphysema (ICD10-J43.9). Electronically Signed   By: Julian Hy M.D.   On: 03/20/2020 08:34   CT Super D Chest Wo Contrast  Result Date: 03/27/2020 CLINICAL DATA:  Pulmonary nodules.  Preprocedure mapping. EXAM: CT CHEST WITHOUT CONTRAST TECHNIQUE: Multidetector CT imaging of the chest was performed using thin slice collimation for electromagnetic bronchoscopy planning purposes, without intravenous contrast. COMPARISON:  03/19/2020 FINDINGS: Cardiovascular: The heart size is normal. No substantial pericardial effusion. Atherosclerotic calcification is noted in the wall of the thoracic aorta. Mediastinum/Nodes: 9 mm short axis paratracheal lymph node on 48/2 is similar. 12 mm short axis subcarinal lymph node measured previously is stable. Fullness noted right hilar region. The esophagus has normal imaging features. There is no axillary lymphadenopathy. Lungs/Pleura: Multiple irregular/spiculated pulmonary nodules again noted as characterized on lung cancer screening CT 8 days ago. Dominant lesion in the posterior right upper lobe tethers the major fissure and measures 4.6 x 2.3 cm. Other scattered smaller lesions are seen in the posterior right upper lobe retro hilar right lower lobe, right middle lobe, inferior right lower lobe and posterior left lower  lobe. Upper Abdomen: Unremarkable. Musculoskeletal: No worrisome lytic or sclerotic osseous abnormality. Sclerotic focus in the T10 vertebral body is stable, likely benign. IMPRESSION: Multiple bilateral spiculated solid and sub solid pulmonary nodules and masses, as characterized on recent lung cancer screening CT. Imaging features concerning for multifocal adenocarcinoma. Borderline to mild mediastinal lymphadenopathy with fullness in the right hilar region. Metastatic disease a concern. Electronically Signed   By: Misty Stanley M.D.   On: 03/27/2020 10:33   DG C-ARM BRONCHOSCOPY  Result Date: 03/30/2020 C-ARM BRONCHOSCOPY: Fluoroscopy was utilized by the requesting physician.  No radiographic interpretation.    ASSESSMENT: This is a very pleasant 64 years old African-American male recently diagnosed with stage IV (T3, N2, M1 a) non-small cell lung cancer favoring adenocarcinoma presented with right upper lobe lung mass in addition to right lower lobe, left lower lobe in addition to right hilar and mediastinal lymphadenopathy diagnosed in October 2021.   PLAN: I had a lengthy discussion with the patient and his wife today about his current disease stage, prognosis and treatment options. I recommended for the  patient to proceed with the MRI of the brain as scheduled tomorrow to complete the staging work-up of his disease. His recent biopsies were insufficient for any additional studies. I recommended for the patient to send blood sample to [facility] for consideration of molecular studies. I will arrange for the patient a follow-up appointment with me in less than 2 weeks for discussion of the molecular studies and also more detailed discussion of his treatment options.  If the patient has no actionable mutations, will consider him for treatment with a combination of systemic chemotherapy with carboplatin and Alimta in addition to immunotherapy with Keytruda every 3 weeks. I discussed with the  patient briefly the adverse effect of the treatment. The patient and his wife agreed to the current plan. For the smoking cessation, I strongly encouraged the patient to continue with the smoke cessation. The patient was advised to call immediately if he has any concerning symptoms in the interval.  The patient voices understanding of current disease status and treatment options and is in agreement with the current care plan.  All questions were answered. The patient knows to call the clinic with any problems, questions or concerns. We can certainly see the patient much sooner if necessary.  Thank you so much for allowing me to participate in the care of [place]. I will continue to follow up the patient with you and assist in his care.  The total time spent in the appointment was 70 minutes.  Disclaimer: This note was dictated with voice recognition software. Similar sounding words can inadvertently be transcribed and may not be corrected upon review.   Eilleen Kempf April 06, 2020, 9:43 AM

## 2020-04-07 ENCOUNTER — Ambulatory Visit (HOSPITAL_COMMUNITY)
Admission: RE | Admit: 2020-04-07 | Discharge: 2020-04-07 | Disposition: A | Discharge status: Home / Self Care | Payer: 59 | Source: Ambulatory Visit | Attending: Pulmonary Disease | Admitting: Pulmonary Disease

## 2020-04-07 ENCOUNTER — Encounter: Payer: Self-pay | Admitting: Internal Medicine

## 2020-04-07 DIAGNOSIS — C3490 Malignant neoplasm of unspecified part of unspecified bronchus or lung: Secondary | ICD-10-CM | POA: Diagnosis not present

## 2020-04-07 DIAGNOSIS — C3491 Malignant neoplasm of unspecified part of right bronchus or lung: Secondary | ICD-10-CM | POA: Insufficient documentation

## 2020-04-07 MED ORDER — GADOBUTROL 1 MMOL/ML IV SOLN
6.0000 mL | Freq: Once | INTRAVENOUS | Status: AC | PRN
  Administered 2020-04-07: 6 mL via INTRAVENOUS

## 2020-04-09 ENCOUNTER — Telehealth: Payer: Self-pay | Admitting: Pulmonary Disease

## 2020-04-09 NOTE — Telephone Encounter (Signed)
Dr Valeta Harms- please see impression on MRI Brain from 04/07/20-  IMPRESSION: 1. Approximately 10 mm enhancing lesion involving the cerebellar vermis, 3 mm enhancing lesion in the right cerebellar hemisphere, and 5 mm enhancing lesion in the left parieto-occiptail region, concerning for metastases given the clinical history. There is mild surrounding edema without substantial mass effect. The fourth ventricle is widely patent. 2. Punctate apparent enhancement in the right cerebellar hemisphere is only seen on the coronal sequence (series 17, image 10). While this may be artifactual, it warrants attention on follow-up.  These results will be called to the ordering clinician or representative by the Radiologist Assistant, and communication documented in the PACS or [employer].   Electronically Signed   By: Margaretha Sheffield MD   On: 04/07/2020 14:32

## 2020-04-09 NOTE — Telephone Encounter (Signed)
4 mg p.o. 3 times daily or 4 times daily is good enough for now until he sees radiation oncology.  Thank you.

## 2020-04-09 NOTE — Telephone Encounter (Signed)
Thanks, I will communicate with Dr. Julien Nordmann as well as [facility] Blizzard as well.   Please start Decadron PO, 10mg  qday. I will let them or radiation oncology change or taper if and when needed.   Garner Nash, DO [facility] 04/09/2020 10:37 AM

## 2020-04-09 NOTE — Telephone Encounter (Signed)
Need to verify the pt's pharmacy before sending rx- LMTCB for the pt and will route back to triage for f/u

## 2020-04-09 NOTE — Telephone Encounter (Signed)
Attempted to call pt but unable to reach. Left message for pt to return call. When pt returns call, we can see if he has an appt scheduled with radiation oncology so we know what quantity to send in for pt on the decadron and also what pharmacy that the med needs to be sent to.  I have the med pended that we can send once pt returns call.

## 2020-04-09 NOTE — Telephone Encounter (Signed)
Can you change prescription to Dr. Julien Nordmann recs? Decadron 4mg  po TID  Garner Nash, DO [facility] 04/09/2020 3:14 PM

## 2020-04-10 ENCOUNTER — Telehealth: Payer: Self-pay | Admitting: Pulmonary Disease

## 2020-04-10 ENCOUNTER — Telehealth: Payer: Self-pay | Admitting: *Deleted

## 2020-04-10 ENCOUNTER — Other Ambulatory Visit: Payer: Self-pay | Admitting: Internal Medicine

## 2020-04-10 ENCOUNTER — Encounter: Payer: Self-pay | Admitting: *Deleted

## 2020-04-10 ENCOUNTER — Telehealth: Payer: Self-pay | Admitting: Radiation Oncology

## 2020-04-10 DIAGNOSIS — C3491 Malignant neoplasm of unspecified part of right bronchus or lung: Secondary | ICD-10-CM

## 2020-04-10 MED ORDER — DEXAMETHASONE 4 MG PO TABS: 4.0000 mg | ORAL_TABLET | Freq: Three times a day (TID) | ORAL | 0 refills | Status: DC

## 2020-04-10 NOTE — Telephone Encounter (Signed)
Duplicate msg.

## 2020-04-10 NOTE — Telephone Encounter (Signed)
I followed up on Nicolas Allen schedule.  He is not set up with infusion and rad onc due to unable to reach. I called him today.  We spoke about him getting set up for his treatment and he verbalized understanding.  He asked that they call him wife to set up appts.  I will notify scheduling to call his wife to schedule appts.

## 2020-04-10 NOTE — Telephone Encounter (Signed)
I received a message from rad onc that patient is confused about his plan of care.  I followed up with Dr. Julien Nordmann and he asked that I update him on plan of care. I called and spoke to patient.  I told him that he will get a call from rad onc to get him scheduled.  I will let rad onc know he is updated on plan.

## 2020-04-10 NOTE — Telephone Encounter (Signed)
Pt seeing radiation oncology 04/11/20- will go ahead and send in once month supply and they can refill from there. Rx was sent and detailed msg left for the pt.

## 2020-04-10 NOTE — Progress Notes (Signed)
Location/Histology of Brain Tumor: Cerebellar  Patient presented for screening CT due to long smoking history.  MRI Brain 04/07/2020: Approximately 10 mm enhancing lesion involving the cerebellar vermis, 3 mm enhancing lesion in the right cerebellar hemisphere, and 5 mm enhancing lesion in the left parieto-occiptail region, concerning for metastases given the clinical history. There is mild surrounding edema without substantial mass effect. The fourth ventricle is widely patent.  Dose of Decadron, if applicable: 4 mg TID  Recent neurologic symptoms, if any:   Seizures: No  Headaches: No  Nausea: No  Dizziness/ataxia: No  Difficulty with hand coordination: No  Focal numbness/weakness: No  Visual deficits/changes: No  Confusion/Memory deficits: No  Thoracic Location of Tumor / Histology: Right Lung/ Left Lung  PET 04/02/2020: Hyperintense right lung mass with spiculated margins tethering the fissure in the posterior right upper lobe.  The left lower lobe mass displayed slightly less FDG uptake and the right hilar and mediastinal lymphadenopathy are compatible with nodal involvement from the primary lung cancer.  There was a smaller solid and partially solid nodules with variable degrees of FDG uptake.  There was no definite disease beyond the chest and no signs of abdominal or pelvic involvement.  Bronchoscopy 03/30/2020: RUL, RLL, LLL- all consistent with non-small cell lung cancer, favoring adenocarcinoma.  CT Chest 03/19/2020: 2.4 x 4.9 x 2.5 cm spiculated mass in the posterior right upper lobe.  The mass involves the right lateral chest wall and the right major fissure.  There was also a 2.5 x 3.8 x 7.0 cm mixed cystic/solid mass in the posterior lateral left lower lobe.  There was also 2.5 x 2.3 x 2.0 cm subsolid nodule in the right lower lobe with associated 1.8 cm solid component.  The scan also showed fullness of the right hilar region favoring right hilar lymphadenopathy in  addition to 1.2 cm short axis subcarinal node.  Biopsies of RLL, RUL, LLL 03/30/2020      Tobacco/Marijuana/Snuff/ETOH use: Current Smoker, trying to quit.  Past/Anticipated interventions by cardiothoracic surgery, if any:   Past/Anticipated interventions by medical oncology, if any:  Dr. Julien Nordmann 04/06/2020  -I recommended for the patient to proceed with the MRI of the brain as scheduled tomorrow to complete the staging work-up of his disease. -I recommended for the patient to send blood sample to [facility] for consideration of molecular studies. -If the patient has no actionable mutations, will consider him for treatment with a combination of systemic chemotherapy with carboplatin and Alimta in addition to immunotherapy with Keytruda every 3 weeks. - Follow-up 04/25/2020   Signs/Symptoms  Weight changes, if any: No  Respiratory complaints, if any: No  Hemoptysis, if any: Occasional non-productive cough  Pain issues, if any:  No  SAFETY ISSUES:  Prior radiation? No  Pacemaker/ICD? No  Possible current pregnancy? n/a  Is the patient on methotrexate? No

## 2020-04-10 NOTE — Telephone Encounter (Signed)
Pt Saw Dr Earlie Server on 04/06/20

## 2020-04-10 NOTE — Telephone Encounter (Signed)
Still need the name of the pharmacy  [facility]

## 2020-04-10 NOTE — Telephone Encounter (Signed)
Received an urgent referral from Dr. Julien Nordmann for a radiation consult. When talking with the patient, he states he was under the impression that his plan was just chemo and not radiation. At the patient's request, I'm sending a message to Norton Blizzard, RN to follow up with the patient and let us know the outcome.

## 2020-04-10 NOTE — Telephone Encounter (Signed)
LMTCB- if he already was seen by Dr Earlie Server did he mention taking the decadron?

## 2020-04-10 NOTE — Progress Notes (Signed)
Urgent referral to rad onc completed

## 2020-04-10 NOTE — Telephone Encounter (Signed)
Calling to state that pharmacy is [facility] [address]. Pt can be reached at [phone]

## 2020-04-11 ENCOUNTER — Other Ambulatory Visit: Payer: Self-pay | Admitting: Radiation Therapy

## 2020-04-11 ENCOUNTER — Ambulatory Visit
Admission: RE | Admit: 2020-04-11 | Discharge: 2020-04-11 | Disposition: A | Discharge status: Home / Self Care | Payer: 59 | Source: Ambulatory Visit | Attending: Radiation Oncology | Admitting: Radiation Oncology

## 2020-04-11 ENCOUNTER — Telehealth: Payer: Self-pay

## 2020-04-11 ENCOUNTER — Encounter: Payer: Self-pay | Admitting: Radiation Oncology

## 2020-04-11 ENCOUNTER — Other Ambulatory Visit: Payer: Self-pay

## 2020-04-11 ENCOUNTER — Telehealth: Payer: Self-pay | Admitting: Internal Medicine

## 2020-04-11 VITALS — BP 127/71 | HR 66 | Temp 97.8°F | Resp 18 | Ht 72.0 in | Wt 140.4 lb

## 2020-04-11 DIAGNOSIS — C7931 Secondary malignant neoplasm of brain: Secondary | ICD-10-CM

## 2020-04-11 DIAGNOSIS — J341 Cyst and mucocele of nose and nasal sinus: Secondary | ICD-10-CM | POA: Diagnosis not present

## 2020-04-11 DIAGNOSIS — I700 Atherosclerosis of aorta: Secondary | ICD-10-CM | POA: Insufficient documentation

## 2020-04-11 DIAGNOSIS — F419 Anxiety disorder, unspecified: Secondary | ICD-10-CM | POA: Diagnosis not present

## 2020-04-11 DIAGNOSIS — I10 Essential (primary) hypertension: Secondary | ICD-10-CM | POA: Diagnosis not present

## 2020-04-11 DIAGNOSIS — J439 Emphysema, unspecified: Secondary | ICD-10-CM | POA: Diagnosis not present

## 2020-04-11 DIAGNOSIS — Z7982 Long term (current) use of aspirin: Secondary | ICD-10-CM | POA: Insufficient documentation

## 2020-04-11 DIAGNOSIS — D649 Anemia, unspecified: Secondary | ICD-10-CM | POA: Diagnosis not present

## 2020-04-11 DIAGNOSIS — F17210 Nicotine dependence, cigarettes, uncomplicated: Secondary | ICD-10-CM | POA: Insufficient documentation

## 2020-04-11 DIAGNOSIS — C3411 Malignant neoplasm of upper lobe, right bronchus or lung: Secondary | ICD-10-CM | POA: Diagnosis not present

## 2020-04-11 DIAGNOSIS — R590 Localized enlarged lymph nodes: Secondary | ICD-10-CM | POA: Insufficient documentation

## 2020-04-11 DIAGNOSIS — K589 Irritable bowel syndrome without diarrhea: Secondary | ICD-10-CM | POA: Insufficient documentation

## 2020-04-11 DIAGNOSIS — C3491 Malignant neoplasm of unspecified part of right bronchus or lung: Secondary | ICD-10-CM

## 2020-04-11 DIAGNOSIS — Z79899 Other long term (current) drug therapy: Secondary | ICD-10-CM | POA: Diagnosis not present

## 2020-04-11 DIAGNOSIS — N529 Male erectile dysfunction, unspecified: Secondary | ICD-10-CM | POA: Diagnosis not present

## 2020-04-11 DIAGNOSIS — K219 Gastro-esophageal reflux disease without esophagitis: Secondary | ICD-10-CM | POA: Insufficient documentation

## 2020-04-11 DIAGNOSIS — E785 Hyperlipidemia, unspecified: Secondary | ICD-10-CM | POA: Diagnosis not present

## 2020-04-11 NOTE — Progress Notes (Signed)
Has armband been applied?  Yes  Does patient have an allergy to IV contrast dye?: No   Has patient ever received premedication for IV contrast dye?: n/a  Does patient take metformin?: No  If patient does take metformin when was the last dose: n/a  Date of lab work: 04/06/2020 BUN: 8 CR: 0.93 eGfr: >60  IV site: Left AC  Has IV site been added to flowsheet?  Yes

## 2020-04-11 NOTE — Telephone Encounter (Signed)
Scheduled per los. Called and spoke with patient. Confirmed appt 

## 2020-04-11 NOTE — Telephone Encounter (Signed)
Nicolas Allen called requesting ICD10 code for pts lung adenocarcinoma.  I called her back and advised it is C34.92

## 2020-04-11 NOTE — Progress Notes (Addendum)
Radiation Oncology         [phone] ________________________________  Name: Nicolas Allen        MRN: [id number]  Date of Service: 04/11/2020 DOB: February 16, 1956  EN:IDPO, Hunt Oris, MD  Curt Bears, MD     REFERRING PHYSICIAN: Curt Bears, MD   DIAGNOSIS: The primary encounter diagnosis was Brain metastasis [facility]). Diagnoses of Non-small cell carcinoma of right lung, stage 4 ([place]) and Brain metastases ([place]) were also pertinent to this visit.   HISTORY OF PRESENT ILLNESS: Nicolas Allen is a 64 y.o. male seen at the request of Dr. Julien Nordmann for a newly diagnosed stage IV lung cancer with newly noted brain metastases.  The patient was seen for CT screening due to a longstanding history of tobacco use.  His scans were performed on 03/19/2020 revealing concerns for highly suspicious spiculated mass in the right upper lobe measuring 4.9 cm, a right lower lobe measuring 2.5 cm and a 15 mm solid component, and a 7 cm mixed cystic and solid mass in the posterior left lower lobe.  He underwent bronchoscopy on 03/30/2020 and final pathology revealed an adenocarcinoma in the right upper lobe, brushings of the right lower lobe, as well as fine-needle aspiration of the right lower lobe, and brushings of the left lower lobe and biopsy as well as level 7 node.  Given these findings his case had been discussed in conference and it was felt that he would be best suited for systemic therapy. A staging MRI of his brain on 04/07/2020 revealed 3 lesions including a 10 x 7 mm lesion in the cerebellar vermis with mild surrounding edema but without substantial mass-effect, the fourth ventricle was widely patent, a 3 mm focus of enhancement in the lateral right cerebellar hemisphere and a 5 mm focus of enhancement in the left parieto-occipital region were noted.  Additional punctate apparent enhancement in the right cerebellar hemisphere was only seen on the coronal sequence, and he is seen today to discuss options of  stereotactic radiosurgery.  He has plans to begin systemic therapy within the next 2 weeks.     PREVIOUS RADIATION THERAPY: No   PAST MEDICAL HISTORY:  Past Medical History:  Diagnosis Date  . Anemia 06/24/2012  . ANXIETY 02/03/2007  . Cervical disc disease 02/12/2012   S/p surgury jan 2013  . Erectile dysfunction 02/11/2011  . GERD (gastroesophageal reflux disease)   . GLUCOSE INTOLERANCE 01/04/2008  . HYPERLIPIDEMIA 02/03/2007  . HYPERTENSION 02/03/2007  . IBS (irritable bowel syndrome)   . Impaired glucose tolerance 02/11/2011  . Smoker 08/22/2014  . Substance abuse ([place]) 2001   sober for 16 yrs       PAST SURGICAL HISTORY: Past Surgical History:  Procedure Laterality Date  . BRONCHIAL BIOPSY  03/30/2020   Procedure: BRONCHIAL BIOPSIES;  Surgeon: Garner Nash, DO;  Location: [facility];  Service: Pulmonary;;  . BRONCHIAL BRUSHINGS  03/30/2020   Procedure: BRONCHIAL BRUSHINGS;  Surgeon: Garner Nash, DO;  Location: [facility];  Service: Pulmonary;;  . BRONCHIAL NEEDLE ASPIRATION BIOPSY  03/30/2020   Procedure: BRONCHIAL NEEDLE ASPIRATION BIOPSIES;  Surgeon: Garner Nash, DO;  Location: [facility];  Service: Pulmonary;;  . BRONCHIAL WASHINGS  03/30/2020   Procedure: BRONCHIAL WASHINGS;  Surgeon: Garner Nash, DO;  Location: [facility];  Service: Pulmonary;;  . COLONOSCOPY  2007  . neck fusion  2012   C4  . NO PAST SURGERIES    . VIDEO BRONCHOSCOPY WITH ENDOBRONCHIAL NAVIGATION N/A 03/30/2020  Procedure: VIDEO BRONCHOSCOPY WITH ENDOBRONCHIAL NAVIGATION;  Surgeon: Garner Nash, DO;  Location: [facility];  Service: Pulmonary;  Laterality: N/A;  . VIDEO BRONCHOSCOPY WITH ENDOBRONCHIAL ULTRASOUND N/A 03/30/2020   Procedure: VIDEO BRONCHOSCOPY WITH ENDOBRONCHIAL ULTRASOUND;  Surgeon: Garner Nash, DO;  Location: [facility];  Service: Pulmonary;  Laterality: N/A;     FAMILY HISTORY:  Family History  Problem Relation Age of Onset  . Cancer  Mother        esophagus  . Cancer Cousin   . Stroke Other   . Hypertension Other   . Colon cancer Neg Hx      SOCIAL HISTORY:  reports that he quit smoking about 7 weeks ago. His smoking use included cigarettes. He has a 45.00 pack-year smoking history. He has quit using smokeless tobacco.  His smokeless tobacco use included chew. He reports that he does not drink alcohol and does not use drugs.   ALLERGIES: Patient has no known allergies.   MEDICATIONS:  Current Outpatient Medications  Medication Sig Dispense Refill  . amLODipine-benazepril (LOTREL) 10-20 MG capsule TAKE 1 CAPSULE BY MOUTH ONCE DAILY . APPOINTMENT REQUIRED FOR FUTURE REFILLS 30 capsule 0  . aspirin EC 81 MG tablet Take 81 mg by mouth daily. Swallow whole.    . b complex vitamins tablet Take 1 tablet by mouth daily.    Marland Kitchen dexamethasone (DECADRON) 4 MG tablet Take 1 tablet (4 mg total) by mouth 3 (three) times daily. 90 tablet 0  . Garlic 1610 MG CAPS Take 1,000 mg by mouth daily.     . Omega-3 Fatty Acids (FISH OIL PO) Take 1 capsule by mouth daily.     . pravastatin (PRAVACHOL) 40 MG tablet TAKE 1 TABLET BY MOUTH ONCE DAILY . APPOINTMENT REQUIRED FOR FUTURE REFILLS 30 tablet 0  . vardenafil (LEVITRA) 20 MG tablet TAKE 1 TABLET BY MOUTH AS NEEDED FOR  ERECTILE  DYSFUNCTION 10 tablet 5  . Vitamin D, Ergocalciferol, (DRISDOL) 1.25 MG (50000 UNIT) CAPS capsule Take 1 capsule (50,000 Units total) by mouth every 7 (seven) days. (Patient taking differently: Take 50,000 Units by mouth every Sunday. ) 12 capsule 0   No current facility-administered medications for this encounter.     REVIEW OF SYSTEMS: On review of systems, the patient reports that he is doing well overall.  He reports that he has a dry cough at times but is nonproductive.  He has not had any significant weight loss per report.  He denies headaches or visual changes.  He is not experiencing any dizziness, gait imbalance or feeling disruption in equilibrium.   He just picked up a prescription for dexamethasone and is planning to take it 4 mg 3 times a day.  He reports he is anxious to get started with his treatment.  No other complaints are verbalized.     PHYSICAL EXAM:  Wt Readings from Last 3 Encounters:  04/11/20 140 lb 6.4 oz (63.7 kg)  04/06/20 140 lb 14.4 oz (63.9 kg)  03/30/20 142 lb (64.4 kg)   Temp Readings from Last 3 Encounters:  04/11/20 97.8 F (36.6 C)  04/06/20 97.6 F (36.4 C) (Tympanic)  03/30/20 97.8 F (36.6 C) (Oral)   BP Readings from Last 3 Encounters:  04/11/20 127/71  04/06/20 126/63  03/30/20 127/60   Pulse Readings from Last 3 Encounters:  04/11/20 66  04/06/20 (!) 57  03/30/20 79   Pain Assessment Pain Score: 0-No pain/10  In general this is a well appearing African-American  male in no acute distress.  He's alert and oriented x4 and appropriate throughout the examination. Cardiopulmonary assessment is negative for acute distress and he exhibits normal effort.     ECOG = 1  0 - Asymptomatic (Fully active, able to carry on all predisease activities without restriction)  1 - Symptomatic but completely ambulatory (Restricted in physically strenuous activity but ambulatory and able to carry out work of a light or sedentary nature. For example, light housework, office work)  2 - Symptomatic, <50% in bed during the day (Ambulatory and capable of all self care but unable to carry out any work activities. Up and about more than 50% of waking hours)  3 - Symptomatic, >50% in bed, but not bedbound (Capable of only limited self-care, confined to bed or chair 50% or more of waking hours)  4 - Bedbound (Completely disabled. Cannot carry on any self-care. Totally confined to bed or chair)  5 - Death   Eustace Pen MM, Creech RH, Tormey DC, et al. [phone]). "Toxicity and response criteria of the [facility]". [place] Oncol. 5 (6): 649-55    LABORATORY DATA:  Lab Results  Component Value  Date   WBC 5.8 04/06/2020   HGB 12.6 (L) 04/06/2020   HCT 37.6 (L) 04/06/2020   MCV 81.9 04/06/2020   PLT 151 04/06/2020   Lab Results  Component Value Date   NA 140 04/06/2020   K 4.3 04/06/2020   CL 105 04/06/2020   CO2 30 04/06/2020   Lab Results  Component Value Date   ALT 11 04/06/2020   AST 18 04/06/2020   ALKPHOS 52 04/06/2020   BILITOT 0.6 04/06/2020      RADIOGRAPHY: MR BRAIN W WO CONTRAST  Result Date: 04/07/2020 CLINICAL DATA:  Non-small cell lung cancer staging. EXAM: MRI HEAD WITHOUT AND WITH CONTRAST TECHNIQUE: Multiplanar, multiecho pulse sequences of the brain and surrounding structures were obtained without and with intravenous contrast. CONTRAST:  43mL GADAVIST GADOBUTROL 1 MMOL/ML IV SOLN COMPARISON:  None. FINDINGS: Brain: No acute infarct. No hydrocephalus. No acute hemorrhage. Scattered T2/FLAIR hyperintensities within the white matter, compatible with chronic microvascular ischemic disease. There is an approximately 10 x 7 mm enhancing lesion involving the cerebellar vermis (see series 16, image 38 and series 18, image 14).There is mild surrounding edema without substantial mass effect. The fourth ventricle is widely patent. Approximately 3 mm focus of enhancement in the lateral right cerebellar hemisphere (series 16, image 39 and series 18, image 7) Approximately 5 mm focus of enhancement in the left parieto-occipital region (series 16, image 108 and series 18, image 20) with mild surrounding edema. Additional punctate apparent enhancement in the right cerebellar hemisphere is only seen on the coronal sequence (series 17, image 10). Vascular: Major proximal arterial flow voids are maintained at the skull base. Skull and upper cervical spine: Normal marrow signal. Sinuses/Orbits: Right maxillary sinus retention cyst. Unremarkable orbits. Other: No mastoid effusions. IMPRESSION: 1. Approximately 10 mm enhancing lesion involving the cerebellar vermis, 3 mm enhancing  lesion in the right cerebellar hemisphere, and 5 mm enhancing lesion in the left parieto-occiptail region, concerning for metastases given the clinical history. There is mild surrounding edema without substantial mass effect. The fourth ventricle is widely patent. 2. Punctate apparent enhancement in the right cerebellar hemisphere is only seen on the coronal sequence (series 17, image 10). While this may be artifactual, it warrants attention on follow-up. These results will be called to the ordering clinician or representative by  the Psychologist, clinical, and communication documented in the PACS or [employer]. Electronically Signed   By: Margaretha Sheffield MD   On: 04/07/2020 14:32   NM PET Image Initial (PI) Skull Base To Thigh  Result Date: 04/02/2020 CLINICAL DATA:  Is initial treatment strategy for pulmonary lesions suspicious for multifocal adenocarcinoma. EXAM: NUCLEAR MEDICINE PET SKULL BASE TO THIGH TECHNIQUE: 7.3 mCi F-18 FDG was injected intravenously. Full-ring PET imaging was performed from the skull base to thigh after the radiotracer. CT data was obtained and used for attenuation correction and anatomic localization. Fasting blood glucose: 91 mg/dl COMPARISON:  CT of the chest from March 27, 2020 FINDINGS: Mediastinal blood pool activity: SUV max 1.67 Liver activity: SUV max NA NECK: Diffuse brown fat activity throughout the neck and chest. This is present in the supraclavicular region bilaterally and in the supraclavicular fossa extending posteriorly as well as associated with paraspinous uptake. No focal nodal enlargement or hypermetabolic lymph nodes in the neck. Incidental CT findings: Cervical spinal fusion with some streak artifact. CHEST: Pulmonary lesions with increased FDG uptake. RIGHT upper lobe mass (image 31, series 8 and image 74 of series 4) not changed from the CT of 3 days ago. (SUVmax = 9.7 LEFT lower lobe cystic and solid area with adjacent septal thickening, compatible  with LEFT lower lobe mass (SUVmax = 5.1) Part solid nodule in the RIGHT lower lobe (image 89 of series 4) also unchanged (SUVmax = 2.5) Part solid, mainly solid nodule just posterior to the major fissure and hilar structures in the RIGHT chest in the RIGHT lower lobe (image 33, series 8) 1.5 cm with mildly increased FDG uptake (SUVmax = 2.8) Low level metabolic activity present and smaller pulmonary nodules. Spiculated nodule in the RIGHT lung apex (image 14, series 8) SUV value less than mediastinal blood pool but suspicious based on morphologic features. Other areas of ground-glass and nodularity unchanged from recent CT imaging of the chest none with metabolic uptake exceeding the dominant areas in the RIGHT upper and LEFT lower lobes. RIGHT hilar adenopathy on image 75 of series 4 (SUVmax = 9.2) this may represent adjacent juxta hilar lymph nodes. Subcarinal nodal tissue 13 mm short axis with intense metabolic activity (SUVmax = 10.2) Precarinal nodal tissue with increased metabolic activity of similar intensity. High RIGHT paratracheal lymph node (image 63 of series 4) 1 cm (SUVmax = 7.7) LEFT para esophageal activity without corresponding lymph node or soft tissue likely related to brown fat activity (image 64 of series 4) no LEFT hilar uptake Incidental CT findings: Calcified atheromatous plaque of the thoracic aorta similar to previous imaging. No pericardial effusion. Esophagus grossly normal. Background pulmonary emphysema ABDOMEN/PELVIS: No abnormal hypermetabolic activity within the liver, pancreas, adrenal glands, or spleen. No hypermetabolic lymph nodes in the abdomen or pelvis. Incidental CT findings: Liver unremarkable. Gallbladder grossly normal. Normal contour the spleen, normal splenic size. Adrenal glands with normal size. No hydronephrosis. Circumferential thickening of the urinary bladder nonspecific given collapse of the urinary bladder. No acute gastrointestinal process. The appendix is  normal. Abdominal aortic atherosclerosis. SKELETON: No focal hypermetabolic activity to suggest skeletal metastasis. Incidental CT findings: Cervical spinal fusion from C5 through C7. IMPRESSION: 1. Markedly hyperintense RIGHT lung mass with spiculated margins tethering the fissure in the posterior RIGHT upper lobe. 2. LEFT lower lobe mass displaying slightly less FDG uptake. 3. RIGHT hilar and mediastinal lymphadenopathy compatible with nodal involvement from primary lung cancer. 4. Smaller solid and part solid nodules also with variable degrees  of FDG uptake but with less FDG uptake than dominant lesions remaining concerning for multifocal adenocarcinoma. 5. No definite disease beyond the chest and no signs of abdominal or pelvic involvement. 6. Extensive brown fat activation mildly limits assessment of supraclavicular regions and neck. 7. Pulmonary emphysema. 8. Aortic atherosclerosis. Aortic Atherosclerosis (ICD10-I70.0) and Emphysema (ICD10-J43.9). Electronically Signed   By: Zetta Bills M.D.   On: 04/02/2020 15:58   DG CHEST PORT 1 VIEW  Result Date: 03/30/2020 CLINICAL DATA:  Bronchial brushings and biopsies EXAM: PORTABLE CHEST 1 VIEW COMPARISON:  CT chest 03/19/2020, chest radiograph 06/27/2011 FINDINGS: Masslike areas of consolidation within the right upper lobe, right lower lobe, and left lower lobe, compatible with masses that were better characterized on recent CT chest. There are surrounding hazy opacities, greatest in the right upper lobe. No discernible pneumothorax on this portable semi erect AP radiograph. No evidence of acute osseous abnormality. Partially imaged cervical ACDF. IMPRESSION: 1. Masslike areas of consolidation within the right upper lobe, right lower lobe, and left lower lobe, compatible with masses that were better characterized on recent CT chest. 2. Hazy surrounding opacities, greatest in the right upper lobe are nonspecific, but could relate to pneumonia, aspiration, or  hemorrhage. Evaluation of chronicity is limited without recent radiographs for comparison. 3. No discernible pneumothorax on this portable semi erect AP radiograph. Electronically Signed   By: Margaretha Sheffield MD   On: 03/30/2020 10:28   CT CHEST LUNG CA SCREEN LOW DOSE W/O CM  Result Date: 03/20/2020 CLINICAL DATA:  64 year old male former smoker, quit 1 month ago, with 84 pack-year history of smoking, for initial lung cancer screening EXAM: CT CHEST WITHOUT CONTRAST LOW-DOSE FOR LUNG CANCER SCREENING TECHNIQUE: Multidetector CT imaging of the chest was performed following the standard protocol without IV contrast. COMPARISON:  None. FINDINGS: Cardiovascular: The heart is normal in size. No pericardial effusion. No evidence of thoracic aortic aneurysm. Mild atherosclerotic calcifications of the aortic arch. Mediastinum/Nodes: 12 mm short axis subcarinal node (series 2/image 31). Fullness of the right hilar region, favoring right hilar lymphadenopathy, although poorly evaluated on unenhanced CT. Visualized thyroid is unremarkable. Lungs/Pleura: 2.4 x 4.9 x 2.5 cm spiculated mass in the posterior right upper lobe (series 3/image 141). Mass involves the right lateral chest wall (series 3/image 138) and right major fissure (series 3/image 145). 2.5 x 2.3 x 2.0 cm subsolid nodule in the right lower lobe (series 3/image 208), with associated 18 mm solid component (series 3/image 202). 4.5 x 3.8 x 7.0 cm mixed cystic/solid mass in the posterolateral left lower lobe (series 3/image 271). Additional scattered subsolid, cavitary, and ground-glass nodules in the lungs bilaterally. This appearance is considered highly suspicious for multifocal adenocarcinoma. Mild to moderate centrilobular and paraseptal emphysematous changes, upper lung predominant. No focal consolidation. No pleural effusion or pneumothorax. Upper Abdomen: Visualized upper abdomen is grossly unremarkable, noting vascular calcifications Musculoskeletal:  No suspicious osseous lesions. Cervical spine fixation hardware, incompletely visualized. IMPRESSION: Lung-RADS 4X, highly suspicious. Additional imaging evaluation or consultation with Pulmonology or Thoracic Surgery recommended. 4.9 cm spiculated solid mass in the posterior right upper lobe. 2.5 cm subsolid nodule in the right lower lobe with 18 mm solid component. 7.0 cm mixed cystic/solid mass in the posterolateral left lower lobe. These findings are highly suspicious for multifocal adenocarcinoma. Additional scattered nodules in the lungs bilaterally. Consider PET-CT further evaluation as clinically warranted. Aortic Atherosclerosis (ICD10-I70.0) and Emphysema (ICD10-J43.9). Electronically Signed   By: Julian Hy M.D.   On: 03/20/2020 08:34  CT Super D Chest Wo Contrast  Result Date: 03/27/2020 CLINICAL DATA:  Pulmonary nodules.  Preprocedure mapping. EXAM: CT CHEST WITHOUT CONTRAST TECHNIQUE: Multidetector CT imaging of the chest was performed using thin slice collimation for electromagnetic bronchoscopy planning purposes, without intravenous contrast. COMPARISON:  03/19/2020 FINDINGS: Cardiovascular: The heart size is normal. No substantial pericardial effusion. Atherosclerotic calcification is noted in the wall of the thoracic aorta. Mediastinum/Nodes: 9 mm short axis paratracheal lymph node on 48/2 is similar. 12 mm short axis subcarinal lymph node measured previously is stable. Fullness noted right hilar region. The esophagus has normal imaging features. There is no axillary lymphadenopathy. Lungs/Pleura: Multiple irregular/spiculated pulmonary nodules again noted as characterized on lung cancer screening CT 8 days ago. Dominant lesion in the posterior right upper lobe tethers the major fissure and measures 4.6 x 2.3 cm. Other scattered smaller lesions are seen in the posterior right upper lobe retro hilar right lower lobe, right middle lobe, inferior right lower lobe and posterior left lower  lobe. Upper Abdomen: Unremarkable. Musculoskeletal: No worrisome lytic or sclerotic osseous abnormality. Sclerotic focus in the T10 vertebral body is stable, likely benign. IMPRESSION: Multiple bilateral spiculated solid and sub solid pulmonary nodules and masses, as characterized on recent lung cancer screening CT. Imaging features concerning for multifocal adenocarcinoma. Borderline to mild mediastinal lymphadenopathy with fullness in the right hilar region. Metastatic disease a concern. Electronically Signed   By: Misty Stanley M.D.   On: 03/27/2020 10:33   DG C-ARM BRONCHOSCOPY  Result Date: 03/30/2020 C-ARM BRONCHOSCOPY: Fluoroscopy was utilized by the requesting physician.  No radiographic interpretation.       IMPRESSION/PLAN: 1. Stage IV non-small cell lung cancer, adenocarcinoma involving multifocal disease in the lung and 3 brain metastases.  Dr. Lisbeth Renshaw discusses the patient's work-up and pathology results to date.  He also reviews the imaging, and reviews the rationale for considering radiosurgery to treat these lesions in the brain.  Fortunately the patient is asymptomatic in the brain, and we discussed changing the frequency of his steroids.  He is now to take dexamethasone 4 mg tablets twice daily.  We reviewed the rationale to also have a 3T MRI scan for further clarity in diagnosis and treatment of his brain disease and he is aware that with more detailed imaging there could be additional areas that require treatment as well.  We will coordinate this with the help of our navigator, he is an established with a neurosurgeon so we will also set him up to be evaluated at [facility].  Dr. Lisbeth Renshaw discusses the risks, benefits, short and long-term effects of radiotherapy and anticipates a single fraction treatment to these 3 lesions.  These would be treated as 3 separate isocenters. Written consent is obtained and placed in the chart, a copy was provided to the patient.  We  expect that his treatment would be completed by the middle of November.  The patient will otherwise proceed with systemic therapy under the care of Dr. Julien Nordmann.  In a visit lasting 60 minutes, greater than 50% of the time was spent face to face discussing the patient's condition, in preparation for the discussion, and coordinating the patient's care.   The above documentation reflects my direct findings during this shared patient visit. Please see the separate note by Dr. Lisbeth Renshaw on this date for the remainder of the patient's plan of care.    Carola Rhine, PAC

## 2020-04-12 ENCOUNTER — Telehealth: Payer: Self-pay | Admitting: Physician Assistant

## 2020-04-12 ENCOUNTER — Ambulatory Visit
Admission: RE | Admit: 2020-04-12 | Discharge: 2020-04-12 | Disposition: A | Discharge status: Home / Self Care | Payer: 59 | Source: Ambulatory Visit | Attending: Radiation Oncology | Admitting: Radiation Oncology

## 2020-04-12 DIAGNOSIS — C7931 Secondary malignant neoplasm of brain: Secondary | ICD-10-CM

## 2020-04-12 MED ORDER — GADOBENATE DIMEGLUMINE 529 MG/ML IV SOLN
13.0000 mL | Freq: Once | INTRAVENOUS | Status: AC | PRN
  Administered 2020-04-12: 13 mL via INTRAVENOUS

## 2020-04-12 NOTE — Telephone Encounter (Signed)
I received a message from radiation oncology that his patient was concerned that his next follow up appointment was not scheduled until 04/25/20. We received his molecular studies today. I will arrange for the patient to be seen in the clinic next week to discuss systemic chemotherapy. I discussed the above with the patient's wife and have sent a scheduling message.

## 2020-04-13 ENCOUNTER — Encounter: Payer: Self-pay | Admitting: Internal Medicine

## 2020-04-13 ENCOUNTER — Other Ambulatory Visit: Payer: Self-pay

## 2020-04-13 ENCOUNTER — Ambulatory Visit
Admission: RE | Admit: 2020-04-13 | Discharge: 2020-04-13 | Disposition: A | Discharge status: Home / Self Care | Payer: 59 | Source: Ambulatory Visit | Attending: Radiation Oncology | Admitting: Radiation Oncology

## 2020-04-13 DIAGNOSIS — C7931 Secondary malignant neoplasm of brain: Secondary | ICD-10-CM | POA: Diagnosis present

## 2020-04-13 LAB — GUARDANT 360

## 2020-04-13 MED ORDER — SODIUM CHLORIDE 0.9% FLUSH
10.0000 mL | Freq: Once | INTRAVENOUS | Status: AC
  Administered 2020-04-13: 10 mL via INTRAVENOUS

## 2020-04-16 ENCOUNTER — Ambulatory Visit: Payer: 59

## 2020-04-16 ENCOUNTER — Other Ambulatory Visit: Payer: Self-pay | Admitting: Physician Assistant

## 2020-04-16 ENCOUNTER — Ambulatory Visit: Payer: 59 | Admitting: Radiation Oncology

## 2020-04-16 DIAGNOSIS — R911 Solitary pulmonary nodule: Secondary | ICD-10-CM

## 2020-04-17 ENCOUNTER — Inpatient Hospital Stay (HOSPITAL_BASED_OUTPATIENT_CLINIC_OR_DEPARTMENT_OTHER): Payer: 59 | Admitting: Internal Medicine

## 2020-04-17 ENCOUNTER — Inpatient Hospital Stay: Payer: 59 | Attending: Internal Medicine

## 2020-04-17 ENCOUNTER — Encounter: Payer: Self-pay | Admitting: Internal Medicine

## 2020-04-17 ENCOUNTER — Other Ambulatory Visit: Payer: Self-pay

## 2020-04-17 VITALS — BP 130/78 | HR 66 | Temp 97.8°F | Resp 18 | Ht 72.0 in | Wt 146.7 lb

## 2020-04-17 DIAGNOSIS — C7931 Secondary malignant neoplasm of brain: Secondary | ICD-10-CM | POA: Insufficient documentation

## 2020-04-17 DIAGNOSIS — Z87891 Personal history of nicotine dependence: Secondary | ICD-10-CM | POA: Diagnosis not present

## 2020-04-17 DIAGNOSIS — Z5112 Encounter for antineoplastic immunotherapy: Secondary | ICD-10-CM | POA: Insufficient documentation

## 2020-04-17 DIAGNOSIS — Z5111 Encounter for antineoplastic chemotherapy: Secondary | ICD-10-CM | POA: Insufficient documentation

## 2020-04-17 DIAGNOSIS — Z79899 Other long term (current) drug therapy: Secondary | ICD-10-CM | POA: Diagnosis not present

## 2020-04-17 DIAGNOSIS — C3411 Malignant neoplasm of upper lobe, right bronchus or lung: Secondary | ICD-10-CM | POA: Diagnosis present

## 2020-04-17 DIAGNOSIS — Z7189 Other specified counseling: Secondary | ICD-10-CM

## 2020-04-17 DIAGNOSIS — I10 Essential (primary) hypertension: Secondary | ICD-10-CM

## 2020-04-17 DIAGNOSIS — C3491 Malignant neoplasm of unspecified part of right bronchus or lung: Secondary | ICD-10-CM | POA: Diagnosis not present

## 2020-04-17 DIAGNOSIS — C3432 Malignant neoplasm of lower lobe, left bronchus or lung: Secondary | ICD-10-CM | POA: Diagnosis not present

## 2020-04-17 DIAGNOSIS — R911 Solitary pulmonary nodule: Secondary | ICD-10-CM

## 2020-04-17 DIAGNOSIS — C3431 Malignant neoplasm of lower lobe, right bronchus or lung: Secondary | ICD-10-CM | POA: Diagnosis not present

## 2020-04-17 LAB — CMP (CANCER CENTER ONLY)
ALT: 17 U/L (ref 0–44)
AST: 15 U/L (ref 15–41)
Albumin: 3.8 g/dL (ref 3.5–5.0)
Alkaline Phosphatase: 57 U/L (ref 38–126)
Anion gap: 7 (ref 5–15)
BUN: 13 mg/dL (ref 8–23)
CO2: 30 mmol/L (ref 22–32)
Calcium: 8.9 mg/dL (ref 8.9–10.3)
Chloride: 101 mmol/L (ref 98–111)
Creatinine: 0.9 mg/dL (ref 0.61–1.24)
GFR, Estimated: >60 mL/min (ref >60)
Glucose, Bld: 115 mg/dL — ABNORMAL HIGH (ref 70–99)
Potassium: 3.5 mmol/L (ref 3.5–5.1)
Sodium: 138 mmol/L (ref 135–145)
Total Bilirubin: 0.3 mg/dL (ref 0.3–1.2)
Total Protein: 6.9 g/dL (ref 6.5–8.1)

## 2020-04-17 LAB — CBC WITH DIFFERENTIAL (CANCER CENTER ONLY)
Abs Immature Granulocytes: 0.39 10*3/uL — ABNORMAL HIGH (ref 0.00–0.07)
Basophils Absolute: 0 10*3/uL (ref 0.0–0.1)
Basophils Relative: 0 %
Eosinophils Absolute: 0 10*3/uL (ref 0.0–0.5)
Eosinophils Relative: 0 %
HCT: 38.4 % — ABNORMAL LOW (ref 39.0–52.0)
Hemoglobin: 13.1 g/dL (ref 13.0–17.0)
Immature Granulocytes: 2 %
Lymphocytes Relative: 12 %
Lymphs Abs: 1.9 10*3/uL (ref 0.7–4.0)
MCH: 27.6 pg (ref 26.0–34.0)
MCHC: 34.1 g/dL (ref 30.0–36.0)
MCV: 81 fL (ref 80.0–100.0)
Monocytes Absolute: 0.7 10*3/uL (ref 0.1–1.0)
Monocytes Relative: 5 %
Neutro Abs: 12.9 10*3/uL — ABNORMAL HIGH (ref 1.7–7.7)
Neutrophils Relative %: 81 %
Platelet Count: 210 10*3/uL (ref 150–400)
RBC: 4.74 MIL/uL (ref 4.22–5.81)
RDW: 14.2 % (ref 11.5–15.5)
WBC Count: 15.9 10*3/uL — ABNORMAL HIGH (ref 4.0–10.5)
nRBC: 0 % (ref 0.0–0.2)

## 2020-04-17 MED ORDER — LIDOCAINE-PRILOCAINE 2.5-2.5 % EX CREA: TOPICAL_CREAM | CUTANEOUS | 0 refills | Status: AC

## 2020-04-17 MED ORDER — CYANOCOBALAMIN 1000 MCG/ML IJ SOLN
1000.0000 ug | Freq: Once | INTRAMUSCULAR | Status: AC
  Administered 2020-04-17: 1000 ug via INTRAMUSCULAR

## 2020-04-17 MED ORDER — FOLIC ACID 1 MG PO TABS: 1.0000 mg | ORAL_TABLET | Freq: Every day | ORAL | 4 refills | Status: DC

## 2020-04-17 MED ORDER — PROCHLORPERAZINE MALEATE 10 MG PO TABS: 10.0000 mg | ORAL_TABLET | Freq: Four times a day (QID) | ORAL | 0 refills | Status: DC | PRN

## 2020-04-17 MED ORDER — CYANOCOBALAMIN 1000 MCG/ML IJ SOLN
INTRAMUSCULAR | Status: AC
  Filled 2020-04-17: qty 1

## 2020-04-17 NOTE — Progress Notes (Signed)
START ON PATHWAY REGIMEN - Non-Small Cell Lung     A cycle is every 21 days:     Pembrolizumab      Pemetrexed      Carboplatin   **Always confirm dose/schedule in your pharmacy ordering system**  Patient Characteristics: Stage IV Metastatic, Nonsquamous, Initial Chemotherapy/Immunotherapy, PS = 0, 1, ALK Rearrangement Negative and ROS1 Rearrangement Negative and NTRK Gene Fusion?Negative and RET Gene Fusion?Negative and EGFR Mutation Negative, PD-L1 Expression Positive  1-49% (TPS) / Negative / Not Tested / Awaiting Test Results and Immunotherapy Candidate Therapeutic Status: Stage IV Metastatic Histology: Nonsquamous Cell ROS1 Rearrangement Status: Negative Other Mutations/Biomarkers: No Other Actionable Mutations Chemotherapy/Immunotherapy LOT: Initial Chemotherapy/Immunotherapy Molecular Targeted Therapy: Not Appropriate KRAS G12C Mutation Status: Negative MET Exon 14 Mutation Status: Negative RET Gene Fusion Status: Negative EGFR Mutation Status: Negative/Wild Type NTRK Gene Fusion Status: Negative PD-L1 Expression Status: Did Not Order Test ALK Rearrangement Status: Negative BRAF V600E Mutation Status: Negative ECOG Performance Status: 1 Biomarker Assessment Status Confirmation: All Genomic Markers Negative, or Only MET+ or BRAF+ or KRAS G12C+ Immunotherapy Candidate Status: Candidate for Immunotherapy Intent of Therapy: Non-Curative / Palliative Intent, Discussed with Patient

## 2020-04-17 NOTE — Progress Notes (Signed)
[facility] [phone]   Fax:([phone]  OFFICE PROGRESS NOTE  [name], MD [name] [address]  DIAGNOSIS: Stage IV (T3, N2, M1c) non-small cell lung cancer favoring adenocarcinoma presented with right upper lobe lung mass in addition to right lower lobe, left lower lobe in addition to right hilar and mediastinal lymphadenopathy in addition to metastatic brain lesions diagnosed in October 2021.  Molecular studies by [employer]:  [id number], 9.5%,   PRIOR THERAPY: SRS to 3 brain lesion under the care of Dr. Lisbeth Renshaw.  Scheduled for April 20, 2020  CURRENT THERAPY: Systemic chemotherapy with carboplatin for AUC of 5, Alimta 500 mg/M2 and Keytruda 200 mg IV every 3 weeks.  First dose April 24, 2020  INTERVAL HISTORY: Nicolas Allen 64 y.o. male returns to the clinic today for follow-up visit accompanied by his wife.  The patient is feeling fine today with no concerning complaints except for fatigue and mild shortness of breath with exertion.  He also has some pain in the right hip area.  He denied having any chest pain, cough or hemoptysis.  He denied having any nausea, vomiting, diarrhea or constipation.  He denied having any headache or visual changes.  He had molecular studies by [employer] that showed no actionable mutations.  The patient had MRI of the brain that showed 3 subcentimeter brain metastasis.  He was seen by Dr. Lisbeth Renshaw and expected to have [place] to the brain on April 20, 2020.  The patient is here today for evaluation and discussion of his treatment options based on the recent staging work-up and molecular studies.  MEDICAL HISTORY: Past Medical History:  Diagnosis Date  . Anemia 06/24/2012  . ANXIETY 02/03/2007  . Cervical disc disease 02/12/2012   S/p surgury jan 2013  . Erectile dysfunction 02/11/2011  . GERD (gastroesophageal reflux disease)   . GLUCOSE INTOLERANCE 01/04/2008  . HYPERLIPIDEMIA 02/03/2007   . HYPERTENSION 02/03/2007  . IBS (irritable bowel syndrome)   . Impaired glucose tolerance 02/11/2011  . Smoker 08/22/2014  . Substance abuse ([place]) 2001   sober for 16 yrs    ALLERGIES:  has No Known Allergies.  MEDICATIONS:  Current Outpatient Medications  Medication Sig Dispense Refill  . amLODipine-benazepril (LOTREL) 10-20 MG capsule TAKE 1 CAPSULE BY MOUTH ONCE DAILY . APPOINTMENT REQUIRED FOR FUTURE REFILLS 30 capsule 0  . aspirin EC 81 MG tablet Take 81 mg by mouth daily. Swallow whole.    . b complex vitamins tablet Take 1 tablet by mouth daily.    Marland Kitchen dexamethasone (DECADRON) 4 MG tablet Take 1 tablet (4 mg total) by mouth 3 (three) times daily. 90 tablet 0  . Garlic 8119 MG CAPS Take 1,000 mg by mouth daily.     . Omega-3 Fatty Acids (FISH OIL PO) Take 1 capsule by mouth daily.     . pravastatin (PRAVACHOL) 40 MG tablet TAKE 1 TABLET BY MOUTH ONCE DAILY . APPOINTMENT REQUIRED FOR FUTURE REFILLS 30 tablet 0  . vardenafil (LEVITRA) 20 MG tablet TAKE 1 TABLET BY MOUTH AS NEEDED FOR  ERECTILE  DYSFUNCTION 10 tablet 5  . Vitamin D, Ergocalciferol, (DRISDOL) 1.25 MG (50000 UNIT) CAPS capsule Take 1 capsule (50,000 Units total) by mouth every 7 (seven) days. (Patient taking differently: Take 50,000 Units by mouth every Sunday. ) 12 capsule 0   No current facility-administered medications for this visit.    SURGICAL HISTORY:  Past Surgical History:  Procedure Laterality Date  . BRONCHIAL BIOPSY  03/30/2020   Procedure: BRONCHIAL BIOPSIES;  Surgeon: Garner Nash, DO;  Location: [facility];  Service: Pulmonary;;  . BRONCHIAL BRUSHINGS  03/30/2020   Procedure: BRONCHIAL BRUSHINGS;  Surgeon: Garner Nash, DO;  Location: [facility];  Service: Pulmonary;;  . BRONCHIAL NEEDLE ASPIRATION BIOPSY  03/30/2020   Procedure: BRONCHIAL NEEDLE ASPIRATION BIOPSIES;  Surgeon: Garner Nash, DO;  Location: [facility];  Service: Pulmonary;;  . BRONCHIAL WASHINGS  03/30/2020    Procedure: BRONCHIAL WASHINGS;  Surgeon: Garner Nash, DO;  Location: [facility];  Service: Pulmonary;;  . COLONOSCOPY  2007  . neck fusion  2012   C4  . NO PAST SURGERIES    . VIDEO BRONCHOSCOPY WITH ENDOBRONCHIAL NAVIGATION N/A 03/30/2020   Procedure: VIDEO BRONCHOSCOPY WITH ENDOBRONCHIAL NAVIGATION;  Surgeon: Garner Nash, DO;  Location: [facility];  Service: Pulmonary;  Laterality: N/A;  . VIDEO BRONCHOSCOPY WITH ENDOBRONCHIAL ULTRASOUND N/A 03/30/2020   Procedure: VIDEO BRONCHOSCOPY WITH ENDOBRONCHIAL ULTRASOUND;  Surgeon: Garner Nash, DO;  Location: [facility];  Service: Pulmonary;  Laterality: N/A;    REVIEW OF SYSTEMS:  Constitutional: positive for fatigue Eyes: negative Ears, nose, mouth, throat, and face: negative Respiratory: positive for dyspnea on exertion Cardiovascular: negative Gastrointestinal: negative Genitourinary:negative Integument/breast: negative Hematologic/lymphatic: negative Musculoskeletal:negative Neurological: negative Behavioral/Psych: negative Endocrine: negative Allergic/Immunologic: negative   PHYSICAL EXAMINATION: General appearance: alert, cooperative, fatigued and no distress Head: Normocephalic, without obvious abnormality, atraumatic Neck: no adenopathy, no JVD, supple, symmetrical, trachea midline and thyroid not enlarged, symmetric, no tenderness/mass/nodules Lymph nodes: Cervical, supraclavicular, and axillary nodes normal. Resp: clear to auscultation bilaterally Back: symmetric, no curvature. ROM normal. No CVA tenderness. Cardio: regular rate and rhythm, S1, S2 normal, no murmur, click, rub or gallop GI: soft, non-tender; bowel sounds normal; no masses,  no organomegaly Extremities: extremities normal, atraumatic, no cyanosis or edema Neurologic: Alert and oriented X 3, normal strength and tone. Normal symmetric reflexes. Normal coordination and gait  ECOG PERFORMANCE STATUS: 1 - Symptomatic but completely  ambulatory  Blood pressure 130/78, pulse 66, temperature 97.8 F (36.6 C), temperature source Tympanic, resp. rate 18, height 6' (1.829 m), weight 146 lb 11.2 oz (66.5 kg), SpO2 100 %.  LABORATORY DATA: Lab Results  Component Value Date   WBC 15.9 (H) 04/17/2020   HGB 13.1 04/17/2020   HCT 38.4 (L) 04/17/2020   MCV 81.0 04/17/2020   PLT 210 04/17/2020      Chemistry      Component Value Date/Time   NA 140 04/06/2020 0908   K 4.3 04/06/2020 0908   CL 105 04/06/2020 0908   CO2 30 04/06/2020 0908   BUN 8 04/06/2020 0908   CREATININE 0.93 04/06/2020 0908   CREATININE 0.88 02/21/2020 1115      Component Value Date/Time   CALCIUM 9.3 04/06/2020 0908   ALKPHOS 52 04/06/2020 0908   AST 18 04/06/2020 0908   ALT 11 04/06/2020 0908   BILITOT 0.6 04/06/2020 0908       RADIOGRAPHIC STUDIES: MR Brain W Wo Contrast  Result Date: 04/12/2020 CLINICAL DATA:  Metastatic lung cancer. Stereotactic radio surgery treatment planning. EXAM: MRI HEAD WITHOUT AND WITH CONTRAST TECHNIQUE: Multiplanar, multiecho pulse sequences of the brain and surrounding structures were obtained without and with intravenous contrast. CONTRAST:  75mL MULTIHANCE GADOBENATE DIMEGLUMINE 529 MG/ML IV SOLN COMPARISON:  MRI head 04/07/2020 FINDINGS: Brain: 3 metastatic deposits in the brain are noted. 3 mm enhancing lesion right lateral sella cerebellum axial  image 34 9 mm enhancing lesion in the superior cerebellar vermis axial image 36. Mild surrounding edema 4 mm enhancing lesion left parietal cortex axial image 105 with minimal edema. Negative for acute infarct. Negative for hemorrhage. Mild white matter changes consistent with chronic microvascular ischemia. Vascular: Normal arterial flow voids Skull and upper cervical spine: No focal skeletal lesion. Paranasal sinuses clear.  Mastoid clear.  Normal orbit 24 mm enhancing mass in the right parotid with central nonenhancing tissue. Sinuses/Orbits: Paranasal sinuses clear.   Negative orbit Other: 24 mm enhancing mass right parotid with central nonenhancing tissue. IMPRESSION: Three metastatic deposits in the brain as above. No significant mass-effect or midline shift. No intracranial hemorrhage. 24 mm enhancing mass right parotid. This has irregular enhancement and is compatible with neoplasm which could be malignant. Recommend CT neck with contrast for further evaluation. It is notable that this mass does not show hypermetabolic uptake on PET. Electronically Signed   By: Franchot Gallo M.D.   On: 04/12/2020 15:32   MR BRAIN W WO CONTRAST  Result Date: 04/07/2020 CLINICAL DATA:  Non-small cell lung cancer staging. EXAM: MRI HEAD WITHOUT AND WITH CONTRAST TECHNIQUE: Multiplanar, multiecho pulse sequences of the brain and surrounding structures were obtained without and with intravenous contrast. CONTRAST:  5mL GADAVIST GADOBUTROL 1 MMOL/ML IV SOLN COMPARISON:  None. FINDINGS: Brain: No acute infarct. No hydrocephalus. No acute hemorrhage. Scattered T2/FLAIR hyperintensities within the white matter, compatible with chronic microvascular ischemic disease. There is an approximately 10 x 7 mm enhancing lesion involving the cerebellar vermis (see series 16, image 38 and series 18, image 14).There is mild surrounding edema without substantial mass effect. The fourth ventricle is widely patent. Approximately 3 mm focus of enhancement in the lateral right cerebellar hemisphere (series 16, image 39 and series 18, image 7) Approximately 5 mm focus of enhancement in the left parieto-occipital region (series 16, image 108 and series 18, image 20) with mild surrounding edema. Additional punctate apparent enhancement in the right cerebellar hemisphere is only seen on the coronal sequence (series 17, image 10). Vascular: Major proximal arterial flow voids are maintained at the skull base. Skull and upper cervical spine: Normal marrow signal. Sinuses/Orbits: Right maxillary sinus retention cyst.  Unremarkable orbits. Other: No mastoid effusions. IMPRESSION: 1. Approximately 10 mm enhancing lesion involving the cerebellar vermis, 3 mm enhancing lesion in the right cerebellar hemisphere, and 5 mm enhancing lesion in the left parieto-occiptail region, concerning for metastases given the clinical history. There is mild surrounding edema without substantial mass effect. The fourth ventricle is widely patent. 2. Punctate apparent enhancement in the right cerebellar hemisphere is only seen on the coronal sequence (series 17, image 10). While this may be artifactual, it warrants attention on follow-up. These results will be called to the ordering clinician or representative by the Radiologist Assistant, and communication documented in the PACS or [employer]. Electronically Signed   By: Margaretha Sheffield MD   On: 04/07/2020 14:32   NM PET Image Initial (PI) Skull Base To Thigh  Result Date: 04/02/2020 CLINICAL DATA:  Is initial treatment strategy for pulmonary lesions suspicious for multifocal adenocarcinoma. EXAM: NUCLEAR MEDICINE PET SKULL BASE TO THIGH TECHNIQUE: 7.3 mCi F-18 FDG was injected intravenously. Full-ring PET imaging was performed from the skull base to thigh after the radiotracer. CT data was obtained and used for attenuation correction and anatomic localization. Fasting blood glucose: 91 mg/dl COMPARISON:  CT of the chest from March 27, 2020 FINDINGS: Mediastinal blood pool activity: SUV max  1.67 Liver activity: SUV max NA NECK: Diffuse brown fat activity throughout the neck and chest. This is present in the supraclavicular region bilaterally and in the supraclavicular fossa extending posteriorly as well as associated with paraspinous uptake. No focal nodal enlargement or hypermetabolic lymph nodes in the neck. Incidental CT findings: Cervical spinal fusion with some streak artifact. CHEST: Pulmonary lesions with increased FDG uptake. RIGHT upper lobe mass (image 31, series 8 and image  74 of series 4) not changed from the CT of 3 days ago. (SUVmax = 9.7 LEFT lower lobe cystic and solid area with adjacent septal thickening, compatible with LEFT lower lobe mass (SUVmax = 5.1) Part solid nodule in the RIGHT lower lobe (image 89 of series 4) also unchanged (SUVmax = 2.5) Part solid, mainly solid nodule just posterior to the major fissure and hilar structures in the RIGHT chest in the RIGHT lower lobe (image 33, series 8) 1.5 cm with mildly increased FDG uptake (SUVmax = 2.8) Low level metabolic activity present and smaller pulmonary nodules. Spiculated nodule in the RIGHT lung apex (image 14, series 8) SUV value less than mediastinal blood pool but suspicious based on morphologic features. Other areas of ground-glass and nodularity unchanged from recent CT imaging of the chest none with metabolic uptake exceeding the dominant areas in the RIGHT upper and LEFT lower lobes. RIGHT hilar adenopathy on image 75 of series 4 (SUVmax = 9.2) this may represent adjacent juxta hilar lymph nodes. Subcarinal nodal tissue 13 mm short axis with intense metabolic activity (SUVmax = 10.2) Precarinal nodal tissue with increased metabolic activity of similar intensity. High RIGHT paratracheal lymph node (image 63 of series 4) 1 cm (SUVmax = 7.7) LEFT para esophageal activity without corresponding lymph node or soft tissue likely related to brown fat activity (image 64 of series 4) no LEFT hilar uptake Incidental CT findings: Calcified atheromatous plaque of the thoracic aorta similar to previous imaging. No pericardial effusion. Esophagus grossly normal. Background pulmonary emphysema ABDOMEN/PELVIS: No abnormal hypermetabolic activity within the liver, pancreas, adrenal glands, or spleen. No hypermetabolic lymph nodes in the abdomen or pelvis. Incidental CT findings: Liver unremarkable. Gallbladder grossly normal. Normal contour the spleen, normal splenic size. Adrenal glands with normal size. No hydronephrosis.  Circumferential thickening of the urinary bladder nonspecific given collapse of the urinary bladder. No acute gastrointestinal process. The appendix is normal. Abdominal aortic atherosclerosis. SKELETON: No focal hypermetabolic activity to suggest skeletal metastasis. Incidental CT findings: Cervical spinal fusion from C5 through C7. IMPRESSION: 1. Markedly hyperintense RIGHT lung mass with spiculated margins tethering the fissure in the posterior RIGHT upper lobe. 2. LEFT lower lobe mass displaying slightly less FDG uptake. 3. RIGHT hilar and mediastinal lymphadenopathy compatible with nodal involvement from primary lung cancer. 4. Smaller solid and part solid nodules also with variable degrees of FDG uptake but with less FDG uptake than dominant lesions remaining concerning for multifocal adenocarcinoma. 5. No definite disease beyond the chest and no signs of abdominal or pelvic involvement. 6. Extensive brown fat activation mildly limits assessment of supraclavicular regions and neck. 7. Pulmonary emphysema. 8. Aortic atherosclerosis. Aortic Atherosclerosis (ICD10-I70.0) and Emphysema (ICD10-J43.9). Electronically Signed   By: Zetta Bills M.D.   On: 04/02/2020 15:58   DG CHEST PORT 1 VIEW  Result Date: 03/30/2020 CLINICAL DATA:  Bronchial brushings and biopsies EXAM: PORTABLE CHEST 1 VIEW COMPARISON:  CT chest 03/19/2020, chest radiograph 06/27/2011 FINDINGS: Masslike areas of consolidation within the right upper lobe, right lower lobe, and left lower lobe, compatible  with masses that were better characterized on recent CT chest. There are surrounding hazy opacities, greatest in the right upper lobe. No discernible pneumothorax on this portable semi erect AP radiograph. No evidence of acute osseous abnormality. Partially imaged cervical ACDF. IMPRESSION: 1. Masslike areas of consolidation within the right upper lobe, right lower lobe, and left lower lobe, compatible with masses that were better  characterized on recent CT chest. 2. Hazy surrounding opacities, greatest in the right upper lobe are nonspecific, but could relate to pneumonia, aspiration, or hemorrhage. Evaluation of chronicity is limited without recent radiographs for comparison. 3. No discernible pneumothorax on this portable semi erect AP radiograph. Electronically Signed   By: Margaretha Sheffield MD   On: 03/30/2020 10:28   CT CHEST LUNG CA SCREEN LOW DOSE W/O CM  Result Date: 03/20/2020 CLINICAL DATA:  64 year old male former smoker, quit 1 month ago, with 84 pack-year history of smoking, for initial lung cancer screening EXAM: CT CHEST WITHOUT CONTRAST LOW-DOSE FOR LUNG CANCER SCREENING TECHNIQUE: Multidetector CT imaging of the chest was performed following the standard protocol without IV contrast. COMPARISON:  None. FINDINGS: Cardiovascular: The heart is normal in size. No pericardial effusion. No evidence of thoracic aortic aneurysm. Mild atherosclerotic calcifications of the aortic arch. Mediastinum/Nodes: 12 mm short axis subcarinal node (series 2/image 31). Fullness of the right hilar region, favoring right hilar lymphadenopathy, although poorly evaluated on unenhanced CT. Visualized thyroid is unremarkable. Lungs/Pleura: 2.4 x 4.9 x 2.5 cm spiculated mass in the posterior right upper lobe (series 3/image 141). Mass involves the right lateral chest wall (series 3/image 138) and right major fissure (series 3/image 145). 2.5 x 2.3 x 2.0 cm subsolid nodule in the right lower lobe (series 3/image 208), with associated 18 mm solid component (series 3/image 202). 4.5 x 3.8 x 7.0 cm mixed cystic/solid mass in the posterolateral left lower lobe (series 3/image 271). Additional scattered subsolid, cavitary, and ground-glass nodules in the lungs bilaterally. This appearance is considered highly suspicious for multifocal adenocarcinoma. Mild to moderate centrilobular and paraseptal emphysematous changes, upper lung predominant. No focal  consolidation. No pleural effusion or pneumothorax. Upper Abdomen: Visualized upper abdomen is grossly unremarkable, noting vascular calcifications Musculoskeletal: No suspicious osseous lesions. Cervical spine fixation hardware, incompletely visualized. IMPRESSION: Lung-RADS 4X, highly suspicious. Additional imaging evaluation or consultation with Pulmonology or Thoracic Surgery recommended. 4.9 cm spiculated solid mass in the posterior right upper lobe. 2.5 cm subsolid nodule in the right lower lobe with 18 mm solid component. 7.0 cm mixed cystic/solid mass in the posterolateral left lower lobe. These findings are highly suspicious for multifocal adenocarcinoma. Additional scattered nodules in the lungs bilaterally. Consider PET-CT further evaluation as clinically warranted. Aortic Atherosclerosis (ICD10-I70.0) and Emphysema (ICD10-J43.9). Electronically Signed   By: Julian Hy M.D.   On: 03/20/2020 08:34   CT Super D Chest Wo Contrast  Result Date: 03/27/2020 CLINICAL DATA:  Pulmonary nodules.  Preprocedure mapping. EXAM: CT CHEST WITHOUT CONTRAST TECHNIQUE: Multidetector CT imaging of the chest was performed using thin slice collimation for electromagnetic bronchoscopy planning purposes, without intravenous contrast. COMPARISON:  03/19/2020 FINDINGS: Cardiovascular: The heart size is normal. No substantial pericardial effusion. Atherosclerotic calcification is noted in the wall of the thoracic aorta. Mediastinum/Nodes: 9 mm short axis paratracheal lymph node on 48/2 is similar. 12 mm short axis subcarinal lymph node measured previously is stable. Fullness noted right hilar region. The esophagus has normal imaging features. There is no axillary lymphadenopathy. Lungs/Pleura: Multiple irregular/spiculated pulmonary nodules again noted as characterized on  lung cancer screening CT 8 days ago. Dominant lesion in the posterior right upper lobe tethers the major fissure and measures 4.6 x 2.3 cm. Other  scattered smaller lesions are seen in the posterior right upper lobe retro hilar right lower lobe, right middle lobe, inferior right lower lobe and posterior left lower lobe. Upper Abdomen: Unremarkable. Musculoskeletal: No worrisome lytic or sclerotic osseous abnormality. Sclerotic focus in the T10 vertebral body is stable, likely benign. IMPRESSION: Multiple bilateral spiculated solid and sub solid pulmonary nodules and masses, as characterized on recent lung cancer screening CT. Imaging features concerning for multifocal adenocarcinoma. Borderline to mild mediastinal lymphadenopathy with fullness in the right hilar region. Metastatic disease a concern. Electronically Signed   By: Misty Stanley M.D.   On: 03/27/2020 10:33   DG C-ARM BRONCHOSCOPY  Result Date: 03/30/2020 C-ARM BRONCHOSCOPY: Fluoroscopy was utilized by the requesting physician.  No radiographic interpretation.    ASSESSMENT AND PLAN: This is a very pleasant 64 years old African-American male recently diagnosed with a stage IV (T3, N2, M1c)  non-small cell lung cancer, adenocarcinoma presented with right upper lobe lung mass in addition to right lower lobe, left lower lobe in addition to right hilar and mediastinal lymphadenopathy in addition to metastatic brain lesions diagnosed in October 2021. The patient has molecular studies by [employer] that showed no actionable mutations. He is expected to have [place] treatment to these 3 brain metastasis under the care of Dr. Lisbeth Renshaw on April 20, 2020.  He is currently on a tapered dose of Decadron 4 mg p.o. twice daily.  I advised the patient to decrease his dose to 4 mg p.o. daily after the [facility]. I had a lengthy discussion with the patient today about his current disease stage, prognosis and treatment options. In the absence of any actionable mutation, I gave the patient the option of palliative care versus palliative systemic chemotherapy with carboplatin for AUC of 5, Alimta 500 mg/M2 and  Keytruda 200 mg IV every 3 weeks The patient is interested in proceeding with treatment. I discussed with him the adverse effect of this treatment including but not limited to alopecia, myelosuppression, nausea and vomiting, peripheral neuropathy, liver or renal dysfunction as well as immunotherapy adverse effects. He is expected to start the first cycle of this treatment next week. I will arrange for the patient to have a Port-A-Cath placement before his treatment. I will arrange for the patient to have a chemotherapy education class before the first dose of treatment. I will call his pharmacy with prescription for Compazine 10 mg p.o. every 6 hours as needed for nausea, folic acid 1 mg p.o. daily in addition to EMLA cream to be applied to the Port-A-Cath site before treatment. The patient will come back for follow-up visit in 2 weeks for evaluation and management of any adverse effect of his treatment. He was advised to call immediately if he has any other concerning symptoms in the interval. The patient voices understanding of current disease status and treatment options and is in agreement with the current care plan.  All questions were answered. The patient knows to call the clinic with any problems, questions or concerns. We can certainly see the patient much sooner if necessary.  The total time spent in the appointment was 50 minutes.  Disclaimer: This note was dictated with voice recognition software. Similar sounding words can inadvertently be transcribed and may not be corrected upon review.

## 2020-04-18 ENCOUNTER — Other Ambulatory Visit: Payer: Self-pay

## 2020-04-18 ENCOUNTER — Telehealth: Payer: Self-pay | Admitting: Internal Medicine

## 2020-04-18 ENCOUNTER — Inpatient Hospital Stay: Payer: 59

## 2020-04-18 ENCOUNTER — Telehealth: Payer: Self-pay | Admitting: Radiation Oncology

## 2020-04-18 DIAGNOSIS — C778 Secondary and unspecified malignant neoplasm of lymph nodes of multiple regions: Secondary | ICD-10-CM | POA: Insufficient documentation

## 2020-04-18 DIAGNOSIS — C7931 Secondary malignant neoplasm of brain: Secondary | ICD-10-CM | POA: Diagnosis present

## 2020-04-18 DIAGNOSIS — C3432 Malignant neoplasm of lower lobe, left bronchus or lung: Secondary | ICD-10-CM | POA: Insufficient documentation

## 2020-04-18 DIAGNOSIS — C3431 Malignant neoplasm of lower lobe, right bronchus or lung: Secondary | ICD-10-CM | POA: Diagnosis not present

## 2020-04-18 DIAGNOSIS — C3411 Malignant neoplasm of upper lobe, right bronchus or lung: Secondary | ICD-10-CM | POA: Diagnosis present

## 2020-04-18 DIAGNOSIS — Z87891 Personal history of nicotine dependence: Secondary | ICD-10-CM | POA: Diagnosis not present

## 2020-04-18 DIAGNOSIS — Z510 Encounter for antineoplastic radiation therapy: Secondary | ICD-10-CM | POA: Diagnosis present

## 2020-04-18 NOTE — Telephone Encounter (Signed)
-----  Message from Freeman Caldron, [place] sent at 04/17/2020 11:50 AM EDT ----- Aldona Bar, Will you please contact the patient to let him know that we informed Dr. Julien Nordmann of the parotid mass (I told patient about the finding when I consented him on Friday) on recent MRI and he recommends just monitoring it so he will follow this closely on future scans. Thank you!!! -Ashlyn ----- Message ----- From: Curt Bears, MD Sent: 04/13/2020   5:56 PM EDT To: Freeman Caldron, PA-C, #  We will monitor for now.  Thank you. ----- Message ----- From: Freeman Caldron, PA-C Sent: 04/13/2020  11:56 AM EDT To: Curt Bears, MD, #  Hi team! 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 This did not enhance on PET scan but radiology has recommended follow up with CT head/neck so Dr. Lisbeth Renshaw wanted me to make you all aware so that you could determine whether further imaging/evaluation is warranted. I did share the finding with the patient when I met with him and his wife for CT SIM this morning and they understand that if further evaluation is necessary, you all will call them to coordinate. He is scheduled for a single fraction of SRS with [facility] on Friday 04/20/20. Happy Friday! -Ashlyn

## 2020-04-18 NOTE — Telephone Encounter (Signed)
Release: [id number] Faxed medical records to [facility] @ fax#[phone]

## 2020-04-18 NOTE — Progress Notes (Signed)
Pharmacist Chemotherapy Monitoring - Initial Assessment    Anticipated start date: 04/24/20   Regimen:  . Are orders appropriate based on the patient's diagnosis, regimen, and cycle? Yes . Does the plan date match the patient's scheduled date? Yes . Is the sequencing of drugs appropriate? Yes . Are the premedications appropriate for the patient's regimen? Yes . Prior Authorization for treatment is: Not Started o If applicable, is the correct biosimilar selected based on the patient's insurance? not applicable  Organ Function and Labs: Marland Kitchen Are dose adjustments needed based on the patient's renal function, hepatic function, or hematologic function? No . Are appropriate labs ordered prior to the start of patient's treatment? Yes . Other organ system assessment, if indicated: N/A . The following baseline labs, if indicated, have been ordered: pembrolizumab: baseline TSH +/- T4  Dose Assessment: . Are the drug doses appropriate? Yes . Are the following correct: o Drug concentrations Yes o IV fluid compatible with drug Yes o Administration routes Yes o Timing of therapy Yes . If applicable, does the patient have documented access for treatment and/or plans for port-a-cath placement? no . If applicable, have lifetime cumulative doses been properly documented and assessed? yes Lifetime Dose Tracking  No doses have been documented on this patient for the following tracked chemicals: Doxorubicin, Epirubicin, Idarubicin, Daunorubicin, Mitoxantrone, Bleomycin, Oxaliplatin, Carboplatin, Liposomal Doxorubicin  o   Toxicity Monitoring/Prevention: . The patient has the following take home antiemetics prescribed: Prochlorperazine and Dexamethasone . The patient has the following take home medications prescribed: B12 for pemetrexed and folic acid for pemetrexed . Medication allergies and previous infusion related reactions, if applicable, have been reviewed and addressed. Yes . The patient's current  medication list has been assessed for drug-drug interactions with their chemotherapy regimen. no significant drug-drug interactions were identified on review.  Order Review: . Are the treatment plan orders signed? Yes . Is the patient scheduled to see a provider prior to their treatment? No  I verify that I have reviewed each item in the above checklist and answered each question accordingly.   Kennith Center, Pharm.D., CPP 04/18/2020@3 :07 PM

## 2020-04-18 NOTE — Telephone Encounter (Signed)
Phoned patient as requested by Freeman Caldron, PA-C. Explained that she informed Dr. Julien Nordmann of the parotid mass (the one she told him about Friday) and he recommends just monitoring it with future scans. Patient states, "is that the spot in my throat." Confirmed it was. Patient verbalized understanding and expressed appreciation for the call.

## 2020-04-18 NOTE — Telephone Encounter (Signed)
Opened in error

## 2020-04-19 ENCOUNTER — Telehealth: Payer: Self-pay | Admitting: Internal Medicine

## 2020-04-19 ENCOUNTER — Other Ambulatory Visit: Payer: 59

## 2020-04-19 DIAGNOSIS — C7931 Secondary malignant neoplasm of brain: Secondary | ICD-10-CM

## 2020-04-19 LAB — FUNGUS CULTURE WITH STAIN

## 2020-04-19 LAB — FUNGUS CULTURE RESULT

## 2020-04-19 LAB — FUNGAL ORGANISM REFLEX

## 2020-04-19 NOTE — Telephone Encounter (Signed)
Scheduled per los. Called and spoke with patient. Confirmed

## 2020-04-20 ENCOUNTER — Ambulatory Visit
Admission: RE | Admit: 2020-04-20 | Discharge: 2020-04-20 | Disposition: A | Discharge status: Home / Self Care | Payer: 59 | Source: Ambulatory Visit | Attending: Radiation Oncology | Admitting: Radiation Oncology

## 2020-04-20 ENCOUNTER — Other Ambulatory Visit: Payer: Self-pay | Admitting: Radiology

## 2020-04-20 ENCOUNTER — Ambulatory Visit: Payer: 59

## 2020-04-20 ENCOUNTER — Encounter: Payer: Self-pay | Admitting: Radiation Oncology

## 2020-04-20 ENCOUNTER — Ambulatory Visit: Payer: 59 | Admitting: Radiation Oncology

## 2020-04-20 DIAGNOSIS — Z510 Encounter for antineoplastic radiation therapy: Secondary | ICD-10-CM | POA: Diagnosis not present

## 2020-04-20 NOTE — Progress Notes (Signed)
[name] rested with [name] 30 minutes following his SRS treatment.  Patient denies headache, dizziness, nausea, diplopia or ringing in the ears. Denies fatigue. Patient without complaints. Understands to avoid strenuous activity for the next 24 hours and call [phone] with needs. Patient was given steroid taper instructions.  See separate note.  BP 138/60 (BP Location: Right Arm, Patient Position: Sitting, Cuff Size: Normal)   Pulse 89   Temp 97.8 F (36.6 C)   Resp 18   SpO2 100%   Nicolas Allen, BSN

## 2020-04-20 NOTE — Op Note (Signed)
  Name: Nicolas Allen  MRN: [id number]  Date: 04/20/2020   DOB: 01-01-1956  Stereotactic Radiosurgery Operative Note  PRE-OPERATIVE DIAGNOSIS:  Multiple Brain Metastases  POST-OPERATIVE DIAGNOSIS:  Multiple Brain Metastases  PROCEDURE:  Stereotactic Radiosurgery  SURGEON:  Charlie Pitter, MD  NARRATIVE: The patient underwent a radiation treatment planning session in the radiation oncology simulation suite under the care of the radiation oncology physician and physicist.  I participated closely in the radiation treatment planning afterwards. The patient underwent planning CT which was fused to 3T high resolution MRI with 1 mm axial slices.  These images were fused on the planning system.  We contoured the gross target volumes and subsequently expanded this to yield the Planning Target Volume. I actively participated in the planning process.  I helped to define and review the target contours and also the contours of the optic pathway, eyes, brainstem and selected nearby organs at risk.  All the dose constraints for critical structures were reviewed and compared to AAPM Task Group 101.  The prescription dose conformity was reviewed.  I approved the plan electronically.    Accordingly, [name] was brought to the TrueBeam stereotactic radiation treatment linac and placed in the custom immobilization mask.  The patient was aligned according to the IR fiducial markers with BrainLab Exactrac, then orthogonal x-rays were used in ExacTrac with the 6DOF robotic table and the shifts were made to align the patient  [name] received stereotactic radiosurgery uneventfully.    Lesions treated:  3   Complex lesions treated:  0 (>3.5 cm, <30mm of optic path, or within the brainstem)   The detailed description of the procedure is recorded in the radiation oncology procedure note.  I was present for the duration of the procedure.  DISPOSITION:  Following delivery, the patient was transported to  nursing in stable condition and monitored for possible acute effects to be discharged to home in stable condition with follow-up in one month.  Charlie Pitter, MD 04/20/2020 2:51 PM

## 2020-04-20 NOTE — Progress Notes (Signed)
  Radiation Oncology         [phone]) [phone] ________________________________  Name: Nicolas Allen  ZCH:[id number]  Date of Service: 04/20/2020  DOB: 1956-06-04   Steroid Taper Instructions   You currently have a prescription for Dexamethasone 4 mg Tablets.   Beginning 04/21/2020 Take a 4 mg tablet once a day for one week.  Beginning 04/28/2020: Take 1/2 of a tablet (which is 2 mg) once a day for one week.  Beginning 05/05/2020: Take 1/2 of a tablet (which is 2 mg) every other day and stop on 05/13/2020   Please call our office if you have any headaches, visual changes, uncontrolled movements, nausea or vomiting.   Patient and wife given instructions and verbalized understanding.  Patient able to give teach back.

## 2020-04-23 ENCOUNTER — Encounter (HOSPITAL_COMMUNITY): Payer: Self-pay

## 2020-04-23 ENCOUNTER — Other Ambulatory Visit: Payer: Self-pay

## 2020-04-23 ENCOUNTER — Ambulatory Visit (HOSPITAL_COMMUNITY)
Admission: RE | Admit: 2020-04-23 | Discharge: 2020-04-23 | Disposition: A | Discharge status: Home / Self Care | Payer: 59 | Source: Ambulatory Visit | Attending: Internal Medicine | Admitting: Internal Medicine

## 2020-04-23 ENCOUNTER — Other Ambulatory Visit: Payer: Self-pay | Admitting: Internal Medicine

## 2020-04-23 DIAGNOSIS — C3491 Malignant neoplasm of unspecified part of right bronchus or lung: Secondary | ICD-10-CM

## 2020-04-23 DIAGNOSIS — C3490 Malignant neoplasm of unspecified part of unspecified bronchus or lung: Secondary | ICD-10-CM | POA: Diagnosis present

## 2020-04-23 DIAGNOSIS — C7931 Secondary malignant neoplasm of brain: Secondary | ICD-10-CM | POA: Insufficient documentation

## 2020-04-23 DIAGNOSIS — Z87891 Personal history of nicotine dependence: Secondary | ICD-10-CM | POA: Insufficient documentation

## 2020-04-23 HISTORY — PX: IR IMAGING GUIDED PORT INSERTION: IMG5740

## 2020-04-23 MED ORDER — FENTANYL CITRATE (PF) 100 MCG/2ML IJ SOLN
INTRAMUSCULAR | Status: AC
  Filled 2020-04-23: qty 2

## 2020-04-23 MED ORDER — FENTANYL CITRATE (PF) 100 MCG/2ML IJ SOLN
INTRAMUSCULAR | Status: AC | PRN
Start: 2020-04-23 — End: 2020-04-23
  Administered 2020-04-23 (×2): 50 ug via INTRAVENOUS

## 2020-04-23 MED ORDER — CEFAZOLIN SODIUM-DEXTROSE 2-4 GM/100ML-% IV SOLN
INTRAVENOUS | Status: AC
  Administered 2020-04-23: 2 g via INTRAVENOUS
  Filled 2020-04-23: qty 100

## 2020-04-23 MED ORDER — HEPARIN SOD (PORK) LOCK FLUSH 100 UNIT/ML IV SOLN
INTRAVENOUS | Status: AC
  Filled 2020-04-23: qty 5

## 2020-04-23 MED ORDER — LIDOCAINE-EPINEPHRINE 1 %-1:100000 IJ SOLN
INTRAMUSCULAR | Status: AC
  Filled 2020-04-23: qty 1

## 2020-04-23 MED ORDER — SODIUM CHLORIDE 0.9 % IV SOLN: INTRAVENOUS | Status: DC

## 2020-04-23 MED ORDER — LIDOCAINE-EPINEPHRINE (PF) 1 %-1:200000 IJ SOLN
INTRAMUSCULAR | Status: AC | PRN
  Administered 2020-04-23 (×2): 10 mL via INTRADERMAL

## 2020-04-23 MED ORDER — CEFAZOLIN SODIUM-DEXTROSE 2-4 GM/100ML-% IV SOLN: 2.0000 g | Freq: Once | INTRAVENOUS | Status: AC

## 2020-04-23 MED ORDER — MIDAZOLAM HCL 2 MG/2ML IJ SOLN
INTRAMUSCULAR | Status: AC
  Filled 2020-04-23: qty 4

## 2020-04-23 MED ORDER — HEPARIN SOD (PORK) LOCK FLUSH 100 UNIT/ML IV SOLN
INTRAVENOUS | Status: AC | PRN
  Administered 2020-04-23: 500 [IU] via INTRAVENOUS

## 2020-04-23 MED ORDER — MIDAZOLAM HCL 2 MG/2ML IJ SOLN
INTRAMUSCULAR | Status: AC | PRN
  Administered 2020-04-23 (×3): 1 mg via INTRAVENOUS

## 2020-04-23 NOTE — Procedures (Signed)
Interventional Radiology Procedure Note  Procedure: Placement of a right IJ approach single lumen PowerPort.  Tip is positioned at the superior cavoatrial junction and catheter is ready for immediate use.  Complications: No immediate Recommendations:  - Ok to shower tomorrow - Do not submerge for 7 days - Routine line care   Signed,  Shavaun Osterloh K. Alliene Klugh, MD   

## 2020-04-23 NOTE — Discharge Instructions (Signed)
Do not use lidocaine cream on your new port until it has healed. The petroleum in the cream will dissolve the skin glue resulting in an infection of your new port. Use ice in a zip lock bag for 1-2 minutes prior to nurses accessing your new port    Implanted Port Insertion, Care After This sheet gives you information about how to care for yourself after your procedure. Your health care provider may also give you more specific instructions. If you have problems or questions, contact your health care provider. What can I expect after the procedure? After the procedure, it is common to have:  Discomfort at the port insertion site.  Bruising on the skin over the port. This should improve over 3-4 days. Follow these instructions at home: [facility] care  After your port is placed, you will get a manufacturer's information card. The card has information about your port. Keep this card with you at all times.  Take care of the port as told by your health care provider. Ask your health care provider if you or a family member can get training for taking care of the port at home. A home health care nurse may also take care of the port.  Make sure to remember what type of port you have. Incision care      Follow instructions from your health care provider about how to take care of your port insertion site. Make sure you: ? Wash your hands with soap and water before and after you change your bandage (dressing). If soap and water are not available, use hand sanitizer. ? Change your dressing as told by your health care provider.  Leave  skin glue in place. These skin closures may need to stay in place for 2 weeks or longer  Check your port insertion site every day for signs of infection. Check for: ? Redness, swelling, or pain. ? Fluid or blood. ? Warmth. ? Pus or a bad smell. Activity  Return to your normal activities as told by your health care provider. Ask your health care provider what  activities are safe for you.  Do not lift anything that is heavier than 10 lb (4.5 kg), or the limit that you are told, until your health care provider says that it is safe. General instructions  Take over-the-counter and prescription medicines only as told by your health care provider.  Do not take baths, swim, or use a hot tub until your health care provider approves. You may remove the gauze and transparent dressing over your port tomorrow around 3 PM and shower.   Do not drive for 24 hours if you were given a sedative during your procedure.  Wear a medical alert bracelet in case of an emergency. This will tell any health care providers that you have a port.  Keep all follow-up visits as told by your health care provider. This is important. Contact a health care provider if:  You cannot flush your port with saline as directed, or you cannot draw blood from the port.  You have a fever or chills.  You have redness, swelling, or pain around your port insertion site.  You have fluid or blood coming from your port insertion site.  Your port insertion site feels warm to the touch.  You have pus or a bad smell coming from the port insertion site. Get help right away if:  You have chest pain or shortness of breath.  You have bleeding from your port that you  cannot control. Summary  Take care of the port as told by your health care provider. Keep the manufacturer's information card with you at all times.  Change your dressing as told by your health care provider.  Contact a health care provider if you have a fever or chills or if you have redness, swelling, or pain around your port insertion site.  Keep all follow-up visits as told by your health care provider. This information is not intended to replace advice given to you by your health care provider. Make sure you discuss any questions you have with your health care provider. Document Revised: 12/29/2017 Document Reviewed:  12/29/2017 Elsevier Patient Education  [place].     Moderate Conscious Sedation, Adult, Care After These instructions provide you with information about caring for yourself after your procedure. Your health care provider may also give you more specific instructions. Your treatment has been planned according to current medical practices, but problems sometimes occur. Call your health care provider if you have any problems or questions after your procedure. What can I expect after the procedure? After your procedure, it is common:  To feel sleepy for several hours.  To feel clumsy and have poor balance for several hours.  To have poor judgment for several hours.  To vomit if you eat too soon. Follow these instructions at home: For at least 24 hours after the procedure:   Do not: ? Participate in activities where you could fall or become injured. ? Drive. ? Use heavy machinery. ? Drink alcohol. ? Take sleeping pills or medicines that cause drowsiness. ? Make important decisions or sign legal documents. ? Take care of children on your own.  Rest. Eating and drinking  Follow the diet recommended by your health care provider.  If you vomit: ? Drink water, juice, or soup when you can drink without vomiting. ? Make sure you have little or no nausea before eating solid foods. General instructions  Have a responsible adult stay with you until you are awake and alert.  Take over-the-counter and prescription medicines only as told by your health care provider.  If you smoke, do not smoke without supervision.  Keep all follow-up visits as told by your health care provider. This is important. Contact a health care provider if:  You keep feeling nauseous or you keep vomiting.  You feel light-headed.  You develop a rash.  You have a fever. Get help right away if:  You have trouble breathing. This information is not intended to replace advice given to you by your  health care provider. Make sure you discuss any questions you have with your health care provider. Document Revised: 05/15/2017 Document Reviewed: 09/22/2015 Elsevier Patient Education  2020 [employer].

## 2020-04-23 NOTE — Consult Note (Signed)
Chief Complaint: Patient was seen in consultation today for port a cath placement  Referring Physician(s): Mohamed,Mohamed  Supervising Physician: Jacqulynn Cadet  Patient Status: [facility] - Out-pt  History of Present Illness: Nicolas Allen is a 64 y.o. male with history of recently diagnosed stage IV non-small cell lung cancer favoring adenocarcinoma who presented with right upper lobe lung mass in addition to right lower lobe/ left lower lobe and right hilar /mediastinal lymphadenopathy and metastatic brain lesions.  He presents today for Port-A-Cath placement for chemotherapy/immunotherapy.  He recently underwent SRS for multiple brain mets on 11/5.  Past Medical History:  Diagnosis Date  . Anemia 06/24/2012  . ANXIETY 02/03/2007  . Cervical disc disease 02/12/2012   S/p surgury jan 2013  . Erectile dysfunction 02/11/2011  . GERD (gastroesophageal reflux disease)   . GLUCOSE INTOLERANCE 01/04/2008  . HYPERLIPIDEMIA 02/03/2007  . HYPERTENSION 02/03/2007  . IBS (irritable bowel syndrome)   . Impaired glucose tolerance 02/11/2011  . Smoker 08/22/2014  . Substance abuse ([place]) 2001   sober for 16 yrs    Past Surgical History:  Procedure Laterality Date  . BRONCHIAL BIOPSY  03/30/2020   Procedure: BRONCHIAL BIOPSIES;  Surgeon: Garner Nash, DO;  Location: [place] [facility];  Service: Pulmonary;;  . BRONCHIAL BRUSHINGS  03/30/2020   Procedure: BRONCHIAL BRUSHINGS;  Surgeon: Garner Nash, DO;  Location: [place] [facility];  Service: Pulmonary;;  . BRONCHIAL NEEDLE ASPIRATION BIOPSY  03/30/2020   Procedure: BRONCHIAL NEEDLE ASPIRATION BIOPSIES;  Surgeon: Garner Nash, DO;  Location: [place] [facility];  Service: Pulmonary;;  . BRONCHIAL WASHINGS  03/30/2020   Procedure: BRONCHIAL WASHINGS;  Surgeon: Garner Nash, DO;  Location: [place] [facility];  Service: Pulmonary;;  . COLONOSCOPY  2007  . neck fusion  2012   C4  . NO PAST SURGERIES    . VIDEO BRONCHOSCOPY WITH ENDOBRONCHIAL NAVIGATION  N/A 03/30/2020   Procedure: VIDEO BRONCHOSCOPY WITH ENDOBRONCHIAL NAVIGATION;  Surgeon: Garner Nash, DO;  Location: [facility];  Service: Pulmonary;  Laterality: N/A;  . VIDEO BRONCHOSCOPY WITH ENDOBRONCHIAL ULTRASOUND N/A 03/30/2020   Procedure: VIDEO BRONCHOSCOPY WITH ENDOBRONCHIAL ULTRASOUND;  Surgeon: Garner Nash, DO;  Location: [place];  Service: Pulmonary;  Laterality: N/A;    Allergies: Patient has no known allergies.  Medications: Prior to Admission medications   Medication Sig Start Date End Date Taking? Authorizing Provider  amLODipine-benazepril (LOTREL) 10-20 MG capsule TAKE 1 CAPSULE BY MOUTH ONCE DAILY . APPOINTMENT REQUIRED FOR FUTURE REFILLS 04/09/20   [name], MD  aspirin EC 81 MG tablet Take 81 mg by mouth daily. Swallow whole.    [provider]  b complex vitamins tablet Take 1 tablet by mouth daily.    [provider]  dexamethasone (DECADRON) 4 MG tablet Take 1 tablet (4 mg total) by mouth 3 (three) times daily. 04/10/20   Icard, Octavio Graves, DO  folic acid (FOLVITE) 1 MG tablet Take 1 tablet (1 mg total) by mouth daily. 04/17/20   Curt Bears, MD  Garlic 1027 MG CAPS Take 1,000 mg by mouth daily.     [provider]  lidocaine-prilocaine (EMLA) cream Apply to the Port-A-Cath site 30-60 minutes before chemotherapy. 04/17/20   Curt Bears, MD  Omega-3 Fatty Acids (FISH OIL PO) Take 1 capsule by mouth daily.     [provider]  pravastatin (PRAVACHOL) 40 MG tablet TAKE 1 TABLET BY MOUTH ONCE DAILY . APPOINTMENT REQUIRED FOR FUTURE REFILLS 04/09/20   [name], MD  prochlorperazine (COMPAZINE) 10 MG tablet Take 1 tablet (10 mg total) by mouth every 6 (six) hours as needed for nausea or vomiting. 04/17/20   Curt Bears, MD  vardenafil (LEVITRA) 20 MG tablet TAKE 1 TABLET BY MOUTH AS NEEDED FOR  ERECTILE  DYSFUNCTION 02/24/20   [name], MD  Vitamin D, Ergocalciferol, (DRISDOL) 1.25 MG (50000 UNIT)  CAPS capsule Take 1 capsule (50,000 Units total) by mouth every 7 (seven) days. Patient taking differently: Take 50,000 Units by mouth every Sunday.  02/24/20   [name], MD     Family History  Problem Relation Age of Onset  . Cancer Mother        esophagus  . Cancer Cousin   . Stroke Other   . Hypertension Other   . Colon cancer Neg Hx     Social History   Socioeconomic History  . Marital status: Married    Spouse name: Not on file  . Number of children: Not on file  . Years of education: Not on file  . Highest education level: Not on file  Occupational History  . Occupation: janitor  Tobacco Use  . Smoking status: Former Smoker    Packs/day: 1.50    Years: 30.00    Pack years: 45.00    Types: Cigarettes    Quit date: 02/22/2020    Years since quitting: 0.1  . Smokeless tobacco: Former Systems developer    Types: Secondary school teacher  . Vaping Use: Never used  Substance and Sexual Activity  . Alcohol use: No    Alcohol/week: 0.0 standard drinks    Comment: quit 2000 prior heavy  . Drug use: No  . Sexual activity: Not on file  Other Topics Concern  . Not on file  Social History Narrative  . Not on file   Social Determinants of Health   Financial Resource Strain:   . Difficulty of Paying Living Expenses: Not on file  Food Insecurity:   . Worried About Charity fundraiser in the Last Year: Not on file  . Ran Out of Food in the Last Year: Not on file  Transportation Needs:   . Lack of Transportation (Medical): Not on file  . Lack of Transportation (Non-Medical): Not on file  Physical Activity:   . Days of Exercise per Week: Not on file  . Minutes of Exercise per Session: Not on file  Stress:   . Feeling of Stress : Not on file  Social Connections:   . Frequency of Communication with Friends and Family: Not on file  . Frequency of Social Gatherings with Friends and Family: Not on file  . Attends Religious Services: Not on file  . Active Member of Clubs or Organizations:  Not on file  . Attends Archivist Meetings: Not on file  . Marital Status: Not on file       Review of Systems currently denies fever, headache, chest pain, worsening dyspnea/cough, abdominal/back pain, nausea, vomiting or bleeding  Vital Signs: BP 120/72 (BP Location: Left Arm)   Pulse 76   Temp 97.8 F (36.6 C) (Oral)   Resp 16   SpO2 100%   Physical Exam awake, alert.  Chest clear to auscultation bilaterally.  Heart with regular rate and rhythm.  Abdomen soft, positive bowel sounds, nontender.  No lower extremity edema.  Imaging: MR Brain W Wo Contrast  Result Date: 04/12/2020 CLINICAL DATA:  Metastatic lung cancer. Stereotactic radio surgery treatment planning. EXAM: MRI HEAD WITHOUT  AND WITH CONTRAST TECHNIQUE: Multiplanar, multiecho pulse sequences of the brain and surrounding structures were obtained without and with intravenous contrast. CONTRAST:  53mL MULTIHANCE GADOBENATE DIMEGLUMINE 529 MG/ML IV SOLN COMPARISON:  MRI head 04/07/2020 FINDINGS: Brain: 3 metastatic deposits in the brain are noted. 3 mm enhancing lesion right lateral sella cerebellum axial image 34 9 mm enhancing lesion in the superior cerebellar vermis axial image 36. Mild surrounding edema 4 mm enhancing lesion left parietal cortex axial image 105 with minimal edema. Negative for acute infarct. Negative for hemorrhage. Mild white matter changes consistent with chronic microvascular ischemia. Vascular: Normal arterial flow voids Skull and upper cervical spine: No focal skeletal lesion. Paranasal sinuses clear.  Mastoid clear.  Normal orbit 24 mm enhancing mass in the right parotid with central nonenhancing tissue. Sinuses/Orbits: Paranasal sinuses clear.  Negative orbit Other: 24 mm enhancing mass right parotid with central nonenhancing tissue. IMPRESSION: Three metastatic deposits in the brain as above. No significant mass-effect or midline shift. No intracranial hemorrhage. 24 mm enhancing mass right  parotid. This has irregular enhancement and is compatible with neoplasm which could be malignant. Recommend CT neck with contrast for further evaluation. It is notable that this mass does not show hypermetabolic uptake on PET. Electronically Signed   By: Franchot Gallo M.D.   On: 04/12/2020 15:32   MR BRAIN W WO CONTRAST  Result Date: 04/07/2020 CLINICAL DATA:  Non-small cell lung cancer staging. EXAM: MRI HEAD WITHOUT AND WITH CONTRAST TECHNIQUE: Multiplanar, multiecho pulse sequences of the brain and surrounding structures were obtained without and with intravenous contrast. CONTRAST:  58mL GADAVIST GADOBUTROL 1 MMOL/ML IV SOLN COMPARISON:  None. FINDINGS: Brain: No acute infarct. No hydrocephalus. No acute hemorrhage. Scattered T2/FLAIR hyperintensities within the white matter, compatible with chronic microvascular ischemic disease. There is an approximately 10 x 7 mm enhancing lesion involving the cerebellar vermis (see series 16, image 38 and series 18, image 14).There is mild surrounding edema without substantial mass effect. The fourth ventricle is widely patent. Approximately 3 mm focus of enhancement in the lateral right cerebellar hemisphere (series 16, image 39 and series 18, image 7) Approximately 5 mm focus of enhancement in the left parieto-occipital region (series 16, image 108 and series 18, image 20) with mild surrounding edema. Additional punctate apparent enhancement in the right cerebellar hemisphere is only seen on the coronal sequence (series 17, image 10). Vascular: Major proximal arterial flow voids are maintained at the skull base. Skull and upper cervical spine: Normal marrow signal. Sinuses/Orbits: Right maxillary sinus retention cyst. Unremarkable orbits. Other: No mastoid effusions. IMPRESSION: 1. Approximately 10 mm enhancing lesion involving the cerebellar vermis, 3 mm enhancing lesion in the right cerebellar hemisphere, and 5 mm enhancing lesion in the left parieto-occiptail  region, concerning for metastases given the clinical history. There is mild surrounding edema without substantial mass effect. The fourth ventricle is widely patent. 2. Punctate apparent enhancement in the right cerebellar hemisphere is only seen on the coronal sequence (series 17, image 10). While this may be artifactual, it warrants attention on follow-up. These results will be called to the ordering clinician or representative by the Radiologist Assistant, and communication documented in the PACS or [employer]. Electronically Signed   By: Margaretha Sheffield MD   On: 04/07/2020 14:32   NM PET Image Initial (PI) Skull Base To Thigh  Result Date: 04/02/2020 CLINICAL DATA:  Is initial treatment strategy for pulmonary lesions suspicious for multifocal adenocarcinoma. EXAM: NUCLEAR MEDICINE PET SKULL BASE TO THIGH TECHNIQUE:  7.3 mCi F-18 FDG was injected intravenously. Full-ring PET imaging was performed from the skull base to thigh after the radiotracer. CT data was obtained and used for attenuation correction and anatomic localization. Fasting blood glucose: 91 mg/dl COMPARISON:  CT of the chest from March 27, 2020 FINDINGS: Mediastinal blood pool activity: SUV max 1.67 Liver activity: SUV max NA NECK: Diffuse brown fat activity throughout the neck and chest. This is present in the supraclavicular region bilaterally and in the supraclavicular fossa extending posteriorly as well as associated with paraspinous uptake. No focal nodal enlargement or hypermetabolic lymph nodes in the neck. Incidental CT findings: Cervical spinal fusion with some streak artifact. CHEST: Pulmonary lesions with increased FDG uptake. RIGHT upper lobe mass (image 31, series 8 and image 74 of series 4) not changed from the CT of 3 days ago. (SUVmax = 9.7 LEFT lower lobe cystic and solid area with adjacent septal thickening, compatible with LEFT lower lobe mass (SUVmax = 5.1) Part solid nodule in the RIGHT lower lobe (image 89 of  series 4) also unchanged (SUVmax = 2.5) Part solid, mainly solid nodule just posterior to the major fissure and hilar structures in the RIGHT chest in the RIGHT lower lobe (image 33, series 8) 1.5 cm with mildly increased FDG uptake (SUVmax = 2.8) Low level metabolic activity present and smaller pulmonary nodules. Spiculated nodule in the RIGHT lung apex (image 14, series 8) SUV value less than mediastinal blood pool but suspicious based on morphologic features. Other areas of ground-glass and nodularity unchanged from recent CT imaging of the chest none with metabolic uptake exceeding the dominant areas in the RIGHT upper and LEFT lower lobes. RIGHT hilar adenopathy on image 75 of series 4 (SUVmax = 9.2) this may represent adjacent juxta hilar lymph nodes. Subcarinal nodal tissue 13 mm short axis with intense metabolic activity (SUVmax = 10.2) Precarinal nodal tissue with increased metabolic activity of similar intensity. High RIGHT paratracheal lymph node (image 63 of series 4) 1 cm (SUVmax = 7.7) LEFT para esophageal activity without corresponding lymph node or soft tissue likely related to brown fat activity (image 64 of series 4) no LEFT hilar uptake Incidental CT findings: Calcified atheromatous plaque of the thoracic aorta similar to previous imaging. No pericardial effusion. Esophagus grossly normal. Background pulmonary emphysema ABDOMEN/PELVIS: No abnormal hypermetabolic activity within the liver, pancreas, adrenal glands, or spleen. No hypermetabolic lymph nodes in the abdomen or pelvis. Incidental CT findings: Liver unremarkable. Gallbladder grossly normal. Normal contour the spleen, normal splenic size. Adrenal glands with normal size. No hydronephrosis. Circumferential thickening of the urinary bladder nonspecific given collapse of the urinary bladder. No acute gastrointestinal process. The appendix is normal. Abdominal aortic atherosclerosis. SKELETON: No focal hypermetabolic activity to suggest  skeletal metastasis. Incidental CT findings: Cervical spinal fusion from C5 through C7. IMPRESSION: 1. Markedly hyperintense RIGHT lung mass with spiculated margins tethering the fissure in the posterior RIGHT upper lobe. 2. LEFT lower lobe mass displaying slightly less FDG uptake. 3. RIGHT hilar and mediastinal lymphadenopathy compatible with nodal involvement from primary lung cancer. 4. Smaller solid and part solid nodules also with variable degrees of FDG uptake but with less FDG uptake than dominant lesions remaining concerning for multifocal adenocarcinoma. 5. No definite disease beyond the chest and no signs of abdominal or pelvic involvement. 6. Extensive brown fat activation mildly limits assessment of supraclavicular regions and neck. 7. Pulmonary emphysema. 8. Aortic atherosclerosis. Aortic Atherosclerosis (ICD10-I70.0) and Emphysema (ICD10-J43.9). Electronically Signed   By: Zetta Bills  M.D.   On: 04/02/2020 15:58   DG CHEST PORT 1 VIEW  Result Date: 03/30/2020 CLINICAL DATA:  Bronchial brushings and biopsies EXAM: PORTABLE CHEST 1 VIEW COMPARISON:  CT chest 03/19/2020, chest radiograph 06/27/2011 FINDINGS: Masslike areas of consolidation within the right upper lobe, right lower lobe, and left lower lobe, compatible with masses that were better characterized on recent CT chest. There are surrounding hazy opacities, greatest in the right upper lobe. No discernible pneumothorax on this portable semi erect AP radiograph. No evidence of acute osseous abnormality. Partially imaged cervical ACDF. IMPRESSION: 1. Masslike areas of consolidation within the right upper lobe, right lower lobe, and left lower lobe, compatible with masses that were better characterized on recent CT chest. 2. Hazy surrounding opacities, greatest in the right upper lobe are nonspecific, but could relate to pneumonia, aspiration, or hemorrhage. Evaluation of chronicity is limited without recent radiographs for comparison. 3. No  discernible pneumothorax on this portable semi erect AP radiograph. Electronically Signed   By: Margaretha Sheffield MD   On: 03/30/2020 10:28   CT Super D Chest Wo Contrast  Result Date: 03/27/2020 CLINICAL DATA:  Pulmonary nodules.  Preprocedure mapping. EXAM: CT CHEST WITHOUT CONTRAST TECHNIQUE: Multidetector CT imaging of the chest was performed using thin slice collimation for electromagnetic bronchoscopy planning purposes, without intravenous contrast. COMPARISON:  03/19/2020 FINDINGS: Cardiovascular: The heart size is normal. No substantial pericardial effusion. Atherosclerotic calcification is noted in the wall of the thoracic aorta. Mediastinum/Nodes: 9 mm short axis paratracheal lymph node on 48/2 is similar. 12 mm short axis subcarinal lymph node measured previously is stable. Fullness noted right hilar region. The esophagus has normal imaging features. There is no axillary lymphadenopathy. Lungs/Pleura: Multiple irregular/spiculated pulmonary nodules again noted as characterized on lung cancer screening CT 8 days ago. Dominant lesion in the posterior right upper lobe tethers the major fissure and measures 4.6 x 2.3 cm. Other scattered smaller lesions are seen in the posterior right upper lobe retro hilar right lower lobe, right middle lobe, inferior right lower lobe and posterior left lower lobe. Upper Abdomen: Unremarkable. Musculoskeletal: No worrisome lytic or sclerotic osseous abnormality. Sclerotic focus in the T10 vertebral body is stable, likely benign. IMPRESSION: Multiple bilateral spiculated solid and sub solid pulmonary nodules and masses, as characterized on recent lung cancer screening CT. Imaging features concerning for multifocal adenocarcinoma. Borderline to mild mediastinal lymphadenopathy with fullness in the right hilar region. Metastatic disease a concern. Electronically Signed   By: Misty Stanley M.D.   On: 03/27/2020 10:33   DG C-ARM BRONCHOSCOPY  Result Date:  03/30/2020 C-ARM BRONCHOSCOPY: Fluoroscopy was utilized by the requesting physician.  No radiographic interpretation.    Labs:  CBC: Recent Labs    02/21/20 1115 03/30/20 0603 04/06/20 0908 04/17/20 1448  WBC 5.7 8.1 5.8 15.9*  HGB 13.9 13.3 12.6* 13.1  HCT 41.5 41.1 37.6* 38.4*  PLT 170 151 151 210    COAGS: Recent Labs    03/30/20 0603  INR 1.0  APTT 29    BMP: Recent Labs    02/21/20 1115 03/30/20 0603 04/06/20 0908 04/17/20 1448  NA 139 139 140 138  K 3.6 4.2 4.3 3.5  CL 107 104 105 101  CO2 25 25 30 30   GLUCOSE 154* 91 93 115*  BUN 7 11 8 13   CALCIUM 8.9 9.2 9.3 8.9  CREATININE 0.88 0.92 0.93 0.90  GFRNONAA  --  >60 >60 >60    LIVER FUNCTION TESTS: Recent Labs  02/21/20 1115 03/30/20 0603 04/06/20 0908 04/17/20 1448  BILITOT 0.4 0.7 0.6 0.3  AST 16 29 18 15   ALT 8* 23 11 17   ALKPHOS  --  43 52 57  PROT 6.3 6.9 6.7 6.9  ALBUMIN  --  3.7 3.7 3.8    TUMOR MARKERS: No results for input(s): AFPTM, CEA, CA199, CHROMGRNA in the last 8760 hours.  Assessment and Plan: 64 y.o. male with history of recently diagnosed stage IV non-small cell lung cancer favoring adenocarcinoma who presented with right upper lobe lung mass in addition to right lower lobe/ left lower lobe and right hilar /mediastinal lymphadenopathy and metastatic brain lesions.  He presents today for Port-A-Cath placement for chemotherapy/immunotherapy.  He recently underwent SRS for multiple brain mets on 11/5.Risks and benefits of image guided port-a-catheter placement was discussed with the patient including, but not limited to bleeding, infection, pneumothorax, or fibrin sheath development and need for additional procedures.  All of the patient's questions were answered, patient is agreeable to proceed. Consent signed and in chart.     Thank you for this interesting consult.  I greatly enjoyed meeting CHEVEZ SAMBRANO and look forward to participating in their care.  A copy of this  report was sent to the requesting provider on this date.  Electronically Signed: D. Rowe Robert, PA-C 04/23/2020, 1:22 PM   I spent a total of 25 minutes   in face to face in clinical consultation, greater than 50% of which was counseling/coordinating care for Port-A-Cath placement

## 2020-04-24 ENCOUNTER — Other Ambulatory Visit: Payer: Self-pay

## 2020-04-24 ENCOUNTER — Inpatient Hospital Stay: Payer: 59

## 2020-04-24 ENCOUNTER — Other Ambulatory Visit: Payer: 59

## 2020-04-24 ENCOUNTER — Encounter: Payer: Self-pay | Admitting: Internal Medicine

## 2020-04-24 VITALS — BP 119/68 | HR 74 | Temp 98.6°F | Resp 20 | Wt 153.8 lb

## 2020-04-24 DIAGNOSIS — Z5112 Encounter for antineoplastic immunotherapy: Secondary | ICD-10-CM | POA: Diagnosis not present

## 2020-04-24 DIAGNOSIS — C3491 Malignant neoplasm of unspecified part of right bronchus or lung: Secondary | ICD-10-CM

## 2020-04-24 LAB — CBC WITH DIFFERENTIAL (CANCER CENTER ONLY)
Abs Immature Granulocytes: 0.36 10*3/uL — ABNORMAL HIGH (ref 0.00–0.07)
Basophils Absolute: 0 10*3/uL (ref 0.0–0.1)
Basophils Relative: 0 %
Eosinophils Absolute: 0 10*3/uL (ref 0.0–0.5)
Eosinophils Relative: 0 %
HCT: 35.3 % — ABNORMAL LOW (ref 39.0–52.0)
Hemoglobin: 12.2 g/dL — ABNORMAL LOW (ref 13.0–17.0)
Immature Granulocytes: 2 %
Lymphocytes Relative: 10 %
Lymphs Abs: 1.5 10*3/uL (ref 0.7–4.0)
MCH: 27.9 pg (ref 26.0–34.0)
MCHC: 34.6 g/dL (ref 30.0–36.0)
MCV: 80.8 fL (ref 80.0–100.0)
Monocytes Absolute: 0.8 10*3/uL (ref 0.1–1.0)
Monocytes Relative: 5 %
Neutro Abs: 12.6 10*3/uL — ABNORMAL HIGH (ref 1.7–7.7)
Neutrophils Relative %: 83 %
Platelet Count: 160 10*3/uL (ref 150–400)
RBC: 4.37 MIL/uL (ref 4.22–5.81)
RDW: 15.9 % — ABNORMAL HIGH (ref 11.5–15.5)
WBC Count: 15.3 10*3/uL — ABNORMAL HIGH (ref 4.0–10.5)
nRBC: 0 % (ref 0.0–0.2)

## 2020-04-24 LAB — CMP (CANCER CENTER ONLY)
ALT: 26 U/L (ref 0–44)
AST: 22 U/L (ref 15–41)
Albumin: 3.5 g/dL (ref 3.5–5.0)
Alkaline Phosphatase: 53 U/L (ref 38–126)
Anion gap: 6 (ref 5–15)
BUN: 13 mg/dL (ref 8–23)
CO2: 30 mmol/L (ref 22–32)
Calcium: 8.8 mg/dL — ABNORMAL LOW (ref 8.9–10.3)
Chloride: 103 mmol/L (ref 98–111)
Creatinine: 0.9 mg/dL (ref 0.61–1.24)
GFR, Estimated: >60 mL/min (ref >60)
Glucose, Bld: 98 mg/dL (ref 70–99)
Potassium: 3.8 mmol/L (ref 3.5–5.1)
Sodium: 139 mmol/L (ref 135–145)
Total Bilirubin: 0.5 mg/dL (ref 0.3–1.2)
Total Protein: 6.2 g/dL — ABNORMAL LOW (ref 6.5–8.1)

## 2020-04-24 LAB — TSH: TSH: 1.348 u[IU]/mL (ref 0.320–4.118)

## 2020-04-24 MED ORDER — SODIUM CHLORIDE 0.9 % IV SOLN
150.0000 mg | Freq: Once | INTRAVENOUS | Status: AC
  Administered 2020-04-24: 150 mg via INTRAVENOUS
  Filled 2020-04-24: qty 150

## 2020-04-24 MED ORDER — SODIUM CHLORIDE 0.9 % IV SOLN
16.0000 mg | Freq: Once | INTRAVENOUS | Status: AC
  Administered 2020-04-24: 16 mg via INTRAVENOUS
  Filled 2020-04-24: qty 8

## 2020-04-24 MED ORDER — SODIUM CHLORIDE 0.9 % IV SOLN
200.0000 mg | Freq: Once | INTRAVENOUS | Status: AC
  Administered 2020-04-24: 200 mg via INTRAVENOUS
  Filled 2020-04-24: qty 8

## 2020-04-24 MED ORDER — HEPARIN SOD (PORK) LOCK FLUSH 100 UNIT/ML IV SOLN
500.0000 [IU] | Freq: Once | INTRAVENOUS | Status: AC | PRN
  Administered 2020-04-24: 500 [IU]
  Filled 2020-04-24: qty 5

## 2020-04-24 MED ORDER — SODIUM CHLORIDE 0.9 % IV SOLN
520.0000 mg | Freq: Once | INTRAVENOUS | Status: AC
  Administered 2020-04-24: 520 mg via INTRAVENOUS
  Filled 2020-04-24: qty 52

## 2020-04-24 MED ORDER — SODIUM CHLORIDE 0.9 % IV SOLN
10.0000 mg | Freq: Once | INTRAVENOUS | Status: AC
  Administered 2020-04-24: 10 mg via INTRAVENOUS
  Filled 2020-04-24: qty 10

## 2020-04-24 MED ORDER — SODIUM CHLORIDE 0.9 % IV SOLN
Freq: Once | INTRAVENOUS | Status: AC
  Filled 2020-04-24: qty 250

## 2020-04-24 MED ORDER — SODIUM CHLORIDE 0.9% FLUSH
10.0000 mL | INTRAVENOUS | Status: DC | PRN
  Administered 2020-04-24: 10 mL
  Filled 2020-04-24: qty 10

## 2020-04-24 MED ORDER — SODIUM CHLORIDE 0.9 % IV SOLN
500.0000 mg/m2 | Freq: Once | INTRAVENOUS | Status: AC
  Administered 2020-04-24: 900 mg via INTRAVENOUS
  Filled 2020-04-24: qty 20

## 2020-04-24 NOTE — Progress Notes (Signed)
Pt currently on Dexamethasone 4 mg daily x 1 week (as of 11/8) then 2 mg daily x 1 week. Dr. Julien Nordmann aware & ok'd to proceed w/ Keytruda.  Kennith Center, Pharm.D., CPP 04/24/2020@1 :47 PM

## 2020-04-24 NOTE — Progress Notes (Signed)
I called pt to introduce myself as his Arboriculturist and to discuss copay assistance.  Pt would like to apply so I completed the [employer] enrollment application for [employer] for 2021 and for 2022, got the pt's and Dr. Worthy Flank signature and faxed today for processing.  I also completed the [place] application for [place], got the pt's and Dr. Worthy Flank signature and faxedtoday for processing.  I will notify the pt of the outcome once received.  Pt is overqualified for the [employer].  I will give him my card for any questions or concerns he may have in the future.

## 2020-04-24 NOTE — Patient Instructions (Signed)
[facility] Discharge Instructions for Patients Receiving Chemotherapy  Today you received the following immunotherapy agent: Pembrolizumab and  chemotherapy agents: Pemetrexed and Carboplatin.  To help prevent nausea and vomiting after your treatment, we encourage you to take your nausea medication as directed by your MD.   If you develop nausea and vomiting that is not controlled by your nausea medication, call the clinic.   BELOW ARE SYMPTOMS THAT SHOULD BE REPORTED IMMEDIATELY:  *FEVER GREATER THAN 100.5 F  *CHILLS WITH OR WITHOUT FEVER  NAUSEA AND VOMITING THAT IS NOT CONTROLLED WITH YOUR NAUSEA MEDICATION  *UNUSUAL SHORTNESS OF BREATH  *UNUSUAL BRUISING OR BLEEDING  TENDERNESS IN MOUTH AND THROAT WITH OR WITHOUT PRESENCE OF ULCERS  *URINARY PROBLEMS  *BOWEL PROBLEMS  UNUSUAL RASH Items with * indicate a potential emergency and should be followed up as soon as possible. Pembrolizumab injection What is this medicine? PEMBROLIZUMAB (pem broe liz ue mab) is a monoclonal antibody. It is used to treat certain types of cancer. This medicine may be used for other purposes; ask your health care provider or pharmacist if you have questions. COMMON BRAND NAME(S): Keytruda What should I tell my health care provider before I take this medicine? They need to know if you have any of these conditions:  diabetes  immune system problems  inflammatory bowel disease  liver disease  lung or breathing disease  lupus  received or scheduled to receive an organ transplant or a stem-cell transplant that uses donor stem cells  an unusual or allergic reaction to pembrolizumab, other medicines, foods, dyes, or preservatives  pregnant or trying to get pregnant  breast-feeding How should I use this medicine? This medicine is for infusion into a vein. It is given by a health care professional in a hospital or clinic setting. A special MedGuide will be given to you before  each treatment. Be sure to read this information carefully each time. Talk to your pediatrician regarding the use of this medicine in children. While this drug may be prescribed for children as young as 6 months for selected conditions, precautions do apply. Overdosage: If you think you have taken too much of this medicine contact a poison control center or emergency room at once. NOTE: This medicine is only for you. Do not share this medicine with others. What if I miss a dose? It is important not to miss your dose. Call your doctor or health care professional if you are unable to keep an appointment. What may interact with this medicine? Interactions have not been studied. Give your health care provider a list of all the medicines, herbs, non-prescription drugs, or dietary supplements you use. Also tell them if you smoke, drink alcohol, or use illegal drugs. Some items may interact with your medicine. This list may not describe all possible interactions. Give your health care provider a list of all the medicines, herbs, non-prescription drugs, or dietary supplements you use. Also tell them if you smoke, drink alcohol, or use illegal drugs. Some items may interact with your medicine. What should I watch for while using this medicine? Your condition will be monitored carefully while you are receiving this medicine. You may need blood work done while you are taking this medicine. Do not become pregnant while taking this medicine or for 4 months after stopping it. Women should inform their doctor if they wish to become pregnant or think they might be pregnant. There is a potential for serious side effects to an unborn child. Talk  to your health care professional or pharmacist for more information. Do not breast-feed an infant while taking this medicine or for 4 months after the last dose. What side effects may I notice from receiving this medicine? Side effects that you should report to your doctor or  health care professional as soon as possible:  allergic reactions like skin rash, itching or hives, swelling of the face, lips, or tongue  bloody or black, tarry  breathing problems  changes in vision  chest pain  chills  confusion  constipation  cough  diarrhea  dizziness or feeling faint or lightheaded  fast or irregular heartbeat  fever  flushing  joint pain  low blood counts - this medicine may decrease the number of white blood cells, red blood cells and platelets. You may be at increased risk for infections and bleeding.  muscle pain  muscle weakness  pain, tingling, numbness in the hands or feet  persistent headache  redness, blistering, peeling or loosening of the skin, including inside the mouth  signs and symptoms of high blood sugar such as dizziness; dry mouth; dry skin; fruity breath; nausea; stomach pain; increased hunger or thirst; increased urination  signs and symptoms of kidney injury like trouble passing urine or change in the amount of urine  signs and symptoms of liver injury like dark urine, light-colored stools, loss of appetite, nausea, right upper belly pain, yellowing of the eyes or skin  sweating  swollen lymph nodes  weight loss Side effects that usually do not require medical attention (report to your doctor or health care professional if they continue or are bothersome):  decreased appetite  hair loss  muscle pain  tiredness This list may not describe all possible side effects. Call your doctor for medical advice about side effects. You may report side effects to FDA at 1-800-FDA-1088. Where should I keep my medicine? This drug is given in a hospital or clinic and will not be stored at home. NOTE: This sheet is a summary. It may not cover all possible information. If you have questions about this medicine, talk to your doctor, pharmacist, or health care provider.  2020 Elsevier/Gold Standard (2019-04-08  18:07:58)  Feel free to call the clinic should you have any questions or concerns. The clinic phone number is [phone].  Please show the [facility] at check-in to the Emergency Department and triage nurse.  Pemetrexed injection What is this medicine? PEMETREXED (PEM e TREX ed) is a chemotherapy drug used to treat lung cancers like non-small cell lung cancer and mesothelioma. It may also be used to treat other cancers. This medicine may be used for other purposes; ask your health care provider or pharmacist if you have questions. COMMON BRAND NAME(S): Alimta What should I tell my health care provider before I take this medicine? They need to know if you have any of these conditions:  infection (especially a virus infection such as chickenpox, cold sores, or herpes)  kidney disease  low blood counts, like low white cell, platelet, or red cell counts  lung or breathing disease, like asthma  radiation therapy  an unusual or allergic reaction to pemetrexed, other medicines, foods, dyes, or preservative  pregnant or trying to get pregnant  breast-feeding How should I use this medicine? This drug is given as an infusion into a vein. It is administered in a hospital or clinic by a specially trained health care professional. Talk to your pediatrician regarding the use of this medicine in children.  Special care may be needed. Overdosage: If you think you have taken too much of this medicine contact a poison control center or emergency room at once. NOTE: This medicine is only for you. Do not share this medicine with others. What if I miss a dose? It is important not to miss your dose. Call your doctor or health care professional if you are unable to keep an appointment. What may interact with this medicine? This medicine may interact with the following medications:  Ibuprofen This list may not describe all possible interactions. Give your health care provider a list of all  the medicines, herbs, non-prescription drugs, or dietary supplements you use. Also tell them if you smoke, drink alcohol, or use illegal drugs. Some items may interact with your medicine. What should I watch for while using this medicine? Visit your doctor for checks on your progress. This drug may make you feel generally unwell. This is not uncommon, as chemotherapy can affect healthy cells as well as cancer cells. Report any side effects. Continue your course of treatment even though you feel ill unless your doctor tells you to stop. In some cases, you may be given additional medicines to help with side effects. Follow all directions for their use. Call your doctor or health care professional for advice if you get a fever, chills or sore throat, or other symptoms of a cold or flu. Do not treat yourself. This drug decreases your body's ability to fight infections. Try to avoid being around people who are sick. This medicine may increase your risk to bruise or bleed. Call your doctor or health care professional if you notice any unusual bleeding. Be careful brushing and flossing your teeth or using a toothpick because you may get an infection or bleed more easily. If you have any dental work done, tell your dentist you are receiving this medicine. Avoid taking products that contain aspirin, acetaminophen, ibuprofen, naproxen, or ketoprofen unless instructed by your doctor. These medicines may hide a fever. Call your doctor or health care professional if you get diarrhea or mouth sores. Do not treat yourself. To protect your kidneys, drink water or other fluids as directed while you are taking this medicine. Do not become pregnant while taking this medicine or for 6 months after stopping it. Women should inform their doctor if they wish to become pregnant or think they might be pregnant. Men should not father a child while taking this medicine and for 3 months after stopping it. This may interfere with the  ability to father a child. You should talk to your doctor or health care professional if you are concerned about your fertility. There is a potential for serious side effects to an unborn child. Talk to your health care professional or pharmacist for more information. Do not breast-feed an infant while taking this medicine or for 1 week after stopping it. What side effects may I notice from receiving this medicine? Side effects that you should report to your doctor or health care professional as soon as possible:  allergic reactions like skin rash, itching or hives, swelling of the face, lips, or tongue  breathing problems  redness, blistering, peeling or loosening of the skin, including inside the mouth  signs and symptoms of bleeding such as bloody or black, tarry stools; red or dark-brown urine; spitting up blood or brown material that looks like coffee grounds; red spots on the skin; unusual bruising or bleeding from the eye, gums, or nose  signs and symptoms  of infection like fever or chills; cough; sore throat; pain or trouble passing urine  signs and symptoms of kidney injury like trouble passing urine or change in the amount of urine  signs and symptoms of liver injury like dark yellow or brown urine; general ill feeling or flu-like symptoms; light-colored stools; loss of appetite; nausea; right upper belly pain; unusually weak or tired; yellowing of the eyes or skin Side effects that usually do not require medical attention (report to your doctor or health care professional if they continue or are bothersome):  constipation  mouth sores  nausea, vomiting  unusually weak or tired This list may not describe all possible side effects. Call your doctor for medical advice about side effects. You may report side effects to FDA at 1-800-FDA-1088. Where should I keep my medicine? This drug is given in a hospital or clinic and will not be stored at home. NOTE: This sheet is a summary. It  may not cover all possible information. If you have questions about this medicine, talk to your doctor, pharmacist, or health care provider.  2020 Elsevier/Gold Standard (2017-07-22 16:11:33)  Carboplatin injection What is this medicine? CARBOPLATIN (KAR boe pla tin) is a chemotherapy drug. It targets fast dividing cells, like cancer cells, and causes these cells to die. This medicine is used to treat ovarian cancer and many other cancers. This medicine may be used for other purposes; ask your health care provider or pharmacist if you have questions. COMMON BRAND NAME(S): Paraplatin What should I tell my health care provider before I take this medicine? They need to know if you have any of these conditions:  blood disorders  hearing problems  kidney disease  recent or ongoing radiation therapy  an unusual or allergic reaction to carboplatin, cisplatin, other chemotherapy, other medicines, foods, dyes, or preservatives  pregnant or trying to get pregnant  breast-feeding How should I use this medicine? This drug is usually given as an infusion into a vein. It is administered in a hospital or clinic by a specially trained health care professional. Talk to your pediatrician regarding the use of this medicine in children. Special care may be needed. Overdosage: If you think you have taken too much of this medicine contact a poison control center or emergency room at once. NOTE: This medicine is only for you. Do not share this medicine with others. What if I miss a dose? It is important not to miss a dose. Call your doctor or health care professional if you are unable to keep an appointment. What may interact with this medicine?  medicines for seizures  medicines to increase blood counts like filgrastim, pegfilgrastim, sargramostim  some antibiotics like amikacin, gentamicin, neomycin, streptomycin, tobramycin  vaccines Talk to your doctor or health care professional before taking  any of these medicines:  acetaminophen  aspirin  ibuprofen  ketoprofen  naproxen This list may not describe all possible interactions. Give your health care provider a list of all the medicines, herbs, non-prescription drugs, or dietary supplements you use. Also tell them if you smoke, drink alcohol, or use illegal drugs. Some items may interact with your medicine. What should I watch for while using this medicine? Your condition will be monitored carefully while you are receiving this medicine. You will need important blood work done while you are taking this medicine. This drug may make you feel generally unwell. This is not uncommon, as chemotherapy can affect healthy cells as well as cancer cells. Report any side effects. Continue  your course of treatment even though you feel ill unless your doctor tells you to stop. In some cases, you may be given additional medicines to help with side effects. Follow all directions for their use. Call your doctor or health care professional for advice if you get a fever, chills or sore throat, or other symptoms of a cold or flu. Do not treat yourself. This drug decreases your body's ability to fight infections. Try to avoid being around people who are sick. This medicine may increase your risk to bruise or bleed. Call your doctor or health care professional if you notice any unusual bleeding. Be careful brushing and flossing your teeth or using a toothpick because you may get an infection or bleed more easily. If you have any dental work done, tell your dentist you are receiving this medicine. Avoid taking products that contain aspirin, acetaminophen, ibuprofen, naproxen, or ketoprofen unless instructed by your doctor. These medicines may hide a fever. Do not become pregnant while taking this medicine. Women should inform their doctor if they wish to become pregnant or think they might be pregnant. There is a potential for serious side effects to an unborn  child. Talk to your health care professional or pharmacist for more information. Do not breast-feed an infant while taking this medicine. What side effects may I notice from receiving this medicine? Side effects that you should report to your doctor or health care professional as soon as possible:  allergic reactions like skin rash, itching or hives, swelling of the face, lips, or tongue  signs of infection - fever or chills, cough, sore throat, pain or difficulty passing urine  signs of decreased platelets or bleeding - bruising, pinpoint red spots on the skin, black, tarry stools, nosebleeds  signs of decreased red blood cells - unusually weak or tired, fainting spells, lightheadedness  breathing problems  changes in hearing  changes in vision  chest pain  high blood pressure  low blood counts - This drug may decrease the number of white blood cells, red blood cells and platelets. You may be at increased risk for infections and bleeding.  nausea and vomiting  pain, swelling, redness or irritation at the injection site  pain, tingling, numbness in the hands or feet  problems with balance, talking, walking  trouble passing urine or change in the amount of urine Side effects that usually do not require medical attention (report to your doctor or health care professional if they continue or are bothersome):  hair loss  loss of appetite  metallic taste in the mouth or changes in taste This list may not describe all possible side effects. Call your doctor for medical advice about side effects. You may report side effects to FDA at 1-800-FDA-1088. Where should I keep my medicine? This drug is given in a hospital or clinic and will not be stored at home. NOTE: This sheet is a summary. It may not cover all possible information. If you have questions about this medicine, talk to your doctor, pharmacist, or health care provider.  2020 Elsevier/Gold Standard (2007-09-07  14:38:05)

## 2020-04-25 ENCOUNTER — Other Ambulatory Visit: Payer: 59

## 2020-04-25 ENCOUNTER — Telehealth: Payer: Self-pay | Admitting: *Deleted

## 2020-04-25 ENCOUNTER — Ambulatory Visit: Payer: 59 | Admitting: Physician Assistant

## 2020-04-25 ENCOUNTER — Encounter: Payer: Self-pay | Admitting: Internal Medicine

## 2020-04-25 NOTE — Progress Notes (Signed)
Pt was approved w/ the [employer] program for $25,000 for Alimta from 12/26/19 through 04/24/21. Pt's copay will be $25 each infusion.

## 2020-04-27 NOTE — Progress Notes (Signed)
[place] OFFICE PROGRESS NOTE  [name], MD [place] [id number]  DIAGNOSIS: Stage IV (T3, N2, M1c) non-small cell lung cancer favoring adenocarcinoma presented with right upper lobe lung mass in addition to right lower lobe, left lower lobe in addition to right hilar and mediastinal lymphadenopathy in addition to metastatic brain lesions diagnosed in October 2021.  Molecular studies by [employer]:  [id number], 9.5%,  PRIOR THERAPY: SRS to 3 brain lesion under the care of Dr. Lisbeth Renshaw.  Scheduled for April 20, 2020  CURRENT THERAPY: Systemic chemotherapy with carboplatin for AUC of 5, Alimta 500 mg/M2 and Keytruda 200 mg IV every 3 weeks.  First dose April 24, 2020. Status post 1 cycle.   INTERVAL HISTORY: Nicolas Allen 64 y.o. male returns to the clinic today for a follow-up visit accompanied by his wife.  The patient is feeling well today without any concerning complaints except for two brief episodes of muscle cramps/spasms that lasted a few seconds. He had one episode of a calf muscle cramp and on episode of a cramp in his left hand.  The patient underwent his first cycle of systemic chemotherapy last week and he tolerated well without any adverse side effects. Today he denies any fever, chills, night sweats, or weight loss.  He denies any shortness of breath, chest pain, hemoptysis, or cough.  He denies any headache or visual changes.  The patient completed SRS to the 3 subcentimeter brain metastases. He is currently tapering his decadron. He is currently taking a 1/2 tablet daily and next week is going to start taking 1/2 a tablet every other day. The patient denies any nausea, vomiting, diarrhea, or constipation.  The patient is here today for evaluation, repeat blood work, and a 1 week follow-up visit to manage any adverse side effects of treatment.  MEDICAL HISTORY: Past Medical History:  Diagnosis Date  . Anemia 06/24/2012  . ANXIETY  02/03/2007  . Cervical disc disease 02/12/2012   S/p surgury jan 2013  . Erectile dysfunction 02/11/2011  . GERD (gastroesophageal reflux disease)   . GLUCOSE INTOLERANCE 01/04/2008  . HYPERLIPIDEMIA 02/03/2007  . HYPERTENSION 02/03/2007  . IBS (irritable bowel syndrome)   . Impaired glucose tolerance 02/11/2011  . Smoker 08/22/2014  . Substance abuse ([place]) 2001   sober for 16 yrs    ALLERGIES:  has No Known Allergies.  MEDICATIONS:  Current Outpatient Medications  Medication Sig Dispense Refill  . amLODipine-benazepril (LOTREL) 10-20 MG capsule TAKE 1 CAPSULE BY MOUTH ONCE DAILY . APPOINTMENT REQUIRED FOR FUTURE REFILLS 30 capsule 0  . aspirin EC 81 MG tablet Take 81 mg by mouth daily. Swallow whole.    . b complex vitamins tablet Take 1 tablet by mouth daily.    Marland Kitchen dexamethasone (DECADRON) 4 MG tablet Take 1 tablet (4 mg total) by mouth 3 (three) times daily. 90 tablet 0  . folic acid (FOLVITE) 1 MG tablet Take 1 tablet (1 mg total) by mouth daily. 30 tablet 4  . Garlic 7619 MG CAPS Take 1,000 mg by mouth daily.     Marland Kitchen lidocaine-prilocaine (EMLA) cream Apply to the Port-A-Cath site 30-60 minutes before chemotherapy. 30 g 0  . Omega-3 Fatty Acids (FISH OIL PO) Take 1 capsule by mouth daily.     . pravastatin (PRAVACHOL) 40 MG tablet TAKE 1 TABLET BY MOUTH ONCE DAILY . APPOINTMENT REQUIRED FOR FUTURE REFILLS 30 tablet 0  . prochlorperazine (COMPAZINE) 10 MG tablet Take 1  tablet (10 mg total) by mouth every 6 (six) hours as needed for nausea or vomiting. 30 tablet 0  . vardenafil (LEVITRA) 20 MG tablet TAKE 1 TABLET BY MOUTH AS NEEDED FOR  ERECTILE  DYSFUNCTION 10 tablet 5  . Vitamin D, Ergocalciferol, (DRISDOL) 1.25 MG (50000 UNIT) CAPS capsule Take 1 capsule (50,000 Units total) by mouth every 7 (seven) days. (Patient taking differently: Take 50,000 Units by mouth every Sunday. ) 12 capsule 0   No current facility-administered medications for this visit.    SURGICAL HISTORY:  Past Surgical  History:  Procedure Laterality Date  . BRONCHIAL BIOPSY  03/30/2020   Procedure: BRONCHIAL BIOPSIES;  Surgeon: Garner Nash, DO;  Location: [facility];  Service: Pulmonary;;  . BRONCHIAL BRUSHINGS  03/30/2020   Procedure: BRONCHIAL BRUSHINGS;  Surgeon: Garner Nash, DO;  Location: [facility];  Service: Pulmonary;;  . BRONCHIAL NEEDLE ASPIRATION BIOPSY  03/30/2020   Procedure: BRONCHIAL NEEDLE ASPIRATION BIOPSIES;  Surgeon: Garner Nash, DO;  Location: [facility];  Service: Pulmonary;;  . BRONCHIAL WASHINGS  03/30/2020   Procedure: BRONCHIAL WASHINGS;  Surgeon: Garner Nash, DO;  Location: [facility];  Service: Pulmonary;;  . COLONOSCOPY  2007  . IR IMAGING GUIDED PORT INSERTION  04/23/2020  . neck fusion  2012   C4  . NO PAST SURGERIES    . VIDEO BRONCHOSCOPY WITH ENDOBRONCHIAL NAVIGATION N/A 03/30/2020   Procedure: VIDEO BRONCHOSCOPY WITH ENDOBRONCHIAL NAVIGATION;  Surgeon: Garner Nash, DO;  Location: [facility];  Service: Pulmonary;  Laterality: N/A;  . VIDEO BRONCHOSCOPY WITH ENDOBRONCHIAL ULTRASOUND N/A 03/30/2020   Procedure: VIDEO BRONCHOSCOPY WITH ENDOBRONCHIAL ULTRASOUND;  Surgeon: Garner Nash, DO;  Location: [facility];  Service: Pulmonary;  Laterality: N/A;    REVIEW OF SYSTEMS:   Review of Systems  Constitutional: Negative for appetite change, chills, fatigue, fever and unexpected weight change.  HENT: Negative for mouth sores, nosebleeds, sore throat and trouble swallowing.   Eyes: Negative for eye problems and icterus.  Respiratory: Negative for cough, hemoptysis, shortness of breath and wheezing.   Cardiovascular: Negative for chest pain and leg swelling.  Gastrointestinal: Negative for abdominal pain, constipation, diarrhea, nausea and vomiting.  Genitourinary: Negative for bladder incontinence, difficulty urinating, dysuria, frequency and hematuria.   Musculoskeletal: Positive for muscle cramps. Negative for back pain, gait problem,  neck pain and neck stiffness.  Skin: Negative for itching and rash.  Neurological: Negative for dizziness, extremity weakness, gait problem, headaches, light-headedness and seizures.  Hematological: Negative for adenopathy. Does not bruise/bleed easily.  Psychiatric/Behavioral: Negative for confusion, depression and sleep disturbance. The patient is not nervous/anxious.     PHYSICAL EXAMINATION:  Blood pressure 133/70, pulse 82, temperature 97.7 F (36.5 C), temperature source Tympanic, resp. rate 16, height 6' (1.829 m), weight 151 lb 8 oz (68.7 kg), SpO2 99 %.  ECOG PERFORMANCE STATUS: 1 - Symptomatic but completely ambulatory  Physical Exam  Constitutional: Oriented to person, place, and time and well-developed, well-nourished, and in no distress.  HENT:  Head: Normocephalic and atraumatic.  Mouth/Throat: Oropharynx is clear and moist. No oropharyngeal exudate.  Eyes: Conjunctivae are normal. Right eye exhibits no discharge. Left eye exhibits no discharge. No scleral icterus.  Neck: Normal range of motion. Neck supple.  Cardiovascular: Normal rate, regular rhythm, normal heart sounds and intact distal pulses.   Pulmonary/Chest: Effort normal and breath sounds normal. No respiratory distress. No wheezes. No rales.  Abdominal: Soft. Bowel sounds are normal. Exhibits no distension and no mass. There  is no tenderness.  Musculoskeletal: Normal range of motion. Exhibits no edema.  Lymphadenopathy:    No cervical adenopathy.  Neurological: Alert and oriented to person, place, and time. Exhibits normal muscle tone. Gait normal. Coordination normal.  Skin: Skin is warm and dry. No rash noted. Not diaphoretic. No erythema. No pallor.  Psychiatric: Mood, memory and judgment normal.  Vitals reviewed.  LABORATORY DATA: Lab Results  Component Value Date   WBC 8.1 05/01/2020   HGB 12.6 (L) 05/01/2020   HCT 36.9 (L) 05/01/2020   MCV 81.8 05/01/2020   PLT 102 (L) 05/01/2020      Chemistry       Component Value Date/Time   NA 136 05/01/2020 0955   K 4.4 05/01/2020 0955   CL 101 05/01/2020 0955   CO2 27 05/01/2020 0955   BUN 19 05/01/2020 0955   CREATININE 0.91 05/01/2020 0955   CREATININE 0.88 02/21/2020 1115      Component Value Date/Time   CALCIUM 9.1 05/01/2020 0955   ALKPHOS 50 05/01/2020 0955   AST 56 (H) 05/01/2020 0955   ALT 133 (H) 05/01/2020 0955   BILITOT 0.5 05/01/2020 0955       RADIOGRAPHIC STUDIES:  MR Brain W Wo Contrast  Result Date: 04/12/2020 CLINICAL DATA:  Metastatic lung cancer. Stereotactic radio surgery treatment planning. EXAM: MRI HEAD WITHOUT AND WITH CONTRAST TECHNIQUE: Multiplanar, multiecho pulse sequences of the brain and surrounding structures were obtained without and with intravenous contrast. CONTRAST:  73mL MULTIHANCE GADOBENATE DIMEGLUMINE 529 MG/ML IV SOLN COMPARISON:  MRI head 04/07/2020 FINDINGS: Brain: 3 metastatic deposits in the brain are noted. 3 mm enhancing lesion right lateral sella cerebellum axial image 34 9 mm enhancing lesion in the superior cerebellar vermis axial image 36. Mild surrounding edema 4 mm enhancing lesion left parietal cortex axial image 105 with minimal edema. Negative for acute infarct. Negative for hemorrhage. Mild white matter changes consistent with chronic microvascular ischemia. Vascular: Normal arterial flow voids Skull and upper cervical spine: No focal skeletal lesion. Paranasal sinuses clear.  Mastoid clear.  Normal orbit 24 mm enhancing mass in the right parotid with central nonenhancing tissue. Sinuses/Orbits: Paranasal sinuses clear.  Negative orbit Other: 24 mm enhancing mass right parotid with central nonenhancing tissue. IMPRESSION: Three metastatic deposits in the brain as above. No significant mass-effect or midline shift. No intracranial hemorrhage. 24 mm enhancing mass right parotid. This has irregular enhancement and is compatible with neoplasm which could be malignant. Recommend CT neck with  contrast for further evaluation. It is notable that this mass does not show hypermetabolic uptake on PET. Electronically Signed   By: Franchot Gallo M.D.   On: 04/12/2020 15:32   MR BRAIN W WO CONTRAST  Result Date: 04/07/2020 CLINICAL DATA:  Non-small cell lung cancer staging. EXAM: MRI HEAD WITHOUT AND WITH CONTRAST TECHNIQUE: Multiplanar, multiecho pulse sequences of the brain and surrounding structures were obtained without and with intravenous contrast. CONTRAST:  78mL GADAVIST GADOBUTROL 1 MMOL/ML IV SOLN COMPARISON:  None. FINDINGS: Brain: No acute infarct. No hydrocephalus. No acute hemorrhage. Scattered T2/FLAIR hyperintensities within the white matter, compatible with chronic microvascular ischemic disease. There is an approximately 10 x 7 mm enhancing lesion involving the cerebellar vermis (see series 16, image 38 and series 18, image 14).There is mild surrounding edema without substantial mass effect. The fourth ventricle is widely patent. Approximately 3 mm focus of enhancement in the lateral right cerebellar hemisphere (series 16, image 39 and series 18, image 7) Approximately 5 mm focus  of enhancement in the left parieto-occipital region (series 16, image 108 and series 18, image 20) with mild surrounding edema. Additional punctate apparent enhancement in the right cerebellar hemisphere is only seen on the coronal sequence (series 17, image 10). Vascular: Major proximal arterial flow voids are maintained at the skull base. Skull and upper cervical spine: Normal marrow signal. Sinuses/Orbits: Right maxillary sinus retention cyst. Unremarkable orbits. Other: No mastoid effusions. IMPRESSION: 1. Approximately 10 mm enhancing lesion involving the cerebellar vermis, 3 mm enhancing lesion in the right cerebellar hemisphere, and 5 mm enhancing lesion in the left parieto-occiptail region, concerning for metastases given the clinical history. There is mild surrounding edema without substantial mass effect.  The fourth ventricle is widely patent. 2. Punctate apparent enhancement in the right cerebellar hemisphere is only seen on the coronal sequence (series 17, image 10). While this may be artifactual, it warrants attention on follow-up. These results will be called to the ordering clinician or representative by the Radiologist Assistant, and communication documented in the PACS or [employer]. Electronically Signed   By: Margaretha Sheffield MD   On: 04/07/2020 14:32   NM PET Image Initial (PI) Skull Base To Thigh  Result Date: 04/02/2020 CLINICAL DATA:  Is initial treatment strategy for pulmonary lesions suspicious for multifocal adenocarcinoma. EXAM: NUCLEAR MEDICINE PET SKULL BASE TO THIGH TECHNIQUE: 7.3 mCi F-18 FDG was injected intravenously. Full-ring PET imaging was performed from the skull base to thigh after the radiotracer. CT data was obtained and used for attenuation correction and anatomic localization. Fasting blood glucose: 91 mg/dl COMPARISON:  CT of the chest from March 27, 2020 FINDINGS: Mediastinal blood pool activity: SUV max 1.67 Liver activity: SUV max NA NECK: Diffuse brown fat activity throughout the neck and chest. This is present in the supraclavicular region bilaterally and in the supraclavicular fossa extending posteriorly as well as associated with paraspinous uptake. No focal nodal enlargement or hypermetabolic lymph nodes in the neck. Incidental CT findings: Cervical spinal fusion with some streak artifact. CHEST: Pulmonary lesions with increased FDG uptake. RIGHT upper lobe mass (image 31, series 8 and image 74 of series 4) not changed from the CT of 3 days ago. (SUVmax = 9.7 LEFT lower lobe cystic and solid area with adjacent septal thickening, compatible with LEFT lower lobe mass (SUVmax = 5.1) Part solid nodule in the RIGHT lower lobe (image 89 of series 4) also unchanged (SUVmax = 2.5) Part solid, mainly solid nodule just posterior to the major fissure and hilar structures  in the RIGHT chest in the RIGHT lower lobe (image 33, series 8) 1.5 cm with mildly increased FDG uptake (SUVmax = 2.8) Low level metabolic activity present and smaller pulmonary nodules. Spiculated nodule in the RIGHT lung apex (image 14, series 8) SUV value less than mediastinal blood pool but suspicious based on morphologic features. Other areas of ground-glass and nodularity unchanged from recent CT imaging of the chest none with metabolic uptake exceeding the dominant areas in the RIGHT upper and LEFT lower lobes. RIGHT hilar adenopathy on image 75 of series 4 (SUVmax = 9.2) this may represent adjacent juxta hilar lymph nodes. Subcarinal nodal tissue 13 mm short axis with intense metabolic activity (SUVmax = 10.2) Precarinal nodal tissue with increased metabolic activity of similar intensity. High RIGHT paratracheal lymph node (image 63 of series 4) 1 cm (SUVmax = 7.7) LEFT para esophageal activity without corresponding lymph node or soft tissue likely related to brown fat activity (image 64 of series 4) no LEFT  hilar uptake Incidental CT findings: Calcified atheromatous plaque of the thoracic aorta similar to previous imaging. No pericardial effusion. Esophagus grossly normal. Background pulmonary emphysema ABDOMEN/PELVIS: No abnormal hypermetabolic activity within the liver, pancreas, adrenal glands, or spleen. No hypermetabolic lymph nodes in the abdomen or pelvis. Incidental CT findings: Liver unremarkable. Gallbladder grossly normal. Normal contour the spleen, normal splenic size. Adrenal glands with normal size. No hydronephrosis. Circumferential thickening of the urinary bladder nonspecific given collapse of the urinary bladder. No acute gastrointestinal process. The appendix is normal. Abdominal aortic atherosclerosis. SKELETON: No focal hypermetabolic activity to suggest skeletal metastasis. Incidental CT findings: Cervical spinal fusion from C5 through C7. IMPRESSION: 1. Markedly hyperintense RIGHT  lung mass with spiculated margins tethering the fissure in the posterior RIGHT upper lobe. 2. LEFT lower lobe mass displaying slightly less FDG uptake. 3. RIGHT hilar and mediastinal lymphadenopathy compatible with nodal involvement from primary lung cancer. 4. Smaller solid and part solid nodules also with variable degrees of FDG uptake but with less FDG uptake than dominant lesions remaining concerning for multifocal adenocarcinoma. 5. No definite disease beyond the chest and no signs of abdominal or pelvic involvement. 6. Extensive brown fat activation mildly limits assessment of supraclavicular regions and neck. 7. Pulmonary emphysema. 8. Aortic atherosclerosis. Aortic Atherosclerosis (ICD10-I70.0) and Emphysema (ICD10-J43.9). Electronically Signed   By: Zetta Bills M.D.   On: 04/02/2020 15:58   IR IMAGING GUIDED PORT INSERTION  Result Date: 04/23/2020 INDICATION: 64 year old male with stage IV non-small cell lung cancer. He presents for placement of a port catheter. EXAM: IMPLANTED PORT A CATH PLACEMENT WITH ULTRASOUND AND FLUOROSCOPIC GUIDANCE MEDICATIONS: 2 g Ancef; The antibiotic was administered within an appropriate time interval prior to skin puncture. ANESTHESIA/SEDATION: Versed 3 mg IV; Fentanyl 100 mcg IV; Moderate Sedation Time:  15 minutes The patient was continuously monitored during the procedure by the interventional radiology nurse under my direct supervision. FLUOROSCOPY TIME:  0 minutes, 6 seconds (1 mGy) COMPLICATIONS: None immediate. PROCEDURE: The right neck and chest was prepped with chlorhexidine, and draped in the usual sterile fashion using maximum barrier technique (cap and mask, sterile gown, sterile gloves, large sterile sheet, hand hygiene and cutaneous antiseptic). Local anesthesia was attained by infiltration with 1% lidocaine with epinephrine. Ultrasound demonstrated patency of the right internal jugular vein, and this was documented with an image. Under real-time  ultrasound guidance, this vein was accessed with a 21 gauge micropuncture needle and image documentation was performed. A small dermatotomy was made at the access site with an 11 scalpel. A 0.018" wire was advanced into the SVC and the access needle exchanged for a 37F micropuncture vascular sheath. The 0.018" wire was then removed and a 0.035" wire advanced into the IVC. An appropriate location for the subcutaneous reservoir was selected below the clavicle and an incision was made through the skin and underlying soft tissues. The subcutaneous tissues were then dissected using a combination of blunt and sharp surgical technique and a pocket was formed. A single lumen power injectable portacatheter was then tunneled through the subcutaneous tissues from the pocket to the dermatotomy and the port reservoir placed within the subcutaneous pocket. The venous access site was then serially dilated and a peel away vascular sheath placed over the wire. The wire was removed and the port catheter advanced into position under fluoroscopic guidance. The catheter tip is positioned in the superior cavoatrial junction. This was documented with a spot image. The portacatheter was then tested and found to flush and aspirate well.  The port was flushed with saline followed by 100 units/mL heparinized saline. The pocket was then closed in two layers using first subdermal inverted interrupted absorbable sutures followed by a running subcuticular suture. The epidermis was then sealed with Dermabond. The dermatotomy at the venous access site was also closed with Dermabond. IMPRESSION: Successful placement of a right IJ approach Power Port with ultrasound and fluoroscopic guidance. The catheter is ready for use. Electronically Signed   By: Jacqulynn Cadet M.D.   On: 04/23/2020 16:03     ASSESSMENT/PLAN:  This is a very pleasant 64 year old African-American male diagnosed with stage IV (T3, N2, M1C (non-small cell lung cancer),  adenocarcinoma.  The patient presented with a right upper lobe lung mass in addition to a right lower lobe lung mass and left lower lobe lung mass in addition to right hilar and mediastinal lymphadenopathy.  He also had metastatic disease to the brain.  He was diagnosed in October 2021.  The patient's molecular studies by [employer] showed no actionable mutations.  The patient completed SRS treatment to the 3 brain metastases under the care of Dr. Lisbeth Renshaw on April 20, 2020. He is currently tapering his decadron.  The patient is currently undergoing palliative systemic chemotherapy with carboplatin for an AUC of 5, Alimta 500 mg per metered squared, Keytruda 200 mg IV every 3 weeks.  The patient status post 1 cycle and tolerated well without any adverse side effects.   Labs were reviewed. His LFTs are slightly elevated. We will continue to monitor this closely on weekly labs. Recommend that he continue on the same treatment at the same dose.  We will see the patient back for follow-up visit in 2 weeks for evaluation before starting cycle #2.  For his muscle cramps, no electrolyte derangements noted. He was advised to increase his oral hydration.   The patient was advised to call immediately if he has any concerning symptoms in the interval. The patient voices understanding of current disease status and treatment options and is in agreement with the current care plan. All questions were answered. The patient knows to call the clinic with any problems, questions or concerns. We can certainly see the patient much sooner if necessary    No orders of the defined types were placed in this encounter.    Duran Ohern L Shanielle Correll, PA-C 05/01/20

## 2020-05-01 ENCOUNTER — Other Ambulatory Visit: Payer: Self-pay

## 2020-05-01 ENCOUNTER — Encounter: Payer: Self-pay | Admitting: Physician Assistant

## 2020-05-01 ENCOUNTER — Inpatient Hospital Stay: Payer: 59

## 2020-05-01 ENCOUNTER — Inpatient Hospital Stay: Payer: 59 | Admitting: Physician Assistant

## 2020-05-01 VITALS — BP 133/70 | HR 82 | Temp 97.7°F | Resp 16 | Ht 72.0 in | Wt 151.5 lb

## 2020-05-01 DIAGNOSIS — C3491 Malignant neoplasm of unspecified part of right bronchus or lung: Secondary | ICD-10-CM

## 2020-05-01 DIAGNOSIS — Z5112 Encounter for antineoplastic immunotherapy: Secondary | ICD-10-CM | POA: Diagnosis not present

## 2020-05-01 LAB — CMP (CANCER CENTER ONLY)
ALT: 133 U/L — ABNORMAL HIGH (ref 0–44)
AST: 56 U/L — ABNORMAL HIGH (ref 15–41)
Albumin: 3.5 g/dL (ref 3.5–5.0)
Alkaline Phosphatase: 50 U/L (ref 38–126)
Anion gap: 8 (ref 5–15)
BUN: 19 mg/dL (ref 8–23)
CO2: 27 mmol/L (ref 22–32)
Calcium: 9.1 mg/dL (ref 8.9–10.3)
Chloride: 101 mmol/L (ref 98–111)
Creatinine: 0.91 mg/dL (ref 0.61–1.24)
GFR, Estimated: >60 mL/min (ref >60)
Glucose, Bld: 80 mg/dL (ref 70–99)
Potassium: 4.4 mmol/L (ref 3.5–5.1)
Sodium: 136 mmol/L (ref 135–145)
Total Bilirubin: 0.5 mg/dL (ref 0.3–1.2)
Total Protein: 6.7 g/dL (ref 6.5–8.1)

## 2020-05-01 LAB — CBC WITH DIFFERENTIAL (CANCER CENTER ONLY)
Abs Immature Granulocytes: 0.08 10*3/uL — ABNORMAL HIGH (ref 0.00–0.07)
Basophils Absolute: 0 10*3/uL (ref 0.0–0.1)
Basophils Relative: 0 %
Eosinophils Absolute: 0.2 10*3/uL (ref 0.0–0.5)
Eosinophils Relative: 2 %
HCT: 36.9 % — ABNORMAL LOW (ref 39.0–52.0)
Hemoglobin: 12.6 g/dL — ABNORMAL LOW (ref 13.0–17.0)
Immature Granulocytes: 1 %
Lymphocytes Relative: 18 %
Lymphs Abs: 1.5 10*3/uL (ref 0.7–4.0)
MCH: 27.9 pg (ref 26.0–34.0)
MCHC: 34.1 g/dL (ref 30.0–36.0)
MCV: 81.8 fL (ref 80.0–100.0)
Monocytes Absolute: 0.4 10*3/uL (ref 0.1–1.0)
Monocytes Relative: 5 %
Neutro Abs: 6 10*3/uL (ref 1.7–7.7)
Neutrophils Relative %: 74 %
Platelet Count: 102 10*3/uL — ABNORMAL LOW (ref 150–400)
RBC: 4.51 MIL/uL (ref 4.22–5.81)
RDW: 15.3 % (ref 11.5–15.5)
WBC Count: 8.1 10*3/uL (ref 4.0–10.5)
nRBC: 0.2 % (ref 0.0–0.2)

## 2020-05-02 ENCOUNTER — Ambulatory Visit: Payer: 59 | Admitting: Pulmonary Disease

## 2020-05-02 ENCOUNTER — Encounter: Payer: Self-pay | Admitting: Pulmonary Disease

## 2020-05-02 VITALS — BP 124/52 | HR 102 | Temp 97.9°F | Wt 151.0 lb

## 2020-05-02 DIAGNOSIS — C3491 Malignant neoplasm of unspecified part of right bronchus or lung: Secondary | ICD-10-CM

## 2020-05-02 DIAGNOSIS — Z87891 Personal history of nicotine dependence: Secondary | ICD-10-CM | POA: Diagnosis not present

## 2020-05-02 DIAGNOSIS — C7931 Secondary malignant neoplasm of brain: Secondary | ICD-10-CM | POA: Diagnosis not present

## 2020-05-02 NOTE — Progress Notes (Signed)
Synopsis: Referred in October 2021 for lung mass by [name], MD  Subjective:   PATIENT ID: Nicolas Allen GENDER: male DOB: [birth year], MRN: [id number]  Chief Complaint  Patient presents with  . Follow-up    5 week follow up.  pt stated that he is doing well    This is a 64 year old gentleman, former smoker recently enrolled into the lung cancer screening program, medical history include hypertension hyperlipidemia and gastroesophageal reflux.  Patient had a lung cancer screening CT that was completed on 03/19/2020 which revealed bilateral lung masses concerning for malignancy.  He has a posterior right upper lobe mass that involves the lateral chest wall that is 2.4 x 4.9 x 2.5 cm, a 2.5 cm subsolid nodule within the right lower lobe as well as a 4.5 cm cystic solid lesion within the left lower lobe.  Patient was referred today for further evaluation.  As well as discussed utility of bronchoscopy and tissue diagnosis. fmhx cousin with lung cancer.  Denies fevers chills night sweats weight loss or hemoptysis.  OV 05/02/2020: 64 year old gentleman here today after bronchoscopy.  Recently diagnosed with stage IV non-small cell lung cancer.  Has started chemotherapy and has received brain radiation.  Had a MRI of the brain which showed metastasis M1 C disease.  At this point he has started chemotherapy as well as concurrent immunotherapy with Keytruda.  He has tolerated this well had recent office follow-up with medical oncology.  Office notes today reviewed from cancer center.  From a respiratory standpoint patient is doing well.     Past Medical History:  Diagnosis Date  . Anemia 06/24/2012  . ANXIETY 02/03/2007  . Cervical disc disease 02/12/2012   S/p surgury jan 2013  . Erectile dysfunction 02/11/2011  . GERD (gastroesophageal reflux disease)   . GLUCOSE INTOLERANCE 01/04/2008  . HYPERLIPIDEMIA 02/03/2007  . HYPERTENSION 02/03/2007  . IBS (irritable bowel syndrome)   . Impaired glucose  tolerance 02/11/2011  . Smoker 08/22/2014  . Substance abuse ([place]) 2001   sober for 16 yrs     Family History  Problem Relation Age of Onset  . Cancer Mother        esophagus  . Cancer Cousin   . Stroke Other   . Hypertension Other   . Colon cancer Neg Hx      Past Surgical History:  Procedure Laterality Date  . BRONCHIAL BIOPSY  03/30/2020   Procedure: BRONCHIAL BIOPSIES;  Surgeon: Garner Nash, DO;  Location: [facility];  Service: Pulmonary;;  . BRONCHIAL BRUSHINGS  03/30/2020   Procedure: BRONCHIAL BRUSHINGS;  Surgeon: Garner Nash, DO;  Location: [facility];  Service: Pulmonary;;  . BRONCHIAL NEEDLE ASPIRATION BIOPSY  03/30/2020   Procedure: BRONCHIAL NEEDLE ASPIRATION BIOPSIES;  Surgeon: Garner Nash, DO;  Location: [facility];  Service: Pulmonary;;  . BRONCHIAL WASHINGS  03/30/2020   Procedure: BRONCHIAL WASHINGS;  Surgeon: Garner Nash, DO;  Location: [facility];  Service: Pulmonary;;  . COLONOSCOPY  2007  . IR IMAGING GUIDED PORT INSERTION  04/23/2020  . neck fusion  2012   C4  . NO PAST SURGERIES    . VIDEO BRONCHOSCOPY WITH ENDOBRONCHIAL NAVIGATION N/A 03/30/2020   Procedure: VIDEO BRONCHOSCOPY WITH ENDOBRONCHIAL NAVIGATION;  Surgeon: Garner Nash, DO;  Location: [facility];  Service: Pulmonary;  Laterality: N/A;  . VIDEO BRONCHOSCOPY WITH ENDOBRONCHIAL ULTRASOUND N/A 03/30/2020   Procedure: VIDEO BRONCHOSCOPY WITH ENDOBRONCHIAL ULTRASOUND;  Surgeon: Garner Nash, DO;  Location: [facility];  Service: Pulmonary;  Laterality: N/A;    Social History   Socioeconomic History  . Marital status: Married    Spouse name: Not on file  . Number of children: Not on file  . Years of education: Not on file  . Highest education level: Not on file  Occupational History  . Occupation: janitor  Tobacco Use  . Smoking status: Former Smoker    Packs/day: 1.50    Years: 30.00    Pack years: 45.00    Types: Cigarettes    Quit date: 02/22/2020     Years since quitting: 0.1  . Smokeless tobacco: Former Systems developer    Types: Secondary school teacher  . Vaping Use: Never used  Substance and Sexual Activity  . Alcohol use: No    Alcohol/week: 0.0 standard drinks    Comment: quit 2000 prior heavy  . Drug use: No  . Sexual activity: Not on file  Other Topics Concern  . Not on file  Social History Narrative  . Not on file   Social Determinants of Health   Financial Resource Strain:   . Difficulty of Paying Living Expenses: Not on file  Food Insecurity:   . Worried About Charity fundraiser in the Last Year: Not on file  . Ran Out of Food in the Last Year: Not on file  Transportation Needs:   . Lack of Transportation (Medical): Not on file  . Lack of Transportation (Non-Medical): Not on file  Physical Activity:   . Days of Exercise per Week: Not on file  . Minutes of Exercise per Session: Not on file  Stress:   . Feeling of Stress : Not on file  Social Connections:   . Frequency of Communication with Friends and Family: Not on file  . Frequency of Social Gatherings with Friends and Family: Not on file  . Attends Religious Services: Not on file  . Active Member of Clubs or Organizations: Not on file  . Attends Archivist Meetings: Not on file  . Marital Status: Not on file  Intimate Partner Violence:   . Fear of Current or Ex-Partner: Not on file  . Emotionally Abused: Not on file  . Physically Abused: Not on file  . Sexually Abused: Not on file     No Known Allergies   Outpatient Medications Prior to Visit  Medication Sig Dispense Refill  . amLODipine-benazepril (LOTREL) 10-20 MG capsule TAKE 1 CAPSULE BY MOUTH ONCE DAILY . APPOINTMENT REQUIRED FOR FUTURE REFILLS 30 capsule 0  . aspirin EC 81 MG tablet Take 81 mg by mouth daily. Swallow whole.    . b complex vitamins tablet Take 1 tablet by mouth daily.    Marland Kitchen dexamethasone (DECADRON) 4 MG tablet Take 1 tablet (4 mg total) by mouth 3 (three) times daily. 90 tablet 0  .  folic acid (FOLVITE) 1 MG tablet Take 1 tablet (1 mg total) by mouth daily. 30 tablet 4  . Garlic 6789 MG CAPS Take 1,000 mg by mouth daily.     Marland Kitchen lidocaine-prilocaine (EMLA) cream Apply to the Port-A-Cath site 30-60 minutes before chemotherapy. 30 g 0  . Omega-3 Fatty Acids (FISH OIL PO) Take 1 capsule by mouth daily.     . pravastatin (PRAVACHOL) 40 MG tablet TAKE 1 TABLET BY MOUTH ONCE DAILY . APPOINTMENT REQUIRED FOR FUTURE REFILLS 30 tablet 0  . prochlorperazine (COMPAZINE) 10 MG tablet Take 1 tablet (10 mg total) by mouth every 6 (six) hours as needed for  nausea or vomiting. 30 tablet 0  . vardenafil (LEVITRA) 20 MG tablet TAKE 1 TABLET BY MOUTH AS NEEDED FOR  ERECTILE  DYSFUNCTION 10 tablet 5  . Vitamin D, Ergocalciferol, (DRISDOL) 1.25 MG (50000 UNIT) CAPS capsule Take 1 capsule (50,000 Units total) by mouth every 7 (seven) days. (Patient taking differently: Take 50,000 Units by mouth every Sunday. ) 12 capsule 0   No facility-administered medications prior to visit.    Review of Systems  Constitutional: Negative for chills, fever, malaise/fatigue and weight loss.  HENT: Negative for hearing loss, sore throat and tinnitus.   Eyes: Negative for blurred vision and double vision.  Respiratory: Positive for cough and shortness of breath. Negative for hemoptysis, sputum production, wheezing and stridor.   Cardiovascular: Negative for chest pain, palpitations, orthopnea, leg swelling and PND.  Gastrointestinal: Negative for abdominal pain, constipation, diarrhea, heartburn, nausea and vomiting.  Genitourinary: Negative for dysuria, hematuria and urgency.  Musculoskeletal: Negative for joint pain and myalgias.  Skin: Negative for itching and rash.  Neurological: Negative for dizziness, tingling, weakness and headaches.  Endo/Heme/Allergies: Negative for environmental allergies. Does not bruise/bleed easily.  Psychiatric/Behavioral: Negative for depression. The patient is not nervous/anxious  and does not have insomnia.   All other systems reviewed and are negative.    Objective:  Physical Exam Vitals reviewed.  Constitutional:      General: He is not in acute distress.    Appearance: He is well-developed.  HENT:     Head: Normocephalic and atraumatic.     Mouth/Throat:     Pharynx: No oropharyngeal exudate.  Eyes:     Conjunctiva/sclera: Conjunctivae normal.     Pupils: Pupils are equal, round, and reactive to light.  Neck:     Vascular: No JVD.     Trachea: No tracheal deviation.     Comments: Loss of supraclavicular fat Cardiovascular:     Rate and Rhythm: Normal rate and regular rhythm.     Heart sounds: S1 normal and S2 normal.     Comments: Distant heart tones Pulmonary:     Effort: No tachypnea or accessory muscle usage.     Breath sounds: No stridor. Decreased breath sounds (throughout all lung fields) present. No wheezing, rhonchi or rales.  Abdominal:     General: Bowel sounds are normal. There is no distension.     Palpations: Abdomen is soft.     Tenderness: There is no abdominal tenderness.  Musculoskeletal:        General: Deformity (muscle wasting ) present.  Skin:    General: Skin is warm and dry.     Capillary Refill: Capillary refill takes less than 2 seconds.     Findings: No rash.  Neurological:     Mental Status: He is alert and oriented to person, place, and time.  Psychiatric:        Behavior: Behavior normal.      Vitals:   05/02/20 1406  BP: (!) 124/52  Pulse: (!) 102  Temp: 97.9 F (36.6 C)  TempSrc: Tympanic  SpO2: 97%  Weight: 151 lb (68.5 kg)   97% on RA BMI Readings from Last 3 Encounters:  05/02/20 20.48 kg/m  05/01/20 20.55 kg/m  04/24/20 20.85 kg/m   Wt Readings from Last 3 Encounters:  05/02/20 151 lb (68.5 kg)  05/01/20 151 lb 8 oz (68.7 kg)  04/24/20 153 lb 12 oz (69.7 kg)     CBC    Component Value Date/Time   WBC 8.1 05/01/2020 0955  WBC 8.1 03/30/2020 0603   RBC 4.51 05/01/2020 0955    HGB 12.6 (L) 05/01/2020 0955   HCT 36.9 (L) 05/01/2020 0955   PLT 102 (L) 05/01/2020 0955   MCV 81.8 05/01/2020 0955   MCH 27.9 05/01/2020 0955   MCHC 34.1 05/01/2020 0955   RDW 15.3 05/01/2020 0955   LYMPHSABS 1.5 05/01/2020 0955   MONOABS 0.4 05/01/2020 0955   EOSABS 0.2 05/01/2020 0955   BASOSABS 0.0 05/01/2020 0955     Chest Imaging:  03/16/2020: CT scan of the chest Multiple bilateral solid and subsolid nodules with a left lower lobe cavitary lesion. Concerning for multiple areas of carcinoma. The patient's images have been independently reviewed by me.    Pulmonary Functions Testing Results: No flowsheet data found.  FeNO:   Pathology:   Echocardiogram:   Heart Catheterization:     Assessment & Plan:     ICD-10-CM   1. Non-small cell carcinoma of right lung, stage 4 (HCC)  C34.91 Pulmonary Function Test  2. Former smoker  Z87.891   3. Brain metastases ([place])  C79.31     Discussion:  This is a 56 year old gentleman, new diagnosis since last office visit of stage IV non-small cell lung cancer.  Currently undergoing chemotherapy.  He has also had brain radiation  Plan: We will get PFTs return of next office visit. He likely has underlying COPD. At this time was offered samples of inhalers but he thinks that his breathing is stable at the moment. He is to monitor symptoms and if anything changes were happy to send something in for him. Routine follow-up with medical oncology to continue chemotherapy and immunotherapy. Patient to call us if he has any changes in his respiratory status. Also being monitored at this time for any immune related adverse events while being on Keytruda.    Current Outpatient Medications:  .  amLODipine-benazepril (LOTREL) 10-20 MG capsule, TAKE 1 CAPSULE BY MOUTH ONCE DAILY . APPOINTMENT REQUIRED FOR FUTURE REFILLS, Disp: 30 capsule, Rfl: 0 .  aspirin EC 81 MG tablet, Take 81 mg by mouth daily. Swallow whole., Disp: , Rfl:  .  b  complex vitamins tablet, Take 1 tablet by mouth daily., Disp: , Rfl:  .  dexamethasone (DECADRON) 4 MG tablet, Take 1 tablet (4 mg total) by mouth 3 (three) times daily., Disp: 90 tablet, Rfl: 0 .  folic acid (FOLVITE) 1 MG tablet, Take 1 tablet (1 mg total) by mouth daily., Disp: 30 tablet, Rfl: 4 .  Garlic 0962 MG CAPS, Take 1,000 mg by mouth daily. , Disp: , Rfl:  .  lidocaine-prilocaine (EMLA) cream, Apply to the Port-A-Cath site 30-60 minutes before chemotherapy., Disp: 30 g, Rfl: 0 .  Omega-3 Fatty Acids (FISH OIL PO), Take 1 capsule by mouth daily. , Disp: , Rfl:  .  pravastatin (PRAVACHOL) 40 MG tablet, TAKE 1 TABLET BY MOUTH ONCE DAILY . APPOINTMENT REQUIRED FOR FUTURE REFILLS, Disp: 30 tablet, Rfl: 0 .  prochlorperazine (COMPAZINE) 10 MG tablet, Take 1 tablet (10 mg total) by mouth every 6 (six) hours as needed for nausea or vomiting., Disp: 30 tablet, Rfl: 0 .  vardenafil (LEVITRA) 20 MG tablet, TAKE 1 TABLET BY MOUTH AS NEEDED FOR  ERECTILE  DYSFUNCTION, Disp: 10 tablet, Rfl: 5 .  Vitamin D, Ergocalciferol, (DRISDOL) 1.25 MG (50000 UNIT) CAPS capsule, Take 1 capsule (50,000 Units total) by mouth every 7 (seven) days. (Patient taking differently: Take 50,000 Units by mouth every Sunday. ), Disp: 12 capsule, Rfl:  0    Garner Nash, DO [facility] 05/02/2020 2:11 PM

## 2020-05-02 NOTE — Patient Instructions (Addendum)
Thank you for visiting Dr. Valeta Harms at [facility]. Today we recommend the following:  Orders Placed This Encounter  Procedures  . Pulmonary Function Test   Return in about 3 months (around 08/02/2020) for Dr. Valeta Harms . Please have PFTs prior to next office visit in Feb 2022    Please do your part to reduce the spread of COVID-19.

## 2020-05-07 ENCOUNTER — Other Ambulatory Visit: Payer: Self-pay | Admitting: Internal Medicine

## 2020-05-07 NOTE — Telephone Encounter (Signed)
Please refill as per office routine med refill policy (all routine meds refilled for 3 mo or monthly per pt preference up to one year from last visit, then month to month grace period for 3 mo, then further med refills will have to be denied)  

## 2020-05-08 ENCOUNTER — Other Ambulatory Visit: Payer: Self-pay

## 2020-05-08 ENCOUNTER — Telehealth: Payer: Self-pay | Admitting: Medical Oncology

## 2020-05-08 ENCOUNTER — Inpatient Hospital Stay: Payer: 59

## 2020-05-08 DIAGNOSIS — Z5112 Encounter for antineoplastic immunotherapy: Secondary | ICD-10-CM | POA: Diagnosis not present

## 2020-05-08 DIAGNOSIS — C3491 Malignant neoplasm of unspecified part of right bronchus or lung: Secondary | ICD-10-CM

## 2020-05-08 DIAGNOSIS — Z95828 Presence of other vascular implants and grafts: Secondary | ICD-10-CM

## 2020-05-08 LAB — CBC WITH DIFFERENTIAL (CANCER CENTER ONLY)
Abs Immature Granulocytes: 0.08 10*3/uL — ABNORMAL HIGH (ref 0.00–0.07)
Basophils Absolute: 0 10*3/uL (ref 0.0–0.1)
Basophils Relative: 0 %
Eosinophils Absolute: 0.2 10*3/uL (ref 0.0–0.5)
Eosinophils Relative: 2 %
HCT: 36 % — ABNORMAL LOW (ref 39.0–52.0)
Hemoglobin: 12.3 g/dL — ABNORMAL LOW (ref 13.0–17.0)
Immature Granulocytes: 1 %
Lymphocytes Relative: 29 %
Lymphs Abs: 2 10*3/uL (ref 0.7–4.0)
MCH: 28.3 pg (ref 26.0–34.0)
MCHC: 34.2 g/dL (ref 30.0–36.0)
MCV: 82.9 fL (ref 80.0–100.0)
Monocytes Absolute: 0.6 10*3/uL (ref 0.1–1.0)
Monocytes Relative: 8 %
Neutro Abs: 4.1 10*3/uL (ref 1.7–7.7)
Neutrophils Relative %: 60 %
Platelet Count: 119 10*3/uL — ABNORMAL LOW (ref 150–400)
RBC: 4.34 MIL/uL (ref 4.22–5.81)
RDW: 16 % — ABNORMAL HIGH (ref 11.5–15.5)
WBC Count: 6.9 10*3/uL (ref 4.0–10.5)
nRBC: 0 % (ref 0.0–0.2)

## 2020-05-08 LAB — CMP (CANCER CENTER ONLY)
ALT: 137 U/L — ABNORMAL HIGH (ref 0–44)
AST: 60 U/L — ABNORMAL HIGH (ref 15–41)
Albumin: 3.5 g/dL (ref 3.5–5.0)
Alkaline Phosphatase: 56 U/L (ref 38–126)
Anion gap: 10 (ref 5–15)
BUN: 18 mg/dL (ref 8–23)
CO2: 26 mmol/L (ref 22–32)
Calcium: 9.2 mg/dL (ref 8.9–10.3)
Chloride: 103 mmol/L (ref 98–111)
Creatinine: 1.22 mg/dL (ref 0.61–1.24)
GFR, Estimated: >60 mL/min (ref >60)
Glucose, Bld: 75 mg/dL (ref 70–99)
Potassium: 4.2 mmol/L (ref 3.5–5.1)
Sodium: 139 mmol/L (ref 135–145)
Total Bilirubin: 0.3 mg/dL (ref 0.3–1.2)
Total Protein: 6.8 g/dL (ref 6.5–8.1)

## 2020-05-08 MED ORDER — SODIUM CHLORIDE 0.9% FLUSH
10.0000 mL | Freq: Once | INTRAVENOUS | Status: AC
  Administered 2020-05-08: 10 mL
  Filled 2020-05-08: qty 10

## 2020-05-08 MED ORDER — HEPARIN SOD (PORK) LOCK FLUSH 100 UNIT/ML IV SOLN
500.0000 [IU] | Freq: Once | INTRAVENOUS | Status: AC
  Administered 2020-05-08: 500 [IU]
  Filled 2020-05-08: qty 5

## 2020-05-08 NOTE — Telephone Encounter (Signed)
Instructed to monitor BM for now and to call if they increase to watery diarrhea >4 times a day and to acquire Imodium to have on hand at home.

## 2020-05-08 NOTE — Progress Notes (Signed)
Diane RN notified of pt "watery" bowel movement on 05/07/2020

## 2020-05-08 NOTE — Telephone Encounter (Signed)
Pt reported to [facility] that he started having change in his BM  last night. He reports 1 episode of soft stool then liquid. Denies abdominal pain. No other episodes.

## 2020-05-09 ENCOUNTER — Encounter: Payer: Self-pay | Admitting: Internal Medicine

## 2020-05-09 NOTE — Progress Notes (Signed)
Pthas been enrolledw/ [employer] for [employer] for $25,000 from 06/17/19- 06/15/20.His copay for [name] will be $25.

## 2020-05-14 ENCOUNTER — Encounter (HOSPITAL_COMMUNITY): Payer: Self-pay | Admitting: Pulmonary Disease

## 2020-05-15 ENCOUNTER — Inpatient Hospital Stay: Payer: 59

## 2020-05-15 ENCOUNTER — Other Ambulatory Visit: Payer: Self-pay | Admitting: Internal Medicine

## 2020-05-15 ENCOUNTER — Other Ambulatory Visit: Payer: 59

## 2020-05-15 ENCOUNTER — Inpatient Hospital Stay: Payer: 59 | Admitting: Internal Medicine

## 2020-05-15 ENCOUNTER — Encounter: Payer: Self-pay | Admitting: Internal Medicine

## 2020-05-15 ENCOUNTER — Other Ambulatory Visit: Payer: Self-pay

## 2020-05-15 VITALS — BP 110/63 | HR 83 | Temp 98.1°F | Resp 18 | Ht 72.0 in | Wt 155.4 lb

## 2020-05-15 DIAGNOSIS — I10 Essential (primary) hypertension: Secondary | ICD-10-CM | POA: Diagnosis not present

## 2020-05-15 DIAGNOSIS — Z5111 Encounter for antineoplastic chemotherapy: Secondary | ICD-10-CM

## 2020-05-15 DIAGNOSIS — Z5112 Encounter for antineoplastic immunotherapy: Secondary | ICD-10-CM

## 2020-05-15 DIAGNOSIS — C3491 Malignant neoplasm of unspecified part of right bronchus or lung: Secondary | ICD-10-CM

## 2020-05-15 LAB — CMP (CANCER CENTER ONLY)
ALT: 105 U/L — ABNORMAL HIGH (ref 0–44)
AST: 46 U/L — ABNORMAL HIGH (ref 15–41)
Albumin: 3.6 g/dL (ref 3.5–5.0)
Alkaline Phosphatase: 57 U/L (ref 38–126)
Anion gap: 8 (ref 5–15)
BUN: 13 mg/dL (ref 8–23)
CO2: 27 mmol/L (ref 22–32)
Calcium: 9.6 mg/dL (ref 8.9–10.3)
Chloride: 103 mmol/L (ref 98–111)
Creatinine: 0.9 mg/dL (ref 0.61–1.24)
GFR, Estimated: >60 mL/min (ref >60)
Glucose, Bld: 74 mg/dL (ref 70–99)
Potassium: 3.9 mmol/L (ref 3.5–5.1)
Sodium: 138 mmol/L (ref 135–145)
Total Bilirubin: 0.3 mg/dL (ref 0.3–1.2)
Total Protein: 7.2 g/dL (ref 6.5–8.1)

## 2020-05-15 LAB — CBC WITH DIFFERENTIAL (CANCER CENTER ONLY)
Abs Immature Granulocytes: 0.58 10*3/uL — ABNORMAL HIGH (ref 0.00–0.07)
Basophils Absolute: 0 10*3/uL (ref 0.0–0.1)
Basophils Relative: 0 %
Eosinophils Absolute: 0.1 10*3/uL (ref 0.0–0.5)
Eosinophils Relative: 2 %
HCT: 36.8 % — ABNORMAL LOW (ref 39.0–52.0)
Hemoglobin: 12.5 g/dL — ABNORMAL LOW (ref 13.0–17.0)
Immature Granulocytes: 7 %
Lymphocytes Relative: 31 %
Lymphs Abs: 2.7 10*3/uL (ref 0.7–4.0)
MCH: 27.9 pg (ref 26.0–34.0)
MCHC: 34 g/dL (ref 30.0–36.0)
MCV: 82.1 fL (ref 80.0–100.0)
Monocytes Absolute: 1 10*3/uL (ref 0.1–1.0)
Monocytes Relative: 12 %
Neutro Abs: 4.2 10*3/uL (ref 1.7–7.7)
Neutrophils Relative %: 48 %
Platelet Count: 207 10*3/uL (ref 150–400)
RBC: 4.48 MIL/uL (ref 4.22–5.81)
RDW: 16.6 % — ABNORMAL HIGH (ref 11.5–15.5)
WBC Count: 8.6 10*3/uL (ref 4.0–10.5)
nRBC: 0 % (ref 0.0–0.2)

## 2020-05-15 LAB — TSH: TSH: 1.175 u[IU]/mL (ref 0.320–4.118)

## 2020-05-15 MED ORDER — SODIUM CHLORIDE 0.9 % IV SOLN
150.0000 mg | Freq: Once | INTRAVENOUS | Status: AC
  Administered 2020-05-15: 150 mg via INTRAVENOUS
  Filled 2020-05-15: qty 150

## 2020-05-15 MED ORDER — SODIUM CHLORIDE 0.9 % IV SOLN
Freq: Once | INTRAVENOUS | Status: AC
  Filled 2020-05-15: qty 250

## 2020-05-15 MED ORDER — HEPARIN SOD (PORK) LOCK FLUSH 100 UNIT/ML IV SOLN
500.0000 [IU] | Freq: Once | INTRAVENOUS | Status: AC | PRN
  Administered 2020-05-15: 500 [IU]
  Filled 2020-05-15: qty 5

## 2020-05-15 MED ORDER — SODIUM CHLORIDE 0.9 % IV SOLN
520.0000 mg | Freq: Once | INTRAVENOUS | Status: AC
  Administered 2020-05-15: 520 mg via INTRAVENOUS
  Filled 2020-05-15: qty 52

## 2020-05-15 MED ORDER — SODIUM CHLORIDE 0.9 % IV SOLN
500.0000 mg/m2 | Freq: Once | INTRAVENOUS | Status: AC
  Administered 2020-05-15: 900 mg via INTRAVENOUS
  Filled 2020-05-15: qty 20

## 2020-05-15 MED ORDER — SODIUM CHLORIDE 0.9 % IV SOLN
16.0000 mg | Freq: Once | INTRAVENOUS | Status: AC
  Administered 2020-05-15: 16 mg via INTRAVENOUS
  Filled 2020-05-15: qty 8

## 2020-05-15 MED ORDER — SODIUM CHLORIDE 0.9 % IV SOLN
10.0000 mg | Freq: Once | INTRAVENOUS | Status: AC
  Administered 2020-05-15: 10 mg via INTRAVENOUS
  Filled 2020-05-15: qty 10

## 2020-05-15 MED ORDER — SODIUM CHLORIDE 0.9 % IV SOLN
200.0000 mg | Freq: Once | INTRAVENOUS | Status: AC
  Administered 2020-05-15: 200 mg via INTRAVENOUS
  Filled 2020-05-15: qty 8

## 2020-05-15 MED ORDER — SODIUM CHLORIDE 0.9% FLUSH
10.0000 mL | INTRAVENOUS | Status: DC | PRN
  Administered 2020-05-15: 10 mL
  Filled 2020-05-15: qty 10

## 2020-05-15 NOTE — Progress Notes (Signed)
ALT elevated but stable per Dr. Julien Nordmann.  Will continue w/ planned tx, including Keytruda.  Kennith Center, Pharm.D., CPP 05/15/2020@1 :16 PM

## 2020-05-15 NOTE — Progress Notes (Signed)
Patient discharged in stable condition.

## 2020-05-15 NOTE — Patient Instructions (Signed)
[facility] Discharge Instructions for Patients Receiving Chemotherapy  Today you received the following chemotherapy agents: pembrolizumab/pemetrexed/carboplatin.  To help prevent nausea and vomiting after your treatment, we encourage you to take your nausea medication as directed.  If you develop nausea and vomiting that is not controlled by your nausea medication, call the clinic.   BELOW ARE SYMPTOMS THAT SHOULD BE REPORTED IMMEDIATELY:  *FEVER GREATER THAN 100.5 F  *CHILLS WITH OR WITHOUT FEVER  NAUSEA AND VOMITING THAT IS NOT CONTROLLED WITH YOUR NAUSEA MEDICATION  *UNUSUAL SHORTNESS OF BREATH  *UNUSUAL BRUISING OR BLEEDING  TENDERNESS IN MOUTH AND THROAT WITH OR WITHOUT PRESENCE OF ULCERS  *URINARY PROBLEMS  *BOWEL PROBLEMS  UNUSUAL RASH Items with * indicate a potential emergency and should be followed up as soon as possible.  Feel free to call the clinic should you have any questions or concerns. The clinic phone number is [phone].  Please show the [facility] at check-in to the Emergency Department and triage nurse.

## 2020-05-15 NOTE — Progress Notes (Signed)
[facility] Telephone:([phone]   Fax:(336) [phone]  OFFICE PROGRESS NOTE  [name], MD [place] [address]  DIAGNOSIS: Stage IV (T3, N2, M1c) non-small cell lung cancer favoring adenocarcinoma presented with right upper lobe lung mass in addition to right lower lobe, left lower lobe in addition to right hilar and mediastinal lymphadenopathy in addition to metastatic brain lesions diagnosed in October 2021.  Molecular studies by [employer]:  [id number], 9.5%,   PRIOR THERAPY: SRS to 3 brain lesion under the care of Dr. Lisbeth Renshaw.  Scheduled for April 20, 2020  CURRENT THERAPY: Systemic chemotherapy with carboplatin for AUC of 5, Alimta 500 mg/M2 and Keytruda 200 mg IV every 3 weeks.  First dose April 24, 2020.  Status post 1 cycle.  INTERVAL HISTORY: Nicolas Allen 64 y.o. male returns to the clinic today for follow-up visit accompanied by his wife.  The patient is feeling fine today with no concerning complaints.  He tolerated the first cycle of his systemic chemotherapy with carboplatin, Alimta and Keytruda fairly well.  He had headache in the last few days and he was taking ibuprofen as needed basis with improvement of his symptoms.  He denied having any chest pain, shortness of breath, cough or hemoptysis.  He denied having any nausea, vomiting, diarrhea or constipation.  He is here today for evaluation before starting cycle #2.  MEDICAL HISTORY: Past Medical History:  Diagnosis Date  . Anemia 06/24/2012  . ANXIETY 02/03/2007  . Cervical disc disease 02/12/2012   S/p surgury jan 2013  . Erectile dysfunction 02/11/2011  . GERD (gastroesophageal reflux disease)   . GLUCOSE INTOLERANCE 01/04/2008  . HYPERLIPIDEMIA 02/03/2007  . HYPERTENSION 02/03/2007  . IBS (irritable bowel syndrome)   . Impaired glucose tolerance 02/11/2011  . Smoker 08/22/2014  . Substance abuse ([place]) 2001   sober for 16 yrs    ALLERGIES:  has No Known  Allergies.  MEDICATIONS:  Current Outpatient Medications  Medication Sig Dispense Refill  . ibuprofen (ADVIL) 200 MG tablet Take 200 mg by mouth every 6 (six) hours as needed.    Marland Kitchen amLODipine-benazepril (LOTREL) 10-20 MG capsule TAKE 1 CAPSULE BY MOUTH ONCE DAILY . APPOINTMENT REQUIRED FOR FUTURE REFILLS 30 capsule 0  . aspirin EC 81 MG tablet Take 81 mg by mouth daily. Swallow whole.    . b complex vitamins tablet Take 1 tablet by mouth daily.    Marland Kitchen dexamethasone (DECADRON) 4 MG tablet Take 1 tablet (4 mg total) by mouth 3 (three) times daily. 90 tablet 0  . folic acid (FOLVITE) 1 MG tablet Take 1 tablet (1 mg total) by mouth daily. 30 tablet 4  . Garlic 0017 MG CAPS Take 1,000 mg by mouth daily.     Marland Kitchen lidocaine-prilocaine (EMLA) cream Apply to the Port-A-Cath site 30-60 minutes before chemotherapy. 30 g 0  . Omega-3 Fatty Acids (FISH OIL PO) Take 1 capsule by mouth daily.     . pravastatin (PRAVACHOL) 40 MG tablet TAKE 1 TABLET BY MOUTH ONCE DAILY . APPOINTMENT REQUIRED FOR FUTURE REFILLS 30 tablet 0  . prochlorperazine (COMPAZINE) 10 MG tablet Take 1 tablet (10 mg total) by mouth every 6 (six) hours as needed for nausea or vomiting. 30 tablet 0  . vardenafil (LEVITRA) 20 MG tablet TAKE 1 TABLET BY MOUTH AS NEEDED FOR  ERECTILE  DYSFUNCTION 10 tablet 5  . Vitamin D, Ergocalciferol, (DRISDOL) 1.25 MG (50000 UNIT) CAPS capsule  Take 1 capsule (50,000 Units total) by mouth every 7 (seven) days. (Patient taking differently: Take 50,000 Units by mouth every Sunday. ) 12 capsule 0   No current facility-administered medications for this visit.    SURGICAL HISTORY:  Past Surgical History:  Procedure Laterality Date  . BRONCHIAL BIOPSY  03/30/2020   Procedure: BRONCHIAL BIOPSIES;  Surgeon: Garner Nash, DO;  Location: [facility];  Service: Pulmonary;;  . BRONCHIAL BRUSHINGS  03/30/2020   Procedure: BRONCHIAL BRUSHINGS;  Surgeon: Garner Nash, DO;  Location: [facility];  Service:  Pulmonary;;  . BRONCHIAL NEEDLE ASPIRATION BIOPSY  03/30/2020   Procedure: BRONCHIAL NEEDLE ASPIRATION BIOPSIES;  Surgeon: Garner Nash, DO;  Location: [facility];  Service: Pulmonary;;  . BRONCHIAL WASHINGS  03/30/2020   Procedure: BRONCHIAL WASHINGS;  Surgeon: Garner Nash, DO;  Location: [facility];  Service: Pulmonary;;  . COLONOSCOPY  2007  . IR IMAGING GUIDED PORT INSERTION  04/23/2020  . neck fusion  2012   C4  . NO PAST SURGERIES    . VIDEO BRONCHOSCOPY WITH ENDOBRONCHIAL NAVIGATION N/A 03/30/2020   Procedure: VIDEO BRONCHOSCOPY WITH ENDOBRONCHIAL NAVIGATION;  Surgeon: Garner Nash, DO;  Location: [facility];  Service: Pulmonary;  Laterality: N/A;  . VIDEO BRONCHOSCOPY WITH ENDOBRONCHIAL ULTRASOUND N/A 03/30/2020   Procedure: VIDEO BRONCHOSCOPY WITH ENDOBRONCHIAL ULTRASOUND;  Surgeon: Garner Nash, DO;  Location: [facility];  Service: Pulmonary;  Laterality: N/A;    REVIEW OF SYSTEMS:  A comprehensive review of systems was negative.   PHYSICAL EXAMINATION: General appearance: alert, cooperative, fatigued and no distress Head: Normocephalic, without obvious abnormality, atraumatic Neck: no adenopathy, no JVD, supple, symmetrical, trachea midline and thyroid not enlarged, symmetric, no tenderness/mass/nodules Lymph nodes: Cervical, supraclavicular, and axillary nodes normal. Resp: clear to auscultation bilaterally Back: symmetric, no curvature. ROM normal. No CVA tenderness. Cardio: regular rate and rhythm, S1, S2 normal, no murmur, click, rub or gallop GI: soft, non-tender; bowel sounds normal; no masses,  no organomegaly Extremities: extremities normal, atraumatic, no cyanosis or edema  ECOG PERFORMANCE STATUS: 1 - Symptomatic but completely ambulatory  Blood pressure 110/63, pulse 83, temperature 98.1 F (36.7 C), temperature source Tympanic, resp. rate 18, height 6' (1.829 m), weight 155 lb 6.4 oz (70.5 kg), SpO2 100 %.  LABORATORY DATA: Lab Results   Component Value Date   WBC 8.6 05/15/2020   HGB 12.5 (L) 05/15/2020   HCT 36.8 (L) 05/15/2020   MCV 82.1 05/15/2020   PLT 207 05/15/2020      Chemistry      Component Value Date/Time   NA 138 05/15/2020 1148   K 3.9 05/15/2020 1148   CL 103 05/15/2020 1148   CO2 27 05/15/2020 1148   BUN 13 05/15/2020 1148   CREATININE 0.90 05/15/2020 1148   CREATININE 0.88 02/21/2020 1115      Component Value Date/Time   CALCIUM 9.6 05/15/2020 1148   ALKPHOS 57 05/15/2020 1148   AST 46 (H) 05/15/2020 1148   ALT 105 (H) 05/15/2020 1148   BILITOT 0.3 05/15/2020 1148       RADIOGRAPHIC STUDIES: IR IMAGING GUIDED PORT INSERTION  Result Date: 04/23/2020 INDICATION: 64 year old male with stage IV non-small cell lung cancer. He presents for placement of a port catheter. EXAM: IMPLANTED PORT A CATH PLACEMENT WITH ULTRASOUND AND FLUOROSCOPIC GUIDANCE MEDICATIONS: 2 g Ancef; The antibiotic was administered within an appropriate time interval prior to skin puncture. ANESTHESIA/SEDATION: Versed 3 mg IV; Fentanyl 100 mcg IV; Moderate Sedation Time:  15 minutes  The patient was continuously monitored during the procedure by the interventional radiology nurse under my direct supervision. FLUOROSCOPY TIME:  0 minutes, 6 seconds (1 mGy) COMPLICATIONS: None immediate. PROCEDURE: The right neck and chest was prepped with chlorhexidine, and draped in the usual sterile fashion using maximum barrier technique (cap and mask, sterile gown, sterile gloves, large sterile sheet, hand hygiene and cutaneous antiseptic). Local anesthesia was attained by infiltration with 1% lidocaine with epinephrine. Ultrasound demonstrated patency of the right internal jugular vein, and this was documented with an image. Under real-time ultrasound guidance, this vein was accessed with a 21 gauge micropuncture needle and image documentation was performed. A small dermatotomy was made at the access site with an 11 scalpel. A 0.018" wire was  advanced into the SVC and the access needle exchanged for a 17F micropuncture vascular sheath. The 0.018" wire was then removed and a 0.035" wire advanced into the IVC. An appropriate location for the subcutaneous reservoir was selected below the clavicle and an incision was made through the skin and underlying soft tissues. The subcutaneous tissues were then dissected using a combination of blunt and sharp surgical technique and a pocket was formed. A single lumen power injectable portacatheter was then tunneled through the subcutaneous tissues from the pocket to the dermatotomy and the port reservoir placed within the subcutaneous pocket. The venous access site was then serially dilated and a peel away vascular sheath placed over the wire. The wire was removed and the port catheter advanced into position under fluoroscopic guidance. The catheter tip is positioned in the superior cavoatrial junction. This was documented with a spot image. The portacatheter was then tested and found to flush and aspirate well. The port was flushed with saline followed by 100 units/mL heparinized saline. The pocket was then closed in two layers using first subdermal inverted interrupted absorbable sutures followed by a running subcuticular suture. The epidermis was then sealed with Dermabond. The dermatotomy at the venous access site was also closed with Dermabond. IMPRESSION: Successful placement of a right IJ approach Power Port with ultrasound and fluoroscopic guidance. The catheter is ready for use. Electronically Signed   By: Jacqulynn Cadet M.D.   On: 04/23/2020 16:03    ASSESSMENT AND PLAN: This is a very pleasant 64 years old African-American male recently diagnosed with a stage IV (T3, N2, M1c)  non-small cell lung cancer, adenocarcinoma presented with right upper lobe lung mass in addition to right lower lobe, left lower lobe in addition to right hilar and mediastinal lymphadenopathy in addition to metastatic brain  lesions diagnosed in October 2021. The patient has molecular studies by [employer] that showed no actionable mutations. He underwent SRS treatment to his brain lesion. The patient is currently undergoing systemic chemotherapy with carboplatin for AUC of 5, Alimta 500 mg/M2 and Keytruda 200 mg IV every 3 weeks status post 1 cycle.  He tolerated the first cycle of his treatment well with no concerning adverse effects. I recommended for him to proceed with cycle #2 today as planned. For the headache I advised him to use Tylenol as needed and to avoid ibuprofen during the treatment with Alimta. The patient will come back for follow-up visit in 3 weeks for evaluation before starting cycle #3. He was advised to call immediately if he has any concerning symptoms in the interval. The patient voices understanding of current disease status and treatment options and is in agreement with the current care plan.  All questions were answered. The patient knows  to call the clinic with any problems, questions or concerns. We can certainly see the patient much sooner if necessary.  Disclaimer: This note was dictated with voice recognition software. Similar sounding words can inadvertently be transcribed and may not be corrected upon review.

## 2020-05-17 ENCOUNTER — Telehealth: Payer: Self-pay | Admitting: Internal Medicine

## 2020-05-17 LAB — ACID FAST CULTURE WITH REFLEXED SENSITIVITIES (MYCOBACTERIA): Acid Fast Culture: NEGATIVE

## 2020-05-17 NOTE — Telephone Encounter (Signed)
Scheduled per los. Called, not able to leave msg. Mailed printout  

## 2020-05-21 ENCOUNTER — Ambulatory Visit
Admission: RE | Admit: 2020-05-21 | Discharge: 2020-05-21 | Disposition: A | Discharge status: Home / Self Care | Payer: 59 | Source: Ambulatory Visit | Attending: Radiation Oncology | Admitting: Radiation Oncology

## 2020-05-21 DIAGNOSIS — C7931 Secondary malignant neoplasm of brain: Secondary | ICD-10-CM

## 2020-05-21 DIAGNOSIS — C3491 Malignant neoplasm of unspecified part of right bronchus or lung: Secondary | ICD-10-CM

## 2020-05-21 NOTE — Addendum Note (Signed)
Encounter addended by: Hayden Pedro, PA-C on: 05/21/2020 1:18 PM  Actions taken: Clinical Note Signed

## 2020-05-21 NOTE — Progress Notes (Addendum)
  Radiation Oncology         (336) [phone] ________________________________  Name: Nicolas Allen MRN: [id number]  Date of Service: 05/21/2020  DOB: 1955/07/17  Post Treatment Telephone Note  Diagnosis:  Stage IV non-small cell lung cancer, adenocarcinoma involving multifocal disease in the lung and 3 brain metastases.  Interval Since Last Radiation:  4 weeks   04/20/20 SRS Treatment: Each site below was treated to 20 Gy in 1 fraction PTV1 Rt Cerebellum 68mm PTV2Mid cerebellum 58mm PTV3Lt Parietal 62mm  Narrative:  The patient was contacted today for routine follow-up. During treatment he did very well with radiotherapy and did not have significant desquamation. He reports he is doing very well. He has had some constipation from [facility] and is managing this but otherwise is doing very well per report. He denies any headaches or changes in movement or sensation since radiation was administered.  Impression/Plan: 1. Stage IV non-small cell lung cancer, adenocarcinoma involving multifocal disease in the lung and 3 brain metastases. The patient has been doing well since completion of radiotherapy. We discussed that we would plan to repeat an MRI of the brain in about 2 months. He will also continue to follow up with Dr. Julien Nordmann in medical oncology as he continues with Alimta/Keytruda systemic therapy.     Carola Rhine, PAC

## 2020-05-22 ENCOUNTER — Inpatient Hospital Stay: Payer: 59 | Attending: Internal Medicine

## 2020-05-22 ENCOUNTER — Inpatient Hospital Stay: Payer: 59

## 2020-05-22 ENCOUNTER — Other Ambulatory Visit: Payer: Self-pay

## 2020-05-22 DIAGNOSIS — C3432 Malignant neoplasm of lower lobe, left bronchus or lung: Secondary | ICD-10-CM | POA: Diagnosis not present

## 2020-05-22 DIAGNOSIS — C3431 Malignant neoplasm of lower lobe, right bronchus or lung: Secondary | ICD-10-CM | POA: Insufficient documentation

## 2020-05-22 DIAGNOSIS — C3491 Malignant neoplasm of unspecified part of right bronchus or lung: Secondary | ICD-10-CM

## 2020-05-22 DIAGNOSIS — Z87891 Personal history of nicotine dependence: Secondary | ICD-10-CM | POA: Insufficient documentation

## 2020-05-22 DIAGNOSIS — C3411 Malignant neoplasm of upper lobe, right bronchus or lung: Secondary | ICD-10-CM | POA: Insufficient documentation

## 2020-05-22 DIAGNOSIS — Z95828 Presence of other vascular implants and grafts: Secondary | ICD-10-CM

## 2020-05-22 DIAGNOSIS — Z5112 Encounter for antineoplastic immunotherapy: Secondary | ICD-10-CM | POA: Insufficient documentation

## 2020-05-22 DIAGNOSIS — Z79899 Other long term (current) drug therapy: Secondary | ICD-10-CM | POA: Diagnosis not present

## 2020-05-22 DIAGNOSIS — Z5111 Encounter for antineoplastic chemotherapy: Secondary | ICD-10-CM | POA: Diagnosis present

## 2020-05-22 LAB — CBC WITH DIFFERENTIAL (CANCER CENTER ONLY)
Abs Immature Granulocytes: 0.06 10*3/uL (ref 0.00–0.07)
Basophils Absolute: 0 10*3/uL (ref 0.0–0.1)
Basophils Relative: 1 %
Eosinophils Absolute: 0.1 10*3/uL (ref 0.0–0.5)
Eosinophils Relative: 2 %
HCT: 36.7 % — ABNORMAL LOW (ref 39.0–52.0)
Hemoglobin: 12.6 g/dL — ABNORMAL LOW (ref 13.0–17.0)
Immature Granulocytes: 2 %
Lymphocytes Relative: 46 %
Lymphs Abs: 1.7 10*3/uL (ref 0.7–4.0)
MCH: 28.5 pg (ref 26.0–34.0)
MCHC: 34.3 g/dL (ref 30.0–36.0)
MCV: 83 fL (ref 80.0–100.0)
Monocytes Absolute: 0.5 10*3/uL (ref 0.1–1.0)
Monocytes Relative: 13 %
Neutro Abs: 1.3 10*3/uL — ABNORMAL LOW (ref 1.7–7.7)
Neutrophils Relative %: 36 %
Platelet Count: 156 10*3/uL (ref 150–400)
RBC: 4.42 MIL/uL (ref 4.22–5.81)
RDW: 16 % — ABNORMAL HIGH (ref 11.5–15.5)
WBC Count: 3.7 10*3/uL — ABNORMAL LOW (ref 4.0–10.5)
nRBC: 0 % (ref 0.0–0.2)

## 2020-05-22 LAB — CMP (CANCER CENTER ONLY)
ALT: 59 U/L — ABNORMAL HIGH (ref 0–44)
AST: 32 U/L (ref 15–41)
Albumin: 3.6 g/dL (ref 3.5–5.0)
Alkaline Phosphatase: 54 U/L (ref 38–126)
Anion gap: 7 (ref 5–15)
BUN: 15 mg/dL (ref 8–23)
CO2: 26 mmol/L (ref 22–32)
Calcium: 9.6 mg/dL (ref 8.9–10.3)
Chloride: 104 mmol/L (ref 98–111)
Creatinine: 0.96 mg/dL (ref 0.61–1.24)
GFR, Estimated: >60 mL/min (ref >60)
Glucose, Bld: 92 mg/dL (ref 70–99)
Potassium: 4.5 mmol/L (ref 3.5–5.1)
Sodium: 137 mmol/L (ref 135–145)
Total Bilirubin: 0.4 mg/dL (ref 0.3–1.2)
Total Protein: 7.5 g/dL (ref 6.5–8.1)

## 2020-05-22 MED ORDER — HEPARIN SOD (PORK) LOCK FLUSH 100 UNIT/ML IV SOLN
500.0000 [IU] | Freq: Once | INTRAVENOUS | Status: AC
  Administered 2020-05-22: 500 [IU]
  Filled 2020-05-22: qty 5

## 2020-05-22 MED ORDER — SODIUM CHLORIDE 0.9% FLUSH
10.0000 mL | Freq: Once | INTRAVENOUS | Status: AC
  Administered 2020-05-22: 10 mL
  Filled 2020-05-22: qty 10

## 2020-05-22 NOTE — Patient Instructions (Signed)

## 2020-05-29 ENCOUNTER — Inpatient Hospital Stay: Payer: 59

## 2020-05-29 ENCOUNTER — Other Ambulatory Visit: Payer: Self-pay

## 2020-05-29 DIAGNOSIS — Z95828 Presence of other vascular implants and grafts: Secondary | ICD-10-CM

## 2020-05-29 DIAGNOSIS — C3491 Malignant neoplasm of unspecified part of right bronchus or lung: Secondary | ICD-10-CM

## 2020-05-29 DIAGNOSIS — Z5112 Encounter for antineoplastic immunotherapy: Secondary | ICD-10-CM | POA: Diagnosis not present

## 2020-05-29 LAB — CBC WITH DIFFERENTIAL (CANCER CENTER ONLY)
Abs Immature Granulocytes: 0.07 10*3/uL (ref 0.00–0.07)
Basophils Absolute: 0 10*3/uL (ref 0.0–0.1)
Basophils Relative: 0 %
Eosinophils Absolute: 0.1 10*3/uL (ref 0.0–0.5)
Eosinophils Relative: 1 %
HCT: 34.8 % — ABNORMAL LOW (ref 39.0–52.0)
Hemoglobin: 12 g/dL — ABNORMAL LOW (ref 13.0–17.0)
Immature Granulocytes: 1 %
Lymphocytes Relative: 29 %
Lymphs Abs: 2.1 10*3/uL (ref 0.7–4.0)
MCH: 28.2 pg (ref 26.0–34.0)
MCHC: 34.5 g/dL (ref 30.0–36.0)
MCV: 81.7 fL (ref 80.0–100.0)
Monocytes Absolute: 1.2 10*3/uL — ABNORMAL HIGH (ref 0.1–1.0)
Monocytes Relative: 17 %
Neutro Abs: 3.6 10*3/uL (ref 1.7–7.7)
Neutrophils Relative %: 52 %
Platelet Count: 141 10*3/uL — ABNORMAL LOW (ref 150–400)
RBC: 4.26 MIL/uL (ref 4.22–5.81)
RDW: 16.3 % — ABNORMAL HIGH (ref 11.5–15.5)
WBC Count: 7 10*3/uL (ref 4.0–10.5)
nRBC: 0 % (ref 0.0–0.2)

## 2020-05-29 LAB — CMP (CANCER CENTER ONLY)
ALT: 44 U/L (ref 0–44)
AST: 30 U/L (ref 15–41)
Albumin: 3.5 g/dL (ref 3.5–5.0)
Alkaline Phosphatase: 49 U/L (ref 38–126)
Anion gap: 8 (ref 5–15)
BUN: 13 mg/dL (ref 8–23)
CO2: 25 mmol/L (ref 22–32)
Calcium: 9.2 mg/dL (ref 8.9–10.3)
Chloride: 103 mmol/L (ref 98–111)
Creatinine: 1.02 mg/dL (ref 0.61–1.24)
GFR, Estimated: >60 mL/min (ref >60)
Glucose, Bld: 111 mg/dL — ABNORMAL HIGH (ref 70–99)
Potassium: 4.1 mmol/L (ref 3.5–5.1)
Sodium: 136 mmol/L (ref 135–145)
Total Bilirubin: 0.4 mg/dL (ref 0.3–1.2)
Total Protein: 7.2 g/dL (ref 6.5–8.1)

## 2020-05-29 LAB — TSH: TSH: 1.24 u[IU]/mL (ref 0.320–4.118)

## 2020-05-29 MED ORDER — HEPARIN SOD (PORK) LOCK FLUSH 100 UNIT/ML IV SOLN
500.0000 [IU] | Freq: Once | INTRAVENOUS | Status: AC
  Administered 2020-05-29: 500 [IU]
  Filled 2020-05-29: qty 5

## 2020-05-29 MED ORDER — SODIUM CHLORIDE 0.9% FLUSH
10.0000 mL | Freq: Once | INTRAVENOUS | Status: AC
  Administered 2020-05-29: 10 mL
  Filled 2020-05-29: qty 10

## 2020-06-05 ENCOUNTER — Inpatient Hospital Stay: Payer: 59

## 2020-06-05 ENCOUNTER — Encounter: Payer: Self-pay | Admitting: Internal Medicine

## 2020-06-05 ENCOUNTER — Inpatient Hospital Stay: Payer: 59 | Admitting: Internal Medicine

## 2020-06-05 ENCOUNTER — Other Ambulatory Visit: Payer: Self-pay

## 2020-06-05 VITALS — BP 121/63 | HR 76 | Temp 97.8°F | Resp 18 | Ht 72.0 in | Wt 159.1 lb

## 2020-06-05 DIAGNOSIS — Z5111 Encounter for antineoplastic chemotherapy: Secondary | ICD-10-CM

## 2020-06-05 DIAGNOSIS — C3490 Malignant neoplasm of unspecified part of unspecified bronchus or lung: Secondary | ICD-10-CM | POA: Diagnosis not present

## 2020-06-05 DIAGNOSIS — Z5112 Encounter for antineoplastic immunotherapy: Secondary | ICD-10-CM | POA: Diagnosis not present

## 2020-06-05 DIAGNOSIS — C3491 Malignant neoplasm of unspecified part of right bronchus or lung: Secondary | ICD-10-CM

## 2020-06-05 DIAGNOSIS — C7931 Secondary malignant neoplasm of brain: Secondary | ICD-10-CM

## 2020-06-05 DIAGNOSIS — I10 Essential (primary) hypertension: Secondary | ICD-10-CM | POA: Diagnosis not present

## 2020-06-05 DIAGNOSIS — Z95828 Presence of other vascular implants and grafts: Secondary | ICD-10-CM

## 2020-06-05 LAB — CMP (CANCER CENTER ONLY)
ALT: 29 U/L (ref 0–44)
AST: 27 U/L (ref 15–41)
Albumin: 3.3 g/dL — ABNORMAL LOW (ref 3.5–5.0)
Alkaline Phosphatase: 47 U/L (ref 38–126)
Anion gap: 9 (ref 5–15)
BUN: 16 mg/dL (ref 8–23)
CO2: 24 mmol/L (ref 22–32)
Calcium: 8.8 mg/dL — ABNORMAL LOW (ref 8.9–10.3)
Chloride: 106 mmol/L (ref 98–111)
Creatinine: 1.13 mg/dL (ref 0.61–1.24)
GFR, Estimated: >60 mL/min (ref >60)
Glucose, Bld: 175 mg/dL — ABNORMAL HIGH (ref 70–99)
Potassium: 3.7 mmol/L (ref 3.5–5.1)
Sodium: 139 mmol/L (ref 135–145)
Total Bilirubin: 0.3 mg/dL (ref 0.3–1.2)
Total Protein: 6.8 g/dL (ref 6.5–8.1)

## 2020-06-05 LAB — CBC WITH DIFFERENTIAL (CANCER CENTER ONLY)
Abs Immature Granulocytes: 0.05 10*3/uL (ref 0.00–0.07)
Basophils Absolute: 0 10*3/uL (ref 0.0–0.1)
Basophils Relative: 0 %
Eosinophils Absolute: 0.2 10*3/uL (ref 0.0–0.5)
Eosinophils Relative: 4 %
HCT: 33.7 % — ABNORMAL LOW (ref 39.0–52.0)
Hemoglobin: 11.3 g/dL — ABNORMAL LOW (ref 13.0–17.0)
Immature Granulocytes: 1 %
Lymphocytes Relative: 35 %
Lymphs Abs: 2.1 10*3/uL (ref 0.7–4.0)
MCH: 28.1 pg (ref 26.0–34.0)
MCHC: 33.5 g/dL (ref 30.0–36.0)
MCV: 83.8 fL (ref 80.0–100.0)
Monocytes Absolute: 0.5 10*3/uL (ref 0.1–1.0)
Monocytes Relative: 9 %
Neutro Abs: 3 10*3/uL (ref 1.7–7.7)
Neutrophils Relative %: 51 %
Platelet Count: 176 10*3/uL (ref 150–400)
RBC: 4.02 MIL/uL — ABNORMAL LOW (ref 4.22–5.81)
RDW: 16.6 % — ABNORMAL HIGH (ref 11.5–15.5)
WBC Count: 5.9 10*3/uL (ref 4.0–10.5)
nRBC: 0 % (ref 0.0–0.2)

## 2020-06-05 MED ORDER — SODIUM CHLORIDE 0.9 % IV SOLN
Freq: Once | INTRAVENOUS | Status: AC
  Filled 2020-06-05: qty 250

## 2020-06-05 MED ORDER — SODIUM CHLORIDE 0.9% FLUSH
10.0000 mL | Freq: Once | INTRAVENOUS | Status: AC
  Administered 2020-06-05: 10 mL
  Filled 2020-06-05: qty 10

## 2020-06-05 MED ORDER — CYANOCOBALAMIN 1000 MCG/ML IJ SOLN
1000.0000 ug | Freq: Once | INTRAMUSCULAR | Status: AC
  Administered 2020-06-05: 1000 ug via INTRAMUSCULAR

## 2020-06-05 MED ORDER — SODIUM CHLORIDE 0.9 % IV SOLN
10.0000 mg | Freq: Once | INTRAVENOUS | Status: AC
  Administered 2020-06-05: 10 mg via INTRAVENOUS
  Filled 2020-06-05: qty 10

## 2020-06-05 MED ORDER — SODIUM CHLORIDE 0.9 % IV SOLN
439.5000 mg | Freq: Once | INTRAVENOUS | Status: AC
  Administered 2020-06-05: 440 mg via INTRAVENOUS
  Filled 2020-06-05: qty 44

## 2020-06-05 MED ORDER — SODIUM CHLORIDE 0.9 % IV SOLN
200.0000 mg | Freq: Once | INTRAVENOUS | Status: AC
  Administered 2020-06-05: 200 mg via INTRAVENOUS
  Filled 2020-06-05: qty 8

## 2020-06-05 MED ORDER — SODIUM CHLORIDE 0.9 % IV SOLN
16.0000 mg | Freq: Once | INTRAVENOUS | Status: AC
  Administered 2020-06-05: 16 mg via INTRAVENOUS
  Filled 2020-06-05: qty 8

## 2020-06-05 MED ORDER — SODIUM CHLORIDE 0.9% FLUSH
10.0000 mL | INTRAVENOUS | Status: DC | PRN
  Administered 2020-06-05: 10 mL
  Filled 2020-06-05: qty 10

## 2020-06-05 MED ORDER — HEPARIN SOD (PORK) LOCK FLUSH 100 UNIT/ML IV SOLN
500.0000 [IU] | Freq: Once | INTRAVENOUS | Status: AC | PRN
  Administered 2020-06-05: 500 [IU]
  Filled 2020-06-05: qty 5

## 2020-06-05 MED ORDER — SODIUM CHLORIDE 0.9 % IV SOLN
500.0000 mg/m2 | Freq: Once | INTRAVENOUS | Status: AC
  Administered 2020-06-05: 900 mg via INTRAVENOUS
  Filled 2020-06-05: qty 20

## 2020-06-05 MED ORDER — CYANOCOBALAMIN 1000 MCG/ML IJ SOLN
INTRAMUSCULAR | Status: AC
  Filled 2020-06-05: qty 1

## 2020-06-05 MED ORDER — SODIUM CHLORIDE 0.9 % IV SOLN
150.0000 mg | Freq: Once | INTRAVENOUS | Status: AC
  Administered 2020-06-05: 150 mg via INTRAVENOUS
  Filled 2020-06-05: qty 150

## 2020-06-05 NOTE — Progress Notes (Signed)
[facility] Telephone:([phone]   Fax:(336) [phone]  OFFICE PROGRESS NOTE  [name], MD [place] [address]  DIAGNOSIS: Stage IV (T3, N2, M1c) non-small cell lung cancer favoring adenocarcinoma presented with right upper lobe lung mass in addition to right lower lobe, left lower lobe in addition to right hilar and mediastinal lymphadenopathy in addition to metastatic brain lesions diagnosed in October 2021.  Molecular studies by [employer]:  [id number], 9.5%,   PRIOR THERAPY: SRS to 3 brain lesion under the care of Dr. Lisbeth Renshaw.  Scheduled for April 20, 2020  CURRENT THERAPY: Systemic chemotherapy with carboplatin for AUC of 5, Alimta 500 mg/M2 and Keytruda 200 mg IV every 3 weeks.  First dose April 24, 2020.  Status post 2 cycles.  INTERVAL HISTORY: Nicolas Allen 64 y.o. male returns to the clinic today for follow-up visit accompanied by his wife. The patient is feeling fine today with no concerning complaints. He denied having any chest pain, shortness of breath, cough or hemoptysis. He denied having any nausea, vomiting, diarrhea or constipation. He has no headache or visual changes. He denied having any significant weight loss or night sweats. He continues to tolerate his systemic chemotherapy fairly well. He is here today for evaluation before starting cycle #3.  MEDICAL HISTORY: Past Medical History:  Diagnosis Date  . Anemia 06/24/2012  . ANXIETY 02/03/2007  . Cervical disc disease 02/12/2012   S/p surgury jan 2013  . Erectile dysfunction 02/11/2011  . GERD (gastroesophageal reflux disease)   . GLUCOSE INTOLERANCE 01/04/2008  . HYPERLIPIDEMIA 02/03/2007  . HYPERTENSION 02/03/2007  . IBS (irritable bowel syndrome)   . Impaired glucose tolerance 02/11/2011  . Smoker 08/22/2014  . Substance abuse ([place]) 2001   sober for 16 yrs    ALLERGIES:  has No Known Allergies.  MEDICATIONS:  Current Outpatient Medications  Medication  Sig Dispense Refill  . amLODipine-benazepril (LOTREL) 10-20 MG capsule TAKE 1 CAPSULE BY MOUTH ONCE DAILY . APPOINTMENT REQUIRED FOR FUTURE REFILLS 30 capsule 0  . aspirin EC 81 MG tablet Take 81 mg by mouth daily. Swallow whole.    . b complex vitamins tablet Take 1 tablet by mouth daily.    Marland Kitchen dexamethasone (DECADRON) 4 MG tablet Take 1 tablet (4 mg total) by mouth 3 (three) times daily. 90 tablet 0  . folic acid (FOLVITE) 1 MG tablet Take 1 tablet (1 mg total) by mouth daily. 30 tablet 4  . Garlic 6195 MG CAPS Take 1,000 mg by mouth daily.     Marland Kitchen ibuprofen (ADVIL) 200 MG tablet Take 200 mg by mouth every 6 (six) hours as needed.    . lidocaine-prilocaine (EMLA) cream Apply to the Port-A-Cath site 30-60 minutes before chemotherapy. 30 g 0  . Omega-3 Fatty Acids (FISH OIL PO) Take 1 capsule by mouth daily.     . pravastatin (PRAVACHOL) 40 MG tablet TAKE 1 TABLET BY MOUTH ONCE DAILY . APPOINTMENT REQUIRED FOR FUTURE REFILLS 30 tablet 0  . prochlorperazine (COMPAZINE) 10 MG tablet Take 1 tablet (10 mg total) by mouth every 6 (six) hours as needed for nausea or vomiting. 30 tablet 0  . vardenafil (LEVITRA) 20 MG tablet TAKE 1 TABLET BY MOUTH AS NEEDED FOR  ERECTILE  DYSFUNCTION 10 tablet 5  . Vitamin D, Ergocalciferol, (DRISDOL) 1.25 MG (50000 UNIT) CAPS capsule Take 1 capsule (50,000 Units total) by mouth every 7 (seven) days. (Patient taking differently: Take 50,000  Units by mouth every Sunday. ) 12 capsule 0   No current facility-administered medications for this visit.    SURGICAL HISTORY:  Past Surgical History:  Procedure Laterality Date  . BRONCHIAL BIOPSY  03/30/2020   Procedure: BRONCHIAL BIOPSIES;  Surgeon: Garner Nash, DO;  Location: [facility];  Service: Pulmonary;;  . BRONCHIAL BRUSHINGS  03/30/2020   Procedure: BRONCHIAL BRUSHINGS;  Surgeon: Garner Nash, DO;  Location: [facility];  Service: Pulmonary;;  . BRONCHIAL NEEDLE ASPIRATION BIOPSY  03/30/2020   Procedure:  BRONCHIAL NEEDLE ASPIRATION BIOPSIES;  Surgeon: Garner Nash, DO;  Location: [facility];  Service: Pulmonary;;  . BRONCHIAL WASHINGS  03/30/2020   Procedure: BRONCHIAL WASHINGS;  Surgeon: Garner Nash, DO;  Location: [facility];  Service: Pulmonary;;  . COLONOSCOPY  2007  . IR IMAGING GUIDED PORT INSERTION  04/23/2020  . neck fusion  2012   C4  . NO PAST SURGERIES    . VIDEO BRONCHOSCOPY WITH ENDOBRONCHIAL NAVIGATION N/A 03/30/2020   Procedure: VIDEO BRONCHOSCOPY WITH ENDOBRONCHIAL NAVIGATION;  Surgeon: Garner Nash, DO;  Location: [facility];  Service: Pulmonary;  Laterality: N/A;  . VIDEO BRONCHOSCOPY WITH ENDOBRONCHIAL ULTRASOUND N/A 03/30/2020   Procedure: VIDEO BRONCHOSCOPY WITH ENDOBRONCHIAL ULTRASOUND;  Surgeon: Garner Nash, DO;  Location: [facility];  Service: Pulmonary;  Laterality: N/A;    REVIEW OF SYSTEMS:  A comprehensive review of systems was negative.   PHYSICAL EXAMINATION: General appearance: alert, cooperative and no distress Head: Normocephalic, without obvious abnormality, atraumatic Neck: no adenopathy, no JVD, supple, symmetrical, trachea midline and thyroid not enlarged, symmetric, no tenderness/mass/nodules Lymph nodes: Cervical, supraclavicular, and axillary nodes normal. Resp: clear to auscultation bilaterally Back: symmetric, no curvature. ROM normal. No CVA tenderness. Cardio: regular rate and rhythm, S1, S2 normal, no murmur, click, rub or gallop GI: soft, non-tender; bowel sounds normal; no masses,  no organomegaly Extremities: extremities normal, atraumatic, no cyanosis or edema  ECOG PERFORMANCE STATUS: 1 - Symptomatic but completely ambulatory  Blood pressure 121/63, pulse 76, temperature 97.8 F (36.6 C), temperature source Tympanic, resp. rate 18, height 6' (1.829 m), weight 159 lb 1.6 oz (72.2 kg), SpO2 100 %.  LABORATORY DATA: Lab Results  Component Value Date   WBC 5.9 06/05/2020   HGB 11.3 (L) 06/05/2020   HCT 33.7 (L)  06/05/2020   MCV 83.8 06/05/2020   PLT 176 06/05/2020      Chemistry      Component Value Date/Time   NA 139 06/05/2020 0831   K 3.7 06/05/2020 0831   CL 106 06/05/2020 0831   CO2 24 06/05/2020 0831   BUN 16 06/05/2020 0831   CREATININE 1.13 06/05/2020 0831   CREATININE 0.88 02/21/2020 1115      Component Value Date/Time   CALCIUM 8.8 (L) 06/05/2020 0831   ALKPHOS 47 06/05/2020 0831   AST 27 06/05/2020 0831   ALT 29 06/05/2020 0831   BILITOT 0.3 06/05/2020 0831       RADIOGRAPHIC STUDIES: No results found.  ASSESSMENT AND PLAN: This is a very pleasant 64 years old African-American male recently diagnosed with a stage IV (T3, N2, M1c)  non-small cell lung cancer, adenocarcinoma presented with right upper lobe lung mass in addition to right lower lobe, left lower lobe in addition to right hilar and mediastinal lymphadenopathy in addition to metastatic brain lesions diagnosed in October 2021. The patient has molecular studies by [employer] that showed no actionable mutations. He underwent SRS treatment to his brain lesion. The patient  is currently undergoing systemic chemotherapy with carboplatin for AUC of 5, Alimta 500 mg/M2 and Keytruda 200 mg IV every 3 weeks status post 2 cycles.  The patient continues to tolerate this treatment well with no concerning adverse effects. I recommended for him to proceed with cycle #3 today as planned. I will see him back for follow-up visit in 3 weeks for evaluation before starting cycle #4 with repeat CT scan of the chest, abdomen and pelvis for restaging of his disease. The patient was advised to call immediately if he has any concerning symptoms in the interval. The patient voices understanding of current disease status and treatment options and is in agreement with the current care plan.  All questions were answered. The patient knows to call the clinic with any problems, questions or concerns. We can certainly see the patient much sooner  if necessary.  Disclaimer: This note was dictated with voice recognition software. Similar sounding words can inadvertently be transcribed and may not be corrected upon review.

## 2020-06-05 NOTE — Patient Instructions (Signed)
[facility] Discharge Instructions for Patients Receiving Chemotherapy  Today you received the following chemotherapy agents Pembrolizumab (KEYTRUDA), Pemetrexed (ALIMTA) & Carboplatin (PARAPLATIN).  To help prevent nausea and vomiting after your treatment, we encourage you to take your nausea medication as prescribed.   If you develop nausea and vomiting that is not controlled by your nausea medication, call the clinic.   BELOW ARE SYMPTOMS THAT SHOULD BE REPORTED IMMEDIATELY:  *FEVER GREATER THAN 100.5 F  *CHILLS WITH OR WITHOUT FEVER  NAUSEA AND VOMITING THAT IS NOT CONTROLLED WITH YOUR NAUSEA MEDICATION  *UNUSUAL SHORTNESS OF BREATH  *UNUSUAL BRUISING OR BLEEDING  TENDERNESS IN MOUTH AND THROAT WITH OR WITHOUT PRESENCE OF ULCERS  *URINARY PROBLEMS  *BOWEL PROBLEMS  UNUSUAL RASH Items with * indicate a potential emergency and should be followed up as soon as possible.  Feel free to call the clinic should you have any questions or concerns. The clinic phone number is [phone].  Please show the [facility] at check-in to the Emergency Department and triage nurse.

## 2020-06-07 ENCOUNTER — Other Ambulatory Visit: Payer: Self-pay | Admitting: Internal Medicine

## 2020-06-07 NOTE — Telephone Encounter (Signed)
Please refill as per office routine med refill policy (all routine meds refilled for 3 mo or monthly per pt preference up to one year from last visit, then month to month grace period for 3 mo, then further med refills will have to be denied)  

## 2020-06-11 ENCOUNTER — Telehealth: Payer: Self-pay | Admitting: Internal Medicine

## 2020-06-11 NOTE — Telephone Encounter (Signed)
Patient called and is requesting a med refill foramLODipine-benazepril (LOTREL) 10-20 MG capsule  pravastatin (PRAVACHOL) 40 MG tablet He was wondering if a short supply could be sent in until he can see Dr. Jenny Reichmann on 06/20/20. He said that he has one pill left and was also requesting a call back since he will be running out of his medication he wants to know what he should do. [phone] is the best number for him to be reached at.

## 2020-06-11 NOTE — Telephone Encounter (Signed)
Scheduled apt per 12/21 los - pt to get an updated schedule next visit.

## 2020-06-12 ENCOUNTER — Other Ambulatory Visit: Payer: Self-pay

## 2020-06-12 ENCOUNTER — Inpatient Hospital Stay: Payer: 59

## 2020-06-12 DIAGNOSIS — Z95828 Presence of other vascular implants and grafts: Secondary | ICD-10-CM

## 2020-06-12 DIAGNOSIS — C3491 Malignant neoplasm of unspecified part of right bronchus or lung: Secondary | ICD-10-CM

## 2020-06-12 DIAGNOSIS — Z5112 Encounter for antineoplastic immunotherapy: Secondary | ICD-10-CM | POA: Diagnosis not present

## 2020-06-12 LAB — CBC WITH DIFFERENTIAL (CANCER CENTER ONLY)
Abs Immature Granulocytes: 0.01 10*3/uL (ref 0.00–0.07)
Basophils Absolute: 0 10*3/uL (ref 0.0–0.1)
Basophils Relative: 1 %
Eosinophils Absolute: 0.2 10*3/uL (ref 0.0–0.5)
Eosinophils Relative: 6 %
HCT: 34.5 % — ABNORMAL LOW (ref 39.0–52.0)
Hemoglobin: 11.8 g/dL — ABNORMAL LOW (ref 13.0–17.0)
Immature Granulocytes: 0 %
Lymphocytes Relative: 48 %
Lymphs Abs: 1.6 10*3/uL (ref 0.7–4.0)
MCH: 28.2 pg (ref 26.0–34.0)
MCHC: 34.2 g/dL (ref 30.0–36.0)
MCV: 82.3 fL (ref 80.0–100.0)
Monocytes Absolute: 0.4 10*3/uL (ref 0.1–1.0)
Monocytes Relative: 11 %
Neutro Abs: 1.1 10*3/uL — ABNORMAL LOW (ref 1.7–7.7)
Neutrophils Relative %: 34 %
Platelet Count: 144 10*3/uL — ABNORMAL LOW (ref 150–400)
RBC: 4.19 MIL/uL — ABNORMAL LOW (ref 4.22–5.81)
RDW: 15.7 % — ABNORMAL HIGH (ref 11.5–15.5)
WBC Count: 3.2 10*3/uL — ABNORMAL LOW (ref 4.0–10.5)
nRBC: 0 % (ref 0.0–0.2)

## 2020-06-12 LAB — CMP (CANCER CENTER ONLY)
ALT: 33 U/L (ref 0–44)
AST: 27 U/L (ref 15–41)
Albumin: 3.7 g/dL (ref 3.5–5.0)
Alkaline Phosphatase: 45 U/L (ref 38–126)
Anion gap: 6 (ref 5–15)
BUN: 18 mg/dL (ref 8–23)
CO2: 27 mmol/L (ref 22–32)
Calcium: 9.6 mg/dL (ref 8.9–10.3)
Chloride: 103 mmol/L (ref 98–111)
Creatinine: 0.93 mg/dL (ref 0.61–1.24)
GFR, Estimated: >60 mL/min (ref >60)
Glucose, Bld: 90 mg/dL (ref 70–99)
Potassium: 4.1 mmol/L (ref 3.5–5.1)
Sodium: 136 mmol/L (ref 135–145)
Total Bilirubin: 0.6 mg/dL (ref 0.3–1.2)
Total Protein: 7.3 g/dL (ref 6.5–8.1)

## 2020-06-12 MED ORDER — HEPARIN SOD (PORK) LOCK FLUSH 100 UNIT/ML IV SOLN
500.0000 [IU] | Freq: Once | INTRAVENOUS | Status: AC
  Administered 2020-06-12: 500 [IU]
  Filled 2020-06-12: qty 5

## 2020-06-12 MED ORDER — SODIUM CHLORIDE 0.9% FLUSH
10.0000 mL | Freq: Once | INTRAVENOUS | Status: AC
  Administered 2020-06-12: 10 mL
  Filled 2020-06-12: qty 10

## 2020-06-12 NOTE — Patient Instructions (Signed)

## 2020-06-14 ENCOUNTER — Other Ambulatory Visit: Payer: Self-pay | Admitting: *Deleted

## 2020-06-14 DIAGNOSIS — C7931 Secondary malignant neoplasm of brain: Secondary | ICD-10-CM

## 2020-06-19 ENCOUNTER — Other Ambulatory Visit: Payer: Self-pay

## 2020-06-19 ENCOUNTER — Inpatient Hospital Stay: Payer: 59

## 2020-06-19 ENCOUNTER — Inpatient Hospital Stay: Payer: 59 | Attending: Internal Medicine

## 2020-06-19 DIAGNOSIS — Z95828 Presence of other vascular implants and grafts: Secondary | ICD-10-CM

## 2020-06-19 DIAGNOSIS — Z87891 Personal history of nicotine dependence: Secondary | ICD-10-CM | POA: Insufficient documentation

## 2020-06-19 DIAGNOSIS — Z5111 Encounter for antineoplastic chemotherapy: Secondary | ICD-10-CM | POA: Insufficient documentation

## 2020-06-19 DIAGNOSIS — C3411 Malignant neoplasm of upper lobe, right bronchus or lung: Secondary | ICD-10-CM | POA: Diagnosis present

## 2020-06-19 DIAGNOSIS — Z5112 Encounter for antineoplastic immunotherapy: Secondary | ICD-10-CM | POA: Diagnosis not present

## 2020-06-19 DIAGNOSIS — C3431 Malignant neoplasm of lower lobe, right bronchus or lung: Secondary | ICD-10-CM | POA: Insufficient documentation

## 2020-06-19 DIAGNOSIS — C3432 Malignant neoplasm of lower lobe, left bronchus or lung: Secondary | ICD-10-CM | POA: Insufficient documentation

## 2020-06-19 DIAGNOSIS — Z79899 Other long term (current) drug therapy: Secondary | ICD-10-CM | POA: Insufficient documentation

## 2020-06-19 DIAGNOSIS — C3491 Malignant neoplasm of unspecified part of right bronchus or lung: Secondary | ICD-10-CM

## 2020-06-19 LAB — CMP (CANCER CENTER ONLY)
ALT: 28 U/L (ref 0–44)
AST: 29 U/L (ref 15–41)
Albumin: 3.4 g/dL — ABNORMAL LOW (ref 3.5–5.0)
Alkaline Phosphatase: 45 U/L (ref 38–126)
Anion gap: 6 (ref 5–15)
BUN: 12 mg/dL (ref 8–23)
CO2: 25 mmol/L (ref 22–32)
Calcium: 8.6 mg/dL — ABNORMAL LOW (ref 8.9–10.3)
Chloride: 106 mmol/L (ref 98–111)
Creatinine: 0.94 mg/dL (ref 0.61–1.24)
GFR, Estimated: >60 mL/min (ref >60)
Glucose, Bld: 90 mg/dL (ref 70–99)
Potassium: 4.3 mmol/L (ref 3.5–5.1)
Sodium: 137 mmol/L (ref 135–145)
Total Bilirubin: 0.4 mg/dL (ref 0.3–1.2)
Total Protein: 6.9 g/dL (ref 6.5–8.1)

## 2020-06-19 LAB — CBC WITH DIFFERENTIAL (CANCER CENTER ONLY)
Abs Immature Granulocytes: 0.04 10*3/uL (ref 0.00–0.07)
Basophils Absolute: 0 10*3/uL (ref 0.0–0.1)
Basophils Relative: 0 %
Eosinophils Absolute: 0.1 10*3/uL (ref 0.0–0.5)
Eosinophils Relative: 2 %
HCT: 30.7 % — ABNORMAL LOW (ref 39.0–52.0)
Hemoglobin: 10.7 g/dL — ABNORMAL LOW (ref 13.0–17.0)
Immature Granulocytes: 1 %
Lymphocytes Relative: 34 %
Lymphs Abs: 1.8 10*3/uL (ref 0.7–4.0)
MCH: 28.8 pg (ref 26.0–34.0)
MCHC: 34.9 g/dL (ref 30.0–36.0)
MCV: 82.7 fL (ref 80.0–100.0)
Monocytes Absolute: 0.9 10*3/uL (ref 0.1–1.0)
Monocytes Relative: 17 %
Neutro Abs: 2.4 10*3/uL (ref 1.7–7.7)
Neutrophils Relative %: 46 %
Platelet Count: 125 10*3/uL — ABNORMAL LOW (ref 150–400)
RBC: 3.71 MIL/uL — ABNORMAL LOW (ref 4.22–5.81)
RDW: 16.9 % — ABNORMAL HIGH (ref 11.5–15.5)
WBC Count: 5.3 10*3/uL (ref 4.0–10.5)
nRBC: 0 % (ref 0.0–0.2)

## 2020-06-19 LAB — TSH: TSH: 0.94 u[IU]/mL (ref 0.320–4.118)

## 2020-06-19 MED ORDER — HEPARIN SOD (PORK) LOCK FLUSH 100 UNIT/ML IV SOLN
500.0000 [IU] | Freq: Once | INTRAVENOUS | Status: AC
  Administered 2020-06-19: 500 [IU]
  Filled 2020-06-19: qty 5

## 2020-06-19 MED ORDER — SODIUM CHLORIDE 0.9% FLUSH
10.0000 mL | Freq: Once | INTRAVENOUS | Status: AC
  Administered 2020-06-19: 10 mL
  Filled 2020-06-19: qty 10

## 2020-06-20 ENCOUNTER — Encounter: Payer: Self-pay | Admitting: Internal Medicine

## 2020-06-20 ENCOUNTER — Ambulatory Visit: Payer: 59 | Admitting: Internal Medicine

## 2020-06-20 VITALS — BP 130/80 | HR 95 | Temp 98.1°F | Ht 72.0 in | Wt 160.0 lb

## 2020-06-20 DIAGNOSIS — E559 Vitamin D deficiency, unspecified: Secondary | ICD-10-CM

## 2020-06-20 DIAGNOSIS — R7302 Impaired glucose tolerance (oral): Secondary | ICD-10-CM | POA: Diagnosis not present

## 2020-06-20 DIAGNOSIS — Z87891 Personal history of nicotine dependence: Secondary | ICD-10-CM

## 2020-06-20 DIAGNOSIS — E7849 Other hyperlipidemia: Secondary | ICD-10-CM | POA: Diagnosis not present

## 2020-06-20 DIAGNOSIS — Z0000 Encounter for general adult medical examination without abnormal findings: Secondary | ICD-10-CM

## 2020-06-20 DIAGNOSIS — I10 Essential (primary) hypertension: Secondary | ICD-10-CM | POA: Diagnosis not present

## 2020-06-20 DIAGNOSIS — C3491 Malignant neoplasm of unspecified part of right bronchus or lung: Secondary | ICD-10-CM

## 2020-06-20 DIAGNOSIS — E538 Deficiency of other specified B group vitamins: Secondary | ICD-10-CM

## 2020-06-20 MED ORDER — AMLODIPINE BESY-BENAZEPRIL HCL 10-20 MG PO CAPS: ORAL_CAPSULE | ORAL | 3 refills | Status: DC

## 2020-06-20 MED ORDER — PRAVASTATIN SODIUM 40 MG PO TABS: ORAL_TABLET | ORAL | 3 refills | Status: DC

## 2020-06-20 NOTE — Patient Instructions (Signed)
Please continue all other medications as before, and refills have been done if requested.  Please have the pharmacy call with any other refills you may need.  Please continue your efforts at being more active, low cholesterol diet, and weight control.  Please keep your appointments with your specialists as you may have planned  Please make an Appointment to return in Mar 2021 with labs done at the [facility] lab before

## 2020-06-20 NOTE — Assessment & Plan Note (Signed)
Cont f/u oncology as planned

## 2020-06-20 NOTE — Assessment & Plan Note (Signed)
Lab Results  Component Value Date   LDLCALC 97 02/21/2020  stable, to contine pravastatin, low chol diet, goal ldl < 100

## 2020-06-20 NOTE — Assessment & Plan Note (Signed)
Lab Results  Component Value Date   HGBA1C 4.9 02/21/2020  stable, does not require medicatoin, cont diet and wt control

## 2020-06-20 NOTE — Assessment & Plan Note (Signed)
BP Readings from Last 3 Encounters:  06/20/20 130/80  06/05/20 121/63  05/15/20 110/63  stable, to continue lotrel

## 2020-06-20 NOTE — Assessment & Plan Note (Signed)
To continue off tobacco

## 2020-06-20 NOTE — Progress Notes (Signed)
Established Patient Office Visit  Subjective:  Patient ID: Nicolas Allen, male    DOB: 13-Apr-1956  Age: 65 y.o. MRN: [id number]      Chief Complaint: to follow up HTN, HLD and hyperglycemia, now former smoker       HPI:  Nicolas Allen is a 65 y.o. male here to f/u overall doing well, tolerating lotrel well without cough, swelling or rash, trying to follow lower chol diet and good med compliance with statin, denies polydipsia polyuria.  Has gained several lbs recently despite ongoing tx for stage 4 small cell lung ca.  Was able to stop smoking.  No new complaints.   Pt denies chest pain, increasing sob or doe, wheezing, orthopnea, PND, increased LE swelling, palpitations, dizziness or syncope.  Pt denies new neurological symptoms such as new headache, or facial or extremity weakness or numbness..        Wt Readings from Last 3 Encounters:  06/20/20 160 lb (72.6 kg)  06/05/20 159 lb 1.6 oz (72.2 kg)  05/15/20 155 lb 6.4 oz (70.5 kg)   BP Readings from Last 3 Encounters:  06/20/20 130/80  06/05/20 121/63  05/15/20 110/63         Past Medical History:  Diagnosis Date  . Anemia 06/24/2012  . ANXIETY 02/03/2007  . Cervical disc disease 02/12/2012   S/p surgury jan 2013  . Erectile dysfunction 02/11/2011  . GERD (gastroesophageal reflux disease)   . GLUCOSE INTOLERANCE 01/04/2008  . HYPERLIPIDEMIA 02/03/2007  . HYPERTENSION 02/03/2007  . IBS (irritable bowel syndrome)   . Impaired glucose tolerance 02/11/2011  . Smoker 08/22/2014  . Substance abuse ([place]) 2001   sober for 16 yrs   Past Surgical History:  Procedure Laterality Date  . BRONCHIAL BIOPSY  03/30/2020   Procedure: BRONCHIAL BIOPSIES;  Surgeon: Garner Nash, DO;  Location: [facility];  Service: Pulmonary;;  . BRONCHIAL BRUSHINGS  03/30/2020   Procedure: BRONCHIAL BRUSHINGS;  Surgeon: Garner Nash, DO;  Location: [facility];  Service: Pulmonary;;  . BRONCHIAL NEEDLE ASPIRATION BIOPSY  03/30/2020   Procedure:  BRONCHIAL NEEDLE ASPIRATION BIOPSIES;  Surgeon: Garner Nash, DO;  Location: [facility];  Service: Pulmonary;;  . BRONCHIAL WASHINGS  03/30/2020   Procedure: BRONCHIAL WASHINGS;  Surgeon: Garner Nash, DO;  Location: [facility];  Service: Pulmonary;;  . COLONOSCOPY  2007  . IR IMAGING GUIDED PORT INSERTION  04/23/2020  . neck fusion  2012   C4  . NO PAST SURGERIES    . VIDEO BRONCHOSCOPY WITH ENDOBRONCHIAL NAVIGATION N/A 03/30/2020   Procedure: VIDEO BRONCHOSCOPY WITH ENDOBRONCHIAL NAVIGATION;  Surgeon: Garner Nash, DO;  Location: [facility];  Service: Pulmonary;  Laterality: N/A;  . VIDEO BRONCHOSCOPY WITH ENDOBRONCHIAL ULTRASOUND N/A 03/30/2020   Procedure: VIDEO BRONCHOSCOPY WITH ENDOBRONCHIAL ULTRASOUND;  Surgeon: Garner Nash, DO;  Location: [facility];  Service: Pulmonary;  Laterality: N/A;    reports that he quit smoking about 3 months ago. His smoking use included cigarettes. He has a 45.00 pack-year smoking history. He has quit using smokeless tobacco.  His smokeless tobacco use included chew. He reports that he does not drink alcohol and does not use drugs. family history includes Cancer in his cousin and mother; Hypertension in an other family member; Stroke in an other family member. No Known Allergies Current Outpatient Medications on File Prior to Visit  Medication Sig Dispense Refill  . aspirin EC 81 MG tablet Take 81 mg by mouth daily. Swallow whole.    Marland Kitchen  b complex vitamins tablet Take 1 tablet by mouth daily.    Marland Kitchen dexamethasone (DECADRON) 4 MG tablet Take 1 tablet (4 mg total) by mouth 3 (three) times daily. 90 tablet 0  . folic acid (FOLVITE) 1 MG tablet Take 1 tablet (1 mg total) by mouth daily. 30 tablet 4  . Garlic 3382 MG CAPS Take 1,000 mg by mouth daily.     Marland Kitchen ibuprofen (ADVIL) 200 MG tablet Take 200 mg by mouth every 6 (six) hours as needed.    . lidocaine-prilocaine (EMLA) cream Apply to the Port-A-Cath site 30-60 minutes before chemotherapy.  30 g 0  . Omega-3 Fatty Acids (FISH OIL PO) Take 1 capsule by mouth daily.     . prochlorperazine (COMPAZINE) 10 MG tablet Take 1 tablet (10 mg total) by mouth every 6 (six) hours as needed for nausea or vomiting. 30 tablet 0  . vardenafil (LEVITRA) 20 MG tablet TAKE 1 TABLET BY MOUTH AS NEEDED FOR  ERECTILE  DYSFUNCTION 10 tablet 5  . Vitamin D, Ergocalciferol, (DRISDOL) 1.25 MG (50000 UNIT) CAPS capsule Take 1 capsule (50,000 Units total) by mouth every 7 (seven) days. (Patient taking differently: Take 50,000 Units by mouth every Sunday.) 12 capsule 0   No current facility-administered medications on file prior to visit.        ROS:  All others reviewed and negative.  Objective        PE:  BP 130/80 (BP Location: Right Arm, Patient Position: Sitting, Cuff Size: Normal)   Pulse 95   Temp 98.1 F (36.7 C) (Oral)   Ht 6' (1.829 m)   Wt 160 lb (72.6 kg)   SpO2 99%   BMI 21.70 kg/m                 Constitutional: Pt appears in NAD               HENT: Head: NCAT.                Right Ear: External ear normal.                 Left Ear: External ear normal.                Eyes: . Pupils are equal, round, and reactive to light. Conjunctivae and EOM are normal               Nose: without d/c or deformity               Neck: Neck supple. Gross normal ROM               Cardiovascular: Normal rate and regular rhythm.                 Pulmonary/Chest: Effort normal and breath sounds without rales or wheezing.                Abd:  Soft, NT, ND, + BS, no organomegaly               Neurological: Pt is alert. At baseline orientation, motor grossly intact               Skin: Skin is warm. No rashes, no other new lesions, LE edema - none               Psychiatric: Pt behavior is normal without agitation   Assessment/Plan:  Nicolas Allen is a 65 y.o. Black or African American [2]  male with  has a past medical history of Anemia (06/24/2012), ANXIETY (02/03/2007), Cervical disc disease (02/12/2012),  Erectile dysfunction (02/11/2011), GERD (gastroesophageal reflux disease), GLUCOSE INTOLERANCE (01/04/2008), HYPERLIPIDEMIA (02/03/2007), HYPERTENSION (02/03/2007), IBS (irritable bowel syndrome), Impaired glucose tolerance (02/11/2011), Smoker (08/22/2014), and Substance abuse ([place]) (2001).   Assessment Plan  See problem oriented assessment and plan Labs reviewed for each problem: Lab Results  Component Value Date   WBC 5.3 06/19/2020   HGB 10.7 (L) 06/19/2020   HCT 30.7 (L) 06/19/2020   PLT 125 (L) 06/19/2020   GLUCOSE 90 06/19/2020   CHOL 162 02/21/2020   TRIG 46 02/21/2020   HDL 51 02/21/2020   LDLCALC 97 02/21/2020   ALT 28 06/19/2020   AST 29 06/19/2020   NA 137 06/19/2020   K 4.3 06/19/2020   CL 106 06/19/2020   CREATININE 0.94 06/19/2020   BUN 12 06/19/2020   CO2 25 06/19/2020   TSH 0.940 06/19/2020   PSA 1.1 02/21/2020   INR 1.0 03/30/2020   HGBA1C 4.9 02/21/2020    Micro: none  Cardiac tracings I have personally interpreted today:  none  Pertinent Radiological findings (summarize): none   There are no preventive care reminders to display for this patient.  Lab Results  Component Value Date   TSH 0.940 06/19/2020   Lab Results  Component Value Date   WBC 5.3 06/19/2020   HGB 10.7 (L) 06/19/2020   HCT 30.7 (L) 06/19/2020   MCV 82.7 06/19/2020   PLT 125 (L) 06/19/2020   Lab Results  Component Value Date   NA 137 06/19/2020   K 4.3 06/19/2020   CO2 25 06/19/2020   GLUCOSE 90 06/19/2020   BUN 12 06/19/2020   CREATININE 0.94 06/19/2020   BILITOT 0.4 06/19/2020   ALKPHOS 45 06/19/2020   AST 29 06/19/2020   ALT 28 06/19/2020   PROT 6.9 06/19/2020   ALBUMIN 3.4 (L) 06/19/2020   CALCIUM 8.6 (L) 06/19/2020   ANIONGAP 6 06/19/2020   GFR 113.59 02/22/2019   Lab Results  Component Value Date   CHOL 162 02/21/2020   Lab Results  Component Value Date   HDL 51 02/21/2020   Lab Results  Component Value Date   LDLCALC 97 02/21/2020   Lab Results   Component Value Date   TRIG 46 02/21/2020   Lab Results  Component Value Date   CHOLHDL 3.2 02/21/2020   Lab Results  Component Value Date   HGBA1C 4.9 02/21/2020      Assessment & Plan:   Problem List Items Addressed This Visit      High   Non-small cell carcinoma of right lung, stage 4 ([place])    Cont f/u oncology as planned        Medium   Vitamin D deficiency   Relevant Orders   VITAMIN D 25 Hydroxy (Vit-D Deficiency, Fractures)   Impaired glucose tolerance    Lab Results  Component Value Date   HGBA1C 4.9 02/21/2020  stable, does not require medicatoin, cont diet and wt control      Relevant Orders   Hemoglobin A1c   Hyperlipidemia    Lab Results  Component Value Date   LDLCALC 97 02/21/2020  stable, to contine pravastatin, low chol diet, goal ldl < 100      Relevant Medications   amLODipine-benazepril (LOTREL) 10-20 MG capsule   pravastatin (PRAVACHOL) 40 MG tablet   Former smoker    To continue off tobacco      Essential hypertension -  Primary    BP Readings from Last 3 Encounters:  06/20/20 130/80  06/05/20 121/63  05/15/20 110/63  stable, to continue lotrel      Relevant Medications   amLODipine-benazepril (LOTREL) 10-20 MG capsule   pravastatin (PRAVACHOL) 40 MG tablet   Other Relevant Orders   Lipid panel   Hepatic function panel   CBC with Differential/Platelet   TSH   Urinalysis, Routine w reflex microscopic   Basic metabolic panel   X45 deficiency   Relevant Orders   Vitamin B12    Other Visit Diagnoses    Preventative health care       Relevant Orders   PSA      Meds ordered this encounter  Medications  . amLODipine-benazepril (LOTREL) 10-20 MG capsule    Sig: TAKE 1 CAPSULE BY MOUTH ONCE DAILY    Dispense:  90 capsule    Refill:  3  . pravastatin (PRAVACHOL) 40 MG tablet    Sig: TAKE 1 TABLET BY MOUTH ONCE DAILY    Dispense:  90 tablet    Refill:  3    Follow-up: Return if symptoms worsen or fail to improve.     Cathlean Cower, MD 06/20/2020 3:56 PM [facility]

## 2020-06-25 ENCOUNTER — Other Ambulatory Visit: Payer: Self-pay

## 2020-06-25 ENCOUNTER — Encounter (HOSPITAL_COMMUNITY): Payer: Self-pay

## 2020-06-25 ENCOUNTER — Ambulatory Visit (HOSPITAL_COMMUNITY)
Admission: RE | Admit: 2020-06-25 | Discharge: 2020-06-25 | Disposition: A | Discharge status: Home / Self Care | Payer: 59 | Source: Ambulatory Visit | Attending: Internal Medicine | Admitting: Internal Medicine

## 2020-06-25 DIAGNOSIS — C3490 Malignant neoplasm of unspecified part of unspecified bronchus or lung: Secondary | ICD-10-CM | POA: Insufficient documentation

## 2020-06-25 HISTORY — DX: Malignant (primary) neoplasm, unspecified: C80.1

## 2020-06-25 MED ORDER — IOHEXOL 300 MG/ML  SOLN
100.0000 mL | Freq: Once | INTRAMUSCULAR | Status: AC | PRN
  Administered 2020-06-25: 100 mL via INTRAVENOUS

## 2020-06-25 MED ORDER — HEPARIN SOD (PORK) LOCK FLUSH 100 UNIT/ML IV SOLN
500.0000 [IU] | Freq: Once | INTRAVENOUS | Status: AC
  Administered 2020-06-25: 500 [IU]

## 2020-06-25 MED ORDER — HEPARIN SOD (PORK) LOCK FLUSH 100 UNIT/ML IV SOLN
INTRAVENOUS | Status: AC
  Filled 2020-06-25: qty 5

## 2020-06-26 ENCOUNTER — Inpatient Hospital Stay: Payer: 59

## 2020-06-26 ENCOUNTER — Encounter: Payer: Self-pay | Admitting: Internal Medicine

## 2020-06-26 ENCOUNTER — Inpatient Hospital Stay (HOSPITAL_BASED_OUTPATIENT_CLINIC_OR_DEPARTMENT_OTHER): Payer: 59 | Admitting: Internal Medicine

## 2020-06-26 ENCOUNTER — Other Ambulatory Visit: Payer: Self-pay

## 2020-06-26 VITALS — BP 131/68 | HR 82 | Temp 97.6°F | Resp 15 | Ht 72.0 in | Wt 160.5 lb

## 2020-06-26 DIAGNOSIS — C3491 Malignant neoplasm of unspecified part of right bronchus or lung: Secondary | ICD-10-CM | POA: Diagnosis not present

## 2020-06-26 DIAGNOSIS — Z5112 Encounter for antineoplastic immunotherapy: Secondary | ICD-10-CM | POA: Diagnosis not present

## 2020-06-26 DIAGNOSIS — Z5111 Encounter for antineoplastic chemotherapy: Secondary | ICD-10-CM

## 2020-06-26 DIAGNOSIS — Z95828 Presence of other vascular implants and grafts: Secondary | ICD-10-CM

## 2020-06-26 LAB — CBC WITH DIFFERENTIAL (CANCER CENTER ONLY)
Abs Immature Granulocytes: 0.02 10*3/uL (ref 0.00–0.07)
Basophils Absolute: 0 10*3/uL (ref 0.0–0.1)
Basophils Relative: 0 %
Eosinophils Absolute: 0.1 10*3/uL (ref 0.0–0.5)
Eosinophils Relative: 2 %
HCT: 32.7 % — ABNORMAL LOW (ref 39.0–52.0)
Hemoglobin: 11.1 g/dL — ABNORMAL LOW (ref 13.0–17.0)
Immature Granulocytes: 0 %
Lymphocytes Relative: 31 %
Lymphs Abs: 1.5 10*3/uL (ref 0.7–4.0)
MCH: 28.7 pg (ref 26.0–34.0)
MCHC: 33.9 g/dL (ref 30.0–36.0)
MCV: 84.5 fL (ref 80.0–100.0)
Monocytes Absolute: 0.6 10*3/uL (ref 0.1–1.0)
Monocytes Relative: 12 %
Neutro Abs: 2.7 10*3/uL (ref 1.7–7.7)
Neutrophils Relative %: 55 %
Platelet Count: 175 10*3/uL (ref 150–400)
RBC: 3.87 MIL/uL — ABNORMAL LOW (ref 4.22–5.81)
RDW: 17.4 % — ABNORMAL HIGH (ref 11.5–15.5)
WBC Count: 5 10*3/uL (ref 4.0–10.5)
nRBC: 0 % (ref 0.0–0.2)

## 2020-06-26 LAB — CMP (CANCER CENTER ONLY)
ALT: 29 U/L (ref 0–44)
AST: 27 U/L (ref 15–41)
Albumin: 3.6 g/dL (ref 3.5–5.0)
Alkaline Phosphatase: 46 U/L (ref 38–126)
Anion gap: 8 (ref 5–15)
BUN: 11 mg/dL (ref 8–23)
CO2: 27 mmol/L (ref 22–32)
Calcium: 9.2 mg/dL (ref 8.9–10.3)
Chloride: 104 mmol/L (ref 98–111)
Creatinine: 1.06 mg/dL (ref 0.61–1.24)
GFR, Estimated: >60 mL/min (ref >60)
Glucose, Bld: 133 mg/dL — ABNORMAL HIGH (ref 70–99)
Potassium: 3.9 mmol/L (ref 3.5–5.1)
Sodium: 139 mmol/L (ref 135–145)
Total Bilirubin: 0.4 mg/dL (ref 0.3–1.2)
Total Protein: 7.2 g/dL (ref 6.5–8.1)

## 2020-06-26 MED ORDER — SODIUM CHLORIDE 0.9 % IV SOLN
10.0000 mg | Freq: Once | INTRAVENOUS | Status: AC
  Administered 2020-06-26: 10 mg via INTRAVENOUS
  Filled 2020-06-26: qty 10

## 2020-06-26 MED ORDER — HEPARIN SOD (PORK) LOCK FLUSH 100 UNIT/ML IV SOLN
500.0000 [IU] | Freq: Once | INTRAVENOUS | Status: AC | PRN
  Administered 2020-06-26: 500 [IU]
  Filled 2020-06-26: qty 5

## 2020-06-26 MED ORDER — SODIUM CHLORIDE 0.9 % IV SOLN
500.0000 mg/m2 | Freq: Once | INTRAVENOUS | Status: AC
  Administered 2020-06-26: 900 mg via INTRAVENOUS
  Filled 2020-06-26: qty 20

## 2020-06-26 MED ORDER — SODIUM CHLORIDE 0.9 % IV SOLN
200.0000 mg | Freq: Once | INTRAVENOUS | Status: AC
  Administered 2020-06-26: 200 mg via INTRAVENOUS
  Filled 2020-06-26: qty 8

## 2020-06-26 MED ORDER — SODIUM CHLORIDE 0.9 % IV SOLN
Freq: Once | INTRAVENOUS | Status: AC
  Filled 2020-06-26: qty 250

## 2020-06-26 MED ORDER — SODIUM CHLORIDE 0.9 % IV SOLN
460.5000 mg | Freq: Once | INTRAVENOUS | Status: AC
  Administered 2020-06-26: 460 mg via INTRAVENOUS
  Filled 2020-06-26: qty 46

## 2020-06-26 MED ORDER — SODIUM CHLORIDE 0.9 % IV SOLN
150.0000 mg | Freq: Once | INTRAVENOUS | Status: AC
  Administered 2020-06-26: 150 mg via INTRAVENOUS
  Filled 2020-06-26: qty 150

## 2020-06-26 MED ORDER — SODIUM CHLORIDE 0.9 % IV SOLN
16.0000 mg | Freq: Once | INTRAVENOUS | Status: AC
  Administered 2020-06-26: 16 mg via INTRAVENOUS
  Filled 2020-06-26: qty 8

## 2020-06-26 MED ORDER — SODIUM CHLORIDE 0.9% FLUSH
10.0000 mL | INTRAVENOUS | Status: DC | PRN
  Administered 2020-06-26: 10 mL
  Filled 2020-06-26: qty 10

## 2020-06-26 MED ORDER — SODIUM CHLORIDE 0.9% FLUSH
10.0000 mL | Freq: Once | INTRAVENOUS | Status: AC
  Administered 2020-06-26: 10 mL
  Filled 2020-06-26: qty 10

## 2020-06-26 NOTE — Patient Instructions (Signed)
[facility] Discharge Instructions for Patients Receiving Chemotherapy  Today you received the following chemotherapy agents Pembrolizumab (KEYTRUDA), Pemetrexed (ALIMTA) & Carboplatin (PARAPLATIN).  To help prevent nausea and vomiting after your treatment, we encourage you to take your nausea medication as prescribed.   If you develop nausea and vomiting that is not controlled by your nausea medication, call the clinic.   BELOW ARE SYMPTOMS THAT SHOULD BE REPORTED IMMEDIATELY:  *FEVER GREATER THAN 100.5 F  *CHILLS WITH OR WITHOUT FEVER  NAUSEA AND VOMITING THAT IS NOT CONTROLLED WITH YOUR NAUSEA MEDICATION  *UNUSUAL SHORTNESS OF BREATH  *UNUSUAL BRUISING OR BLEEDING  TENDERNESS IN MOUTH AND THROAT WITH OR WITHOUT PRESENCE OF ULCERS  *URINARY PROBLEMS  *BOWEL PROBLEMS  UNUSUAL RASH Items with * indicate a potential emergency and should be followed up as soon as possible.  Feel free to call the clinic should you have any questions or concerns. The clinic phone number is [phone].  Please show the [facility] at check-in to the Emergency Department and triage nurse.

## 2020-06-26 NOTE — Progress Notes (Signed)
[place] [phone]   [phone]  OFFICE PROGRESS NOTE  [name], MD [place] [id number]  DIAGNOSIS: Stage IV (T3, N2, M1c) non-small cell lung cancer favoring adenocarcinoma presented with right upper lobe lung mass in addition to right lower lobe, left lower lobe in addition to right hilar and mediastinal lymphadenopathy in addition to metastatic brain lesions diagnosed in October 2021.  Molecular studies by [employer]:  [id number], 9.5%,   PRIOR THERAPY: SRS to 3 brain lesion under the care of Dr. Lisbeth Renshaw.  Scheduled for April 20, 2020  CURRENT THERAPY: Systemic chemotherapy with carboplatin for AUC of 5, Alimta 500 mg/M2 and Keytruda 200 mg IV every 3 weeks.  First dose April 24, 2020.  Status post 3 cycles.  INTERVAL HISTORY: Nicolas Allen 65 y.o. male returns to the clinic today for follow-up visit accompanied by his wife.  The patient is feeling fine today with no concerning complaints.  He denied having any chest pain, shortness of breath, cough or hemoptysis.  He denied having any fever or chills.  He has no nausea, vomiting, diarrhea or constipation.  He has no headache or visual changes.  He has no significant weight loss or night sweats.  He continues to tolerate his systemic chemotherapy with carboplatin, Alimta and Keytruda fairly well.  The patient had repeat CT scan of the chest, abdomen pelvis performed recently and he is here for evaluation and discussion of his scan results.  MEDICAL HISTORY: Past Medical History:  Diagnosis Date  . Anemia 06/24/2012  . ANXIETY 02/03/2007  . Cancer ([place])   . Cervical disc disease 02/12/2012   S/p surgury jan 2013  . Erectile dysfunction 02/11/2011  . GERD (gastroesophageal reflux disease)   . GLUCOSE INTOLERANCE 01/04/2008  . HYPERLIPIDEMIA 02/03/2007  . HYPERTENSION 02/03/2007  . IBS (irritable bowel syndrome)   . Impaired glucose tolerance 02/11/2011  .  Smoker 08/22/2014  . Substance abuse ([place]) 2001   sober for 16 yrs    ALLERGIES:  has No Known Allergies.  MEDICATIONS:  Current Outpatient Medications  Medication Sig Dispense Refill  . amLODipine-benazepril (LOTREL) 10-20 MG capsule TAKE 1 CAPSULE BY MOUTH ONCE DAILY 90 capsule 3  . aspirin EC 81 MG tablet Take 81 mg by mouth daily. Swallow whole.    . b complex vitamins tablet Take 1 tablet by mouth daily.    Marland Kitchen dexamethasone (DECADRON) 4 MG tablet Take 1 tablet (4 mg total) by mouth 3 (three) times daily. 90 tablet 0  . folic acid (FOLVITE) 1 MG tablet Take 1 tablet (1 mg total) by mouth daily. 30 tablet 4  . Garlic 4193 MG CAPS Take 1,000 mg by mouth daily.     Marland Kitchen ibuprofen (ADVIL) 200 MG tablet Take 200 mg by mouth every 6 (six) hours as needed.    . lidocaine-prilocaine (EMLA) cream Apply to the Port-A-Cath site 30-60 minutes before chemotherapy. 30 g 0  . Omega-3 Fatty Acids (FISH OIL PO) Take 1 capsule by mouth daily.     . pravastatin (PRAVACHOL) 40 MG tablet TAKE 1 TABLET BY MOUTH ONCE DAILY 90 tablet 3  . prochlorperazine (COMPAZINE) 10 MG tablet Take 1 tablet (10 mg total) by mouth every 6 (six) hours as needed for nausea or vomiting. 30 tablet 0  . vardenafil (LEVITRA) 20 MG tablet TAKE 1 TABLET BY MOUTH AS NEEDED FOR  ERECTILE  DYSFUNCTION 10 tablet 5  . Vitamin  D, Ergocalciferol, (DRISDOL) 1.25 MG (50000 UNIT) CAPS capsule Take 1 capsule (50,000 Units total) by mouth every 7 (seven) days. (Patient taking differently: Take 50,000 Units by mouth every Sunday.) 12 capsule 0   No current facility-administered medications for this visit.    SURGICAL HISTORY:  Past Surgical History:  Procedure Laterality Date  . BRONCHIAL BIOPSY  03/30/2020   Procedure: BRONCHIAL BIOPSIES;  Surgeon: Garner Nash, DO;  Location: [facility];  Service: Pulmonary;;  . BRONCHIAL BRUSHINGS  03/30/2020   Procedure: BRONCHIAL BRUSHINGS;  Surgeon: Garner Nash, DO;  Location: [facility];   Service: Pulmonary;;  . BRONCHIAL NEEDLE ASPIRATION BIOPSY  03/30/2020   Procedure: BRONCHIAL NEEDLE ASPIRATION BIOPSIES;  Surgeon: Garner Nash, DO;  Location: [facility];  Service: Pulmonary;;  . BRONCHIAL WASHINGS  03/30/2020   Procedure: BRONCHIAL WASHINGS;  Surgeon: Garner Nash, DO;  Location: [facility];  Service: Pulmonary;;  . COLONOSCOPY  2007  . IR IMAGING GUIDED PORT INSERTION  04/23/2020  . neck fusion  2012   C4  . NO PAST SURGERIES    . VIDEO BRONCHOSCOPY WITH ENDOBRONCHIAL NAVIGATION N/A 03/30/2020   Procedure: VIDEO BRONCHOSCOPY WITH ENDOBRONCHIAL NAVIGATION;  Surgeon: Garner Nash, DO;  Location: [facility];  Service: Pulmonary;  Laterality: N/A;  . VIDEO BRONCHOSCOPY WITH ENDOBRONCHIAL ULTRASOUND N/A 03/30/2020   Procedure: VIDEO BRONCHOSCOPY WITH ENDOBRONCHIAL ULTRASOUND;  Surgeon: Garner Nash, DO;  Location: [facility];  Service: Pulmonary;  Laterality: N/A;    REVIEW OF SYSTEMS:  Constitutional: positive for fatigue Eyes: negative Ears, nose, mouth, throat, and face: negative Respiratory: negative Cardiovascular: negative Gastrointestinal: negative Genitourinary:negative Integument/breast: negative Hematologic/lymphatic: negative Musculoskeletal:negative Neurological: negative Behavioral/Psych: negative Endocrine: negative Allergic/Immunologic: negative   PHYSICAL EXAMINATION: General appearance: alert, cooperative and no distress Head: Normocephalic, without obvious abnormality, atraumatic Neck: no adenopathy, no JVD, supple, symmetrical, trachea midline and thyroid not enlarged, symmetric, no tenderness/mass/nodules Lymph nodes: Cervical, supraclavicular, and axillary nodes normal. Resp: clear to auscultation bilaterally Back: symmetric, no curvature. ROM normal. No CVA tenderness. Cardio: regular rate and rhythm, S1, S2 normal, no murmur, click, rub or gallop GI: soft, non-tender; bowel sounds normal; no masses,  no  organomegaly Extremities: extremities normal, atraumatic, no cyanosis or edema Neurologic: Alert and oriented X 3, normal strength and tone. Normal symmetric reflexes. Normal coordination and gait  ECOG PERFORMANCE STATUS: 1 - Symptomatic but completely ambulatory  Blood pressure 131/68, pulse 82, temperature 97.6 F (36.4 C), temperature source Tympanic, resp. rate 15, height 6' (1.829 m), weight 160 lb 8 oz (72.8 kg), SpO2 100 %.  LABORATORY DATA: Lab Results  Component Value Date   WBC 5.0 06/26/2020   HGB 11.1 (L) 06/26/2020   HCT 32.7 (L) 06/26/2020   MCV 84.5 06/26/2020   PLT 175 06/26/2020      Chemistry      Component Value Date/Time   NA 139 06/26/2020 1138   K 3.9 06/26/2020 1138   CL 104 06/26/2020 1138   CO2 27 06/26/2020 1138   BUN 11 06/26/2020 1138   CREATININE 1.06 06/26/2020 1138   CREATININE 0.88 02/21/2020 1115      Component Value Date/Time   CALCIUM 9.2 06/26/2020 1138   ALKPHOS 46 06/26/2020 1138   AST 27 06/26/2020 1138   ALT 29 06/26/2020 1138   BILITOT 0.4 06/26/2020 1138       RADIOGRAPHIC STUDIES: CT Chest W Contrast  Result Date: 06/25/2020 CLINICAL DATA:  Non-small cell lung cancer staging EXAM: CT CHEST, ABDOMEN, AND PELVIS  WITH CONTRAST TECHNIQUE: Multidetector CT imaging of the chest, abdomen and pelvis was performed following the standard protocol during bolus administration of intravenous contrast. CONTRAST:  17mL OMNIPAQUE IOHEXOL 300 MG/ML SOLN, additional oral enteric contrast COMPARISON:  CT chest, 03/27/2020, PET-CT, 04/02/2020 FINDINGS: CT CHEST FINDINGS Cardiovascular: Right chest port catheter. Aortic atherosclerosis. Normal heart size. No pericardial effusion. Mediastinum/Nodes: Interval decrease in size of previously enlarged and FDG avid right hilar and subcarinal lymph nodes, largest right hilar node measuring 0.9 x 0.8 cm, previously 1.7 x 1.6 cm (series 2, image 27). Thyroid gland, trachea, and esophagus demonstrate no  significant findings. Lungs/Pleura: Interval decrease in size of a spiculated mass of the posterior right upper lobe, measuring 4.1 x 1.3 cm, previously 4.9 x 2.4 cm (series 4, image 68). A mixed solid and ground-glass lesion in the posterior right lower lobe is not appreciably changed in size, but does appear to be somewhat less solid, measuring 2.6 x 2.3 cm (series 4, image 86). Multiple additional lesions throughout the bilateral lungs of varying composition from ground-glass to predominantly solid and spiculated are not appreciably changed in size or composition. Mild centrilobular emphysema. No pleural effusion or pneumothorax. Musculoskeletal: No chest wall mass or suspicious bone lesions identified. CT ABDOMEN PELVIS FINDINGS Hepatobiliary: No solid liver abnormality is seen. No gallstones, gallbladder wall thickening, or biliary dilatation. Pancreas: Unremarkable. No pancreatic ductal dilatation or surrounding inflammatory changes. Spleen: Normal in size without significant abnormality. Adrenals/Urinary Tract: Adrenal glands are unremarkable. Kidneys are normal, without renal calculi, solid lesion, or hydronephrosis. Bladder is unremarkable. Stomach/Bowel: Stomach is within normal limits. Appendix appears normal. No evidence of bowel wall thickening, distention, or inflammatory changes. Vascular/Lymphatic: Aortic atherosclerosis. No enlarged abdominal or pelvic lymph nodes. Reproductive: No mass or other abnormality. Other: No abdominal wall hernia or abnormality. No abdominopelvic ascites. Musculoskeletal: No acute or significant osseous findings. IMPRESSION: 1. Interval decrease in size of a spiculated mass of the posterior right upper lobe. 2. Interval decrease in size of previously enlarged and FDG avid right hilar and subcarinal lymph nodes. 3. Findings above are consistent with treatment response of primary lung malignancy and nodal metastatic disease. 4. A mixed solid and ground-glass lesion in the  posterior right lower lobe is not appreciably changed in size, but does appear to be somewhat less solid, suggesting subtle treatment response of an additional synchronous adenocarcinoma. 5. Multiple additional lesions throughout the bilateral lungs of varying composition from ground-glass to predominantly solid and spiculated are not appreciably changed in size or composition, these lesions demonstrating variable FDG avidity on prior examination and remaining highly suspicious for multifocal synchronous adenocarcinoma. Continued attention on follow-up. 6. Emphysema. Aortic Atherosclerosis (ICD10-I70.0) and Emphysema (ICD10-J43.9). Electronically Signed   By: Eddie Candle M.D.   On: 06/25/2020 10:38   CT Abdomen Pelvis W Contrast  Result Date: 06/25/2020 CLINICAL DATA:  Non-small cell lung cancer staging EXAM: CT CHEST, ABDOMEN, AND PELVIS WITH CONTRAST TECHNIQUE: Multidetector CT imaging of the chest, abdomen and pelvis was performed following the standard protocol during bolus administration of intravenous contrast. CONTRAST:  149mL OMNIPAQUE IOHEXOL 300 MG/ML SOLN, additional oral enteric contrast COMPARISON:  CT chest, 03/27/2020, PET-CT, 04/02/2020 FINDINGS: CT CHEST FINDINGS Cardiovascular: Right chest port catheter. Aortic atherosclerosis. Normal heart size. No pericardial effusion. Mediastinum/Nodes: Interval decrease in size of previously enlarged and FDG avid right hilar and subcarinal lymph nodes, largest right hilar node measuring 0.9 x 0.8 cm, previously 1.7 x 1.6 cm (series 2, image 27). Thyroid gland, trachea, and esophagus demonstrate  no significant findings. Lungs/Pleura: Interval decrease in size of a spiculated mass of the posterior right upper lobe, measuring 4.1 x 1.3 cm, previously 4.9 x 2.4 cm (series 4, image 68). A mixed solid and ground-glass lesion in the posterior right lower lobe is not appreciably changed in size, but does appear to be somewhat less solid, measuring 2.6 x 2.3 cm  (series 4, image 86). Multiple additional lesions throughout the bilateral lungs of varying composition from ground-glass to predominantly solid and spiculated are not appreciably changed in size or composition. Mild centrilobular emphysema. No pleural effusion or pneumothorax. Musculoskeletal: No chest wall mass or suspicious bone lesions identified. CT ABDOMEN PELVIS FINDINGS Hepatobiliary: No solid liver abnormality is seen. No gallstones, gallbladder wall thickening, or biliary dilatation. Pancreas: Unremarkable. No pancreatic ductal dilatation or surrounding inflammatory changes. Spleen: Normal in size without significant abnormality. Adrenals/Urinary Tract: Adrenal glands are unremarkable. Kidneys are normal, without renal calculi, solid lesion, or hydronephrosis. Bladder is unremarkable. Stomach/Bowel: Stomach is within normal limits. Appendix appears normal. No evidence of bowel wall thickening, distention, or inflammatory changes. Vascular/Lymphatic: Aortic atherosclerosis. No enlarged abdominal or pelvic lymph nodes. Reproductive: No mass or other abnormality. Other: No abdominal wall hernia or abnormality. No abdominopelvic ascites. Musculoskeletal: No acute or significant osseous findings. IMPRESSION: 1. Interval decrease in size of a spiculated mass of the posterior right upper lobe. 2. Interval decrease in size of previously enlarged and FDG avid right hilar and subcarinal lymph nodes. 3. Findings above are consistent with treatment response of primary lung malignancy and nodal metastatic disease. 4. A mixed solid and ground-glass lesion in the posterior right lower lobe is not appreciably changed in size, but does appear to be somewhat less solid, suggesting subtle treatment response of an additional synchronous adenocarcinoma. 5. Multiple additional lesions throughout the bilateral lungs of varying composition from ground-glass to predominantly solid and spiculated are not appreciably changed in size  or composition, these lesions demonstrating variable FDG avidity on prior examination and remaining highly suspicious for multifocal synchronous adenocarcinoma. Continued attention on follow-up. 6. Emphysema. Aortic Atherosclerosis (ICD10-I70.0) and Emphysema (ICD10-J43.9). Electronically Signed   By: Eddie Candle M.D.   On: 06/25/2020 10:38    ASSESSMENT AND PLAN: This is a very pleasant 64 years old African-American male recently diagnosed with a stage IV (T3, N2, M1c)  non-small cell lung cancer, adenocarcinoma presented with right upper lobe lung mass in addition to right lower lobe, left lower lobe in addition to right hilar and mediastinal lymphadenopathy in addition to metastatic brain lesions diagnosed in October 2021. The patient has molecular studies by [employer] that showed no actionable mutations. He underwent SRS treatment to his brain lesion. The patient is currently undergoing systemic chemotherapy with carboplatin for AUC of 5, Alimta 500 mg/M2 and Keytruda 200 mg IV every 3 weeks status post 3 cycles.  The patient continues to tolerate his treatment well with no concerning adverse effect except for mild fatigue. He had repeat CT scan of the chest, abdomen pelvis performed recently.  I personally and independently reviewed the scan and discussed the results with the patient and his wife. His scan showed improvement of his disease with no concerning findings for progression. I recommended for him to proceed with cycle #4 today as planned. I will see the patient back for follow-up visit in 3 weeks for evaluation before starting cycle #5. The patient was advised to call immediately if he has any other concerning symptoms in the interval. The patient voices understanding of  current disease status and treatment options and is in agreement with the current care plan.  All questions were answered. The patient knows to call the clinic with any problems, questions or concerns. We can  certainly see the patient much sooner if necessary.  Disclaimer: This note was dictated with voice recognition software. Similar sounding words can inadvertently be transcribed and may not be corrected upon review.

## 2020-06-30 ENCOUNTER — Telehealth: Payer: Self-pay | Admitting: Internal Medicine

## 2020-06-30 NOTE — Telephone Encounter (Signed)
Scheduled per 1/11 los. Pt will receive an updated appt calendar at next visit appt notes

## 2020-07-03 ENCOUNTER — Other Ambulatory Visit: Payer: Self-pay

## 2020-07-03 ENCOUNTER — Inpatient Hospital Stay: Payer: 59

## 2020-07-03 ENCOUNTER — Encounter: Payer: Self-pay | Admitting: Internal Medicine

## 2020-07-03 DIAGNOSIS — C3491 Malignant neoplasm of unspecified part of right bronchus or lung: Secondary | ICD-10-CM

## 2020-07-03 DIAGNOSIS — Z95828 Presence of other vascular implants and grafts: Secondary | ICD-10-CM

## 2020-07-03 DIAGNOSIS — Z5112 Encounter for antineoplastic immunotherapy: Secondary | ICD-10-CM | POA: Diagnosis not present

## 2020-07-03 LAB — CMP (CANCER CENTER ONLY)
ALT: 35 U/L (ref 0–44)
AST: 34 U/L (ref 15–41)
Albumin: 4 g/dL (ref 3.5–5.0)
Alkaline Phosphatase: 43 U/L (ref 38–126)
Anion gap: 12 (ref 5–15)
BUN: 14 mg/dL (ref 8–23)
CO2: 25 mmol/L (ref 22–32)
Calcium: 9.4 mg/dL (ref 8.9–10.3)
Chloride: 98 mmol/L (ref 98–111)
Creatinine: 1.03 mg/dL (ref 0.61–1.24)
GFR, Estimated: >60 mL/min (ref >60)
Glucose, Bld: 89 mg/dL (ref 70–99)
Potassium: 4.2 mmol/L (ref 3.5–5.1)
Sodium: 135 mmol/L (ref 135–145)
Total Bilirubin: 0.5 mg/dL (ref 0.3–1.2)
Total Protein: 7.8 g/dL (ref 6.5–8.1)

## 2020-07-03 LAB — CBC WITH DIFFERENTIAL (CANCER CENTER ONLY)
Abs Immature Granulocytes: 0.01 10*3/uL (ref 0.00–0.07)
Basophils Absolute: 0 10*3/uL (ref 0.0–0.1)
Basophils Relative: 0 %
Eosinophils Absolute: 0 10*3/uL (ref 0.0–0.5)
Eosinophils Relative: 1 %
HCT: 32.8 % — ABNORMAL LOW (ref 39.0–52.0)
Hemoglobin: 11.2 g/dL — ABNORMAL LOW (ref 13.0–17.0)
Immature Granulocytes: 0 %
Lymphocytes Relative: 59 %
Lymphs Abs: 1.3 10*3/uL (ref 0.7–4.0)
MCH: 28.6 pg (ref 26.0–34.0)
MCHC: 34.1 g/dL (ref 30.0–36.0)
MCV: 83.7 fL (ref 80.0–100.0)
Monocytes Absolute: 0.3 10*3/uL (ref 0.1–1.0)
Monocytes Relative: 14 %
Neutro Abs: 0.6 10*3/uL — ABNORMAL LOW (ref 1.7–7.7)
Neutrophils Relative %: 26 %
Platelet Count: 109 10*3/uL — ABNORMAL LOW (ref 150–400)
RBC: 3.92 MIL/uL — ABNORMAL LOW (ref 4.22–5.81)
RDW: 16.1 % — ABNORMAL HIGH (ref 11.5–15.5)
WBC Count: 2.3 10*3/uL — ABNORMAL LOW (ref 4.0–10.5)
nRBC: 0 % (ref 0.0–0.2)

## 2020-07-03 MED ORDER — HEPARIN SOD (PORK) LOCK FLUSH 100 UNIT/ML IV SOLN
500.0000 [IU] | Freq: Once | INTRAVENOUS | Status: AC
  Administered 2020-07-03: 500 [IU]
  Filled 2020-07-03: qty 5

## 2020-07-03 MED ORDER — SODIUM CHLORIDE 0.9% FLUSH
10.0000 mL | Freq: Once | INTRAVENOUS | Status: AC
  Administered 2020-07-03: 10 mL
  Filled 2020-07-03: qty 10

## 2020-07-03 NOTE — Progress Notes (Signed)
Pthas been re enrolledw/ the [employer] program for [place] for $25,000 from 06/16/20- 06/15/21.His copay for [name] will be $25.

## 2020-07-07 ENCOUNTER — Telehealth: Payer: Self-pay | Admitting: Internal Medicine

## 2020-07-08 NOTE — Telephone Encounter (Signed)
Please refill as per office routine med refill policy (all routine meds refilled for 3 mo or monthly per pt preference up to one year from last visit, then month to month grace period for 3 mo, then further med refills will have to be denied)  

## 2020-07-10 ENCOUNTER — Inpatient Hospital Stay: Payer: 59

## 2020-07-10 ENCOUNTER — Other Ambulatory Visit: Payer: Self-pay

## 2020-07-10 DIAGNOSIS — Z5112 Encounter for antineoplastic immunotherapy: Secondary | ICD-10-CM | POA: Diagnosis not present

## 2020-07-10 DIAGNOSIS — Z95828 Presence of other vascular implants and grafts: Secondary | ICD-10-CM

## 2020-07-10 DIAGNOSIS — C3491 Malignant neoplasm of unspecified part of right bronchus or lung: Secondary | ICD-10-CM

## 2020-07-10 LAB — CMP (CANCER CENTER ONLY)
ALT: 34 U/L (ref 0–44)
AST: 31 U/L (ref 15–41)
Albumin: 3.6 g/dL (ref 3.5–5.0)
Alkaline Phosphatase: 50 U/L (ref 38–126)
Anion gap: 9 (ref 5–15)
BUN: 14 mg/dL (ref 8–23)
CO2: 26 mmol/L (ref 22–32)
Calcium: 9.1 mg/dL (ref 8.9–10.3)
Chloride: 104 mmol/L (ref 98–111)
Creatinine: 1.08 mg/dL (ref 0.61–1.24)
GFR, Estimated: >60 mL/min (ref >60)
Glucose, Bld: 89 mg/dL (ref 70–99)
Potassium: 4 mmol/L (ref 3.5–5.1)
Sodium: 139 mmol/L (ref 135–145)
Total Bilirubin: 0.4 mg/dL (ref 0.3–1.2)
Total Protein: 7.5 g/dL (ref 6.5–8.1)

## 2020-07-10 LAB — CBC WITH DIFFERENTIAL (CANCER CENTER ONLY)
Abs Immature Granulocytes: 0.03 10*3/uL (ref 0.00–0.07)
Basophils Absolute: 0 10*3/uL (ref 0.0–0.1)
Basophils Relative: 0 %
Eosinophils Absolute: 0.1 10*3/uL (ref 0.0–0.5)
Eosinophils Relative: 1 %
HCT: 30.9 % — ABNORMAL LOW (ref 39.0–52.0)
Hemoglobin: 10.5 g/dL — ABNORMAL LOW (ref 13.0–17.0)
Immature Granulocytes: 1 %
Lymphocytes Relative: 49 %
Lymphs Abs: 2.1 10*3/uL (ref 0.7–4.0)
MCH: 28.8 pg (ref 26.0–34.0)
MCHC: 34 g/dL (ref 30.0–36.0)
MCV: 84.9 fL (ref 80.0–100.0)
Monocytes Absolute: 0.7 10*3/uL (ref 0.1–1.0)
Monocytes Relative: 17 %
Neutro Abs: 1.4 10*3/uL — ABNORMAL LOW (ref 1.7–7.7)
Neutrophils Relative %: 32 %
Platelet Count: 127 10*3/uL — ABNORMAL LOW (ref 150–400)
RBC: 3.64 MIL/uL — ABNORMAL LOW (ref 4.22–5.81)
RDW: 16.4 % — ABNORMAL HIGH (ref 11.5–15.5)
WBC Count: 4.2 10*3/uL (ref 4.0–10.5)
nRBC: 0 % (ref 0.0–0.2)

## 2020-07-10 LAB — TSH: TSH: 1.94 u[IU]/mL (ref 0.320–4.118)

## 2020-07-10 MED ORDER — SODIUM CHLORIDE 0.9% FLUSH
10.0000 mL | Freq: Once | INTRAVENOUS | Status: AC
  Administered 2020-07-10: 10 mL
  Filled 2020-07-10: qty 10

## 2020-07-10 MED ORDER — HEPARIN SOD (PORK) LOCK FLUSH 100 UNIT/ML IV SOLN
500.0000 [IU] | Freq: Once | INTRAVENOUS | Status: AC
  Administered 2020-07-10: 500 [IU]
  Filled 2020-07-10: qty 5

## 2020-07-10 NOTE — Telephone Encounter (Signed)
Patient was seen on 1.5.2022 for an OV. Does he still need to schedule an appointment for a med refill?

## 2020-07-11 ENCOUNTER — Other Ambulatory Visit: Payer: Self-pay | Admitting: Internal Medicine

## 2020-07-11 NOTE — Progress Notes (Addendum)
  Radiation Oncology         [phone] ________________________________  Name: Nicolas Allen MRN: [id number]  Date: 04/20/2020  DOB: 07-Mar-1956  End of Treatment Note  Diagnosis:   Brain metastasis     Indication for treatment:  palliative       Radiation treatment dates:   04/20/20  Site/dose:  20 Gy in 1 fraction  ExacTrac, 6 vmat beams Max dose=129.2% PTV1 Rt Cerebellum 32mm PTV2Mid cerebellum 53mm PTV3Lt Parietal 54mm  Narrative: The patient tolerated radiation treatment well.   There were no signs of acute toxicity after treatment.  Plan: The patient has completed radiation treatment. The patient will return to radiation oncology clinic for routine followup in one month. I advised the patient to call or return sooner if they have any questions or concerns related to their recovery or treatment. ________________________________  ------------------------------------------------  Jodelle Gross, MD, PhD

## 2020-07-16 ENCOUNTER — Other Ambulatory Visit: Payer: Self-pay | Admitting: Physician Assistant

## 2020-07-16 DIAGNOSIS — C3491 Malignant neoplasm of unspecified part of right bronchus or lung: Secondary | ICD-10-CM

## 2020-07-17 ENCOUNTER — Inpatient Hospital Stay: Payer: 59

## 2020-07-17 ENCOUNTER — Inpatient Hospital Stay: Payer: 59 | Attending: Internal Medicine

## 2020-07-17 ENCOUNTER — Inpatient Hospital Stay (HOSPITAL_BASED_OUTPATIENT_CLINIC_OR_DEPARTMENT_OTHER): Payer: 59 | Admitting: Internal Medicine

## 2020-07-17 ENCOUNTER — Other Ambulatory Visit: Payer: Self-pay

## 2020-07-17 ENCOUNTER — Encounter: Payer: Self-pay | Admitting: Internal Medicine

## 2020-07-17 VITALS — BP 110/58 | HR 74 | Temp 97.2°F | Resp 17 | Ht 72.0 in | Wt 161.5 lb

## 2020-07-17 DIAGNOSIS — Z5111 Encounter for antineoplastic chemotherapy: Secondary | ICD-10-CM | POA: Diagnosis not present

## 2020-07-17 DIAGNOSIS — C3431 Malignant neoplasm of lower lobe, right bronchus or lung: Secondary | ICD-10-CM | POA: Insufficient documentation

## 2020-07-17 DIAGNOSIS — C3491 Malignant neoplasm of unspecified part of right bronchus or lung: Secondary | ICD-10-CM

## 2020-07-17 DIAGNOSIS — Z87891 Personal history of nicotine dependence: Secondary | ICD-10-CM | POA: Diagnosis not present

## 2020-07-17 DIAGNOSIS — Z5112 Encounter for antineoplastic immunotherapy: Secondary | ICD-10-CM | POA: Insufficient documentation

## 2020-07-17 DIAGNOSIS — C3411 Malignant neoplasm of upper lobe, right bronchus or lung: Secondary | ICD-10-CM | POA: Insufficient documentation

## 2020-07-17 DIAGNOSIS — C3432 Malignant neoplasm of lower lobe, left bronchus or lung: Secondary | ICD-10-CM | POA: Diagnosis not present

## 2020-07-17 DIAGNOSIS — Z79899 Other long term (current) drug therapy: Secondary | ICD-10-CM | POA: Insufficient documentation

## 2020-07-17 LAB — CMP (CANCER CENTER ONLY)
ALT: 31 U/L (ref 0–44)
AST: 27 U/L (ref 15–41)
Albumin: 3.6 g/dL (ref 3.5–5.0)
Alkaline Phosphatase: 44 U/L (ref 38–126)
Anion gap: 10 (ref 5–15)
BUN: 11 mg/dL (ref 8–23)
CO2: 23 mmol/L (ref 22–32)
Calcium: 8.9 mg/dL (ref 8.9–10.3)
Chloride: 105 mmol/L (ref 98–111)
Creatinine: 1.04 mg/dL (ref 0.61–1.24)
GFR, Estimated: >60 mL/min (ref >60)
Glucose, Bld: 153 mg/dL — ABNORMAL HIGH (ref 70–99)
Potassium: 3.7 mmol/L (ref 3.5–5.1)
Sodium: 138 mmol/L (ref 135–145)
Total Bilirubin: 0.3 mg/dL (ref 0.3–1.2)
Total Protein: 7 g/dL (ref 6.5–8.1)

## 2020-07-17 LAB — CBC WITH DIFFERENTIAL (CANCER CENTER ONLY)
Abs Immature Granulocytes: 0.04 10*3/uL (ref 0.00–0.07)
Basophils Absolute: 0 10*3/uL (ref 0.0–0.1)
Basophils Relative: 1 %
Eosinophils Absolute: 0.1 10*3/uL (ref 0.0–0.5)
Eosinophils Relative: 2 %
HCT: 30.2 % — ABNORMAL LOW (ref 39.0–52.0)
Hemoglobin: 10.2 g/dL — ABNORMAL LOW (ref 13.0–17.0)
Immature Granulocytes: 1 %
Lymphocytes Relative: 40 %
Lymphs Abs: 2 10*3/uL (ref 0.7–4.0)
MCH: 29.1 pg (ref 26.0–34.0)
MCHC: 33.8 g/dL (ref 30.0–36.0)
MCV: 86 fL (ref 80.0–100.0)
Monocytes Absolute: 0.5 10*3/uL (ref 0.1–1.0)
Monocytes Relative: 11 %
Neutro Abs: 2.3 10*3/uL (ref 1.7–7.7)
Neutrophils Relative %: 45 %
Platelet Count: 186 10*3/uL (ref 150–400)
RBC: 3.51 MIL/uL — ABNORMAL LOW (ref 4.22–5.81)
RDW: 17.6 % — ABNORMAL HIGH (ref 11.5–15.5)
WBC Count: 4.9 10*3/uL (ref 4.0–10.5)
nRBC: 0 % (ref 0.0–0.2)

## 2020-07-17 LAB — TSH: TSH: 2.048 u[IU]/mL (ref 0.320–4.118)

## 2020-07-17 MED ORDER — HEPARIN SOD (PORK) LOCK FLUSH 100 UNIT/ML IV SOLN
500.0000 [IU] | Freq: Once | INTRAVENOUS | Status: AC | PRN
  Administered 2020-07-17: 500 [IU]
  Filled 2020-07-17: qty 5

## 2020-07-17 MED ORDER — PROCHLORPERAZINE MALEATE 10 MG PO TABS
10.0000 mg | ORAL_TABLET | Freq: Once | ORAL | Status: AC
  Administered 2020-07-17: 10 mg via ORAL

## 2020-07-17 MED ORDER — SODIUM CHLORIDE 0.9% FLUSH
10.0000 mL | INTRAVENOUS | Status: DC | PRN
  Administered 2020-07-17: 10 mL
  Filled 2020-07-17: qty 10

## 2020-07-17 MED ORDER — PROCHLORPERAZINE MALEATE 10 MG PO TABS
ORAL_TABLET | ORAL | Status: AC
  Filled 2020-07-17: qty 1

## 2020-07-17 MED ORDER — SODIUM CHLORIDE 0.9 % IV SOLN
Freq: Once | INTRAVENOUS | Status: AC
  Filled 2020-07-17: qty 250

## 2020-07-17 MED ORDER — PEMBROLIZUMAB CHEMO INJECTION 100 MG/4ML
200.0000 mg | Freq: Once | INTRAVENOUS | Status: AC
  Administered 2020-07-17: 200 mg via INTRAVENOUS
  Filled 2020-07-17: qty 8

## 2020-07-17 MED ORDER — SODIUM CHLORIDE 0.9 % IV SOLN
500.0000 mg/m2 | Freq: Once | INTRAVENOUS | Status: AC
  Administered 2020-07-17: 900 mg via INTRAVENOUS
  Filled 2020-07-17: qty 20

## 2020-07-17 NOTE — Patient Instructions (Signed)
[facility] Discharge Instructions for Patients Receiving Chemotherapy  Today you received the following chemotherapy agents keytruda, alimta  To help prevent nausea and vomiting after your treatment, we encourage you to take your nausea medication as directed.   If you develop nausea and vomiting that is not controlled by your nausea medication, call the clinic.   BELOW ARE SYMPTOMS THAT SHOULD BE REPORTED IMMEDIATELY:  *FEVER GREATER THAN 100.5 F  *CHILLS WITH OR WITHOUT FEVER  NAUSEA AND VOMITING THAT IS NOT CONTROLLED WITH YOUR NAUSEA MEDICATION  *UNUSUAL SHORTNESS OF BREATH  *UNUSUAL BRUISING OR BLEEDING  TENDERNESS IN MOUTH AND THROAT WITH OR WITHOUT PRESENCE OF ULCERS  *URINARY PROBLEMS  *BOWEL PROBLEMS  UNUSUAL RASH Items with * indicate a potential emergency and should be followed up as soon as possible.  Feel free to call the clinic should you have any questions or concerns. The clinic phone number is [phone].  Please show the [facility] at check-in to the Emergency Department and triage nurse.

## 2020-07-17 NOTE — Progress Notes (Signed)
[facility] [phone]   [phone]  OFFICE PROGRESS NOTE  [name], MD [place] [address]  DIAGNOSIS: Stage IV (T3, N2, M1c) non-small cell lung cancer favoring adenocarcinoma presented with right upper lobe lung mass in addition to right lower lobe, left lower lobe in addition to right hilar and mediastinal lymphadenopathy in addition to metastatic brain lesions diagnosed in October 2021.  Molecular studies by [employer]:  [id number], 9.5%,   PRIOR THERAPY: SRS to 3 brain lesion under the care of Dr. Lisbeth Renshaw.  Scheduled for April 20, 2020  CURRENT THERAPY: Systemic chemotherapy with carboplatin for AUC of 5, Alimta 500 mg/M2 and Keytruda 200 mg IV every 3 weeks.  First dose April 24, 2020.  Status post 4 cycles.  Starting from cycle #5 the patient will be on maintenance treatment with Alimta and Keytruda every 3 weeks.  INTERVAL HISTORY: Nicolas Allen 65 y.o. male returns to the clinic today for follow-up visit accompanied by his wife.  The patient is feeling fine today with no concerning complaints.  He denied having any current chest pain, shortness of breath, cough or hemoptysis.  He denied having any nausea, vomiting, diarrhea or constipation.  He has no weight loss or night sweats.  He has no headache or visual changes.  He continues to tolerate his systemic chemotherapy fairly well.  The patient is here today for evaluation before starting the first cycle of his maintenance treatment with Alimta and Keytruda every 3 weeks.   MEDICAL HISTORY: Past Medical History:  Diagnosis Date  . Anemia 06/24/2012  . ANXIETY 02/03/2007  . Cancer ([place])   . Cervical disc disease 02/12/2012   S/p surgury jan 2013  . Erectile dysfunction 02/11/2011  . GERD (gastroesophageal reflux disease)   . GLUCOSE INTOLERANCE 01/04/2008  . HYPERLIPIDEMIA 02/03/2007  . HYPERTENSION 02/03/2007  . IBS (irritable bowel syndrome)   . Impaired  glucose tolerance 02/11/2011  . Smoker 08/22/2014  . Substance abuse ([place]) 2001   sober for 16 yrs    ALLERGIES:  has No Known Allergies.  MEDICATIONS:  Current Outpatient Medications  Medication Sig Dispense Refill  . amLODipine-benazepril (LOTREL) 10-20 MG capsule TAKE 1 CAPSULE BY MOUTH ONCE DAILY 90 capsule 3  . aspirin EC 81 MG tablet Take 81 mg by mouth daily. Swallow whole.    . b complex vitamins tablet Take 1 tablet by mouth daily.    Marland Kitchen dexamethasone (DECADRON) 4 MG tablet Take 1 tablet (4 mg total) by mouth 3 (three) times daily. 90 tablet 0  . folic acid (FOLVITE) 1 MG tablet Take 1 tablet (1 mg total) by mouth daily. 30 tablet 4  . Garlic 5361 MG CAPS Take 1,000 mg by mouth daily.     Marland Kitchen ibuprofen (ADVIL) 200 MG tablet Take 200 mg by mouth every 6 (six) hours as needed.    . lidocaine-prilocaine (EMLA) cream Apply to the Port-A-Cath site 30-60 minutes before chemotherapy. 30 g 0  . Omega-3 Fatty Acids (FISH OIL PO) Take 1 capsule by mouth daily.     . pravastatin (PRAVACHOL) 40 MG tablet TAKE 1 TABLET BY MOUTH ONCE DAILY 90 tablet 3  . prochlorperazine (COMPAZINE) 10 MG tablet Take 1 tablet (10 mg total) by mouth every 6 (six) hours as needed for nausea or vomiting. 30 tablet 0  . vardenafil (LEVITRA) 20 MG tablet TAKE 1 TABLET BY MOUTH AS NEEDED FOR  ERECTILE  DYSFUNCTION  10 tablet 5  . Vitamin D, Ergocalciferol, (DRISDOL) 1.25 MG (50000 UNIT) CAPS capsule Take 1 capsule (50,000 Units total) by mouth every 7 (seven) days. (Patient taking differently: Take 50,000 Units by mouth every Sunday.) 12 capsule 0   No current facility-administered medications for this visit.    SURGICAL HISTORY:  Past Surgical History:  Procedure Laterality Date  . BRONCHIAL BIOPSY  03/30/2020   Procedure: BRONCHIAL BIOPSIES;  Surgeon: Garner Nash, DO;  Location: [facility];  Service: Pulmonary;;  . BRONCHIAL BRUSHINGS  03/30/2020   Procedure: BRONCHIAL BRUSHINGS;  Surgeon: Garner Nash,  DO;  Location: [facility];  Service: Pulmonary;;  . BRONCHIAL NEEDLE ASPIRATION BIOPSY  03/30/2020   Procedure: BRONCHIAL NEEDLE ASPIRATION BIOPSIES;  Surgeon: Garner Nash, DO;  Location: [facility];  Service: Pulmonary;;  . BRONCHIAL WASHINGS  03/30/2020   Procedure: BRONCHIAL WASHINGS;  Surgeon: Garner Nash, DO;  Location: [facility];  Service: Pulmonary;;  . COLONOSCOPY  2007  . IR IMAGING GUIDED PORT INSERTION  04/23/2020  . neck fusion  2012   C4  . NO PAST SURGERIES    . VIDEO BRONCHOSCOPY WITH ENDOBRONCHIAL NAVIGATION N/A 03/30/2020   Procedure: VIDEO BRONCHOSCOPY WITH ENDOBRONCHIAL NAVIGATION;  Surgeon: Garner Nash, DO;  Location: [facility];  Service: Pulmonary;  Laterality: N/A;  . VIDEO BRONCHOSCOPY WITH ENDOBRONCHIAL ULTRASOUND N/A 03/30/2020   Procedure: VIDEO BRONCHOSCOPY WITH ENDOBRONCHIAL ULTRASOUND;  Surgeon: Garner Nash, DO;  Location: [facility];  Service: Pulmonary;  Laterality: N/A;    REVIEW OF SYSTEMS:  A comprehensive review of systems was negative.   PHYSICAL EXAMINATION: General appearance: alert, cooperative and no distress Head: Normocephalic, without obvious abnormality, atraumatic Neck: no adenopathy, no JVD, supple, symmetrical, trachea midline and thyroid not enlarged, symmetric, no tenderness/mass/nodules Lymph nodes: Cervical, supraclavicular, and axillary nodes normal. Resp: clear to auscultation bilaterally Back: symmetric, no curvature. ROM normal. No CVA tenderness. Cardio: regular rate and rhythm, S1, S2 normal, no murmur, click, rub or gallop GI: soft, non-tender; bowel sounds normal; no masses,  no organomegaly Extremities: extremities normal, atraumatic, no cyanosis or edema  ECOG PERFORMANCE STATUS: 1 - Symptomatic but completely ambulatory  Blood pressure (!) 110/58, pulse 74, temperature (!) 97.2 F (36.2 C), temperature source Tympanic, resp. rate 17, height 6' (1.829 m), weight 161 lb 8 oz (73.3 kg), SpO2 100  %.  LABORATORY DATA: Lab Results  Component Value Date   WBC 4.9 07/17/2020   HGB 10.2 (L) 07/17/2020   HCT 30.2 (L) 07/17/2020   MCV 86.0 07/17/2020   PLT 186 07/17/2020      Chemistry      Component Value Date/Time   NA 138 07/17/2020 0840   K 3.7 07/17/2020 0840   CL 105 07/17/2020 0840   CO2 23 07/17/2020 0840   BUN 11 07/17/2020 0840   CREATININE 1.04 07/17/2020 0840   CREATININE 0.88 02/21/2020 1115      Component Value Date/Time   CALCIUM 8.9 07/17/2020 0840   ALKPHOS 44 07/17/2020 0840   AST 27 07/17/2020 0840   ALT 31 07/17/2020 0840   BILITOT 0.3 07/17/2020 0840       RADIOGRAPHIC STUDIES: CT Chest W Contrast  Result Date: 06/25/2020 CLINICAL DATA:  Non-small cell lung cancer staging EXAM: CT CHEST, ABDOMEN, AND PELVIS WITH CONTRAST TECHNIQUE: Multidetector CT imaging of the chest, abdomen and pelvis was performed following the standard protocol during bolus administration of intravenous contrast. CONTRAST:  131mL OMNIPAQUE IOHEXOL 300 MG/ML SOLN, additional oral enteric  contrast COMPARISON:  CT chest, 03/27/2020, PET-CT, 04/02/2020 FINDINGS: CT CHEST FINDINGS Cardiovascular: Right chest port catheter. Aortic atherosclerosis. Normal heart size. No pericardial effusion. Mediastinum/Nodes: Interval decrease in size of previously enlarged and FDG avid right hilar and subcarinal lymph nodes, largest right hilar node measuring 0.9 x 0.8 cm, previously 1.7 x 1.6 cm (series 2, image 27). Thyroid gland, trachea, and esophagus demonstrate no significant findings. Lungs/Pleura: Interval decrease in size of a spiculated mass of the posterior right upper lobe, measuring 4.1 x 1.3 cm, previously 4.9 x 2.4 cm (series 4, image 68). A mixed solid and ground-glass lesion in the posterior right lower lobe is not appreciably changed in size, but does appear to be somewhat less solid, measuring 2.6 x 2.3 cm (series 4, image 86). Multiple additional lesions throughout the bilateral lungs of  varying composition from ground-glass to predominantly solid and spiculated are not appreciably changed in size or composition. Mild centrilobular emphysema. No pleural effusion or pneumothorax. Musculoskeletal: No chest wall mass or suspicious bone lesions identified. CT ABDOMEN PELVIS FINDINGS Hepatobiliary: No solid liver abnormality is seen. No gallstones, gallbladder wall thickening, or biliary dilatation. Pancreas: Unremarkable. No pancreatic ductal dilatation or surrounding inflammatory changes. Spleen: Normal in size without significant abnormality. Adrenals/Urinary Tract: Adrenal glands are unremarkable. Kidneys are normal, without renal calculi, solid lesion, or hydronephrosis. Bladder is unremarkable. Stomach/Bowel: Stomach is within normal limits. Appendix appears normal. No evidence of bowel wall thickening, distention, or inflammatory changes. Vascular/Lymphatic: Aortic atherosclerosis. No enlarged abdominal or pelvic lymph nodes. Reproductive: No mass or other abnormality. Other: No abdominal wall hernia or abnormality. No abdominopelvic ascites. Musculoskeletal: No acute or significant osseous findings. IMPRESSION: 1. Interval decrease in size of a spiculated mass of the posterior right upper lobe. 2. Interval decrease in size of previously enlarged and FDG avid right hilar and subcarinal lymph nodes. 3. Findings above are consistent with treatment response of primary lung malignancy and nodal metastatic disease. 4. A mixed solid and ground-glass lesion in the posterior right lower lobe is not appreciably changed in size, but does appear to be somewhat less solid, suggesting subtle treatment response of an additional synchronous adenocarcinoma. 5. Multiple additional lesions throughout the bilateral lungs of varying composition from ground-glass to predominantly solid and spiculated are not appreciably changed in size or composition, these lesions demonstrating variable FDG avidity on prior  examination and remaining highly suspicious for multifocal synchronous adenocarcinoma. Continued attention on follow-up. 6. Emphysema. Aortic Atherosclerosis (ICD10-I70.0) and Emphysema (ICD10-J43.9). Electronically Signed   By: Eddie Candle M.D.   On: 06/25/2020 10:38   CT Abdomen Pelvis W Contrast  Result Date: 06/25/2020 CLINICAL DATA:  Non-small cell lung cancer staging EXAM: CT CHEST, ABDOMEN, AND PELVIS WITH CONTRAST TECHNIQUE: Multidetector CT imaging of the chest, abdomen and pelvis was performed following the standard protocol during bolus administration of intravenous contrast. CONTRAST:  158mL OMNIPAQUE IOHEXOL 300 MG/ML SOLN, additional oral enteric contrast COMPARISON:  CT chest, 03/27/2020, PET-CT, 04/02/2020 FINDINGS: CT CHEST FINDINGS Cardiovascular: Right chest port catheter. Aortic atherosclerosis. Normal heart size. No pericardial effusion. Mediastinum/Nodes: Interval decrease in size of previously enlarged and FDG avid right hilar and subcarinal lymph nodes, largest right hilar node measuring 0.9 x 0.8 cm, previously 1.7 x 1.6 cm (series 2, image 27). Thyroid gland, trachea, and esophagus demonstrate no significant findings. Lungs/Pleura: Interval decrease in size of a spiculated mass of the posterior right upper lobe, measuring 4.1 x 1.3 cm, previously 4.9 x 2.4 cm (series 4, image 68). A mixed solid  and ground-glass lesion in the posterior right lower lobe is not appreciably changed in size, but does appear to be somewhat less solid, measuring 2.6 x 2.3 cm (series 4, image 86). Multiple additional lesions throughout the bilateral lungs of varying composition from ground-glass to predominantly solid and spiculated are not appreciably changed in size or composition. Mild centrilobular emphysema. No pleural effusion or pneumothorax. Musculoskeletal: No chest wall mass or suspicious bone lesions identified. CT ABDOMEN PELVIS FINDINGS Hepatobiliary: No solid liver abnormality is seen. No  gallstones, gallbladder wall thickening, or biliary dilatation. Pancreas: Unremarkable. No pancreatic ductal dilatation or surrounding inflammatory changes. Spleen: Normal in size without significant abnormality. Adrenals/Urinary Tract: Adrenal glands are unremarkable. Kidneys are normal, without renal calculi, solid lesion, or hydronephrosis. Bladder is unremarkable. Stomach/Bowel: Stomach is within normal limits. Appendix appears normal. No evidence of bowel wall thickening, distention, or inflammatory changes. Vascular/Lymphatic: Aortic atherosclerosis. No enlarged abdominal or pelvic lymph nodes. Reproductive: No mass or other abnormality. Other: No abdominal wall hernia or abnormality. No abdominopelvic ascites. Musculoskeletal: No acute or significant osseous findings. IMPRESSION: 1. Interval decrease in size of a spiculated mass of the posterior right upper lobe. 2. Interval decrease in size of previously enlarged and FDG avid right hilar and subcarinal lymph nodes. 3. Findings above are consistent with treatment response of primary lung malignancy and nodal metastatic disease. 4. A mixed solid and ground-glass lesion in the posterior right lower lobe is not appreciably changed in size, but does appear to be somewhat less solid, suggesting subtle treatment response of an additional synchronous adenocarcinoma. 5. Multiple additional lesions throughout the bilateral lungs of varying composition from ground-glass to predominantly solid and spiculated are not appreciably changed in size or composition, these lesions demonstrating variable FDG avidity on prior examination and remaining highly suspicious for multifocal synchronous adenocarcinoma. Continued attention on follow-up. 6. Emphysema. Aortic Atherosclerosis (ICD10-I70.0) and Emphysema (ICD10-J43.9). Electronically Signed   By: Eddie Candle M.D.   On: 06/25/2020 10:38    ASSESSMENT AND PLAN: This is a very pleasant 65 years old African-American male  recently diagnosed with a stage IV (T3, N2, M1c)  non-small cell lung cancer, adenocarcinoma presented with right upper lobe lung mass in addition to right lower lobe, left lower lobe in addition to right hilar and mediastinal lymphadenopathy in addition to metastatic brain lesions diagnosed in October 2021. The patient has molecular studies by [employer] that showed no actionable mutations. He underwent SRS treatment to his brain lesion. The patient is currently undergoing systemic chemotherapy with carboplatin for AUC of 5, Alimta 500 mg/M2 and Keytruda 200 mg IV every 3 weeks status post 4 cycles.  Starting from cycle #5 the patient will be on maintenance treatment with Alimta and Keytruda every 3 weeks. He has been tolerating this treatment well with no concerning adverse effects. I recommended for the patient to proceed with cycle #5 today as planned. He will come back for follow-up visit in 3 weeks for evaluation before starting cycle #6. The patient voices understanding of current disease status and treatment options and is in agreement with the current care plan.  All questions were answered. The patient knows to call the clinic with any problems, questions or concerns. We can certainly see the patient much sooner if necessary.  Disclaimer: This note was dictated with voice recognition software. Similar sounding words can inadvertently be transcribed and may not be corrected upon review.

## 2020-07-18 ENCOUNTER — Telehealth: Payer: Self-pay | Admitting: Internal Medicine

## 2020-07-18 NOTE — Telephone Encounter (Signed)
Scheduled per 2/1 los. Pt will receive an updated appt calendar per next visit appt notes

## 2020-07-23 ENCOUNTER — Encounter: Payer: Self-pay | Admitting: Radiation Oncology

## 2020-07-23 ENCOUNTER — Other Ambulatory Visit: Payer: Self-pay

## 2020-07-23 ENCOUNTER — Telehealth: Payer: Self-pay

## 2020-07-23 NOTE — Telephone Encounter (Signed)
Spoke with patient in regards to telephone visit with Shona Simpson PA on 07/30/20 @ 3:00pm Patient verbalized understanding of appointment date and time.Meaningful use questions were reviewed. TM

## 2020-07-24 ENCOUNTER — Ambulatory Visit
Admission: RE | Admit: 2020-07-24 | Discharge: 2020-07-24 | Disposition: A | Discharge status: Home / Self Care | Payer: 59 | Source: Ambulatory Visit | Attending: Radiation Oncology | Admitting: Radiation Oncology

## 2020-07-24 ENCOUNTER — Other Ambulatory Visit: Payer: Self-pay

## 2020-07-24 DIAGNOSIS — C7931 Secondary malignant neoplasm of brain: Secondary | ICD-10-CM

## 2020-07-24 MED ORDER — GADOBENATE DIMEGLUMINE 529 MG/ML IV SOLN
14.0000 mL | Freq: Once | INTRAVENOUS | Status: AC | PRN
  Administered 2020-07-24: 14 mL via INTRAVENOUS

## 2020-07-26 ENCOUNTER — Other Ambulatory Visit: Payer: 59

## 2020-07-30 ENCOUNTER — Other Ambulatory Visit: Payer: Self-pay

## 2020-07-30 ENCOUNTER — Ambulatory Visit
Admission: RE | Admit: 2020-07-30 | Discharge: 2020-07-30 | Disposition: A | Discharge status: Home / Self Care | Payer: 59 | Source: Ambulatory Visit | Attending: Radiation Oncology | Admitting: Radiation Oncology

## 2020-07-30 ENCOUNTER — Inpatient Hospital Stay: Payer: 59

## 2020-07-30 DIAGNOSIS — C7931 Secondary malignant neoplasm of brain: Secondary | ICD-10-CM

## 2020-07-30 DIAGNOSIS — C3491 Malignant neoplasm of unspecified part of right bronchus or lung: Secondary | ICD-10-CM

## 2020-07-30 NOTE — Progress Notes (Signed)
Radiation Oncology         [phone] ________________________________  Outpatient Follow Up - Conducted via telephone due to current COVID-19 concerns for limiting patient exposure  I spoke with the patient to conduct this consult visit via telephone to spare the patient unnecessary potential exposure in the healthcare setting during the current COVID-19 pandemic. The patient was notified in advance and was offered a [facility] meeting to allow for face to face communication but unfortunately reported that they did not have the appropriate resources/technology to support such a visit and instead preferred to proceed with a telephone visit.   Name: Nicolas Allen        MRN: [id number]  Date of Service: 07/30/2020 DOB: 1955/08/02  OJ:JKKX, Hunt Oris, MD  Curt Bears, MD     REFERRING PHYSICIAN: Curt Bears, MD   DIAGNOSIS: The primary encounter diagnosis was Brain metastases [facility]). A diagnosis of Non-small cell carcinoma of right lung, stage 4 (HCC) was also pertinent to this visit.   HISTORY OF PRESENT ILLNESS: Nicolas Allen is a 65 y.o. malewith a history of stage IV non-small cell lung cancer, adenocarcinoma involving multifocal disease in the lung and 3 brain metastases. He was diagnosed in the fall of 2021 and found to have metastatic disease in the lung and brain for which he has received stereotactic radiosurgery [facility]) with Dr. Lisbeth Renshaw and has continued on chemo/immunotherapy with Dr. Julien Nordmann. He had a restaging surveillance MRI of the brain on 07/24/20 that showed improvement in his three treated lesions. He does not have any new disease. He does have a stable 2.4 cm lesion in the right parotid gland, and a stable retention cyst in the right maxillary sinus. He continues with chemo/immunotherapy and his recent restaging scans last month showed improvement in his known sites of disease.    PREVIOUS RADIATION THERAPY:  04/20/20 SRS Treatment: Each site below was treated to 20 Gy in  1 fraction PTV1 Rt Cerebellum 96mm PTV2Mid cerebellum 68mm PTV3Lt Parietal 84mm    PAST MEDICAL HISTORY:  Past Medical History:  Diagnosis Date  . Anemia 06/24/2012  . ANXIETY 02/03/2007  . Cancer ([place])   . Cervical disc disease 02/12/2012   S/p surgury jan 2013  . Erectile dysfunction 02/11/2011  . GERD (gastroesophageal reflux disease)   . GLUCOSE INTOLERANCE 01/04/2008  . HYPERLIPIDEMIA 02/03/2007  . HYPERTENSION 02/03/2007  . IBS (irritable bowel syndrome)   . Impaired glucose tolerance 02/11/2011  . Smoker 08/22/2014  . Substance abuse ([place]) 2001   sober for 16 yrs       PAST SURGICAL HISTORY: Past Surgical History:  Procedure Laterality Date  . BRONCHIAL BIOPSY  03/30/2020   Procedure: BRONCHIAL BIOPSIES;  Surgeon: Garner Nash, DO;  Location: [place] [facility];  Service: Pulmonary;;  . BRONCHIAL BRUSHINGS  03/30/2020   Procedure: BRONCHIAL BRUSHINGS;  Surgeon: Garner Nash, DO;  Location: [place] [facility];  Service: Pulmonary;;  . BRONCHIAL NEEDLE ASPIRATION BIOPSY  03/30/2020   Procedure: BRONCHIAL NEEDLE ASPIRATION BIOPSIES;  Surgeon: Garner Nash, DO;  Location: [place] [facility];  Service: Pulmonary;;  . BRONCHIAL WASHINGS  03/30/2020   Procedure: BRONCHIAL WASHINGS;  Surgeon: Garner Nash, DO;  Location: [place];  Service: Pulmonary;;  . COLONOSCOPY  2007  . IR IMAGING GUIDED PORT INSERTION  04/23/2020  . neck fusion  2012   C4  . NO PAST SURGERIES    . VIDEO BRONCHOSCOPY WITH ENDOBRONCHIAL NAVIGATION N/A 03/30/2020   Procedure: VIDEO BRONCHOSCOPY WITH ENDOBRONCHIAL NAVIGATION;  Surgeon: Garner Nash, DO;  Location: [place] [facility];  Service: Pulmonary;  Laterality: N/A;  . VIDEO BRONCHOSCOPY WITH ENDOBRONCHIAL ULTRASOUND N/A 03/30/2020   Procedure: VIDEO BRONCHOSCOPY WITH ENDOBRONCHIAL ULTRASOUND;  Surgeon: Garner Nash, DO;  Location: [place];  Service: Pulmonary;  Laterality: N/A;     FAMILY HISTORY:  Family History  Problem Relation Age of Onset   . Cancer Mother        esophagus  . Cancer Cousin   . Stroke Other   . Hypertension Other   . Colon cancer Neg Hx      SOCIAL HISTORY:  reports that he quit smoking about 5 months ago. His smoking use included cigarettes. He has a 45.00 pack-year smoking history. He has quit using smokeless tobacco.  His smokeless tobacco use included chew. He reports that he does not drink alcohol and does not use drugs. The patient is married and resides in [place]. 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   ALLERGIES: Patient has no known allergies.   MEDICATIONS:  Current Outpatient Medications  Medication Sig Dispense Refill  . amLODipine-benazepril (LOTREL) 10-20 MG capsule TAKE 1 CAPSULE BY MOUTH ONCE DAILY 90 capsule 3  . aspirin EC 81 MG tablet Take 81 mg by mouth daily. Swallow whole.    . b complex vitamins tablet Take 1 tablet by mouth daily.    Marland Kitchen dexamethasone (DECADRON) 4 MG tablet Take 1 tablet (4 mg total) by mouth 3 (three) times daily. 90 tablet 0  . folic acid (FOLVITE) 1 MG tablet Take 1 tablet (1 mg total) by mouth daily. 30 tablet 4  . Garlic 9811 MG CAPS Take 1,000 mg by mouth daily.     Marland Kitchen ibuprofen (ADVIL) 200 MG tablet Take 200 mg by mouth every 6 (six) hours as needed.    . lidocaine-prilocaine (EMLA) cream Apply to the Port-A-Cath site 30-60 minutes before chemotherapy. 30 g 0  . Omega-3 Fatty Acids (FISH OIL PO) Take 1 capsule by mouth daily.     . pravastatin (PRAVACHOL) 40 MG tablet TAKE 1 TABLET BY MOUTH ONCE DAILY 90 tablet 3  . prochlorperazine (COMPAZINE) 10 MG tablet Take 1 tablet (10 mg total) by mouth every 6 (six) hours as needed for nausea or vomiting. 30 tablet 0  . vardenafil (LEVITRA) 20 MG tablet TAKE 1 TABLET BY MOUTH AS NEEDED FOR  ERECTILE  DYSFUNCTION 10 tablet 5  . Vitamin D, Ergocalciferol, (DRISDOL) 1.25 MG (50000 UNIT) CAPS capsule Take 1 capsule (50,000 Units total) by mouth every 7 (seven) days. (Patient taking  differently: Take 50,000 Units by mouth every Sunday.) 12 capsule 0   No current facility-administered medications for this encounter.     REVIEW OF SYSTEMS: On review of systems the patient reports he is doing very well, he continues to have a nonproductive dry cough at times but states that using cough drops seem to help alleviate his symptoms. He denies any headaches or visual changes, runny nose or difficulty with chewing.  PHYSICAL EXAM:  Unable to assess given encounter type.      ECOG = 1  0 - Asymptomatic (Fully active, able to carry on all predisease activities without restriction)  1 - Symptomatic but completely ambulatory (Restricted in physically strenuous activity but ambulatory and able to carry out work of a light or sedentary nature. For example, light housework, office work)  2 - Symptomatic, <50% in bed during the day (Ambulatory and capable of all self care but  unable to carry out any work activities. Up and about more than 50% of waking hours)  3 - Symptomatic, >50% in bed, but not bedbound (Capable of only limited self-care, confined to bed or chair 50% or more of waking hours)  4 - Bedbound (Completely disabled. Cannot carry on any self-care. Totally confined to bed or chair)  5 - Death   Eustace Pen MM, Creech RH, Tormey DC, et al. [phone]). "Toxicity and response criteria of the [facility]". [place] Oncol. 5 (6): 649-55    LABORATORY DATA:  Lab Results  Component Value Date   WBC 4.9 07/17/2020   HGB 10.2 (L) 07/17/2020   HCT 30.2 (L) 07/17/2020   MCV 86.0 07/17/2020   PLT 186 07/17/2020   Lab Results  Component Value Date   NA 138 07/17/2020   K 3.7 07/17/2020   CL 105 07/17/2020   CO2 23 07/17/2020   Lab Results  Component Value Date   ALT 31 07/17/2020   AST 27 07/17/2020   ALKPHOS 44 07/17/2020   BILITOT 0.3 07/17/2020      RADIOGRAPHY: MR Brain W Wo Contrast  Result Date: 07/24/2020 CLINICAL DATA:  Brain  metastasis.  Surveillance. EXAM: MRI HEAD WITHOUT AND WITH CONTRAST TECHNIQUE: Multiplanar, multiecho pulse sequences of the brain and surrounding structures were obtained without and with intravenous contrast. CONTRAST:  1mL MULTIHANCE GADOBENATE DIMEGLUMINE 529 MG/ML IV SOLN COMPARISON:  MRI of the brain April 12, 2020. FINDINGS: Brain: No acute infarction, hemorrhage, hydrocephalus or extra-axial collection. Scattered foci of T2 hyperintensity within the white matter of the cerebral hemispheres, nonspecific, not significantly changed from prior MRI. Interval decreasing size of the 3 metastatic lesions the described on prior MRI, annotated on series 11: Left parietal lobe, 1-2 mm (4 mm on prior), image 122; Cerebellar vermis, 5 mm (9 mm on prior), image 50; Right cerebellar hemisphere, 1 mm (4 mm on prior), image 48. No new lesion identified. Vascular: Normal flow voids. Skull and upper cervical spine: Normal marrow signal. Sinuses/Orbits: Mucous retention cyst in the right maxillary sinus. The orbits are maintained. Other: 2.4 cm enhancing right parotid lesion. IMPRESSION: 1. Evidence of treatment response with interval decreasing size of the 3 metastatic lesions the brain. No new lesion identified. 2. Stable 2.4 cm enhancing right parotid lesion. Electronically Signed   By: Pedro Earls M.D.   On: 07/24/2020 13:53       IMPRESSION/PLAN: 1. Stage IV non-small cell lung cancer, adenocarcinoma involving multifocal disease in the lung and brain metastases. 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 I think he can certainly do this as long as his Port-A-Cath site is not bothersome. We discussed the rationale for continuing in surveillance for his brain every 3 months, and after 1 years, increasing those intervals. He will let us know if he has questions or concerns prior to his next visit.  Otherwise he will continue with systemic therapy under the care of Dr. Julien Nordmann. 2. Right parotid nodule and mucous retention cyst in the right maxillary sinus. The patient is asymptomatic, if these sites were to change and enlarge we would encourage him to go to be evaluated by ENT more strongly however at this time he is asymptomatic, and we will follow these expectantly.  Given current concerns for patient exposure during the COVID-19 pandemic, this encounter was conducted via telephone.  The patient has provided  two factor identification and has given verbal consent for this type of encounter and has been advised to only accept a meeting of this type in a secure network environment. The time spent during this encounter was 35 minutes including preparation, discussion, and coordination of the patient's care. The attendants for this meeting include Hayden Pedro  and NICANOR MENDOLIA and his wife Jovanny Stephanie. During the encounter,  Hayden Pedro was located at [facility] Radiation Oncology Department.  Abe GADIEL JOHN was located at home with his wife Beverlee Nims.      Carola Rhine, PAC

## 2020-08-07 ENCOUNTER — Inpatient Hospital Stay: Payer: 59

## 2020-08-07 ENCOUNTER — Other Ambulatory Visit: Payer: Self-pay

## 2020-08-07 ENCOUNTER — Encounter: Payer: Self-pay | Admitting: Internal Medicine

## 2020-08-07 ENCOUNTER — Inpatient Hospital Stay (HOSPITAL_BASED_OUTPATIENT_CLINIC_OR_DEPARTMENT_OTHER): Payer: 59 | Admitting: Internal Medicine

## 2020-08-07 VITALS — BP 118/72 | HR 75 | Temp 97.0°F | Resp 17 | Ht 72.0 in | Wt 161.8 lb

## 2020-08-07 DIAGNOSIS — C3491 Malignant neoplasm of unspecified part of right bronchus or lung: Secondary | ICD-10-CM

## 2020-08-07 DIAGNOSIS — C3490 Malignant neoplasm of unspecified part of unspecified bronchus or lung: Secondary | ICD-10-CM | POA: Diagnosis not present

## 2020-08-07 DIAGNOSIS — Z5112 Encounter for antineoplastic immunotherapy: Secondary | ICD-10-CM | POA: Diagnosis not present

## 2020-08-07 DIAGNOSIS — Z5111 Encounter for antineoplastic chemotherapy: Secondary | ICD-10-CM

## 2020-08-07 DIAGNOSIS — I10 Essential (primary) hypertension: Secondary | ICD-10-CM

## 2020-08-07 DIAGNOSIS — C7931 Secondary malignant neoplasm of brain: Secondary | ICD-10-CM

## 2020-08-07 DIAGNOSIS — Z95828 Presence of other vascular implants and grafts: Secondary | ICD-10-CM

## 2020-08-07 LAB — CMP (CANCER CENTER ONLY)
ALT: 43 U/L (ref 0–44)
AST: 42 U/L — ABNORMAL HIGH (ref 15–41)
Albumin: 3.6 g/dL (ref 3.5–5.0)
Alkaline Phosphatase: 47 U/L (ref 38–126)
Anion gap: 9 (ref 5–15)
BUN: 13 mg/dL (ref 8–23)
CO2: 24 mmol/L (ref 22–32)
Calcium: 8.8 mg/dL — ABNORMAL LOW (ref 8.9–10.3)
Chloride: 106 mmol/L (ref 98–111)
Creatinine: 0.99 mg/dL (ref 0.61–1.24)
GFR, Estimated: >60 mL/min (ref >60)
Glucose, Bld: 149 mg/dL — ABNORMAL HIGH (ref 70–99)
Potassium: 3.6 mmol/L (ref 3.5–5.1)
Sodium: 139 mmol/L (ref 135–145)
Total Bilirubin: 0.3 mg/dL (ref 0.3–1.2)
Total Protein: 7 g/dL (ref 6.5–8.1)

## 2020-08-07 LAB — CBC WITH DIFFERENTIAL (CANCER CENTER ONLY)
Abs Immature Granulocytes: 0.04 10*3/uL (ref 0.00–0.07)
Basophils Absolute: 0 10*3/uL (ref 0.0–0.1)
Basophils Relative: 1 %
Eosinophils Absolute: 0.3 10*3/uL (ref 0.0–0.5)
Eosinophils Relative: 5 %
HCT: 29.5 % — ABNORMAL LOW (ref 39.0–52.0)
Hemoglobin: 9.9 g/dL — ABNORMAL LOW (ref 13.0–17.0)
Immature Granulocytes: 1 %
Lymphocytes Relative: 31 %
Lymphs Abs: 1.7 10*3/uL (ref 0.7–4.0)
MCH: 29.5 pg (ref 26.0–34.0)
MCHC: 33.6 g/dL (ref 30.0–36.0)
MCV: 87.8 fL (ref 80.0–100.0)
Monocytes Absolute: 0.9 10*3/uL (ref 0.1–1.0)
Monocytes Relative: 16 %
Neutro Abs: 2.6 10*3/uL (ref 1.7–7.7)
Neutrophils Relative %: 46 %
Platelet Count: 218 10*3/uL (ref 150–400)
RBC: 3.36 MIL/uL — ABNORMAL LOW (ref 4.22–5.81)
RDW: 17.1 % — ABNORMAL HIGH (ref 11.5–15.5)
WBC Count: 5.5 10*3/uL (ref 4.0–10.5)
nRBC: 0 % (ref 0.0–0.2)

## 2020-08-07 LAB — TSH: TSH: 1.421 u[IU]/mL (ref 0.320–4.118)

## 2020-08-07 MED ORDER — SODIUM CHLORIDE 0.9 % IV SOLN
Freq: Once | INTRAVENOUS | Status: AC
  Filled 2020-08-07: qty 250

## 2020-08-07 MED ORDER — PROCHLORPERAZINE MALEATE 10 MG PO TABS
10.0000 mg | ORAL_TABLET | Freq: Once | ORAL | Status: AC
  Administered 2020-08-07: 10 mg via ORAL

## 2020-08-07 MED ORDER — SODIUM CHLORIDE 0.9% FLUSH
10.0000 mL | INTRAVENOUS | Status: DC | PRN
  Administered 2020-08-07: 10 mL
  Filled 2020-08-07: qty 10

## 2020-08-07 MED ORDER — SODIUM CHLORIDE 0.9% FLUSH
10.0000 mL | Freq: Once | INTRAVENOUS | Status: AC
  Administered 2020-08-07: 10 mL
  Filled 2020-08-07: qty 10

## 2020-08-07 MED ORDER — CYANOCOBALAMIN 1000 MCG/ML IJ SOLN
INTRAMUSCULAR | Status: AC
  Filled 2020-08-07: qty 1

## 2020-08-07 MED ORDER — SODIUM CHLORIDE 0.9 % IV SOLN
200.0000 mg | Freq: Once | INTRAVENOUS | Status: AC
  Administered 2020-08-07: 200 mg via INTRAVENOUS
  Filled 2020-08-07: qty 8

## 2020-08-07 MED ORDER — HEPARIN SOD (PORK) LOCK FLUSH 100 UNIT/ML IV SOLN
500.0000 [IU] | Freq: Once | INTRAVENOUS | Status: AC | PRN
Start: 2020-08-07 — End: 2020-08-07
  Administered 2020-08-07: 500 [IU]
  Filled 2020-08-07: qty 5

## 2020-08-07 MED ORDER — PEMETREXED DISODIUM CHEMO INJECTION 500 MG
500.0000 mg/m2 | Freq: Once | INTRAVENOUS | Status: AC
  Administered 2020-08-07: 900 mg via INTRAVENOUS
  Filled 2020-08-07: qty 20

## 2020-08-07 MED ORDER — CYANOCOBALAMIN 1000 MCG/ML IJ SOLN
1000.0000 ug | Freq: Once | INTRAMUSCULAR | Status: AC
  Administered 2020-08-07: 1000 ug via INTRAMUSCULAR

## 2020-08-07 NOTE — Progress Notes (Signed)
[facility] [phone]   [phone]  OFFICE PROGRESS NOTE  [name], MD [place] [address]  DIAGNOSIS: Stage IV (T3, N2, M1c) non-small cell lung cancer favoring adenocarcinoma presented with right upper lobe lung mass in addition to right lower lobe, left lower lobe in addition to right hilar and mediastinal lymphadenopathy in addition to metastatic brain lesions diagnosed in October 2021.  Molecular studies by [employer]:  [id number], 9.5%,   PRIOR THERAPY: SRS to 3 brain lesion under the care of Dr. Lisbeth Renshaw.  Scheduled for April 20, 2020  CURRENT THERAPY: Systemic chemotherapy with carboplatin for AUC of 5, Alimta 500 mg/M2 and Keytruda 200 mg IV every 3 weeks.  First dose April 24, 2020.  Status post 5 cycles.  Starting from cycle #5 the patient will be on maintenance treatment with Alimta and Keytruda every 3 weeks.  INTERVAL HISTORY: Nicolas Allen 65 y.o. male returns to the clinic today for follow-up visit.  The patient is feeling fine today with no concerning complaints.  He denied having any chest pain, shortness of breath, cough or hemoptysis.  He denied having any fever or chills.  He has no nausea, vomiting, diarrhea or constipation.  He has no headache or visual changes.  He tolerated the 1st cycle of his maintenance treatment with Alimta and Keytruda fairly well.  He is here today for evaluation before starting cycle #6.   MEDICAL HISTORY: Past Medical History:  Diagnosis Date  . Anemia 06/24/2012  . ANXIETY 02/03/2007  . Cancer ([place])   . Cervical disc disease 02/12/2012   S/p surgury jan 2013  . Erectile dysfunction 02/11/2011  . GERD (gastroesophageal reflux disease)   . GLUCOSE INTOLERANCE 01/04/2008  . HYPERLIPIDEMIA 02/03/2007  . HYPERTENSION 02/03/2007  . IBS (irritable bowel syndrome)   . Impaired glucose tolerance 02/11/2011  . Smoker 08/22/2014  . Substance abuse ([place]) 2001   sober for 16  yrs    ALLERGIES:  has No Known Allergies.  MEDICATIONS:  Current Outpatient Medications  Medication Sig Dispense Refill  . amLODipine-benazepril (LOTREL) 10-20 MG capsule TAKE 1 CAPSULE BY MOUTH ONCE DAILY 90 capsule 3  . aspirin EC 81 MG tablet Take 81 mg by mouth daily. Swallow whole.    . b complex vitamins tablet Take 1 tablet by mouth daily.    Marland Kitchen dexamethasone (DECADRON) 4 MG tablet Take 1 tablet (4 mg total) by mouth 3 (three) times daily. 90 tablet 0  . folic acid (FOLVITE) 1 MG tablet Take 1 tablet (1 mg total) by mouth daily. 30 tablet 4  . Garlic 3557 MG CAPS Take 1,000 mg by mouth daily.     Marland Kitchen ibuprofen (ADVIL) 200 MG tablet Take 200 mg by mouth every 6 (six) hours as needed.    . lidocaine-prilocaine (EMLA) cream Apply to the Port-A-Cath site 30-60 minutes before chemotherapy. 30 g 0  . Omega-3 Fatty Acids (FISH OIL PO) Take 1 capsule by mouth daily.     . pravastatin (PRAVACHOL) 40 MG tablet TAKE 1 TABLET BY MOUTH ONCE DAILY 90 tablet 3  . prochlorperazine (COMPAZINE) 10 MG tablet Take 1 tablet (10 mg total) by mouth every 6 (six) hours as needed for nausea or vomiting. 30 tablet 0  . vardenafil (LEVITRA) 20 MG tablet TAKE 1 TABLET BY MOUTH AS NEEDED FOR  ERECTILE  DYSFUNCTION 10 tablet 5  . Vitamin D, Ergocalciferol, (DRISDOL) 1.25 MG (50000 UNIT) CAPS  capsule Take 1 capsule (50,000 Units total) by mouth every 7 (seven) days. (Patient taking differently: Take 50,000 Units by mouth every Sunday.) 12 capsule 0   No current facility-administered medications for this visit.    SURGICAL HISTORY:  Past Surgical History:  Procedure Laterality Date  . BRONCHIAL BIOPSY  03/30/2020   Procedure: BRONCHIAL BIOPSIES;  Surgeon: Garner Nash, DO;  Location: [place] [facility];  Service: Pulmonary;;  . BRONCHIAL BRUSHINGS  03/30/2020   Procedure: BRONCHIAL BRUSHINGS;  Surgeon: Garner Nash, DO;  Location: [place] [facility];  Service: Pulmonary;;  . BRONCHIAL NEEDLE ASPIRATION BIOPSY   03/30/2020   Procedure: BRONCHIAL NEEDLE ASPIRATION BIOPSIES;  Surgeon: Garner Nash, DO;  Location: [place] [facility];  Service: Pulmonary;;  . BRONCHIAL WASHINGS  03/30/2020   Procedure: BRONCHIAL WASHINGS;  Surgeon: Garner Nash, DO;  Location: [place];  Service: Pulmonary;;  . COLONOSCOPY  2007  . IR IMAGING GUIDED PORT INSERTION  04/23/2020  . neck fusion  2012   C4  . NO PAST SURGERIES    . VIDEO BRONCHOSCOPY WITH ENDOBRONCHIAL NAVIGATION N/A 03/30/2020   Procedure: VIDEO BRONCHOSCOPY WITH ENDOBRONCHIAL NAVIGATION;  Surgeon: Garner Nash, DO;  Location: [facility];  Service: Pulmonary;  Laterality: N/A;  . VIDEO BRONCHOSCOPY WITH ENDOBRONCHIAL ULTRASOUND N/A 03/30/2020   Procedure: VIDEO BRONCHOSCOPY WITH ENDOBRONCHIAL ULTRASOUND;  Surgeon: Garner Nash, DO;  Location: [facility];  Service: Pulmonary;  Laterality: N/A;    REVIEW OF SYSTEMS:  A comprehensive review of systems was negative.   PHYSICAL EXAMINATION: General appearance: alert, cooperative and no distress Head: Normocephalic, without obvious abnormality, atraumatic Neck: no adenopathy, no JVD, supple, symmetrical, trachea midline and thyroid not enlarged, symmetric, no tenderness/mass/nodules Lymph nodes: Cervical, supraclavicular, and axillary nodes normal. Resp: clear to auscultation bilaterally Back: symmetric, no curvature. ROM normal. No CVA tenderness. Cardio: regular rate and rhythm, S1, S2 normal, no murmur, click, rub or gallop GI: soft, non-tender; bowel sounds normal; no masses,  no organomegaly Extremities: extremities normal, atraumatic, no cyanosis or edema  ECOG PERFORMANCE STATUS: 1 - Symptomatic but completely ambulatory  Blood pressure 118/72, pulse 75, temperature (!) 97 F (36.1 C), temperature source Tympanic, resp. rate 17, height 6' (1.829 m), weight 161 lb 12.8 oz (73.4 kg), SpO2 100 %.  LABORATORY DATA: Lab Results  Component Value Date   WBC 5.5 08/07/2020   HGB 9.9 (L)  08/07/2020   HCT 29.5 (L) 08/07/2020   MCV 87.8 08/07/2020   PLT 218 08/07/2020      Chemistry      Component Value Date/Time   NA 139 08/07/2020 0752   K 3.6 08/07/2020 0752   CL 106 08/07/2020 0752   CO2 24 08/07/2020 0752   BUN 13 08/07/2020 0752   CREATININE 0.99 08/07/2020 0752   CREATININE 0.88 02/21/2020 1115      Component Value Date/Time   CALCIUM 8.8 (L) 08/07/2020 0752   ALKPHOS 47 08/07/2020 0752   AST 42 (H) 08/07/2020 0752   ALT 43 08/07/2020 0752   BILITOT 0.3 08/07/2020 0752       RADIOGRAPHIC STUDIES: MR Brain W Wo Contrast  Result Date: 07/24/2020 CLINICAL DATA:  Brain metastasis.  Surveillance. EXAM: MRI HEAD WITHOUT AND WITH CONTRAST TECHNIQUE: Multiplanar, multiecho pulse sequences of the brain and surrounding structures were obtained without and with intravenous contrast. CONTRAST:  41mL MULTIHANCE GADOBENATE DIMEGLUMINE 529 MG/ML IV SOLN COMPARISON:  MRI of the brain April 12, 2020. FINDINGS: Brain: No acute infarction, hemorrhage, hydrocephalus or extra-axial collection.  Scattered foci of T2 hyperintensity within the white matter of the cerebral hemispheres, nonspecific, not significantly changed from prior MRI. Interval decreasing size of the 3 metastatic lesions the described on prior MRI, annotated on series 11: Left parietal lobe, 1-2 mm (4 mm on prior), image 122; Cerebellar vermis, 5 mm (9 mm on prior), image 50; Right cerebellar hemisphere, 1 mm (4 mm on prior), image 48. No new lesion identified. Vascular: Normal flow voids. Skull and upper cervical spine: Normal marrow signal. Sinuses/Orbits: Mucous retention cyst in the right maxillary sinus. The orbits are maintained. Other: 2.4 cm enhancing right parotid lesion. IMPRESSION: 1. Evidence of treatment response with interval decreasing size of the 3 metastatic lesions the brain. No new lesion identified. 2. Stable 2.4 cm enhancing right parotid lesion. Electronically Signed   By: Pedro Earls M.D.   On: 07/24/2020 13:53    ASSESSMENT AND PLAN: This is a very pleasant 65 years old African-American male recently diagnosed with a stage IV (T3, N2, M1c)  non-small cell lung cancer, adenocarcinoma presented with right upper lobe lung mass in addition to right lower lobe, left lower lobe in addition to right hilar and mediastinal lymphadenopathy in addition to metastatic brain lesions diagnosed in October 2021. The patient has molecular studies by [employer] that showed no actionable mutations. He underwent SRS treatment to his brain lesion. The patient is currently undergoing systemic chemotherapy with carboplatin for AUC of 5, Alimta 500 mg/M2 and Keytruda 200 mg IV every 3 weeks status post 5 cycles.  Starting from cycle #5 the patient will be on maintenance treatment with Alimta and Keytruda every 3 weeks. The patient continues to tolerate his treatment fairly well with no concerning adverse effects. I recommended for him to proceed with cycle #6 today as planned. I will see him back for follow-up visit in 3 weeks for evaluation with repeat CT scan of the chest, abdomen pelvis for restaging of his disease. He was advised to call immediately if he has any concerning symptoms in the interval. The patient voices understanding of current disease status and treatment options and is in agreement with the current care plan.  All questions were answered. The patient knows to call the clinic with any problems, questions or concerns. We can certainly see the patient much sooner if necessary.  Disclaimer: This note was dictated with voice recognition software. Similar sounding words can inadvertently be transcribed and may not be corrected upon review.

## 2020-08-07 NOTE — Patient Instructions (Signed)
[facility] Discharge Instructions for Patients Receiving Chemotherapy  Today you received the following chemotherapy agents Pembrolizumab(Keutruda), Pemetrexed  To help prevent nausea and vomiting after your treatment, we encourage you to take your nausea medication as directed,   If you develop nausea and vomiting that is not controlled by your nausea medication, call the clinic.   BELOW ARE SYMPTOMS THAT SHOULD BE REPORTED IMMEDIATELY:  *FEVER GREATER THAN 100.5 F  *CHILLS WITH OR WITHOUT FEVER  NAUSEA AND VOMITING THAT IS NOT CONTROLLED WITH YOUR NAUSEA MEDICATION  *UNUSUAL SHORTNESS OF BREATH  *UNUSUAL BRUISING OR BLEEDING  TENDERNESS IN MOUTH AND THROAT WITH OR WITHOUT PRESENCE OF ULCERS  *URINARY PROBLEMS  *BOWEL PROBLEMS  UNUSUAL RASH Items with * indicate a potential emergency and should be followed up as soon as possible.  Feel free to call the clinic should you have any questions or concerns. The clinic phone number is [phone].  Please show the [facility] at check-in to the Emergency Department and triage nurse.

## 2020-08-22 ENCOUNTER — Other Ambulatory Visit (INDEPENDENT_AMBULATORY_CARE_PROVIDER_SITE_OTHER): Payer: 59

## 2020-08-22 DIAGNOSIS — I10 Essential (primary) hypertension: Secondary | ICD-10-CM | POA: Diagnosis not present

## 2020-08-22 DIAGNOSIS — E559 Vitamin D deficiency, unspecified: Secondary | ICD-10-CM

## 2020-08-22 DIAGNOSIS — Z0000 Encounter for general adult medical examination without abnormal findings: Secondary | ICD-10-CM | POA: Diagnosis not present

## 2020-08-22 DIAGNOSIS — E538 Deficiency of other specified B group vitamins: Secondary | ICD-10-CM | POA: Diagnosis not present

## 2020-08-22 DIAGNOSIS — R7302 Impaired glucose tolerance (oral): Secondary | ICD-10-CM | POA: Diagnosis not present

## 2020-08-22 LAB — URINALYSIS, ROUTINE W REFLEX MICROSCOPIC
Bilirubin Urine: NEGATIVE
Hgb urine dipstick: NEGATIVE
Ketones, ur: NEGATIVE
Leukocytes,Ua: NEGATIVE
Nitrite: NEGATIVE
RBC / HPF: NONE SEEN (ref 0–?)
Specific Gravity, Urine: 1.025 (ref 1.000–1.030)
Total Protein, Urine: NEGATIVE
Urine Glucose: NEGATIVE
Urobilinogen, UA: 0.2 (ref 0.0–1.0)
pH: 5.5 (ref 5.0–8.0)

## 2020-08-22 LAB — BASIC METABOLIC PANEL
BUN: 15 mg/dL (ref 6–23)
CO2: 29 mEq/L (ref 19–32)
Calcium: 9.3 mg/dL (ref 8.4–10.5)
Chloride: 102 mEq/L (ref 96–112)
Creatinine, Ser: 1.18 mg/dL (ref 0.40–1.50)
GFR: 65.68 mL/min (ref >60.00)
Glucose, Bld: 131 mg/dL — ABNORMAL HIGH (ref 70–99)
Potassium: 4.1 mEq/L (ref 3.5–5.1)
Sodium: 137 mEq/L (ref 135–145)

## 2020-08-22 LAB — CBC WITH DIFFERENTIAL/PLATELET
Basophils Absolute: 0 10*3/uL (ref 0.0–0.1)
Basophils Relative: 0.7 % (ref 0.0–3.0)
Eosinophils Absolute: 0.2 10*3/uL (ref 0.0–0.7)
Eosinophils Relative: 3.5 % (ref 0.0–5.0)
HCT: 33.2 % — ABNORMAL LOW (ref 39.0–52.0)
Hemoglobin: 11.1 g/dL — ABNORMAL LOW (ref 13.0–17.0)
Lymphocytes Relative: 32.6 % (ref 12.0–46.0)
Lymphs Abs: 1.9 10*3/uL (ref 0.7–4.0)
MCHC: 33.4 g/dL (ref 30.0–36.0)
MCV: 90.5 fl (ref 78.0–100.0)
Monocytes Absolute: 1 10*3/uL (ref 0.1–1.0)
Monocytes Relative: 17.3 % — ABNORMAL HIGH (ref 3.0–12.0)
Neutro Abs: 2.6 10*3/uL (ref 1.4–7.7)
Neutrophils Relative %: 45.9 % (ref 43.0–77.0)
Platelets: 135 10*3/uL — ABNORMAL LOW (ref 150.0–400.0)
RBC: 3.67 Mil/uL — ABNORMAL LOW (ref 4.22–5.81)
RDW: 17.1 % — ABNORMAL HIGH (ref 11.5–15.5)
WBC: 5.8 10*3/uL (ref 4.0–10.5)

## 2020-08-22 LAB — HEPATIC FUNCTION PANEL
ALT: 83 U/L — ABNORMAL HIGH (ref 0–53)
AST: 60 U/L — ABNORMAL HIGH (ref 0–37)
Albumin: 4.1 g/dL (ref 3.5–5.2)
Alkaline Phosphatase: 46 U/L (ref 39–117)
Bilirubin, Direct: 0.1 mg/dL (ref 0.0–0.3)
Total Bilirubin: 0.4 mg/dL (ref 0.2–1.2)
Total Protein: 7.1 g/dL (ref 6.0–8.3)

## 2020-08-22 LAB — LIPID PANEL
Cholesterol: 199 mg/dL (ref 0–200)
HDL: 66.4 mg/dL (ref >39.00)
LDL Cholesterol: 113 mg/dL — ABNORMAL HIGH (ref 0–99)
NonHDL: 132.84
Total CHOL/HDL Ratio: 3
Triglycerides: 98 mg/dL (ref 0.0–149.0)
VLDL: 19.6 mg/dL (ref 0.0–40.0)

## 2020-08-22 LAB — HEMOGLOBIN A1C: Hgb A1c MFr Bld: 5.6 % (ref 4.6–6.5)

## 2020-08-23 ENCOUNTER — Ambulatory Visit: Payer: 59 | Admitting: Internal Medicine

## 2020-08-23 LAB — VITAMIN B12: Vitamin B-12: 1119 pg/mL — ABNORMAL HIGH (ref 211–911)

## 2020-08-23 LAB — PSA: PSA: 0.39 ng/mL (ref 0.10–4.00)

## 2020-08-23 LAB — TSH: TSH: 1.41 u[IU]/mL (ref 0.35–4.50)

## 2020-08-23 LAB — VITAMIN D 25 HYDROXY (VIT D DEFICIENCY, FRACTURES): VITD: 23.45 ng/mL — ABNORMAL LOW (ref 30.00–100.00)

## 2020-08-27 ENCOUNTER — Ambulatory Visit: Payer: 59 | Admitting: Internal Medicine

## 2020-08-27 ENCOUNTER — Other Ambulatory Visit: Payer: Self-pay | Admitting: Internal Medicine

## 2020-08-27 ENCOUNTER — Encounter (HOSPITAL_BASED_OUTPATIENT_CLINIC_OR_DEPARTMENT_OTHER): Payer: Self-pay

## 2020-08-27 ENCOUNTER — Other Ambulatory Visit: Payer: Self-pay

## 2020-08-27 ENCOUNTER — Ambulatory Visit (HOSPITAL_BASED_OUTPATIENT_CLINIC_OR_DEPARTMENT_OTHER)
Admission: RE | Admit: 2020-08-27 | Discharge: 2020-08-27 | Disposition: A | Discharge status: Home / Self Care | Payer: 59 | Source: Ambulatory Visit | Attending: Internal Medicine | Admitting: Internal Medicine

## 2020-08-27 ENCOUNTER — Encounter: Payer: Self-pay | Admitting: Internal Medicine

## 2020-08-27 VITALS — BP 136/62 | HR 67 | Ht 72.0 in | Wt 164.0 lb

## 2020-08-27 DIAGNOSIS — J439 Emphysema, unspecified: Secondary | ICD-10-CM | POA: Insufficient documentation

## 2020-08-27 DIAGNOSIS — Z0000 Encounter for general adult medical examination without abnormal findings: Secondary | ICD-10-CM | POA: Diagnosis not present

## 2020-08-27 DIAGNOSIS — Z5181 Encounter for therapeutic drug level monitoring: Secondary | ICD-10-CM | POA: Diagnosis not present

## 2020-08-27 DIAGNOSIS — R7302 Impaired glucose tolerance (oral): Secondary | ICD-10-CM

## 2020-08-27 DIAGNOSIS — E7800 Pure hypercholesterolemia, unspecified: Secondary | ICD-10-CM

## 2020-08-27 DIAGNOSIS — R945 Abnormal results of liver function studies: Secondary | ICD-10-CM | POA: Diagnosis not present

## 2020-08-27 DIAGNOSIS — I700 Atherosclerosis of aorta: Secondary | ICD-10-CM | POA: Diagnosis not present

## 2020-08-27 DIAGNOSIS — C3490 Malignant neoplasm of unspecified part of unspecified bronchus or lung: Secondary | ICD-10-CM | POA: Diagnosis present

## 2020-08-27 DIAGNOSIS — E559 Vitamin D deficiency, unspecified: Secondary | ICD-10-CM

## 2020-08-27 DIAGNOSIS — Z79899 Other long term (current) drug therapy: Secondary | ICD-10-CM | POA: Insufficient documentation

## 2020-08-27 DIAGNOSIS — R7989 Other specified abnormal findings of blood chemistry: Secondary | ICD-10-CM

## 2020-08-27 DIAGNOSIS — Z0001 Encounter for general adult medical examination with abnormal findings: Secondary | ICD-10-CM

## 2020-08-27 DIAGNOSIS — C7931 Secondary malignant neoplasm of brain: Secondary | ICD-10-CM | POA: Diagnosis not present

## 2020-08-27 MED ORDER — IOHEXOL 300 MG/ML  SOLN
80.0000 mL | Freq: Once | INTRAMUSCULAR | Status: AC | PRN
  Administered 2020-08-27: 80 mL via INTRAVENOUS
  Filled 2020-08-27: qty 80

## 2020-08-27 NOTE — Assessment & Plan Note (Signed)
For lower chol det, declines statin

## 2020-08-27 NOTE — Assessment & Plan Note (Signed)
Last vitamin D Lab Results  Component Value Date   VD25OH 23.45 (L) 08/22/2020   Low, to start d3 2000 u oral replacement

## 2020-08-27 NOTE — Assessment & Plan Note (Signed)
Lab Results  Component Value Date   LDLCALC 113 (H) 08/22/2020   Mild worsening, pt to continue diet, declines statin  Current Outpatient Medications (Endocrine & Metabolic):  .  dexamethasone (DECADRON) 4 MG tablet, Take 1 tablet (4 mg total) by mouth 3 (three) times daily. (Patient not taking: Reported on 08/27/2020)  Current Outpatient Medications (Cardiovascular):  .  amLODipine-benazepril (LOTREL) 10-20 MG capsule, TAKE 1 CAPSULE BY MOUTH ONCE DAILY .  pravastatin (PRAVACHOL) 40 MG tablet, TAKE 1 TABLET BY MOUTH ONCE DAILY .  vardenafil (LEVITRA) 20 MG tablet, TAKE 1 TABLET BY MOUTH AS NEEDED FOR  ERECTILE  DYSFUNCTION   Current Outpatient Medications (Analgesics):  .  aspirin EC 81 MG tablet, Take 81 mg by mouth daily. Swallow whole. .  ibuprofen (ADVIL) 200 MG tablet, Take 200 mg by mouth every 6 (six) hours as needed.  Current Outpatient Medications (Hematological):  .  folic acid (FOLVITE) 1 MG tablet, Take 1 tablet (1 mg total) by mouth daily.  Current Outpatient Medications (Other):  .  b complex vitamins tablet, Take 1 tablet by mouth daily. .  Garlic 1025 MG CAPS, Take 1,000 mg by mouth daily.  Marland Kitchen  lidocaine-prilocaine (EMLA) cream, Apply to the Port-A-Cath site 30-60 minutes before chemotherapy. .  Omega-3 Fatty Acids (FISH OIL PO), Take 1 capsule by mouth daily.  .  prochlorperazine (COMPAZINE) 10 MG tablet, Take 1 tablet (10 mg total) by mouth every 6 (six) hours as needed for nausea or vomiting. .  Vitamin D, Ergocalciferol, (DRISDOL) 1.25 MG (50000 UNIT) CAPS capsule, Take 1 capsule (50,000 Units total) by mouth every 7 (seven) days. (Patient not taking: Reported on 08/27/2020)

## 2020-08-27 NOTE — Patient Instructions (Addendum)
Please take OTC Vitamin D3 at 2000 units per day, indefinitely.  Please continue all other medications as before, and refills have been done if requested.  Please have the pharmacy call with any other refills you may need.  Please continue your efforts at being more active, low cholesterol diet, and weight control.  You are otherwise up to date with prevention measures today.  Please keep your appointments with your specialists as you may have planned, including the CT scans later today  Please make an Appointment to return in 6 months, or sooner if needed, also with Lab Appointment for testing done 3-5 days before at the [facility] (so this is for TWO appointments - please see the scheduling desk as you leave)  Due to the ongoing Covid 19 pandemic, our lab now requires an appointment for any labs done at our office.  If you need labs done and do not have an appointment, please call our office ahead of time to schedule before presenting to the lab for your testing.

## 2020-08-27 NOTE — Progress Notes (Signed)
Patient ID: Nicolas Allen, male   DOB: April 03, 1956, 65 y.o.   MRN: [id number]         Chief Complaint:: wellness exam and Follow-up  former smoker, low vit d, hld, elevated lft's       HPI:  Nicolas Allen is a 65 y.o. male here for wellness exam; quit smoking sept 2021, keeps gaining wt but plans to watch diet better., ow up to date with preventve referrals and immunizations.                         Also Denies worsening reflux, abd pain, dysphagia, n/v, bowel change or blood.  Has not restarted smoking since sept 2021 cancer.  Not taking vit d.  Has been eating more since encouraged by oncology and quit smoking.  Pt denies chest pain, increased sob or doe, wheezing, orthopnea, PND, increased LE swelling, palpitations, dizziness or syncope.   Pt denies polydipsia, polyuria,  Pt denies fever, wt loss, night sweats, loss of appetite, or other constitutional symptoms   Denies new focal neuro s/s   Wt Readings from Last 3 Encounters:  08/27/20 164 lb (74.4 kg)  08/07/20 161 lb 12.8 oz (73.4 kg)  07/17/20 161 lb 8 oz (73.3 kg)   BP Readings from Last 3 Encounters:  08/27/20 136/62  08/07/20 118/72  07/17/20 (!) 110/58   Immunization History  Administered Date(s) Administered  . Influenza,inj,Quad PF,6+ Mos 06/30/2018, 02/24/2020  . PFIZER(Purple Top)SARS-COV-2 Vaccination 09/02/2019, 09/27/2019, 04/09/2020  . Td 12/13/2008  . Tdap 02/23/2019  There are no preventive care reminders to display for this patient.    Past Medical History:  Diagnosis Date  . Anemia 06/24/2012  . ANXIETY 02/03/2007  . Cancer ([place])   . Cervical disc disease 02/12/2012   S/p surgury jan 2013  . Erectile dysfunction 02/11/2011  . GERD (gastroesophageal reflux disease)   . GLUCOSE INTOLERANCE 01/04/2008  . HYPERLIPIDEMIA 02/03/2007  . HYPERTENSION 02/03/2007  . IBS (irritable bowel syndrome)   . Impaired glucose tolerance 02/11/2011  . Smoker 08/22/2014  . Substance abuse ([place]) 2001   sober for 16 yrs   Past  Surgical History:  Procedure Laterality Date  . BRONCHIAL BIOPSY  03/30/2020   Procedure: BRONCHIAL BIOPSIES;  Surgeon: Garner Nash, DO;  Location: [facility];  Service: Pulmonary;;  . BRONCHIAL BRUSHINGS  03/30/2020   Procedure: BRONCHIAL BRUSHINGS;  Surgeon: Garner Nash, DO;  Location: [facility];  Service: Pulmonary;;  . BRONCHIAL NEEDLE ASPIRATION BIOPSY  03/30/2020   Procedure: BRONCHIAL NEEDLE ASPIRATION BIOPSIES;  Surgeon: Garner Nash, DO;  Location: [facility];  Service: Pulmonary;;  . BRONCHIAL WASHINGS  03/30/2020   Procedure: BRONCHIAL WASHINGS;  Surgeon: Garner Nash, DO;  Location: [facility];  Service: Pulmonary;;  . COLONOSCOPY  2007  . IR IMAGING GUIDED PORT INSERTION  04/23/2020  . neck fusion  2012   C4  . NO PAST SURGERIES    . VIDEO BRONCHOSCOPY WITH ENDOBRONCHIAL NAVIGATION N/A 03/30/2020   Procedure: VIDEO BRONCHOSCOPY WITH ENDOBRONCHIAL NAVIGATION;  Surgeon: Garner Nash, DO;  Location: [facility];  Service: Pulmonary;  Laterality: N/A;  . VIDEO BRONCHOSCOPY WITH ENDOBRONCHIAL ULTRASOUND N/A 03/30/2020   Procedure: VIDEO BRONCHOSCOPY WITH ENDOBRONCHIAL ULTRASOUND;  Surgeon: Garner Nash, DO;  Location: [facility];  Service: Pulmonary;  Laterality: N/A;    reports that he quit smoking about 6 months ago. His smoking use included cigarettes. He has a 45.00 pack-year smoking history. He  has quit using smokeless tobacco.  His smokeless tobacco use included chew. He reports that he does not drink alcohol and does not use drugs. family history includes Cancer in his cousin and mother; Hypertension in an other family member; Stroke in an other family member. No Known Allergies Current Outpatient Medications on File Prior to Visit  Medication Sig Dispense Refill  . amLODipine-benazepril (LOTREL) 10-20 MG capsule TAKE 1 CAPSULE BY MOUTH ONCE DAILY 90 capsule 3  . aspirin EC 81 MG tablet Take 81 mg by mouth daily. Swallow whole.    . b  complex vitamins tablet Take 1 tablet by mouth daily.    . folic acid (FOLVITE) 1 MG tablet Take 1 tablet (1 mg total) by mouth daily. 30 tablet 4  . Garlic 4098 MG CAPS Take 1,000 mg by mouth daily.     Marland Kitchen ibuprofen (ADVIL) 200 MG tablet Take 200 mg by mouth every 6 (six) hours as needed.    . lidocaine-prilocaine (EMLA) cream Apply to the Port-A-Cath site 30-60 minutes before chemotherapy. 30 g 0  . Omega-3 Fatty Acids (FISH OIL PO) Take 1 capsule by mouth daily.     . pravastatin (PRAVACHOL) 40 MG tablet TAKE 1 TABLET BY MOUTH ONCE DAILY 90 tablet 3  . prochlorperazine (COMPAZINE) 10 MG tablet Take 1 tablet (10 mg total) by mouth every 6 (six) hours as needed for nausea or vomiting. 30 tablet 0  . vardenafil (LEVITRA) 20 MG tablet TAKE 1 TABLET BY MOUTH AS NEEDED FOR  ERECTILE  DYSFUNCTION 10 tablet 5  . dexamethasone (DECADRON) 4 MG tablet Take 1 tablet (4 mg total) by mouth 3 (three) times daily. (Patient not taking: Reported on 08/27/2020) 90 tablet 0  . Vitamin D, Ergocalciferol, (DRISDOL) 1.25 MG (50000 UNIT) CAPS capsule Take 1 capsule (50,000 Units total) by mouth every 7 (seven) days. (Patient not taking: Reported on 08/27/2020) 12 capsule 0   No current facility-administered medications on file prior to visit.        ROS:  All others reviewed and negative.  Objective        PE:  BP 136/62   Pulse 67   Ht 6' (1.829 m)   Wt 164 lb (74.4 kg)   SpO2 99%   BMI 22.24 kg/m                 Constitutional: Pt appears in NAD               HENT: Head: NCAT.                Right Ear: External ear normal.                 Left Ear: External ear normal.                Eyes: . Pupils are equal, round, and reactive to light. Conjunctivae and EOM are normal               Nose: without d/c or deformity               Neck: Neck supple. Gross normal ROM               Cardiovascular: Normal rate and regular rhythm.                 Pulmonary/Chest: Effort normal and breath sounds without rales or  wheezing.  Abd:  Soft, NT, ND, + BS, no organomegaly               Neurological: Pt is alert. At baseline orientation, motor grossly intact               Skin: Skin is warm. No rashes, no other new lesions, LE edema - none               Psychiatric: Pt behavior is normal without agitation   Micro: none  Cardiac tracings I have personally interpreted today:  none  Pertinent Radiological findings (summarize): none   Lab Results  Component Value Date   WBC 5.8 08/22/2020   HGB 11.1 (L) 08/22/2020   HCT 33.2 (L) 08/22/2020   PLT 135.0 (L) 08/22/2020   GLUCOSE 131 (H) 08/22/2020   CHOL 199 08/22/2020   TRIG 98.0 08/22/2020   HDL 66.40 08/22/2020   LDLCALC 113 (H) 08/22/2020   ALT 83 (H) 08/22/2020   AST 60 (H) 08/22/2020   NA 137 08/22/2020   K 4.1 08/22/2020   CL 102 08/22/2020   CREATININE 1.18 08/22/2020   BUN 15 08/22/2020   CO2 29 08/22/2020   TSH 1.41 08/22/2020   PSA 0.39 08/22/2020   INR 1.0 03/30/2020   HGBA1C 5.6 08/22/2020   Assessment/Plan:  Nicolas Allen is a 65 y.o. Black or African American [2] male with  has a past medical history of Anemia (06/24/2012), ANXIETY (02/03/2007), Cancer ([place]), Cervical disc disease (02/12/2012), Erectile dysfunction (02/11/2011), GERD (gastroesophageal reflux disease), GLUCOSE INTOLERANCE (01/04/2008), HYPERLIPIDEMIA (02/03/2007), HYPERTENSION (02/03/2007), IBS (irritable bowel syndrome), Impaired glucose tolerance (02/11/2011), Smoker (08/22/2014), and Substance abuse ([place]) (2001).  Encounter for well adult exam with abnormal findings Age and sex appropriate education and counseling updated with regular exercise and diet Referrals for preventative services - none needed Immunizations addressed - none needed Smoking counseling  - none needed Evidence for depression or other mood disorder - none significant Most recent labs reviewed. I have personally reviewed and have noted: 1) the patient's medical and social history 2)  The patient's current medications and supplements 3) The patient's height, weight, and BMI have been recorded in the chart   Aortic atherosclerosis ([place]) For lower chol det, declines statin  Vitamin D deficiency Last vitamin D Lab Results  Component Value Date   VD25OH 23.45 (L) 08/22/2020   Low, to start d3 2000 u oral replacement  Impaired glucose tolerance Lab Results  Component Value Date   HGBA1C 5.6 08/22/2020   Stable, pt to continue current medical treatment  - diet and wt control   Hyperlipidemia Lab Results  Component Value Date   LDLCALC 113 (H) 08/22/2020   Mild worsening, pt to continue diet, declines statin  Current Outpatient Medications (Endocrine & Metabolic):  .  dexamethasone (DECADRON) 4 MG tablet, Take 1 tablet (4 mg total) by mouth 3 (three) times daily. (Patient not taking: Reported on 08/27/2020)  Current Outpatient Medications (Cardiovascular):  .  amLODipine-benazepril (LOTREL) 10-20 MG capsule, TAKE 1 CAPSULE BY MOUTH ONCE DAILY .  pravastatin (PRAVACHOL) 40 MG tablet, TAKE 1 TABLET BY MOUTH ONCE DAILY .  vardenafil (LEVITRA) 20 MG tablet, TAKE 1 TABLET BY MOUTH AS NEEDED FOR  ERECTILE  DYSFUNCTION   Current Outpatient Medications (Analgesics):  .  aspirin EC 81 MG tablet, Take 81 mg by mouth daily. Swallow whole. .  ibuprofen (ADVIL) 200 MG tablet, Take 200 mg by mouth every 6 (six) hours as needed.  Current Outpatient Medications (  Hematological):  .  folic acid (FOLVITE) 1 MG tablet, Take 1 tablet (1 mg total) by mouth daily.  Current Outpatient Medications (Other):  .  b complex vitamins tablet, Take 1 tablet by mouth daily. .  Garlic 9924 MG CAPS, Take 1,000 mg by mouth daily.  Marland Kitchen  lidocaine-prilocaine (EMLA) cream, Apply to the Port-A-Cath site 30-60 minutes before chemotherapy. .  Omega-3 Fatty Acids (FISH OIL PO), Take 1 capsule by mouth daily.  .  prochlorperazine (COMPAZINE) 10 MG tablet, Take 1 tablet (10 mg total) by mouth every 6  (six) hours as needed for nausea or vomiting. .  Vitamin D, Ergocalciferol, (DRISDOL) 1.25 MG (50000 UNIT) CAPS capsule, Take 1 capsule (50,000 Units total) by mouth every 7 (seven) days. (Patient not taking: Reported on 08/27/2020)   Abnormal LFTs Etiology unclear, ? Malignancy related, mentioned to pt, has CT already scheduled for later today  Followup: Return in about 6 months (around 02/27/2021).  Cathlean Cower, MD 08/27/2020 10:38 PM [facility]

## 2020-08-27 NOTE — Assessment & Plan Note (Signed)

## 2020-08-27 NOTE — Assessment & Plan Note (Signed)
Lab Results  Component Value Date   HGBA1C 5.6 08/22/2020   Stable, pt to continue current medical treatment  - diet and wt control

## 2020-08-27 NOTE — Assessment & Plan Note (Signed)
Etiology unclear, ? Malignancy related, mentioned to pt, has CT already scheduled for later today

## 2020-08-28 ENCOUNTER — Inpatient Hospital Stay (HOSPITAL_BASED_OUTPATIENT_CLINIC_OR_DEPARTMENT_OTHER): Payer: 59 | Admitting: Internal Medicine

## 2020-08-28 ENCOUNTER — Inpatient Hospital Stay: Payer: 59

## 2020-08-28 ENCOUNTER — Other Ambulatory Visit: Payer: Self-pay

## 2020-08-28 ENCOUNTER — Inpatient Hospital Stay: Payer: 59 | Attending: Internal Medicine

## 2020-08-28 VITALS — BP 120/65 | HR 86 | Temp 96.9°F | Resp 19 | Ht 72.0 in | Wt 164.7 lb

## 2020-08-28 DIAGNOSIS — Z5112 Encounter for antineoplastic immunotherapy: Secondary | ICD-10-CM

## 2020-08-28 DIAGNOSIS — C3491 Malignant neoplasm of unspecified part of right bronchus or lung: Secondary | ICD-10-CM

## 2020-08-28 DIAGNOSIS — I10 Essential (primary) hypertension: Secondary | ICD-10-CM | POA: Diagnosis not present

## 2020-08-28 DIAGNOSIS — Z79899 Other long term (current) drug therapy: Secondary | ICD-10-CM | POA: Insufficient documentation

## 2020-08-28 DIAGNOSIS — C3411 Malignant neoplasm of upper lobe, right bronchus or lung: Secondary | ICD-10-CM | POA: Diagnosis present

## 2020-08-28 DIAGNOSIS — C3432 Malignant neoplasm of lower lobe, left bronchus or lung: Secondary | ICD-10-CM | POA: Diagnosis not present

## 2020-08-28 DIAGNOSIS — Z5111 Encounter for antineoplastic chemotherapy: Secondary | ICD-10-CM

## 2020-08-28 DIAGNOSIS — C3431 Malignant neoplasm of lower lobe, right bronchus or lung: Secondary | ICD-10-CM | POA: Diagnosis not present

## 2020-08-28 DIAGNOSIS — C3490 Malignant neoplasm of unspecified part of unspecified bronchus or lung: Secondary | ICD-10-CM

## 2020-08-28 DIAGNOSIS — Z95828 Presence of other vascular implants and grafts: Secondary | ICD-10-CM

## 2020-08-28 DIAGNOSIS — C7931 Secondary malignant neoplasm of brain: Secondary | ICD-10-CM

## 2020-08-28 DIAGNOSIS — Z87891 Personal history of nicotine dependence: Secondary | ICD-10-CM | POA: Insufficient documentation

## 2020-08-28 LAB — TSH: TSH: 1.74 u[IU]/mL (ref 0.320–4.118)

## 2020-08-28 LAB — CBC WITH DIFFERENTIAL (CANCER CENTER ONLY)
Abs Immature Granulocytes: 0.02 10*3/uL (ref 0.00–0.07)
Basophils Absolute: 0 10*3/uL (ref 0.0–0.1)
Basophils Relative: 0 %
Eosinophils Absolute: 0.2 10*3/uL (ref 0.0–0.5)
Eosinophils Relative: 5 %
HCT: 29.7 % — ABNORMAL LOW (ref 39.0–52.0)
Hemoglobin: 10.3 g/dL — ABNORMAL LOW (ref 13.0–17.0)
Immature Granulocytes: 0 %
Lymphocytes Relative: 30 %
Lymphs Abs: 1.5 10*3/uL (ref 0.7–4.0)
MCH: 30.4 pg (ref 26.0–34.0)
MCHC: 34.7 g/dL (ref 30.0–36.0)
MCV: 87.6 fL (ref 80.0–100.0)
Monocytes Absolute: 0.6 10*3/uL (ref 0.1–1.0)
Monocytes Relative: 12 %
Neutro Abs: 2.6 10*3/uL (ref 1.7–7.7)
Neutrophils Relative %: 53 %
Platelet Count: 164 10*3/uL (ref 150–400)
RBC: 3.39 MIL/uL — ABNORMAL LOW (ref 4.22–5.81)
RDW: 15.8 % — ABNORMAL HIGH (ref 11.5–15.5)
WBC Count: 4.9 10*3/uL (ref 4.0–10.5)
nRBC: 0 % (ref 0.0–0.2)

## 2020-08-28 LAB — CMP (CANCER CENTER ONLY)
ALT: 40 U/L (ref 0–44)
AST: 30 U/L (ref 15–41)
Albumin: 3.6 g/dL (ref 3.5–5.0)
Alkaline Phosphatase: 48 U/L (ref 38–126)
Anion gap: 8 (ref 5–15)
BUN: 13 mg/dL (ref 8–23)
CO2: 24 mmol/L (ref 22–32)
Calcium: 8.8 mg/dL — ABNORMAL LOW (ref 8.9–10.3)
Chloride: 106 mmol/L (ref 98–111)
Creatinine: 1.02 mg/dL (ref 0.61–1.24)
GFR, Estimated: >60 mL/min (ref >60)
Glucose, Bld: 167 mg/dL — ABNORMAL HIGH (ref 70–99)
Potassium: 3.6 mmol/L (ref 3.5–5.1)
Sodium: 138 mmol/L (ref 135–145)
Total Bilirubin: 0.5 mg/dL (ref 0.3–1.2)
Total Protein: 6.9 g/dL (ref 6.5–8.1)

## 2020-08-28 MED ORDER — PROCHLORPERAZINE MALEATE 10 MG PO TABS
10.0000 mg | ORAL_TABLET | Freq: Once | ORAL | Status: AC
  Administered 2020-08-28: 10 mg via ORAL

## 2020-08-28 MED ORDER — SODIUM CHLORIDE 0.9% FLUSH
10.0000 mL | Freq: Once | INTRAVENOUS | Status: AC
Start: 2020-08-28 — End: 2020-08-28
  Administered 2020-08-28: 10 mL
  Filled 2020-08-28: qty 10

## 2020-08-28 MED ORDER — SODIUM CHLORIDE 0.9 % IV SOLN
500.0000 mg/m2 | Freq: Once | INTRAVENOUS | Status: AC
  Administered 2020-08-28: 900 mg via INTRAVENOUS
  Filled 2020-08-28: qty 20

## 2020-08-28 MED ORDER — CYANOCOBALAMIN 1000 MCG/ML IJ SOLN: 1000.0000 ug | Freq: Once | INTRAMUSCULAR | Status: DC

## 2020-08-28 MED ORDER — PROCHLORPERAZINE MALEATE 10 MG PO TABS
ORAL_TABLET | ORAL | Status: AC
  Filled 2020-08-28: qty 1

## 2020-08-28 MED ORDER — CYANOCOBALAMIN 1000 MCG/ML IJ SOLN
INTRAMUSCULAR | Status: AC
  Filled 2020-08-28: qty 1

## 2020-08-28 MED ORDER — SODIUM CHLORIDE 0.9 % IV SOLN
Freq: Once | INTRAVENOUS | Status: AC
Start: 2020-08-28 — End: 2020-08-28
  Filled 2020-08-28: qty 250

## 2020-08-28 MED ORDER — SODIUM CHLORIDE 0.9% FLUSH
10.0000 mL | INTRAVENOUS | Status: DC | PRN
Start: 2020-08-28 — End: 2020-08-28
  Administered 2020-08-28: 10 mL
  Filled 2020-08-28: qty 10

## 2020-08-28 MED ORDER — SODIUM CHLORIDE 0.9 % IV SOLN
200.0000 mg | Freq: Once | INTRAVENOUS | Status: AC
  Administered 2020-08-28: 200 mg via INTRAVENOUS
  Filled 2020-08-28: qty 8

## 2020-08-28 MED ORDER — HEPARIN SOD (PORK) LOCK FLUSH 100 UNIT/ML IV SOLN
500.0000 [IU] | Freq: Once | INTRAVENOUS | Status: AC | PRN
  Administered 2020-08-28: 500 [IU]
  Filled 2020-08-28: qty 5

## 2020-08-28 NOTE — Patient Instructions (Signed)
[facility] Discharge Instructions for Patients Receiving Chemotherapy  Today you received the following chemotherapy agents keytruda, alimta  To help prevent nausea and vomiting after your treatment, we encourage you to take your nausea medication as directed.   If you develop nausea and vomiting that is not controlled by your nausea medication, call the clinic.   BELOW ARE SYMPTOMS THAT SHOULD BE REPORTED IMMEDIATELY:  *FEVER GREATER THAN 100.5 F  *CHILLS WITH OR WITHOUT FEVER  NAUSEA AND VOMITING THAT IS NOT CONTROLLED WITH YOUR NAUSEA MEDICATION  *UNUSUAL SHORTNESS OF BREATH  *UNUSUAL BRUISING OR BLEEDING  TENDERNESS IN MOUTH AND THROAT WITH OR WITHOUT PRESENCE OF ULCERS  *URINARY PROBLEMS  *BOWEL PROBLEMS  UNUSUAL RASH Items with * indicate a potential emergency and should be followed up as soon as possible.  Feel free to call the clinic should you have any questions or concerns. The clinic phone number is [phone].  Please show the [facility] at check-in to the Emergency Department and triage nurse.

## 2020-08-28 NOTE — Progress Notes (Signed)
[place] [phone]   [phone]  OFFICE PROGRESS NOTE  [name], MD [place] [id number]  DIAGNOSIS: Stage IV (T3, N2, M1c) non-small cell lung cancer favoring adenocarcinoma presented with right upper lobe lung mass in addition to right lower lobe, left lower lobe in addition to right hilar and mediastinal lymphadenopathy in addition to metastatic brain lesions diagnosed in October 2021.  Molecular studies by [employer]:  [id number], 9.5%,   PRIOR THERAPY: SRS to 3 brain lesion under the care of Dr. Lisbeth Renshaw.  Scheduled for April 20, 2020  CURRENT THERAPY: Systemic chemotherapy with carboplatin for AUC of 5, Alimta 500 mg/M2 and Keytruda 200 mg IV every 3 weeks.  First dose April 24, 2020.  Status post 6 cycles.  Starting from cycle #5 the patient will be on maintenance treatment with Alimta and Keytruda every 3 weeks.  INTERVAL HISTORY: Nicolas Allen 65 y.o. male returns to the clinic today for follow-up visit accompanied by his wife.  The patient is feeling fine today with no concerning complaints.  He denied having any current chest pain, shortness of breath, cough or hemoptysis.  He has no nausea, vomiting, diarrhea or constipation.  He has no headache or visual changes.  He gained 3 pounds since his last visit.  He denied having any fever or chills.  He continues to tolerate his treatment with maintenance Alimta and Keytruda fairly well.  The patient has repeat CT scan of the chest, abdomen pelvis performed recently and he is here for evaluation and discussion of his scan results.   MEDICAL HISTORY: Past Medical History:  Diagnosis Date  . Anemia 06/24/2012  . ANXIETY 02/03/2007  . Cancer ([place])   . Cervical disc disease 02/12/2012   S/p surgury jan 2013  . Erectile dysfunction 02/11/2011  . GERD (gastroesophageal reflux disease)   . GLUCOSE INTOLERANCE 01/04/2008  . HYPERLIPIDEMIA 02/03/2007  . HYPERTENSION  02/03/2007  . IBS (irritable bowel syndrome)   . Impaired glucose tolerance 02/11/2011  . Smoker 08/22/2014  . Substance abuse ([place]) 2001   sober for 16 yrs    ALLERGIES:  has No Known Allergies.  MEDICATIONS:  Current Outpatient Medications  Medication Sig Dispense Refill  . amLODipine-benazepril (LOTREL) 10-20 MG capsule TAKE 1 CAPSULE BY MOUTH ONCE DAILY 90 capsule 3  . aspirin EC 81 MG tablet Take 81 mg by mouth daily. Swallow whole.    . b complex vitamins tablet Take 1 tablet by mouth daily.    . folic acid (FOLVITE) 1 MG tablet Take 1 tablet (1 mg total) by mouth daily. 30 tablet 4  . Garlic 5597 MG CAPS Take 1,000 mg by mouth daily.     Marland Kitchen ibuprofen (ADVIL) 200 MG tablet Take 200 mg by mouth every 6 (six) hours as needed.    . lidocaine-prilocaine (EMLA) cream Apply to the Port-A-Cath site 30-60 minutes before chemotherapy. 30 g 0  . Omega-3 Fatty Acids (FISH OIL PO) Take 1 capsule by mouth daily.     . pravastatin (PRAVACHOL) 40 MG tablet TAKE 1 TABLET BY MOUTH ONCE DAILY 90 tablet 3  . prochlorperazine (COMPAZINE) 10 MG tablet Take 1 tablet (10 mg total) by mouth every 6 (six) hours as needed for nausea or vomiting. 30 tablet 0  . vardenafil (LEVITRA) 20 MG tablet TAKE 1 TABLET BY MOUTH AS NEEDED FOR  ERECTILE  DYSFUNCTION 10 tablet 5  . Vitamin D, Ergocalciferol, (DRISDOL)  1.25 MG (50000 UNIT) CAPS capsule Take 1 capsule (50,000 Units total) by mouth every 7 (seven) days. 12 capsule 0  . dexamethasone (DECADRON) 4 MG tablet Take 1 tablet (4 mg total) by mouth 3 (three) times daily. (Patient not taking: Reported on 08/28/2020) 90 tablet 0   No current facility-administered medications for this visit.    SURGICAL HISTORY:  Past Surgical History:  Procedure Laterality Date  . BRONCHIAL BIOPSY  03/30/2020   Procedure: BRONCHIAL BIOPSIES;  Surgeon: Garner Nash, DO;  Location: [facility];  Service: Pulmonary;;  . BRONCHIAL BRUSHINGS  03/30/2020   Procedure: BRONCHIAL  BRUSHINGS;  Surgeon: Garner Nash, DO;  Location: [facility];  Service: Pulmonary;;  . BRONCHIAL NEEDLE ASPIRATION BIOPSY  03/30/2020   Procedure: BRONCHIAL NEEDLE ASPIRATION BIOPSIES;  Surgeon: Garner Nash, DO;  Location: [facility];  Service: Pulmonary;;  . BRONCHIAL WASHINGS  03/30/2020   Procedure: BRONCHIAL WASHINGS;  Surgeon: Garner Nash, DO;  Location: [facility];  Service: Pulmonary;;  . COLONOSCOPY  2007  . IR IMAGING GUIDED PORT INSERTION  04/23/2020  . neck fusion  2012   C4  . NO PAST SURGERIES    . VIDEO BRONCHOSCOPY WITH ENDOBRONCHIAL NAVIGATION N/A 03/30/2020   Procedure: VIDEO BRONCHOSCOPY WITH ENDOBRONCHIAL NAVIGATION;  Surgeon: Garner Nash, DO;  Location: [facility];  Service: Pulmonary;  Laterality: N/A;  . VIDEO BRONCHOSCOPY WITH ENDOBRONCHIAL ULTRASOUND N/A 03/30/2020   Procedure: VIDEO BRONCHOSCOPY WITH ENDOBRONCHIAL ULTRASOUND;  Surgeon: Garner Nash, DO;  Location: [facility];  Service: Pulmonary;  Laterality: N/A;    REVIEW OF SYSTEMS:  Constitutional: negative Eyes: negative Ears, nose, mouth, throat, and face: negative Respiratory: negative Cardiovascular: negative Gastrointestinal: negative Genitourinary:negative Integument/breast: negative Hematologic/lymphatic: negative Musculoskeletal:negative Neurological: negative Behavioral/Psych: negative Endocrine: negative Allergic/Immunologic: negative   PHYSICAL EXAMINATION: General appearance: alert, cooperative and no distress Head: Normocephalic, without obvious abnormality, atraumatic Neck: no adenopathy, no JVD, supple, symmetrical, trachea midline and thyroid not enlarged, symmetric, no tenderness/mass/nodules Lymph nodes: Cervical, supraclavicular, and axillary nodes normal. Resp: clear to auscultation bilaterally Back: symmetric, no curvature. ROM normal. No CVA tenderness. Cardio: regular rate and rhythm, S1, S2 normal, no murmur, click, rub or gallop GI: soft,  non-tender; bowel sounds normal; no masses,  no organomegaly Extremities: extremities normal, atraumatic, no cyanosis or edema Neurologic: Alert and oriented X 3, normal strength and tone. Normal symmetric reflexes. Normal coordination and gait  ECOG PERFORMANCE STATUS: 1 - Symptomatic but completely ambulatory  Blood pressure 120/65, pulse 86, temperature (!) 96.9 F (36.1 C), temperature source Tympanic, resp. rate 19, height 6' (1.829 m), weight 164 lb 11.2 oz (74.7 kg), SpO2 100 %.  LABORATORY DATA: Lab Results  Component Value Date   WBC 4.9 08/28/2020   HGB 10.3 (L) 08/28/2020   HCT 29.7 (L) 08/28/2020   MCV 87.6 08/28/2020   PLT 164 08/28/2020      Chemistry      Component Value Date/Time   NA 138 08/28/2020 0857   K 3.6 08/28/2020 0857   CL 106 08/28/2020 0857   CO2 24 08/28/2020 0857   BUN 13 08/28/2020 0857   CREATININE 1.02 08/28/2020 0857   CREATININE 0.88 02/21/2020 1115      Component Value Date/Time   CALCIUM 8.8 (L) 08/28/2020 0857   ALKPHOS 48 08/28/2020 0857   AST 30 08/28/2020 0857   ALT 40 08/28/2020 0857   BILITOT 0.5 08/28/2020 0857       RADIOGRAPHIC STUDIES: CT CHEST ABDOMEN PELVIS W CONTRAST  Result Date: 08/27/2020 CLINICAL DATA:  Non-small-cell lung cancer, favoring adenocarcinoma. Presented with right upper lobe lung mass, mediastinal adenopathy and metastatic brain lesions. Diagnosed in October. Currently on chemotherapy. EXAM: CT CHEST, ABDOMEN, AND PELVIS WITH CONTRAST TECHNIQUE: Multidetector CT imaging of the chest, abdomen and pelvis was performed following the standard protocol during bolus administration of intravenous contrast. CONTRAST:  25mL OMNIPAQUE IOHEXOL 300 MG/ML  SOLN COMPARISON:  06/25/2020 FINDINGS: CT CHEST FINDINGS Cardiovascular: Right Port-A-Cath tip high right atrium. Aortic atherosclerosis. Normal heart size, without pericardial effusion. No central pulmonary embolism, on this non-dedicated study. Mediastinum/Nodes: No  supraclavicular adenopathy. No axillary adenopathy. No mediastinal or hilar adenopathy. Lungs/Pleura: No pleural fluid. Centrilobular and paraseptal emphysema. Index posterior right upper lobe spiculated lung mass measures 4.1 by 1.6 cm on 74/5 and is similar to on the prior exam (when remeasured). Mixed attenuation right lower lobe lung nodule measures 2.2 cm on 98/5 versus 2.0 cm on the prior exam. More cephalad medial right lower lobe 1.7 cm mixed attenuation nodule on 75/5 measured 1.8 cm on the prior exam (when remeasured). Cavitary pleural-based left lower lobe lung lesion measures 3.4 cm on 117/5 versus 3.9 cm on the prior exam. Multiple other mixed attenuation and ground-glass nodules are relatively similar. These are identified on series 5. New left upper lobe "tree-in-bud" nodularity including on 87/5. Subjacent lingular ground-glass on 107/5. Musculoskeletal: Subcutaneous nodules about the anterior thoracoabdominal wall are unchanged and nonspecific. No acute osseous abnormality. T10 inferior endplate sclerosis is unchanged and likely degenerative. Cervical spine fixation. CT ABDOMEN PELVIS FINDINGS Hepatobiliary: Normal liver. Normal gallbladder, without biliary ductal dilatation. Pancreas: Pancreas divisum or variant, without duct dilatation or acute inflammation. Spleen: Normal in size, without focal abnormality. Adrenals/Urinary Tract: Normal adrenal glands. Normal kidneys, without hydronephrosis. Normal urinary bladder. Stomach/Bowel: Normal stomach, without wall thickening. Normal colon, appendix, and terminal ileum. Normal small bowel. Vascular/Lymphatic: Advanced aortic and branch vessel atherosclerosis. No abdominopelvic adenopathy. Reproductive: Normal prostate. Other: No significant free fluid. No evidence of omental or peritoneal disease. Musculoskeletal: Degenerative partial fusion of bilateral sacroiliac joints. IMPRESSION: 1. Similar solid and mixed attenuation pulmonary nodules, most  consistent with multifocal adenocarcinoma. 2. No new or progressive disease. 3. New left upper lobe tree-in-bud nodularity and ground-glass, suspicious for infectious bronchiolitis. 4. No acute process or evidence of metastatic disease in the abdomen or pelvis. 5. Suspicion of pancreas divisum or variant.  No acute pancreatitis. 6. Aortic atherosclerosis (ICD10-I70.0) and emphysema (ICD10-J43.9). Electronically Signed   By: Abigail Miyamoto M.D.   On: 08/27/2020 15:16    ASSESSMENT AND PLAN: This is a very pleasant 65 years old African-American male recently diagnosed with a stage IV (T3, N2, M1c)  non-small cell lung cancer, adenocarcinoma presented with right upper lobe lung mass in addition to right lower lobe, left lower lobe in addition to right hilar and mediastinal lymphadenopathy in addition to metastatic brain lesions diagnosed in October 2021. The patient has molecular studies by [employer] that showed no actionable mutations. He underwent SRS treatment to his brain lesion. The patient is currently undergoing systemic chemotherapy with carboplatin for AUC of 5, Alimta 500 mg/M2 and Keytruda 200 mg IV every 3 weeks status post 6 cycles.  Starting from cycle #5 the patient will be on maintenance treatment with Alimta and Keytruda every 3 weeks. The patient continues to tolerate this treatment well with no concerning adverse effects. He had repeat CT scan of the chest, abdomen pelvis performed recently.  I personally and independently reviewed the scan  images and discussed the results with the patient and his wife. Has a scan showed no concerning findings for disease progression.  He has new left upper lobe tree-in-bud nodularity suspicious for inflammatory process and will continue to monitor this closely.  The patient is currently asymptomatic. I recommended for him to proceed with cycle #7 today as planned. He will come back for follow-up visit in 3 weeks for evaluation before the next cycle of  his treatment. The patient was advised to call immediately if he has any concerning symptoms in the interval. The patient voices understanding of current disease status and treatment options and is in agreement with the current care plan.  All questions were answered. The patient knows to call the clinic with any problems, questions or concerns. We can certainly see the patient much sooner if necessary.  Disclaimer: This note was dictated with voice recognition software. Similar sounding words can inadvertently be transcribed and may not be corrected upon review.

## 2020-09-12 ENCOUNTER — Other Ambulatory Visit: Payer: Self-pay | Admitting: Radiation Therapy

## 2020-09-12 DIAGNOSIS — C7931 Secondary malignant neoplasm of brain: Secondary | ICD-10-CM

## 2020-09-12 DIAGNOSIS — C7949 Secondary malignant neoplasm of other parts of nervous system: Secondary | ICD-10-CM

## 2020-09-12 NOTE — Progress Notes (Signed)
Order placed for [place] access the day of brain MRI in May, [facility],   [place] R.T.(R)(T)

## 2020-09-18 ENCOUNTER — Inpatient Hospital Stay: Payer: 59 | Attending: Internal Medicine

## 2020-09-18 ENCOUNTER — Inpatient Hospital Stay: Payer: 59

## 2020-09-18 ENCOUNTER — Other Ambulatory Visit: Payer: Self-pay

## 2020-09-18 ENCOUNTER — Encounter: Payer: Self-pay | Admitting: Internal Medicine

## 2020-09-18 ENCOUNTER — Encounter: Payer: Self-pay | Admitting: *Deleted

## 2020-09-18 ENCOUNTER — Inpatient Hospital Stay (HOSPITAL_BASED_OUTPATIENT_CLINIC_OR_DEPARTMENT_OTHER): Payer: 59 | Admitting: Internal Medicine

## 2020-09-18 VITALS — BP 105/66 | HR 71 | Temp 98.3°F | Resp 15 | Ht 72.0 in | Wt 159.8 lb

## 2020-09-18 DIAGNOSIS — Z5112 Encounter for antineoplastic immunotherapy: Secondary | ICD-10-CM

## 2020-09-18 DIAGNOSIS — C3431 Malignant neoplasm of lower lobe, right bronchus or lung: Secondary | ICD-10-CM | POA: Insufficient documentation

## 2020-09-18 DIAGNOSIS — C3491 Malignant neoplasm of unspecified part of right bronchus or lung: Secondary | ICD-10-CM | POA: Diagnosis not present

## 2020-09-18 DIAGNOSIS — I10 Essential (primary) hypertension: Secondary | ICD-10-CM | POA: Diagnosis not present

## 2020-09-18 DIAGNOSIS — C3411 Malignant neoplasm of upper lobe, right bronchus or lung: Secondary | ICD-10-CM | POA: Diagnosis present

## 2020-09-18 DIAGNOSIS — C3432 Malignant neoplasm of lower lobe, left bronchus or lung: Secondary | ICD-10-CM | POA: Insufficient documentation

## 2020-09-18 DIAGNOSIS — C7931 Secondary malignant neoplasm of brain: Secondary | ICD-10-CM | POA: Insufficient documentation

## 2020-09-18 DIAGNOSIS — Z87891 Personal history of nicotine dependence: Secondary | ICD-10-CM | POA: Diagnosis not present

## 2020-09-18 DIAGNOSIS — Z5111 Encounter for antineoplastic chemotherapy: Secondary | ICD-10-CM | POA: Insufficient documentation

## 2020-09-18 DIAGNOSIS — Z79899 Other long term (current) drug therapy: Secondary | ICD-10-CM | POA: Diagnosis not present

## 2020-09-18 DIAGNOSIS — Z95828 Presence of other vascular implants and grafts: Secondary | ICD-10-CM

## 2020-09-18 LAB — CMP (CANCER CENTER ONLY)
ALT: 23 U/L (ref 0–44)
AST: 26 U/L (ref 15–41)
Albumin: 3.5 g/dL (ref 3.5–5.0)
Alkaline Phosphatase: 52 U/L (ref 38–126)
Anion gap: 12 (ref 5–15)
BUN: 13 mg/dL (ref 8–23)
CO2: 24 mmol/L (ref 22–32)
Calcium: 8.8 mg/dL — ABNORMAL LOW (ref 8.9–10.3)
Chloride: 101 mmol/L (ref 98–111)
Creatinine: 1.16 mg/dL (ref 0.61–1.24)
GFR, Estimated: >60 mL/min (ref >60)
Glucose, Bld: 121 mg/dL — ABNORMAL HIGH (ref 70–99)
Potassium: 3.9 mmol/L (ref 3.5–5.1)
Sodium: 137 mmol/L (ref 135–145)
Total Bilirubin: 0.4 mg/dL (ref 0.3–1.2)
Total Protein: 7.4 g/dL (ref 6.5–8.1)

## 2020-09-18 LAB — CBC WITH DIFFERENTIAL (CANCER CENTER ONLY)
Abs Immature Granulocytes: 0.01 10*3/uL (ref 0.00–0.07)
Basophils Absolute: 0 10*3/uL (ref 0.0–0.1)
Basophils Relative: 1 %
Eosinophils Absolute: 0.3 10*3/uL (ref 0.0–0.5)
Eosinophils Relative: 5 %
HCT: 31.5 % — ABNORMAL LOW (ref 39.0–52.0)
Hemoglobin: 10.7 g/dL — ABNORMAL LOW (ref 13.0–17.0)
Immature Granulocytes: 0 %
Lymphocytes Relative: 23 %
Lymphs Abs: 1.2 10*3/uL (ref 0.7–4.0)
MCH: 29.4 pg (ref 26.0–34.0)
MCHC: 34 g/dL (ref 30.0–36.0)
MCV: 86.5 fL (ref 80.0–100.0)
Monocytes Absolute: 0.9 10*3/uL (ref 0.1–1.0)
Monocytes Relative: 17 %
Neutro Abs: 2.8 10*3/uL (ref 1.7–7.7)
Neutrophils Relative %: 54 %
Platelet Count: 198 10*3/uL (ref 150–400)
RBC: 3.64 MIL/uL — ABNORMAL LOW (ref 4.22–5.81)
RDW: 13.5 % (ref 11.5–15.5)
WBC Count: 5.3 10*3/uL (ref 4.0–10.5)
nRBC: 0 % (ref 0.0–0.2)

## 2020-09-18 LAB — TSH: TSH: 1.761 u[IU]/mL (ref 0.320–4.118)

## 2020-09-18 MED ORDER — SODIUM CHLORIDE 0.9 % IV SOLN
500.0000 mg/m2 | Freq: Once | INTRAVENOUS | Status: AC
  Administered 2020-09-18: 900 mg via INTRAVENOUS
  Filled 2020-09-18: qty 20

## 2020-09-18 MED ORDER — HEPARIN SOD (PORK) LOCK FLUSH 100 UNIT/ML IV SOLN
500.0000 [IU] | Freq: Once | INTRAVENOUS | Status: AC | PRN
  Administered 2020-09-18: 500 [IU]
  Filled 2020-09-18: qty 5

## 2020-09-18 MED ORDER — SODIUM CHLORIDE 0.9 % IV SOLN
Freq: Once | INTRAVENOUS | Status: AC
  Filled 2020-09-18: qty 250

## 2020-09-18 MED ORDER — PROCHLORPERAZINE MALEATE 10 MG PO TABS
10.0000 mg | ORAL_TABLET | Freq: Once | ORAL | Status: AC
  Administered 2020-09-18: 10 mg via ORAL

## 2020-09-18 MED ORDER — SODIUM CHLORIDE 0.9% FLUSH
10.0000 mL | INTRAVENOUS | Status: DC | PRN
  Administered 2020-09-18: 10 mL
  Filled 2020-09-18: qty 10

## 2020-09-18 MED ORDER — PROCHLORPERAZINE MALEATE 10 MG PO TABS
ORAL_TABLET | ORAL | Status: AC
  Filled 2020-09-18: qty 1

## 2020-09-18 MED ORDER — SODIUM CHLORIDE 0.9 % IV SOLN
200.0000 mg | Freq: Once | INTRAVENOUS | Status: AC
  Administered 2020-09-18: 200 mg via INTRAVENOUS
  Filled 2020-09-18: qty 8

## 2020-09-18 MED ORDER — SODIUM CHLORIDE 0.9% FLUSH
10.0000 mL | Freq: Once | INTRAVENOUS | Status: AC
  Administered 2020-09-18: 10 mL
  Filled 2020-09-18: qty 10

## 2020-09-18 NOTE — Progress Notes (Signed)
Spoke with Mr and Ms Thal today.  He is doing well without complaint. No barriers identified at this time.

## 2020-09-18 NOTE — Patient Instructions (Signed)
[facility] Discharge Instructions for Patients Receiving Chemotherapy  Today you received the following immunotherapy agent: Pembrolizumab (Keytruda) and chemotherapy agent: Pemetrexed (Alimta).  To help prevent nausea and vomiting after your treatment, we encourage you to take your nausea medication as directed by your MD.   If you develop nausea and vomiting that is not controlled by your nausea medication, call the clinic.   BELOW ARE SYMPTOMS THAT SHOULD BE REPORTED IMMEDIATELY:  *FEVER GREATER THAN 100.5 F  *CHILLS WITH OR WITHOUT FEVER  NAUSEA AND VOMITING THAT IS NOT CONTROLLED WITH YOUR NAUSEA MEDICATION  *UNUSUAL SHORTNESS OF BREATH  *UNUSUAL BRUISING OR BLEEDING  TENDERNESS IN MOUTH AND THROAT WITH OR WITHOUT PRESENCE OF ULCERS  *URINARY PROBLEMS  *BOWEL PROBLEMS  UNUSUAL RASH Items with * indicate a potential emergency and should be followed up as soon as possible.  Feel free to call the clinic should you have any questions or concerns. The clinic phone number is [phone].  Please show the [facility] at check-in to the Emergency Department and triage nurse.

## 2020-09-18 NOTE — Progress Notes (Signed)
[place] [phone]   Fax:(336) [phone]  OFFICE PROGRESS NOTE  [name], MD [place] [address]  DIAGNOSIS: Stage IV (T3, N2, M1c) non-small cell lung cancer favoring adenocarcinoma presented with right upper lobe lung mass in addition to right lower lobe, left lower lobe in addition to right hilar and mediastinal lymphadenopathy in addition to metastatic brain lesions diagnosed in October 2021.  Molecular studies by [employer]:  [id number], 9.5%,   PRIOR THERAPY: SRS to 3 brain lesion under the care of Dr. Lisbeth Renshaw.  Scheduled for April 20, 2020  CURRENT THERAPY: Systemic chemotherapy with carboplatin for AUC of 5, Alimta 500 mg/M2 and Keytruda 200 mg IV every 3 weeks.  First dose April 24, 2020.  Status post 7 cycles.  Starting from cycle #5 the patient will be on maintenance treatment with Alimta and Keytruda every 3 weeks.  INTERVAL HISTORY: Nicolas Allen 65 y.o. male returns to the clinic today for follow-up visit accompanied by his wife.  The patient is feeling fine today with no concerning complaints but he lost around 4 pounds since his last visit.  He is more active these days.  He denied having any chest pain, shortness of breath, cough or hemoptysis.  He denied having any fever or chills.  He has no nausea, vomiting, diarrhea or constipation.  He has no headache or visual changes.  He is here today for evaluation before starting cycle #8 of his treatment.   MEDICAL HISTORY: Past Medical History:  Diagnosis Date  . Anemia 06/24/2012  . ANXIETY 02/03/2007  . Cancer ([place])   . Cervical disc disease 02/12/2012   S/p surgury jan 2013  . Erectile dysfunction 02/11/2011  . GERD (gastroesophageal reflux disease)   . GLUCOSE INTOLERANCE 01/04/2008  . HYPERLIPIDEMIA 02/03/2007  . HYPERTENSION 02/03/2007  . IBS (irritable bowel syndrome)   . Impaired glucose tolerance 02/11/2011  . Smoker 08/22/2014  . Substance abuse  ([place]) 2001   sober for 16 yrs    ALLERGIES:  has No Known Allergies.  MEDICATIONS:  Current Outpatient Medications  Medication Sig Dispense Refill  . amLODipine-benazepril (LOTREL) 10-20 MG capsule TAKE 1 CAPSULE BY MOUTH ONCE DAILY 90 capsule 3  . aspirin EC 81 MG tablet Take 81 mg by mouth daily. Swallow whole.    . b complex vitamins tablet Take 1 tablet by mouth daily.    Marland Kitchen dexamethasone (DECADRON) 4 MG tablet Take 1 tablet (4 mg total) by mouth 3 (three) times daily. (Patient not taking: Reported on 08/28/2020) 90 tablet 0  . folic acid (FOLVITE) 1 MG tablet Take 1 tablet (1 mg total) by mouth daily. 30 tablet 4  . Garlic 8119 MG CAPS Take 1,000 mg by mouth daily.     Marland Kitchen ibuprofen (ADVIL) 200 MG tablet Take 200 mg by mouth every 6 (six) hours as needed.    . lidocaine-prilocaine (EMLA) cream Apply to the Port-A-Cath site 30-60 minutes before chemotherapy. 30 g 0  . Omega-3 Fatty Acids (FISH OIL PO) Take 1 capsule by mouth daily.     . pravastatin (PRAVACHOL) 40 MG tablet TAKE 1 TABLET BY MOUTH ONCE DAILY 90 tablet 3  . prochlorperazine (COMPAZINE) 10 MG tablet Take 1 tablet (10 mg total) by mouth every 6 (six) hours as needed for nausea or vomiting. 30 tablet 0  . vardenafil (LEVITRA) 20 MG tablet TAKE 1 TABLET BY MOUTH AS NEEDED FOR  ERECTILE  DYSFUNCTION  10 tablet 5  . Vitamin D, Ergocalciferol, (DRISDOL) 1.25 MG (50000 UNIT) CAPS capsule Take 1 capsule (50,000 Units total) by mouth every 7 (seven) days. 12 capsule 0   No current facility-administered medications for this visit.    SURGICAL HISTORY:  Past Surgical History:  Procedure Laterality Date  . BRONCHIAL BIOPSY  03/30/2020   Procedure: BRONCHIAL BIOPSIES;  Surgeon: Garner Nash, DO;  Location: [facility];  Service: Pulmonary;;  . BRONCHIAL BRUSHINGS  03/30/2020   Procedure: BRONCHIAL BRUSHINGS;  Surgeon: Garner Nash, DO;  Location: [facility];  Service: Pulmonary;;  . BRONCHIAL NEEDLE ASPIRATION BIOPSY   03/30/2020   Procedure: BRONCHIAL NEEDLE ASPIRATION BIOPSIES;  Surgeon: Garner Nash, DO;  Location: [facility];  Service: Pulmonary;;  . BRONCHIAL WASHINGS  03/30/2020   Procedure: BRONCHIAL WASHINGS;  Surgeon: Garner Nash, DO;  Location: [facility];  Service: Pulmonary;;  . COLONOSCOPY  2007  . IR IMAGING GUIDED PORT INSERTION  04/23/2020  . neck fusion  2012   C4  . NO PAST SURGERIES    . VIDEO BRONCHOSCOPY WITH ENDOBRONCHIAL NAVIGATION N/A 03/30/2020   Procedure: VIDEO BRONCHOSCOPY WITH ENDOBRONCHIAL NAVIGATION;  Surgeon: Garner Nash, DO;  Location: [facility];  Service: Pulmonary;  Laterality: N/A;  . VIDEO BRONCHOSCOPY WITH ENDOBRONCHIAL ULTRASOUND N/A 03/30/2020   Procedure: VIDEO BRONCHOSCOPY WITH ENDOBRONCHIAL ULTRASOUND;  Surgeon: Garner Nash, DO;  Location: [facility];  Service: Pulmonary;  Laterality: N/A;    REVIEW OF SYSTEMS:  A comprehensive review of systems was negative except for: Constitutional: positive for weight loss   PHYSICAL EXAMINATION: General appearance: alert, cooperative and no distress Head: Normocephalic, without obvious abnormality, atraumatic Neck: no adenopathy, no JVD, supple, symmetrical, trachea midline and thyroid not enlarged, symmetric, no tenderness/mass/nodules Lymph nodes: Cervical, supraclavicular, and axillary nodes normal. Resp: clear to auscultation bilaterally Back: symmetric, no curvature. ROM normal. No CVA tenderness. Cardio: regular rate and rhythm, S1, S2 normal, no murmur, click, rub or gallop GI: soft, non-tender; bowel sounds normal; no masses,  no organomegaly Extremities: extremities normal, atraumatic, no cyanosis or edema  ECOG PERFORMANCE STATUS: 1 - Symptomatic but completely ambulatory  Blood pressure 105/66, pulse 71, temperature 98.3 F (36.8 C), temperature source Tympanic, resp. rate 15, height 6' (1.829 m), weight 159 lb 12.8 oz (72.5 kg), SpO2 100 %.  LABORATORY DATA: Lab Results   Component Value Date   WBC 5.3 09/18/2020   HGB 10.7 (L) 09/18/2020   HCT 31.5 (L) 09/18/2020   MCV 86.5 09/18/2020   PLT 198 09/18/2020      Chemistry      Component Value Date/Time   NA 138 08/28/2020 0857   K 3.6 08/28/2020 0857   CL 106 08/28/2020 0857   CO2 24 08/28/2020 0857   BUN 13 08/28/2020 0857   CREATININE 1.02 08/28/2020 0857   CREATININE 0.88 02/21/2020 1115      Component Value Date/Time   CALCIUM 8.8 (L) 08/28/2020 0857   ALKPHOS 48 08/28/2020 0857   AST 30 08/28/2020 0857   ALT 40 08/28/2020 0857   BILITOT 0.5 08/28/2020 0857       RADIOGRAPHIC STUDIES: CT CHEST ABDOMEN PELVIS W CONTRAST  Result Date: 08/27/2020 CLINICAL DATA:  Non-small-cell lung cancer, favoring adenocarcinoma. Presented with right upper lobe lung mass, mediastinal adenopathy and metastatic brain lesions. Diagnosed in October. Currently on chemotherapy. EXAM: CT CHEST, ABDOMEN, AND PELVIS WITH CONTRAST TECHNIQUE: Multidetector CT imaging of the chest, abdomen and pelvis was performed following the standard protocol  during bolus administration of intravenous contrast. CONTRAST:  22mL OMNIPAQUE IOHEXOL 300 MG/ML  SOLN COMPARISON:  06/25/2020 FINDINGS: CT CHEST FINDINGS Cardiovascular: Right Port-A-Cath tip high right atrium. Aortic atherosclerosis. Normal heart size, without pericardial effusion. No central pulmonary embolism, on this non-dedicated study. Mediastinum/Nodes: No supraclavicular adenopathy. No axillary adenopathy. No mediastinal or hilar adenopathy. Lungs/Pleura: No pleural fluid. Centrilobular and paraseptal emphysema. Index posterior right upper lobe spiculated lung mass measures 4.1 by 1.6 cm on 74/5 and is similar to on the prior exam (when remeasured). Mixed attenuation right lower lobe lung nodule measures 2.2 cm on 98/5 versus 2.0 cm on the prior exam. More cephalad medial right lower lobe 1.7 cm mixed attenuation nodule on 75/5 measured 1.8 cm on the prior exam (when  remeasured). Cavitary pleural-based left lower lobe lung lesion measures 3.4 cm on 117/5 versus 3.9 cm on the prior exam. Multiple other mixed attenuation and ground-glass nodules are relatively similar. These are identified on series 5. New left upper lobe "tree-in-bud" nodularity including on 87/5. Subjacent lingular ground-glass on 107/5. Musculoskeletal: Subcutaneous nodules about the anterior thoracoabdominal wall are unchanged and nonspecific. No acute osseous abnormality. T10 inferior endplate sclerosis is unchanged and likely degenerative. Cervical spine fixation. CT ABDOMEN PELVIS FINDINGS Hepatobiliary: Normal liver. Normal gallbladder, without biliary ductal dilatation. Pancreas: Pancreas divisum or variant, without duct dilatation or acute inflammation. Spleen: Normal in size, without focal abnormality. Adrenals/Urinary Tract: Normal adrenal glands. Normal kidneys, without hydronephrosis. Normal urinary bladder. Stomach/Bowel: Normal stomach, without wall thickening. Normal colon, appendix, and terminal ileum. Normal small bowel. Vascular/Lymphatic: Advanced aortic and branch vessel atherosclerosis. No abdominopelvic adenopathy. Reproductive: Normal prostate. Other: No significant free fluid. No evidence of omental or peritoneal disease. Musculoskeletal: Degenerative partial fusion of bilateral sacroiliac joints. IMPRESSION: 1. Similar solid and mixed attenuation pulmonary nodules, most consistent with multifocal adenocarcinoma. 2. No new or progressive disease. 3. New left upper lobe tree-in-bud nodularity and ground-glass, suspicious for infectious bronchiolitis. 4. No acute process or evidence of metastatic disease in the abdomen or pelvis. 5. Suspicion of pancreas divisum or variant.  No acute pancreatitis. 6. Aortic atherosclerosis (ICD10-I70.0) and emphysema (ICD10-J43.9). Electronically Signed   By: Abigail Miyamoto M.D.   On: 08/27/2020 15:16    ASSESSMENT AND PLAN: This is a very pleasant 65  years old African-American male recently diagnosed with a stage IV (T3, N2, M1c)  non-small cell lung cancer, adenocarcinoma presented with right upper lobe lung mass in addition to right lower lobe, left lower lobe in addition to right hilar and mediastinal lymphadenopathy in addition to metastatic brain lesions diagnosed in October 2021. The patient has molecular studies by [employer] that showed no actionable mutations. He underwent SRS treatment to his brain lesion. The patient is currently undergoing systemic chemotherapy with carboplatin for AUC of 5, Alimta 500 mg/M2 and Keytruda 200 mg IV every 3 weeks status post 7 cycles.  Starting from cycle #5 the patient will be on maintenance treatment with Alimta and Keytruda every 3 weeks. The patient continues to tolerate his treatment with Alimta and Keytruda fairly well. I recommended for him to proceed with cycle #3 today as planned. I will see him back for follow-up visit in 3 weeks for evaluation before the next cycle of his treatment. He was advised to call immediately if he has any concerning symptoms in the interval. The patient voices understanding of current disease status and treatment options and is in agreement with the current care plan.  All questions were answered. The patient  knows to call the clinic with any problems, questions or concerns. We can certainly see the patient much sooner if necessary.  Disclaimer: This note was dictated with voice recognition software. Similar sounding words can inadvertently be transcribed and may not be corrected upon review.

## 2020-09-19 ENCOUNTER — Other Ambulatory Visit: Payer: Self-pay | Admitting: Pharmacist

## 2020-09-20 ENCOUNTER — Telehealth: Payer: Self-pay | Admitting: Internal Medicine

## 2020-09-20 NOTE — Telephone Encounter (Signed)
Scheduled per los. Called and spoke with patient. Confirmed added appts

## 2020-10-09 ENCOUNTER — Other Ambulatory Visit: Payer: Self-pay

## 2020-10-09 ENCOUNTER — Inpatient Hospital Stay: Payer: 59

## 2020-10-09 ENCOUNTER — Other Ambulatory Visit: Payer: 59

## 2020-10-09 ENCOUNTER — Inpatient Hospital Stay (HOSPITAL_BASED_OUTPATIENT_CLINIC_OR_DEPARTMENT_OTHER): Payer: 59 | Admitting: Internal Medicine

## 2020-10-09 VITALS — BP 111/57 | HR 93 | Temp 99.4°F | Resp 20 | Ht 72.0 in | Wt 160.4 lb

## 2020-10-09 DIAGNOSIS — C3490 Malignant neoplasm of unspecified part of unspecified bronchus or lung: Secondary | ICD-10-CM

## 2020-10-09 DIAGNOSIS — Z5111 Encounter for antineoplastic chemotherapy: Secondary | ICD-10-CM

## 2020-10-09 DIAGNOSIS — C3491 Malignant neoplasm of unspecified part of right bronchus or lung: Secondary | ICD-10-CM

## 2020-10-09 DIAGNOSIS — I10 Essential (primary) hypertension: Secondary | ICD-10-CM

## 2020-10-09 DIAGNOSIS — Z95828 Presence of other vascular implants and grafts: Secondary | ICD-10-CM

## 2020-10-09 DIAGNOSIS — Z5112 Encounter for antineoplastic immunotherapy: Secondary | ICD-10-CM

## 2020-10-09 LAB — CBC WITH DIFFERENTIAL (CANCER CENTER ONLY)
Abs Immature Granulocytes: 0.1 10*3/uL — ABNORMAL HIGH (ref 0.00–0.07)
Basophils Absolute: 0 10*3/uL (ref 0.0–0.1)
Basophils Relative: 0 %
Eosinophils Absolute: 0.1 10*3/uL (ref 0.0–0.5)
Eosinophils Relative: 1 %
HCT: 31 % — ABNORMAL LOW (ref 39.0–52.0)
Hemoglobin: 10.6 g/dL — ABNORMAL LOW (ref 13.0–17.0)
Immature Granulocytes: 1 %
Lymphocytes Relative: 12 %
Lymphs Abs: 0.9 10*3/uL (ref 0.7–4.0)
MCH: 29.3 pg (ref 26.0–34.0)
MCHC: 34.2 g/dL (ref 30.0–36.0)
MCV: 85.6 fL (ref 80.0–100.0)
Monocytes Absolute: 0.8 10*3/uL (ref 0.1–1.0)
Monocytes Relative: 9 %
Neutro Abs: 6.1 10*3/uL (ref 1.7–7.7)
Neutrophils Relative %: 77 %
Platelet Count: 178 10*3/uL (ref 150–400)
RBC: 3.62 MIL/uL — ABNORMAL LOW (ref 4.22–5.81)
RDW: 14.7 % (ref 11.5–15.5)
WBC Count: 8 10*3/uL (ref 4.0–10.5)
nRBC: 0 % (ref 0.0–0.2)

## 2020-10-09 LAB — CMP (CANCER CENTER ONLY)
ALT: 20 U/L (ref 0–44)
AST: 29 U/L (ref 15–41)
Albumin: 3.2 g/dL — ABNORMAL LOW (ref 3.5–5.0)
Alkaline Phosphatase: 50 U/L (ref 38–126)
Anion gap: 12 (ref 5–15)
BUN: 15 mg/dL (ref 8–23)
CO2: 24 mmol/L (ref 22–32)
Calcium: 9.1 mg/dL (ref 8.9–10.3)
Chloride: 100 mmol/L (ref 98–111)
Creatinine: 1.09 mg/dL (ref 0.61–1.24)
GFR, Estimated: >60 mL/min (ref >60)
Glucose, Bld: 121 mg/dL — ABNORMAL HIGH (ref 70–99)
Potassium: 3.9 mmol/L (ref 3.5–5.1)
Sodium: 136 mmol/L (ref 135–145)
Total Bilirubin: 0.3 mg/dL (ref 0.3–1.2)
Total Protein: 7.3 g/dL (ref 6.5–8.1)

## 2020-10-09 LAB — TSH: TSH: 1.436 u[IU]/mL (ref 0.320–4.118)

## 2020-10-09 MED ORDER — SODIUM CHLORIDE 0.9% FLUSH
10.0000 mL | Freq: Once | INTRAVENOUS | Status: AC
  Administered 2020-10-09: 10 mL
  Filled 2020-10-09: qty 10

## 2020-10-09 MED ORDER — SODIUM CHLORIDE 0.9% FLUSH
10.0000 mL | INTRAVENOUS | Status: DC | PRN
  Administered 2020-10-09: 10 mL
  Filled 2020-10-09: qty 10

## 2020-10-09 MED ORDER — PROCHLORPERAZINE MALEATE 10 MG PO TABS
10.0000 mg | ORAL_TABLET | Freq: Once | ORAL | Status: AC
  Administered 2020-10-09: 10 mg via ORAL

## 2020-10-09 MED ORDER — SODIUM CHLORIDE 0.9 % IV SOLN
200.0000 mg | Freq: Once | INTRAVENOUS | Status: AC
  Administered 2020-10-09: 200 mg via INTRAVENOUS
  Filled 2020-10-09: qty 8

## 2020-10-09 MED ORDER — HEPARIN SOD (PORK) LOCK FLUSH 100 UNIT/ML IV SOLN
500.0000 [IU] | Freq: Once | INTRAVENOUS | Status: AC | PRN
  Administered 2020-10-09: 500 [IU]
  Filled 2020-10-09: qty 5

## 2020-10-09 MED ORDER — SODIUM CHLORIDE 0.9 % IV SOLN
500.0000 mg/m2 | Freq: Once | INTRAVENOUS | Status: AC
  Administered 2020-10-09: 900 mg via INTRAVENOUS
  Filled 2020-10-09: qty 20

## 2020-10-09 MED ORDER — PROCHLORPERAZINE MALEATE 10 MG PO TABS
ORAL_TABLET | ORAL | Status: AC
  Filled 2020-10-09: qty 1

## 2020-10-09 MED ORDER — SODIUM CHLORIDE 0.9 % IV SOLN
Freq: Once | INTRAVENOUS | Status: AC
  Filled 2020-10-09: qty 250

## 2020-10-09 MED ORDER — CYANOCOBALAMIN 1000 MCG/ML IJ SOLN
1000.0000 ug | Freq: Once | INTRAMUSCULAR | Status: AC
Start: 2020-10-09 — End: 2020-10-09
  Administered 2020-10-09: 1000 ug via INTRAMUSCULAR

## 2020-10-09 MED ORDER — CYANOCOBALAMIN 1000 MCG/ML IJ SOLN
INTRAMUSCULAR | Status: AC
  Filled 2020-10-09: qty 1

## 2020-10-09 NOTE — Patient Instructions (Signed)
Implanted Port Insertion, Care After This sheet gives you information about how to care for yourself after your procedure. Your health care provider may also give you more specific instructions. If you have problems or questions, contact your health care provider. What can I expect after the procedure? After the procedure, it is common to have:  Discomfort at the port insertion site.  Bruising on the skin over the port. This should improve over 3-4 days. Follow these instructions at home: Port care  After your port is placed, you will get a manufacturer's information card. The card has information about your port. Keep this card with you at all times.  Take care of the port as told by your health care provider. Ask your health care provider if you or a family member can get training for taking care of the port at home. A home health care nurse may also take care of the port.  Make sure to remember what type of port you have. Incision care  Follow instructions from your health care provider about how to take care of your port insertion site. Make sure you: ? Wash your hands with soap and water before and after you change your bandage (dressing). If soap and water are not available, use hand sanitizer. ? Change your dressing as told by your health care provider. ? Leave stitches (sutures), skin glue, or adhesive strips in place. These skin closures may need to stay in place for 2 weeks or longer. If adhesive strip edges start to loosen and curl up, you may trim the loose edges. Do not remove adhesive strips completely unless your health care provider tells you to do that.  Check your port insertion site every day for signs of infection. Check for: ? Redness, swelling, or pain. ? Fluid or blood. ? Warmth. ? Pus or a bad smell.      Activity  Return to your normal activities as told by your health care provider. Ask your health care provider what activities are safe for you.  Do not  lift anything that is heavier than 10 lb (4.5 kg), or the limit that you are told, until your health care provider says that it is safe. General instructions  Take over-the-counter and prescription medicines only as told by your health care provider.  Do not take baths, swim, or use a hot tub until your health care provider approves. Ask your health care provider if you may take showers. You may only be allowed to take sponge baths.  Do not drive for 24 hours if you were given a sedative during your procedure.  Wear a medical alert bracelet in case of an emergency. This will tell any health care providers that you have a port.  Keep all follow-up visits as told by your health care provider. This is important. Contact a health care provider if:  You cannot flush your port with saline as directed, or you cannot draw blood from the port.  You have a fever or chills.  You have redness, swelling, or pain around your port insertion site.  You have fluid or blood coming from your port insertion site.  Your port insertion site feels warm to the touch.  You have pus or a bad smell coming from the port insertion site. Get help right away if:  You have chest pain or shortness of breath.  You have bleeding from your port that you cannot control. Summary  Take care of the port as told by your   health care provider. Keep the manufacturer's information card with you at all times.  Change your dressing as told by your health care provider.  Contact a health care provider if you have a fever or chills or if you have redness, swelling, or pain around your port insertion site.  Keep all follow-up visits as told by your health care provider. This information is not intended to replace advice given to you by your health care provider. Make sure you discuss any questions you have with your health care provider. Document Revised: 12/29/2017 Document Reviewed: 12/29/2017 Elsevier Patient Education   2021 Elsevier Inc.  

## 2020-10-09 NOTE — Progress Notes (Signed)
[facility] Telephone:([phone]) [phone]   Fax:(336) [phone]  OFFICE PROGRESS NOTE  [name], MD [place] [address]  DIAGNOSIS: Stage IV (T3, N2, M1c) non-small cell lung cancer favoring adenocarcinoma presented with right upper lobe lung mass in addition to right lower lobe, left lower lobe in addition to right hilar and mediastinal lymphadenopathy in addition to metastatic brain lesions diagnosed in October 2021.  Molecular studies by [employer]:  [id number], 9.5%,   PRIOR THERAPY: SRS to 3 brain lesion under the care of Dr. Lisbeth Renshaw.  Scheduled for April 20, 2020  CURRENT THERAPY: Systemic chemotherapy with carboplatin for AUC of 5, Alimta 500 mg/M2 and Keytruda 200 mg IV every 3 weeks.  First dose April 24, 2020.  Status post 8 cycles.  Starting from cycle #5 the patient will be on maintenance treatment with Alimta and Keytruda every 3 weeks.  INTERVAL HISTORY: Nicolas Allen 65 y.o. male returns to the clinic today for follow-up visit accompanied by his wife.  The patient is feeling fine today with no concerning complaints.  He has been tolerating his maintenance treatment with Alimta and Keytruda fairly well.  He denied having any chest pain, shortness of breath, cough or hemoptysis.  He denied having any fever or chills.  He has no nausea, vomiting, diarrhea or constipation.  He is here today for evaluation before starting cycle #9.  MEDICAL HISTORY: Past Medical History:  Diagnosis Date  . Anemia 06/24/2012  . ANXIETY 02/03/2007  . Cancer ([place])   . Cervical disc disease 02/12/2012   S/p surgury jan 2013  . Erectile dysfunction 02/11/2011  . GERD (gastroesophageal reflux disease)   . GLUCOSE INTOLERANCE 01/04/2008  . HYPERLIPIDEMIA 02/03/2007  . HYPERTENSION 02/03/2007  . IBS (irritable bowel syndrome)   . Impaired glucose tolerance 02/11/2011  . Smoker 08/22/2014  . Substance abuse ([place]) 2001   sober for 16 yrs    ALLERGIES:  has  No Known Allergies.  MEDICATIONS:  Current Outpatient Medications  Medication Sig Dispense Refill  . amLODipine-benazepril (LOTREL) 10-20 MG capsule TAKE 1 CAPSULE BY MOUTH ONCE DAILY 90 capsule 3  . aspirin EC 81 MG tablet Take 81 mg by mouth daily. Swallow whole.    . b complex vitamins tablet Take 1 tablet by mouth daily.    . folic acid (FOLVITE) 1 MG tablet Take 1 tablet (1 mg total) by mouth daily. 30 tablet 4  . Garlic 8115 MG CAPS Take 1,000 mg by mouth daily.     Marland Kitchen ibuprofen (ADVIL) 200 MG tablet Take 200 mg by mouth every 6 (six) hours as needed.    . lidocaine-prilocaine (EMLA) cream Apply to the Port-A-Cath site 30-60 minutes before chemotherapy. 30 g 0  . Omega-3 Fatty Acids (FISH OIL PO) Take 1 capsule by mouth daily.     . pravastatin (PRAVACHOL) 40 MG tablet TAKE 1 TABLET BY MOUTH ONCE DAILY 90 tablet 3  . prochlorperazine (COMPAZINE) 10 MG tablet Take 1 tablet (10 mg total) by mouth every 6 (six) hours as needed for nausea or vomiting. 30 tablet 0  . vardenafil (LEVITRA) 20 MG tablet TAKE 1 TABLET BY MOUTH AS NEEDED FOR  ERECTILE  DYSFUNCTION 10 tablet 5  . Vitamin D, Ergocalciferol, (DRISDOL) 1.25 MG (50000 UNIT) CAPS capsule Take 1 capsule (50,000 Units total) by mouth every 7 (seven) days. 12 capsule 0   No current facility-administered medications for this visit.    SURGICAL  HISTORY:  Past Surgical History:  Procedure Laterality Date  . BRONCHIAL BIOPSY  03/30/2020   Procedure: BRONCHIAL BIOPSIES;  Surgeon: Garner Nash, DO;  Location: [facility];  Service: Pulmonary;;  . BRONCHIAL BRUSHINGS  03/30/2020   Procedure: BRONCHIAL BRUSHINGS;  Surgeon: Garner Nash, DO;  Location: [facility];  Service: Pulmonary;;  . BRONCHIAL NEEDLE ASPIRATION BIOPSY  03/30/2020   Procedure: BRONCHIAL NEEDLE ASPIRATION BIOPSIES;  Surgeon: Garner Nash, DO;  Location: [facility];  Service: Pulmonary;;  . BRONCHIAL WASHINGS  03/30/2020   Procedure: BRONCHIAL WASHINGS;   Surgeon: Garner Nash, DO;  Location: [facility];  Service: Pulmonary;;  . COLONOSCOPY  2007  . IR IMAGING GUIDED PORT INSERTION  04/23/2020  . neck fusion  2012   C4  . NO PAST SURGERIES    . VIDEO BRONCHOSCOPY WITH ENDOBRONCHIAL NAVIGATION N/A 03/30/2020   Procedure: VIDEO BRONCHOSCOPY WITH ENDOBRONCHIAL NAVIGATION;  Surgeon: Garner Nash, DO;  Location: [facility];  Service: Pulmonary;  Laterality: N/A;  . VIDEO BRONCHOSCOPY WITH ENDOBRONCHIAL ULTRASOUND N/A 03/30/2020   Procedure: VIDEO BRONCHOSCOPY WITH ENDOBRONCHIAL ULTRASOUND;  Surgeon: Garner Nash, DO;  Location: [facility];  Service: Pulmonary;  Laterality: N/A;    REVIEW OF SYSTEMS:  A comprehensive review of systems was negative.   PHYSICAL EXAMINATION: General appearance: alert, cooperative and no distress Head: Normocephalic, without obvious abnormality, atraumatic Neck: no adenopathy, no JVD, supple, symmetrical, trachea midline and thyroid not enlarged, symmetric, no tenderness/mass/nodules Lymph nodes: Cervical, supraclavicular, and axillary nodes normal. Resp: clear to auscultation bilaterally Back: symmetric, no curvature. ROM normal. No CVA tenderness. Cardio: regular rate and rhythm, S1, S2 normal, no murmur, click, rub or gallop GI: soft, non-tender; bowel sounds normal; no masses,  no organomegaly Extremities: extremities normal, atraumatic, no cyanosis or edema  ECOG PERFORMANCE STATUS: 1 - Symptomatic but completely ambulatory  Blood pressure (!) 111/57, pulse 93, temperature 99.4 F (37.4 C), temperature source Tympanic, resp. rate 20, height 6' (1.829 m), weight 160 lb 6.4 oz (72.8 kg), SpO2 100 %.  LABORATORY DATA: Lab Results  Component Value Date   WBC 8.0 10/09/2020   HGB 10.6 (L) 10/09/2020   HCT 31.0 (L) 10/09/2020   MCV 85.6 10/09/2020   PLT 178 10/09/2020      Chemistry      Component Value Date/Time   NA 137 09/18/2020 0859   K 3.9 09/18/2020 0859   CL 101 09/18/2020  0859   CO2 24 09/18/2020 0859   BUN 13 09/18/2020 0859   CREATININE 1.16 09/18/2020 0859   CREATININE 0.88 02/21/2020 1115      Component Value Date/Time   CALCIUM 8.8 (L) 09/18/2020 0859   ALKPHOS 52 09/18/2020 0859   AST 26 09/18/2020 0859   ALT 23 09/18/2020 0859   BILITOT 0.4 09/18/2020 0859       RADIOGRAPHIC STUDIES: No results found.  ASSESSMENT AND PLAN: This is a very pleasant 65 years old African-American male recently diagnosed with a stage IV (T3, N2, M1c)  non-small cell lung cancer, adenocarcinoma presented with right upper lobe lung mass in addition to right lower lobe, left lower lobe in addition to right hilar and mediastinal lymphadenopathy in addition to metastatic brain lesions diagnosed in October 2021. The patient has molecular studies by [employer] that showed no actionable mutations. He underwent SRS treatment to his brain lesion. The patient is currently undergoing systemic chemotherapy with carboplatin for AUC of 5, Alimta 500 mg/M2 and Keytruda 200 mg IV every 3  weeks status post 8 cycles.  Starting from cycle #5 the patient will be on maintenance treatment with Alimta and Keytruda every 3 weeks. The patient continues to tolerate his treatment fairly well with no concerning adverse effects. I recommended for him to proceed with cycle #9 today as planned. I will see him back for follow-up visit in 3 weeks for evaluation with repeat CT scan of the chest, abdomen pelvis for restaging of his disease. The patient was advised to call immediately if he has any concerning symptoms in the interval. The patient voices understanding of current disease status and treatment options and is in agreement with the current care plan.  All questions were answered. The patient knows to call the clinic with any problems, questions or concerns. We can certainly see the patient much sooner if necessary.  Disclaimer: This note was dictated with voice recognition software. Similar  sounding words can inadvertently be transcribed and may not be corrected upon review.

## 2020-10-09 NOTE — Patient Instructions (Signed)
[facility]  Discharge Instructions: Thank you for choosing [facility] to provide your oncology and hematology care.   If you have a lab appointment with the [facility], please go directly to the [facility] and check in at the registration area.   Wear comfortable clothing and clothing appropriate for easy access to any Portacath or PICC line.   We strive to give you quality time with your provider. You may need to reschedule your appointment if you arrive late (15 or more minutes).  Arriving late affects you and other patients whose appointments are after yours.  Also, if you miss three or more appointments without notifying the office, you may be dismissed from the clinic at the provider's discretion.      For prescription refill requests, have your pharmacy contact our office and allow 72 hours for refills to be completed.    Today you received the following chemotherapy and/or immunotherapy agents Keytruda and Alimta      To help prevent nausea and vomiting after your treatment, we encourage you to take your nausea medication as directed.  BELOW ARE SYMPTOMS THAT SHOULD BE REPORTED IMMEDIATELY: . *FEVER GREATER THAN 100.4 F (38 C) OR HIGHER . *CHILLS OR SWEATING . *NAUSEA AND VOMITING THAT IS NOT CONTROLLED WITH YOUR NAUSEA MEDICATION . *UNUSUAL SHORTNESS OF BREATH . *UNUSUAL BRUISING OR BLEEDING . *URINARY PROBLEMS (pain or burning when urinating, or frequent urination) . *BOWEL PROBLEMS (unusual diarrhea, constipation, pain near the anus) . TENDERNESS IN MOUTH AND THROAT WITH OR WITHOUT PRESENCE OF ULCERS (sore throat, sores in mouth, or a toothache) . UNUSUAL RASH, SWELLING OR PAIN  . UNUSUAL VAGINAL DISCHARGE OR ITCHING   Items with * indicate a potential emergency and should be followed up as soon as possible or go to the Emergency Department if any problems should occur.  Please show the CHEMOTHERAPY ALERT CARD or  IMMUNOTHERAPY ALERT CARD at check-in to the Emergency Department and triage nurse.  Should you have questions after your visit or need to cancel or reschedule your appointment, please contact [facility]  Dept: [phone]  and follow the prompts.  Office hours are 8:00 a.m. to 4:30 p.m. Monday - Friday. Please note that voicemails left after 4:00 p.m. may not be returned until the following business day.  We are closed weekends and major holidays. You have access to a nurse at all times for urgent questions. Please call the main number to the clinic Dept: [phone] and follow the prompts.   For any non-urgent questions, you may also contact your provider using MyChart. We now offer e-Visits for anyone 47 and older to request care online for non-urgent symptoms. For details visit [email or web address].   Also download the MyChart app! Go to the app store, search "MyChart", open the app, select , and log in with your MyChart username and password.  Due to Covid, a mask is required upon entering the hospital/clinic. If you do not have a mask, one will be given to you upon arrival. For doctor visits, patients may have 1 support person aged 13 or older with them. For treatment visits, patients cannot have anyone with them due to current Covid guidelines and our immunocompromised population.

## 2020-10-23 ENCOUNTER — Telehealth: Payer: Self-pay

## 2020-10-23 NOTE — Telephone Encounter (Signed)
Spoke with patient about meaningful use for Monday 5-16 appointment. Questions and concerns are addressed. Has an MRI on Thursday and Friday.

## 2020-10-25 ENCOUNTER — Telehealth: Payer: Self-pay | Admitting: Medical Oncology

## 2020-10-25 ENCOUNTER — Ambulatory Visit
Admission: RE | Admit: 2020-10-25 | Discharge: 2020-10-25 | Disposition: A | Discharge status: Home / Self Care | Payer: 59 | Source: Ambulatory Visit | Attending: Radiation Oncology | Admitting: Radiation Oncology

## 2020-10-25 DIAGNOSIS — C7949 Secondary malignant neoplasm of other parts of nervous system: Secondary | ICD-10-CM

## 2020-10-25 DIAGNOSIS — C7931 Secondary malignant neoplasm of brain: Secondary | ICD-10-CM

## 2020-10-25 MED ORDER — GADOBENATE DIMEGLUMINE 529 MG/ML IV SOLN
15.0000 mL | Freq: Once | INTRAVENOUS | Status: AC | PRN
  Administered 2020-10-25: 15 mL via INTRAVENOUS

## 2020-10-25 NOTE — Telephone Encounter (Signed)
  Itching  esp on his back started 2 days ago. "Welps on back". He said "it was rough last night trying to sleep with the  itching"  Took a benadryl today which "calmed it down"  Sister told him it was from the tomatoes he has been eating.  I instructed pt to continue benadryl prn itching and to use hydrocortisone cream to itchy area.

## 2020-10-26 ENCOUNTER — Other Ambulatory Visit: Payer: Self-pay

## 2020-10-26 ENCOUNTER — Ambulatory Visit (HOSPITAL_COMMUNITY)
Admission: RE | Admit: 2020-10-26 | Discharge: 2020-10-26 | Disposition: A | Discharge status: Home / Self Care | Payer: 59 | Source: Ambulatory Visit | Attending: Internal Medicine | Admitting: Internal Medicine

## 2020-10-26 ENCOUNTER — Encounter (HOSPITAL_COMMUNITY): Payer: Self-pay

## 2020-10-26 DIAGNOSIS — C3490 Malignant neoplasm of unspecified part of unspecified bronchus or lung: Secondary | ICD-10-CM | POA: Insufficient documentation

## 2020-10-26 MED ORDER — IOHEXOL 300 MG/ML  SOLN
100.0000 mL | Freq: Once | INTRAMUSCULAR | Status: AC | PRN
  Administered 2020-10-26: 75 mL via INTRAVENOUS

## 2020-10-26 MED ORDER — HEPARIN SOD (PORK) LOCK FLUSH 100 UNIT/ML IV SOLN
500.0000 [IU] | Freq: Once | INTRAVENOUS | Status: AC
  Administered 2020-10-26: 500 [IU] via INTRAVENOUS

## 2020-10-26 MED ORDER — HEPARIN SOD (PORK) LOCK FLUSH 100 UNIT/ML IV SOLN
INTRAVENOUS | Status: AC
  Filled 2020-10-26: qty 5

## 2020-10-29 ENCOUNTER — Ambulatory Visit
Admission: RE | Admit: 2020-10-29 | Discharge: 2020-10-29 | Disposition: A | Discharge status: Home / Self Care | Payer: 59 | Source: Ambulatory Visit | Attending: Radiation Oncology | Admitting: Radiation Oncology

## 2020-10-29 ENCOUNTER — Inpatient Hospital Stay: Payer: 59 | Attending: Radiation Oncology

## 2020-10-29 DIAGNOSIS — Z5112 Encounter for antineoplastic immunotherapy: Secondary | ICD-10-CM | POA: Insufficient documentation

## 2020-10-29 DIAGNOSIS — C3431 Malignant neoplasm of lower lobe, right bronchus or lung: Secondary | ICD-10-CM | POA: Insufficient documentation

## 2020-10-29 DIAGNOSIS — Z87891 Personal history of nicotine dependence: Secondary | ICD-10-CM | POA: Insufficient documentation

## 2020-10-29 DIAGNOSIS — C7931 Secondary malignant neoplasm of brain: Secondary | ICD-10-CM | POA: Insufficient documentation

## 2020-10-29 DIAGNOSIS — C3491 Malignant neoplasm of unspecified part of right bronchus or lung: Secondary | ICD-10-CM

## 2020-10-29 DIAGNOSIS — C3411 Malignant neoplasm of upper lobe, right bronchus or lung: Secondary | ICD-10-CM | POA: Insufficient documentation

## 2020-10-29 DIAGNOSIS — C3432 Malignant neoplasm of lower lobe, left bronchus or lung: Secondary | ICD-10-CM | POA: Insufficient documentation

## 2020-10-29 DIAGNOSIS — Z5111 Encounter for antineoplastic chemotherapy: Secondary | ICD-10-CM | POA: Insufficient documentation

## 2020-10-29 DIAGNOSIS — Z79899 Other long term (current) drug therapy: Secondary | ICD-10-CM | POA: Insufficient documentation

## 2020-10-29 NOTE — Progress Notes (Signed)
Radiation Oncology         [phone] ________________________________  Outpatient Follow Up - Conducted via telephone due to current COVID-19 concerns for limiting patient exposure  I spoke with the patient to conduct this consult visit via telephone to spare the patient unnecessary potential exposure in the healthcare setting during the current COVID-19 pandemic. The patient was notified in advance and was offered a [place] meeting to allow for face to face communication but unfortunately reported that they did not have the appropriate resources/technology to support such a visit and instead preferred to proceed with a telephone visit.   Name: Nicolas Allen        MRN: [id number]  Date of Service: 10/29/2020 DOB: Dec 07, 1955  WC:HENI, Hunt Oris, MD  Curt Bears, MD     REFERRING PHYSICIAN: Curt Bears, MD   DIAGNOSIS: The encounter diagnosis was Non-small cell carcinoma of right lung, stage 4 ([place]).   HISTORY OF PRESENT ILLNESS: Nicolas Allen is a 65 y.o. malewith a history of stage IV non-small cell lung cancer, adenocarcinoma involving multifocal disease in the lung and 3 brain metastases. He was diagnosed in the fall of 2021 and found to have metastatic disease in the lung and brain for which he has received stereotactic radiosurgery [facility]) with Dr. Lisbeth Renshaw and has continued on chemo/immunotherapy with Dr. Julien Nordmann. His MRI on 10/25/20 showed stability with some additional areas of improvement in the interval since February 2022. No new disease was noted. He's contacted by phone to discuss these results.    PREVIOUS RADIATION THERAPY:  04/20/20 SRS Treatment: Each site below was treated to 20 Gy in 1 fraction PTV1 Rt Cerebellum 63mm PTV2Mid cerebellum 67mm PTV3Lt Parietal 62mm    PAST MEDICAL HISTORY:  Past Medical History:  Diagnosis Date  . Anemia 06/24/2012  . ANXIETY 02/03/2007  . Cancer ([place])   . Cervical disc disease 02/12/2012   S/p surgury jan 2013  . Erectile  dysfunction 02/11/2011  . GERD (gastroesophageal reflux disease)   . GLUCOSE INTOLERANCE 01/04/2008  . HYPERLIPIDEMIA 02/03/2007  . HYPERTENSION 02/03/2007  . IBS (irritable bowel syndrome)   . Impaired glucose tolerance 02/11/2011  . Smoker 08/22/2014  . Substance abuse ([place]) 2001   sober for 16 yrs       PAST SURGICAL HISTORY: Past Surgical History:  Procedure Laterality Date  . BRONCHIAL BIOPSY  03/30/2020   Procedure: BRONCHIAL BIOPSIES;  Surgeon: Garner Nash, DO;  Location: [facility];  Service: Pulmonary;;  . BRONCHIAL BRUSHINGS  03/30/2020   Procedure: BRONCHIAL BRUSHINGS;  Surgeon: Garner Nash, DO;  Location: [facility];  Service: Pulmonary;;  . BRONCHIAL NEEDLE ASPIRATION BIOPSY  03/30/2020   Procedure: BRONCHIAL NEEDLE ASPIRATION BIOPSIES;  Surgeon: Garner Nash, DO;  Location: [facility];  Service: Pulmonary;;  . BRONCHIAL WASHINGS  03/30/2020   Procedure: BRONCHIAL WASHINGS;  Surgeon: Garner Nash, DO;  Location: [facility];  Service: Pulmonary;;  . COLONOSCOPY  2007  . IR IMAGING GUIDED PORT INSERTION  04/23/2020  . neck fusion  2012   C4  . NO PAST SURGERIES    . VIDEO BRONCHOSCOPY WITH ENDOBRONCHIAL NAVIGATION N/A 03/30/2020   Procedure: VIDEO BRONCHOSCOPY WITH ENDOBRONCHIAL NAVIGATION;  Surgeon: Garner Nash, DO;  Location: [facility];  Service: Pulmonary;  Laterality: N/A;  . VIDEO BRONCHOSCOPY WITH ENDOBRONCHIAL ULTRASOUND N/A 03/30/2020   Procedure: VIDEO BRONCHOSCOPY WITH ENDOBRONCHIAL ULTRASOUND;  Surgeon: Garner Nash, DO;  Location: [facility];  Service: Pulmonary;  Laterality: N/A;  FAMILY HISTORY:  Family History  Problem Relation Age of Onset  . Cancer Mother        esophagus  . Cancer Cousin   . Stroke Other   . Hypertension Other   . Colon cancer Neg Hx      SOCIAL HISTORY:  reports that he quit smoking about 8 months ago. His smoking use included cigarettes. He has a 45.00 pack-year smoking history. He has  quit using smokeless tobacco.  His smokeless tobacco use included chew. He reports that he does not drink alcohol and does not use drugs. The patient is married and resides in [place]. 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   ALLERGIES: Patient has no known allergies.   MEDICATIONS:  Current Outpatient Medications  Medication Sig Dispense Refill  . amLODipine-benazepril (LOTREL) 10-20 MG capsule TAKE 1 CAPSULE BY MOUTH ONCE DAILY 90 capsule 3  . aspirin EC 81 MG tablet Take 81 mg by mouth daily. Swallow whole.    . b complex vitamins tablet Take 1 tablet by mouth daily.    . folic acid (FOLVITE) 1 MG tablet Take 1 tablet (1 mg total) by mouth daily. 30 tablet 4  . Garlic 3382 MG CAPS Take 1,000 mg by mouth daily.     Marland Kitchen ibuprofen (ADVIL) 200 MG tablet Take 200 mg by mouth every 6 (six) hours as needed.    . lidocaine-prilocaine (EMLA) cream Apply to the Port-A-Cath site 30-60 minutes before chemotherapy. 30 g 0  . Omega-3 Fatty Acids (FISH OIL PO) Take 1 capsule by mouth daily.     . pravastatin (PRAVACHOL) 40 MG tablet TAKE 1 TABLET BY MOUTH ONCE DAILY 90 tablet 3  . prochlorperazine (COMPAZINE) 10 MG tablet Take 1 tablet (10 mg total) by mouth every 6 (six) hours as needed for nausea or vomiting. 30 tablet 0  . vardenafil (LEVITRA) 20 MG tablet TAKE 1 TABLET BY MOUTH AS NEEDED FOR  ERECTILE  DYSFUNCTION 10 tablet 5  . Vitamin D, Ergocalciferol, (DRISDOL) 1.25 MG (50000 UNIT) CAPS capsule Take 1 capsule (50,000 Units total) by mouth every 7 (seven) days. 12 capsule 0   No current facility-administered medications for this encounter.     REVIEW OF SYSTEMS: On review of systems the patient reports he is doing very well, he denies any headaches, visual or auditory disturbances. He denies seizures, dizziness, trouble with walking or movement. He denies chest pain or shortness of breath. He has been itching quite a bit and reports that he's had a truncal rash that  he thinks is due to eating acidic foods. No other complaints are verbalized.  PHYSICAL EXAM:  Unable to assess given encounter type.      ECOG = 1  0 - Asymptomatic (Fully active, able to carry on all predisease activities without restriction)  1 - Symptomatic but completely ambulatory (Restricted in physically strenuous activity but ambulatory and able to carry out work of a light or sedentary nature. For example, light housework, office work)  2 - Symptomatic, <50% in bed during the day (Ambulatory and capable of all self care but unable to carry out any work activities. Up and about more than 50% of waking hours)  3 - Symptomatic, >50% in bed, but not bedbound (Capable of only limited self-care, confined to bed or chair 50% or more of waking hours)  4 - Bedbound (Completely disabled. Cannot carry on any self-care. Totally confined to bed or chair)  5 - Death  Oken MM, Creech RH, Tormey DC, et al. [phone]). "Toxicity and response criteria of the [facility]". [place] Oncol. 5 (6): 649-55    LABORATORY DATA:  Lab Results  Component Value Date   WBC 8.0 10/09/2020   HGB 10.6 (L) 10/09/2020   HCT 31.0 (L) 10/09/2020   MCV 85.6 10/09/2020   PLT 178 10/09/2020   Lab Results  Component Value Date   NA 136 10/09/2020   K 3.9 10/09/2020   CL 100 10/09/2020   CO2 24 10/09/2020   Lab Results  Component Value Date   ALT 20 10/09/2020   AST 29 10/09/2020   ALKPHOS 50 10/09/2020   BILITOT 0.3 10/09/2020      RADIOGRAPHY: CT Chest W Contrast  Result Date: 10/26/2020 CLINICAL DATA:  Primary Cancer Type: Lung Imaging Indication: Assess response to therapy Interval therapy since last imaging? Yes Initial Cancer Diagnosis Date: 03/30/2020; Established by: Biopsy-proven Detailed Pathology: Stage IV non-small cell lung cancer favoring adenocarcinoma. Primary Tumor location: Right upper lobe.  Brain metastases. Surgeries: No. Chemotherapy: Yes; Ongoing?  Yes; Most recent administration: 10/09/2020 Immunotherapy?  Yes; Type: Keytruda; Ongoing? Yes Radiation therapy?  Yes; Date Range: 04/20/2020; Target: Brain EXAM: CT CHEST, ABDOMEN, AND PELVIS WITH CONTRAST TECHNIQUE: Multidetector CT imaging of the chest, abdomen and pelvis was performed following the standard protocol during bolus administration of intravenous contrast. CONTRAST:  49mL OMNIPAQUE IOHEXOL 300 MG/ML SOLN, additional oral enteric contrast COMPARISON:  Most recent CT chest, abdomen and pelvis 08/27/2020. 04/02/2020 PET-CT. FINDINGS: CT CHEST FINDINGS Cardiovascular: Right chest port catheter. Scattered aortic atherosclerosis. Normal heart size. No pericardial effusion. Mediastinum/Nodes: Redemonstrated enlargement of a high left paratracheal/superior mediastinal lymph node measuring 1.2 x 1.0 cm, previously FDG PET avid and enlarged when compared back to examination dating 06/25/2020 (series 2, image 16). No other enlarged mediastinal, hilar, or axillary lymph nodes. Thyroid gland, trachea, and esophagus demonstrate no significant findings. Lungs/Pleura: Mild centrilobular emphysema. No significant change in a spiculated mass of the posterior right upper lobe measuring approximately 4.1 x 1.4 cm (series 4, image 71). Unchanged mixed solid and ground-glass nodule of the dependent right lung base, measure 2.7 x 2.3 cm (series 4, image 88). Unchanged mixed solid and ground-glass spiculated nodule of the azygoesophageal recess of the right lower lobe, measuring 1.9 x 1.2 cm (series 4, image 71). Unchanged mixed solid and cystic lesion of the dependent left lung base, measuring approximately 3.4 x 2.8 cm (series 4, image 112). Multiple additional smaller ground-glass nodules, predominantly of the right lung, are unchanged. No pleural effusion or pneumothorax. Musculoskeletal: No chest wall mass or suspicious bone lesions identified. Unchanged small subcutaneous inclusion cysts about the anterior chest wall,  not previously FDG PET avid (e.g. Series 2, image 57). CT ABDOMEN PELVIS FINDINGS Hepatobiliary: No solid liver abnormality is seen. No gallstones, gallbladder wall thickening, or biliary dilatation. Pancreas: Unremarkable. No pancreatic ductal dilatation or surrounding inflammatory changes. Spleen: Normal in size without significant abnormality. Adrenals/Urinary Tract: Adrenal glands are unremarkable. Kidneys are normal, without renal calculi, solid lesion, or hydronephrosis. Bladder is unremarkable. Stomach/Bowel: Stomach is within normal limits. Appendix appears normal. No evidence of bowel wall thickening, distention, or inflammatory changes. Vascular/Lymphatic: Aortic atherosclerosis. No enlarged abdominal or pelvic lymph nodes. Reproductive: No mass or other abnormality. Other: No abdominal wall hernia or abnormality. No abdominopelvic ascites. Musculoskeletal: No acute or significant osseous findings. IMPRESSION: 1. No significant change in post treatment appearance of a spiculated mass of the posterior right upper lobe  measuring approximately 4.1 x 1.4 cm. 2. Unchanged mixed solid and cystic lesion of the dependent left lung base, measuring approximately 3.4 x 2.8 cm. 3. Multiple additional mixed solid and ground-glass and ground-glass nodules as detailed above are unchanged. These remain highly suspicious for synchronous multifocal adenocarcinoma. Attention on follow-up. 4. Redemonstrated enlargement of a high left paratracheal/superior mediastinal lymph node measuring 1.2 x 1.0 cm, previously FDG PET avid and enlarged when compared back to examination dating 06/25/2020. This is highly suspicious for nodal recurrence. 5. No evidence of metastatic disease within the abdomen or pelvis. 6. Emphysema. Aortic Atherosclerosis (ICD10-I70.0) and Emphysema (ICD10-J43.9). Electronically Signed   By: Eddie Candle M.D.   On: 10/26/2020 13:50   MR Brain W Wo Contrast  Result Date: 10/26/2020 CLINICAL DATA:   Follow-up metastatic non-small cell lung cancer. 3 brain metastasis treated with SRS on 04/20/2020. EXAM: MRI HEAD WITHOUT AND WITH CONTRAST TECHNIQUE: Multiplanar, multiecho pulse sequences of the brain and surrounding structures were obtained without and with intravenous contrast. CONTRAST:  35mL MULTIHANCE GADOBENATE DIMEGLUMINE 529 MG/ML IV SOLN COMPARISON:  07/24/2020 FINDINGS: BRAIN New Lesions: None. Larger lesions: None. Stable or Smaller lesions: 1. Unchanged 5 mm enhancing lesion in the cerebellar vermis (series 11, image 46). 2. At most faint residual enhancement of the punctate lateral right cerebellar lesion (series 11, image 46 and series 14, image 10). 3. The punctate residual left parietal lesion on the prior study is no longer visible. Other Brain findings: There is no evidence of an acute infarct, intracranial hemorrhage, midline shift, or extra-axial fluid collection. The ventricles and sulci are normal. T2 hyperintensities in the cerebral white matter bilaterally are unchanged and nonspecific but compatible with mild-to-moderate chronic small vessel ischemic disease. Vascular: Major intracranial vascular flow voids are preserved. Skull and upper cervical spine: Unremarkable bone marrow signal. Sinuses/Orbits: Unremarkable orbits. Unchanged small right maxillary sinus mucous retention cyst. Clear mastoid air cells. Other: Unchanged 2.4 cm enhancing right parotid mass. IMPRESSION: 1. Stable to decreased size of treated brain metastases. 2. No evidence of new intracranial metastases. 3. Unchanged right parotid mass. Electronically Signed   By: Logan Bores M.D.   On: 10/26/2020 08:56   CT Abdomen Pelvis W Contrast  Result Date: 10/26/2020 CLINICAL DATA:  Primary Cancer Type: Lung Imaging Indication: Assess response to therapy Interval therapy since last imaging? Yes Initial Cancer Diagnosis Date: 03/30/2020; Established by: Biopsy-proven Detailed Pathology: Stage IV non-small cell lung cancer  favoring adenocarcinoma. Primary Tumor location: Right upper lobe.  Brain metastases. Surgeries: No. Chemotherapy: Yes; Ongoing? Yes; Most recent administration: 10/09/2020 Immunotherapy?  Yes; Type: Keytruda; Ongoing? Yes Radiation therapy?  Yes; Date Range: 04/20/2020; Target: Brain EXAM: CT CHEST, ABDOMEN, AND PELVIS WITH CONTRAST TECHNIQUE: Multidetector CT imaging of the chest, abdomen and pelvis was performed following the standard protocol during bolus administration of intravenous contrast. CONTRAST:  71mL OMNIPAQUE IOHEXOL 300 MG/ML SOLN, additional oral enteric contrast COMPARISON:  Most recent CT chest, abdomen and pelvis 08/27/2020. 04/02/2020 PET-CT. FINDINGS: CT CHEST FINDINGS Cardiovascular: Right chest port catheter. Scattered aortic atherosclerosis. Normal heart size. No pericardial effusion. Mediastinum/Nodes: Redemonstrated enlargement of a high left paratracheal/superior mediastinal lymph node measuring 1.2 x 1.0 cm, previously FDG PET avid and enlarged when compared back to examination dating 06/25/2020 (series 2, image 16). No other enlarged mediastinal, hilar, or axillary lymph nodes. Thyroid gland, trachea, and esophagus demonstrate no significant findings. Lungs/Pleura: Mild centrilobular emphysema. No significant change in a spiculated mass of the posterior right upper lobe measuring approximately 4.1 x  1.4 cm (series 4, image 71). Unchanged mixed solid and ground-glass nodule of the dependent right lung base, measure 2.7 x 2.3 cm (series 4, image 88). Unchanged mixed solid and ground-glass spiculated nodule of the azygoesophageal recess of the right lower lobe, measuring 1.9 x 1.2 cm (series 4, image 71). Unchanged mixed solid and cystic lesion of the dependent left lung base, measuring approximately 3.4 x 2.8 cm (series 4, image 112). Multiple additional smaller ground-glass nodules, predominantly of the right lung, are unchanged. No pleural effusion or pneumothorax. Musculoskeletal: No  chest wall mass or suspicious bone lesions identified. Unchanged small subcutaneous inclusion cysts about the anterior chest wall, not previously FDG PET avid (e.g. Series 2, image 57). CT ABDOMEN PELVIS FINDINGS Hepatobiliary: No solid liver abnormality is seen. No gallstones, gallbladder wall thickening, or biliary dilatation. Pancreas: Unremarkable. No pancreatic ductal dilatation or surrounding inflammatory changes. Spleen: Normal in size without significant abnormality. Adrenals/Urinary Tract: Adrenal glands are unremarkable. Kidneys are normal, without renal calculi, solid lesion, or hydronephrosis. Bladder is unremarkable. Stomach/Bowel: Stomach is within normal limits. Appendix appears normal. No evidence of bowel wall thickening, distention, or inflammatory changes. Vascular/Lymphatic: Aortic atherosclerosis. No enlarged abdominal or pelvic lymph nodes. Reproductive: No mass or other abnormality. Other: No abdominal wall hernia or abnormality. No abdominopelvic ascites. Musculoskeletal: No acute or significant osseous findings. IMPRESSION: 1. No significant change in post treatment appearance of a spiculated mass of the posterior right upper lobe measuring approximately 4.1 x 1.4 cm. 2. Unchanged mixed solid and cystic lesion of the dependent left lung base, measuring approximately 3.4 x 2.8 cm. 3. Multiple additional mixed solid and ground-glass and ground-glass nodules as detailed above are unchanged. These remain highly suspicious for synchronous multifocal adenocarcinoma. Attention on follow-up. 4. Redemonstrated enlargement of a high left paratracheal/superior mediastinal lymph node measuring 1.2 x 1.0 cm, previously FDG PET avid and enlarged when compared back to examination dating 06/25/2020. This is highly suspicious for nodal recurrence. 5. No evidence of metastatic disease within the abdomen or pelvis. 6. Emphysema. Aortic Atherosclerosis (ICD10-I70.0) and Emphysema (ICD10-J43.9). Electronically  Signed   By: Eddie Candle M.D.   On: 10/26/2020 13:50       IMPRESSION/PLAN: 1. Stage IV non-small cell lung cancer, adenocarcinoma involving multifocal disease in the lung and brain metastases.The patient is doing well and radiographically is stable in the brain. I suspect Dr. Julien Nordmann will follow him closely based on his CT scan results as well. He will meet Dr. Julien Nordmann tomorrow to consider another cycle of Alimta/Keytruda. We will continue in surveillance for his brain every 3 months, and after 1 years, increasing those intervals. He will let us know if he has questions or concerns prior to his next visit.  2. Right parotid nodule. The patient continues to be asymptomatic and we will follow these expectantly. 3. Rash. He will be evaluated tomorrow for this as well and continues Benadryl and Zantac.  Given current concerns for patient exposure during the COVID-19 pandemic, this encounter was conducted via telephone.  The patient has provided two factor identification and has given verbal consent for this type of encounter and has been advised to only accept a meeting of this type in a secure network environment. The time spent during this encounter was 35 minutes including preparation, discussion, and coordination of the patient's care. The attendants for this meeting include Hayden Pedro  and DAESEAN LAZARZ and his wife Zakry Caso. During the encounter,  Hayden Pedro was located at [facility]  [place] [facility] Department.  Advay NESHAWN AIRD was located in the car with his wife Shelvy Perazzo.     Carola Rhine, PAC

## 2020-10-30 ENCOUNTER — Other Ambulatory Visit: Payer: 59

## 2020-10-30 ENCOUNTER — Other Ambulatory Visit: Payer: Self-pay

## 2020-10-30 ENCOUNTER — Inpatient Hospital Stay: Payer: 59

## 2020-10-30 ENCOUNTER — Encounter: Payer: Self-pay | Admitting: Internal Medicine

## 2020-10-30 ENCOUNTER — Inpatient Hospital Stay (HOSPITAL_BASED_OUTPATIENT_CLINIC_OR_DEPARTMENT_OTHER): Payer: 59 | Admitting: Internal Medicine

## 2020-10-30 VITALS — BP 123/65 | HR 84 | Temp 97.0°F | Resp 16 | Ht 72.0 in | Wt 161.3 lb

## 2020-10-30 DIAGNOSIS — Z95828 Presence of other vascular implants and grafts: Secondary | ICD-10-CM

## 2020-10-30 DIAGNOSIS — I10 Essential (primary) hypertension: Secondary | ICD-10-CM

## 2020-10-30 DIAGNOSIS — C3432 Malignant neoplasm of lower lobe, left bronchus or lung: Secondary | ICD-10-CM | POA: Diagnosis not present

## 2020-10-30 DIAGNOSIS — Z5111 Encounter for antineoplastic chemotherapy: Secondary | ICD-10-CM | POA: Diagnosis present

## 2020-10-30 DIAGNOSIS — C3491 Malignant neoplasm of unspecified part of right bronchus or lung: Secondary | ICD-10-CM | POA: Diagnosis not present

## 2020-10-30 DIAGNOSIS — Z87891 Personal history of nicotine dependence: Secondary | ICD-10-CM | POA: Diagnosis not present

## 2020-10-30 DIAGNOSIS — C3431 Malignant neoplasm of lower lobe, right bronchus or lung: Secondary | ICD-10-CM | POA: Diagnosis not present

## 2020-10-30 DIAGNOSIS — Z5112 Encounter for antineoplastic immunotherapy: Secondary | ICD-10-CM

## 2020-10-30 DIAGNOSIS — C7931 Secondary malignant neoplasm of brain: Secondary | ICD-10-CM | POA: Diagnosis not present

## 2020-10-30 DIAGNOSIS — C3411 Malignant neoplasm of upper lobe, right bronchus or lung: Secondary | ICD-10-CM | POA: Diagnosis present

## 2020-10-30 DIAGNOSIS — Z79899 Other long term (current) drug therapy: Secondary | ICD-10-CM | POA: Diagnosis not present

## 2020-10-30 LAB — CBC WITH DIFFERENTIAL (CANCER CENTER ONLY)
Abs Immature Granulocytes: 0.14 10*3/uL — ABNORMAL HIGH (ref 0.00–0.07)
Basophils Absolute: 0.1 10*3/uL (ref 0.0–0.1)
Basophils Relative: 1 %
Eosinophils Absolute: 0.1 10*3/uL (ref 0.0–0.5)
Eosinophils Relative: 2 %
HCT: 32.9 % — ABNORMAL LOW (ref 39.0–52.0)
Hemoglobin: 11.1 g/dL — ABNORMAL LOW (ref 13.0–17.0)
Immature Granulocytes: 2 %
Lymphocytes Relative: 35 %
Lymphs Abs: 2.5 10*3/uL (ref 0.7–4.0)
MCH: 29.1 pg (ref 26.0–34.0)
MCHC: 33.7 g/dL (ref 30.0–36.0)
MCV: 86.1 fL (ref 80.0–100.0)
Monocytes Absolute: 0.8 10*3/uL (ref 0.1–1.0)
Monocytes Relative: 12 %
Neutro Abs: 3.5 10*3/uL (ref 1.7–7.7)
Neutrophils Relative %: 48 %
Platelet Count: 226 10*3/uL (ref 150–400)
RBC: 3.82 MIL/uL — ABNORMAL LOW (ref 4.22–5.81)
RDW: 14.8 % (ref 11.5–15.5)
WBC Count: 7.1 10*3/uL (ref 4.0–10.5)
nRBC: 0 % (ref 0.0–0.2)

## 2020-10-30 LAB — CMP (CANCER CENTER ONLY)
ALT: 59 U/L — ABNORMAL HIGH (ref 0–44)
AST: 42 U/L — ABNORMAL HIGH (ref 15–41)
Albumin: 3.3 g/dL — ABNORMAL LOW (ref 3.5–5.0)
Alkaline Phosphatase: 56 U/L (ref 38–126)
Anion gap: 11 (ref 5–15)
BUN: 14 mg/dL (ref 8–23)
CO2: 25 mmol/L (ref 22–32)
Calcium: 9.1 mg/dL (ref 8.9–10.3)
Chloride: 102 mmol/L (ref 98–111)
Creatinine: 0.99 mg/dL (ref 0.61–1.24)
GFR, Estimated: >60 mL/min (ref >60)
Glucose, Bld: 135 mg/dL — ABNORMAL HIGH (ref 70–99)
Potassium: 3.8 mmol/L (ref 3.5–5.1)
Sodium: 138 mmol/L (ref 135–145)
Total Bilirubin: 0.2 mg/dL — ABNORMAL LOW (ref 0.3–1.2)
Total Protein: 7.3 g/dL (ref 6.5–8.1)

## 2020-10-30 LAB — TSH: TSH: 2.305 u[IU]/mL (ref 0.320–4.118)

## 2020-10-30 MED ORDER — SODIUM CHLORIDE 0.9 % IV SOLN
500.0000 mg/m2 | Freq: Once | INTRAVENOUS | Status: AC
  Administered 2020-10-30: 900 mg via INTRAVENOUS
  Filled 2020-10-30: qty 20

## 2020-10-30 MED ORDER — HEPARIN SOD (PORK) LOCK FLUSH 100 UNIT/ML IV SOLN
500.0000 [IU] | Freq: Once | INTRAVENOUS | Status: AC | PRN
  Administered 2020-10-30: 500 [IU]
  Filled 2020-10-30: qty 5

## 2020-10-30 MED ORDER — PROCHLORPERAZINE MALEATE 10 MG PO TABS
10.0000 mg | ORAL_TABLET | Freq: Once | ORAL | Status: AC
Start: 2020-10-30 — End: 2020-10-30
  Administered 2020-10-30: 10 mg via ORAL

## 2020-10-30 MED ORDER — SODIUM CHLORIDE 0.9 % IV SOLN
Freq: Once | INTRAVENOUS | Status: AC
  Filled 2020-10-30: qty 250

## 2020-10-30 MED ORDER — SODIUM CHLORIDE 0.9 % IV SOLN
200.0000 mg | Freq: Once | INTRAVENOUS | Status: AC
  Administered 2020-10-30: 200 mg via INTRAVENOUS
  Filled 2020-10-30: qty 8

## 2020-10-30 MED ORDER — SODIUM CHLORIDE 0.9% FLUSH
10.0000 mL | Freq: Once | INTRAVENOUS | Status: AC
  Administered 2020-10-30: 10 mL
  Filled 2020-10-30: qty 10

## 2020-10-30 MED ORDER — PROCHLORPERAZINE MALEATE 10 MG PO TABS
ORAL_TABLET | ORAL | Status: AC
  Filled 2020-10-30: qty 1

## 2020-10-30 MED ORDER — SODIUM CHLORIDE 0.9% FLUSH
10.0000 mL | INTRAVENOUS | Status: DC | PRN
  Administered 2020-10-30: 10 mL
  Filled 2020-10-30: qty 10

## 2020-10-30 NOTE — Progress Notes (Signed)
[place] Telephone:([phone]) [phone]   [phone]  OFFICE PROGRESS NOTE  [name], MD [place] [address]  DIAGNOSIS: Stage IV (T3, N2, M1c) non-small cell lung cancer favoring adenocarcinoma presented with right upper lobe lung mass in addition to right lower lobe, left lower lobe in addition to right hilar and mediastinal lymphadenopathy in addition to metastatic brain lesions diagnosed in October 2021.  Molecular studies by [employer]:  [id number], 9.5%,   PRIOR THERAPY: SRS to 3 brain lesion under the care of Dr. Lisbeth Renshaw.  Scheduled for April 20, 2020  CURRENT THERAPY: Systemic chemotherapy with carboplatin for AUC of 5, Alimta 500 mg/M2 and Keytruda 200 mg IV every 3 weeks.  First dose April 24, 2020.  Status post 9 cycles.  Starting from cycle #5 the patient will be on maintenance treatment with Alimta and Keytruda every 3 weeks.  INTERVAL HISTORY: Nicolas Allen 65 y.o. male returns to the clinic today for follow-up visit accompanied by his wife.  The patient is feeling fine today with no concerning complaints.  He denied having any current chest pain, shortness of breath, cough or hemoptysis.  He denied having any nausea, vomiting, diarrhea or constipation.  He has no headache or visual changes.  He has no significant weight loss or night sweats.  He denied having any fever or chills.  He continues to tolerate his maintenance treatment with Alimta and Keytruda fairly well.  The patient had repeat CT scan of the chest, abdomen and pelvis performed recently and he is here today for evaluation and discussion of his scan results.  MEDICAL HISTORY: Past Medical History:  Diagnosis Date  . Anemia 06/24/2012  . ANXIETY 02/03/2007  . Cancer ([place])   . Cervical disc disease 02/12/2012   S/p surgury jan 2013  . Erectile dysfunction 02/11/2011  . GERD (gastroesophageal reflux disease)   . GLUCOSE INTOLERANCE 01/04/2008  .  HYPERLIPIDEMIA 02/03/2007  . HYPERTENSION 02/03/2007  . IBS (irritable bowel syndrome)   . Impaired glucose tolerance 02/11/2011  . Smoker 08/22/2014  . Substance abuse ([place]) 2001   sober for 16 yrs    ALLERGIES:  has No Known Allergies.  MEDICATIONS:  Current Outpatient Medications  Medication Sig Dispense Refill  . amLODipine-benazepril (LOTREL) 10-20 MG capsule TAKE 1 CAPSULE BY MOUTH ONCE DAILY 90 capsule 3  . aspirin EC 81 MG tablet Take 81 mg by mouth daily. Swallow whole.    . b complex vitamins tablet Take 1 tablet by mouth daily.    . folic acid (FOLVITE) 1 MG tablet Take 1 tablet (1 mg total) by mouth daily. 30 tablet 4  . Garlic 6659 MG CAPS Take 1,000 mg by mouth daily.     Marland Kitchen ibuprofen (ADVIL) 200 MG tablet Take 200 mg by mouth every 6 (six) hours as needed.    . lidocaine-prilocaine (EMLA) cream Apply to the Port-A-Cath site 30-60 minutes before chemotherapy. 30 g 0  . Omega-3 Fatty Acids (FISH OIL PO) Take 1 capsule by mouth daily.     . pravastatin (PRAVACHOL) 40 MG tablet TAKE 1 TABLET BY MOUTH ONCE DAILY 90 tablet 3  . prochlorperazine (COMPAZINE) 10 MG tablet Take 1 tablet (10 mg total) by mouth every 6 (six) hours as needed for nausea or vomiting. 30 tablet 0  . vardenafil (LEVITRA) 20 MG tablet TAKE 1 TABLET BY MOUTH AS NEEDED FOR  ERECTILE  DYSFUNCTION 10 tablet 5  . Vitamin  D, Ergocalciferol, (DRISDOL) 1.25 MG (50000 UNIT) CAPS capsule Take 1 capsule (50,000 Units total) by mouth every 7 (seven) days. 12 capsule 0   No current facility-administered medications for this visit.    SURGICAL HISTORY:  Past Surgical History:  Procedure Laterality Date  . BRONCHIAL BIOPSY  03/30/2020   Procedure: BRONCHIAL BIOPSIES;  Surgeon: Garner Nash, DO;  Location: [facility];  Service: Pulmonary;;  . BRONCHIAL BRUSHINGS  03/30/2020   Procedure: BRONCHIAL BRUSHINGS;  Surgeon: Garner Nash, DO;  Location: [facility];  Service: Pulmonary;;  . BRONCHIAL NEEDLE ASPIRATION  BIOPSY  03/30/2020   Procedure: BRONCHIAL NEEDLE ASPIRATION BIOPSIES;  Surgeon: Garner Nash, DO;  Location: [facility];  Service: Pulmonary;;  . BRONCHIAL WASHINGS  03/30/2020   Procedure: BRONCHIAL WASHINGS;  Surgeon: Garner Nash, DO;  Location: [facility];  Service: Pulmonary;;  . COLONOSCOPY  2007  . IR IMAGING GUIDED PORT INSERTION  04/23/2020  . neck fusion  2012   C4  . NO PAST SURGERIES    . VIDEO BRONCHOSCOPY WITH ENDOBRONCHIAL NAVIGATION N/A 03/30/2020   Procedure: VIDEO BRONCHOSCOPY WITH ENDOBRONCHIAL NAVIGATION;  Surgeon: Garner Nash, DO;  Location: [facility];  Service: Pulmonary;  Laterality: N/A;  . VIDEO BRONCHOSCOPY WITH ENDOBRONCHIAL ULTRASOUND N/A 03/30/2020   Procedure: VIDEO BRONCHOSCOPY WITH ENDOBRONCHIAL ULTRASOUND;  Surgeon: Garner Nash, DO;  Location: [facility];  Service: Pulmonary;  Laterality: N/A;    REVIEW OF SYSTEMS:  Constitutional: negative Eyes: negative Ears, nose, mouth, throat, and face: negative Respiratory: negative Cardiovascular: negative Gastrointestinal: negative Genitourinary:negative Integument/breast: negative Hematologic/lymphatic: negative Musculoskeletal:negative Neurological: negative Behavioral/Psych: negative Endocrine: negative Allergic/Immunologic: negative   PHYSICAL EXAMINATION: General appearance: alert, cooperative and no distress Head: Normocephalic, without obvious abnormality, atraumatic Neck: no adenopathy, no JVD, supple, symmetrical, trachea midline and thyroid not enlarged, symmetric, no tenderness/mass/nodules Lymph nodes: Cervical, supraclavicular, and axillary nodes normal. Resp: clear to auscultation bilaterally Back: symmetric, no curvature. ROM normal. No CVA tenderness. Cardio: regular rate and rhythm, S1, S2 normal, no murmur, click, rub or gallop GI: soft, non-tender; bowel sounds normal; no masses,  no organomegaly Extremities: extremities normal, atraumatic, no cyanosis or  edema Neurologic: Alert and oriented X 3, normal strength and tone. Normal symmetric reflexes. Normal coordination and gait  ECOG PERFORMANCE STATUS: 1 - Symptomatic but completely ambulatory  Blood pressure 123/65, pulse 84, temperature (!) 97 F (36.1 C), temperature source Tympanic, resp. rate 16, height 6' (1.829 m), weight 161 lb 4.8 oz (73.2 kg), SpO2 100 %.  LABORATORY DATA: Lab Results  Component Value Date   WBC 7.1 10/30/2020   HGB 11.1 (L) 10/30/2020   HCT 32.9 (L) 10/30/2020   MCV 86.1 10/30/2020   PLT 226 10/30/2020      Chemistry      Component Value Date/Time   NA 136 10/09/2020 0846   K 3.9 10/09/2020 0846   CL 100 10/09/2020 0846   CO2 24 10/09/2020 0846   BUN 15 10/09/2020 0846   CREATININE 1.09 10/09/2020 0846   CREATININE 0.88 02/21/2020 1115      Component Value Date/Time   CALCIUM 9.1 10/09/2020 0846   ALKPHOS 50 10/09/2020 0846   AST 29 10/09/2020 0846   ALT 20 10/09/2020 0846   BILITOT 0.3 10/09/2020 0846       RADIOGRAPHIC STUDIES: CT Chest W Contrast  Result Date: 10/26/2020 CLINICAL DATA:  Primary Cancer Type: Lung Imaging Indication: Assess response to therapy Interval therapy since last imaging? Yes Initial Cancer Diagnosis Date: 03/30/2020; Lelon Mast  by: Biopsy-proven Detailed Pathology: Stage IV non-small cell lung cancer favoring adenocarcinoma. Primary Tumor location: Right upper lobe.  Brain metastases. Surgeries: No. Chemotherapy: Yes; Ongoing? Yes; Most recent administration: 10/09/2020 Immunotherapy?  Yes; Type: Keytruda; Ongoing? Yes Radiation therapy?  Yes; Date Range: 04/20/2020; Target: Brain EXAM: CT CHEST, ABDOMEN, AND PELVIS WITH CONTRAST TECHNIQUE: Multidetector CT imaging of the chest, abdomen and pelvis was performed following the standard protocol during bolus administration of intravenous contrast. CONTRAST:  74mL OMNIPAQUE IOHEXOL 300 MG/ML SOLN, additional oral enteric contrast COMPARISON:  Most recent CT chest, abdomen  and pelvis 08/27/2020. 04/02/2020 PET-CT. FINDINGS: CT CHEST FINDINGS Cardiovascular: Right chest port catheter. Scattered aortic atherosclerosis. Normal heart size. No pericardial effusion. Mediastinum/Nodes: Redemonstrated enlargement of a high left paratracheal/superior mediastinal lymph node measuring 1.2 x 1.0 cm, previously FDG PET avid and enlarged when compared back to examination dating 06/25/2020 (series 2, image 16). No other enlarged mediastinal, hilar, or axillary lymph nodes. Thyroid gland, trachea, and esophagus demonstrate no significant findings. Lungs/Pleura: Mild centrilobular emphysema. No significant change in a spiculated mass of the posterior right upper lobe measuring approximately 4.1 x 1.4 cm (series 4, image 71). Unchanged mixed solid and ground-glass nodule of the dependent right lung base, measure 2.7 x 2.3 cm (series 4, image 88). Unchanged mixed solid and ground-glass spiculated nodule of the azygoesophageal recess of the right lower lobe, measuring 1.9 x 1.2 cm (series 4, image 71). Unchanged mixed solid and cystic lesion of the dependent left lung base, measuring approximately 3.4 x 2.8 cm (series 4, image 112). Multiple additional smaller ground-glass nodules, predominantly of the right lung, are unchanged. No pleural effusion or pneumothorax. Musculoskeletal: No chest wall mass or suspicious bone lesions identified. Unchanged small subcutaneous inclusion cysts about the anterior chest wall, not previously FDG PET avid (e.g. Series 2, image 57). CT ABDOMEN PELVIS FINDINGS Hepatobiliary: No solid liver abnormality is seen. No gallstones, gallbladder wall thickening, or biliary dilatation. Pancreas: Unremarkable. No pancreatic ductal dilatation or surrounding inflammatory changes. Spleen: Normal in size without significant abnormality. Adrenals/Urinary Tract: Adrenal glands are unremarkable. Kidneys are normal, without renal calculi, solid lesion, or hydronephrosis. Bladder is  unremarkable. Stomach/Bowel: Stomach is within normal limits. Appendix appears normal. No evidence of bowel wall thickening, distention, or inflammatory changes. Vascular/Lymphatic: Aortic atherosclerosis. No enlarged abdominal or pelvic lymph nodes. Reproductive: No mass or other abnormality. Other: No abdominal wall hernia or abnormality. No abdominopelvic ascites. Musculoskeletal: No acute or significant osseous findings. IMPRESSION: 1. No significant change in post treatment appearance of a spiculated mass of the posterior right upper lobe measuring approximately 4.1 x 1.4 cm. 2. Unchanged mixed solid and cystic lesion of the dependent left lung base, measuring approximately 3.4 x 2.8 cm. 3. Multiple additional mixed solid and ground-glass and ground-glass nodules as detailed above are unchanged. These remain highly suspicious for synchronous multifocal adenocarcinoma. Attention on follow-up. 4. Redemonstrated enlargement of a high left paratracheal/superior mediastinal lymph node measuring 1.2 x 1.0 cm, previously FDG PET avid and enlarged when compared back to examination dating 06/25/2020. This is highly suspicious for nodal recurrence. 5. No evidence of metastatic disease within the abdomen or pelvis. 6. Emphysema. Aortic Atherosclerosis (ICD10-I70.0) and Emphysema (ICD10-J43.9). Electronically Signed   By: Eddie Candle M.D.   On: 10/26/2020 13:50   MR Brain W Wo Contrast  Result Date: 10/26/2020 CLINICAL DATA:  Follow-up metastatic non-small cell lung cancer. 3 brain metastasis treated with SRS on 04/20/2020. EXAM: MRI HEAD WITHOUT AND WITH CONTRAST TECHNIQUE: Multiplanar, multiecho pulse sequences  of the brain and surrounding structures were obtained without and with intravenous contrast. CONTRAST:  54mL MULTIHANCE GADOBENATE DIMEGLUMINE 529 MG/ML IV SOLN COMPARISON:  07/24/2020 FINDINGS: BRAIN New Lesions: None. Larger lesions: None. Stable or Smaller lesions: 1. Unchanged 5 mm enhancing lesion in the  cerebellar vermis (series 11, image 46). 2. At most faint residual enhancement of the punctate lateral right cerebellar lesion (series 11, image 46 and series 14, image 10). 3. The punctate residual left parietal lesion on the prior study is no longer visible. Other Brain findings: There is no evidence of an acute infarct, intracranial hemorrhage, midline shift, or extra-axial fluid collection. The ventricles and sulci are normal. T2 hyperintensities in the cerebral white matter bilaterally are unchanged and nonspecific but compatible with mild-to-moderate chronic small vessel ischemic disease. Vascular: Major intracranial vascular flow voids are preserved. Skull and upper cervical spine: Unremarkable bone marrow signal. Sinuses/Orbits: Unremarkable orbits. Unchanged small right maxillary sinus mucous retention cyst. Clear mastoid air cells. Other: Unchanged 2.4 cm enhancing right parotid mass. IMPRESSION: 1. Stable to decreased size of treated brain metastases. 2. No evidence of new intracranial metastases. 3. Unchanged right parotid mass. Electronically Signed   By: Logan Bores M.D.   On: 10/26/2020 08:56   CT Abdomen Pelvis W Contrast  Result Date: 10/26/2020 CLINICAL DATA:  Primary Cancer Type: Lung Imaging Indication: Assess response to therapy Interval therapy since last imaging? Yes Initial Cancer Diagnosis Date: 03/30/2020; Established by: Biopsy-proven Detailed Pathology: Stage IV non-small cell lung cancer favoring adenocarcinoma. Primary Tumor location: Right upper lobe.  Brain metastases. Surgeries: No. Chemotherapy: Yes; Ongoing? Yes; Most recent administration: 10/09/2020 Immunotherapy?  Yes; Type: Keytruda; Ongoing? Yes Radiation therapy?  Yes; Date Range: 04/20/2020; Target: Brain EXAM: CT CHEST, ABDOMEN, AND PELVIS WITH CONTRAST TECHNIQUE: Multidetector CT imaging of the chest, abdomen and pelvis was performed following the standard protocol during bolus administration of intravenous contrast.  CONTRAST:  59mL OMNIPAQUE IOHEXOL 300 MG/ML SOLN, additional oral enteric contrast COMPARISON:  Most recent CT chest, abdomen and pelvis 08/27/2020. 04/02/2020 PET-CT. FINDINGS: CT CHEST FINDINGS Cardiovascular: Right chest port catheter. Scattered aortic atherosclerosis. Normal heart size. No pericardial effusion. Mediastinum/Nodes: Redemonstrated enlargement of a high left paratracheal/superior mediastinal lymph node measuring 1.2 x 1.0 cm, previously FDG PET avid and enlarged when compared back to examination dating 06/25/2020 (series 2, image 16). No other enlarged mediastinal, hilar, or axillary lymph nodes. Thyroid gland, trachea, and esophagus demonstrate no significant findings. Lungs/Pleura: Mild centrilobular emphysema. No significant change in a spiculated mass of the posterior right upper lobe measuring approximately 4.1 x 1.4 cm (series 4, image 71). Unchanged mixed solid and ground-glass nodule of the dependent right lung base, measure 2.7 x 2.3 cm (series 4, image 88). Unchanged mixed solid and ground-glass spiculated nodule of the azygoesophageal recess of the right lower lobe, measuring 1.9 x 1.2 cm (series 4, image 71). Unchanged mixed solid and cystic lesion of the dependent left lung base, measuring approximately 3.4 x 2.8 cm (series 4, image 112). Multiple additional smaller ground-glass nodules, predominantly of the right lung, are unchanged. No pleural effusion or pneumothorax. Musculoskeletal: No chest wall mass or suspicious bone lesions identified. Unchanged small subcutaneous inclusion cysts about the anterior chest wall, not previously FDG PET avid (e.g. Series 2, image 57). CT ABDOMEN PELVIS FINDINGS Hepatobiliary: No solid liver abnormality is seen. No gallstones, gallbladder wall thickening, or biliary dilatation. Pancreas: Unremarkable. No pancreatic ductal dilatation or surrounding inflammatory changes. Spleen: Normal in size without significant abnormality. Adrenals/Urinary Tract:  Adrenal glands are unremarkable. Kidneys are normal, without renal calculi, solid lesion, or hydronephrosis. Bladder is unremarkable. Stomach/Bowel: Stomach is within normal limits. Appendix appears normal. No evidence of bowel wall thickening, distention, or inflammatory changes. Vascular/Lymphatic: Aortic atherosclerosis. No enlarged abdominal or pelvic lymph nodes. Reproductive: No mass or other abnormality. Other: No abdominal wall hernia or abnormality. No abdominopelvic ascites. Musculoskeletal: No acute or significant osseous findings. IMPRESSION: 1. No significant change in post treatment appearance of a spiculated mass of the posterior right upper lobe measuring approximately 4.1 x 1.4 cm. 2. Unchanged mixed solid and cystic lesion of the dependent left lung base, measuring approximately 3.4 x 2.8 cm. 3. Multiple additional mixed solid and ground-glass and ground-glass nodules as detailed above are unchanged. These remain highly suspicious for synchronous multifocal adenocarcinoma. Attention on follow-up. 4. Redemonstrated enlargement of a high left paratracheal/superior mediastinal lymph node measuring 1.2 x 1.0 cm, previously FDG PET avid and enlarged when compared back to examination dating 06/25/2020. This is highly suspicious for nodal recurrence. 5. No evidence of metastatic disease within the abdomen or pelvis. 6. Emphysema. Aortic Atherosclerosis (ICD10-I70.0) and Emphysema (ICD10-J43.9). Electronically Signed   By: Eddie Candle M.D.   On: 10/26/2020 13:50    ASSESSMENT AND PLAN: This is a very pleasant 65 years old African-American male recently diagnosed with a stage IV (T3, N2, M1c)  non-small cell lung cancer, adenocarcinoma presented with right upper lobe lung mass in addition to right lower lobe, left lower lobe in addition to right hilar and mediastinal lymphadenopathy in addition to metastatic brain lesions diagnosed in October 2021. The patient has molecular studies by [employer] that  showed no actionable mutations. He underwent SRS treatment to his brain lesion. The patient is currently undergoing systemic chemotherapy with carboplatin for AUC of 5, Alimta 500 mg/M2 and Keytruda 200 mg IV every 3 weeks status post 9 cycles.  Starting from cycle #5 the patient will be on maintenance treatment with Alimta and Keytruda every 3 weeks. He tolerated the last cycle of his treatment well with no concerning adverse effects. The patient had repeat CT scan of the chest, abdomen pelvis performed recently.  I personally and independently reviewed the scans and discussed the results with the patient and his wife.  His scan showed no concerning findings for disease progression except for a slightly enlarging high left paratracheal/superior mediastinal lymph node that need to be monitored closely on the upcoming imaging studies. I recommended for the patient to continue his current treatment with maintenance Alimta and Keytruda and he will proceed with cycle #10 today. I will see him back for follow-up visit in 3 weeks for evaluation before the next cycle of his treatment. The patient was advised to call immediately if he has any concerning symptoms in the interval. The patient voices understanding of current disease status and treatment options and is in agreement with the current care plan.  All questions were answered. The patient knows to call the clinic with any problems, questions or concerns. We can certainly see the patient much sooner if necessary.  Disclaimer: This note was dictated with voice recognition software. Similar sounding words can inadvertently be transcribed and may not be corrected upon review.

## 2020-10-30 NOTE — Patient Instructions (Signed)
[facility]  Discharge Instructions: Thank you for choosing [facility] to provide your oncology and hematology care.   If you have a lab appointment with the Cancer Center, please go directly to the Cancer Center and check in at the registration area.   Wear comfortable clothing and clothing appropriate for easy access to any Portacath or PICC line.   We strive to give you quality time with your provider. You may need to reschedule your appointment if you arrive late (15 or more minutes).  Arriving late affects you and other patients whose appointments are after yours.  Also, if you miss three or more appointments without notifying the office, you may be dismissed from the clinic at the provider's discretion.      For prescription refill requests, have your pharmacy contact our office and allow 72 hours for refills to be completed.    Today you received the following chemotherapy and/or immunotherapy agents: Keytruda/Alimta.      To help prevent nausea and vomiting after your treatment, we encourage you to take your nausea medication as directed.  BELOW ARE SYMPTOMS THAT SHOULD BE REPORTED IMMEDIATELY: *FEVER GREATER THAN 100.4 F (38 C) OR HIGHER *CHILLS OR SWEATING *NAUSEA AND VOMITING THAT IS NOT CONTROLLED WITH YOUR NAUSEA MEDICATION *UNUSUAL SHORTNESS OF BREATH *UNUSUAL BRUISING OR BLEEDING *URINARY PROBLEMS (pain or burning when urinating, or frequent urination) *BOWEL PROBLEMS (unusual diarrhea, constipation, pain near the anus) TENDERNESS IN MOUTH AND THROAT WITH OR WITHOUT PRESENCE OF ULCERS (sore throat, sores in mouth, or a toothache) UNUSUAL RASH, SWELLING OR PAIN  UNUSUAL VAGINAL DISCHARGE OR ITCHING   Items with * indicate a potential emergency and should be followed up as soon as possible or go to the Emergency Department if any problems should occur.  Please show the CHEMOTHERAPY ALERT CARD or IMMUNOTHERAPY ALERT CARD at  check-in to the Emergency Department and triage nurse.  Should you have questions after your visit or need to cancel or reschedule your appointment, please contact [facility]  Dept: [phone]  and follow the prompts.  Office hours are 8:00 a.m. to 4:30 p.m. Monday - Friday. Please note that voicemails left after 4:00 p.m. may not be returned until the following business day.  We are closed weekends and major holidays. You have access to a nurse at all times for urgent questions. Please call the main number to the clinic Dept: [phone] and follow the prompts.   For any non-urgent questions, you may also contact your provider using MyChart. We now offer e-Visits for anyone 18 and older to request care online for non-urgent symptoms. For details visit [email or web address].   Also download the MyChart app! Go to the app store, search "MyChart", open the app, select [facility], and log in with your MyChart username and password.  Due to Covid, a mask is required upon entering the hospital/clinic. If you do not have a mask, one will be given to you upon arrival. For doctor visits, patients may have 1 support person aged 18 or older with them. For treatment visits, patients cannot have anyone with them due to current Covid guidelines and our immunocompromised population.   

## 2020-11-01 ENCOUNTER — Telehealth: Payer: Self-pay | Admitting: Internal Medicine

## 2020-11-01 NOTE — Telephone Encounter (Signed)
Scheduled per los. Called and left msg. Mailed printout  °

## 2020-11-14 ENCOUNTER — Other Ambulatory Visit: Payer: Self-pay | Admitting: Radiation Therapy

## 2020-11-14 DIAGNOSIS — C7931 Secondary malignant neoplasm of brain: Secondary | ICD-10-CM

## 2020-11-15 ENCOUNTER — Other Ambulatory Visit: Payer: Self-pay | Admitting: Radiation Therapy

## 2020-11-15 DIAGNOSIS — C7931 Secondary malignant neoplasm of brain: Secondary | ICD-10-CM

## 2020-11-15 NOTE — Progress Notes (Signed)
Order entered for port access and de-access the day of August brain MRI at [facility].   Mont Dutton R.T.(R)(T) Radiation Special Procedures Navigator

## 2020-11-20 ENCOUNTER — Other Ambulatory Visit: Payer: Self-pay

## 2020-11-20 ENCOUNTER — Other Ambulatory Visit: Payer: 59

## 2020-11-20 ENCOUNTER — Inpatient Hospital Stay: Payer: 59

## 2020-11-20 ENCOUNTER — Inpatient Hospital Stay: Payer: 59 | Attending: Internal Medicine | Admitting: Internal Medicine

## 2020-11-20 ENCOUNTER — Encounter: Payer: Self-pay | Admitting: Internal Medicine

## 2020-11-20 VITALS — BP 111/63 | HR 81 | Temp 96.8°F | Resp 18 | Ht 72.0 in | Wt 163.6 lb

## 2020-11-20 DIAGNOSIS — C3411 Malignant neoplasm of upper lobe, right bronchus or lung: Secondary | ICD-10-CM | POA: Diagnosis present

## 2020-11-20 DIAGNOSIS — R059 Cough, unspecified: Secondary | ICD-10-CM | POA: Diagnosis not present

## 2020-11-20 DIAGNOSIS — C3491 Malignant neoplasm of unspecified part of right bronchus or lung: Secondary | ICD-10-CM

## 2020-11-20 DIAGNOSIS — Z87891 Personal history of nicotine dependence: Secondary | ICD-10-CM | POA: Diagnosis not present

## 2020-11-20 DIAGNOSIS — Z79899 Other long term (current) drug therapy: Secondary | ICD-10-CM | POA: Diagnosis not present

## 2020-11-20 DIAGNOSIS — Z95828 Presence of other vascular implants and grafts: Secondary | ICD-10-CM

## 2020-11-20 DIAGNOSIS — I10 Essential (primary) hypertension: Secondary | ICD-10-CM | POA: Insufficient documentation

## 2020-11-20 DIAGNOSIS — Z5111 Encounter for antineoplastic chemotherapy: Secondary | ICD-10-CM | POA: Diagnosis present

## 2020-11-20 DIAGNOSIS — E785 Hyperlipidemia, unspecified: Secondary | ICD-10-CM | POA: Insufficient documentation

## 2020-11-20 DIAGNOSIS — C7931 Secondary malignant neoplasm of brain: Secondary | ICD-10-CM | POA: Insufficient documentation

## 2020-11-20 DIAGNOSIS — Z5112 Encounter for antineoplastic immunotherapy: Secondary | ICD-10-CM | POA: Insufficient documentation

## 2020-11-20 DIAGNOSIS — C3431 Malignant neoplasm of lower lobe, right bronchus or lung: Secondary | ICD-10-CM | POA: Insufficient documentation

## 2020-11-20 DIAGNOSIS — C3432 Malignant neoplasm of lower lobe, left bronchus or lung: Secondary | ICD-10-CM | POA: Insufficient documentation

## 2020-11-20 DIAGNOSIS — R945 Abnormal results of liver function studies: Secondary | ICD-10-CM

## 2020-11-20 LAB — CMP (CANCER CENTER ONLY)
ALT: 120 U/L — ABNORMAL HIGH (ref 0–44)
AST: 77 U/L — ABNORMAL HIGH (ref 15–41)
Albumin: 3.3 g/dL — ABNORMAL LOW (ref 3.5–5.0)
Alkaline Phosphatase: 50 U/L (ref 38–126)
Anion gap: 12 (ref 5–15)
BUN: 13 mg/dL (ref 8–23)
CO2: 23 mmol/L (ref 22–32)
Calcium: 9.2 mg/dL (ref 8.9–10.3)
Chloride: 103 mmol/L (ref 98–111)
Creatinine: 1.07 mg/dL (ref 0.61–1.24)
GFR, Estimated: >60 mL/min (ref >60)
Glucose, Bld: 145 mg/dL — ABNORMAL HIGH (ref 70–99)
Potassium: 4.1 mmol/L (ref 3.5–5.1)
Sodium: 138 mmol/L (ref 135–145)
Total Bilirubin: 0.3 mg/dL (ref 0.3–1.2)
Total Protein: 7.3 g/dL (ref 6.5–8.1)

## 2020-11-20 LAB — CBC WITH DIFFERENTIAL (CANCER CENTER ONLY)
Abs Immature Granulocytes: 0.04 10*3/uL (ref 0.00–0.07)
Basophils Absolute: 0 10*3/uL (ref 0.0–0.1)
Basophils Relative: 0 %
Eosinophils Absolute: 0.2 10*3/uL (ref 0.0–0.5)
Eosinophils Relative: 3 %
HCT: 32.5 % — ABNORMAL LOW (ref 39.0–52.0)
Hemoglobin: 11 g/dL — ABNORMAL LOW (ref 13.0–17.0)
Immature Granulocytes: 1 %
Lymphocytes Relative: 32 %
Lymphs Abs: 2.2 10*3/uL (ref 0.7–4.0)
MCH: 28.7 pg (ref 26.0–34.0)
MCHC: 33.8 g/dL (ref 30.0–36.0)
MCV: 84.9 fL (ref 80.0–100.0)
Monocytes Absolute: 0.7 10*3/uL (ref 0.1–1.0)
Monocytes Relative: 11 %
Neutro Abs: 3.6 10*3/uL (ref 1.7–7.7)
Neutrophils Relative %: 53 %
Platelet Count: 148 10*3/uL — ABNORMAL LOW (ref 150–400)
RBC: 3.83 MIL/uL — ABNORMAL LOW (ref 4.22–5.81)
RDW: 15.8 % — ABNORMAL HIGH (ref 11.5–15.5)
WBC Count: 6.9 10*3/uL (ref 4.0–10.5)
nRBC: 0 % (ref 0.0–0.2)

## 2020-11-20 LAB — TSH: TSH: 2.333 u[IU]/mL (ref 0.320–4.118)

## 2020-11-20 MED ORDER — PROCHLORPERAZINE MALEATE 10 MG PO TABS
ORAL_TABLET | ORAL | Status: AC
  Filled 2020-11-20: qty 1

## 2020-11-20 MED ORDER — PROCHLORPERAZINE MALEATE 10 MG PO TABS: 10.0000 mg | ORAL_TABLET | Freq: Four times a day (QID) | ORAL | 0 refills | Status: AC | PRN

## 2020-11-20 MED ORDER — SODIUM CHLORIDE 0.9 % IV SOLN
200.0000 mg | Freq: Once | INTRAVENOUS | Status: AC
  Administered 2020-11-20: 200 mg via INTRAVENOUS
  Filled 2020-11-20: qty 8

## 2020-11-20 MED ORDER — FOLIC ACID 1 MG PO TABS: 1.0000 mg | ORAL_TABLET | Freq: Every day | ORAL | 4 refills | Status: DC

## 2020-11-20 MED ORDER — SODIUM CHLORIDE 0.9 % IV SOLN
Freq: Once | INTRAVENOUS | Status: AC
  Filled 2020-11-20: qty 250

## 2020-11-20 MED ORDER — HEPARIN SOD (PORK) LOCK FLUSH 100 UNIT/ML IV SOLN
500.0000 [IU] | Freq: Once | INTRAVENOUS | Status: AC | PRN
  Administered 2020-11-20: 500 [IU]
  Filled 2020-11-20: qty 5

## 2020-11-20 MED ORDER — PROCHLORPERAZINE MALEATE 10 MG PO TABS
10.0000 mg | ORAL_TABLET | Freq: Once | ORAL | Status: AC
  Administered 2020-11-20: 10 mg via ORAL

## 2020-11-20 MED ORDER — SODIUM CHLORIDE 0.9% FLUSH
10.0000 mL | INTRAVENOUS | Status: DC | PRN
  Administered 2020-11-20: 10 mL
  Filled 2020-11-20: qty 10

## 2020-11-20 MED ORDER — SODIUM CHLORIDE 0.9% FLUSH
10.0000 mL | Freq: Once | INTRAVENOUS | Status: AC
  Administered 2020-11-20: 10 mL
  Filled 2020-11-20: qty 10

## 2020-11-20 MED ORDER — SODIUM CHLORIDE 0.9 % IV SOLN
500.0000 mg/m2 | Freq: Once | INTRAVENOUS | Status: AC
  Administered 2020-11-20: 900 mg via INTRAVENOUS
  Filled 2020-11-20: qty 20

## 2020-11-20 NOTE — Patient Instructions (Signed)
[facility]  Discharge Instructions: Thank you for choosing [facility] to provide your oncology and hematology care.   If you have a lab appointment with the Cancer Center, please go directly to the Cancer Center and check in at the registration area.   Wear comfortable clothing and clothing appropriate for easy access to any Portacath or PICC line.   We strive to give you quality time with your provider. You may need to reschedule your appointment if you arrive late (15 or more minutes).  Arriving late affects you and other patients whose appointments are after yours.  Also, if you miss three or more appointments without notifying the office, you may be dismissed from the clinic at the provider's discretion.      For prescription refill requests, have your pharmacy contact our office and allow 72 hours for refills to be completed.    Today you received the following chemotherapy and/or immunotherapy agents: Keytruda/Alimta.      To help prevent nausea and vomiting after your treatment, we encourage you to take your nausea medication as directed.  BELOW ARE SYMPTOMS THAT SHOULD BE REPORTED IMMEDIATELY: *FEVER GREATER THAN 100.4 F (38 C) OR HIGHER *CHILLS OR SWEATING *NAUSEA AND VOMITING THAT IS NOT CONTROLLED WITH YOUR NAUSEA MEDICATION *UNUSUAL SHORTNESS OF BREATH *UNUSUAL BRUISING OR BLEEDING *URINARY PROBLEMS (pain or burning when urinating, or frequent urination) *BOWEL PROBLEMS (unusual diarrhea, constipation, pain near the anus) TENDERNESS IN MOUTH AND THROAT WITH OR WITHOUT PRESENCE OF ULCERS (sore throat, sores in mouth, or a toothache) UNUSUAL RASH, SWELLING OR PAIN  UNUSUAL VAGINAL DISCHARGE OR ITCHING   Items with * indicate a potential emergency and should be followed up as soon as possible or go to the Emergency Department if any problems should occur.  Please show the CHEMOTHERAPY ALERT CARD or IMMUNOTHERAPY ALERT CARD at  check-in to the Emergency Department and triage nurse.  Should you have questions after your visit or need to cancel or reschedule your appointment, please contact [facility]  Dept: [phone]  and follow the prompts.  Office hours are 8:00 a.m. to 4:30 p.m. Monday - Friday. Please note that voicemails left after 4:00 p.m. may not be returned until the following business day.  We are closed weekends and major holidays. You have access to a nurse at all times for urgent questions. Please call the main number to the clinic Dept: [phone] and follow the prompts.   For any non-urgent questions, you may also contact your provider using MyChart. We now offer e-Visits for anyone 18 and older to request care online for non-urgent symptoms. For details visit [email or web address].   Also download the MyChart app! Go to the app store, search "MyChart", open the app, select , and log in with your MyChart username and password.  Due to Covid, a mask is required upon entering the hospital/clinic. If you do not have a mask, one will be given to you upon arrival. For doctor visits, patients may have 1 support person aged 18 or older with them. For treatment visits, patients cannot have anyone with them due to current Covid guidelines and our immunocompromised population.   

## 2020-11-20 NOTE — Progress Notes (Signed)
Per Curt Bears, MD ok to treat with ALT of 120.

## 2020-11-20 NOTE — Progress Notes (Signed)
[place] [phone]   [phone]  OFFICE PROGRESS NOTE  [name], MD [place] [address]  DIAGNOSIS: Stage IV (T3, N2, M1c) non-small cell lung cancer favoring adenocarcinoma presented with right upper lobe lung mass in addition to right lower lobe, left lower lobe in addition to right hilar and mediastinal lymphadenopathy in addition to metastatic brain lesions diagnosed in October 2021.  Molecular studies by [employer]:  [id number], 9.5%,   PRIOR THERAPY: SRS to 3 brain lesion under the care of Dr. Lisbeth Renshaw.  Scheduled for April 20, 2020  CURRENT THERAPY: Systemic chemotherapy with carboplatin for AUC of 5, Alimta 500 mg/M2 and Keytruda 200 mg IV every 3 weeks.  First dose April 24, 2020.  Status post 10 cycles.  Starting from cycle #5 the patient will be on maintenance treatment with Alimta and Keytruda every 3 weeks.  INTERVAL HISTORY: Nicolas Allen 65 y.o. male returns to the clinic today for follow-up visit.  The patient is feeling fine today with no concerning complaints.  He denied having any current chest pain, shortness of breath, cough or hemoptysis.  He denied having any fever or chills.  He has no nausea, vomiting, diarrhea or constipation.  He has no headache or visual changes.  He continues to tolerate his systemic chemotherapy fairly well.  He is here today for evaluation before starting cycle #11.  MEDICAL HISTORY: Past Medical History:  Diagnosis Date  . Anemia 06/24/2012  . ANXIETY 02/03/2007  . Cancer ([place])   . Cervical disc disease 02/12/2012   S/p surgury jan 2013  . Erectile dysfunction 02/11/2011  . GERD (gastroesophageal reflux disease)   . GLUCOSE INTOLERANCE 01/04/2008  . HYPERLIPIDEMIA 02/03/2007  . HYPERTENSION 02/03/2007  . IBS (irritable bowel syndrome)   . Impaired glucose tolerance 02/11/2011  . Smoker 08/22/2014  . Substance abuse ([place]) 2001   sober for 16 yrs    ALLERGIES:   has No Known Allergies.  MEDICATIONS:  Current Outpatient Medications  Medication Sig Dispense Refill  . cholecalciferol (VITAMIN D3) 25 MCG (1000 UNIT) tablet Take 1,000 Units by mouth daily.    . polyethylene glycol (MIRALAX / GLYCOLAX) 17 g packet Take 17 g by mouth daily as needed.    Marland Kitchen amLODipine-benazepril (LOTREL) 10-20 MG capsule TAKE 1 CAPSULE BY MOUTH ONCE DAILY 90 capsule 3  . aspirin EC 81 MG tablet Take 81 mg by mouth daily. Swallow whole.    . b complex vitamins tablet Take 1 tablet by mouth daily.    . folic acid (FOLVITE) 1 MG tablet Take 1 tablet (1 mg total) by mouth daily. 30 tablet 4  . Garlic 3299 MG CAPS Take 1,000 mg by mouth daily.     Marland Kitchen ibuprofen (ADVIL) 200 MG tablet Take 200 mg by mouth every 6 (six) hours as needed.    . lidocaine-prilocaine (EMLA) cream Apply to the Port-A-Cath site 30-60 minutes before chemotherapy. 30 g 0  . Omega-3 Fatty Acids (FISH OIL PO) Take 1 capsule by mouth daily.     . pravastatin (PRAVACHOL) 40 MG tablet TAKE 1 TABLET BY MOUTH ONCE DAILY 90 tablet 3  . prochlorperazine (COMPAZINE) 10 MG tablet Take 1 tablet (10 mg total) by mouth every 6 (six) hours as needed for nausea or vomiting. 30 tablet 0  . vardenafil (LEVITRA) 20 MG tablet TAKE 1 TABLET BY MOUTH AS NEEDED FOR  ERECTILE  DYSFUNCTION 10 tablet 5  No current facility-administered medications for this visit.    SURGICAL HISTORY:  Past Surgical History:  Procedure Laterality Date  . BRONCHIAL BIOPSY  03/30/2020   Procedure: BRONCHIAL BIOPSIES;  Surgeon: Garner Nash, DO;  Location: [facility];  Service: Pulmonary;;  . BRONCHIAL BRUSHINGS  03/30/2020   Procedure: BRONCHIAL BRUSHINGS;  Surgeon: Garner Nash, DO;  Location: [facility];  Service: Pulmonary;;  . BRONCHIAL NEEDLE ASPIRATION BIOPSY  03/30/2020   Procedure: BRONCHIAL NEEDLE ASPIRATION BIOPSIES;  Surgeon: Garner Nash, DO;  Location: [facility];  Service: Pulmonary;;  . BRONCHIAL WASHINGS   03/30/2020   Procedure: BRONCHIAL WASHINGS;  Surgeon: Garner Nash, DO;  Location: [facility];  Service: Pulmonary;;  . COLONOSCOPY  2007  . IR IMAGING GUIDED PORT INSERTION  04/23/2020  . neck fusion  2012   C4  . NO PAST SURGERIES    . VIDEO BRONCHOSCOPY WITH ENDOBRONCHIAL NAVIGATION N/A 03/30/2020   Procedure: VIDEO BRONCHOSCOPY WITH ENDOBRONCHIAL NAVIGATION;  Surgeon: Garner Nash, DO;  Location: [facility];  Service: Pulmonary;  Laterality: N/A;  . VIDEO BRONCHOSCOPY WITH ENDOBRONCHIAL ULTRASOUND N/A 03/30/2020   Procedure: VIDEO BRONCHOSCOPY WITH ENDOBRONCHIAL ULTRASOUND;  Surgeon: Garner Nash, DO;  Location: [facility];  Service: Pulmonary;  Laterality: N/A;    REVIEW OF SYSTEMS:  A comprehensive review of systems was negative.   PHYSICAL EXAMINATION: General appearance: alert, cooperative and no distress Head: Normocephalic, without obvious abnormality, atraumatic Neck: no adenopathy, no JVD, supple, symmetrical, trachea midline and thyroid not enlarged, symmetric, no tenderness/mass/nodules Lymph nodes: Cervical, supraclavicular, and axillary nodes normal. Resp: clear to auscultation bilaterally Back: symmetric, no curvature. ROM normal. No CVA tenderness. Cardio: regular rate and rhythm, S1, S2 normal, no murmur, click, rub or gallop GI: soft, non-tender; bowel sounds normal; no masses,  no organomegaly Extremities: extremities normal, atraumatic, no cyanosis or edema  ECOG PERFORMANCE STATUS: 1 - Symptomatic but completely ambulatory  Blood pressure 111/63, pulse 81, temperature (!) 96.8 F (36 C), temperature source Tympanic, resp. rate 18, height 6' (1.829 m), weight 163 lb 9.6 oz (74.2 kg), SpO2 100 %.  LABORATORY DATA: Lab Results  Component Value Date   WBC 6.9 11/20/2020   HGB 11.0 (L) 11/20/2020   HCT 32.5 (L) 11/20/2020   MCV 84.9 11/20/2020   PLT 148 (L) 11/20/2020      Chemistry      Component Value Date/Time   NA 138 10/30/2020 0815    K 3.8 10/30/2020 0815   CL 102 10/30/2020 0815   CO2 25 10/30/2020 0815   BUN 14 10/30/2020 0815   CREATININE 0.99 10/30/2020 0815   CREATININE 0.88 02/21/2020 1115      Component Value Date/Time   CALCIUM 9.1 10/30/2020 0815   ALKPHOS 56 10/30/2020 0815   AST 42 (H) 10/30/2020 0815   ALT 59 (H) 10/30/2020 0815   BILITOT 0.2 (L) 10/30/2020 0815       RADIOGRAPHIC STUDIES: CT Chest W Contrast  Result Date: 10/26/2020 CLINICAL DATA:  Primary Cancer Type: Lung Imaging Indication: Assess response to therapy Interval therapy since last imaging? Yes Initial Cancer Diagnosis Date: 03/30/2020; Established by: Biopsy-proven Detailed Pathology: Stage IV non-small cell lung cancer favoring adenocarcinoma. Primary Tumor location: Right upper lobe.  Brain metastases. Surgeries: No. Chemotherapy: Yes; Ongoing? Yes; Most recent administration: 10/09/2020 Immunotherapy?  Yes; Type: Keytruda; Ongoing? Yes Radiation therapy?  Yes; Date Range: 04/20/2020; Target: Brain EXAM: CT CHEST, ABDOMEN, AND PELVIS WITH CONTRAST TECHNIQUE: Multidetector CT imaging of the chest, abdomen  and pelvis was performed following the standard protocol during bolus administration of intravenous contrast. CONTRAST:  22mL OMNIPAQUE IOHEXOL 300 MG/ML SOLN, additional oral enteric contrast COMPARISON:  Most recent CT chest, abdomen and pelvis 08/27/2020. 04/02/2020 PET-CT. FINDINGS: CT CHEST FINDINGS Cardiovascular: Right chest port catheter. Scattered aortic atherosclerosis. Normal heart size. No pericardial effusion. Mediastinum/Nodes: Redemonstrated enlargement of a high left paratracheal/superior mediastinal lymph node measuring 1.2 x 1.0 cm, previously FDG PET avid and enlarged when compared back to examination dating 06/25/2020 (series 2, image 16). No other enlarged mediastinal, hilar, or axillary lymph nodes. Thyroid gland, trachea, and esophagus demonstrate no significant findings. Lungs/Pleura: Mild centrilobular emphysema. No  significant change in a spiculated mass of the posterior right upper lobe measuring approximately 4.1 x 1.4 cm (series 4, image 71). Unchanged mixed solid and ground-glass nodule of the dependent right lung base, measure 2.7 x 2.3 cm (series 4, image 88). Unchanged mixed solid and ground-glass spiculated nodule of the azygoesophageal recess of the right lower lobe, measuring 1.9 x 1.2 cm (series 4, image 71). Unchanged mixed solid and cystic lesion of the dependent left lung base, measuring approximately 3.4 x 2.8 cm (series 4, image 112). Multiple additional smaller ground-glass nodules, predominantly of the right lung, are unchanged. No pleural effusion or pneumothorax. Musculoskeletal: No chest wall mass or suspicious bone lesions identified. Unchanged small subcutaneous inclusion cysts about the anterior chest wall, not previously FDG PET avid (e.g. Series 2, image 57). CT ABDOMEN PELVIS FINDINGS Hepatobiliary: No solid liver abnormality is seen. No gallstones, gallbladder wall thickening, or biliary dilatation. Pancreas: Unremarkable. No pancreatic ductal dilatation or surrounding inflammatory changes. Spleen: Normal in size without significant abnormality. Adrenals/Urinary Tract: Adrenal glands are unremarkable. Kidneys are normal, without renal calculi, solid lesion, or hydronephrosis. Bladder is unremarkable. Stomach/Bowel: Stomach is within normal limits. Appendix appears normal. No evidence of bowel wall thickening, distention, or inflammatory changes. Vascular/Lymphatic: Aortic atherosclerosis. No enlarged abdominal or pelvic lymph nodes. Reproductive: No mass or other abnormality. Other: No abdominal wall hernia or abnormality. No abdominopelvic ascites. Musculoskeletal: No acute or significant osseous findings. IMPRESSION: 1. No significant change in post treatment appearance of a spiculated mass of the posterior right upper lobe measuring approximately 4.1 x 1.4 cm. 2. Unchanged mixed solid and cystic  lesion of the dependent left lung base, measuring approximately 3.4 x 2.8 cm. 3. Multiple additional mixed solid and ground-glass and ground-glass nodules as detailed above are unchanged. These remain highly suspicious for synchronous multifocal adenocarcinoma. Attention on follow-up. 4. Redemonstrated enlargement of a high left paratracheal/superior mediastinal lymph node measuring 1.2 x 1.0 cm, previously FDG PET avid and enlarged when compared back to examination dating 06/25/2020. This is highly suspicious for nodal recurrence. 5. No evidence of metastatic disease within the abdomen or pelvis. 6. Emphysema. Aortic Atherosclerosis (ICD10-I70.0) and Emphysema (ICD10-J43.9). Electronically Signed   By: Eddie Candle M.D.   On: 10/26/2020 13:50   MR Brain W Wo Contrast  Result Date: 10/26/2020 CLINICAL DATA:  Follow-up metastatic non-small cell lung cancer. 3 brain metastasis treated with SRS on 04/20/2020. EXAM: MRI HEAD WITHOUT AND WITH CONTRAST TECHNIQUE: Multiplanar, multiecho pulse sequences of the brain and surrounding structures were obtained without and with intravenous contrast. CONTRAST:  15mL MULTIHANCE GADOBENATE DIMEGLUMINE 529 MG/ML IV SOLN COMPARISON:  07/24/2020 FINDINGS: BRAIN New Lesions: None. Larger lesions: None. Stable or Smaller lesions: 1. Unchanged 5 mm enhancing lesion in the cerebellar vermis (series 11, image 46). 2. At most faint residual enhancement of the punctate lateral right  cerebellar lesion (series 11, image 46 and series 14, image 10). 3. The punctate residual left parietal lesion on the prior study is no longer visible. Other Brain findings: There is no evidence of an acute infarct, intracranial hemorrhage, midline shift, or extra-axial fluid collection. The ventricles and sulci are normal. T2 hyperintensities in the cerebral white matter bilaterally are unchanged and nonspecific but compatible with mild-to-moderate chronic small vessel ischemic disease. Vascular: Major  intracranial vascular flow voids are preserved. Skull and upper cervical spine: Unremarkable bone marrow signal. Sinuses/Orbits: Unremarkable orbits. Unchanged small right maxillary sinus mucous retention cyst. Clear mastoid air cells. Other: Unchanged 2.4 cm enhancing right parotid mass. IMPRESSION: 1. Stable to decreased size of treated brain metastases. 2. No evidence of new intracranial metastases. 3. Unchanged right parotid mass. Electronically Signed   By: Logan Bores M.D.   On: 10/26/2020 08:56   CT Abdomen Pelvis W Contrast  Result Date: 10/26/2020 CLINICAL DATA:  Primary Cancer Type: Lung Imaging Indication: Assess response to therapy Interval therapy since last imaging? Yes Initial Cancer Diagnosis Date: 03/30/2020; Established by: Biopsy-proven Detailed Pathology: Stage IV non-small cell lung cancer favoring adenocarcinoma. Primary Tumor location: Right upper lobe.  Brain metastases. Surgeries: No. Chemotherapy: Yes; Ongoing? Yes; Most recent administration: 10/09/2020 Immunotherapy?  Yes; Type: Keytruda; Ongoing? Yes Radiation therapy?  Yes; Date Range: 04/20/2020; Target: Brain EXAM: CT CHEST, ABDOMEN, AND PELVIS WITH CONTRAST TECHNIQUE: Multidetector CT imaging of the chest, abdomen and pelvis was performed following the standard protocol during bolus administration of intravenous contrast. CONTRAST:  7mL OMNIPAQUE IOHEXOL 300 MG/ML SOLN, additional oral enteric contrast COMPARISON:  Most recent CT chest, abdomen and pelvis 08/27/2020. 04/02/2020 PET-CT. FINDINGS: CT CHEST FINDINGS Cardiovascular: Right chest port catheter. Scattered aortic atherosclerosis. Normal heart size. No pericardial effusion. Mediastinum/Nodes: Redemonstrated enlargement of a high left paratracheal/superior mediastinal lymph node measuring 1.2 x 1.0 cm, previously FDG PET avid and enlarged when compared back to examination dating 06/25/2020 (series 2, image 16). No other enlarged mediastinal, hilar, or axillary lymph  nodes. Thyroid gland, trachea, and esophagus demonstrate no significant findings. Lungs/Pleura: Mild centrilobular emphysema. No significant change in a spiculated mass of the posterior right upper lobe measuring approximately 4.1 x 1.4 cm (series 4, image 71). Unchanged mixed solid and ground-glass nodule of the dependent right lung base, measure 2.7 x 2.3 cm (series 4, image 88). Unchanged mixed solid and ground-glass spiculated nodule of the azygoesophageal recess of the right lower lobe, measuring 1.9 x 1.2 cm (series 4, image 71). Unchanged mixed solid and cystic lesion of the dependent left lung base, measuring approximately 3.4 x 2.8 cm (series 4, image 112). Multiple additional smaller ground-glass nodules, predominantly of the right lung, are unchanged. No pleural effusion or pneumothorax. Musculoskeletal: No chest wall mass or suspicious bone lesions identified. Unchanged small subcutaneous inclusion cysts about the anterior chest wall, not previously FDG PET avid (e.g. Series 2, image 57). CT ABDOMEN PELVIS FINDINGS Hepatobiliary: No solid liver abnormality is seen. No gallstones, gallbladder wall thickening, or biliary dilatation. Pancreas: Unremarkable. No pancreatic ductal dilatation or surrounding inflammatory changes. Spleen: Normal in size without significant abnormality. Adrenals/Urinary Tract: Adrenal glands are unremarkable. Kidneys are normal, without renal calculi, solid lesion, or hydronephrosis. Bladder is unremarkable. Stomach/Bowel: Stomach is within normal limits. Appendix appears normal. No evidence of bowel wall thickening, distention, or inflammatory changes. Vascular/Lymphatic: Aortic atherosclerosis. No enlarged abdominal or pelvic lymph nodes. Reproductive: No mass or other abnormality. Other: No abdominal wall hernia or abnormality. No abdominopelvic ascites. Musculoskeletal: No  acute or significant osseous findings. IMPRESSION: 1. No significant change in post treatment appearance  of a spiculated mass of the posterior right upper lobe measuring approximately 4.1 x 1.4 cm. 2. Unchanged mixed solid and cystic lesion of the dependent left lung base, measuring approximately 3.4 x 2.8 cm. 3. Multiple additional mixed solid and ground-glass and ground-glass nodules as detailed above are unchanged. These remain highly suspicious for synchronous multifocal adenocarcinoma. Attention on follow-up. 4. Redemonstrated enlargement of a high left paratracheal/superior mediastinal lymph node measuring 1.2 x 1.0 cm, previously FDG PET avid and enlarged when compared back to examination dating 06/25/2020. This is highly suspicious for nodal recurrence. 5. No evidence of metastatic disease within the abdomen or pelvis. 6. Emphysema. Aortic Atherosclerosis (ICD10-I70.0) and Emphysema (ICD10-J43.9). Electronically Signed   By: Eddie Candle M.D.   On: 10/26/2020 13:50    ASSESSMENT AND PLAN: This is a very pleasant 65 years old African-American male recently diagnosed with a stage IV (T3, N2, M1c)  non-small cell lung cancer, adenocarcinoma presented with right upper lobe lung mass in addition to right lower lobe, left lower lobe in addition to right hilar and mediastinal lymphadenopathy in addition to metastatic brain lesions diagnosed in October 2021. The patient has molecular studies by [employer] that showed no actionable mutations. He underwent SRS treatment to his brain lesion. The patient is currently undergoing systemic chemotherapy with carboplatin for AUC of 5, Alimta 500 mg/M2 and Keytruda 200 mg IV every 3 weeks status post 10 cycles.  Starting from cycle #5 the patient will be on maintenance treatment with Alimta and Keytruda every 3 weeks. The patient continues to tolerate his treatment fairly well. His liver enzymes are elevated today but the patient mentioned that he was using some herbal stuff from his nephew who has Crohn's disease and convince the patient that the herbs with help with  his cancer. I strongly recommend for him to avoid any herbal or over-the-counter medications without consulting with [name]. He will proceed with cycle #11 today. I will see him back for follow-up visit in 3 weeks for evaluation before the next cycle of his treatment. He was advised to call immediately if he has any concerning symptoms in the interval.  The patient voices understanding of current disease status and treatment options and is in agreement with the current care plan.  All questions were answered. The patient knows to call the clinic with any problems, questions or concerns. We can certainly see the patient much sooner if necessary.  Disclaimer: This note was dictated with voice recognition software. Similar sounding words can inadvertently be transcribed and may not be corrected upon review.

## 2020-12-11 ENCOUNTER — Encounter: Payer: Self-pay | Admitting: Internal Medicine

## 2020-12-11 ENCOUNTER — Inpatient Hospital Stay: Payer: 59

## 2020-12-11 ENCOUNTER — Other Ambulatory Visit: Payer: Self-pay

## 2020-12-11 ENCOUNTER — Inpatient Hospital Stay (HOSPITAL_BASED_OUTPATIENT_CLINIC_OR_DEPARTMENT_OTHER): Payer: 59 | Admitting: Internal Medicine

## 2020-12-11 VITALS — BP 106/58 | HR 75 | Temp 97.4°F | Resp 18 | Ht 72.0 in | Wt 166.3 lb

## 2020-12-11 DIAGNOSIS — C3491 Malignant neoplasm of unspecified part of right bronchus or lung: Secondary | ICD-10-CM

## 2020-12-11 DIAGNOSIS — R7302 Impaired glucose tolerance (oral): Secondary | ICD-10-CM

## 2020-12-11 DIAGNOSIS — Z5112 Encounter for antineoplastic immunotherapy: Secondary | ICD-10-CM

## 2020-12-11 DIAGNOSIS — Z95828 Presence of other vascular implants and grafts: Secondary | ICD-10-CM

## 2020-12-11 DIAGNOSIS — I10 Essential (primary) hypertension: Secondary | ICD-10-CM | POA: Diagnosis not present

## 2020-12-11 DIAGNOSIS — C3490 Malignant neoplasm of unspecified part of unspecified bronchus or lung: Secondary | ICD-10-CM

## 2020-12-11 DIAGNOSIS — E559 Vitamin D deficiency, unspecified: Secondary | ICD-10-CM

## 2020-12-11 DIAGNOSIS — Z5111 Encounter for antineoplastic chemotherapy: Secondary | ICD-10-CM

## 2020-12-11 DIAGNOSIS — E7800 Pure hypercholesterolemia, unspecified: Secondary | ICD-10-CM

## 2020-12-11 LAB — CMP (CANCER CENTER ONLY)
ALT: 51 U/L — ABNORMAL HIGH (ref 0–44)
AST: 39 U/L (ref 15–41)
Albumin: 3.3 g/dL — ABNORMAL LOW (ref 3.5–5.0)
Alkaline Phosphatase: 49 U/L (ref 38–126)
Anion gap: 11 (ref 5–15)
BUN: 9 mg/dL (ref 8–23)
CO2: 23 mmol/L (ref 22–32)
Calcium: 8.8 mg/dL — ABNORMAL LOW (ref 8.9–10.3)
Chloride: 103 mmol/L (ref 98–111)
Creatinine: 1.1 mg/dL (ref 0.61–1.24)
GFR, Estimated: >60 mL/min (ref >60)
Glucose, Bld: 158 mg/dL — ABNORMAL HIGH (ref 70–99)
Potassium: 4 mmol/L (ref 3.5–5.1)
Sodium: 137 mmol/L (ref 135–145)
Total Bilirubin: 0.4 mg/dL (ref 0.3–1.2)
Total Protein: 7.1 g/dL (ref 6.5–8.1)

## 2020-12-11 LAB — CBC WITH DIFFERENTIAL (CANCER CENTER ONLY)
Abs Immature Granulocytes: 0.02 10*3/uL (ref 0.00–0.07)
Basophils Absolute: 0 10*3/uL (ref 0.0–0.1)
Basophils Relative: 0 %
Eosinophils Absolute: 0.1 10*3/uL (ref 0.0–0.5)
Eosinophils Relative: 2 %
HCT: 31.5 % — ABNORMAL LOW (ref 39.0–52.0)
Hemoglobin: 10.7 g/dL — ABNORMAL LOW (ref 13.0–17.0)
Immature Granulocytes: 0 %
Lymphocytes Relative: 32 %
Lymphs Abs: 1.8 10*3/uL (ref 0.7–4.0)
MCH: 28.7 pg (ref 26.0–34.0)
MCHC: 34 g/dL (ref 30.0–36.0)
MCV: 84.5 fL (ref 80.0–100.0)
Monocytes Absolute: 0.6 10*3/uL (ref 0.1–1.0)
Monocytes Relative: 11 %
Neutro Abs: 3.1 10*3/uL (ref 1.7–7.7)
Neutrophils Relative %: 55 %
Platelet Count: 184 10*3/uL (ref 150–400)
RBC: 3.73 MIL/uL — ABNORMAL LOW (ref 4.22–5.81)
RDW: 16.2 % — ABNORMAL HIGH (ref 11.5–15.5)
WBC Count: 5.7 10*3/uL (ref 4.0–10.5)
nRBC: 0 % (ref 0.0–0.2)

## 2020-12-11 LAB — TSH: TSH: 2.399 u[IU]/mL (ref 0.320–4.118)

## 2020-12-11 MED ORDER — SODIUM CHLORIDE 0.9% FLUSH
10.0000 mL | Freq: Once | INTRAVENOUS | Status: AC
  Administered 2020-12-11: 10 mL
  Filled 2020-12-11: qty 10

## 2020-12-11 MED ORDER — SODIUM CHLORIDE 0.9 % IV SOLN
500.0000 mg/m2 | Freq: Once | INTRAVENOUS | Status: AC
  Administered 2020-12-11: 900 mg via INTRAVENOUS
  Filled 2020-12-11: qty 20

## 2020-12-11 MED ORDER — HEPARIN SOD (PORK) LOCK FLUSH 100 UNIT/ML IV SOLN
500.0000 [IU] | Freq: Once | INTRAVENOUS | Status: AC | PRN
  Administered 2020-12-11: 500 [IU]
  Filled 2020-12-11: qty 5

## 2020-12-11 MED ORDER — SODIUM CHLORIDE 0.9 % IV SOLN
200.0000 mg | Freq: Once | INTRAVENOUS | Status: AC
  Administered 2020-12-11: 200 mg via INTRAVENOUS
  Filled 2020-12-11: qty 8

## 2020-12-11 MED ORDER — SODIUM CHLORIDE 0.9 % IV SOLN
Freq: Once | INTRAVENOUS | Status: AC
  Filled 2020-12-11: qty 250

## 2020-12-11 MED ORDER — SODIUM CHLORIDE 0.9% FLUSH
10.0000 mL | INTRAVENOUS | Status: DC | PRN
  Administered 2020-12-11: 10 mL
  Filled 2020-12-11: qty 10

## 2020-12-11 MED ORDER — CYANOCOBALAMIN 1000 MCG/ML IJ SOLN
1000.0000 ug | Freq: Once | INTRAMUSCULAR | Status: AC
  Administered 2020-12-11: 1000 ug via INTRAMUSCULAR

## 2020-12-11 MED ORDER — CYANOCOBALAMIN 1000 MCG/ML IJ SOLN
INTRAMUSCULAR | Status: AC
  Filled 2020-12-11: qty 1

## 2020-12-11 MED ORDER — PROCHLORPERAZINE MALEATE 10 MG PO TABS
10.0000 mg | ORAL_TABLET | Freq: Once | ORAL | Status: AC
Start: 2020-12-11 — End: 2020-12-11
  Administered 2020-12-11: 10 mg via ORAL

## 2020-12-11 MED ORDER — PROCHLORPERAZINE MALEATE 10 MG PO TABS
ORAL_TABLET | ORAL | Status: AC
  Filled 2020-12-11: qty 1

## 2020-12-11 NOTE — Progress Notes (Signed)
[facility] [phone]   [phone]  OFFICE PROGRESS NOTE  [name], MD [place] [address]  DIAGNOSIS: Stage IV (T3, N2, M1c) non-small cell lung cancer favoring adenocarcinoma presented with right upper lobe lung mass in addition to right lower lobe, left lower lobe in addition to right hilar and mediastinal lymphadenopathy in addition to metastatic brain lesions diagnosed in October 2021.  Molecular studies by [employer]:  [id number], 9.5%,   PRIOR THERAPY: SRS to 3 brain lesion under the care of Dr. Lisbeth Renshaw.  Scheduled for April 20, 2020  CURRENT THERAPY: Systemic chemotherapy with carboplatin for AUC of 5, Alimta 500 mg/M2 and Keytruda 200 mg IV every 3 weeks.  First dose April 24, 2020.  Status post 11 cycles.  Starting from cycle #5 the patient will be on maintenance treatment with Alimta and Keytruda every 3 weeks.  INTERVAL HISTORY: Nicolas Allen 65 y.o. male returns to the clinic today for follow-up visit.  The patient is feeling fine today with no concerning complaints except for increasing frequency of his dry cough.  He is using cough drops with improvement.  The patient denied having any chest pain, shortness of breath or hemoptysis.  He denied having any fever or chills.  He has no nausea, vomiting, diarrhea or constipation.  He denied having any headache or visual changes.  He is here today for evaluation before starting cycle #12 of his treatment.  MEDICAL HISTORY: Past Medical History:  Diagnosis Date   Anemia 06/24/2012   ANXIETY 02/03/2007   Cancer ([place])    Cervical disc disease 02/12/2012   S/p surgury jan 2013   Erectile dysfunction 02/11/2011   GERD (gastroesophageal reflux disease)    GLUCOSE INTOLERANCE 01/04/2008   HYPERLIPIDEMIA 02/03/2007   HYPERTENSION 02/03/2007   IBS (irritable bowel syndrome)    Impaired glucose tolerance 02/11/2011   Smoker 08/22/2014   Substance abuse ([place]) 2001    sober for 16 yrs    ALLERGIES:  has No Known Allergies.  MEDICATIONS:  Current Outpatient Medications  Medication Sig Dispense Refill   amLODipine-benazepril (LOTREL) 10-20 MG capsule TAKE 1 CAPSULE BY MOUTH ONCE DAILY 90 capsule 3   aspirin EC 81 MG tablet Take 81 mg by mouth daily. Swallow whole.     b complex vitamins tablet Take 1 tablet by mouth daily.     cholecalciferol (VITAMIN D3) 25 MCG (1000 UNIT) tablet Take 1,000 Units by mouth daily.     folic acid (FOLVITE) 1 MG tablet Take 1 tablet (1 mg total) by mouth daily. 30 tablet 4   Garlic 7341 MG CAPS Take 1,000 mg by mouth daily.      ibuprofen (ADVIL) 200 MG tablet Take 200 mg by mouth every 6 (six) hours as needed.     lidocaine-prilocaine (EMLA) cream Apply to the Port-A-Cath site 30-60 minutes before chemotherapy. 30 g 0   Omega-3 Fatty Acids (FISH OIL PO) Take 1 capsule by mouth daily.      polyethylene glycol (MIRALAX / GLYCOLAX) 17 g packet Take 17 g by mouth daily as needed.     pravastatin (PRAVACHOL) 40 MG tablet TAKE 1 TABLET BY MOUTH ONCE DAILY 90 tablet 3   prochlorperazine (COMPAZINE) 10 MG tablet Take 1 tablet (10 mg total) by mouth every 6 (six) hours as needed for nausea or vomiting. 30 tablet 0   vardenafil (LEVITRA) 20 MG tablet TAKE 1 TABLET BY MOUTH AS NEEDED  FOR  ERECTILE  DYSFUNCTION 10 tablet 5   No current facility-administered medications for this visit.    SURGICAL HISTORY:  Past Surgical History:  Procedure Laterality Date   BRONCHIAL BIOPSY  03/30/2020   Procedure: BRONCHIAL BIOPSIES;  Surgeon: Garner Nash, DO;  Location: [facility];  Service: Pulmonary;;   BRONCHIAL BRUSHINGS  03/30/2020   Procedure: BRONCHIAL BRUSHINGS;  Surgeon: Garner Nash, DO;  Location: [facility];  Service: Pulmonary;;   BRONCHIAL NEEDLE ASPIRATION BIOPSY  03/30/2020   Procedure: BRONCHIAL NEEDLE ASPIRATION BIOPSIES;  Surgeon: Garner Nash, DO;  Location: [facility];  Service: Pulmonary;;   BRONCHIAL  WASHINGS  03/30/2020   Procedure: BRONCHIAL WASHINGS;  Surgeon: Garner Nash, DO;  Location: [facility];  Service: Pulmonary;;   COLONOSCOPY  2007   IR IMAGING GUIDED PORT INSERTION  04/23/2020   neck fusion  2012   C4   NO PAST SURGERIES     VIDEO BRONCHOSCOPY WITH ENDOBRONCHIAL NAVIGATION N/A 03/30/2020   Procedure: VIDEO BRONCHOSCOPY WITH ENDOBRONCHIAL NAVIGATION;  Surgeon: Garner Nash, DO;  Location: [facility];  Service: Pulmonary;  Laterality: N/A;   VIDEO BRONCHOSCOPY WITH ENDOBRONCHIAL ULTRASOUND N/A 03/30/2020   Procedure: VIDEO BRONCHOSCOPY WITH ENDOBRONCHIAL ULTRASOUND;  Surgeon: Garner Nash, DO;  Location: [facility];  Service: Pulmonary;  Laterality: N/A;    REVIEW OF SYSTEMS:  A comprehensive review of systems was negative except for: Respiratory: positive for cough   PHYSICAL EXAMINATION: General appearance: alert, cooperative, and no distress Head: Normocephalic, without obvious abnormality, atraumatic Neck: no adenopathy, no JVD, supple, symmetrical, trachea midline, and thyroid not enlarged, symmetric, no tenderness/mass/nodules Lymph nodes: Cervical, supraclavicular, and axillary nodes normal. Resp: clear to auscultation bilaterally Back: symmetric, no curvature. ROM normal. No CVA tenderness. Cardio: regular rate and rhythm, S1, S2 normal, no murmur, click, rub or gallop GI: soft, non-tender; bowel sounds normal; no masses,  no organomegaly Extremities: extremities normal, atraumatic, no cyanosis or edema  ECOG PERFORMANCE STATUS: 1 - Symptomatic but completely ambulatory  Blood pressure (!) 106/58, pulse 75, temperature (!) 97.4 F (36.3 C), temperature source Tympanic, resp. rate 18, height 6' (1.829 m), weight 166 lb 4.8 oz (75.4 kg), SpO2 100 %.  LABORATORY DATA: Lab Results  Component Value Date   WBC 5.7 12/11/2020   HGB 10.7 (L) 12/11/2020   HCT 31.5 (L) 12/11/2020   MCV 84.5 12/11/2020   PLT 184 12/11/2020      Chemistry       Component Value Date/Time   NA 138 11/20/2020 0834   K 4.1 11/20/2020 0834   CL 103 11/20/2020 0834   CO2 23 11/20/2020 0834   BUN 13 11/20/2020 0834   CREATININE 1.07 11/20/2020 0834   CREATININE 0.88 02/21/2020 1115      Component Value Date/Time   CALCIUM 9.2 11/20/2020 0834   ALKPHOS 50 11/20/2020 0834   AST 77 (H) 11/20/2020 0834   ALT 120 (H) 11/20/2020 0834   BILITOT 0.3 11/20/2020 0834       RADIOGRAPHIC STUDIES: No results found.   ASSESSMENT AND PLAN: This is a very pleasant 65 years old African-American male recently diagnosed with a stage IV (T3, N2, M1c)  non-small cell lung cancer, adenocarcinoma presented with right upper lobe lung mass in addition to right lower lobe, left lower lobe in addition to right hilar and mediastinal lymphadenopathy in addition to metastatic brain lesions diagnosed in October 2021. The patient has molecular studies by [employer] that showed no actionable mutations. He  underwent SRS treatment to his brain lesion. The patient is currently undergoing systemic chemotherapy with carboplatin for AUC of 5, Alimta 500 mg/M2 and Keytruda 200 mg IV every 3 weeks status post 11 cycles.  Starting from cycle #5 the patient will be on maintenance treatment with Alimta and Keytruda every 3 weeks. The patient continues to tolerate his treatment with maintenance Alimta and Keytruda fairly well. I recommended for him to proceed with cycle #12 today as planned. I will see him back for follow-up visit in 3 weeks for evaluation with repeat CT scan of the chest, abdomen pelvis for restaging of his disease. The patient was advised to call immediately if he has any concerning symptoms in the interval. The patient voices understanding of current disease status and treatment options and is in agreement with the current care plan.  All questions were answered. The patient knows to call the clinic with any problems, questions or concerns. We can certainly see the  patient much sooner if necessary.  Disclaimer: This note was dictated with voice recognition software. Similar sounding words can inadvertently be transcribed and may not be corrected upon review.

## 2020-12-11 NOTE — Patient Instructions (Signed)
[facility]  Discharge Instructions: Thank you for choosing [facility] to provide your oncology and hematology care.   If you have a lab appointment with the Cancer Center, please go directly to the Cancer Center and check in at the registration area.   Wear comfortable clothing and clothing appropriate for easy access to any Portacath or PICC line.   We strive to give you quality time with your provider. You may need to reschedule your appointment if you arrive late (15 or more minutes).  Arriving late affects you and other patients whose appointments are after yours.  Also, if you miss three or more appointments without notifying the office, you may be dismissed from the clinic at the provider's discretion.      For prescription refill requests, have your pharmacy contact our office and allow 72 hours for refills to be completed.    Today you received the following chemotherapy and/or immunotherapy agents: Keytruda/Alimta.      To help prevent nausea and vomiting after your treatment, we encourage you to take your nausea medication as directed.  BELOW ARE SYMPTOMS THAT SHOULD BE REPORTED IMMEDIATELY: *FEVER GREATER THAN 100.4 F (38 C) OR HIGHER *CHILLS OR SWEATING *NAUSEA AND VOMITING THAT IS NOT CONTROLLED WITH YOUR NAUSEA MEDICATION *UNUSUAL SHORTNESS OF BREATH *UNUSUAL BRUISING OR BLEEDING *URINARY PROBLEMS (pain or burning when urinating, or frequent urination) *BOWEL PROBLEMS (unusual diarrhea, constipation, pain near the anus) TENDERNESS IN MOUTH AND THROAT WITH OR WITHOUT PRESENCE OF ULCERS (sore throat, sores in mouth, or a toothache) UNUSUAL RASH, SWELLING OR PAIN  UNUSUAL VAGINAL DISCHARGE OR ITCHING   Items with * indicate a potential emergency and should be followed up as soon as possible or go to the Emergency Department if any problems should occur.  Please show the CHEMOTHERAPY ALERT CARD or IMMUNOTHERAPY ALERT CARD at  check-in to the Emergency Department and triage nurse.  Should you have questions after your visit or need to cancel or reschedule your appointment, please contact [facility]  Dept: [phone]  and follow the prompts.  Office hours are 8:00 a.m. to 4:30 p.m. Monday - Friday. Please note that voicemails left after 4:00 p.m. may not be returned until the following business day.  We are closed weekends and major holidays. You have access to a nurse at all times for urgent questions. Please call the main number to the clinic Dept: [phone] and follow the prompts.   For any non-urgent questions, you may also contact your provider using MyChart. We now offer e-Visits for anyone 18 and older to request care online for non-urgent symptoms. For details visit [email or web address].   Also download the MyChart app! Go to the app store, search "MyChart", open the app, select [facility], and log in with your MyChart username and password.  Due to Covid, a mask is required upon entering the hospital/clinic. If you do not have a mask, one will be given to you upon arrival. For doctor visits, patients may have 1 support person aged 18 or older with them. For treatment visits, patients cannot have anyone with them due to current Covid guidelines and our immunocompromised population.   

## 2020-12-28 ENCOUNTER — Other Ambulatory Visit: Payer: Self-pay

## 2020-12-28 ENCOUNTER — Ambulatory Visit (HOSPITAL_COMMUNITY)
Admission: RE | Admit: 2020-12-28 | Discharge: 2020-12-28 | Disposition: A | Discharge status: Home / Self Care | Payer: 59 | Source: Ambulatory Visit | Attending: Internal Medicine | Admitting: Internal Medicine

## 2020-12-28 DIAGNOSIS — C3490 Malignant neoplasm of unspecified part of unspecified bronchus or lung: Secondary | ICD-10-CM | POA: Diagnosis present

## 2020-12-28 MED ORDER — SODIUM CHLORIDE (PF) 0.9 % IJ SOLN
INTRAMUSCULAR | Status: AC
  Filled 2020-12-28: qty 50

## 2020-12-28 MED ORDER — IOHEXOL 350 MG/ML SOLN
85.0000 mL | Freq: Once | INTRAVENOUS | Status: AC | PRN
  Administered 2020-12-28: 85 mL via INTRAVENOUS

## 2020-12-28 MED ORDER — HEPARIN SOD (PORK) LOCK FLUSH 100 UNIT/ML IV SOLN
500.0000 [IU] | Freq: Once | INTRAVENOUS | Status: AC
  Administered 2020-12-28: 500 [IU]

## 2020-12-28 MED ORDER — HEPARIN SOD (PORK) LOCK FLUSH 100 UNIT/ML IV SOLN
INTRAVENOUS | Status: AC
  Filled 2020-12-28: qty 5

## 2021-01-01 ENCOUNTER — Inpatient Hospital Stay: Payer: 59

## 2021-01-01 ENCOUNTER — Other Ambulatory Visit: Payer: Self-pay

## 2021-01-01 ENCOUNTER — Inpatient Hospital Stay: Payer: 59 | Attending: Internal Medicine | Admitting: Internal Medicine

## 2021-01-01 VITALS — BP 104/58 | HR 73 | Temp 96.4°F | Resp 19 | Ht 72.0 in | Wt 165.1 lb

## 2021-01-01 DIAGNOSIS — Z5111 Encounter for antineoplastic chemotherapy: Secondary | ICD-10-CM | POA: Insufficient documentation

## 2021-01-01 DIAGNOSIS — Z87891 Personal history of nicotine dependence: Secondary | ICD-10-CM | POA: Insufficient documentation

## 2021-01-01 DIAGNOSIS — C3491 Malignant neoplasm of unspecified part of right bronchus or lung: Secondary | ICD-10-CM

## 2021-01-01 DIAGNOSIS — C3431 Malignant neoplasm of lower lobe, right bronchus or lung: Secondary | ICD-10-CM | POA: Insufficient documentation

## 2021-01-01 DIAGNOSIS — I10 Essential (primary) hypertension: Secondary | ICD-10-CM | POA: Diagnosis not present

## 2021-01-01 DIAGNOSIS — Z5112 Encounter for antineoplastic immunotherapy: Secondary | ICD-10-CM | POA: Diagnosis present

## 2021-01-01 DIAGNOSIS — C7931 Secondary malignant neoplasm of brain: Secondary | ICD-10-CM | POA: Diagnosis not present

## 2021-01-01 DIAGNOSIS — C3432 Malignant neoplasm of lower lobe, left bronchus or lung: Secondary | ICD-10-CM | POA: Insufficient documentation

## 2021-01-01 DIAGNOSIS — C3411 Malignant neoplasm of upper lobe, right bronchus or lung: Secondary | ICD-10-CM | POA: Insufficient documentation

## 2021-01-01 DIAGNOSIS — Z95828 Presence of other vascular implants and grafts: Secondary | ICD-10-CM

## 2021-01-01 DIAGNOSIS — Z79899 Other long term (current) drug therapy: Secondary | ICD-10-CM | POA: Insufficient documentation

## 2021-01-01 LAB — CBC WITH DIFFERENTIAL (CANCER CENTER ONLY)
Abs Immature Granulocytes: 0.01 10*3/uL (ref 0.00–0.07)
Basophils Absolute: 0 10*3/uL (ref 0.0–0.1)
Basophils Relative: 0 %
Eosinophils Absolute: 0.2 10*3/uL (ref 0.0–0.5)
Eosinophils Relative: 3 %
HCT: 31.6 % — ABNORMAL LOW (ref 39.0–52.0)
Hemoglobin: 10.7 g/dL — ABNORMAL LOW (ref 13.0–17.0)
Immature Granulocytes: 0 %
Lymphocytes Relative: 38 %
Lymphs Abs: 1.7 10*3/uL (ref 0.7–4.0)
MCH: 28.7 pg (ref 26.0–34.0)
MCHC: 33.9 g/dL (ref 30.0–36.0)
MCV: 84.7 fL (ref 80.0–100.0)
Monocytes Absolute: 0.6 10*3/uL (ref 0.1–1.0)
Monocytes Relative: 14 %
Neutro Abs: 2 10*3/uL (ref 1.7–7.7)
Neutrophils Relative %: 45 %
Platelet Count: 169 10*3/uL (ref 150–400)
RBC: 3.73 MIL/uL — ABNORMAL LOW (ref 4.22–5.81)
RDW: 16.4 % — ABNORMAL HIGH (ref 11.5–15.5)
WBC Count: 4.5 10*3/uL (ref 4.0–10.5)
nRBC: 0 % (ref 0.0–0.2)

## 2021-01-01 LAB — CMP (CANCER CENTER ONLY)
ALT: 40 U/L (ref 0–44)
AST: 38 U/L (ref 15–41)
Albumin: 3.3 g/dL — ABNORMAL LOW (ref 3.5–5.0)
Alkaline Phosphatase: 53 U/L (ref 38–126)
Anion gap: 7 (ref 5–15)
BUN: 14 mg/dL (ref 8–23)
CO2: 28 mmol/L (ref 22–32)
Calcium: 9.1 mg/dL (ref 8.9–10.3)
Chloride: 105 mmol/L (ref 98–111)
Creatinine: 1.02 mg/dL (ref 0.61–1.24)
GFR, Estimated: >60 mL/min (ref >60)
Glucose, Bld: 128 mg/dL — ABNORMAL HIGH (ref 70–99)
Potassium: 4.2 mmol/L (ref 3.5–5.1)
Sodium: 140 mmol/L (ref 135–145)
Total Bilirubin: 0.3 mg/dL (ref 0.3–1.2)
Total Protein: 7.1 g/dL (ref 6.5–8.1)

## 2021-01-01 LAB — TSH: TSH: 1.537 u[IU]/mL (ref 0.320–4.118)

## 2021-01-01 MED ORDER — CYANOCOBALAMIN 1000 MCG/ML IJ SOLN
INTRAMUSCULAR | Status: AC
  Filled 2021-01-01: qty 1

## 2021-01-01 MED ORDER — SODIUM CHLORIDE 0.9 % IV SOLN
Freq: Once | INTRAVENOUS | Status: AC
  Filled 2021-01-01: qty 250

## 2021-01-01 MED ORDER — SODIUM CHLORIDE 0.9 % IV SOLN
200.0000 mg | Freq: Once | INTRAVENOUS | Status: AC
  Administered 2021-01-01: 200 mg via INTRAVENOUS
  Filled 2021-01-01: qty 8

## 2021-01-01 MED ORDER — SODIUM CHLORIDE 0.9% FLUSH
10.0000 mL | Freq: Once | INTRAVENOUS | Status: AC
  Administered 2021-01-01: 10 mL
  Filled 2021-01-01: qty 10

## 2021-01-01 MED ORDER — HEPARIN SOD (PORK) LOCK FLUSH 100 UNIT/ML IV SOLN
500.0000 [IU] | Freq: Once | INTRAVENOUS | Status: AC | PRN
  Administered 2021-01-01: 500 [IU]
  Filled 2021-01-01: qty 5

## 2021-01-01 MED ORDER — SODIUM CHLORIDE 0.9 % IV SOLN
500.0000 mg/m2 | Freq: Once | INTRAVENOUS | Status: AC
  Administered 2021-01-01: 900 mg via INTRAVENOUS
  Filled 2021-01-01: qty 20

## 2021-01-01 MED ORDER — PROCHLORPERAZINE MALEATE 10 MG PO TABS
10.0000 mg | ORAL_TABLET | Freq: Once | ORAL | Status: AC
  Administered 2021-01-01: 10 mg via ORAL

## 2021-01-01 MED ORDER — SODIUM CHLORIDE 0.9% FLUSH
10.0000 mL | INTRAVENOUS | Status: DC | PRN
  Administered 2021-01-01: 10 mL
  Filled 2021-01-01: qty 10

## 2021-01-01 MED ORDER — PROCHLORPERAZINE MALEATE 10 MG PO TABS
ORAL_TABLET | ORAL | Status: AC
  Filled 2021-01-01: qty 1

## 2021-01-01 NOTE — Patient Instructions (Signed)
[facility]  Discharge Instructions: Thank you for choosing [facility] to provide your oncology and hematology care.   If you have a lab appointment with the Cancer Center, please go directly to the Cancer Center and check in at the registration area.   Wear comfortable clothing and clothing appropriate for easy access to any Portacath or PICC line.   We strive to give you quality time with your provider. You may need to reschedule your appointment if you arrive late (15 or more minutes).  Arriving late affects you and other patients whose appointments are after yours.  Also, if you miss three or more appointments without notifying the office, you may be dismissed from the clinic at the provider's discretion.      For prescription refill requests, have your pharmacy contact our office and allow 72 hours for refills to be completed.    Today you received the following chemotherapy and/or immunotherapy agents Pembrolizumab (Keytruda) and Pemetrexed (Alimta)      To help prevent nausea and vomiting after your treatment, we encourage you to take your nausea medication as directed.  BELOW ARE SYMPTOMS THAT SHOULD BE REPORTED IMMEDIATELY: *FEVER GREATER THAN 100.4 F (38 C) OR HIGHER *CHILLS OR SWEATING *NAUSEA AND VOMITING THAT IS NOT CONTROLLED WITH YOUR NAUSEA MEDICATION *UNUSUAL SHORTNESS OF BREATH *UNUSUAL BRUISING OR BLEEDING *URINARY PROBLEMS (pain or burning when urinating, or frequent urination) *BOWEL PROBLEMS (unusual diarrhea, constipation, pain near the anus) TENDERNESS IN MOUTH AND THROAT WITH OR WITHOUT PRESENCE OF ULCERS (sore throat, sores in mouth, or a toothache) UNUSUAL RASH, SWELLING OR PAIN  UNUSUAL VAGINAL DISCHARGE OR ITCHING   Items with * indicate a potential emergency and should be followed up as soon as possible or go to the Emergency Department if any problems should occur.  Please show the CHEMOTHERAPY ALERT CARD or  IMMUNOTHERAPY ALERT CARD at check-in to the Emergency Department and triage nurse.  Should you have questions after your visit or need to cancel or reschedule your appointment, please contact [facility]  Dept: [phone]  and follow the prompts.  Office hours are 8:00 a.m. to 4:30 p.m. Monday - Friday. Please note that voicemails left after 4:00 p.m. may not be returned until the following business day.  We are closed weekends and major holidays. You have access to a nurse at all times for urgent questions. Please call the main number to the clinic Dept: [phone] and follow the prompts.   For any non-urgent questions, you may also contact your provider using MyChart. We now offer e-Visits for anyone 18 and older to request care online for non-urgent symptoms. For details visit [email or web address].   Also download the MyChart app! Go to the app store, search "MyChart", open the app, select [facility], and log in with your MyChart username and password.  Due to Covid, a mask is required upon entering the hospital/clinic. If you do not have a mask, one will be given to you upon arrival. For doctor visits, patients may have 1 support person aged 18 or older with them. For treatment visits, patients cannot have anyone with them due to current Covid guidelines and our immunocompromised population.   

## 2021-01-01 NOTE — Progress Notes (Signed)
[place] [phone]   [phone]  OFFICE PROGRESS NOTE  [name], MD [place] [id number]  DIAGNOSIS: Stage IV (T3, N2, M1c) non-small cell lung cancer favoring adenocarcinoma presented with right upper lobe lung mass in addition to right lower lobe, left lower lobe in addition to right hilar and mediastinal lymphadenopathy in addition to metastatic brain lesions diagnosed in October 2021.  Molecular studies by [employer]:  [id number], 9.5%,   PRIOR THERAPY: SRS to 3 brain lesion under the care of Dr. Lisbeth Renshaw.  Scheduled for April 20, 2020  CURRENT THERAPY: Systemic chemotherapy with carboplatin for AUC of 5, Alimta 500 mg/M2 and Keytruda 200 mg IV every 3 weeks.  First dose April 24, 2020.  Status post 12 cycles.  Starting from cycle #5 the patient will be on maintenance treatment with Alimta and Keytruda every 3 weeks.  INTERVAL HISTORY: Nicolas Allen 65 y.o. male returns to the clinic today for follow-up visit accompanied by his wife.  The patient is feeling fine today with no concerning complaints.  He denied having any current chest pain, shortness of breath, cough or hemoptysis.  He denied having any nausea, vomiting, diarrhea or constipation.  He has no headache or visual changes.  He has no weight loss or night sweats.  He continues to tolerate his maintenance treatment with Alimta and Keytruda fairly well.  The patient had repeat CT scan of the chest, abdomen pelvis performed recently and he is here for evaluation and discussion of his scan results.  MEDICAL HISTORY: Past Medical History:  Diagnosis Date   Anemia 06/24/2012   ANXIETY 02/03/2007   Cancer ([place])    Cervical disc disease 02/12/2012   S/p surgury jan 2013   Erectile dysfunction 02/11/2011   GERD (gastroesophageal reflux disease)    GLUCOSE INTOLERANCE 01/04/2008   HYPERLIPIDEMIA 02/03/2007   HYPERTENSION 02/03/2007   IBS (irritable bowel  syndrome)    Impaired glucose tolerance 02/11/2011   Smoker 08/22/2014   Substance abuse ([place]) 2001   sober for 16 yrs    ALLERGIES:  has No Known Allergies.  MEDICATIONS:  Current Outpatient Medications  Medication Sig Dispense Refill   amLODipine-benazepril (LOTREL) 10-20 MG capsule TAKE 1 CAPSULE BY MOUTH ONCE DAILY 90 capsule 3   aspirin EC 81 MG tablet Take 81 mg by mouth daily. Swallow whole.     b complex vitamins tablet Take 1 tablet by mouth daily.     cholecalciferol (VITAMIN D3) 25 MCG (1000 UNIT) tablet Take 1,000 Units by mouth daily.     folic acid (FOLVITE) 1 MG tablet Take 1 tablet (1 mg total) by mouth daily. 30 tablet 4   Garlic 8119 MG CAPS Take 1,000 mg by mouth daily.      ibuprofen (ADVIL) 200 MG tablet Take 200 mg by mouth every 6 (six) hours as needed.     lidocaine-prilocaine (EMLA) cream Apply to the Port-A-Cath site 30-60 minutes before chemotherapy. 30 g 0   Omega-3 Fatty Acids (FISH OIL PO) Take 1 capsule by mouth daily.      polyethylene glycol (MIRALAX / GLYCOLAX) 17 g packet Take 17 g by mouth daily as needed.     pravastatin (PRAVACHOL) 40 MG tablet TAKE 1 TABLET BY MOUTH ONCE DAILY 90 tablet 3   prochlorperazine (COMPAZINE) 10 MG tablet Take 1 tablet (10 mg total) by mouth every 6 (six) hours as needed for nausea or vomiting. [place]  tablet 0   vardenafil (LEVITRA) 20 MG tablet TAKE 1 TABLET BY MOUTH AS NEEDED FOR  ERECTILE  DYSFUNCTION 10 tablet 5   No current facility-administered medications for this visit.    SURGICAL HISTORY:  Past Surgical History:  Procedure Laterality Date   BRONCHIAL BIOPSY  03/30/2020   Procedure: BRONCHIAL BIOPSIES;  Surgeon: Garner Nash, DO;  Location: [facility];  Service: Pulmonary;;   BRONCHIAL BRUSHINGS  03/30/2020   Procedure: BRONCHIAL BRUSHINGS;  Surgeon: Garner Nash, DO;  Location: [facility];  Service: Pulmonary;;   BRONCHIAL NEEDLE ASPIRATION BIOPSY  03/30/2020   Procedure: BRONCHIAL NEEDLE ASPIRATION  BIOPSIES;  Surgeon: Garner Nash, DO;  Location: [facility];  Service: Pulmonary;;   BRONCHIAL WASHINGS  03/30/2020   Procedure: BRONCHIAL WASHINGS;  Surgeon: Garner Nash, DO;  Location: [facility];  Service: Pulmonary;;   COLONOSCOPY  2007   IR IMAGING GUIDED PORT INSERTION  04/23/2020   neck fusion  2012   C4   NO PAST SURGERIES     VIDEO BRONCHOSCOPY WITH ENDOBRONCHIAL NAVIGATION N/A 03/30/2020   Procedure: VIDEO BRONCHOSCOPY WITH ENDOBRONCHIAL NAVIGATION;  Surgeon: Garner Nash, DO;  Location: [facility];  Service: Pulmonary;  Laterality: N/A;   VIDEO BRONCHOSCOPY WITH ENDOBRONCHIAL ULTRASOUND N/A 03/30/2020   Procedure: VIDEO BRONCHOSCOPY WITH ENDOBRONCHIAL ULTRASOUND;  Surgeon: Garner Nash, DO;  Location: [facility];  Service: Pulmonary;  Laterality: N/A;    REVIEW OF SYSTEMS:  Constitutional: negative Eyes: negative Ears, nose, mouth, throat, and face: negative Respiratory: negative Cardiovascular: negative Gastrointestinal: negative Genitourinary:negative Integument/breast: negative Hematologic/lymphatic: negative Musculoskeletal:negative Neurological: negative Behavioral/Psych: negative Endocrine: negative Allergic/Immunologic: negative   PHYSICAL EXAMINATION: General appearance: alert, cooperative, and no distress Head: Normocephalic, without obvious abnormality, atraumatic Neck: no adenopathy, no JVD, supple, symmetrical, trachea midline, and thyroid not enlarged, symmetric, no tenderness/mass/nodules Lymph nodes: Cervical, supraclavicular, and axillary nodes normal. Resp: clear to auscultation bilaterally Back: symmetric, no curvature. ROM normal. No CVA tenderness. Cardio: regular rate and rhythm, S1, S2 normal, no murmur, click, rub or gallop GI: soft, non-tender; bowel sounds normal; no masses,  no organomegaly Extremities: extremities normal, atraumatic, no cyanosis or edema Neurologic: Alert and oriented X 3, normal strength and tone.  Normal symmetric reflexes. Normal coordination and gait  ECOG PERFORMANCE STATUS: 1 - Symptomatic but completely ambulatory  Blood pressure (!) 104/58, pulse 73, temperature (!) 96.4 F (35.8 C), temperature source Tympanic, resp. rate 19, height 6' (1.829 m), weight 165 lb 1.6 oz (74.9 kg), SpO2 100 %.  LABORATORY DATA: Lab Results  Component Value Date   WBC 4.5 01/01/2021   HGB 10.7 (L) 01/01/2021   HCT 31.6 (L) 01/01/2021   MCV 84.7 01/01/2021   PLT 169 01/01/2021      Chemistry      Component Value Date/Time   NA 137 12/11/2020 0907   K 4.0 12/11/2020 0907   CL 103 12/11/2020 0907   CO2 23 12/11/2020 0907   BUN 9 12/11/2020 0907   CREATININE 1.10 12/11/2020 0907   CREATININE 0.88 02/21/2020 1115      Component Value Date/Time   CALCIUM 8.8 (L) 12/11/2020 0907   ALKPHOS 49 12/11/2020 0907   AST 39 12/11/2020 0907   ALT 51 (H) 12/11/2020 0907   BILITOT 0.4 12/11/2020 0907       RADIOGRAPHIC STUDIES: CT Chest W Contrast  Result Date: 12/29/2020 CLINICAL DATA:  Non-small cell lung cancer staging. EXAM: CT CHEST, ABDOMEN, AND PELVIS WITH CONTRAST TECHNIQUE: Multidetector CT imaging of the  chest, abdomen and pelvis was performed following the standard protocol during bolus administration of intravenous contrast. CONTRAST:  71mL OMNIPAQUE IOHEXOL 350 MG/ML SOLN COMPARISON:  None. FINDINGS: CT CHEST FINDINGS Cardiovascular: Port in the anterior chest wall with tip in distal SVC. No significant vascular findings. Normal heart size. No pericardial effusion. Mediastinum/Nodes: No axillary adenopathy. Small LEFT paratracheal node at the level of the great vessels measures 6 mm short axis (image 15/2) compared to 9 mm. Small RIGHT hilar node measures 9 mm (image 35/2 unchanged. Lungs/Pleura: Spiculated nodule at the RIGHT lung apex measuring 11 mm unchanged (image 37/4). Elongated angular nodule in the RIGHT upper lobe measures 10 mm x 4 mm compared to 10 mm x 4.2 mm. Nodule in the  superior segment of the RIGHT lower lobe measures 17 mm x 12 mm compared to 18 mm x 12 mm for no change. Ground-glass nodule in the RIGHT middle lobe measures 6 mm on image 82 unchanged. Ground-glass nodule in the RIGHT lower lobe measures 22 mm compared to 23 mm. Cavitary nodule in the LEFT lower lobe with peripheral nodularity measures 25 by 22 mm compared with 28 x 34 mm. Visually there is little change. Musculoskeletal: No aggressive osseous lesion. CT ABDOMEN AND PELVIS FINDINGS Hepatobiliary: No focal hepatic lesion. No biliary ductal dilatation. Gallbladder is normal. Common bile duct is normal. Pancreas: Pancreas is normal. No ductal dilatation. No pancreatic inflammation. Spleen: Normal spleen Adrenals/urinary tract: Adrenal glands and kidneys are normal. The ureters and bladder normal. Stomach/Bowel: Stomach, small bowel, appendix, and cecum are normal. The colon and rectosigmoid colon are normal. Vascular/Lymphatic: Abdominal aorta is normal caliber with atherosclerotic calcification. There is no retroperitoneal or periportal lymphadenopathy. No pelvic lymphadenopathy. Reproductive: Un remark Other: no peritoneal or omental metastasis. Musculoskeletal: No aggressive osseous lesion. IMPRESSION: Chest Impression: 1. Stable ground-glass nodules in the RIGHT lung. 2. Angular nodule in the RIGHT upper lobe presumed treatment site is stable. 3. Suspicious cavitary nodular lesion in the LEFT lower lobe is unchanged. 4. All pulmonary lesions are suspicious for adenocarcinoma. 5. Small paratracheal node unchanged. No new adenopathy in the mediastinum. Abdomen / Pelvis Impression: 1. No evidence of metastatic disease in the abdomen pelvis. 2.  Aortic Atherosclerosis (ICD10-I70.0). Electronically Signed   By: Suzy Bouchard M.D.   On: 12/29/2020 17:28   CT Abdomen Pelvis W Contrast  Result Date: 12/29/2020 CLINICAL DATA:  Non-small cell lung cancer staging. EXAM: CT CHEST, ABDOMEN, AND PELVIS WITH CONTRAST  TECHNIQUE: Multidetector CT imaging of the chest, abdomen and pelvis was performed following the standard protocol during bolus administration of intravenous contrast. CONTRAST:  90mL OMNIPAQUE IOHEXOL 350 MG/ML SOLN COMPARISON:  None. FINDINGS: CT CHEST FINDINGS Cardiovascular: Port in the anterior chest wall with tip in distal SVC. No significant vascular findings. Normal heart size. No pericardial effusion. Mediastinum/Nodes: No axillary adenopathy. Small LEFT paratracheal node at the level of the great vessels measures 6 mm short axis (image 15/2) compared to 9 mm. Small RIGHT hilar node measures 9 mm (image 35/2 unchanged. Lungs/Pleura: Spiculated nodule at the RIGHT lung apex measuring 11 mm unchanged (image 37/4). Elongated angular nodule in the RIGHT upper lobe measures 10 mm x 4 mm compared to 10 mm x 4.2 mm. Nodule in the superior segment of the RIGHT lower lobe measures 17 mm x 12 mm compared to 18 mm x 12 mm for no change. Ground-glass nodule in the RIGHT middle lobe measures 6 mm on image 82 unchanged. Ground-glass nodule in the RIGHT lower lobe  measures 22 mm compared to 23 mm. Cavitary nodule in the LEFT lower lobe with peripheral nodularity measures 25 by 22 mm compared with 28 x 34 mm. Visually there is little change. Musculoskeletal: No aggressive osseous lesion. CT ABDOMEN AND PELVIS FINDINGS Hepatobiliary: No focal hepatic lesion. No biliary ductal dilatation. Gallbladder is normal. Common bile duct is normal. Pancreas: Pancreas is normal. No ductal dilatation. No pancreatic inflammation. Spleen: Normal spleen Adrenals/urinary tract: Adrenal glands and kidneys are normal. The ureters and bladder normal. Stomach/Bowel: Stomach, small bowel, appendix, and cecum are normal. The colon and rectosigmoid colon are normal. Vascular/Lymphatic: Abdominal aorta is normal caliber with atherosclerotic calcification. There is no retroperitoneal or periportal lymphadenopathy. No pelvic lymphadenopathy.  Reproductive: Un remark Other: no peritoneal or omental metastasis. Musculoskeletal: No aggressive osseous lesion. IMPRESSION: Chest Impression: 1. Stable ground-glass nodules in the RIGHT lung. 2. Angular nodule in the RIGHT upper lobe presumed treatment site is stable. 3. Suspicious cavitary nodular lesion in the LEFT lower lobe is unchanged. 4. All pulmonary lesions are suspicious for adenocarcinoma. 5. Small paratracheal node unchanged. No new adenopathy in the mediastinum. Abdomen / Pelvis Impression: 1. No evidence of metastatic disease in the abdomen pelvis. 2.  Aortic Atherosclerosis (ICD10-I70.0). Electronically Signed   By: Suzy Bouchard M.D.   On: 12/29/2020 17:28     ASSESSMENT AND PLAN: This is a very pleasant 65 years old African-American male recently diagnosed with a stage IV (T3, N2, M1c)  non-small cell lung cancer, adenocarcinoma presented with right upper lobe lung mass in addition to right lower lobe, left lower lobe in addition to right hilar and mediastinal lymphadenopathy in addition to metastatic brain lesions diagnosed in October 2021. The patient has molecular studies by [employer] that showed no actionable mutations. He underwent SRS treatment to his brain lesion. The patient is currently undergoing systemic chemotherapy with carboplatin for AUC of 5, Alimta 500 mg/M2 and Keytruda 200 mg IV every 3 weeks status post 12 cycles.  Starting from cycle #5 the patient will be on maintenance treatment with Alimta and Keytruda every 3 weeks. The patient has been tolerating this treatment well with no concerning adverse effects. He had repeat CT scan of the chest, abdomen pelvis performed recently.  I personally and independently reviewed the scan and discussed the results with the patient and his wife. His scan showed no concerning findings for disease progression. I recommended for him to continue his current maintenance treatment with Alimta and Keytruda and he will proceed with  cycle #13 today. I will see the patient back for follow-up visit in 3 weeks for evaluation before the next cycle of his treatment. He was advised to call immediately if he has any other concerning symptoms in the interval.   The patient voices understanding of current disease status and treatment options and is in agreement with the current care plan.  All questions were answered. The patient knows to call the clinic with any problems, questions or concerns. We can certainly see the patient much sooner if necessary.  Disclaimer: This note was dictated with voice recognition software. Similar sounding words can inadvertently be transcribed and may not be corrected upon review.

## 2021-01-22 ENCOUNTER — Inpatient Hospital Stay: Payer: 59

## 2021-01-22 ENCOUNTER — Inpatient Hospital Stay: Payer: 59 | Attending: Internal Medicine | Admitting: Internal Medicine

## 2021-01-22 ENCOUNTER — Other Ambulatory Visit: Payer: Self-pay

## 2021-01-22 VITALS — BP 104/58 | HR 78 | Temp 98.2°F | Resp 17 | Wt 163.2 lb

## 2021-01-22 DIAGNOSIS — C3431 Malignant neoplasm of lower lobe, right bronchus or lung: Secondary | ICD-10-CM | POA: Insufficient documentation

## 2021-01-22 DIAGNOSIS — Z5111 Encounter for antineoplastic chemotherapy: Secondary | ICD-10-CM | POA: Diagnosis present

## 2021-01-22 DIAGNOSIS — C3432 Malignant neoplasm of lower lobe, left bronchus or lung: Secondary | ICD-10-CM | POA: Insufficient documentation

## 2021-01-22 DIAGNOSIS — C7931 Secondary malignant neoplasm of brain: Secondary | ICD-10-CM | POA: Diagnosis not present

## 2021-01-22 DIAGNOSIS — Z79899 Other long term (current) drug therapy: Secondary | ICD-10-CM | POA: Insufficient documentation

## 2021-01-22 DIAGNOSIS — C3491 Malignant neoplasm of unspecified part of right bronchus or lung: Secondary | ICD-10-CM

## 2021-01-22 DIAGNOSIS — Z95828 Presence of other vascular implants and grafts: Secondary | ICD-10-CM

## 2021-01-22 DIAGNOSIS — Z5112 Encounter for antineoplastic immunotherapy: Secondary | ICD-10-CM | POA: Insufficient documentation

## 2021-01-22 DIAGNOSIS — C3411 Malignant neoplasm of upper lobe, right bronchus or lung: Secondary | ICD-10-CM | POA: Diagnosis present

## 2021-01-22 LAB — CMP (CANCER CENTER ONLY)
ALT: 46 U/L — ABNORMAL HIGH (ref 0–44)
AST: 37 U/L (ref 15–41)
Albumin: 3.7 g/dL (ref 3.5–5.0)
Alkaline Phosphatase: 50 U/L (ref 38–126)
Anion gap: 8 (ref 5–15)
BUN: 16 mg/dL (ref 8–23)
CO2: 25 mmol/L (ref 22–32)
Calcium: 9.5 mg/dL (ref 8.9–10.3)
Chloride: 104 mmol/L (ref 98–111)
Creatinine: 1.06 mg/dL (ref 0.61–1.24)
GFR, Estimated: >60 mL/min (ref >60)
Glucose, Bld: 80 mg/dL (ref 70–99)
Potassium: 3.8 mmol/L (ref 3.5–5.1)
Sodium: 137 mmol/L (ref 135–145)
Total Bilirubin: 0.5 mg/dL (ref 0.3–1.2)
Total Protein: 7.6 g/dL (ref 6.5–8.1)

## 2021-01-22 LAB — CBC WITH DIFFERENTIAL (CANCER CENTER ONLY)
Abs Immature Granulocytes: 0.02 10*3/uL (ref 0.00–0.07)
Basophils Absolute: 0 10*3/uL (ref 0.0–0.1)
Basophils Relative: 1 %
Eosinophils Absolute: 0.1 10*3/uL (ref 0.0–0.5)
Eosinophils Relative: 2 %
HCT: 33.3 % — ABNORMAL LOW (ref 39.0–52.0)
Hemoglobin: 11.5 g/dL — ABNORMAL LOW (ref 13.0–17.0)
Immature Granulocytes: 0 %
Lymphocytes Relative: 37 %
Lymphs Abs: 2 10*3/uL (ref 0.7–4.0)
MCH: 29.1 pg (ref 26.0–34.0)
MCHC: 34.5 g/dL (ref 30.0–36.0)
MCV: 84.3 fL (ref 80.0–100.0)
Monocytes Absolute: 0.8 10*3/uL (ref 0.1–1.0)
Monocytes Relative: 14 %
Neutro Abs: 2.6 10*3/uL (ref 1.7–7.7)
Neutrophils Relative %: 46 %
Platelet Count: 150 10*3/uL (ref 150–400)
RBC: 3.95 MIL/uL — ABNORMAL LOW (ref 4.22–5.81)
RDW: 15.8 % — ABNORMAL HIGH (ref 11.5–15.5)
WBC Count: 5.6 10*3/uL (ref 4.0–10.5)
nRBC: 0 % (ref 0.0–0.2)

## 2021-01-22 LAB — TSH: TSH: 1.206 u[IU]/mL (ref 0.320–4.118)

## 2021-01-22 MED ORDER — SODIUM CHLORIDE 0.9 % IV SOLN
500.0000 mg/m2 | Freq: Once | INTRAVENOUS | Status: AC
  Administered 2021-01-22: 900 mg via INTRAVENOUS
  Filled 2021-01-22: qty 20

## 2021-01-22 MED ORDER — SODIUM CHLORIDE 0.9 % IV SOLN
Freq: Once | INTRAVENOUS | Status: AC
  Filled 2021-01-22: qty 250

## 2021-01-22 MED ORDER — HEPARIN SOD (PORK) LOCK FLUSH 100 UNIT/ML IV SOLN
500.0000 [IU] | Freq: Once | INTRAVENOUS | Status: AC | PRN
  Administered 2021-01-22: 500 [IU]
  Filled 2021-01-22: qty 5

## 2021-01-22 MED ORDER — PROCHLORPERAZINE MALEATE 10 MG PO TABS
10.0000 mg | ORAL_TABLET | Freq: Once | ORAL | Status: AC
  Administered 2021-01-22: 10 mg via ORAL

## 2021-01-22 MED ORDER — SODIUM CHLORIDE 0.9% FLUSH
10.0000 mL | INTRAVENOUS | Status: DC | PRN
  Administered 2021-01-22: 10 mL
  Filled 2021-01-22: qty 10

## 2021-01-22 MED ORDER — SODIUM CHLORIDE 0.9 % IV SOLN
200.0000 mg | Freq: Once | INTRAVENOUS | Status: AC
  Administered 2021-01-22: 200 mg via INTRAVENOUS
  Filled 2021-01-22: qty 8

## 2021-01-22 MED ORDER — SODIUM CHLORIDE 0.9% FLUSH
10.0000 mL | Freq: Once | INTRAVENOUS | Status: AC
  Administered 2021-01-22: 10 mL
  Filled 2021-01-22: qty 10

## 2021-01-22 MED ORDER — PROCHLORPERAZINE MALEATE 10 MG PO TABS
ORAL_TABLET | ORAL | Status: AC
  Filled 2021-01-22: qty 1

## 2021-01-22 NOTE — Progress Notes (Signed)
[facility] [phone]) [phone]   Fax:([phone]  OFFICE PROGRESS NOTE  [name], MD [name] [id number]  DIAGNOSIS: Stage IV (T3, N2, M1c) non-small cell lung cancer favoring adenocarcinoma presented with right upper lobe lung mass in addition to right lower lobe, left lower lobe in addition to right hilar and mediastinal lymphadenopathy in addition to metastatic brain lesions diagnosed in October 2021.  Molecular studies by [employer]:  [id number], 9.5%,   PRIOR THERAPY: SRS to 3 brain lesion under the care of Dr. Lisbeth Renshaw.  Scheduled for April 20, 2020  CURRENT THERAPY: Systemic chemotherapy with carboplatin for AUC of 5, Alimta 500 mg/M2 and Keytruda 200 mg IV every 3 weeks.  First dose April 24, 2020.  Status post 13 cycles.  Starting from cycle #5 the patient will be on maintenance treatment with Alimta and Keytruda every 3 weeks.  INTERVAL HISTORY: Nicolas Allen 65 y.o. male returns to the clinic today for follow-up visit.  The patient is feeling fine today.  He denied having any current chest pain, shortness of breath, cough or hemoptysis.  He has no nausea, vomiting, diarrhea or constipation.  He has no fever or chills.  He has no headache or visual changes.  He lost 2 pounds since his last visit.  The patient continues to tolerate his maintenance treatment fairly well.  He is here for evaluation before starting cycle #14.  MEDICAL HISTORY: Past Medical History:  Diagnosis Date   Anemia 06/24/2012   ANXIETY 02/03/2007   Cancer ([place])    Cervical disc disease 02/12/2012   S/p surgury jan 2013   Erectile dysfunction 02/11/2011   GERD (gastroesophageal reflux disease)    GLUCOSE INTOLERANCE 01/04/2008   HYPERLIPIDEMIA 02/03/2007   HYPERTENSION 02/03/2007   IBS (irritable bowel syndrome)    Impaired glucose tolerance 02/11/2011   Smoker 08/22/2014   Substance abuse ([place]) 2001   sober for 16 yrs    ALLERGIES:  has No Known  Allergies.  MEDICATIONS:  Current Outpatient Medications  Medication Sig Dispense Refill   amLODipine-benazepril (LOTREL) 10-20 MG capsule TAKE 1 CAPSULE BY MOUTH ONCE DAILY 90 capsule 3   aspirin EC 81 MG tablet Take 81 mg by mouth daily. Swallow whole.     b complex vitamins tablet Take 1 tablet by mouth daily.     cholecalciferol (VITAMIN D3) 25 MCG (1000 UNIT) tablet Take 1,000 Units by mouth daily.     folic acid (FOLVITE) 1 MG tablet Take 1 tablet (1 mg total) by mouth daily. 30 tablet 4   Garlic 0488 MG CAPS Take 1,000 mg by mouth daily.      ibuprofen (ADVIL) 200 MG tablet Take 200 mg by mouth every 6 (six) hours as needed.     lidocaine-prilocaine (EMLA) cream Apply to the Port-A-Cath site 30-60 minutes before chemotherapy. 30 g 0   Omega-3 Fatty Acids (FISH OIL PO) Take 1 capsule by mouth daily.      polyethylene glycol (MIRALAX / GLYCOLAX) 17 g packet Take 17 g by mouth daily as needed.     pravastatin (PRAVACHOL) 40 MG tablet TAKE 1 TABLET BY MOUTH ONCE DAILY 90 tablet 3   prochlorperazine (COMPAZINE) 10 MG tablet Take 1 tablet (10 mg total) by mouth every 6 (six) hours as needed for nausea or vomiting. 30 tablet 0   vardenafil (LEVITRA) 20 MG tablet TAKE 1 TABLET BY MOUTH AS NEEDED FOR  ERECTILE  DYSFUNCTION  10 tablet 5   No current facility-administered medications for this visit.    SURGICAL HISTORY:  Past Surgical History:  Procedure Laterality Date   BRONCHIAL BIOPSY  03/30/2020   Procedure: BRONCHIAL BIOPSIES;  Surgeon: Garner Nash, DO;  Location: [facility];  Service: Pulmonary;;   BRONCHIAL BRUSHINGS  03/30/2020   Procedure: BRONCHIAL BRUSHINGS;  Surgeon: Garner Nash, DO;  Location: [facility];  Service: Pulmonary;;   BRONCHIAL NEEDLE ASPIRATION BIOPSY  03/30/2020   Procedure: BRONCHIAL NEEDLE ASPIRATION BIOPSIES;  Surgeon: Garner Nash, DO;  Location: [facility];  Service: Pulmonary;;   BRONCHIAL WASHINGS  03/30/2020   Procedure: BRONCHIAL  WASHINGS;  Surgeon: Garner Nash, DO;  Location: [facility];  Service: Pulmonary;;   COLONOSCOPY  2007   IR IMAGING GUIDED PORT INSERTION  04/23/2020   neck fusion  2012   C4   NO PAST SURGERIES     VIDEO BRONCHOSCOPY WITH ENDOBRONCHIAL NAVIGATION N/A 03/30/2020   Procedure: VIDEO BRONCHOSCOPY WITH ENDOBRONCHIAL NAVIGATION;  Surgeon: Garner Nash, DO;  Location: [facility];  Service: Pulmonary;  Laterality: N/A;   VIDEO BRONCHOSCOPY WITH ENDOBRONCHIAL ULTRASOUND N/A 03/30/2020   Procedure: VIDEO BRONCHOSCOPY WITH ENDOBRONCHIAL ULTRASOUND;  Surgeon: Garner Nash, DO;  Location: [facility];  Service: Pulmonary;  Laterality: N/A;    REVIEW OF SYSTEMS:  A comprehensive review of systems was negative.   PHYSICAL EXAMINATION: General appearance: alert, cooperative, and no distress Head: Normocephalic, without obvious abnormality, atraumatic Neck: no adenopathy, no JVD, supple, symmetrical, trachea midline, and thyroid not enlarged, symmetric, no tenderness/mass/nodules Lymph nodes: Cervical, supraclavicular, and axillary nodes normal. Resp: clear to auscultation bilaterally Back: symmetric, no curvature. ROM normal. No CVA tenderness. Cardio: regular rate and rhythm, S1, S2 normal, no murmur, click, rub or gallop GI: soft, non-tender; bowel sounds normal; no masses,  no organomegaly Extremities: extremities normal, atraumatic, no cyanosis or edema  ECOG PERFORMANCE STATUS: 0 - Asymptomatic  Blood pressure (!) 104/58, pulse 78, temperature 98.2 F (36.8 C), temperature source Oral, resp. rate 17, weight 163 lb 3.2 oz (74 kg), SpO2 100 %.  LABORATORY DATA: Lab Results  Component Value Date   WBC 5.6 01/22/2021   HGB 11.5 (L) 01/22/2021   HCT 33.3 (L) 01/22/2021   MCV 84.3 01/22/2021   PLT 150 01/22/2021      Chemistry      Component Value Date/Time   NA 140 01/01/2021 0938   K 4.2 01/01/2021 0938   CL 105 01/01/2021 0938   CO2 28 01/01/2021 0938   BUN 14  01/01/2021 0938   CREATININE 1.02 01/01/2021 0938   CREATININE 0.88 02/21/2020 1115      Component Value Date/Time   CALCIUM 9.1 01/01/2021 0938   ALKPHOS 53 01/01/2021 0938   AST 38 01/01/2021 0938   ALT 40 01/01/2021 0938   BILITOT 0.3 01/01/2021 0938       RADIOGRAPHIC STUDIES: CT Chest W Contrast  Result Date: 12/29/2020 CLINICAL DATA:  Non-small cell lung cancer staging. EXAM: CT CHEST, ABDOMEN, AND PELVIS WITH CONTRAST TECHNIQUE: Multidetector CT imaging of the chest, abdomen and pelvis was performed following the standard protocol during bolus administration of intravenous contrast. CONTRAST:  90mL OMNIPAQUE IOHEXOL 350 MG/ML SOLN COMPARISON:  None. FINDINGS: CT CHEST FINDINGS Cardiovascular: Port in the anterior chest wall with tip in distal SVC. No significant vascular findings. Normal heart size. No pericardial effusion. Mediastinum/Nodes: No axillary adenopathy. Small LEFT paratracheal node at the level of the great vessels measures 6 mm short  axis (image 15/2) compared to 9 mm. Small RIGHT hilar node measures 9 mm (image 35/2 unchanged. Lungs/Pleura: Spiculated nodule at the RIGHT lung apex measuring 11 mm unchanged (image 37/4). Elongated angular nodule in the RIGHT upper lobe measures 10 mm x 4 mm compared to 10 mm x 4.2 mm. Nodule in the superior segment of the RIGHT lower lobe measures 17 mm x 12 mm compared to 18 mm x 12 mm for no change. Ground-glass nodule in the RIGHT middle lobe measures 6 mm on image 82 unchanged. Ground-glass nodule in the RIGHT lower lobe measures 22 mm compared to 23 mm. Cavitary nodule in the LEFT lower lobe with peripheral nodularity measures 25 by 22 mm compared with 28 x 34 mm. Visually there is little change. Musculoskeletal: No aggressive osseous lesion. CT ABDOMEN AND PELVIS FINDINGS Hepatobiliary: No focal hepatic lesion. No biliary ductal dilatation. Gallbladder is normal. Common bile duct is normal. Pancreas: Pancreas is normal. No ductal  dilatation. No pancreatic inflammation. Spleen: Normal spleen Adrenals/urinary tract: Adrenal glands and kidneys are normal. The ureters and bladder normal. Stomach/Bowel: Stomach, small bowel, appendix, and cecum are normal. The colon and rectosigmoid colon are normal. Vascular/Lymphatic: Abdominal aorta is normal caliber with atherosclerotic calcification. There is no retroperitoneal or periportal lymphadenopathy. No pelvic lymphadenopathy. Reproductive: Un remark Other: no peritoneal or omental metastasis. Musculoskeletal: No aggressive osseous lesion. IMPRESSION: Chest Impression: 1. Stable ground-glass nodules in the RIGHT lung. 2. Angular nodule in the RIGHT upper lobe presumed treatment site is stable. 3. Suspicious cavitary nodular lesion in the LEFT lower lobe is unchanged. 4. All pulmonary lesions are suspicious for adenocarcinoma. 5. Small paratracheal node unchanged. No new adenopathy in the mediastinum. Abdomen / Pelvis Impression: 1. No evidence of metastatic disease in the abdomen pelvis. 2.  Aortic Atherosclerosis (ICD10-I70.0). Electronically Signed   By: Suzy Bouchard M.D.   On: 12/29/2020 17:28   CT Abdomen Pelvis W Contrast  Result Date: 12/29/2020 CLINICAL DATA:  Non-small cell lung cancer staging. EXAM: CT CHEST, ABDOMEN, AND PELVIS WITH CONTRAST TECHNIQUE: Multidetector CT imaging of the chest, abdomen and pelvis was performed following the standard protocol during bolus administration of intravenous contrast. CONTRAST:  66mL OMNIPAQUE IOHEXOL 350 MG/ML SOLN COMPARISON:  None. FINDINGS: CT CHEST FINDINGS Cardiovascular: Port in the anterior chest wall with tip in distal SVC. No significant vascular findings. Normal heart size. No pericardial effusion. Mediastinum/Nodes: No axillary adenopathy. Small LEFT paratracheal node at the level of the great vessels measures 6 mm short axis (image 15/2) compared to 9 mm. Small RIGHT hilar node measures 9 mm (image 35/2 unchanged. Lungs/Pleura:  Spiculated nodule at the RIGHT lung apex measuring 11 mm unchanged (image 37/4). Elongated angular nodule in the RIGHT upper lobe measures 10 mm x 4 mm compared to 10 mm x 4.2 mm. Nodule in the superior segment of the RIGHT lower lobe measures 17 mm x 12 mm compared to 18 mm x 12 mm for no change. Ground-glass nodule in the RIGHT middle lobe measures 6 mm on image 82 unchanged. Ground-glass nodule in the RIGHT lower lobe measures 22 mm compared to 23 mm. Cavitary nodule in the LEFT lower lobe with peripheral nodularity measures 25 by 22 mm compared with 28 x 34 mm. Visually there is little change. Musculoskeletal: No aggressive osseous lesion. CT ABDOMEN AND PELVIS FINDINGS Hepatobiliary: No focal hepatic lesion. No biliary ductal dilatation. Gallbladder is normal. Common bile duct is normal. Pancreas: Pancreas is normal. No ductal dilatation. No pancreatic inflammation. Spleen: Normal  spleen Adrenals/urinary tract: Adrenal glands and kidneys are normal. The ureters and bladder normal. Stomach/Bowel: Stomach, small bowel, appendix, and cecum are normal. The colon and rectosigmoid colon are normal. Vascular/Lymphatic: Abdominal aorta is normal caliber with atherosclerotic calcification. There is no retroperitoneal or periportal lymphadenopathy. No pelvic lymphadenopathy. Reproductive: Un remark Other: no peritoneal or omental metastasis. Musculoskeletal: No aggressive osseous lesion. IMPRESSION: Chest Impression: 1. Stable ground-glass nodules in the RIGHT lung. 2. Angular nodule in the RIGHT upper lobe presumed treatment site is stable. 3. Suspicious cavitary nodular lesion in the LEFT lower lobe is unchanged. 4. All pulmonary lesions are suspicious for adenocarcinoma. 5. Small paratracheal node unchanged. No new adenopathy in the mediastinum. Abdomen / Pelvis Impression: 1. No evidence of metastatic disease in the abdomen pelvis. 2.  Aortic Atherosclerosis (ICD10-I70.0). Electronically Signed   By: Suzy Bouchard  M.D.   On: 12/29/2020 17:28     ASSESSMENT AND PLAN: This is a very pleasant 65 years old African-American male recently diagnosed with a stage IV (T3, N2, M1c)  non-small cell lung cancer, adenocarcinoma presented with right upper lobe lung mass in addition to right lower lobe, left lower lobe in addition to right hilar and mediastinal lymphadenopathy in addition to metastatic brain lesions diagnosed in October 2021. The patient has molecular studies by [employer] that showed no actionable mutations. He underwent SRS treatment to his brain lesion. The patient is currently undergoing systemic chemotherapy with carboplatin for AUC of 5, Alimta 500 mg/M2 and Keytruda 200 mg IV every 3 weeks status post 13 cycles.  Starting from cycle #5 the patient will be on maintenance treatment with Alimta and Keytruda every 3 weeks. The patient has been tolerating his treatment well with no concerning adverse effects. I recommended for the patient to proceed with cycle #14 today as planned. He will come back for follow-up visit in 3 weeks for evaluation before the next cycle of his treatment. The patient was advised to call immediately if he has any other concerning symptoms in the interval. The patient voices understanding of current disease status and treatment options and is in agreement with the current care plan.  All questions were answered. The patient knows to call the clinic with any problems, questions or concerns. We can certainly see the patient much sooner if necessary.  Disclaimer: This note was dictated with voice recognition software. Similar sounding words can inadvertently be transcribed and may not be corrected upon review.

## 2021-01-22 NOTE — Patient Instructions (Signed)
[facility]   Discharge Instructions: Thank you for choosing [facility] to provide your oncology and hematology care.   If you have a lab appointment with the [facility], please go directly to the [facility] and check in at the registration area.   Wear comfortable clothing and clothing appropriate for easy access to any Portacath or PICC line.   We strive to give you quality time with your provider. You may need to reschedule your appointment if you arrive late (15 or more minutes).  Arriving late affects you and other patients whose appointments are after yours.  Also, if you miss three or more appointments without notifying the office, you may be dismissed from the clinic at the provider's discretion.      For prescription refill requests, have your pharmacy contact our office and allow 72 hours for refills to be completed.    Today you received the following chemotherapy and/or immunotherapy agents: Pemetrexed and Pembrolizumab      To help prevent nausea and vomiting after your treatment, we encourage you to take your nausea medication as directed.  BELOW ARE SYMPTOMS THAT SHOULD BE REPORTED IMMEDIATELY: *FEVER GREATER THAN 100.4 F (38 C) OR HIGHER *CHILLS OR SWEATING *NAUSEA AND VOMITING THAT IS NOT CONTROLLED WITH YOUR NAUSEA MEDICATION *UNUSUAL SHORTNESS OF BREATH *UNUSUAL BRUISING OR BLEEDING *URINARY PROBLEMS (pain or burning when urinating, or frequent urination) *BOWEL PROBLEMS (unusual diarrhea, constipation, pain near the anus) TENDERNESS IN MOUTH AND THROAT WITH OR WITHOUT PRESENCE OF ULCERS (sore throat, sores in mouth, or a toothache) UNUSUAL RASH, SWELLING OR PAIN  UNUSUAL VAGINAL DISCHARGE OR ITCHING   Items with * indicate a potential emergency and should be followed up as soon as possible or go to the Emergency Department if any problems should occur.  Please show the CHEMOTHERAPY ALERT CARD or IMMUNOTHERAPY ALERT  CARD at check-in to the Emergency Department and triage nurse.  Should you have questions after your visit or need to cancel or reschedule your appointment, please contact [facility]  Dept: [phone]  and follow the prompts.  Office hours are 8:00 a.m. to 4:30 p.m. Monday - Friday. Please note that voicemails left after 4:00 p.m. may not be returned until the following business day.  We are closed weekends and major holidays. You have access to a nurse at all times for urgent questions. Please call the main number to the clinic Dept: [phone] and follow the prompts.   For any non-urgent questions, you may also contact your provider using MyChart. We now offer e-Visits for anyone 77 and older to request care online for non-urgent symptoms. For details visit [email or web address].   Also download the MyChart app! Go to the app store, search "MyChart", open the app, select [facility], and log in with your MyChart username and password.  Due to Covid, a mask is required upon entering the hospital/clinic. If you do not have a mask, one will be given to you upon arrival. For doctor visits, patients may have 1 support person aged 66 or older with them. For treatment visits, patients cannot have anyone with them due to current Covid guidelines and our immunocompromised population.

## 2021-01-31 ENCOUNTER — Other Ambulatory Visit: Payer: Self-pay

## 2021-01-31 ENCOUNTER — Ambulatory Visit
Admission: RE | Admit: 2021-01-31 | Discharge: 2021-01-31 | Disposition: A | Discharge status: Home / Self Care | Payer: 59 | Source: Ambulatory Visit | Attending: Radiation Oncology | Admitting: Radiation Oncology

## 2021-01-31 DIAGNOSIS — C7931 Secondary malignant neoplasm of brain: Secondary | ICD-10-CM

## 2021-01-31 MED ORDER — SODIUM CHLORIDE 0.9% FLUSH
10.0000 mL | INTRAVENOUS | Status: DC | PRN
  Administered 2021-01-31: 10 mL via INTRAVENOUS

## 2021-01-31 MED ORDER — GADOBENATE DIMEGLUMINE 529 MG/ML IV SOLN
15.0000 mL | Freq: Once | INTRAVENOUS | Status: AC | PRN
  Administered 2021-01-31: 15 mL via INTRAVENOUS

## 2021-01-31 MED ORDER — HEPARIN SOD (PORK) LOCK FLUSH 100 UNIT/ML IV SOLN
500.0000 [IU] | Freq: Once | INTRAVENOUS | Status: AC
  Administered 2021-01-31: 500 [IU] via INTRAVENOUS

## 2021-02-01 ENCOUNTER — Encounter: Payer: Self-pay | Admitting: Radiation Oncology

## 2021-02-01 NOTE — Progress Notes (Signed)
Patient reports doing well. Denies any pain, fatigue, cough, shortness of breath, painful swallowing, choking, weight loss, and has a good appetite.  Meaningful use questions complete and patient notified of 3:00pm telephone appointment on 02/04/21 and expressed understanding of it.

## 2021-02-04 ENCOUNTER — Ambulatory Visit
Admission: RE | Admit: 2021-02-04 | Discharge: 2021-02-04 | Disposition: A | Discharge status: Home / Self Care | Payer: 59 | Source: Ambulatory Visit | Attending: Radiation Oncology | Admitting: Radiation Oncology

## 2021-02-04 DIAGNOSIS — C7931 Secondary malignant neoplasm of brain: Secondary | ICD-10-CM

## 2021-02-04 DIAGNOSIS — C3491 Malignant neoplasm of unspecified part of right bronchus or lung: Secondary | ICD-10-CM

## 2021-02-04 NOTE — Progress Notes (Signed)
Radiation Oncology         [phone] ________________________________  Outpatient Follow Up - Conducted via telephone due to current COVID-19 concerns for limiting patient exposure  I spoke with the patient to conduct this consult visit via telephone to spare the patient unnecessary potential exposure in the healthcare setting during the current COVID-19 pandemic. The patient was notified in advance and was offered a [place] meeting to allow for face to face communication but unfortunately reported that they did not have the appropriate resources/technology to support such a visit and instead preferred to proceed with a telephone visit.   Name: Nicolas Allen        MRN: [id number]  Date of Service: 02/04/2021 DOB: 02/14/1956  QP:[id number], Hunt Oris, MD  Curt Bears, MD     REFERRING PHYSICIAN: Curt Bears, MD   DIAGNOSIS: The primary encounter diagnosis was Non-small cell carcinoma of right lung, stage 4 ([place]). A diagnosis of Brain metastases [facility]) was also pertinent to this visit.   HISTORY OF PRESENT ILLNESS: Nicolas Allen is a 65 y.o. malewith a history of stage IV non-small cell lung cancer, adenocarcinoma involving multifocal disease in the lung and 3 brain metastases. He was diagnosed in the fall of 2021 and found to have metastatic disease in the lung and brain for which he has received stereotactic radiosurgery [facility]) with Dr. Lisbeth Renshaw and has continued on chemo/immunotherapy with Dr. Julien Nordmann.   Since his last visit he has continued with Keytruda/Alimta and had a repeat MRI on 01/31/21 that revealed stable post treatment changes in the midline vermis measuring 5 mm. The other two treated sites are no longer visible. He is contacted by phone to discuss these results. He continues to also have a right parotid lesion that is stable since October 2021.   PREVIOUS RADIATION THERAPY:  04/20/20 SRS Treatment: Each site below was treated to 20 Gy in 1 fraction PTV1 Rt Cerebellum  76mm PTV2Mid cerebellum 51mm PTV3Lt Parietal 3mm    PAST MEDICAL HISTORY:  Past Medical History:  Diagnosis Date   Anemia 06/24/2012   ANXIETY 02/03/2007   Cancer ([place])    Cervical disc disease 02/12/2012   S/p surgury jan 2013   Erectile dysfunction 02/11/2011   GERD (gastroesophageal reflux disease)    GLUCOSE INTOLERANCE 01/04/2008   HYPERLIPIDEMIA 02/03/2007   HYPERTENSION 02/03/2007   IBS (irritable bowel syndrome)    Impaired glucose tolerance 02/11/2011   Smoker 08/22/2014   Substance abuse ([place]) 2001   sober for 16 yrs       PAST SURGICAL HISTORY: Past Surgical History:  Procedure Laterality Date   BRONCHIAL BIOPSY  03/30/2020   Procedure: BRONCHIAL BIOPSIES;  Surgeon: Garner Nash, DO;  Location: [place] [facility];  Service: Pulmonary;;   BRONCHIAL BRUSHINGS  03/30/2020   Procedure: BRONCHIAL BRUSHINGS;  Surgeon: Garner Nash, DO;  Location: [place] [facility];  Service: Pulmonary;;   BRONCHIAL NEEDLE ASPIRATION BIOPSY  03/30/2020   Procedure: BRONCHIAL NEEDLE ASPIRATION BIOPSIES;  Surgeon: Garner Nash, DO;  Location: [place] [facility];  Service: Pulmonary;;   BRONCHIAL WASHINGS  03/30/2020   Procedure: BRONCHIAL WASHINGS;  Surgeon: Garner Nash, DO;  Location: [place] [facility];  Service: Pulmonary;;   COLONOSCOPY  2007   IR IMAGING GUIDED PORT INSERTION  04/23/2020   neck fusion  2012   C4   NO PAST SURGERIES     VIDEO BRONCHOSCOPY WITH ENDOBRONCHIAL NAVIGATION N/A 03/30/2020   Procedure: VIDEO BRONCHOSCOPY WITH ENDOBRONCHIAL NAVIGATION;  Surgeon: Garner Nash, DO;  Location: [facility];  Service: Pulmonary;  Laterality: N/A;   VIDEO BRONCHOSCOPY WITH ENDOBRONCHIAL ULTRASOUND N/A 03/30/2020   Procedure: VIDEO BRONCHOSCOPY WITH ENDOBRONCHIAL ULTRASOUND;  Surgeon: Garner Nash, DO;  Location: [place];  Service: Pulmonary;  Laterality: N/A;     FAMILY HISTORY:  Family History  Problem Relation Age of Onset   Cancer Mother        esophagus   Cancer Cousin     Stroke Other    Hypertension Other    Colon cancer Neg Hx      SOCIAL HISTORY:  reports that he quit smoking about a year ago. His smoking use included cigarettes. He has a 45.00 pack-year smoking history. He has quit using smokeless tobacco.  His smokeless tobacco use included chew. He reports that he does not drink alcohol and does not use drugs. The patient is married and resides in [place]. 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   ALLERGIES: Patient has no known allergies.   MEDICATIONS:  Current Outpatient Medications  Medication Sig Dispense Refill   amLODipine-benazepril (LOTREL) 10-20 MG capsule TAKE 1 CAPSULE BY MOUTH ONCE DAILY 90 capsule 3   aspirin EC 81 MG tablet Take 81 mg by mouth daily. Swallow whole.     b complex vitamins tablet Take 1 tablet by mouth daily.     cholecalciferol (VITAMIN D3) 25 MCG (1000 UNIT) tablet Take 1,000 Units by mouth daily.     folic acid (FOLVITE) 1 MG tablet Take 1 tablet (1 mg total) by mouth daily. 30 tablet 4   Garlic 7564 MG CAPS Take 1,000 mg by mouth daily.      lidocaine-prilocaine (EMLA) cream Apply to the Port-A-Cath site 30-60 minutes before chemotherapy. 30 g 0   Omega-3 Fatty Acids (FISH OIL PO) Take 1 capsule by mouth daily.      polyethylene glycol (MIRALAX / GLYCOLAX) 17 g packet Take 17 g by mouth daily as needed.     pravastatin (PRAVACHOL) 40 MG tablet TAKE 1 TABLET BY MOUTH ONCE DAILY 90 tablet 3   prochlorperazine (COMPAZINE) 10 MG tablet Take 1 tablet (10 mg total) by mouth every 6 (six) hours as needed for nausea or vomiting. 30 tablet 0   vardenafil (LEVITRA) 20 MG tablet TAKE 1 TABLET BY MOUTH AS NEEDED FOR  ERECTILE  DYSFUNCTION 10 tablet 5   ibuprofen (ADVIL) 200 MG tablet Take 200 mg by mouth every 6 (six) hours as needed. (Patient not taking: Reported on 02/01/2021)     No current facility-administered medications for this encounter.     REVIEW OF SYSTEMS: On review of systems  the patient reports he is doing well with cough that improves with taking Delsym or a candy/lozenge. He reports his breathing is stable. He denies any new trouble with activities. He is not having headaches, seizures, visual or auditory changes. No complaints are otherwise noted.  PHYSICAL EXAM:  Unable to assess given encounter type.    ECOG = 1  0 - Asymptomatic (Fully active, able to carry on all predisease activities without restriction)  1 - Symptomatic but completely ambulatory (Restricted in physically strenuous activity but ambulatory and able to carry out work of a light or sedentary nature. For example, light housework, office work)  2 - Symptomatic, <50% in bed during the day (Ambulatory and capable of all self care but unable to carry out any work activities. Up and about more than 50% of waking hours)  3 - Symptomatic, >50%  in bed, but not bedbound (Capable of only limited self-care, confined to bed or chair 50% or more of waking hours)  4 - Bedbound (Completely disabled. Cannot carry on any self-care. Totally confined to bed or chair)  5 - Death   Eustace Pen MM, Creech RH, Tormey DC, et al. [phone]). "Toxicity and response criteria of the [facility]". [place] Oncol. 5 (6): 649-55    LABORATORY DATA:  Lab Results  Component Value Date   WBC 5.6 01/22/2021   HGB 11.5 (L) 01/22/2021   HCT 33.3 (L) 01/22/2021   MCV 84.3 01/22/2021   PLT 150 01/22/2021   Lab Results  Component Value Date   NA 137 01/22/2021   K 3.8 01/22/2021   CL 104 01/22/2021   CO2 25 01/22/2021   Lab Results  Component Value Date   ALT 46 (H) 01/22/2021   AST 37 01/22/2021   ALKPHOS 50 01/22/2021   BILITOT 0.5 01/22/2021      RADIOGRAPHY: MR Brain W Wo Contrast  Result Date: 02/03/2021 CLINICAL DATA:  65 year old male with metastatic lung cancer. Three metastases treated with SRS in November 2021. EXAM: MRI HEAD WITHOUT AND WITH CONTRAST TECHNIQUE: Multiplanar,  multiecho pulse sequences of the brain and surrounding structures were obtained without and with intravenous contrast. CONTRAST:  66mL MULTIHANCE GADOBENATE DIMEGLUMINE 529 MG/ML IV SOLN COMPARISON:  Brain MRI 10/25/2020 and earlier. FINDINGS: Brain: Stable posterior midline enhancing vermis metastasis since February, 5 mm on series 12, image 44. No associated edema or mass effect. No other abnormal enhancement identified today. No dural thickening. Stable scattered patchy and confluent bilateral white matter T2 and FLAIR hyperintensity in both hemispheres. No superimposed restricted diffusion to suggest acute infarction. No midline shift, mass effect, ventriculomegaly, extra-axial collection or acute intracranial hemorrhage. Cervicomedullary junction and pituitary are within normal limits. No chronic cerebral blood products identified. Vascular: Major intracranial vascular flow voids are stable. Skull and upper cervical spine: Negative visible cervical spine. Visualized bone marrow signal is within normal limits. Sinuses/Orbits: Stable. Other: Mastoids remain clear. Visible internal auditory structures appear normal. Round and mildly lobulated T2 and STIR hyperintense T1 hypointense but enhancing mass of the posterior right parotid gland (series 15, image 6) is stable since October of last year. IMPRESSION: 1. Solitary residual enhancing 5 mm posterior vermis metastasis is stable since February. No associated edema or mass effect. No new metastatic disease. 2. 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 Size is stable since October 2021 arguing in favor of benignity, although confirmation with histology would be ideal. Electronically Signed   By: Genevie Ann M.D.   On: 02/03/2021 08:58        IMPRESSION/PLAN: 1. Stage IV non-small cell lung cancer, adenocarcinoma involving multifocal disease in the lung and brain metastases.The patient  continues to do well clinically and radiographically. He will continue Keytruda/Alimta with Dr. Julien Nordmann. We discussed repeating his MRI in 3 months for surveillance.  He will let us know if he has questions or concerns prior to his next visit.  2. Right parotid nodule. The patient continues to be asymptomatic and we will follow these expectantly.   Given current concerns for patient exposure during the COVID-19 pandemic, this encounter was conducted via telephone.  The patient has provided two factor identification and has given verbal consent for this type of encounter and has been advised to only accept a meeting of this type in a secure network  environment. The time spent during this encounter was 40 minutes including preparation, discussion, and coordination of the patient's care. The attendants for this meeting include Nicolas Allen  and Nicolas Allen a . During the encounter,  Nicolas Allen was located at [facility] Radiation Oncology Department.  Nicolas Allen was located at home.     Carola Rhine, PAC

## 2021-02-06 ENCOUNTER — Other Ambulatory Visit: Payer: Self-pay | Admitting: Radiation Therapy

## 2021-02-06 DIAGNOSIS — C7931 Secondary malignant neoplasm of brain: Secondary | ICD-10-CM

## 2021-02-09 NOTE — Progress Notes (Signed)
Nicolas Allen OFFICE PROGRESS NOTE  Nicolas Borg, MD Nicolas Allen [id number]  DIAGNOSIS: Stage IV (T3, N2, M1c) non-small cell lung cancer favoring adenocarcinoma presented with right upper lobe lung mass in addition to right lower lobe, left lower lobe in addition to right hilar and mediastinal lymphadenopathy in addition to metastatic brain lesions diagnosed in October 2021.   Molecular studies by Nicolas Allen:   [id number], 9.5%,    PRIOR THERAPY: SRS to 3 brain lesion under the care of Dr. Lisbeth Allen.  Completed on April 20, 2020  CURRENT THERAPY: Systemic chemotherapy with carboplatin for AUC of 5, Alimta 500 mg/M2 and Keytruda 200 mg IV every 3 weeks.  First dose April 24, 2020.  Status post 14 cycles.  Starting from cycle #5 the patient will be on maintenance treatment with Alimta and Keytruda every 3 weeks.  INTERVAL HISTORY: Nicolas Allen 65 y.o. male returns to the clinic today for a follow-up visit accompanied by his wife.  The patient is feeling well today without any concerning complaints except him and his wife noticed some hypopigmentation on his face which started a few weeks ago. It is not dry, itchy, or painful. It is localized to his face. He denies new medications. No blisters or drainage.  He tolerated his last cycle of treatment well without any concerning adverse side effects.  He denies any fever, chills, night sweats, or weight loss.  He denies any nausea, vomiting, diarrhea, or constipation.  He denies any chest pain, shortness of breath, or hemoptysis.  He has a mild baseline cough and states he uses cough drops or delsym. He denies any headache or visual changes.  He recently had a follow-up appointment with radiation oncology 2 to his history of metastatic disease to the brain. The patient is here today for evaluation and repeat blood work before starting cycle #15.  MEDICAL HISTORY: Past Medical History:  Diagnosis Date   Anemia  06/24/2012   ANXIETY 02/03/2007   Cancer ([place])    Cervical disc disease 02/12/2012   S/p surgury jan 2013   Erectile dysfunction 02/11/2011   GERD (gastroesophageal reflux disease)    GLUCOSE INTOLERANCE 01/04/2008   HYPERLIPIDEMIA 02/03/2007   HYPERTENSION 02/03/2007   IBS (irritable bowel syndrome)    Impaired glucose tolerance 02/11/2011   Smoker 08/22/2014   Substance abuse ([place]) 2001   sober for 16 yrs    ALLERGIES:  has No Known Allergies.  MEDICATIONS:  Current Outpatient Medications  Medication Sig Dispense Refill   amLODipine-benazepril (LOTREL) 10-20 MG capsule TAKE 1 CAPSULE BY MOUTH ONCE DAILY 90 capsule 3   aspirin EC 81 MG tablet Take 81 mg by mouth daily. Swallow whole.     b complex vitamins tablet Take 1 tablet by mouth daily.     cholecalciferol (VITAMIN D3) 25 MCG (1000 UNIT) tablet Take 1,000 Units by mouth daily.     folic acid (FOLVITE) 1 MG tablet Take 1 tablet (1 mg total) by mouth daily. 30 tablet 4   Garlic 8315 MG CAPS Take 1,000 mg by mouth daily.      lidocaine-prilocaine (EMLA) cream Apply to the Port-A-Cath site 30-60 minutes before chemotherapy. 30 g 0   Omega-3 Fatty Acids (FISH OIL PO) Take 1 capsule by mouth daily.      polyethylene glycol (MIRALAX / GLYCOLAX) 17 g packet Take 17 g by mouth daily as needed.     pravastatin (PRAVACHOL) 40 MG tablet TAKE 1 TABLET  BY MOUTH ONCE DAILY 90 tablet 3   prochlorperazine (COMPAZINE) 10 MG tablet Take 1 tablet (10 mg total) by mouth every 6 (six) hours as needed for nausea or vomiting. 30 tablet 0   vardenafil (LEVITRA) 20 MG tablet TAKE 1 TABLET BY MOUTH AS NEEDED FOR  ERECTILE  DYSFUNCTION 10 tablet 5   ibuprofen (ADVIL) 200 MG tablet Take 200 mg by mouth every 6 (six) hours as needed. (Patient not taking: Reported on 02/12/2021)     No current facility-administered medications for this visit.    SURGICAL HISTORY:  Past Surgical History:  Procedure Laterality Date   BRONCHIAL BIOPSY  03/30/2020   Procedure:  BRONCHIAL BIOPSIES;  Surgeon: Garner Nash, DO;  Location: [place] [facility];  Service: Pulmonary;;   BRONCHIAL BRUSHINGS  03/30/2020   Procedure: BRONCHIAL BRUSHINGS;  Surgeon: Garner Nash, DO;  Location: [place] [facility];  Service: Pulmonary;;   BRONCHIAL NEEDLE ASPIRATION BIOPSY  03/30/2020   Procedure: BRONCHIAL NEEDLE ASPIRATION BIOPSIES;  Surgeon: Garner Nash, DO;  Location: [place] [facility];  Service: Pulmonary;;   BRONCHIAL WASHINGS  03/30/2020   Procedure: BRONCHIAL WASHINGS;  Surgeon: Garner Nash, DO;  Location: [place] [facility];  Service: Pulmonary;;   COLONOSCOPY  2007   IR IMAGING GUIDED PORT INSERTION  04/23/2020   neck fusion  2012   C4   NO PAST SURGERIES     VIDEO BRONCHOSCOPY WITH ENDOBRONCHIAL NAVIGATION N/A 03/30/2020   Procedure: VIDEO BRONCHOSCOPY WITH ENDOBRONCHIAL NAVIGATION;  Surgeon: Garner Nash, DO;  Location: [place];  Service: Pulmonary;  Laterality: N/A;   VIDEO BRONCHOSCOPY WITH ENDOBRONCHIAL ULTRASOUND N/A 03/30/2020   Procedure: VIDEO BRONCHOSCOPY WITH ENDOBRONCHIAL ULTRASOUND;  Surgeon: Garner Nash, DO;  Location: [place];  Service: Pulmonary;  Laterality: N/A;    REVIEW OF SYSTEMS:   Review of Systems  Constitutional: Negative for appetite change, chills, fatigue, fever and unexpected weight change.  HENT:   Negative for mouth sores, nosebleeds, sore throat and trouble swallowing.   Eyes: Negative for eye problems and icterus.  Respiratory: Positive for baseline cough. Negative for hemoptysis, shortness of breath and wheezing.   Cardiovascular: Negative for chest pain and leg swelling.  Gastrointestinal: Negative for abdominal pain, constipation, diarrhea, nausea and vomiting.  Genitourinary: Negative for bladder incontinence, difficulty urinating, dysuria, frequency and hematuria.   Musculoskeletal: Negative for back pain, gait problem, neck pain and neck stiffness.  Skin: Positive for hypopigmentation.   Neurological: Negative for  dizziness, extremity weakness, gait problem, headaches, light-headedness and seizures.  Hematological: Negative for adenopathy. Does not bruise/bleed easily.  Psychiatric/Behavioral: Negative for confusion, depression and sleep disturbance. The patient is not nervous/anxious.     PHYSICAL EXAMINATION:  Blood pressure 117/62, pulse 73, temperature (!) 97.1 F (36.2 C), temperature source Tympanic, resp. rate 20, height 6' (1.829 m), weight 166 lb 4.8 oz (75.4 kg), SpO2 100 %.  ECOG PERFORMANCE STATUS: 1  Physical Exam  Constitutional: Oriented to person, place, and time and well-developed, well-nourished, and in no distress.  HENT:  Head: Normocephalic and atraumatic.  Mouth/Throat: Oropharynx is clear and moist. No oropharyngeal exudate.  Eyes: Conjunctivae are normal. Right eye exhibits no discharge. Left eye exhibits no discharge. No scleral icterus.  Neck: Normal range of motion. Neck supple.  Cardiovascular: Normal rate, regular rhythm, normal heart sounds and intact distal pulses.   Pulmonary/Chest: Effort normal and breath sounds normal. No respiratory distress. No wheezes. No rales.  Abdominal: Soft. Bowel sounds are normal. Exhibits no distension and no mass. There  is no tenderness.  Musculoskeletal: Normal range of motion. Exhibits no edema.  Lymphadenopathy:    No cervical adenopathy.  Neurological: Alert and oriented to person, place, and time. Exhibits normal muscle tone. Gait normal. Coordination normal.  Skin: Positive for hypopigmentation/patches on face only. Skin is warm and dry. Not diaphoretic. No erythema. No pallor.  Psychiatric: Mood, memory and judgment normal.  Vitals reviewed.  LABORATORY DATA: Lab Results  Component Value Date   WBC 4.5 02/12/2021   HGB 10.8 (L) 02/12/2021   HCT 32.0 (L) 02/12/2021   MCV 85.8 02/12/2021   PLT 149 (L) 02/12/2021      Chemistry      Component Value Date/Time   NA 138 02/12/2021 0908   K 3.7 02/12/2021 0908   CL 105  02/12/2021 0908   CO2 23 02/12/2021 0908   BUN 13 02/12/2021 0908   CREATININE 1.19 02/12/2021 0908   CREATININE 0.88 02/21/2020 1115      Component Value Date/Time   CALCIUM 8.9 02/12/2021 0908   ALKPHOS 54 02/12/2021 0908   AST 45 (H) 02/12/2021 0908   ALT 58 (H) 02/12/2021 0908   BILITOT 0.4 02/12/2021 0908       RADIOGRAPHIC STUDIES:  MR Brain W Wo Contrast  Result Date: 02/03/2021 CLINICAL DATA:  65 year old male with metastatic lung cancer. Three metastases treated with SRS in November 2021. EXAM: MRI HEAD WITHOUT AND WITH CONTRAST TECHNIQUE: Multiplanar, multiecho pulse sequences of the brain and surrounding structures were obtained without and with intravenous contrast. CONTRAST:  40mL MULTIHANCE GADOBENATE DIMEGLUMINE 529 MG/ML IV SOLN COMPARISON:  Brain MRI 10/25/2020 and earlier. FINDINGS: Brain: Stable posterior midline enhancing vermis metastasis since February, 5 mm on series 12, image 44. No associated edema or mass effect. No other abnormal enhancement identified today. No dural thickening. Stable scattered patchy and confluent bilateral white matter T2 and FLAIR hyperintensity in both hemispheres. No superimposed restricted diffusion to suggest acute infarction. No midline shift, mass effect, ventriculomegaly, extra-axial collection or acute intracranial hemorrhage. Cervicomedullary junction and pituitary are within normal limits. No chronic cerebral blood products identified. Vascular: Major intracranial vascular flow voids are stable. Skull and upper cervical spine: Negative visible cervical spine. Visualized bone marrow signal is within normal limits. Sinuses/Orbits: Stable. Other: Mastoids remain clear. Visible internal auditory structures appear normal. Round and mildly lobulated T2 and STIR hyperintense T1 hypointense but enhancing mass of the posterior right parotid gland (series 15, image 6) is stable since October of last year. IMPRESSION: 1. Solitary residual enhancing  5 mm posterior vermis metastasis is stable since February. No associated edema or mass effect. No new metastatic disease. 2. 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 Size is stable since October 2021 arguing in favor of benignity, although confirmation with histology would be ideal. Electronically Signed   By: Genevie Ann M.D.   On: 02/03/2021 08:58     ASSESSMENT/PLAN:  This is a very pleasant 65 year old African-American male diagnosed with stage IV (T3, N2, M1C) non-small cell lung cancer, adenocarcinoma.  He presented with a right upper lobe lung mass in addition to a right lower lobe, left lower lobe lung lesions with right hilar and mediastinal lymphadenopathy.  He also had metastatic disease to the brain.  He was diagnosed in October 2022.  His molecular studies by Nicolas Allen did not show any actionable mutations.  The patient underwent SRS treatment to the metastatic brain lesions under the care of Dr.  Moody.  This was completed on 04/20/2021.  The patient is currently undergoing systemic chemotherapy/immunotherapy.  He started with carboplatin for an AUC of 5, Alimta 500 mg per metered squared, Keytruda 200 mg IV every 3 weeks.  He is status post 14 cycles.  Starting from cycle #5, the patient has been on maintenance treatment with Alimta and Keytruda IV every 3 weeks.  Labs were reviewed.  Recommend that heproceed with cycle #15 today as scheduled.  I will arrange for restaging CT scan the chest, abdomen, and pelvis prior to starting his next cycle of treatment.  We will see him back for follow-up visit in 3 weeks for evaluation and to review his scan results before starting cycle #16.  Regarding the discoloration/hypopigmentation on his face, I am unsure of the etiology. The patient denies history of skin fungal infection. He is asymptomatic. I reviewed with Dr. Julien Nordmann who suggested that it may be from the  immunotherapy. The patient denies itching. I did suggest that the patient try selsun blue or head and shoulders should it be fungal in nature to see if it helps.   The patient was advised to call immediately if he has any concerning symptoms in the interval. The patient voices understanding of current disease status and treatment options and is in agreement with the current care plan. All questions were answered. The patient knows to call the clinic with any problems, questions or concerns. We can certainly see the patient much sooner if necessary   Orders Placed This Encounter  Procedures   CT Chest W Contrast    Standing Status:   Future    Standing Expiration Date:   02/12/2022    Order Specific Question:   If indicated for the ordered procedure, I authorize the administration of contrast media per Radiology protocol    Answer:   Yes    Order Specific Question:   Preferred imaging location?    Answer:   [facility]   CT Abdomen Pelvis W Contrast    Standing Status:   Future    Standing Expiration Date:   02/12/2022    Order Specific Question:   If indicated for the ordered procedure, I authorize the administration of contrast media per Radiology protocol    Answer:   Yes    Order Specific Question:   Preferred imaging location?    Answer:   [facility]    Order Specific Question:   Is Oral Contrast requested for this exam?    Answer:   Yes, Per Radiology protocol      The total time spent in the appointment was 20-29 minutes.   Johnsie Moscoso L Azrael Huss, PA-C 02/12/21

## 2021-02-12 ENCOUNTER — Other Ambulatory Visit: Payer: Self-pay

## 2021-02-12 ENCOUNTER — Encounter: Payer: Self-pay | Admitting: Physician Assistant

## 2021-02-12 ENCOUNTER — Inpatient Hospital Stay: Payer: 59

## 2021-02-12 ENCOUNTER — Inpatient Hospital Stay (HOSPITAL_BASED_OUTPATIENT_CLINIC_OR_DEPARTMENT_OTHER): Payer: 59 | Admitting: Physician Assistant

## 2021-02-12 VITALS — BP 117/62 | HR 73 | Temp 97.1°F | Resp 20 | Ht 72.0 in | Wt 166.3 lb

## 2021-02-12 DIAGNOSIS — C3491 Malignant neoplasm of unspecified part of right bronchus or lung: Secondary | ICD-10-CM

## 2021-02-12 DIAGNOSIS — Z5111 Encounter for antineoplastic chemotherapy: Secondary | ICD-10-CM

## 2021-02-12 DIAGNOSIS — R21 Rash and other nonspecific skin eruption: Secondary | ICD-10-CM

## 2021-02-12 DIAGNOSIS — Z5112 Encounter for antineoplastic immunotherapy: Secondary | ICD-10-CM | POA: Diagnosis not present

## 2021-02-12 DIAGNOSIS — Z95828 Presence of other vascular implants and grafts: Secondary | ICD-10-CM

## 2021-02-12 LAB — CBC WITH DIFFERENTIAL (CANCER CENTER ONLY)
Abs Immature Granulocytes: 0.02 10*3/uL (ref 0.00–0.07)
Basophils Absolute: 0 10*3/uL (ref 0.0–0.1)
Basophils Relative: 0 %
Eosinophils Absolute: 0.1 10*3/uL (ref 0.0–0.5)
Eosinophils Relative: 2 %
HCT: 32 % — ABNORMAL LOW (ref 39.0–52.0)
Hemoglobin: 10.8 g/dL — ABNORMAL LOW (ref 13.0–17.0)
Immature Granulocytes: 0 %
Lymphocytes Relative: 46 %
Lymphs Abs: 2 10*3/uL (ref 0.7–4.0)
MCH: 29 pg (ref 26.0–34.0)
MCHC: 33.8 g/dL (ref 30.0–36.0)
MCV: 85.8 fL (ref 80.0–100.0)
Monocytes Absolute: 0.5 10*3/uL (ref 0.1–1.0)
Monocytes Relative: 11 %
Neutro Abs: 1.8 10*3/uL (ref 1.7–7.7)
Neutrophils Relative %: 41 %
Platelet Count: 149 10*3/uL — ABNORMAL LOW (ref 150–400)
RBC: 3.73 MIL/uL — ABNORMAL LOW (ref 4.22–5.81)
RDW: 15.4 % (ref 11.5–15.5)
WBC Count: 4.5 10*3/uL (ref 4.0–10.5)
nRBC: 0 % (ref 0.0–0.2)

## 2021-02-12 LAB — CMP (CANCER CENTER ONLY)
ALT: 58 U/L — ABNORMAL HIGH (ref 0–44)
AST: 45 U/L — ABNORMAL HIGH (ref 15–41)
Albumin: 3.4 g/dL — ABNORMAL LOW (ref 3.5–5.0)
Alkaline Phosphatase: 54 U/L (ref 38–126)
Anion gap: 10 (ref 5–15)
BUN: 13 mg/dL (ref 8–23)
CO2: 23 mmol/L (ref 22–32)
Calcium: 8.9 mg/dL (ref 8.9–10.3)
Chloride: 105 mmol/L (ref 98–111)
Creatinine: 1.19 mg/dL (ref 0.61–1.24)
GFR, Estimated: >60 mL/min (ref >60)
Glucose, Bld: 178 mg/dL — ABNORMAL HIGH (ref 70–99)
Potassium: 3.7 mmol/L (ref 3.5–5.1)
Sodium: 138 mmol/L (ref 135–145)
Total Bilirubin: 0.4 mg/dL (ref 0.3–1.2)
Total Protein: 7 g/dL (ref 6.5–8.1)

## 2021-02-12 LAB — TSH: TSH: 1.13 u[IU]/mL (ref 0.320–4.118)

## 2021-02-12 MED ORDER — SODIUM CHLORIDE 0.9 % IV SOLN
500.0000 mg/m2 | Freq: Once | INTRAVENOUS | Status: AC
  Administered 2021-02-12: 900 mg via INTRAVENOUS
  Filled 2021-02-12: qty 20

## 2021-02-12 MED ORDER — SODIUM CHLORIDE 0.9 % IV SOLN
200.0000 mg | Freq: Once | INTRAVENOUS | Status: AC
  Administered 2021-02-12: 200 mg via INTRAVENOUS
  Filled 2021-02-12: qty 8

## 2021-02-12 MED ORDER — SODIUM CHLORIDE 0.9% FLUSH
10.0000 mL | INTRAVENOUS | Status: DC | PRN
  Administered 2021-02-12: 10 mL

## 2021-02-12 MED ORDER — CYANOCOBALAMIN 1000 MCG/ML IJ SOLN
1000.0000 ug | Freq: Once | INTRAMUSCULAR | Status: AC
  Administered 2021-02-12: 1000 ug via INTRAMUSCULAR
  Filled 2021-02-12: qty 1

## 2021-02-12 MED ORDER — PROCHLORPERAZINE MALEATE 10 MG PO TABS
10.0000 mg | ORAL_TABLET | Freq: Once | ORAL | Status: AC
  Administered 2021-02-12: 10 mg via ORAL
  Filled 2021-02-12: qty 1

## 2021-02-12 MED ORDER — SODIUM CHLORIDE 0.9% FLUSH
10.0000 mL | Freq: Once | INTRAVENOUS | Status: AC
  Administered 2021-02-12: 10 mL

## 2021-02-12 MED ORDER — SODIUM CHLORIDE 0.9 % IV SOLN: Freq: Once | INTRAVENOUS | Status: AC

## 2021-02-12 MED ORDER — HEPARIN SOD (PORK) LOCK FLUSH 100 UNIT/ML IV SOLN
500.0000 [IU] | Freq: Once | INTRAVENOUS | Status: AC | PRN
  Administered 2021-02-12: 500 [IU]

## 2021-02-12 NOTE — Patient Instructions (Signed)
[facility]  Discharge Instructions: Thank you for choosing [facility] to provide your oncology and hematology care.   If you have a lab appointment with the [facility], please go directly to the [facility] and check in at the registration area.   Wear comfortable clothing and clothing appropriate for easy access to any Portacath or PICC line.   We strive to give you quality time with your provider. You may need to reschedule your appointment if you arrive late (15 or more minutes).  Arriving late affects you and other patients whose appointments are after yours.  Also, if you miss three or more appointments without notifying the office, you may be dismissed from the clinic at the provider's discretion.      For prescription refill requests, have your pharmacy contact our office and allow 72 hours for refills to be completed.    Today you received the following chemotherapy and/or immunotherapy agents :  Pemetrexed & Pembrolizumab      To help prevent nausea and vomiting after your treatment, we encourage you to take your nausea medication as directed.  BELOW ARE SYMPTOMS THAT SHOULD BE REPORTED IMMEDIATELY: *FEVER GREATER THAN 100.4 F (38 C) OR HIGHER *CHILLS OR SWEATING *NAUSEA AND VOMITING THAT IS NOT CONTROLLED WITH YOUR NAUSEA MEDICATION *UNUSUAL SHORTNESS OF BREATH *UNUSUAL BRUISING OR BLEEDING *URINARY PROBLEMS (pain or burning when urinating, or frequent urination) *BOWEL PROBLEMS (unusual diarrhea, constipation, pain near the anus) TENDERNESS IN MOUTH AND THROAT WITH OR WITHOUT PRESENCE OF ULCERS (sore throat, sores in mouth, or a toothache) UNUSUAL RASH, SWELLING OR PAIN  UNUSUAL VAGINAL DISCHARGE OR ITCHING   Items with * indicate a potential emergency and should be followed up as soon as possible or go to the Emergency Department if any problems should occur.  Please show the CHEMOTHERAPY ALERT CARD or IMMUNOTHERAPY ALERT  CARD at check-in to the Emergency Department and triage nurse.  Should you have questions after your visit or need to cancel or reschedule your appointment, please contact [facility]  Dept: [phone]  and follow the prompts.  Office hours are 8:00 a.m. to 4:30 p.m. Monday - Friday. Please note that voicemails left after 4:00 p.m. may not be returned until the following business day.  We are closed weekends and major holidays. You have access to a nurse at all times for urgent questions. Please call the main number to the clinic Dept: [phone] and follow the prompts.   For any non-urgent questions, you may also contact your provider using MyChart. We now offer e-Visits for anyone 31 and older to request care online for non-urgent symptoms. For details visit [email or web address].   Also download the MyChart app! Go to the app store, search "MyChart", open the app, select [facility], and log in with your MyChart username and password.  Due to Covid, a mask is required upon entering the hospital/clinic. If you do not have a mask, one will be given to you upon arrival. For doctor visits, patients may have 1 support person aged 33 or older with them. For treatment visits, patients cannot have anyone with them due to current Covid guidelines and our immunocompromised population.

## 2021-02-22 ENCOUNTER — Other Ambulatory Visit: Payer: Self-pay | Admitting: Internal Medicine

## 2021-02-22 ENCOUNTER — Other Ambulatory Visit (INDEPENDENT_AMBULATORY_CARE_PROVIDER_SITE_OTHER): Payer: 59

## 2021-02-22 DIAGNOSIS — E7800 Pure hypercholesterolemia, unspecified: Secondary | ICD-10-CM

## 2021-02-22 DIAGNOSIS — R7302 Impaired glucose tolerance (oral): Secondary | ICD-10-CM | POA: Diagnosis not present

## 2021-02-22 DIAGNOSIS — Z0000 Encounter for general adult medical examination without abnormal findings: Secondary | ICD-10-CM | POA: Diagnosis not present

## 2021-02-22 DIAGNOSIS — E538 Deficiency of other specified B group vitamins: Secondary | ICD-10-CM

## 2021-02-22 DIAGNOSIS — E559 Vitamin D deficiency, unspecified: Secondary | ICD-10-CM | POA: Diagnosis not present

## 2021-02-22 LAB — CBC WITH DIFFERENTIAL/PLATELET
Basophils Absolute: 0 10*3/uL (ref 0.0–0.1)
Basophils Relative: 0.4 % (ref 0.0–3.0)
Eosinophils Absolute: 0.1 10*3/uL (ref 0.0–0.7)
Eosinophils Relative: 2.7 % (ref 0.0–5.0)
HCT: 36.4 % — ABNORMAL LOW (ref 39.0–52.0)
Hemoglobin: 11.7 g/dL — ABNORMAL LOW (ref 13.0–17.0)
Lymphocytes Relative: 49.5 % — ABNORMAL HIGH (ref 12.0–46.0)
Lymphs Abs: 2.1 10*3/uL (ref 0.7–4.0)
MCHC: 32.2 g/dL (ref 30.0–36.0)
MCV: 89.9 fl (ref 78.0–100.0)
Monocytes Absolute: 0.8 10*3/uL (ref 0.1–1.0)
Monocytes Relative: 18.6 % — ABNORMAL HIGH (ref 3.0–12.0)
Neutro Abs: 1.2 10*3/uL — ABNORMAL LOW (ref 1.4–7.7)
Neutrophils Relative %: 28.8 % — ABNORMAL LOW (ref 43.0–77.0)
Platelets: 136 10*3/uL — ABNORMAL LOW (ref 150.0–400.0)
RBC: 4.05 Mil/uL — ABNORMAL LOW (ref 4.22–5.81)
RDW: 14.5 % (ref 11.5–15.5)
WBC: 4.2 10*3/uL (ref 4.0–10.5)

## 2021-02-22 LAB — URINALYSIS, ROUTINE W REFLEX MICROSCOPIC
Bilirubin Urine: NEGATIVE
Hgb urine dipstick: NEGATIVE
Ketones, ur: NEGATIVE
Nitrite: NEGATIVE
RBC / HPF: NONE SEEN (ref 0–?)
Specific Gravity, Urine: 1.025 (ref 1.000–1.030)
Total Protein, Urine: NEGATIVE
Urine Glucose: NEGATIVE
Urobilinogen, UA: 0.2 (ref 0.0–1.0)
pH: 5.5 (ref 5.0–8.0)

## 2021-02-22 LAB — LIPID PANEL
Cholesterol: 187 mg/dL (ref 0–200)
HDL: 67.9 mg/dL (ref >39.00)
LDL Cholesterol: 108 mg/dL — ABNORMAL HIGH (ref 0–99)
NonHDL: 119.57
Total CHOL/HDL Ratio: 3
Triglycerides: 57 mg/dL (ref 0.0–149.0)
VLDL: 11.4 mg/dL (ref 0.0–40.0)

## 2021-02-22 LAB — HEPATIC FUNCTION PANEL
ALT: 65 U/L — ABNORMAL HIGH (ref 0–53)
AST: 48 U/L — ABNORMAL HIGH (ref 0–37)
Albumin: 4 g/dL (ref 3.5–5.2)
Alkaline Phosphatase: 50 U/L (ref 39–117)
Bilirubin, Direct: 0.1 mg/dL (ref 0.0–0.3)
Total Bilirubin: 0.5 mg/dL (ref 0.2–1.2)
Total Protein: 7.3 g/dL (ref 6.0–8.3)

## 2021-02-22 LAB — BASIC METABOLIC PANEL
BUN: 14 mg/dL (ref 6–23)
CO2: 29 mEq/L (ref 19–32)
Calcium: 9.5 mg/dL (ref 8.4–10.5)
Chloride: 103 mEq/L (ref 96–112)
Creatinine, Ser: 1.1 mg/dL (ref 0.40–1.50)
GFR: 71.2 mL/min (ref >60.00)
Glucose, Bld: 84 mg/dL (ref 70–99)
Potassium: 4.7 mEq/L (ref 3.5–5.1)
Sodium: 140 mEq/L (ref 135–145)

## 2021-02-22 LAB — PSA: PSA: 0.7 ng/mL (ref 0.10–4.00)

## 2021-02-22 LAB — VITAMIN D 25 HYDROXY (VIT D DEFICIENCY, FRACTURES): VITD: 40.93 ng/mL (ref 30.00–100.00)

## 2021-02-22 LAB — TSH: TSH: 1.34 u[IU]/mL (ref 0.35–5.50)

## 2021-02-22 LAB — HEMOGLOBIN A1C: Hgb A1c MFr Bld: 5.7 % (ref 4.6–6.5)

## 2021-02-22 LAB — VITAMIN B12: Vitamin B-12: 1278 pg/mL — ABNORMAL HIGH (ref 211–911)

## 2021-02-23 ENCOUNTER — Encounter: Payer: Self-pay | Admitting: Internal Medicine

## 2021-02-23 ENCOUNTER — Other Ambulatory Visit: Payer: Self-pay | Admitting: Internal Medicine

## 2021-02-23 MED ORDER — ROSUVASTATIN CALCIUM 20 MG PO TABS: 20.0000 mg | ORAL_TABLET | Freq: Every day | ORAL | 3 refills | Status: DC

## 2021-02-25 ENCOUNTER — Ambulatory Visit: Payer: 59 | Admitting: Internal Medicine

## 2021-02-26 ENCOUNTER — Other Ambulatory Visit: Payer: Self-pay

## 2021-02-26 ENCOUNTER — Encounter: Payer: Self-pay | Admitting: Internal Medicine

## 2021-02-26 ENCOUNTER — Ambulatory Visit: Payer: 59 | Admitting: Internal Medicine

## 2021-02-26 VITALS — BP 120/62 | HR 65 | Temp 98.3°F | Ht 72.0 in | Wt 164.0 lb

## 2021-02-26 DIAGNOSIS — Z23 Encounter for immunization: Secondary | ICD-10-CM

## 2021-02-26 DIAGNOSIS — E559 Vitamin D deficiency, unspecified: Secondary | ICD-10-CM

## 2021-02-26 DIAGNOSIS — E538 Deficiency of other specified B group vitamins: Secondary | ICD-10-CM

## 2021-02-26 DIAGNOSIS — R7302 Impaired glucose tolerance (oral): Secondary | ICD-10-CM

## 2021-02-26 DIAGNOSIS — E7800 Pure hypercholesterolemia, unspecified: Secondary | ICD-10-CM | POA: Diagnosis not present

## 2021-02-26 DIAGNOSIS — I10 Essential (primary) hypertension: Secondary | ICD-10-CM

## 2021-02-26 DIAGNOSIS — Z0001 Encounter for general adult medical examination with abnormal findings: Secondary | ICD-10-CM

## 2021-02-26 NOTE — Patient Instructions (Addendum)
Please consider the Novavax covid vaccine after oct 2022  We need to plan on the Prevnar pneumonia shot next visit, then Pneumovax after you turn 65yo.  You had the Shingrix shingles shot #1 today  Please return in 2 months for the Shingrix #2  - to see the NURSE only  Your goal for the LDL is less than 70 - please increase the pravastatin to 80 mg per day to use them up, then change to the rosuvastatin as you know about  Please continue all other medications as before, and refills have been done if requested.  Please have the pharmacy call with any other refills you may need.  Please continue your efforts at being more active, low cholesterol diet, and weight control.  You are otherwise up to date with prevention measures today.  Please keep your appointments with your specialists as you may have planned  Please make an Appointment to return in 6 months, or sooner if needed, also with Lab Appointment for testing done 3-5 days before at the [facility] (so this is for TWO appointments - please see the scheduling desk as you leave)  Due to the ongoing Covid 19 pandemic, our lab now requires an appointment for any labs done at our office.  If you need labs done and do not have an appointment, please call our office ahead of time to schedule before presenting to the lab for your testing.

## 2021-02-26 NOTE — Progress Notes (Signed)
Patient ID: Nicolas Allen, male   DOB: Oct 24, 1955, 65 y.o.   MRN: [id number]         Chief Complaint:: wellness exam and hld, htn, low vit d, hyperglycemia,        HPI:  Nicolas Allen is a 65 y.o. male here for wellness exam; decliens covid booster but ok for shingrix #1, o/w up to date with preventive referrals and immunizations.                         Also trying to follow lower chol diet.  Taking Vit D.  Pt denies chest pain, increased sob or doe, wheezing, orthopnea, PND, increased LE swelling, palpitations, dizziness or syncope.   Pt denies polydipsia, polyuria, or new focal neuro s/s.   Pt denies fever, wt loss, night sweats, loss of appetite, or other constitutional symptoms  No other new complaints  Wt Readings from Last 3 Encounters:  02/26/21 164 lb (74.4 kg)  02/12/21 166 lb 4.8 oz (75.4 kg)  01/22/21 163 lb 3.2 oz (74 kg)   BP Readings from Last 3 Encounters:  02/26/21 120/62  02/12/21 117/62  01/22/21 (!) 104/58   Immunization History  Administered Date(s) Administered   Influenza,inj,Quad PF,6+ Mos 06/30/2018, 02/24/2020, 02/26/2021   PFIZER(Purple Top)SARS-COV-2 Vaccination 09/02/2019, 09/27/2019, 04/09/2020   Td 12/13/2008   Tdap 02/23/2019   Zoster Recombinat (Shingrix) 02/26/2021   There are no preventive care reminders to display for this patient.     Past Medical History:  Diagnosis Date   Anemia 06/24/2012   ANXIETY 02/03/2007   Cancer ([place])    Cervical disc disease 02/12/2012   S/p surgury jan 2013   Erectile dysfunction 02/11/2011   GERD (gastroesophageal reflux disease)    GLUCOSE INTOLERANCE 01/04/2008   HYPERLIPIDEMIA 02/03/2007   HYPERTENSION 02/03/2007   IBS (irritable bowel syndrome)    Impaired glucose tolerance 02/11/2011   Smoker 08/22/2014   Substance abuse ([place]) 2001   sober for 16 yrs   Past Surgical History:  Procedure Laterality Date   BRONCHIAL BIOPSY  03/30/2020   Procedure: BRONCHIAL BIOPSIES;  Surgeon: Garner Nash, DO;   Location: [facility];  Service: Pulmonary;;   BRONCHIAL BRUSHINGS  03/30/2020   Procedure: BRONCHIAL BRUSHINGS;  Surgeon: Garner Nash, DO;  Location: [facility];  Service: Pulmonary;;   BRONCHIAL NEEDLE ASPIRATION BIOPSY  03/30/2020   Procedure: BRONCHIAL NEEDLE ASPIRATION BIOPSIES;  Surgeon: Garner Nash, DO;  Location: [facility];  Service: Pulmonary;;   BRONCHIAL WASHINGS  03/30/2020   Procedure: BRONCHIAL WASHINGS;  Surgeon: Garner Nash, DO;  Location: [facility];  Service: Pulmonary;;   COLONOSCOPY  2007   IR IMAGING GUIDED PORT INSERTION  04/23/2020   neck fusion  2012   C4   NO PAST SURGERIES     VIDEO BRONCHOSCOPY WITH ENDOBRONCHIAL NAVIGATION N/A 03/30/2020   Procedure: VIDEO BRONCHOSCOPY WITH ENDOBRONCHIAL NAVIGATION;  Surgeon: Garner Nash, DO;  Location: [facility];  Service: Pulmonary;  Laterality: N/A;   VIDEO BRONCHOSCOPY WITH ENDOBRONCHIAL ULTRASOUND N/A 03/30/2020   Procedure: VIDEO BRONCHOSCOPY WITH ENDOBRONCHIAL ULTRASOUND;  Surgeon: Garner Nash, DO;  Location: [facility];  Service: Pulmonary;  Laterality: N/A;    reports that he quit smoking about a year ago. His smoking use included cigarettes. He has a 45.00 pack-year smoking history. He has quit using smokeless tobacco.  His smokeless tobacco use included chew. He reports that he does not drink alcohol and does not  use drugs. family history includes Cancer in his cousin and mother; Hypertension in an other family member; Stroke in an other family member. No Known Allergies Current Outpatient Medications on File Prior to Visit  Medication Sig Dispense Refill   amLODipine-benazepril (LOTREL) 10-20 MG capsule TAKE 1 CAPSULE BY MOUTH ONCE DAILY 90 capsule 3   aspirin EC 81 MG tablet Take 81 mg by mouth daily. Swallow whole.     b complex vitamins tablet Take 1 tablet by mouth daily.     cholecalciferol (VITAMIN D3) 25 MCG (1000 UNIT) tablet Take 1,000 Units by mouth daily.     folic acid  (FOLVITE) 1 MG tablet Take 1 tablet (1 mg total) by mouth daily. 30 tablet 4   Garlic 7124 MG CAPS Take 1,000 mg by mouth daily.      ibuprofen (ADVIL) 200 MG tablet Take 200 mg by mouth every 6 (six) hours as needed.     lidocaine-prilocaine (EMLA) cream Apply to the Port-A-Cath site 30-60 minutes before chemotherapy. 30 g 0   Omega-3 Fatty Acids (FISH OIL PO) Take 1 capsule by mouth daily.      polyethylene glycol (MIRALAX / GLYCOLAX) 17 g packet Take 17 g by mouth daily as needed.     prochlorperazine (COMPAZINE) 10 MG tablet Take 1 tablet (10 mg total) by mouth every 6 (six) hours as needed for nausea or vomiting. 30 tablet 0   rosuvastatin (CRESTOR) 20 MG tablet Take 1 tablet (20 mg total) by mouth daily. 90 tablet 3   vardenafil (LEVITRA) 20 MG tablet TAKE 1 TABLET BY MOUTH AS NEEDED FOR  ERECTILE  DYSFUNCTION 10 tablet 5   No current facility-administered medications on file prior to visit.        ROS:  All others reviewed and negative.  Objective        PE:  BP 120/62 (BP Location: Right Arm, Patient Position: Sitting, Cuff Size: Normal)   Pulse 65   Temp 98.3 F (36.8 C) (Oral)   Ht 6' (1.829 m)   Wt 164 lb (74.4 kg)   SpO2 100%   BMI 22.24 kg/m                 Constitutional: Pt appears in NAD               HENT: Head: NCAT.                Right Ear: External ear normal.                 Left Ear: External ear normal.                Eyes: . Pupils are equal, round, and reactive to light. Conjunctivae and EOM are normal               Nose: without d/c or deformity               Neck: Neck supple. Gross normal ROM               Cardiovascular: Normal rate and regular rhythm.                 Pulmonary/Chest: Effort normal and breath sounds without rales or wheezing.                Abd:  Soft, NT, ND, + BS, no organomegaly               Neurological: Pt  is alert. At baseline orientation, motor grossly intact               Skin: Skin is warm. No rashes, no other new lesions,  LE edema - none               Psychiatric: Pt behavior is normal without agitation   Micro: none  Cardiac tracings I have personally interpreted today:  none  Pertinent Radiological findings (summarize): none   Lab Results  Component Value Date   WBC 4.2 02/22/2021   HGB 11.7 (L) 02/22/2021   HCT 36.4 (L) 02/22/2021   PLT 136.0 (L) 02/22/2021   GLUCOSE 84 02/22/2021   CHOL 187 02/22/2021   TRIG 57.0 02/22/2021   HDL 67.90 02/22/2021   LDLCALC 108 (H) 02/22/2021   ALT 65 (H) 02/22/2021   AST 48 (H) 02/22/2021   NA 140 02/22/2021   K 4.7 02/22/2021   CL 103 02/22/2021   CREATININE 1.10 02/22/2021   BUN 14 02/22/2021   CO2 29 02/22/2021   TSH 1.34 02/22/2021   PSA 0.70 02/22/2021   INR 1.0 03/30/2020   HGBA1C 5.7 02/22/2021   Assessment/Plan:  Nicolas Allen is a 65 y.o. Black or African American [2] male with  has a past medical history of Anemia (06/24/2012), ANXIETY (02/03/2007), Cancer ([place]), Cervical disc disease (02/12/2012), Erectile dysfunction (02/11/2011), GERD (gastroesophageal reflux disease), GLUCOSE INTOLERANCE (01/04/2008), HYPERLIPIDEMIA (02/03/2007), HYPERTENSION (02/03/2007), IBS (irritable bowel syndrome), Impaired glucose tolerance (02/11/2011), Smoker (08/22/2014), and Substance abuse ([place]) (2001).  Encounter for well adult exam with abnormal findings Age and sex appropriate education and counseling updated with regular exercise and diet Referrals for preventative services - none needed Immunizations addressed - declines covid booster, ok for shingrix #1 Smoking counseling  - none needed Evidence for depression or other mood disorder - none significant Most recent labs reviewed. I have personally reviewed and have noted: 1) the patient's medical and social history 2) The patient's current medications and supplements 3) The patient's height, weight, and BMI have been recorded in the chart   Vitamin D deficiency Last vitamin D Lab Results  Component Value  Date   VD25OH 40.93 02/22/2021   Stable, cont oral replacement  Impaired glucose tolerance Lab Results  Component Value Date   HGBA1C 5.7 02/22/2021   Stable, pt to continue current medical treatment  - diet   Hyperlipidemia Lab Results  Component Value Date   LDLCALC 108 (H) 02/22/2021   Uncontrolled, cont low chol diet, pt to increase current statin to crestor 20  Essential hypertension BP Readings from Last 3 Encounters:  02/26/21 120/62  02/12/21 117/62  01/22/21 (!) 104/58   Stable, pt to continue medical treatment lotrel  Followup: Return in about 6 months (around 08/26/2021).  Cathlean Cower, MD 03/03/2021 7:08 PM [facility]

## 2021-02-28 ENCOUNTER — Other Ambulatory Visit: Payer: Self-pay

## 2021-02-28 ENCOUNTER — Ambulatory Visit (HOSPITAL_COMMUNITY)
Admission: RE | Admit: 2021-02-28 | Discharge: 2021-02-28 | Disposition: A | Discharge status: Home / Self Care | Payer: 59 | Source: Ambulatory Visit | Attending: Physician Assistant | Admitting: Physician Assistant

## 2021-02-28 DIAGNOSIS — C3491 Malignant neoplasm of unspecified part of right bronchus or lung: Secondary | ICD-10-CM | POA: Insufficient documentation

## 2021-02-28 MED ORDER — HEPARIN SOD (PORK) LOCK FLUSH 100 UNIT/ML IV SOLN
INTRAVENOUS | Status: AC
  Filled 2021-02-28: qty 5

## 2021-02-28 MED ORDER — IOHEXOL 350 MG/ML SOLN
80.0000 mL | Freq: Once | INTRAVENOUS | Status: AC | PRN
  Administered 2021-02-28: 80 mL via INTRAVENOUS

## 2021-02-28 MED ORDER — HEPARIN SOD (PORK) LOCK FLUSH 100 UNIT/ML IV SOLN
500.0000 [IU] | Freq: Once | INTRAVENOUS | Status: AC
  Administered 2021-02-28: 500 [IU] via INTRAVENOUS

## 2021-03-03 ENCOUNTER — Encounter: Payer: Self-pay | Admitting: Internal Medicine

## 2021-03-03 NOTE — Assessment & Plan Note (Signed)
Last vitamin D Lab Results  Component Value Date   VD25OH 40.93 02/22/2021   Stable, cont oral replacement

## 2021-03-03 NOTE — Assessment & Plan Note (Signed)
Lab Results  Component Value Date   HGBA1C 5.7 02/22/2021   Stable, pt to continue current medical treatment  - diet

## 2021-03-03 NOTE — Assessment & Plan Note (Signed)
Age and sex appropriate education and counseling updated with regular exercise and diet Referrals for preventative services - none needed Immunizations addressed - declines covid booster, ok for shingrix #1 Smoking counseling  - none needed Evidence for depression or other mood disorder - none significant Most recent labs reviewed. I have personally reviewed and have noted: 1) the patient's medical and social history 2) The patient's current medications and supplements 3) The patient's height, weight, and BMI have been recorded in the chart

## 2021-03-03 NOTE — Assessment & Plan Note (Signed)
Lab Results  Component Value Date   LDLCALC 108 (H) 02/22/2021   Uncontrolled, cont low chol diet, pt to increase current statin to crestor 20

## 2021-03-03 NOTE — Assessment & Plan Note (Signed)
BP Readings from Last 3 Encounters:  02/26/21 120/62  02/12/21 117/62  01/22/21 (!) 104/58   Stable, pt to continue medical treatment lotrel

## 2021-03-05 ENCOUNTER — Inpatient Hospital Stay (HOSPITAL_BASED_OUTPATIENT_CLINIC_OR_DEPARTMENT_OTHER): Payer: 59 | Admitting: Internal Medicine

## 2021-03-05 ENCOUNTER — Encounter: Payer: Self-pay | Admitting: Internal Medicine

## 2021-03-05 ENCOUNTER — Inpatient Hospital Stay: Payer: 59 | Attending: Internal Medicine

## 2021-03-05 ENCOUNTER — Other Ambulatory Visit: Payer: Self-pay

## 2021-03-05 ENCOUNTER — Inpatient Hospital Stay: Payer: 59

## 2021-03-05 VITALS — BP 124/66 | HR 76 | Temp 96.8°F | Resp 18 | Wt 165.7 lb

## 2021-03-05 DIAGNOSIS — C3431 Malignant neoplasm of lower lobe, right bronchus or lung: Secondary | ICD-10-CM | POA: Diagnosis not present

## 2021-03-05 DIAGNOSIS — Z5111 Encounter for antineoplastic chemotherapy: Secondary | ICD-10-CM | POA: Insufficient documentation

## 2021-03-05 DIAGNOSIS — C3491 Malignant neoplasm of unspecified part of right bronchus or lung: Secondary | ICD-10-CM | POA: Diagnosis not present

## 2021-03-05 DIAGNOSIS — Z95828 Presence of other vascular implants and grafts: Secondary | ICD-10-CM

## 2021-03-05 DIAGNOSIS — C7931 Secondary malignant neoplasm of brain: Secondary | ICD-10-CM

## 2021-03-05 DIAGNOSIS — C3411 Malignant neoplasm of upper lobe, right bronchus or lung: Secondary | ICD-10-CM | POA: Diagnosis present

## 2021-03-05 DIAGNOSIS — Z5112 Encounter for antineoplastic immunotherapy: Secondary | ICD-10-CM | POA: Insufficient documentation

## 2021-03-05 DIAGNOSIS — C3432 Malignant neoplasm of lower lobe, left bronchus or lung: Secondary | ICD-10-CM | POA: Insufficient documentation

## 2021-03-05 DIAGNOSIS — Z79899 Other long term (current) drug therapy: Secondary | ICD-10-CM | POA: Diagnosis not present

## 2021-03-05 LAB — CBC WITH DIFFERENTIAL (CANCER CENTER ONLY)
Abs Immature Granulocytes: 0.01 10*3/uL (ref 0.00–0.07)
Basophils Absolute: 0 10*3/uL (ref 0.0–0.1)
Basophils Relative: 1 %
Eosinophils Absolute: 0.1 10*3/uL (ref 0.0–0.5)
Eosinophils Relative: 3 %
HCT: 30.7 % — ABNORMAL LOW (ref 39.0–52.0)
Hemoglobin: 10.6 g/dL — ABNORMAL LOW (ref 13.0–17.0)
Immature Granulocytes: 0 %
Lymphocytes Relative: 42 %
Lymphs Abs: 1.9 10*3/uL (ref 0.7–4.0)
MCH: 29.3 pg (ref 26.0–34.0)
MCHC: 34.5 g/dL (ref 30.0–36.0)
MCV: 84.8 fL (ref 80.0–100.0)
Monocytes Absolute: 0.5 10*3/uL (ref 0.1–1.0)
Monocytes Relative: 12 %
Neutro Abs: 1.8 10*3/uL (ref 1.7–7.7)
Neutrophils Relative %: 42 %
Platelet Count: 176 10*3/uL (ref 150–400)
RBC: 3.62 MIL/uL — ABNORMAL LOW (ref 4.22–5.81)
RDW: 14.6 % (ref 11.5–15.5)
WBC Count: 4.4 10*3/uL (ref 4.0–10.5)
nRBC: 0 % (ref 0.0–0.2)

## 2021-03-05 LAB — CMP (CANCER CENTER ONLY)
ALT: 30 U/L (ref 0–44)
AST: 30 U/L (ref 15–41)
Albumin: 3.3 g/dL — ABNORMAL LOW (ref 3.5–5.0)
Alkaline Phosphatase: 52 U/L (ref 38–126)
Anion gap: 8 (ref 5–15)
BUN: 11 mg/dL (ref 8–23)
CO2: 24 mmol/L (ref 22–32)
Calcium: 9 mg/dL (ref 8.9–10.3)
Chloride: 106 mmol/L (ref 98–111)
Creatinine: 1.25 mg/dL — ABNORMAL HIGH (ref 0.61–1.24)
GFR, Estimated: >60 mL/min (ref >60)
Glucose, Bld: 124 mg/dL — ABNORMAL HIGH (ref 70–99)
Potassium: 3.7 mmol/L (ref 3.5–5.1)
Sodium: 138 mmol/L (ref 135–145)
Total Bilirubin: 0.4 mg/dL (ref 0.3–1.2)
Total Protein: 7 g/dL (ref 6.5–8.1)

## 2021-03-05 LAB — TSH: TSH: 1.086 u[IU]/mL (ref 0.320–4.118)

## 2021-03-05 MED ORDER — SODIUM CHLORIDE 0.9 % IV SOLN
500.0000 mg/m2 | Freq: Once | INTRAVENOUS | Status: AC
  Administered 2021-03-05: 900 mg via INTRAVENOUS
  Filled 2021-03-05: qty 20

## 2021-03-05 MED ORDER — SODIUM CHLORIDE 0.9 % IV SOLN
200.0000 mg | Freq: Once | INTRAVENOUS | Status: AC
  Administered 2021-03-05: 200 mg via INTRAVENOUS
  Filled 2021-03-05: qty 8

## 2021-03-05 MED ORDER — SODIUM CHLORIDE 0.9% FLUSH
10.0000 mL | INTRAVENOUS | Status: DC | PRN
  Administered 2021-03-05: 10 mL

## 2021-03-05 MED ORDER — SODIUM CHLORIDE 0.9% FLUSH
10.0000 mL | Freq: Once | INTRAVENOUS | Status: AC
  Administered 2021-03-05: 10 mL

## 2021-03-05 MED ORDER — PROCHLORPERAZINE MALEATE 10 MG PO TABS
10.0000 mg | ORAL_TABLET | Freq: Once | ORAL | Status: AC
  Administered 2021-03-05: 10 mg via ORAL
  Filled 2021-03-05: qty 1

## 2021-03-05 MED ORDER — HEPARIN SOD (PORK) LOCK FLUSH 100 UNIT/ML IV SOLN
500.0000 [IU] | Freq: Once | INTRAVENOUS | Status: AC | PRN
  Administered 2021-03-05: 500 [IU]

## 2021-03-05 MED ORDER — SODIUM CHLORIDE 0.9 % IV SOLN: Freq: Once | INTRAVENOUS | Status: AC

## 2021-03-05 NOTE — Progress Notes (Signed)
[place] [phone]   [phone]  OFFICE PROGRESS NOTE  [name], MD [place] [address]  DIAGNOSIS: Stage IV (T3, N2, M1c) non-small cell lung cancer favoring adenocarcinoma presented with right upper lobe lung mass in addition to right lower lobe, left lower lobe in addition to right hilar and mediastinal lymphadenopathy in addition to metastatic brain lesions diagnosed in October 2021.  Molecular studies by [employer]:  [id number], 9.5%,   PRIOR THERAPY: SRS to 3 brain lesion under the care of Dr. Lisbeth Renshaw.  Scheduled for April 20, 2020  CURRENT THERAPY: Systemic chemotherapy with carboplatin for AUC of 5, Alimta 500 mg/M2 and Keytruda 200 mg IV every 3 weeks.  First dose April 24, 2020.  Status post 15 cycles.  Starting from cycle #5 the patient will be on maintenance treatment with Alimta and Keytruda every 3 weeks.  INTERVAL HISTORY: GRANVIL DJORDJEVIC 65 y.o. male returns to the clinic today for follow-up visit.  The patient is feeling fine today with no concerning complaints.  He denied having any current chest pain, shortness of breath, cough or hemoptysis.  He denied having any fever or chills.  He has no nausea, vomiting, diarrhea or constipation.  He has no headache or visual changes.  He has no weight loss or night sweats.  He continues to tolerate his maintenance treatment with Alimta and Keytruda fairly well.  The patient had repeat CT scan of the chest, abdomen pelvis performed recently and he is here for evaluation and discussion of his discuss results.  MEDICAL HISTORY: Past Medical History:  Diagnosis Date   Anemia 06/24/2012   ANXIETY 02/03/2007   Cancer ([place])    Cervical disc disease 02/12/2012   S/p surgury jan 2013   Erectile dysfunction 02/11/2011   GERD (gastroesophageal reflux disease)    GLUCOSE INTOLERANCE 01/04/2008   HYPERLIPIDEMIA 02/03/2007   HYPERTENSION 02/03/2007   IBS (irritable bowel  syndrome)    Impaired glucose tolerance 02/11/2011   Smoker 08/22/2014   Substance abuse ([place]) 2001   sober for 16 yrs    ALLERGIES:  has No Known Allergies.  MEDICATIONS:  Current Outpatient Medications  Medication Sig Dispense Refill   amLODipine-benazepril (LOTREL) 10-20 MG capsule TAKE 1 CAPSULE BY MOUTH ONCE DAILY 90 capsule 3   aspirin EC 81 MG tablet Take 81 mg by mouth daily. Swallow whole.     b complex vitamins tablet Take 1 tablet by mouth daily.     cholecalciferol (VITAMIN D3) 25 MCG (1000 UNIT) tablet Take 1,000 Units by mouth daily.     folic acid (FOLVITE) 1 MG tablet Take 1 tablet (1 mg total) by mouth daily. 30 tablet 4   Garlic 4765 MG CAPS Take 1,000 mg by mouth daily.      ibuprofen (ADVIL) 200 MG tablet Take 200 mg by mouth every 6 (six) hours as needed.     lidocaine-prilocaine (EMLA) cream Apply to the Port-A-Cath site 30-60 minutes before chemotherapy. 30 g 0   Omega-3 Fatty Acids (FISH OIL PO) Take 1 capsule by mouth daily.      polyethylene glycol (MIRALAX / GLYCOLAX) 17 g packet Take 17 g by mouth daily as needed.     prochlorperazine (COMPAZINE) 10 MG tablet Take 1 tablet (10 mg total) by mouth every 6 (six) hours as needed for nausea or vomiting. 30 tablet 0   rosuvastatin (CRESTOR) 20 MG tablet Take 1 tablet (20 mg  total) by mouth daily. 90 tablet 3   vardenafil (LEVITRA) 20 MG tablet TAKE 1 TABLET BY MOUTH AS NEEDED FOR  ERECTILE  DYSFUNCTION 10 tablet 5   No current facility-administered medications for this visit.    SURGICAL HISTORY:  Past Surgical History:  Procedure Laterality Date   BRONCHIAL BIOPSY  03/30/2020   Procedure: BRONCHIAL BIOPSIES;  Surgeon: Garner Nash, DO;  Location: [facility];  Service: Pulmonary;;   BRONCHIAL BRUSHINGS  03/30/2020   Procedure: BRONCHIAL BRUSHINGS;  Surgeon: Garner Nash, DO;  Location: [facility];  Service: Pulmonary;;   BRONCHIAL NEEDLE ASPIRATION BIOPSY  03/30/2020   Procedure: BRONCHIAL NEEDLE  ASPIRATION BIOPSIES;  Surgeon: Garner Nash, DO;  Location: [facility];  Service: Pulmonary;;   BRONCHIAL WASHINGS  03/30/2020   Procedure: BRONCHIAL WASHINGS;  Surgeon: Garner Nash, DO;  Location: [facility];  Service: Pulmonary;;   COLONOSCOPY  2007   IR IMAGING GUIDED PORT INSERTION  04/23/2020   neck fusion  2012   C4   NO PAST SURGERIES     VIDEO BRONCHOSCOPY WITH ENDOBRONCHIAL NAVIGATION N/A 03/30/2020   Procedure: VIDEO BRONCHOSCOPY WITH ENDOBRONCHIAL NAVIGATION;  Surgeon: Garner Nash, DO;  Location: [facility];  Service: Pulmonary;  Laterality: N/A;   VIDEO BRONCHOSCOPY WITH ENDOBRONCHIAL ULTRASOUND N/A 03/30/2020   Procedure: VIDEO BRONCHOSCOPY WITH ENDOBRONCHIAL ULTRASOUND;  Surgeon: Garner Nash, DO;  Location: [facility];  Service: Pulmonary;  Laterality: N/A;    REVIEW OF SYSTEMS:  Constitutional: negative Eyes: negative Ears, nose, mouth, throat, and face: negative Respiratory: negative Cardiovascular: negative Gastrointestinal: negative Genitourinary:negative Integument/breast: negative Hematologic/lymphatic: negative Musculoskeletal:negative Neurological: negative Behavioral/Psych: negative Endocrine: negative Allergic/Immunologic: negative   PHYSICAL EXAMINATION: General appearance: alert, cooperative, and no distress Head: Normocephalic, without obvious abnormality, atraumatic Neck: no adenopathy, no JVD, supple, symmetrical, trachea midline, and thyroid not enlarged, symmetric, no tenderness/mass/nodules Lymph nodes: Cervical, supraclavicular, and axillary nodes normal. Resp: clear to auscultation bilaterally Back: symmetric, no curvature. ROM normal. No CVA tenderness. Cardio: regular rate and rhythm, S1, S2 normal, no murmur, click, rub or gallop GI: soft, non-tender; bowel sounds normal; no masses,  no organomegaly Extremities: extremities normal, atraumatic, no cyanosis or edema Neurologic: Alert and oriented X 3, normal strength  and tone. Normal symmetric reflexes. Normal coordination and gait  ECOG PERFORMANCE STATUS: 0 - Asymptomatic  Blood pressure 124/66, pulse 76, temperature (!) 96.8 F (36 C), temperature source Tympanic, resp. rate 18, weight 165 lb 11.2 oz (75.2 kg), SpO2 100 %.  LABORATORY DATA: Lab Results  Component Value Date   WBC 4.4 03/05/2021   HGB 10.6 (L) 03/05/2021   HCT 30.7 (L) 03/05/2021   MCV 84.8 03/05/2021   PLT 176 03/05/2021      Chemistry      Component Value Date/Time   NA 140 02/22/2021 1256   K 4.7 02/22/2021 1256   CL 103 02/22/2021 1256   CO2 29 02/22/2021 1256   BUN 14 02/22/2021 1256   CREATININE 1.10 02/22/2021 1256   CREATININE 1.19 02/12/2021 0908   CREATININE 0.88 02/21/2020 1115      Component Value Date/Time   CALCIUM 9.5 02/22/2021 1256   ALKPHOS 50 02/22/2021 1256   AST 48 (H) 02/22/2021 1256   AST 45 (H) 02/12/2021 0908   ALT 65 (H) 02/22/2021 1256   ALT 58 (H) 02/12/2021 0908   BILITOT 0.5 02/22/2021 1256   BILITOT 0.4 02/12/2021 0908       RADIOGRAPHIC STUDIES: CT Chest W Contrast  Result Date:  03/01/2021 CLINICAL DATA:  Non-small cell lung cancer. Assess treatment response. Chemotherapy and immunotherapy ongoing. EXAM: CT CHEST, ABDOMEN, AND PELVIS WITH CONTRAST TECHNIQUE: Multidetector CT imaging of the chest, abdomen and pelvis was performed following the standard protocol during bolus administration of intravenous contrast. CONTRAST:  84mL OMNIPAQUE IOHEXOL 350 MG/ML SOLN COMPARISON:  CT 12/28/2020.  PET-CT 04/02/2020. FINDINGS: CT CHEST FINDINGS Cardiovascular: Right IJ Port-A-Cath extends to the superior cavoatrial junction. No acute vascular findings are identified. Mild aortic and great vessel atherosclerosis. The heart size is normal. There is no pericardial effusion. Mediastinum/Nodes: There are no enlarged mediastinal, hilar or axillary lymph nodes. Small mediastinal and axillary lymph nodes are stable the thyroid gland, trachea and  esophagus demonstrate no significant findings. Lungs/Pleura: No pleural effusion or pneumothorax. Again demonstrated are multiple ground-glass and part solid right lung nodules. The treated lesion inferiorly in the right upper lobe measures 3.8 x 1.0 cm on image 64/4, similar to the most recent study (4.1 x 1.0 cm). Part solid right upper lobe nodule measures 1.2 x 0.8 cm on image 27/4, not significantly changed. Part solid right lower nodule posterior to the bronchus intermedius measures 1.9 x 1.0 cm on image 66/4, not significantly changed. Part solid right lower lobe nodule measures 2.6 x 2.5 cm on image 83/4, not significantly changed. 1 cm part solid nodule in the right middle lobe on image 74/4 is also stable. There is one new nodule in the right upper lobe, measuring 0.7 cm on image 25/4. The dominant cavitary and solid left lower lobe nodule has not significantly changed in overall size, measuring up to 3.0 x 2.9 cm on image 110/4. Posterior solid components appear slightly smaller, measuring 1.7 x 0.9 cm on image 117/4, previously up to 1.9 x 1.2 cm. No other new or enlarging nodules identified. Musculoskeletal/Chest wall: No chest wall mass or suspicious osseous findings. CT ABDOMEN AND PELVIS FINDINGS Hepatobiliary: The liver is normal in density without suspicious focal abnormality. No evidence of gallstones, gallbladder wall thickening or biliary dilatation. Pancreas: The pancreas appears stable without mass lesion, ductal dilatation or surrounding inflammation. Possible pancreas divisum. Spleen: Normal in size without focal abnormality. Adrenals/Urinary Tract: Both adrenal glands appear normal. The kidneys appear normal without evidence of urinary tract calculus, suspicious lesion or hydronephrosis. No bladder abnormalities are seen. Stomach/Bowel: Enteric contrast was administered and has passed into the distal transverse colon. The stomach appears unremarkable for its degree of distension. No  evidence of bowel wall thickening, distention or surrounding inflammatory change. Appendix appears normal. Vascular/Lymphatic: There are no enlarged abdominal or pelvic lymph nodes. Moderate aortic and branch vessel atherosclerosis without aneurysm or acute vascular findings. The portal, superior mesenteric and splenic veins are patent. Reproductive: The prostate gland and seminal vesicles appear unremarkable. Other: No evidence of abdominal wall mass or hernia. No ascites. Musculoskeletal: No acute or significant osseous findings. Stable subcutaneous nodules within the left lower chest and upper abdominal anterior wall, likely sebaceous cysts. IMPRESSION: 1. The treated nodule inferiorly in the right upper lobe has not significantly changed in size. 2. There is one new part solid right upper lobe nodule measuring 0.7 cm which may reflect a metastasis. Attention on follow-up recommended. 3. The multiple additional part solid right lung nodules have not significantly changed. The dominant cavitary part solid left lower lobe nodule is similar in overall size, although the solid components have slightly improved. 4. No evidence of metastatic disease in the abdomen or pelvis. No thoracic adenopathy. 5.  Aortic Atherosclerosis (ICD10-I70.0).  Electronically Signed   By: Richardean Sale M.D.   On: 03/01/2021 10:44   CT Abdomen Pelvis W Contrast  Result Date: 03/01/2021 CLINICAL DATA:  Non-small cell lung cancer. Assess treatment response. Chemotherapy and immunotherapy ongoing. EXAM: CT CHEST, ABDOMEN, AND PELVIS WITH CONTRAST TECHNIQUE: Multidetector CT imaging of the chest, abdomen and pelvis was performed following the standard protocol during bolus administration of intravenous contrast. CONTRAST:  23mL OMNIPAQUE IOHEXOL 350 MG/ML SOLN COMPARISON:  CT 12/28/2020.  PET-CT 04/02/2020. FINDINGS: CT CHEST FINDINGS Cardiovascular: Right IJ Port-A-Cath extends to the superior cavoatrial junction. No acute vascular  findings are identified. Mild aortic and great vessel atherosclerosis. The heart size is normal. There is no pericardial effusion. Mediastinum/Nodes: There are no enlarged mediastinal, hilar or axillary lymph nodes. Small mediastinal and axillary lymph nodes are stable the thyroid gland, trachea and esophagus demonstrate no significant findings. Lungs/Pleura: No pleural effusion or pneumothorax. Again demonstrated are multiple ground-glass and part solid right lung nodules. The treated lesion inferiorly in the right upper lobe measures 3.8 x 1.0 cm on image 64/4, similar to the most recent study (4.1 x 1.0 cm). Part solid right upper lobe nodule measures 1.2 x 0.8 cm on image 27/4, not significantly changed. Part solid right lower nodule posterior to the bronchus intermedius measures 1.9 x 1.0 cm on image 66/4, not significantly changed. Part solid right lower lobe nodule measures 2.6 x 2.5 cm on image 83/4, not significantly changed. 1 cm part solid nodule in the right middle lobe on image 74/4 is also stable. There is one new nodule in the right upper lobe, measuring 0.7 cm on image 25/4. The dominant cavitary and solid left lower lobe nodule has not significantly changed in overall size, measuring up to 3.0 x 2.9 cm on image 110/4. Posterior solid components appear slightly smaller, measuring 1.7 x 0.9 cm on image 117/4, previously up to 1.9 x 1.2 cm. No other new or enlarging nodules identified. Musculoskeletal/Chest wall: No chest wall mass or suspicious osseous findings. CT ABDOMEN AND PELVIS FINDINGS Hepatobiliary: The liver is normal in density without suspicious focal abnormality. No evidence of gallstones, gallbladder wall thickening or biliary dilatation. Pancreas: The pancreas appears stable without mass lesion, ductal dilatation or surrounding inflammation. Possible pancreas divisum. Spleen: Normal in size without focal abnormality. Adrenals/Urinary Tract: Both adrenal glands appear normal. The kidneys  appear normal without evidence of urinary tract calculus, suspicious lesion or hydronephrosis. No bladder abnormalities are seen. Stomach/Bowel: Enteric contrast was administered and has passed into the distal transverse colon. The stomach appears unremarkable for its degree of distension. No evidence of bowel wall thickening, distention or surrounding inflammatory change. Appendix appears normal. Vascular/Lymphatic: There are no enlarged abdominal or pelvic lymph nodes. Moderate aortic and branch vessel atherosclerosis without aneurysm or acute vascular findings. The portal, superior mesenteric and splenic veins are patent. Reproductive: The prostate gland and seminal vesicles appear unremarkable. Other: No evidence of abdominal wall mass or hernia. No ascites. Musculoskeletal: No acute or significant osseous findings. Stable subcutaneous nodules within the left lower chest and upper abdominal anterior wall, likely sebaceous cysts. IMPRESSION: 1. The treated nodule inferiorly in the right upper lobe has not significantly changed in size. 2. There is one new part solid right upper lobe nodule measuring 0.7 cm which may reflect a metastasis. Attention on follow-up recommended. 3. The multiple additional part solid right lung nodules have not significantly changed. The dominant cavitary part solid left lower lobe nodule is similar in overall size, although the  solid components have slightly improved. 4. No evidence of metastatic disease in the abdomen or pelvis. No thoracic adenopathy. 5.  Aortic Atherosclerosis (ICD10-I70.0). Electronically Signed   By: Richardean Sale M.D.   On: 03/01/2021 10:44     ASSESSMENT AND PLAN: This is a very pleasant 65 years old African-American male recently diagnosed with a stage IV (T3, N2, M1c)  non-small cell lung cancer, adenocarcinoma presented with right upper lobe lung mass in addition to right lower lobe, left lower lobe in addition to right hilar and mediastinal  lymphadenopathy in addition to metastatic brain lesions diagnosed in October 2021. The patient has molecular studies by [employer] that showed no actionable mutations. He underwent SRS treatment to his brain lesion. The patient is currently undergoing systemic chemotherapy with carboplatin for AUC of 5, Alimta 500 mg/M2 and Keytruda 200 mg IV every 3 weeks status post 15 cycles.  Starting from cycle #5 the patient will be on maintenance treatment with Alimta and Keytruda every 3 weeks. The patient has been tolerating this treatment well with no concerning adverse effects. He had repeat CT scan of the chest, abdomen pelvis performed recently.  I personally and independently reviewed the scans and discussed the results with the patient today and showed him the images. His scan showed no concerning findings for disease progression except for new partially solid right upper lobe lung nodule measuring 0.7 cm which could reflect a metastasis but also inflammatory process could not be excluded. I recommended for the patient to continue his current treatment with maintenance Alimta and Keytruda at this point. Will continue to monitor the new nodule closely on the upcoming imaging studies. The patient will come back for follow-up visit in 3 weeks for evaluation before the next cycle of his treatment. He was advised to call immediately if he has any other concerning symptoms in the interval. The patient voices understanding of current disease status and treatment options and is in agreement with the current care plan.  All questions were answered. The patient knows to call the clinic with any problems, questions or concerns. We can certainly see the patient much sooner if necessary.  Disclaimer: This note was dictated with voice recognition software. Similar sounding words can inadvertently be transcribed and may not be corrected upon review.

## 2021-03-05 NOTE — Patient Instructions (Signed)
[facility]  Discharge Instructions: Thank you for choosing [facility] to provide your oncology and hematology care.   If you have a lab appointment with the [facility], please go directly to the [facility] and check in at the registration area.   Wear comfortable clothing and clothing appropriate for easy access to any Portacath or PICC line.   We strive to give you quality time with your provider. You may need to reschedule your appointment if you arrive late (15 or more minutes).  Arriving late affects you and other patients whose appointments are after yours.  Also, if you miss three or more appointments without notifying the office, you may be dismissed from the clinic at the provider's discretion.      For prescription refill requests, have your pharmacy contact our office and allow 72 hours for refills to be completed.    Today you received the following chemotherapy and/or immunotherapy agents :  Pemetrexed & Pembrolizumab      To help prevent nausea and vomiting after your treatment, we encourage you to take your nausea medication as directed.  BELOW ARE SYMPTOMS THAT SHOULD BE REPORTED IMMEDIATELY: *FEVER GREATER THAN 100.4 F (38 C) OR HIGHER *CHILLS OR SWEATING *NAUSEA AND VOMITING THAT IS NOT CONTROLLED WITH YOUR NAUSEA MEDICATION *UNUSUAL SHORTNESS OF BREATH *UNUSUAL BRUISING OR BLEEDING *URINARY PROBLEMS (pain or burning when urinating, or frequent urination) *BOWEL PROBLEMS (unusual diarrhea, constipation, pain near the anus) TENDERNESS IN MOUTH AND THROAT WITH OR WITHOUT PRESENCE OF ULCERS (sore throat, sores in mouth, or a toothache) UNUSUAL RASH, SWELLING OR PAIN  UNUSUAL VAGINAL DISCHARGE OR ITCHING   Items with * indicate a potential emergency and should be followed up as soon as possible or go to the Emergency Department if any problems should occur.  Please show the CHEMOTHERAPY ALERT CARD or IMMUNOTHERAPY ALERT  CARD at check-in to the Emergency Department and triage nurse.  Should you have questions after your visit or need to cancel or reschedule your appointment, please contact [facility]  Dept: [phone]  and follow the prompts.  Office hours are 8:00 a.m. to 4:30 p.m. Monday - Friday. Please note that voicemails left after 4:00 p.m. may not be returned until the following business day.  We are closed weekends and major holidays. You have access to a nurse at all times for urgent questions. Please call the main number to the clinic Dept: [phone] and follow the prompts.   For any non-urgent questions, you may also contact your provider using MyChart. We now offer e-Visits for anyone 60 and older to request care online for non-urgent symptoms. For details visit [email or web address].   Also download the MyChart app! Go to the app store, search "MyChart", open the app, select [facility], and log in with your MyChart username and password.  Due to Covid, a mask is required upon entering the hospital/clinic. If you do not have a mask, one will be given to you upon arrival. For doctor visits, patients may have 1 support person aged 67 or older with them. For treatment visits, patients cannot have anyone with them due to current Covid guidelines and our immunocompromised population.

## 2021-03-05 NOTE — Patient Instructions (Signed)
[facility]  Discharge Instructions: Thank you for choosing [facility] to provide your oncology and hematology care.   If you have a lab appointment with the Cancer Center, please go directly to the Cancer Center and check in at the registration area.   Wear comfortable clothing and clothing appropriate for easy access to any Portacath or PICC line.   We strive to give you quality time with your provider. You may need to reschedule your appointment if you arrive late (15 or more minutes).  Arriving late affects you and other patients whose appointments are after yours.  Also, if you miss three or more appointments without notifying the office, you may be dismissed from the clinic at the provider's discretion.      For prescription refill requests, have your pharmacy contact our office and allow 72 hours for refills to be completed.    Today you received the following chemotherapy and/or immunotherapy agents Pembrolizumab (Keytruda) and Pemetrexed (Alimta)      To help prevent nausea and vomiting after your treatment, we encourage you to take your nausea medication as directed.  BELOW ARE SYMPTOMS THAT SHOULD BE REPORTED IMMEDIATELY: *FEVER GREATER THAN 100.4 F (38 C) OR HIGHER *CHILLS OR SWEATING *NAUSEA AND VOMITING THAT IS NOT CONTROLLED WITH YOUR NAUSEA MEDICATION *UNUSUAL SHORTNESS OF BREATH *UNUSUAL BRUISING OR BLEEDING *URINARY PROBLEMS (pain or burning when urinating, or frequent urination) *BOWEL PROBLEMS (unusual diarrhea, constipation, pain near the anus) TENDERNESS IN MOUTH AND THROAT WITH OR WITHOUT PRESENCE OF ULCERS (sore throat, sores in mouth, or a toothache) UNUSUAL RASH, SWELLING OR PAIN  UNUSUAL VAGINAL DISCHARGE OR ITCHING   Items with * indicate a potential emergency and should be followed up as soon as possible or go to the Emergency Department if any problems should occur.  Please show the CHEMOTHERAPY ALERT CARD or  IMMUNOTHERAPY ALERT CARD at check-in to the Emergency Department and triage nurse.  Should you have questions after your visit or need to cancel or reschedule your appointment, please contact [facility]  Dept: [phone]  and follow the prompts.  Office hours are 8:00 a.m. to 4:30 p.m. Monday - Friday. Please note that voicemails left after 4:00 p.m. may not be returned until the following business day.  We are closed weekends and major holidays. You have access to a nurse at all times for urgent questions. Please call the main number to the clinic Dept: [phone] and follow the prompts.   For any non-urgent questions, you may also contact your provider using MyChart. We now offer e-Visits for anyone 18 and older to request care online for non-urgent symptoms. For details visit [email or web address].   Also download the MyChart app! Go to the app store, search "MyChart", open the app, select [facility], and log in with your MyChart username and password.  Due to Covid, a mask is required upon entering the hospital/clinic. If you do not have a mask, one will be given to you upon arrival. For doctor visits, patients may have 1 support person aged 18 or older with them. For treatment visits, patients cannot have anyone with them due to current Covid guidelines and our immunocompromised population.   

## 2021-03-19 NOTE — Progress Notes (Signed)
[facility] OFFICE PROGRESS NOTE  [name], MD [place] [id number]  DIAGNOSIS:  Stage IV (T3, N2, M1c) non-small cell lung cancer favoring adenocarcinoma presented with right upper lobe lung mass in addition to right lower lobe, left lower lobe in addition to right hilar and mediastinal lymphadenopathy in addition to metastatic brain lesions diagnosed in October 2021.   Molecular studies by [employer]:   [id number], 9.5%,   PRIOR THERAPY: SRS to 3 brain lesion under the care of Dr. Lisbeth Renshaw.  Completed on April 20, 2020  CURRENT THERAPY: Systemic chemotherapy with carboplatin for AUC of 5, Alimta 500 mg/M2 and Keytruda 200 mg IV every 3 weeks.  First dose April 24, 2020.  Status post 16 cycles.  Starting from cycle #5 the patient will be on maintenance treatment with Alimta and Keytruda every 3 weeks.  INTERVAL HISTORY: Nicolas Allen 65 y.o. male returns to the clinic today for a follow-up visit. The patient is feeling well today without any concerning complaints today. He tolerated his last cycle of treatment well without any concerning adverse side effects.  He denies any fever, chills, night sweats, or weight loss.  He denies any nausea, vomiting, diarrhea, or constipation.  He denies any chest pain, shortness of breath, or hemoptysis.  He has a mild baseline cough and states he uses cough drops or sucks on candy for. He had one mild sharp shooting headache last week which resolved with aleve. He denies any other headaches or visual changes. He has some hypopigmentation on the skin on the left side of his face which may be treatment related. He is here today for evaluation and repeat blood work before starting cycle #17.    MEDICAL HISTORY: Past Medical History:  Diagnosis Date   Anemia 06/24/2012   ANXIETY 02/03/2007   Cancer ([place])    Cervical disc disease 02/12/2012   S/p surgury jan 2013   Erectile dysfunction 02/11/2011   GERD  (gastroesophageal reflux disease)    GLUCOSE INTOLERANCE 01/04/2008   HYPERLIPIDEMIA 02/03/2007   HYPERTENSION 02/03/2007   IBS (irritable bowel syndrome)    Impaired glucose tolerance 02/11/2011   Smoker 08/22/2014   Substance abuse ([place]) 2001   sober for 16 yrs    ALLERGIES:  has No Known Allergies.  MEDICATIONS:  Current Outpatient Medications  Medication Sig Dispense Refill   amLODipine-benazepril (LOTREL) 10-20 MG capsule TAKE 1 CAPSULE BY MOUTH ONCE DAILY 90 capsule 3   aspirin EC 81 MG tablet Take 81 mg by mouth daily. Swallow whole.     b complex vitamins tablet Take 1 tablet by mouth daily.     cholecalciferol (VITAMIN D3) 25 MCG (1000 UNIT) tablet Take 1,000 Units by mouth daily.     folic acid (FOLVITE) 1 MG tablet Take 1 tablet (1 mg total) by mouth daily. 30 tablet 4   Garlic 1194 MG CAPS Take 1,000 mg by mouth daily.      ibuprofen (ADVIL) 200 MG tablet Take 200 mg by mouth every 6 (six) hours as needed.     lidocaine-prilocaine (EMLA) cream Apply to the Port-A-Cath site 30-60 minutes before chemotherapy. 30 g 0   Omega-3 Fatty Acids (FISH OIL PO) Take 1 capsule by mouth daily.      polyethylene glycol (MIRALAX / GLYCOLAX) 17 g packet Take 17 g by mouth daily as needed.     prochlorperazine (COMPAZINE) 10 MG tablet Take 1 tablet (10 mg total) by mouth every  6 (six) hours as needed for nausea or vomiting. 30 tablet 0   rosuvastatin (CRESTOR) 20 MG tablet Take 1 tablet (20 mg total) by mouth daily. 90 tablet 3   vardenafil (LEVITRA) 20 MG tablet TAKE 1 TABLET BY MOUTH AS NEEDED FOR  ERECTILE  DYSFUNCTION 10 tablet 5   No current facility-administered medications for this visit.   Facility-Administered Medications Ordered in Other Visits  Medication Dose Route Frequency Provider Last Rate Last Admin   heparin lock flush 100 unit/mL  500 Units Intracatheter Once PRN Curt Bears, MD       pembrolizumab [facility] [address]) 200 mg in sodium chloride 0.9 % 50 mL chemo infusion  200 mg  Intravenous Once Curt Bears, MD       PEMEtrexed (ALIMTA) 900 mg in sodium chloride 0.9 % 100 mL chemo infusion  500 mg/m2 (Treatment Plan Recorded) Intravenous Once Curt Bears, MD       prochlorperazine (COMPAZINE) tablet 10 mg  10 mg Oral Once Curt Bears, MD       sodium chloride flush (NS) 0.9 % injection 10 mL  10 mL Intracatheter PRN Curt Bears, MD        SURGICAL HISTORY:  Past Surgical History:  Procedure Laterality Date   BRONCHIAL BIOPSY  03/30/2020   Procedure: BRONCHIAL BIOPSIES;  Surgeon: Garner Nash, DO;  Location: [facility];  Service: Pulmonary;;   BRONCHIAL BRUSHINGS  03/30/2020   Procedure: BRONCHIAL BRUSHINGS;  Surgeon: Garner Nash, DO;  Location: [facility];  Service: Pulmonary;;   BRONCHIAL NEEDLE ASPIRATION BIOPSY  03/30/2020   Procedure: BRONCHIAL NEEDLE ASPIRATION BIOPSIES;  Surgeon: Garner Nash, DO;  Location: [facility];  Service: Pulmonary;;   BRONCHIAL WASHINGS  03/30/2020   Procedure: BRONCHIAL WASHINGS;  Surgeon: Garner Nash, DO;  Location: [facility];  Service: Pulmonary;;   COLONOSCOPY  2007   IR IMAGING GUIDED PORT INSERTION  04/23/2020   neck fusion  2012   C4   NO PAST SURGERIES     VIDEO BRONCHOSCOPY WITH ENDOBRONCHIAL NAVIGATION N/A 03/30/2020   Procedure: VIDEO BRONCHOSCOPY WITH ENDOBRONCHIAL NAVIGATION;  Surgeon: Garner Nash, DO;  Location: [facility];  Service: Pulmonary;  Laterality: N/A;   VIDEO BRONCHOSCOPY WITH ENDOBRONCHIAL ULTRASOUND N/A 03/30/2020   Procedure: VIDEO BRONCHOSCOPY WITH ENDOBRONCHIAL ULTRASOUND;  Surgeon: Garner Nash, DO;  Location: [facility];  Service: Pulmonary;  Laterality: N/A;    REVIEW OF SYSTEMS:   Review of Systems  Constitutional: Negative for appetite change, chills, fatigue, fever and unexpected weight change.  HENT:   Negative for mouth sores, nosebleeds, sore throat and trouble swallowing.   Eyes: Negative for eye problems and icterus.   Respiratory: Positive for baseline cough. Negative for hemoptysis, shortness of breath and wheezing.   Cardiovascular: Negative for chest pain and leg swelling.  Gastrointestinal: Negative for abdominal pain, constipation, diarrhea, nausea and vomiting.  Genitourinary: Negative for bladder incontinence, difficulty urinating, dysuria, frequency and hematuria.   Musculoskeletal: Negative for back pain, gait problem, neck pain and neck stiffness.  Skin: Positive for hypopigmentation.   Neurological: Negative for dizziness, extremity weakness, gait problem, headaches, light-headedness and seizures.  Hematological: Negative for adenopathy. Does not bruise/bleed easily.  Psychiatric/Behavioral: Negative for confusion, depression and sleep disturbance. The patient is not nervous/anxious.      PHYSICAL EXAMINATION:  Blood pressure 127/62, pulse 77, temperature (!) 96.8 F (36 C), temperature source Tympanic, resp. rate 17, weight 166 lb 1.6 oz (75.3 kg), SpO2 100 %.  ECOG PERFORMANCE STATUS: 1  Physical Exam  Constitutional: Oriented to person, place, and time and thin appearing male, and in no distress.  HENT:  Head: Normocephalic and atraumatic.  Mouth/Throat: Oropharynx is clear and moist. No oropharyngeal exudate.  Eyes: Conjunctivae are normal. Right eye exhibits no discharge. Left eye exhibits no discharge. No scleral icterus.  Neck: Normal range of motion. Neck supple.  Cardiovascular: Normal rate, regular rhythm, normal heart sounds and intact distal pulses.   Pulmonary/Chest: Effort normal and breath sounds normal. No respiratory distress. No wheezes. No rales.  Abdominal: Soft. Bowel sounds are normal. Exhibits no distension and no mass. There is no tenderness.  Musculoskeletal: Normal range of motion. Exhibits no edema.  Lymphadenopathy:    No cervical adenopathy.  Neurological: Alert and oriented to person, place, and time. Exhibits normal muscle tone. Gait normal. Coordination  normal.  Skin: Positive for hypopigmentation/patches on face only. Skin is warm and dry. Not diaphoretic. No erythema. No pallor.  Psychiatric: Mood, memory and judgment normal.  Vitals reviewed.    LABORATORY DATA: Lab Results  Component Value Date   WBC 4.9 03/26/2021   HGB 11.0 (L) 03/26/2021   HCT 32.4 (L) 03/26/2021   MCV 84.6 03/26/2021   PLT 156 03/26/2021      Chemistry      Component Value Date/Time   NA 138 03/26/2021 0859   K 3.5 03/26/2021 0859   CL 105 03/26/2021 0859   CO2 23 03/26/2021 0859   BUN 11 03/26/2021 0859   CREATININE 1.26 (H) 03/26/2021 0859   CREATININE 0.88 02/21/2020 1115      Component Value Date/Time   CALCIUM 9.1 03/26/2021 0859   ALKPHOS 51 03/26/2021 0859   AST 32 03/26/2021 0859   ALT 38 03/26/2021 0859   BILITOT 0.3 03/26/2021 0859       RADIOGRAPHIC STUDIES:  CT Chest W Contrast  Result Date: 03/01/2021 CLINICAL DATA:  Non-small cell lung cancer. Assess treatment response. Chemotherapy and immunotherapy ongoing. EXAM: CT CHEST, ABDOMEN, AND PELVIS WITH CONTRAST TECHNIQUE: Multidetector CT imaging of the chest, abdomen and pelvis was performed following the standard protocol during bolus administration of intravenous contrast. CONTRAST:  81mL OMNIPAQUE IOHEXOL 350 MG/ML SOLN COMPARISON:  CT 12/28/2020.  PET-CT 04/02/2020. FINDINGS: CT CHEST FINDINGS Cardiovascular: Right IJ Port-A-Cath extends to the superior cavoatrial junction. No acute vascular findings are identified. Mild aortic and great vessel atherosclerosis. The heart size is normal. There is no pericardial effusion. Mediastinum/Nodes: There are no enlarged mediastinal, hilar or axillary lymph nodes. Small mediastinal and axillary lymph nodes are stable the thyroid gland, trachea and esophagus demonstrate no significant findings. Lungs/Pleura: No pleural effusion or pneumothorax. Again demonstrated are multiple ground-glass and part solid right lung nodules. The treated lesion  inferiorly in the right upper lobe measures 3.8 x 1.0 cm on image 64/4, similar to the most recent study (4.1 x 1.0 cm). Part solid right upper lobe nodule measures 1.2 x 0.8 cm on image 27/4, not significantly changed. Part solid right lower nodule posterior to the bronchus intermedius measures 1.9 x 1.0 cm on image 66/4, not significantly changed. Part solid right lower lobe nodule measures 2.6 x 2.5 cm on image 83/4, not significantly changed. 1 cm part solid nodule in the right middle lobe on image 74/4 is also stable. There is one new nodule in the right upper lobe, measuring 0.7 cm on image 25/4. The dominant cavitary and solid left lower lobe nodule has not significantly changed in overall size, measuring up to 3.0 x 2.9  cm on image 110/4. Posterior solid components appear slightly smaller, measuring 1.7 x 0.9 cm on image 117/4, previously up to 1.9 x 1.2 cm. No other new or enlarging nodules identified. Musculoskeletal/Chest wall: No chest wall mass or suspicious osseous findings. CT ABDOMEN AND PELVIS FINDINGS Hepatobiliary: The liver is normal in density without suspicious focal abnormality. No evidence of gallstones, gallbladder wall thickening or biliary dilatation. Pancreas: The pancreas appears stable without mass lesion, ductal dilatation or surrounding inflammation. Possible pancreas divisum. Spleen: Normal in size without focal abnormality. Adrenals/Urinary Tract: Both adrenal glands appear normal. The kidneys appear normal without evidence of urinary tract calculus, suspicious lesion or hydronephrosis. No bladder abnormalities are seen. Stomach/Bowel: Enteric contrast was administered and has passed into the distal transverse colon. The stomach appears unremarkable for its degree of distension. No evidence of bowel wall thickening, distention or surrounding inflammatory change. Appendix appears normal. Vascular/Lymphatic: There are no enlarged abdominal or pelvic lymph nodes. Moderate aortic and  branch vessel atherosclerosis without aneurysm or acute vascular findings. The portal, superior mesenteric and splenic veins are patent. Reproductive: The prostate gland and seminal vesicles appear unremarkable. Other: No evidence of abdominal wall mass or hernia. No ascites. Musculoskeletal: No acute or significant osseous findings. Stable subcutaneous nodules within the left lower chest and upper abdominal anterior wall, likely sebaceous cysts. IMPRESSION: 1. The treated nodule inferiorly in the right upper lobe has not significantly changed in size. 2. There is one new part solid right upper lobe nodule measuring 0.7 cm which may reflect a metastasis. Attention on follow-up recommended. 3. The multiple additional part solid right lung nodules have not significantly changed. The dominant cavitary part solid left lower lobe nodule is similar in overall size, although the solid components have slightly improved. 4. No evidence of metastatic disease in the abdomen or pelvis. No thoracic adenopathy. 5.  Aortic Atherosclerosis (ICD10-I70.0). Electronically Signed   By: Richardean Sale M.D.   On: 03/01/2021 10:44   CT Abdomen Pelvis W Contrast  Result Date: 03/01/2021 CLINICAL DATA:  Non-small cell lung cancer. Assess treatment response. Chemotherapy and immunotherapy ongoing. EXAM: CT CHEST, ABDOMEN, AND PELVIS WITH CONTRAST TECHNIQUE: Multidetector CT imaging of the chest, abdomen and pelvis was performed following the standard protocol during bolus administration of intravenous contrast. CONTRAST:  11mL OMNIPAQUE IOHEXOL 350 MG/ML SOLN COMPARISON:  CT 12/28/2020.  PET-CT 04/02/2020. FINDINGS: CT CHEST FINDINGS Cardiovascular: Right IJ Port-A-Cath extends to the superior cavoatrial junction. No acute vascular findings are identified. Mild aortic and great vessel atherosclerosis. The heart size is normal. There is no pericardial effusion. Mediastinum/Nodes: There are no enlarged mediastinal, hilar or axillary lymph  nodes. Small mediastinal and axillary lymph nodes are stable the thyroid gland, trachea and esophagus demonstrate no significant findings. Lungs/Pleura: No pleural effusion or pneumothorax. Again demonstrated are multiple ground-glass and part solid right lung nodules. The treated lesion inferiorly in the right upper lobe measures 3.8 x 1.0 cm on image 64/4, similar to the most recent study (4.1 x 1.0 cm). Part solid right upper lobe nodule measures 1.2 x 0.8 cm on image 27/4, not significantly changed. Part solid right lower nodule posterior to the bronchus intermedius measures 1.9 x 1.0 cm on image 66/4, not significantly changed. Part solid right lower lobe nodule measures 2.6 x 2.5 cm on image 83/4, not significantly changed. 1 cm part solid nodule in the right middle lobe on image 74/4 is also stable. There is one new nodule in the right upper lobe, measuring 0.7 cm on  image 25/4. The dominant cavitary and solid left lower lobe nodule has not significantly changed in overall size, measuring up to 3.0 x 2.9 cm on image 110/4. Posterior solid components appear slightly smaller, measuring 1.7 x 0.9 cm on image 117/4, previously up to 1.9 x 1.2 cm. No other new or enlarging nodules identified. Musculoskeletal/Chest wall: No chest wall mass or suspicious osseous findings. CT ABDOMEN AND PELVIS FINDINGS Hepatobiliary: The liver is normal in density without suspicious focal abnormality. No evidence of gallstones, gallbladder wall thickening or biliary dilatation. Pancreas: The pancreas appears stable without mass lesion, ductal dilatation or surrounding inflammation. Possible pancreas divisum. Spleen: Normal in size without focal abnormality. Adrenals/Urinary Tract: Both adrenal glands appear normal. The kidneys appear normal without evidence of urinary tract calculus, suspicious lesion or hydronephrosis. No bladder abnormalities are seen. Stomach/Bowel: Enteric contrast was administered and has passed into the distal  transverse colon. The stomach appears unremarkable for its degree of distension. No evidence of bowel wall thickening, distention or surrounding inflammatory change. Appendix appears normal. Vascular/Lymphatic: There are no enlarged abdominal or pelvic lymph nodes. Moderate aortic and branch vessel atherosclerosis without aneurysm or acute vascular findings. The portal, superior mesenteric and splenic veins are patent. Reproductive: The prostate gland and seminal vesicles appear unremarkable. Other: No evidence of abdominal wall mass or hernia. No ascites. Musculoskeletal: No acute or significant osseous findings. Stable subcutaneous nodules within the left lower chest and upper abdominal anterior wall, likely sebaceous cysts. IMPRESSION: 1. The treated nodule inferiorly in the right upper lobe has not significantly changed in size. 2. There is one new part solid right upper lobe nodule measuring 0.7 cm which may reflect a metastasis. Attention on follow-up recommended. 3. The multiple additional part solid right lung nodules have not significantly changed. The dominant cavitary part solid left lower lobe nodule is similar in overall size, although the solid components have slightly improved. 4. No evidence of metastatic disease in the abdomen or pelvis. No thoracic adenopathy. 5.  Aortic Atherosclerosis (ICD10-I70.0). Electronically Signed   By: Richardean Sale M.D.   On: 03/01/2021 10:44     ASSESSMENT/PLAN:  This is a very pleasant 65 year old African-American male diagnosed with stage IV (T3, N2, M1C) non-small cell lung cancer, adenocarcinoma.  He presented with a right upper lobe lung mass in addition to a right lower lobe, left lower lobe lung lesions with right hilar and mediastinal lymphadenopathy.  He also had metastatic disease to the brain.  He was diagnosed in October 2022.  His molecular studies by [employer] did not show any actionable mutations.  The patient underwent SRS treatment to the  metastatic brain lesions under the care of Dr. Lisbeth Renshaw.  This was completed on 04/20/2021.  The patient is currently undergoing systemic chemotherapy/immunotherapy.  He started with carboplatin for an AUC of 5, Alimta 500 mg per metered squared, Keytruda 200 mg IV every 3 weeks.  He is status post 16 cycles.  Starting from cycle #5, the patient has been on maintenance treatment with Alimta and Keytruda IV every 3 weeks.   Labs were reviewed.  Recommend that heproceed with cycle #17 today as scheduled.   We will see him back for follow-up visit in 3 weeks for evaluation before starting cycle #18.  No recurrent headaches at this time. He only had one mild headache last week. Advised to use tylenol instead of aleve. If knows to call us if he has increased frequency of headaches.   The patient was advised to call  immediately if he has any concerning symptoms in the interval. The patient voices understanding of current disease status and treatment options and is in agreement with the current care plan. All questions were answered. The patient knows to call the clinic with any problems, questions or concerns. We can certainly see the patient much sooner if necessary        No orders of the defined types were placed in this encounter.    The total time spent in the appointment was 20-29 minutes.   Nicolas Carmen L Ourania Hamler, PA-C 03/26/21

## 2021-03-26 ENCOUNTER — Inpatient Hospital Stay: Payer: 59

## 2021-03-26 ENCOUNTER — Inpatient Hospital Stay: Payer: 59 | Attending: Internal Medicine

## 2021-03-26 ENCOUNTER — Inpatient Hospital Stay (HOSPITAL_BASED_OUTPATIENT_CLINIC_OR_DEPARTMENT_OTHER): Payer: 59 | Admitting: Physician Assistant

## 2021-03-26 ENCOUNTER — Other Ambulatory Visit: Payer: Self-pay

## 2021-03-26 VITALS — BP 127/62 | HR 77 | Temp 96.8°F | Resp 17 | Wt 166.1 lb

## 2021-03-26 DIAGNOSIS — C3411 Malignant neoplasm of upper lobe, right bronchus or lung: Secondary | ICD-10-CM | POA: Insufficient documentation

## 2021-03-26 DIAGNOSIS — C3432 Malignant neoplasm of lower lobe, left bronchus or lung: Secondary | ICD-10-CM | POA: Insufficient documentation

## 2021-03-26 DIAGNOSIS — C3491 Malignant neoplasm of unspecified part of right bronchus or lung: Secondary | ICD-10-CM

## 2021-03-26 DIAGNOSIS — Z5111 Encounter for antineoplastic chemotherapy: Secondary | ICD-10-CM | POA: Insufficient documentation

## 2021-03-26 DIAGNOSIS — Z87891 Personal history of nicotine dependence: Secondary | ICD-10-CM | POA: Diagnosis not present

## 2021-03-26 DIAGNOSIS — Z5112 Encounter for antineoplastic immunotherapy: Secondary | ICD-10-CM | POA: Diagnosis present

## 2021-03-26 DIAGNOSIS — C3431 Malignant neoplasm of lower lobe, right bronchus or lung: Secondary | ICD-10-CM | POA: Diagnosis not present

## 2021-03-26 DIAGNOSIS — Z95828 Presence of other vascular implants and grafts: Secondary | ICD-10-CM

## 2021-03-26 DIAGNOSIS — C7931 Secondary malignant neoplasm of brain: Secondary | ICD-10-CM | POA: Diagnosis not present

## 2021-03-26 DIAGNOSIS — E538 Deficiency of other specified B group vitamins: Secondary | ICD-10-CM

## 2021-03-26 LAB — CBC WITH DIFFERENTIAL (CANCER CENTER ONLY)
Abs Immature Granulocytes: 0.01 10*3/uL (ref 0.00–0.07)
Basophils Absolute: 0 10*3/uL (ref 0.0–0.1)
Basophils Relative: 0 %
Eosinophils Absolute: 0.2 10*3/uL (ref 0.0–0.5)
Eosinophils Relative: 3 %
HCT: 32.4 % — ABNORMAL LOW (ref 39.0–52.0)
Hemoglobin: 11 g/dL — ABNORMAL LOW (ref 13.0–17.0)
Immature Granulocytes: 0 %
Lymphocytes Relative: 38 %
Lymphs Abs: 1.9 10*3/uL (ref 0.7–4.0)
MCH: 28.7 pg (ref 26.0–34.0)
MCHC: 34 g/dL (ref 30.0–36.0)
MCV: 84.6 fL (ref 80.0–100.0)
Monocytes Absolute: 0.5 10*3/uL (ref 0.1–1.0)
Monocytes Relative: 11 %
Neutro Abs: 2.4 10*3/uL (ref 1.7–7.7)
Neutrophils Relative %: 48 %
Platelet Count: 156 10*3/uL (ref 150–400)
RBC: 3.83 MIL/uL — ABNORMAL LOW (ref 4.22–5.81)
RDW: 15.5 % (ref 11.5–15.5)
WBC Count: 4.9 10*3/uL (ref 4.0–10.5)
nRBC: 0 % (ref 0.0–0.2)

## 2021-03-26 LAB — CMP (CANCER CENTER ONLY)
ALT: 38 U/L (ref 0–44)
AST: 32 U/L (ref 15–41)
Albumin: 3.3 g/dL — ABNORMAL LOW (ref 3.5–5.0)
Alkaline Phosphatase: 51 U/L (ref 38–126)
Anion gap: 10 (ref 5–15)
BUN: 11 mg/dL (ref 8–23)
CO2: 23 mmol/L (ref 22–32)
Calcium: 9.1 mg/dL (ref 8.9–10.3)
Chloride: 105 mmol/L (ref 98–111)
Creatinine: 1.26 mg/dL — ABNORMAL HIGH (ref 0.61–1.24)
GFR, Estimated: >60 mL/min (ref >60)
Glucose, Bld: 194 mg/dL — ABNORMAL HIGH (ref 70–99)
Potassium: 3.5 mmol/L (ref 3.5–5.1)
Sodium: 138 mmol/L (ref 135–145)
Total Bilirubin: 0.3 mg/dL (ref 0.3–1.2)
Total Protein: 7.1 g/dL (ref 6.5–8.1)

## 2021-03-26 MED ORDER — SODIUM CHLORIDE 0.9% FLUSH
10.0000 mL | INTRAVENOUS | Status: DC | PRN
  Administered 2021-03-26: 10 mL

## 2021-03-26 MED ORDER — HEPARIN SOD (PORK) LOCK FLUSH 100 UNIT/ML IV SOLN
500.0000 [IU] | Freq: Once | INTRAVENOUS | Status: AC | PRN
  Administered 2021-03-26: 500 [IU]

## 2021-03-26 MED ORDER — PROCHLORPERAZINE MALEATE 10 MG PO TABS
10.0000 mg | ORAL_TABLET | Freq: Once | ORAL | Status: AC
  Administered 2021-03-26: 10 mg via ORAL
  Filled 2021-03-26: qty 1

## 2021-03-26 MED ORDER — SODIUM CHLORIDE 0.9 % IV SOLN
200.0000 mg | Freq: Once | INTRAVENOUS | Status: AC
  Administered 2021-03-26: 200 mg via INTRAVENOUS
  Filled 2021-03-26: qty 8

## 2021-03-26 MED ORDER — SODIUM CHLORIDE 0.9% FLUSH
10.0000 mL | Freq: Once | INTRAVENOUS | Status: AC
  Administered 2021-03-26: 10 mL

## 2021-03-26 MED ORDER — SODIUM CHLORIDE 0.9 % IV SOLN: Freq: Once | INTRAVENOUS | Status: AC

## 2021-03-26 MED ORDER — SODIUM CHLORIDE 0.9 % IV SOLN
500.0000 mg/m2 | Freq: Once | INTRAVENOUS | Status: AC
  Administered 2021-03-26: 900 mg via INTRAVENOUS
  Filled 2021-03-26: qty 20

## 2021-03-26 NOTE — Patient Instructions (Signed)
[facility]   Discharge Instructions: Thank you for choosing [facility] to provide your oncology and hematology care.   If you have a lab appointment with the [facility], please go directly to the [facility] and check in at the registration area.   Wear comfortable clothing and clothing appropriate for easy access to any Portacath or PICC line.   We strive to give you quality time with your provider. You may need to reschedule your appointment if you arrive late (15 or more minutes).  Arriving late affects you and other patients whose appointments are after yours.  Also, if you miss three or more appointments without notifying the office, you may be dismissed from the clinic at the provider's discretion.      For prescription refill requests, have your pharmacy contact our office and allow 72 hours for refills to be completed.    Today you received the following chemotherapy and/or immunotherapy agents: pemetrexed and pembrolizumab.      To help prevent nausea and vomiting after your treatment, we encourage you to take your nausea medication as directed.  BELOW ARE SYMPTOMS THAT SHOULD BE REPORTED IMMEDIATELY: *FEVER GREATER THAN 100.4 F (38 C) OR HIGHER *CHILLS OR SWEATING *NAUSEA AND VOMITING THAT IS NOT CONTROLLED WITH YOUR NAUSEA MEDICATION *UNUSUAL SHORTNESS OF BREATH *UNUSUAL BRUISING OR BLEEDING *URINARY PROBLEMS (pain or burning when urinating, or frequent urination) *BOWEL PROBLEMS (unusual diarrhea, constipation, pain near the anus) TENDERNESS IN MOUTH AND THROAT WITH OR WITHOUT PRESENCE OF ULCERS (sore throat, sores in mouth, or a toothache) UNUSUAL RASH, SWELLING OR PAIN  UNUSUAL VAGINAL DISCHARGE OR ITCHING   Items with * indicate a potential emergency and should be followed up as soon as possible or go to the Emergency Department if any problems should occur.  Please show the CHEMOTHERAPY ALERT CARD or IMMUNOTHERAPY  ALERT CARD at check-in to the Emergency Department and triage nurse.  Should you have questions after your visit or need to cancel or reschedule your appointment, please contact [facility]  Dept: [phone]  and follow the prompts.  Office hours are 8:00 a.m. to 4:30 p.m. Monday - Friday. Please note that voicemails left after 4:00 p.m. may not be returned until the following business day.  We are closed weekends and major holidays. You have access to a nurse at all times for urgent questions. Please call the main number to the clinic Dept: [phone] and follow the prompts.   For any non-urgent questions, you may also contact your provider using MyChart. We now offer e-Visits for anyone 23 and older to request care online for non-urgent symptoms. For details visit [email or web address].   Also download the MyChart app! Go to the app store, search "MyChart", open the app, select [facility], and log in with your MyChart username and password.  Due to Covid, a mask is required upon entering the hospital/clinic. If you do not have a mask, one will be given to you upon arrival. For doctor visits, patients may have 1 support person aged 83 or older with them. For treatment visits, patients cannot have anyone with them due to current Covid guidelines and our immunocompromised population.

## 2021-04-08 ENCOUNTER — Encounter: Payer: Self-pay | Admitting: Internal Medicine

## 2021-04-08 ENCOUNTER — Other Ambulatory Visit: Payer: Self-pay

## 2021-04-08 ENCOUNTER — Ambulatory Visit: Payer: 59 | Admitting: Internal Medicine

## 2021-04-08 VITALS — BP 108/62 | HR 71 | Ht 72.0 in | Wt 165.6 lb

## 2021-04-08 DIAGNOSIS — I10 Essential (primary) hypertension: Secondary | ICD-10-CM | POA: Diagnosis not present

## 2021-04-08 DIAGNOSIS — G459 Transient cerebral ischemic attack, unspecified: Secondary | ICD-10-CM

## 2021-04-08 DIAGNOSIS — R7302 Impaired glucose tolerance (oral): Secondary | ICD-10-CM | POA: Diagnosis not present

## 2021-04-08 DIAGNOSIS — E7800 Pure hypercholesterolemia, unspecified: Secondary | ICD-10-CM | POA: Diagnosis not present

## 2021-04-08 MED ORDER — ASPIRIN 325 MG PO TBEC: 325.0000 mg | DELAYED_RELEASE_TABLET | Freq: Every day | ORAL | 99 refills | Status: AC

## 2021-04-08 NOTE — Progress Notes (Signed)
Patient ID: Nicolas Allen, male   DOB: [birth year], 65 y.o.   MRN: [id number]        Chief Complaint: follow up left arm numbness       HPI:  Nicolas Allen is a 65 y.o. male here with c/o dense left arm numbness for 2 minutes that was quite obvious and significant to him 2 days ago without neck pain, arm pain, unusual activity of prolonged bending wrist, elbow or shoulder, trauma, fever, redness, swelling.  Just seemed to come and go, has not recurred, no prior hx of  TIA or stroke, though has known hx of lung ca with brain metastasis.  Pt denies chest pain, increased sob or doe, wheezing, orthopnea, PND, increased LE swelling, palpitations, dizziness or syncope.   Pt denies polydipsia, polyuria, or new focal neuro s/s.   Quit tob since sept 6 2021. Good compliance with asa 81 mg.    Wt Readings from Last 3 Encounters:  04/08/21 165 lb 9.6 oz (75.1 kg)  03/26/21 166 lb 1.6 oz (75.3 kg)  03/05/21 165 lb 11.2 oz (75.2 kg)   BP Readings from Last 3 Encounters:  04/08/21 108/62  03/26/21 127/62  03/05/21 124/66         Past Medical History:  Diagnosis Date   Anemia 06/24/2012   ANXIETY 02/03/2007   Cancer ([place])    Cervical disc disease 02/12/2012   S/p surgury jan 2013   Erectile dysfunction 02/11/2011   GERD (gastroesophageal reflux disease)    GLUCOSE INTOLERANCE 01/04/2008   HYPERLIPIDEMIA 02/03/2007   HYPERTENSION 02/03/2007   IBS (irritable bowel syndrome)    Impaired glucose tolerance 02/11/2011   Smoker 08/22/2014   Substance abuse ([place]) 2001   sober for 16 yrs   Past Surgical History:  Procedure Laterality Date   BRONCHIAL BIOPSY  03/30/2020   Procedure: BRONCHIAL BIOPSIES;  Surgeon: Garner Nash, DO;  Location: [facility];  Service: Pulmonary;;   BRONCHIAL BRUSHINGS  03/30/2020   Procedure: BRONCHIAL BRUSHINGS;  Surgeon: Garner Nash, DO;  Location: [facility];  Service: Pulmonary;;   BRONCHIAL NEEDLE ASPIRATION BIOPSY  03/30/2020   Procedure: BRONCHIAL NEEDLE  ASPIRATION BIOPSIES;  Surgeon: Garner Nash, DO;  Location: [facility];  Service: Pulmonary;;   BRONCHIAL WASHINGS  03/30/2020   Procedure: BRONCHIAL WASHINGS;  Surgeon: Garner Nash, DO;  Location: [facility];  Service: Pulmonary;;   COLONOSCOPY  2007   IR IMAGING GUIDED PORT INSERTION  04/23/2020   neck fusion  2012   C4   NO PAST SURGERIES     VIDEO BRONCHOSCOPY WITH ENDOBRONCHIAL NAVIGATION N/A 03/30/2020   Procedure: VIDEO BRONCHOSCOPY WITH ENDOBRONCHIAL NAVIGATION;  Surgeon: Garner Nash, DO;  Location: [facility];  Service: Pulmonary;  Laterality: N/A;   VIDEO BRONCHOSCOPY WITH ENDOBRONCHIAL ULTRASOUND N/A 03/30/2020   Procedure: VIDEO BRONCHOSCOPY WITH ENDOBRONCHIAL ULTRASOUND;  Surgeon: Garner Nash, DO;  Location: [facility];  Service: Pulmonary;  Laterality: N/A;    reports that he quit smoking about 13 months ago. His smoking use included cigarettes. He has a 45.00 pack-year smoking history. He has quit using smokeless tobacco.  His smokeless tobacco use included chew. He reports that he does not drink alcohol and does not use drugs. family history includes Cancer in his cousin and mother; Hypertension in an other family member; Stroke in an other family member. No Known Allergies Current Outpatient Medications on File Prior to Visit  Medication Sig Dispense Refill   amLODipine-benazepril (LOTREL) 10-20 MG capsule  TAKE 1 CAPSULE BY MOUTH ONCE DAILY 90 capsule 3   b complex vitamins tablet Take 1 tablet by mouth daily.     cholecalciferol (VITAMIN D3) 25 MCG (1000 UNIT) tablet Take 1,000 Units by mouth daily.     folic acid (FOLVITE) 1 MG tablet Take 1 tablet (1 mg total) by mouth daily. 30 tablet 4   Garlic 1191 MG CAPS Take 1,000 mg by mouth daily.      ibuprofen (ADVIL) 200 MG tablet Take 200 mg by mouth every 6 (six) hours as needed.     lidocaine-prilocaine (EMLA) cream Apply to the Port-A-Cath site 30-60 minutes before chemotherapy. 30 g 0   Omega-3  Fatty Acids (FISH OIL PO) Take 1 capsule by mouth daily.      polyethylene glycol (MIRALAX / GLYCOLAX) 17 g packet Take 17 g by mouth daily as needed.     prochlorperazine (COMPAZINE) 10 MG tablet Take 1 tablet (10 mg total) by mouth every 6 (six) hours as needed for nausea or vomiting. 30 tablet 0   rosuvastatin (CRESTOR) 20 MG tablet Take 1 tablet (20 mg total) by mouth daily. 90 tablet 3   vardenafil (LEVITRA) 20 MG tablet TAKE 1 TABLET BY MOUTH AS NEEDED FOR  ERECTILE  DYSFUNCTION 10 tablet 5   No current facility-administered medications on file prior to visit.        ROS:  All others reviewed and negative.  Objective        PE:  BP 108/62 (BP Location: Left Arm, Patient Position: Sitting, Cuff Size: Normal)   Pulse 71   Ht 6' (1.829 m)   Wt 165 lb 9.6 oz (75.1 kg)   SpO2 98%   BMI 22.46 kg/m                 Constitutional: Pt appears in NAD               HENT: Head: NCAT.                Right Ear: External ear normal.                 Left Ear: External ear normal.                Eyes: . Pupils are equal, round, and reactive to light. Conjunctivae and EOM are normal               Nose: without d/c or deformity               Neck: Neck supple. Gross normal ROM               Cardiovascular: Normal rate and regular rhythm.                 Pulmonary/Chest: Effort normal and breath sounds without rales or wheezing.                Abd:  Soft, NT, ND, + BS, no organomegaly               Neurological: Pt is alert. At baseline orientation, motor grossly intact               Skin: Skin is warm. No rashes, no other new lesions, LE edema - none               Psychiatric: Pt behavior is normal without agitation   Micro: none  Cardiac tracings I have personally interpreted today:  none  Pertinent Radiological findings (summarize): none   Lab Results  Component Value Date   WBC 4.9 03/26/2021   HGB 11.0 (L) 03/26/2021   HCT 32.4 (L) 03/26/2021   PLT 156 03/26/2021   GLUCOSE 194  (H) 03/26/2021   CHOL 187 02/22/2021   TRIG 57.0 02/22/2021   HDL 67.90 02/22/2021   LDLCALC 108 (H) 02/22/2021   ALT 38 03/26/2021   AST 32 03/26/2021   NA 138 03/26/2021   K 3.5 03/26/2021   CL 105 03/26/2021   CREATININE 1.26 (H) 03/26/2021   BUN 11 03/26/2021   CO2 23 03/26/2021   TSH 1.086 03/05/2021   PSA 0.70 02/22/2021   INR 1.0 03/30/2020   HGBA1C 5.7 02/22/2021   Assessment/Plan:  Nicolas Allen is a 65 y.o. Black or African American [2] male with  has a past medical history of Anemia (06/24/2012), ANXIETY (02/03/2007), Cancer ([place]), Cervical disc disease (02/12/2012), Erectile dysfunction (02/11/2011), GERD (gastroesophageal reflux disease), GLUCOSE INTOLERANCE (01/04/2008), HYPERLIPIDEMIA (02/03/2007), HYPERTENSION (02/03/2007), IBS (irritable bowel syndrome), Impaired glucose tolerance (02/11/2011), Smoker (08/22/2014), and Substance abuse ([place]) (2001).  TIA (transient ischemic attack) Exam most c/w TIA vs brain metastasis, for increased asa 325, MRI brain, echo, carotids  Essential hypertension BP Readings from Last 3 Encounters:  04/08/21 108/62  03/26/21 127/62  03/05/21 124/66   Stable, pt to continue medical treatment lotrel   Impaired glucose tolerance Lab Results  Component Value Date   HGBA1C 5.7 02/22/2021   Stable, pt to continue current medical treatment  - diet   Hyperlipidemia Lab Results  Component Value Date   LDLCALC 108 (H) 02/22/2021   uncontrolled, goal ldl < 100, pt states wants to continue current statin crestor 20 for now  Followup: Return in about 3 weeks (around 04/29/2021).  Cathlean Cower, MD 04/15/2021 8:54 AM [facility]

## 2021-04-08 NOTE — Patient Instructions (Addendum)
Ok to change the Aspirin 81 mg to 325 mg per day (coated aspirin only such as Ecotrin)  You will be contacted regarding the referral for: Echocardigram, Carotid artery testing, and Head MRI  Please continue all other medications as before, and refills have been done if requested.  Please have the pharmacy call with any other refills you may need.  Please continue your efforts at being more active, low cholesterol diet, and weight control.  Please keep your appointments with your specialists as you may have planned  Please make an Appointment to return in Nov 14, or sooner if needed

## 2021-04-15 ENCOUNTER — Encounter: Payer: Self-pay | Admitting: Internal Medicine

## 2021-04-15 NOTE — Assessment & Plan Note (Signed)
BP Readings from Last 3 Encounters:  04/08/21 108/62  03/26/21 127/62  03/05/21 124/66   Stable, pt to continue medical treatment lotrel

## 2021-04-15 NOTE — Assessment & Plan Note (Signed)
Lab Results  Component Value Date   HGBA1C 5.7 02/22/2021   Stable, pt to continue current medical treatment  - diet

## 2021-04-15 NOTE — Assessment & Plan Note (Signed)
Exam most c/w TIA vs brain metastasis, for increased asa 325, MRI brain, echo, carotids

## 2021-04-15 NOTE — Assessment & Plan Note (Signed)
Lab Results  Component Value Date   LDLCALC 108 (H) 02/22/2021   uncontrolled, goal ldl < 100, pt states wants to continue current statin crestor 20 for now

## 2021-04-16 ENCOUNTER — Other Ambulatory Visit: Payer: Self-pay

## 2021-04-16 ENCOUNTER — Inpatient Hospital Stay: Payer: 59 | Attending: Internal Medicine

## 2021-04-16 ENCOUNTER — Encounter: Payer: Self-pay | Admitting: Internal Medicine

## 2021-04-16 ENCOUNTER — Inpatient Hospital Stay: Payer: 59 | Admitting: Internal Medicine

## 2021-04-16 ENCOUNTER — Inpatient Hospital Stay: Payer: 59

## 2021-04-16 VITALS — BP 102/60 | HR 85 | Temp 97.1°F | Resp 18 | Wt 167.2 lb

## 2021-04-16 DIAGNOSIS — C3491 Malignant neoplasm of unspecified part of right bronchus or lung: Secondary | ICD-10-CM | POA: Diagnosis not present

## 2021-04-16 DIAGNOSIS — Z87891 Personal history of nicotine dependence: Secondary | ICD-10-CM | POA: Insufficient documentation

## 2021-04-16 DIAGNOSIS — Z5111 Encounter for antineoplastic chemotherapy: Secondary | ICD-10-CM | POA: Insufficient documentation

## 2021-04-16 DIAGNOSIS — Z95828 Presence of other vascular implants and grafts: Secondary | ICD-10-CM

## 2021-04-16 DIAGNOSIS — Z5112 Encounter for antineoplastic immunotherapy: Secondary | ICD-10-CM | POA: Diagnosis not present

## 2021-04-16 DIAGNOSIS — C3411 Malignant neoplasm of upper lobe, right bronchus or lung: Secondary | ICD-10-CM | POA: Insufficient documentation

## 2021-04-16 DIAGNOSIS — Z79899 Other long term (current) drug therapy: Secondary | ICD-10-CM | POA: Insufficient documentation

## 2021-04-16 DIAGNOSIS — C3431 Malignant neoplasm of lower lobe, right bronchus or lung: Secondary | ICD-10-CM | POA: Insufficient documentation

## 2021-04-16 DIAGNOSIS — C3490 Malignant neoplasm of unspecified part of unspecified bronchus or lung: Secondary | ICD-10-CM

## 2021-04-16 DIAGNOSIS — C3432 Malignant neoplasm of lower lobe, left bronchus or lung: Secondary | ICD-10-CM | POA: Insufficient documentation

## 2021-04-16 DIAGNOSIS — C7931 Secondary malignant neoplasm of brain: Secondary | ICD-10-CM | POA: Diagnosis not present

## 2021-04-16 LAB — CBC WITH DIFFERENTIAL (CANCER CENTER ONLY)
Abs Immature Granulocytes: 0.02 10*3/uL (ref 0.00–0.07)
Basophils Absolute: 0 10*3/uL (ref 0.0–0.1)
Basophils Relative: 0 %
Eosinophils Absolute: 0.1 10*3/uL (ref 0.0–0.5)
Eosinophils Relative: 2 %
HCT: 33.5 % — ABNORMAL LOW (ref 39.0–52.0)
Hemoglobin: 11.2 g/dL — ABNORMAL LOW (ref 13.0–17.0)
Immature Granulocytes: 0 %
Lymphocytes Relative: 35 %
Lymphs Abs: 1.8 10*3/uL (ref 0.7–4.0)
MCH: 28.2 pg (ref 26.0–34.0)
MCHC: 33.4 g/dL (ref 30.0–36.0)
MCV: 84.4 fL (ref 80.0–100.0)
Monocytes Absolute: 0.5 10*3/uL (ref 0.1–1.0)
Monocytes Relative: 9 %
Neutro Abs: 2.8 10*3/uL (ref 1.7–7.7)
Neutrophils Relative %: 54 %
Platelet Count: 149 10*3/uL — ABNORMAL LOW (ref 150–400)
RBC: 3.97 MIL/uL — ABNORMAL LOW (ref 4.22–5.81)
RDW: 15.5 % (ref 11.5–15.5)
WBC Count: 5.3 10*3/uL (ref 4.0–10.5)
nRBC: 0 % (ref 0.0–0.2)

## 2021-04-16 LAB — CMP (CANCER CENTER ONLY)
ALT: 57 U/L — ABNORMAL HIGH (ref 0–44)
AST: 75 U/L — ABNORMAL HIGH (ref 15–41)
Albumin: 3.4 g/dL — ABNORMAL LOW (ref 3.5–5.0)
Alkaline Phosphatase: 56 U/L (ref 38–126)
Anion gap: 7 (ref 5–15)
BUN: 15 mg/dL (ref 8–23)
CO2: 26 mmol/L (ref 22–32)
Calcium: 8.6 mg/dL — ABNORMAL LOW (ref 8.9–10.3)
Chloride: 104 mmol/L (ref 98–111)
Creatinine: 1.1 mg/dL (ref 0.61–1.24)
GFR, Estimated: >60 mL/min (ref >60)
Glucose, Bld: 164 mg/dL — ABNORMAL HIGH (ref 70–99)
Potassium: 3.7 mmol/L (ref 3.5–5.1)
Sodium: 137 mmol/L (ref 135–145)
Total Bilirubin: 0.3 mg/dL (ref 0.3–1.2)
Total Protein: 7.2 g/dL (ref 6.5–8.1)

## 2021-04-16 LAB — TSH: TSH: 1.256 u[IU]/mL (ref 0.320–4.118)

## 2021-04-16 MED ORDER — CYANOCOBALAMIN 1000 MCG/ML IJ SOLN
1000.0000 ug | Freq: Once | INTRAMUSCULAR | Status: AC
Start: 2021-04-16 — End: 2021-04-16
  Administered 2021-04-16: 1000 ug via INTRAMUSCULAR
  Filled 2021-04-16: qty 1

## 2021-04-16 MED ORDER — PROCHLORPERAZINE MALEATE 10 MG PO TABS
10.0000 mg | ORAL_TABLET | Freq: Once | ORAL | Status: AC
  Administered 2021-04-16: 10 mg via ORAL
  Filled 2021-04-16: qty 1

## 2021-04-16 MED ORDER — SODIUM CHLORIDE 0.9 % IV SOLN: Freq: Once | INTRAVENOUS | Status: AC

## 2021-04-16 MED ORDER — SODIUM CHLORIDE 0.9% FLUSH
10.0000 mL | INTRAVENOUS | Status: DC | PRN
  Administered 2021-04-16: 10 mL

## 2021-04-16 MED ORDER — HEPARIN SOD (PORK) LOCK FLUSH 100 UNIT/ML IV SOLN
500.0000 [IU] | Freq: Once | INTRAVENOUS | Status: AC | PRN
  Administered 2021-04-16: 500 [IU]

## 2021-04-16 MED ORDER — PEMBROLIZUMAB CHEMO INJECTION 100 MG/4ML
200.0000 mg | Freq: Once | INTRAVENOUS | Status: AC
Start: 2021-04-16 — End: 2021-04-16
  Administered 2021-04-16: 200 mg via INTRAVENOUS
  Filled 2021-04-16: qty 8

## 2021-04-16 MED ORDER — SODIUM CHLORIDE 0.9% FLUSH
10.0000 mL | Freq: Once | INTRAVENOUS | Status: AC
  Administered 2021-04-16: 10 mL

## 2021-04-16 MED ORDER — SODIUM CHLORIDE 0.9 % IV SOLN
500.0000 mg/m2 | Freq: Once | INTRAVENOUS | Status: AC
  Administered 2021-04-16: 900 mg via INTRAVENOUS
  Filled 2021-04-16: qty 20

## 2021-04-16 NOTE — Patient Instructions (Signed)
[facility]  Discharge Instructions: Thank you for choosing [facility] to provide your oncology and hematology care.   If you have a lab appointment with the Cancer Center, please go directly to the Cancer Center and check in at the registration area.   Wear comfortable clothing and clothing appropriate for easy access to any Portacath or PICC line.   We strive to give you quality time with your provider. You may need to reschedule your appointment if you arrive late (15 or more minutes).  Arriving late affects you and other patients whose appointments are after yours.  Also, if you miss three or more appointments without notifying the office, you may be dismissed from the clinic at the provider's discretion.      For prescription refill requests, have your pharmacy contact our office and allow 72 hours for refills to be completed.    Today you received the following chemotherapy and/or immunotherapy agents: Keytruda/Alimta.      To help prevent nausea and vomiting after your treatment, we encourage you to take your nausea medication as directed.  BELOW ARE SYMPTOMS THAT SHOULD BE REPORTED IMMEDIATELY: *FEVER GREATER THAN 100.4 F (38 C) OR HIGHER *CHILLS OR SWEATING *NAUSEA AND VOMITING THAT IS NOT CONTROLLED WITH YOUR NAUSEA MEDICATION *UNUSUAL SHORTNESS OF BREATH *UNUSUAL BRUISING OR BLEEDING *URINARY PROBLEMS (pain or burning when urinating, or frequent urination) *BOWEL PROBLEMS (unusual diarrhea, constipation, pain near the anus) TENDERNESS IN MOUTH AND THROAT WITH OR WITHOUT PRESENCE OF ULCERS (sore throat, sores in mouth, or a toothache) UNUSUAL RASH, SWELLING OR PAIN  UNUSUAL VAGINAL DISCHARGE OR ITCHING   Items with * indicate a potential emergency and should be followed up as soon as possible or go to the Emergency Department if any problems should occur.  Please show the CHEMOTHERAPY ALERT CARD or IMMUNOTHERAPY ALERT CARD at  check-in to the Emergency Department and triage nurse.  Should you have questions after your visit or need to cancel or reschedule your appointment, please contact [facility]  Dept: [phone]  and follow the prompts.  Office hours are 8:00 a.m. to 4:30 p.m. Monday - Friday. Please note that voicemails left after 4:00 p.m. may not be returned until the following business day.  We are closed weekends and major holidays. You have access to a nurse at all times for urgent questions. Please call the main number to the clinic Dept: [phone] and follow the prompts.   For any non-urgent questions, you may also contact your provider using MyChart. We now offer e-Visits for anyone 18 and older to request care online for non-urgent symptoms. For details visit [email or web address].   Also download the MyChart app! Go to the app store, search "MyChart", open the app, select [facility], and log in with your MyChart username and password.  Due to Covid, a mask is required upon entering the hospital/clinic. If you do not have a mask, one will be given to you upon arrival. For doctor visits, patients may have 1 support person aged 18 or older with them. For treatment visits, patients cannot have anyone with them due to current Covid guidelines and our immunocompromised population.   

## 2021-04-16 NOTE — Progress Notes (Signed)
[facility] [phone]   [phone]  OFFICE PROGRESS NOTE  [name], MD [place] [address]  DIAGNOSIS: Stage IV (T3, N2, M1c) non-small cell lung cancer favoring adenocarcinoma presented with right upper lobe lung mass in addition to right lower lobe, left lower lobe in addition to right hilar and mediastinal lymphadenopathy in addition to metastatic brain lesions diagnosed in October 2021.  Molecular studies by [employer]:  [id number], 9.5%,   PRIOR THERAPY: SRS to 3 brain lesion under the care of Dr. Lisbeth Renshaw.  Scheduled for April 20, 2020  CURRENT THERAPY: Systemic chemotherapy with carboplatin for AUC of 5, Alimta 500 mg/M2 and Keytruda 200 mg IV every 3 weeks.  First dose April 24, 2020.  Status post 17 cycles.  Starting from cycle #5 the patient will be on maintenance treatment with Alimta and Keytruda every 3 weeks.  INTERVAL HISTORY: Nicolas Allen 65 y.o. male returns to the clinic today for follow-up visit accompanied by his wife.  The patient is feeling fine today with no concerning complaints.  He mentions that 2 weeks ago he has numbness in his left arm and he was seen by his primary care physician and given a diagnosis of TIA.  He is switching his aspirin to 325 mg p.o. daily.  He denied having any current chest pain, shortness of breath, cough or hemoptysis.  He denied having any fever or chills.  He has no nausea, vomiting, diarrhea or constipation.  He has no headache or visual changes.  The patient is here today for evaluation before starting cycle #18 of his treatment.  MEDICAL HISTORY: Past Medical History:  Diagnosis Date   Anemia 06/24/2012   ANXIETY 02/03/2007   Cancer ([place])    Cervical disc disease 02/12/2012   S/p surgury jan 2013   Erectile dysfunction 02/11/2011   GERD (gastroesophageal reflux disease)    GLUCOSE INTOLERANCE 01/04/2008   HYPERLIPIDEMIA 02/03/2007   HYPERTENSION 02/03/2007    IBS (irritable bowel syndrome)    Impaired glucose tolerance 02/11/2011   Smoker 08/22/2014   Substance abuse ([place]) 2001   sober for 16 yrs    ALLERGIES:  has No Known Allergies.  MEDICATIONS:  Current Outpatient Medications  Medication Sig Dispense Refill   amLODipine-benazepril (LOTREL) 10-20 MG capsule TAKE 1 CAPSULE BY MOUTH ONCE DAILY 90 capsule 3   aspirin 325 MG EC tablet Take 1 tablet (325 mg total) by mouth daily. 90 tablet 99   b complex vitamins tablet Take 1 tablet by mouth daily.     cholecalciferol (VITAMIN D3) 25 MCG (1000 UNIT) tablet Take 1,000 Units by mouth daily.     folic acid (FOLVITE) 1 MG tablet Take 1 tablet (1 mg total) by mouth daily. 30 tablet 4   Garlic 3785 MG CAPS Take 1,000 mg by mouth daily.      lidocaine-prilocaine (EMLA) cream Apply to the Port-A-Cath site 30-60 minutes before chemotherapy. 30 g 0   Omega-3 Fatty Acids (FISH OIL PO) Take 1 capsule by mouth daily.      polyethylene glycol (MIRALAX / GLYCOLAX) 17 g packet Take 17 g by mouth daily as needed.     rosuvastatin (CRESTOR) 20 MG tablet Take 1 tablet (20 mg total) by mouth daily. 90 tablet 3   ibuprofen (ADVIL) 200 MG tablet Take 200 mg by mouth every 6 (six) hours as needed. (Patient not taking: Reported on 04/16/2021)     prochlorperazine (  COMPAZINE) 10 MG tablet Take 1 tablet (10 mg total) by mouth every 6 (six) hours as needed for nausea or vomiting. (Patient not taking: Reported on 04/16/2021) 30 tablet 0   vardenafil (LEVITRA) 20 MG tablet TAKE 1 TABLET BY MOUTH AS NEEDED FOR  ERECTILE  DYSFUNCTION 10 tablet 5   No current facility-administered medications for this visit.    SURGICAL HISTORY:  Past Surgical History:  Procedure Laterality Date   BRONCHIAL BIOPSY  03/30/2020   Procedure: BRONCHIAL BIOPSIES;  Surgeon: Garner Nash, DO;  Location: [facility];  Service: Pulmonary;;   BRONCHIAL BRUSHINGS  03/30/2020   Procedure: BRONCHIAL BRUSHINGS;  Surgeon: Garner Nash, DO;   Location: [facility];  Service: Pulmonary;;   BRONCHIAL NEEDLE ASPIRATION BIOPSY  03/30/2020   Procedure: BRONCHIAL NEEDLE ASPIRATION BIOPSIES;  Surgeon: Garner Nash, DO;  Location: [facility];  Service: Pulmonary;;   BRONCHIAL WASHINGS  03/30/2020   Procedure: BRONCHIAL WASHINGS;  Surgeon: Garner Nash, DO;  Location: [facility];  Service: Pulmonary;;   COLONOSCOPY  2007   IR IMAGING GUIDED PORT INSERTION  04/23/2020   neck fusion  2012   C4   NO PAST SURGERIES     VIDEO BRONCHOSCOPY WITH ENDOBRONCHIAL NAVIGATION N/A 03/30/2020   Procedure: VIDEO BRONCHOSCOPY WITH ENDOBRONCHIAL NAVIGATION;  Surgeon: Garner Nash, DO;  Location: [facility];  Service: Pulmonary;  Laterality: N/A;   VIDEO BRONCHOSCOPY WITH ENDOBRONCHIAL ULTRASOUND N/A 03/30/2020   Procedure: VIDEO BRONCHOSCOPY WITH ENDOBRONCHIAL ULTRASOUND;  Surgeon: Garner Nash, DO;  Location: [facility];  Service: Pulmonary;  Laterality: N/A;    REVIEW OF SYSTEMS:  Constitutional: negative Eyes: negative Ears, nose, mouth, throat, and face: negative Respiratory: negative Cardiovascular: negative Gastrointestinal: negative Genitourinary:negative Integument/breast: negative Hematologic/lymphatic: negative Musculoskeletal:negative Neurological: negative Behavioral/Psych: negative Endocrine: negative Allergic/Immunologic: negative   PHYSICAL EXAMINATION: General appearance: alert, cooperative, and no distress Head: Normocephalic, without obvious abnormality, atraumatic Neck: no adenopathy, no JVD, supple, symmetrical, trachea midline, and thyroid not enlarged, symmetric, no tenderness/mass/nodules Lymph nodes: Cervical, supraclavicular, and axillary nodes normal. Resp: clear to auscultation bilaterally Back: symmetric, no curvature. ROM normal. No CVA tenderness. Cardio: regular rate and rhythm, S1, S2 normal, no murmur, click, rub or gallop GI: soft, non-tender; bowel sounds normal; no masses,  no  organomegaly Extremities: extremities normal, atraumatic, no cyanosis or edema Neurologic: Alert and oriented X 3, normal strength and tone. Normal symmetric reflexes. Normal coordination and gait  ECOG PERFORMANCE STATUS: 0 - Asymptomatic  Blood pressure 102/60, pulse 85, temperature (!) 97.1 F (36.2 C), resp. rate 18, weight 167 lb 4 oz (75.9 kg), SpO2 100 %.  LABORATORY DATA: Lab Results  Component Value Date   WBC 5.3 04/16/2021   HGB 11.2 (L) 04/16/2021   HCT 33.5 (L) 04/16/2021   MCV 84.4 04/16/2021   PLT 149 (L) 04/16/2021      Chemistry      Component Value Date/Time   NA 137 04/16/2021 1000   K 3.7 04/16/2021 1000   CL 104 04/16/2021 1000   CO2 26 04/16/2021 1000   BUN 15 04/16/2021 1000   CREATININE 1.10 04/16/2021 1000   CREATININE 0.88 02/21/2020 1115      Component Value Date/Time   CALCIUM 8.6 (L) 04/16/2021 1000   ALKPHOS 56 04/16/2021 1000   AST 75 (H) 04/16/2021 1000   ALT 57 (H) 04/16/2021 1000   BILITOT 0.3 04/16/2021 1000       RADIOGRAPHIC STUDIES: No results found.   ASSESSMENT AND PLAN: This is  a very pleasant 65 years old African-American male recently diagnosed with a stage IV (T3, N2, M1c)  non-small cell lung cancer, adenocarcinoma presented with right upper lobe lung mass in addition to right lower lobe, left lower lobe in addition to right hilar and mediastinal lymphadenopathy in addition to metastatic brain lesions diagnosed in October 2021. The patient has molecular studies by [employer] that showed no actionable mutations. He underwent SRS treatment to his brain lesion. The patient is currently undergoing systemic chemotherapy with carboplatin for AUC of 5, Alimta 500 mg/M2 and Keytruda 200 mg IV every 3 weeks status post 17 cycles.  Starting from cycle #5 the patient will be on maintenance treatment with Alimta and Keytruda every 3 weeks. The patient continues to tolerate his treatment with maintenance Alimta and Keytruda fairly  well. I recommended for him to proceed with cycle #18 today as planned. I will see him back for follow-up visit in 3 weeks for evaluation with repeat CT scan of the chest, abdomen pelvis for restaging of his disease. The patient was advised to call immediately if he has any concerning symptoms in the interval. The patient voices understanding of current disease status and treatment options and is in agreement with the current care plan.  All questions were answered. The patient knows to call the clinic with any problems, questions or concerns. We can certainly see the patient much sooner if necessary.  Disclaimer: This note was dictated with voice recognition software. Similar sounding words can inadvertently be transcribed and may not be corrected upon review.

## 2021-04-17 ENCOUNTER — Other Ambulatory Visit: Payer: Self-pay

## 2021-04-17 ENCOUNTER — Ambulatory Visit (HOSPITAL_COMMUNITY)
Admission: RE | Admit: 2021-04-17 | Discharge: 2021-04-17 | Disposition: A | Discharge status: Home / Self Care | Payer: 59 | Source: Ambulatory Visit | Attending: Cardiology | Admitting: Cardiology

## 2021-04-17 DIAGNOSIS — G459 Transient cerebral ischemic attack, unspecified: Secondary | ICD-10-CM | POA: Insufficient documentation

## 2021-04-18 ENCOUNTER — Telehealth: Payer: Self-pay | Admitting: Internal Medicine

## 2021-04-18 ENCOUNTER — Encounter: Payer: Self-pay | Admitting: Internal Medicine

## 2021-04-18 NOTE — Telephone Encounter (Signed)
Scheduled follow-up appointment per 11/1 los. Patient is aware.

## 2021-04-22 ENCOUNTER — Other Ambulatory Visit (HOSPITAL_COMMUNITY): Payer: 59

## 2021-04-24 ENCOUNTER — Ambulatory Visit (HOSPITAL_COMMUNITY): Payer: 59 | Attending: Cardiology

## 2021-04-24 ENCOUNTER — Other Ambulatory Visit: Payer: Self-pay

## 2021-04-24 ENCOUNTER — Encounter: Payer: Self-pay | Admitting: Internal Medicine

## 2021-04-24 DIAGNOSIS — G459 Transient cerebral ischemic attack, unspecified: Secondary | ICD-10-CM | POA: Diagnosis present

## 2021-04-24 LAB — ECHOCARDIOGRAM COMPLETE
Area-P 1/2: 2.38 cm2
S' Lateral: 2.7 cm

## 2021-04-27 ENCOUNTER — Other Ambulatory Visit: Payer: Self-pay | Admitting: Internal Medicine

## 2021-04-29 ENCOUNTER — Encounter: Payer: Self-pay | Admitting: Internal Medicine

## 2021-04-29 ENCOUNTER — Ambulatory Visit: Payer: 59 | Admitting: Internal Medicine

## 2021-04-29 ENCOUNTER — Other Ambulatory Visit: Payer: Self-pay

## 2021-04-29 VITALS — BP 100/60 | HR 68 | Temp 98.2°F | Ht 72.0 in | Wt 164.6 lb

## 2021-04-29 DIAGNOSIS — R7302 Impaired glucose tolerance (oral): Secondary | ICD-10-CM | POA: Diagnosis not present

## 2021-04-29 DIAGNOSIS — Z23 Encounter for immunization: Secondary | ICD-10-CM | POA: Diagnosis not present

## 2021-04-29 DIAGNOSIS — E7800 Pure hypercholesterolemia, unspecified: Secondary | ICD-10-CM

## 2021-04-29 DIAGNOSIS — I10 Essential (primary) hypertension: Secondary | ICD-10-CM

## 2021-04-29 DIAGNOSIS — G459 Transient cerebral ischemic attack, unspecified: Secondary | ICD-10-CM | POA: Diagnosis not present

## 2021-04-29 MED ORDER — FOLIC ACID 1 MG PO TABS: 1.0000 mg | ORAL_TABLET | Freq: Every day | ORAL | 3 refills | Status: DC

## 2021-04-29 NOTE — Assessment & Plan Note (Signed)
Lab Results  Component Value Date   HGBA1C 5.7 02/22/2021   Stable, pt to continue current medical treatment  - diet

## 2021-04-29 NOTE — Assessment & Plan Note (Signed)
Lab Results  Component Value Date   LDLCALC 108 (H) 02/22/2021   Uncontrolled, goal ldl < 70, pt to continue current statin crestor 20, declines other increase for now, for lower chol diet, and f/u lab next visit

## 2021-04-29 NOTE — Patient Instructions (Signed)
You had the Shingles shot #2 today  Please continue all other medications as before, and refills have been done if requested.  Please have the pharmacy call with any other refills you may need.  Please continue your efforts at being more active, low cholesterol diet, and weight control.  Please keep your appointments with your specialists as you may have planned - the MRI for Dec 1, and the CT scans for Nov 21 for Dr Earlie Server  Please make an Appointment to return in 6 months, or sooner if needed

## 2021-04-29 NOTE — Assessment & Plan Note (Signed)
Without recurrent symptoms, cont asa and statin, recent echo and carotids neg, for MRI Dec 1 as planned, declines stroke clinic for now

## 2021-04-29 NOTE — Assessment & Plan Note (Signed)
BP Readings from Last 3 Encounters:  04/29/21 100/60  04/16/21 102/60  04/08/21 108/62   Possibly mild overtreated, pt asympt, and declines change in tx today, pt to continue medical treatment lotrel

## 2021-04-29 NOTE — Progress Notes (Signed)
Patient ID: Nicolas Allen, male   DOB: Feb 28, 1956, 65 y.o.   MRN: [id number]        Chief Complaint: follow up HTN, HLD and hyperglycemia, recent TIA       HPI:  Nicolas Allen is a 65 y.o. male here overall doing well - Pt denies chest pain, increased sob or doe, wheezing, orthopnea, PND, palpitations, dizziness or syncope, but does have some night time venous insufficiency swelling to the right leg in the evening, resolved with leg elevation and in the AM.     Pt denies polydipsia, polyuria, or new focal neuro s/s.  Good compliance with meds and tolerating well.  No overt bleeding or bruising.  Due for Shingles shot #2 today.  No other new complaints.   Recent Echo with normal EF, carotids neg for significant plaque, and MRI head pending for Dec 1.  Also has CT chest/abd/pelvis for Nov 21 with f/u with oncology after.  Wt overall stable, and in fact has picked up over 20 lbs in the past 2 yrs. BP remaiins low normal, and d/w pt but declines reduced antiHTN med for now.         Wt Readings from Last 3 Encounters:  04/29/21 164 lb 9.6 oz (74.7 kg)  04/16/21 167 lb 4 oz (75.9 kg)  04/08/21 165 lb 9.6 oz (75.1 kg)   BP Readings from Last 3 Encounters:  04/29/21 100/60  04/16/21 102/60  04/08/21 108/62         Past Medical History:  Diagnosis Date   Anemia 06/24/2012   ANXIETY 02/03/2007   Cancer ([place])    Cervical disc disease 02/12/2012   S/p surgury jan 2013   Erectile dysfunction 02/11/2011   GERD (gastroesophageal reflux disease)    GLUCOSE INTOLERANCE 01/04/2008   HYPERLIPIDEMIA 02/03/2007   HYPERTENSION 02/03/2007   IBS (irritable bowel syndrome)    Impaired glucose tolerance 02/11/2011   Smoker 08/22/2014   Substance abuse ([place]) 2001   sober for 16 yrs   Past Surgical History:  Procedure Laterality Date   BRONCHIAL BIOPSY  03/30/2020   Procedure: BRONCHIAL BIOPSIES;  Surgeon: Garner Nash, DO;  Location: [facility];  Service: Pulmonary;;   BRONCHIAL BRUSHINGS  03/30/2020    Procedure: BRONCHIAL BRUSHINGS;  Surgeon: Garner Nash, DO;  Location: [place];  Service: Pulmonary;;   BRONCHIAL NEEDLE ASPIRATION BIOPSY  03/30/2020   Procedure: BRONCHIAL NEEDLE ASPIRATION BIOPSIES;  Surgeon: Garner Nash, DO;  Location: [facility];  Service: Pulmonary;;   BRONCHIAL WASHINGS  03/30/2020   Procedure: BRONCHIAL WASHINGS;  Surgeon: Garner Nash, DO;  Location: [place];  Service: Pulmonary;;   COLONOSCOPY  2007   IR IMAGING GUIDED PORT INSERTION  04/23/2020   neck fusion  2012   C4   NO PAST SURGERIES     VIDEO BRONCHOSCOPY WITH ENDOBRONCHIAL NAVIGATION N/A 03/30/2020   Procedure: VIDEO BRONCHOSCOPY WITH ENDOBRONCHIAL NAVIGATION;  Surgeon: Garner Nash, DO;  Location: [place];  Service: Pulmonary;  Laterality: N/A;   VIDEO BRONCHOSCOPY WITH ENDOBRONCHIAL ULTRASOUND N/A 03/30/2020   Procedure: VIDEO BRONCHOSCOPY WITH ENDOBRONCHIAL ULTRASOUND;  Surgeon: Garner Nash, DO;  Location: [place];  Service: Pulmonary;  Laterality: N/A;    reports that he quit smoking about 14 months ago. His smoking use included cigarettes. He has a 45.00 pack-year smoking history. He has quit using smokeless tobacco.  His smokeless tobacco use included chew. He reports that he does not drink alcohol and does not use drugs.  family history includes Cancer in his cousin and mother; Hypertension in an other family member; Stroke in an other family member. No Known Allergies Current Outpatient Medications on File Prior to Visit  Medication Sig Dispense Refill   amLODipine-benazepril (LOTREL) 10-20 MG capsule TAKE 1 CAPSULE BY MOUTH ONCE DAILY 90 capsule 3   aspirin 325 MG EC tablet Take 1 tablet (325 mg total) by mouth daily. 90 tablet 99   b complex vitamins tablet Take 1 tablet by mouth daily.     cholecalciferol (VITAMIN D3) 25 MCG (1000 UNIT) tablet Take 1,000 Units by mouth daily.     Garlic 0973 MG CAPS Take 1,000 mg by mouth daily.      ibuprofen (ADVIL) 200  MG tablet Take 200 mg by mouth every 6 (six) hours as needed.     lidocaine-prilocaine (EMLA) cream Apply to the Port-A-Cath site 30-60 minutes before chemotherapy. 30 g 0   Omega-3 Fatty Acids (FISH OIL PO) Take 1 capsule by mouth daily.      polyethylene glycol (MIRALAX / GLYCOLAX) 17 g packet Take 17 g by mouth daily as needed.     prochlorperazine (COMPAZINE) 10 MG tablet Take 1 tablet (10 mg total) by mouth every 6 (six) hours as needed for nausea or vomiting. 30 tablet 0   rosuvastatin (CRESTOR) 20 MG tablet Take 1 tablet (20 mg total) by mouth daily. 90 tablet 3   vardenafil (LEVITRA) 20 MG tablet TAKE 1 TABLET BY MOUTH AS NEEDED FOR  ERECTILE  DYSFUNCTION 10 tablet 5   No current facility-administered medications on file prior to visit.        ROS:  All others reviewed and negative.  Objective        PE:  BP 100/60 (BP Location: Left Arm, Patient Position: Sitting, Cuff Size: Large)   Pulse 68   Temp 98.2 F (36.8 C) (Oral)   Ht 6' (1.829 m)   Wt 164 lb 9.6 oz (74.7 kg)   SpO2 100%   BMI 22.32 kg/m                 Constitutional: Pt appears in NAD               HENT: Head: NCAT.                Right Ear: External ear normal.                 Left Ear: External ear normal.                Eyes: . Pupils are equal, round, and reactive to light. Conjunctivae and EOM are normal               Nose: without d/c or deformity               Neck: Neck supple. Gross normal ROM               Cardiovascular: Normal rate and regular rhythm.                 Pulmonary/Chest: Effort normal and breath sounds without rales or wheezing.                Abd:  Soft, NT, ND, + BS, no organomegaly               Neurological: Pt is alert. At baseline orientation, motor grossly intact  Skin: Skin is warm. No rashes, no other new lesions, LE edema - none               Psychiatric: Pt behavior is normal without agitation   Micro: none  Cardiac tracings I have personally interpreted  today:  none  Pertinent Radiological findings (summarize): none   Lab Results  Component Value Date   WBC 5.3 04/16/2021   HGB 11.2 (L) 04/16/2021   HCT 33.5 (L) 04/16/2021   PLT 149 (L) 04/16/2021   GLUCOSE 164 (H) 04/16/2021   CHOL 187 02/22/2021   TRIG 57.0 02/22/2021   HDL 67.90 02/22/2021   LDLCALC 108 (H) 02/22/2021   ALT 57 (H) 04/16/2021   AST 75 (H) 04/16/2021   NA 137 04/16/2021   K 3.7 04/16/2021   CL 104 04/16/2021   CREATININE 1.10 04/16/2021   BUN 15 04/16/2021   CO2 26 04/16/2021   TSH 1.256 04/16/2021   PSA 0.70 02/22/2021   INR 1.0 03/30/2020   HGBA1C 5.7 02/22/2021   Assessment/Plan:  Nicolas Allen is a 65 y.o. Black or African American [2] male with  has a past medical history of Anemia (06/24/2012), ANXIETY (02/03/2007), Cancer ([place]), Cervical disc disease (02/12/2012), Erectile dysfunction (02/11/2011), GERD (gastroesophageal reflux disease), GLUCOSE INTOLERANCE (01/04/2008), HYPERLIPIDEMIA (02/03/2007), HYPERTENSION (02/03/2007), IBS (irritable bowel syndrome), Impaired glucose tolerance (02/11/2011), Smoker (08/22/2014), and Substance abuse ([place]) (2001).  Hyperlipidemia Lab Results  Component Value Date   LDLCALC 108 (H) 02/22/2021   Uncontrolled, goal ldl < 70, pt to continue current statin crestor 20, declines other increase for now, for lower chol diet, and f/u lab next visit   Essential hypertension BP Readings from Last 3 Encounters:  04/29/21 100/60  04/16/21 102/60  04/08/21 108/62   Possibly mild overtreated, pt asympt, and declines change in tx today, pt to continue medical treatment lotrel   Impaired glucose tolerance Lab Results  Component Value Date   HGBA1C 5.7 02/22/2021   Stable, pt to continue current medical treatment  - diet   TIA (transient ischemic attack) Without recurrent symptoms, cont asa and statin, recent echo and carotids neg, for MRI Dec 1 as planned, declines stroke clinic for now  Followup: Return in about 6 months  (around 10/27/2021).  Cathlean Cower, MD 04/29/2021 9:00 AM [facility]

## 2021-04-30 NOTE — Progress Notes (Signed)
[facility] OFFICE PROGRESS NOTE  [name], MD [place] [address]  DIAGNOSIS: Stage IV (T3, N2, M1c) non-small cell lung cancer favoring adenocarcinoma presented with right upper lobe lung mass in addition to right lower lobe, left lower lobe in addition to right hilar and mediastinal lymphadenopathy in addition to metastatic brain lesions diagnosed in October 2021.   Molecular studies by [employer]:   [id number], 9.5%,   PRIOR THERAPY: SRS to 3 brain lesion under the care of Dr. Lisbeth Renshaw.  Completed on April 20, 2020  CURRENT THERAPY: Systemic chemotherapy with carboplatin for AUC of 5, Alimta 500 mg/M2 and Keytruda 200 mg IV every 3 weeks.  First dose April 24, 2020.  Status post 18 cycles.  Starting from cycle #5 the patient will be on maintenance treatment with Alimta and Keytruda every 3 weeks.    INTERVAL HISTORY: Nicolas Allen 65 y.o. male returns to the clinic today for a follow-up visit. The patient is feeling well today without any concerning complaints today except he had 1 episode of diarrhea yesterday from drinking miralax on Sunday and drinking the oral contrast from his scan yesterday. Denies blood in the stool of abdominal pain. He tolerated his last cycle of treatment well without any concerning adverse side effects.  He denies any fever, chills, night sweats, or weight loss.  He denies any nausea, vomiting, or constipation.  He denies any chest pain, shortness of breath, or hemoptysis.  Denise any major cough. Denies headaches or visual changes. He is scheduled for repeat brain MRI next week on 05/16/2021.  He has some hypopigmentation on the skin on the left side of his face which may be treatment related. The patient recently had a restaging CT scan performed. He is here today for evaluation and to review his scan results before starting cycle #19.   MEDICAL HISTORY: Past Medical History:  Diagnosis Date   Anemia 06/24/2012    ANXIETY 02/03/2007   Cancer ([place])    Cervical disc disease 02/12/2012   S/p surgury jan 2013   Erectile dysfunction 02/11/2011   GERD (gastroesophageal reflux disease)    GLUCOSE INTOLERANCE 01/04/2008   HYPERLIPIDEMIA 02/03/2007   HYPERTENSION 02/03/2007   IBS (irritable bowel syndrome)    Impaired glucose tolerance 02/11/2011   Smoker 08/22/2014   Substance abuse ([place]) 2001   sober for 16 yrs    ALLERGIES:  has No Known Allergies.  MEDICATIONS:  Current Outpatient Medications  Medication Sig Dispense Refill   amLODipine-benazepril (LOTREL) 10-20 MG capsule TAKE 1 CAPSULE BY MOUTH ONCE DAILY 90 capsule 3   aspirin 325 MG EC tablet Take 1 tablet (325 mg total) by mouth daily. 90 tablet 99   b complex vitamins tablet Take 1 tablet by mouth daily.     cholecalciferol (VITAMIN D3) 25 MCG (1000 UNIT) tablet Take 1,000 Units by mouth daily.     folic acid (FOLVITE) 1 MG tablet Take 1 tablet by mouth once daily 30 tablet 0   folic acid (FOLVITE) 1 MG tablet Take 1 tablet (1 mg total) by mouth daily. 90 tablet 3   Garlic 3710 MG CAPS Take 1,000 mg by mouth daily.      ibuprofen (ADVIL) 200 MG tablet Take 200 mg by mouth every 6 (six) hours as needed.     lidocaine-prilocaine (EMLA) cream Apply to the Port-A-Cath site 30-60 minutes before chemotherapy. 30 g 0   Omega-3 Fatty Acids (FISH OIL PO) Take 1  capsule by mouth daily.      polyethylene glycol (MIRALAX / GLYCOLAX) 17 g packet Take 17 g by mouth daily as needed.     prochlorperazine (COMPAZINE) 10 MG tablet Take 1 tablet (10 mg total) by mouth every 6 (six) hours as needed for nausea or vomiting. 30 tablet 0   rosuvastatin (CRESTOR) 20 MG tablet Take 1 tablet (20 mg total) by mouth daily. 90 tablet 3   vardenafil (LEVITRA) 20 MG tablet TAKE 1 TABLET BY MOUTH AS NEEDED FOR  ERECTILE  DYSFUNCTION 10 tablet 5   No current facility-administered medications for this visit.    SURGICAL HISTORY:  Past Surgical History:  Procedure Laterality  Date   BRONCHIAL BIOPSY  03/30/2020   Procedure: BRONCHIAL BIOPSIES;  Surgeon: Garner Nash, DO;  Location: [facility];  Service: Pulmonary;;   BRONCHIAL BRUSHINGS  03/30/2020   Procedure: BRONCHIAL BRUSHINGS;  Surgeon: Garner Nash, DO;  Location: [facility];  Service: Pulmonary;;   BRONCHIAL NEEDLE ASPIRATION BIOPSY  03/30/2020   Procedure: BRONCHIAL NEEDLE ASPIRATION BIOPSIES;  Surgeon: Garner Nash, DO;  Location: [facility];  Service: Pulmonary;;   BRONCHIAL WASHINGS  03/30/2020   Procedure: BRONCHIAL WASHINGS;  Surgeon: Garner Nash, DO;  Location: [facility];  Service: Pulmonary;;   COLONOSCOPY  2007   IR IMAGING GUIDED PORT INSERTION  04/23/2020   neck fusion  2012   C4   NO PAST SURGERIES     VIDEO BRONCHOSCOPY WITH ENDOBRONCHIAL NAVIGATION N/A 03/30/2020   Procedure: VIDEO BRONCHOSCOPY WITH ENDOBRONCHIAL NAVIGATION;  Surgeon: Garner Nash, DO;  Location: [facility];  Service: Pulmonary;  Laterality: N/A;   VIDEO BRONCHOSCOPY WITH ENDOBRONCHIAL ULTRASOUND N/A 03/30/2020   Procedure: VIDEO BRONCHOSCOPY WITH ENDOBRONCHIAL ULTRASOUND;  Surgeon: Garner Nash, DO;  Location: [facility];  Service: Pulmonary;  Laterality: N/A;    REVIEW OF SYSTEMS:   Review of Systems  Constitutional: Negative for appetite change, chills, fatigue, fever and unexpected weight change.  HENT: Negative for mouth sores, nosebleeds, sore throat and trouble swallowing.   Eyes: Negative for eye problems and icterus.  Respiratory: Negative for cough, hemoptysis, shortness of breath and wheezing.   Cardiovascular: Negative for chest pain and leg swelling.  Gastrointestinal: Negative for abdominal pain, constipation, diarrhea (x1 yesterday, resolved), nausea and vomiting.  Genitourinary: Negative for bladder incontinence, difficulty urinating, dysuria, frequency and hematuria.   Musculoskeletal: Negative for back pain, gait problem, neck pain and neck stiffness.  Skin: Negative  for itching and rash.  Neurological: Negative for dizziness, extremity weakness, gait problem, headaches, light-headedness and seizures.  Hematological: Negative for adenopathy. Does not bruise/bleed easily.  Psychiatric/Behavioral: Negative for confusion, depression and sleep disturbance. The patient is not nervous/anxious.     PHYSICAL EXAMINATION:  Blood pressure 101/60, pulse 68, temperature (!) 97.2 F (36.2 C), resp. rate 18, weight 165 lb 4.8 oz (75 kg), SpO2 100 %.  ECOG PERFORMANCE STATUS: 1  Physical Exam  Constitutional: Oriented to person, place, and time and thin appearing male, and in no distress.  HENT:  Head: Normocephalic and atraumatic.  Mouth/Throat: Oropharynx is clear and moist. No oropharyngeal exudate.  Eyes: Conjunctivae are normal. Right eye exhibits no discharge. Left eye exhibits no discharge. No scleral icterus.  Neck: Normal range of motion. Neck supple.  Cardiovascular: Normal rate, regular rhythm, normal heart sounds and intact distal pulses.   Pulmonary/Chest: Effort normal and breath sounds normal. No respiratory distress. No wheezes. No rales.  Abdominal: Soft. Bowel sounds are normal. Exhibits  no distension and no mass. There is no tenderness.  Musculoskeletal: Normal range of motion. Exhibits no edema.  Lymphadenopathy:    No cervical adenopathy.  Neurological: Alert and oriented to person, place, and time. Exhibits normal muscle tone. Gait normal. Coordination normal.  Skin: Positive for hypopigmentation/patches on face only. Skin is warm and dry. Not diaphoretic. No erythema. No pallor.  Psychiatric: Mood, memory and judgment normal.  Vitals reviewed.  LABORATORY DATA: Lab Results  Component Value Date   WBC 5.5 05/07/2021   HGB 10.6 (L) 05/07/2021   HCT 31.1 (L) 05/07/2021   MCV 83.4 05/07/2021   PLT 184 05/07/2021      Chemistry      Component Value Date/Time   NA 139 05/07/2021 0901   K 4.1 05/07/2021 0901   CL 105 05/07/2021 0901    CO2 25 05/07/2021 0901   BUN 11 05/07/2021 0901   CREATININE 1.01 05/07/2021 0901   CREATININE 0.88 02/21/2020 1115      Component Value Date/Time   CALCIUM 8.7 (L) 05/07/2021 0901   ALKPHOS 54 05/07/2021 0901   AST 34 05/07/2021 0901   ALT 32 05/07/2021 0901   BILITOT 0.3 05/07/2021 0901       RADIOGRAPHIC STUDIES:  CT Chest W Contrast  Result Date: 05/07/2021 CLINICAL DATA:  Non-small cell lung cancer restaging EXAM: CT CHEST, ABDOMEN, AND PELVIS WITH CONTRAST TECHNIQUE: Multidetector CT imaging of the chest, abdomen and pelvis was performed following the standard protocol during bolus administration of intravenous contrast. CONTRAST:  20mL OMNIPAQUE IOHEXOL 350 MG/ML SOLN, additional oral enteric contrast COMPARISON:  03/01/2021 FINDINGS: CT CHEST FINDINGS Cardiovascular: Right chest port catheter. Normal heart size. No pericardial effusion. Mediastinum/Nodes: No enlarged mediastinal, hilar, or axillary lymph nodes. Thyroid gland, trachea, and esophagus demonstrate no significant findings. Lungs/Pleura: Unchanged mild centrilobular emphysema. Unchanged post treatment appearance of a mass of the posterior right upper lobe, measuring 3.7 by 1.1 cm (series 4, image 66). Numerous additional sub solid, ground-glass, and mixed solid and cystic nodules throughout the lungs are unchanged, for example in the posterior right apex a 1.6 x 1.0 cm subsolid nodule (series 4, image 30), a predominantly ground-glass lesion of the inferior right lower lobe measuring 2.8 x 2.1 cm (series 4, image 83), and a mixed solid and cystic lesion of the dependent left lower lobe measuring 3.2 x 2.7 cm (series 4, image 113). No pleural effusion or pneumothorax. Musculoskeletal: No chest wall mass or suspicious bone lesions identified. CT ABDOMEN PELVIS FINDINGS Hepatobiliary: No solid liver abnormality is seen. No gallstones, gallbladder wall thickening, or biliary dilatation. Pancreas: Unremarkable. No pancreatic  ductal dilatation or surrounding inflammatory changes. Spleen: Normal in size without significant abnormality. Adrenals/Urinary Tract: Adrenal glands are unremarkable. Kidneys are normal, without renal calculi, solid lesion, or hydronephrosis. Bladder is unremarkable. Stomach/Bowel: Stomach is within normal limits. Appendix appears normal. There is a focally thickened loop of distal small bowel in the left hemipelvis (series 2, image 104). Vascular/Lymphatic: Aortic atherosclerosis. No enlarged abdominal or pelvic lymph nodes. Reproductive: No mass or other abnormality. Other: No abdominal wall hernia or abnormality. No abdominopelvic ascites. Musculoskeletal: No acute or significant osseous findings. IMPRESSION: 1. Unchanged post treatment appearance of a mass of the posterior right upper lobe. 2. Numerous additional subsolid, ground-glass, and mixed solid and cystic nodules throughout the lungs are unchanged, and remain in general suspicious for multifocal adenocarcinoma or metastatic disease. 3. No evidence of distant metastatic disease in the abdomen or pelvis. 4. There is a focally  thickened loop of distal small bowel in the left hemipelvis, consistent with nonspecific infectious, inflammatory, or ischemic enteritis. Attention on follow-up. 5. Emphysema. Aortic Atherosclerosis (ICD10-I70.0) and Emphysema (ICD10-J43.9). Electronically Signed   By: Delanna Ahmadi M.D.   On: 05/07/2021 08:19   CT Abdomen Pelvis W Contrast  Result Date: 05/07/2021 CLINICAL DATA:  Non-small cell lung cancer restaging EXAM: CT CHEST, ABDOMEN, AND PELVIS WITH CONTRAST TECHNIQUE: Multidetector CT imaging of the chest, abdomen and pelvis was performed following the standard protocol during bolus administration of intravenous contrast. CONTRAST:  76mL OMNIPAQUE IOHEXOL 350 MG/ML SOLN, additional oral enteric contrast COMPARISON:  03/01/2021 FINDINGS: CT CHEST FINDINGS Cardiovascular: Right chest port catheter. Normal heart size. No  pericardial effusion. Mediastinum/Nodes: No enlarged mediastinal, hilar, or axillary lymph nodes. Thyroid gland, trachea, and esophagus demonstrate no significant findings. Lungs/Pleura: Unchanged mild centrilobular emphysema. Unchanged post treatment appearance of a mass of the posterior right upper lobe, measuring 3.7 by 1.1 cm (series 4, image 66). Numerous additional sub solid, ground-glass, and mixed solid and cystic nodules throughout the lungs are unchanged, for example in the posterior right apex a 1.6 x 1.0 cm subsolid nodule (series 4, image 30), a predominantly ground-glass lesion of the inferior right lower lobe measuring 2.8 x 2.1 cm (series 4, image 83), and a mixed solid and cystic lesion of the dependent left lower lobe measuring 3.2 x 2.7 cm (series 4, image 113). No pleural effusion or pneumothorax. Musculoskeletal: No chest wall mass or suspicious bone lesions identified. CT ABDOMEN PELVIS FINDINGS Hepatobiliary: No solid liver abnormality is seen. No gallstones, gallbladder wall thickening, or biliary dilatation. Pancreas: Unremarkable. No pancreatic ductal dilatation or surrounding inflammatory changes. Spleen: Normal in size without significant abnormality. Adrenals/Urinary Tract: Adrenal glands are unremarkable. Kidneys are normal, without renal calculi, solid lesion, or hydronephrosis. Bladder is unremarkable. Stomach/Bowel: Stomach is within normal limits. Appendix appears normal. There is a focally thickened loop of distal small bowel in the left hemipelvis (series 2, image 104). Vascular/Lymphatic: Aortic atherosclerosis. No enlarged abdominal or pelvic lymph nodes. Reproductive: No mass or other abnormality. Other: No abdominal wall hernia or abnormality. No abdominopelvic ascites. Musculoskeletal: No acute or significant osseous findings. IMPRESSION: 1. Unchanged post treatment appearance of a mass of the posterior right upper lobe. 2. Numerous additional subsolid, ground-glass, and  mixed solid and cystic nodules throughout the lungs are unchanged, and remain in general suspicious for multifocal adenocarcinoma or metastatic disease. 3. No evidence of distant metastatic disease in the abdomen or pelvis. 4. There is a focally thickened loop of distal small bowel in the left hemipelvis, consistent with nonspecific infectious, inflammatory, or ischemic enteritis. Attention on follow-up. 5. Emphysema. Aortic Atherosclerosis (ICD10-I70.0) and Emphysema (ICD10-J43.9). Electronically Signed   By: Delanna Ahmadi M.D.   On: 05/07/2021 08:19   ECHOCARDIOGRAM COMPLETE  Result Date: 04/24/2021    ECHOCARDIOGRAM REPORT   Patient Name:   BALTAZAR PEKALA Date of Exam: 04/24/2021 Medical Rec #:  [id number]       Height:       72.0 in Accession #:    [id number]      Weight:       167.2 lb Date of Birth:  [birth year]        BSA:          1.975 m Patient Age:    57 years        BP:           102/60 mmHg Patient Gender: M  HR:           62 bpm. Exam Location:  [facility] Procedure: 2D Echo, Cardiac Doppler and Color Doppler Indications:    G45.9 TIA  History:        Patient has no prior history of Echocardiogram examinations.                 TIA; Risk Factors:Hypertension, Dyslipidemia and Current Smoker.  Sonographer:    Coralyn Helling RDCS Referring Phys: [place]  1. Left ventricular ejection fraction, by estimation, is 60 to 65%. The left ventricle has normal function. The left ventricle has no regional wall motion abnormalities. Left ventricular diastolic parameters are consistent with Grade I diastolic dysfunction (impaired relaxation).  2. Right ventricular systolic function is normal. The right ventricular size is normal. There is normal pulmonary artery systolic pressure. The estimated right ventricular systolic pressure is 90.2 mmHg.  3. The mitral valve is normal in structure. No evidence of mitral valve regurgitation. No evidence of mitral stenosis.  4. Aortic valve  calcification mostly localized in non coronary cusp. The aortic valve is tricuspid. There is mild calcification of the aortic valve. There is mild thickening of the aortic valve. Aortic valve regurgitation is not visualized. No aortic stenosis is present.  5. The inferior vena cava is normal in size with greater than 50% respiratory variability, suggesting right atrial pressure of 3 mmHg. Comparison(s): No prior Echocardiogram. Conclusion(s)/Recommendation(s): No intracardiac source of embolism detected on this transthoracic study. A transesophageal echocardiogram is recommended to exclude cardiac source of embolism if clinically indicated. FINDINGS  Left Ventricle: Left ventricular ejection fraction, by estimation, is 60 to 65%. The left ventricle has normal function. The left ventricle has no regional wall motion abnormalities. The left ventricular internal cavity size was normal in size. There is  no left ventricular hypertrophy. Left ventricular diastolic parameters are consistent with Grade I diastolic dysfunction (impaired relaxation). Right Ventricle: The right ventricular size is normal. No increase in right ventricular wall thickness. Right ventricular systolic function is normal. There is normal pulmonary artery systolic pressure. The tricuspid regurgitant velocity is 2.66 m/s, and  with an assumed right atrial pressure of 3 mmHg, the estimated right ventricular systolic pressure is 40.9 mmHg. Left Atrium: Left atrial size was normal in size. Right Atrium: Right atrial size was normal in size. Pericardium: There is no evidence of pericardial effusion. Mitral Valve: The mitral valve is normal in structure. No evidence of mitral valve regurgitation. No evidence of mitral valve stenosis. Tricuspid Valve: The tricuspid valve is normal in structure. Tricuspid valve regurgitation is mild . No evidence of tricuspid stenosis. Aortic Valve: Aortic valve calcification mostly localized in non coronary cusp. The  aortic valve is tricuspid. There is mild calcification of the aortic valve. There is mild thickening of the aortic valve. Aortic valve regurgitation is not visualized. No  aortic stenosis is present. Pulmonic Valve: The pulmonic valve was normal in structure. Pulmonic valve regurgitation is not visualized. No evidence of pulmonic stenosis. Aorta: The aortic root is normal in size and structure. Venous: The inferior vena cava is normal in size with greater than 50% respiratory variability, suggesting right atrial pressure of 3 mmHg. IAS/Shunts: No atrial level shunt detected by color flow Doppler.  LEFT VENTRICLE PLAX 2D LVIDd:         4.30 cm Diastology LVIDs:         2.70 cm LV e' medial:    10.70 cm/s LV PW:  0.90 cm LV E/e' medial:  7.3 LV IVS:        1.10 cm LV e' lateral:   13.60 cm/s                        LV E/e' lateral: 5.7  RIGHT VENTRICLE             IVC RV S prime:     19.50 cm/s  IVC diam: 1.20 cm TAPSE (M-mode): 2.1 cm RVSP:           31.3 mmHg LEFT ATRIUM             Index        RIGHT ATRIUM           Index LA diam:        3.70 cm 1.87 cm/m   RA Pressure: 3.00 mmHg LA Vol (A2C):   40.8 ml 20.66 ml/m  RA Area:     13.40 cm LA Vol (A4C):   33.6 ml 17.02 ml/m  RA Volume:   34.00 ml  17.22 ml/m LA Biplane Vol: 36.9 ml 18.69 ml/m  AORTIC VALVE LVOT Vmax:   91.00 cm/s LVOT Vmean:  55.000 cm/s LVOT VTI:    0.179 m  AORTA Ao Asc diam: 3.00 cm MITRAL VALVE               TRICUSPID VALVE MV Area (PHT): 2.38 cm    TR Peak grad:   28.3 mmHg MV Decel Time: 319 msec    TR Vmax:        266.00 cm/s MV E velocity: 77.60 cm/s  Estimated RAP:  3.00 mmHg MV A velocity: 85.90 cm/s  RVSP:           31.3 mmHg MV E/A ratio:  0.90                            SHUNTS                            Systemic VTI: 0.18 m Candee Furbish MD Electronically signed by Candee Furbish MD Signature Date/Time: 04/24/2021/11:07:05 AM    Final    VAS US CAROTID  Result Date: 04/17/2021 Carotid Arterial Duplex Study Patient Name:   GRAIDEN HENES  Date of Exam:   04/17/2021 Medical Rec #: [id number]        Accession #:    [id number] Date of Birth: 1956/06/06         Patient Gender: M Patient Age:   43 years Exam Location:  [facility] Procedure:      VAS US CAROTID Referring Phys: Cathlean Cower --------------------------------------------------------------------------------  Indications:  Numbness. Patient reports an episdoe of numbness of his left arm               two saturdays ago. It lasted a couple minutes and the arm went               completely numb. No recurrence since. Evaluate for carotid               disease. Risk Factors: Hypertension, hyperlipidemia, past history of smoking. Performing Technologist: Mariane Masters RVT  Examination Guidelines: A complete evaluation includes B-mode imaging, spectral Doppler, color Doppler, and power Doppler as needed of all accessible portions of each vessel. Bilateral testing is considered an integral part of a complete examination. Limited examinations  for reoccurring indications may be performed as noted.  Right Carotid Findings: +----------+--------+--------+--------+------------------+--------+           PSV cm/sEDV cm/sStenosisPlaque DescriptionComments +----------+--------+--------+--------+------------------+--------+ CCA Prox  88      13                                         +----------+--------+--------+--------+------------------+--------+ CCA Distal52      10                                         +----------+--------+--------+--------+------------------+--------+ ICA Prox  40      14              smooth                     +----------+--------+--------+--------+------------------+--------+ ICA Mid   50      18      1-39%                              +----------+--------+--------+--------+------------------+--------+ ICA Distal50      17                                         +----------+--------+--------+--------+------------------+--------+  ECA       93      11                                         +----------+--------+--------+--------+------------------+--------+ +----------+--------+-------+----------------+-------------------+           PSV cm/sEDV cmsDescribe        Arm Pressure (mmHG) +----------+--------+-------+----------------+-------------------+ [id number]             Multiphasic, [id number]                 +----------+--------+-------+----------------+-------------------+ +---------+--------+--+--------+-+---------+ VertebralPSV cm/s35EDV cm/s9Antegrade +---------+--------+--+--------+-+---------+  Left Carotid Findings: +----------+--------+--------+--------+------------------+--------+           PSV cm/sEDV cm/sStenosisPlaque DescriptionComments +----------+--------+--------+--------+------------------+--------+ CCA Prox  98      16                                         +----------+--------+--------+--------+------------------+--------+ CCA Distal54      14                                         +----------+--------+--------+--------+------------------+--------+ ICA Prox  43      11              hyperechoic                +----------+--------+--------+--------+------------------+--------+ ICA Mid   65      22      1-39%                              +----------+--------+--------+--------+------------------+--------+ ICA Distal70  23                                         +----------+--------+--------+--------+------------------+--------+ ECA       95      10                                         +----------+--------+--------+--------+------------------+--------+ +----------+--------+--------+----------------+-------------------+           PSV cm/sEDV cm/sDescribe        Arm Pressure (mmHG) +----------+--------+--------+----------------+-------------------+ [id number]              Multiphasic, [id number]                  +----------+--------+--------+----------------+-------------------+ +---------+--------+--+--------+-+---------+ VertebralPSV cm/s43EDV cm/s9Antegrade +---------+--------+--+--------+-+---------+   Summary: Right Carotid: Velocities in the right ICA are consistent with a 1-39% stenosis. Left Carotid: Velocities in the left ICA are consistent with a 1-39% stenosis. Vertebrals:  Bilateral vertebral arteries demonstrate antegrade flow. Subclavians: Normal flow hemodynamics were seen in bilateral subclavian              arteries. *See table(s) above for measurements and observations.  Electronically signed by Jenkins Rouge MD on 04/17/2021 at 3:59:32 PM.    Final      ASSESSMENT/PLAN:  This is a very pleasant 65 year old African-American male diagnosed with stage IV (T3, N2, M1C) non-small cell lung cancer, adenocarcinoma.  He presented with a right upper lobe lung mass in addition to a right lower lobe, left lower lobe lung lesions with right hilar and mediastinal lymphadenopathy.  He also had metastatic disease to the brain.  He was diagnosed in October 2022.  His molecular studies by [employer] did not show any actionable mutations.  The patient underwent SRS treatment to the metastatic brain lesions under the care of Dr. Lisbeth Renshaw.  This was completed on 04/20/2021.  The patient is currently undergoing systemic chemotherapy/immunotherapy.  He started with carboplatin for an AUC of 5, Alimta 500 mg per metered squared, Keytruda 200 mg IV every 3 weeks.  He is status post 18 cycles.  Starting from cycle #5, the patient has been on maintenance treatment with Alimta and Keytruda IV every 3 weeks.   The patient recently had a restaging CT scan performed.  Dr. Julien Nordmann personally and independently reviewed the scan and discussed the results with the patient today.  The scan showed no evidence for disease progression.  Recommend that he proceed with cycle #19 today scheduled.  We will see him back for  follow-up visit in 3 weeks for evaluation before starting cycle #20.  He is scheduled for a repeat brain MRI on 05/16/2021 due to his history of metastatic disease to the brain.  The patient was advised to call immediately if he has any concerning symptoms in the interval. The patient voices understanding of current disease status and treatment options and is in agreement with the current care plan. All questions were answered. The patient knows to call the clinic with any problems, questions or concerns. We can certainly see the patient much sooner if necessary     Orders Placed This Encounter  Procedures   TSH    Standing Status:   Standing    Number of Occurrences:   15    Standing Expiration Date:   05/07/2022  Sritha Chauncey L Alejah Aristizabal, PA-C 05/07/21  ADDENDUM: Hematology/Oncology Attending: I had a face-to-face encounter with the patient today.  I reviewed his record, lab and scan and recommended his care plan this is a very pleasant 65 years old African-American male with a stage IV (T3, N2, M1 C) non-small cell lung cancer, adenocarcinoma presented with right upper lobe lung mass in addition to right lower lobe and left lower lobe lung lesion as well as right hilar and mediastinal lymphadenopathy and metastatic disease to the brain diagnosed in October 2021 status post SRS to 3 brain lesions completed November 2021.  The patient has no actionable mutations. He started systemic chemotherapy with carboplatin, Alimta and Keytruda for 4 cycles and currently on maintenance treatment with Alimta and Keytruda every 3 weeks status post total of 18 cycles of treatment.  The patient has been tolerating his treatment fairly well with no concerning adverse effects. He had repeat CT scan of the chest, abdomen pelvis performed recently.  I personally and independently reviewed the scan images and discussed the results with the patient today.  Has a scan showed no concerning findings for  disease progression. I recommended for the patient to continue his current treatment with maintenance Alimta and Keytruda and he will proceed with cycle #19 today. He will come back for follow-up visit in 3 weeks for evaluation before the next cycle of his treatment. The patient was advised to call immediately if he has any other concerning symptoms in the interval.  The total time spent in the appointment was 30 minutes. Disclaimer: This note was dictated with voice recognition software. Similar sounding words can inadvertently be transcribed and may be missed upon review. Eilleen Kempf, MD 05/07/21

## 2021-05-06 ENCOUNTER — Encounter (HOSPITAL_COMMUNITY): Payer: Self-pay

## 2021-05-06 ENCOUNTER — Other Ambulatory Visit: Payer: Self-pay

## 2021-05-06 ENCOUNTER — Ambulatory Visit (HOSPITAL_COMMUNITY)
Admission: RE | Admit: 2021-05-06 | Discharge: 2021-05-06 | Disposition: A | Discharge status: Home / Self Care | Payer: 59 | Source: Ambulatory Visit | Attending: Internal Medicine | Admitting: Internal Medicine

## 2021-05-06 DIAGNOSIS — C3490 Malignant neoplasm of unspecified part of unspecified bronchus or lung: Secondary | ICD-10-CM | POA: Insufficient documentation

## 2021-05-06 MED ORDER — HEPARIN SOD (PORK) LOCK FLUSH 100 UNIT/ML IV SOLN
500.0000 [IU] | Freq: Once | INTRAVENOUS | Status: AC
Start: 2021-05-06 — End: 2021-05-06
  Administered 2021-05-06: 500 [IU] via INTRAVENOUS

## 2021-05-06 MED ORDER — IOHEXOL 350 MG/ML SOLN
80.0000 mL | Freq: Once | INTRAVENOUS | Status: AC | PRN
  Administered 2021-05-06: 80 mL via INTRAVENOUS

## 2021-05-06 MED ORDER — HEPARIN SOD (PORK) LOCK FLUSH 100 UNIT/ML IV SOLN
INTRAVENOUS | Status: AC
  Filled 2021-05-06: qty 5

## 2021-05-06 MED ORDER — SODIUM CHLORIDE (PF) 0.9 % IJ SOLN
INTRAMUSCULAR | Status: AC
  Filled 2021-05-06: qty 50

## 2021-05-07 ENCOUNTER — Encounter: Payer: Self-pay | Admitting: Physician Assistant

## 2021-05-07 ENCOUNTER — Inpatient Hospital Stay: Payer: 59

## 2021-05-07 ENCOUNTER — Inpatient Hospital Stay: Payer: 59 | Admitting: Physician Assistant

## 2021-05-07 VITALS — BP 101/60 | HR 68 | Temp 97.2°F | Resp 18 | Wt 165.3 lb

## 2021-05-07 DIAGNOSIS — C3491 Malignant neoplasm of unspecified part of right bronchus or lung: Secondary | ICD-10-CM

## 2021-05-07 DIAGNOSIS — Z5112 Encounter for antineoplastic immunotherapy: Secondary | ICD-10-CM

## 2021-05-07 DIAGNOSIS — Z95828 Presence of other vascular implants and grafts: Secondary | ICD-10-CM

## 2021-05-07 LAB — CBC WITH DIFFERENTIAL (CANCER CENTER ONLY)
Abs Immature Granulocytes: 0.01 10*3/uL (ref 0.00–0.07)
Basophils Absolute: 0 10*3/uL (ref 0.0–0.1)
Basophils Relative: 0 %
Eosinophils Absolute: 0.2 10*3/uL (ref 0.0–0.5)
Eosinophils Relative: 3 %
HCT: 31.1 % — ABNORMAL LOW (ref 39.0–52.0)
Hemoglobin: 10.6 g/dL — ABNORMAL LOW (ref 13.0–17.0)
Immature Granulocytes: 0 %
Lymphocytes Relative: 41 %
Lymphs Abs: 2.2 10*3/uL (ref 0.7–4.0)
MCH: 28.4 pg (ref 26.0–34.0)
MCHC: 34.1 g/dL (ref 30.0–36.0)
MCV: 83.4 fL (ref 80.0–100.0)
Monocytes Absolute: 0.6 10*3/uL (ref 0.1–1.0)
Monocytes Relative: 10 %
Neutro Abs: 2.5 10*3/uL (ref 1.7–7.7)
Neutrophils Relative %: 46 %
Platelet Count: 184 10*3/uL (ref 150–400)
RBC: 3.73 MIL/uL — ABNORMAL LOW (ref 4.22–5.81)
RDW: 15.7 % — ABNORMAL HIGH (ref 11.5–15.5)
WBC Count: 5.5 10*3/uL (ref 4.0–10.5)
nRBC: 0 % (ref 0.0–0.2)

## 2021-05-07 LAB — CMP (CANCER CENTER ONLY)
ALT: 32 U/L (ref 0–44)
AST: 34 U/L (ref 15–41)
Albumin: 3.4 g/dL — ABNORMAL LOW (ref 3.5–5.0)
Alkaline Phosphatase: 54 U/L (ref 38–126)
Anion gap: 9 (ref 5–15)
BUN: 11 mg/dL (ref 8–23)
CO2: 25 mmol/L (ref 22–32)
Calcium: 8.7 mg/dL — ABNORMAL LOW (ref 8.9–10.3)
Chloride: 105 mmol/L (ref 98–111)
Creatinine: 1.01 mg/dL (ref 0.61–1.24)
GFR, Estimated: >60 mL/min (ref >60)
Glucose, Bld: 86 mg/dL (ref 70–99)
Potassium: 4.1 mmol/L (ref 3.5–5.1)
Sodium: 139 mmol/L (ref 135–145)
Total Bilirubin: 0.3 mg/dL (ref 0.3–1.2)
Total Protein: 7.2 g/dL (ref 6.5–8.1)

## 2021-05-07 MED ORDER — HEPARIN SOD (PORK) LOCK FLUSH 100 UNIT/ML IV SOLN
500.0000 [IU] | Freq: Once | INTRAVENOUS | Status: AC | PRN
  Administered 2021-05-07: 500 [IU]

## 2021-05-07 MED ORDER — SODIUM CHLORIDE 0.9% FLUSH
10.0000 mL | INTRAVENOUS | Status: DC | PRN
  Administered 2021-05-07: 10 mL

## 2021-05-07 MED ORDER — SODIUM CHLORIDE 0.9 % IV SOLN
500.0000 mg/m2 | Freq: Once | INTRAVENOUS | Status: AC
  Administered 2021-05-07: 900 mg via INTRAVENOUS
  Filled 2021-05-07: qty 20

## 2021-05-07 MED ORDER — PROCHLORPERAZINE MALEATE 10 MG PO TABS
10.0000 mg | ORAL_TABLET | Freq: Once | ORAL | Status: AC
  Administered 2021-05-07: 10 mg via ORAL
  Filled 2021-05-07: qty 1

## 2021-05-07 MED ORDER — SODIUM CHLORIDE 0.9 % IV SOLN
200.0000 mg | Freq: Once | INTRAVENOUS | Status: AC
  Administered 2021-05-07: 200 mg via INTRAVENOUS
  Filled 2021-05-07: qty 8

## 2021-05-07 MED ORDER — SODIUM CHLORIDE 0.9% FLUSH
10.0000 mL | Freq: Once | INTRAVENOUS | Status: AC
  Administered 2021-05-07: 10 mL

## 2021-05-07 MED ORDER — SODIUM CHLORIDE 0.9 % IV SOLN: Freq: Once | INTRAVENOUS | Status: AC

## 2021-05-07 NOTE — Patient Instructions (Signed)
[facility]  Discharge Instructions: Thank you for choosing [facility] to provide your oncology and hematology care.   If you have a lab appointment with the Cancer Center, please go directly to the Cancer Center and check in at the registration area.   Wear comfortable clothing and clothing appropriate for easy access to any Portacath or PICC line.   We strive to give you quality time with your provider. You may need to reschedule your appointment if you arrive late (15 or more minutes).  Arriving late affects you and other patients whose appointments are after yours.  Also, if you miss three or more appointments without notifying the office, you may be dismissed from the clinic at the provider's discretion.      For prescription refill requests, have your pharmacy contact our office and allow 72 hours for refills to be completed.    Today you received the following chemotherapy and/or immunotherapy agents: Keytruda/Alimta.      To help prevent nausea and vomiting after your treatment, we encourage you to take your nausea medication as directed.  BELOW ARE SYMPTOMS THAT SHOULD BE REPORTED IMMEDIATELY: *FEVER GREATER THAN 100.4 F (38 C) OR HIGHER *CHILLS OR SWEATING *NAUSEA AND VOMITING THAT IS NOT CONTROLLED WITH YOUR NAUSEA MEDICATION *UNUSUAL SHORTNESS OF BREATH *UNUSUAL BRUISING OR BLEEDING *URINARY PROBLEMS (pain or burning when urinating, or frequent urination) *BOWEL PROBLEMS (unusual diarrhea, constipation, pain near the anus) TENDERNESS IN MOUTH AND THROAT WITH OR WITHOUT PRESENCE OF ULCERS (sore throat, sores in mouth, or a toothache) UNUSUAL RASH, SWELLING OR PAIN  UNUSUAL VAGINAL DISCHARGE OR ITCHING   Items with * indicate a potential emergency and should be followed up as soon as possible or go to the Emergency Department if any problems should occur.  Please show the CHEMOTHERAPY ALERT CARD or IMMUNOTHERAPY ALERT CARD at  check-in to the Emergency Department and triage nurse.  Should you have questions after your visit or need to cancel or reschedule your appointment, please contact [facility]  Dept: [phone]  and follow the prompts.  Office hours are 8:00 a.m. to 4:30 p.m. Monday - Friday. Please note that voicemails left after 4:00 p.m. may not be returned until the following business day.  We are closed weekends and major holidays. You have access to a nurse at all times for urgent questions. Please call the main number to the clinic Dept: [phone] and follow the prompts.   For any non-urgent questions, you may also contact your provider using MyChart. We now offer e-Visits for anyone 18 and older to request care online for non-urgent symptoms. For details visit [email or web address].   Also download the MyChart app! Go to the app store, search "MyChart", open the app, select , and log in with your MyChart username and password.  Due to Covid, a mask is required upon entering the hospital/clinic. If you do not have a mask, one will be given to you upon arrival. For doctor visits, patients may have 1 support person aged 18 or older with them. For treatment visits, patients cannot have anyone with them due to current Covid guidelines and our immunocompromised population.   

## 2021-05-16 ENCOUNTER — Ambulatory Visit
Admission: RE | Admit: 2021-05-16 | Discharge: 2021-05-16 | Disposition: A | Discharge status: Home / Self Care | Payer: 59 | Source: Ambulatory Visit | Attending: Radiation Oncology | Admitting: Radiation Oncology

## 2021-05-16 DIAGNOSIS — C7931 Secondary malignant neoplasm of brain: Secondary | ICD-10-CM

## 2021-05-16 MED ORDER — GADOBENATE DIMEGLUMINE 529 MG/ML IV SOLN
15.0000 mL | Freq: Once | INTRAVENOUS | Status: AC | PRN
  Administered 2021-05-16: 15 mL via INTRAVENOUS

## 2021-05-17 ENCOUNTER — Encounter: Payer: Self-pay | Admitting: Radiation Oncology

## 2021-05-17 NOTE — Progress Notes (Signed)
Patient states doing well. No symptoms reported at this time. Meaningful use complete.  Patient notified of 2:30pm-05/20/21 telephone appointment and verbalized understanding.  Patient preferred contact # [phone]

## 2021-05-20 ENCOUNTER — Ambulatory Visit
Admission: RE | Admit: 2021-05-20 | Discharge: 2021-05-20 | Disposition: A | Discharge status: Home / Self Care | Payer: 59 | Source: Ambulatory Visit | Attending: Radiation Oncology | Admitting: Radiation Oncology

## 2021-05-20 DIAGNOSIS — C7931 Secondary malignant neoplasm of brain: Secondary | ICD-10-CM

## 2021-05-20 DIAGNOSIS — C3491 Malignant neoplasm of unspecified part of right bronchus or lung: Secondary | ICD-10-CM

## 2021-05-20 NOTE — Progress Notes (Signed)
Radiation Oncology         [phone] ________________________________  Outpatient Follow Up - Conducted via telephone due to current COVID-19 concerns for limiting patient exposure  I spoke with the patient to conduct this consult visit via telephone to spare the patient unnecessary potential exposure in the healthcare setting during the current COVID-19 pandemic. The patient was notified in advance and was offered a [place] meeting to allow for face to face communication but unfortunately reported that they did not have the appropriate resources/technology to support such a visit and instead preferred to proceed with a telephone visit.   Name: Nicolas Allen        MRN: [id number]  Date of Service: 05/20/2021 DOB: 11/29/1955  JJ:HERD, Hunt Oris, MD  Curt Bears, MD     REFERRING PHYSICIAN: Curt Bears, MD   DIAGNOSIS: The primary encounter diagnosis was Non-small cell carcinoma of right lung, stage 4 ([place]). A diagnosis of Brain metastases [facility]) was also pertinent to this visit.   HISTORY OF PRESENT ILLNESS: Nicolas Allen is a 65 y.o. malewith a history of stage IV non-small cell lung cancer, adenocarcinoma involving multifocal disease in the lung and 3 brain metastases. He was diagnosed in the fall of 2021 and found to have metastatic disease in the lung and brain for which he has received stereotactic radiosurgery [facility]) with Dr. Lisbeth Renshaw and has continued on chemo/immunotherapy with Dr. Julien Nordmann.    He had a repeat MRI on 05/16/21 that showed unchanged findings in the cerebellar vermis but no new or progressive changes. He continues to also have a right parotid lesion that is stable since October 2021.   PREVIOUS RADIATION THERAPY:  04/20/20 SRS Treatment: Each site below was treated to 20 Gy in 1 fraction PTV1 Rt Cerebellum 81mm PTV2Mid cerebellum 59mm PTV3Lt Parietal 21mm    PAST MEDICAL HISTORY:  Past Medical History:  Diagnosis Date   Anemia 06/24/2012   ANXIETY  02/03/2007   Cancer ([place])    Cervical disc disease 02/12/2012   S/p surgury jan 2013   Erectile dysfunction 02/11/2011   GERD (gastroesophageal reflux disease)    GLUCOSE INTOLERANCE 01/04/2008   HYPERLIPIDEMIA 02/03/2007   HYPERTENSION 02/03/2007   IBS (irritable bowel syndrome)    Impaired glucose tolerance 02/11/2011   Smoker 08/22/2014   Substance abuse ([place]) 2001   sober for 16 yrs       PAST SURGICAL HISTORY: Past Surgical History:  Procedure Laterality Date   BRONCHIAL BIOPSY  03/30/2020   Procedure: BRONCHIAL BIOPSIES;  Surgeon: Garner Nash, DO;  Location: [place] [facility];  Service: Pulmonary;;   BRONCHIAL BRUSHINGS  03/30/2020   Procedure: BRONCHIAL BRUSHINGS;  Surgeon: Garner Nash, DO;  Location: [place] [facility];  Service: Pulmonary;;   BRONCHIAL NEEDLE ASPIRATION BIOPSY  03/30/2020   Procedure: BRONCHIAL NEEDLE ASPIRATION BIOPSIES;  Surgeon: Garner Nash, DO;  Location: [place] [facility];  Service: Pulmonary;;   BRONCHIAL WASHINGS  03/30/2020   Procedure: BRONCHIAL WASHINGS;  Surgeon: Garner Nash, DO;  Location: [place] [facility];  Service: Pulmonary;;   COLONOSCOPY  2007   IR IMAGING GUIDED PORT INSERTION  04/23/2020   neck fusion  2012   C4   NO PAST SURGERIES     VIDEO BRONCHOSCOPY WITH ENDOBRONCHIAL NAVIGATION N/A 03/30/2020   Procedure: VIDEO BRONCHOSCOPY WITH ENDOBRONCHIAL NAVIGATION;  Surgeon: Garner Nash, DO;  Location: [place];  Service: Pulmonary;  Laterality: N/A;   VIDEO BRONCHOSCOPY WITH ENDOBRONCHIAL ULTRASOUND N/A 03/30/2020   Procedure: VIDEO BRONCHOSCOPY WITH  ENDOBRONCHIAL ULTRASOUND;  Surgeon: Garner Nash, DO;  Location: [place] [facility];  Service: Pulmonary;  Laterality: N/A;     FAMILY HISTORY:  Family History  Problem Relation Age of Onset   Cancer Mother        esophagus   Cancer Cousin    Stroke Other    Hypertension Other    Colon cancer Neg Hx      SOCIAL HISTORY:  reports that he quit smoking about 14 months ago. His smoking  use included cigarettes. He has a 45.00 pack-year smoking history. He has quit using smokeless tobacco.  His smokeless tobacco use included chew. He reports that he does not drink alcohol and does not use drugs. The patient is married and resides in [place]. He enjoys throwing horseshoes and competes in local tournaments between April and October.    ALLERGIES: Patient has no known allergies.   MEDICATIONS:  Current Outpatient Medications  Medication Sig Dispense Refill   amLODipine-benazepril (LOTREL) 10-20 MG capsule TAKE 1 CAPSULE BY MOUTH ONCE DAILY 90 capsule 3   aspirin 325 MG EC tablet Take 1 tablet (325 mg total) by mouth daily. 90 tablet 99   b complex vitamins tablet Take 1 tablet by mouth daily.     cholecalciferol (VITAMIN D3) 25 MCG (1000 UNIT) tablet Take 1,000 Units by mouth daily.     folic acid (FOLVITE) 1 MG tablet Take 1 tablet (1 mg total) by mouth daily. 90 tablet 3   Garlic 6295 MG CAPS Take 1,000 mg by mouth daily.      ibuprofen (ADVIL) 200 MG tablet Take 200 mg by mouth every 6 (six) hours as needed.     lidocaine-prilocaine (EMLA) cream Apply to the Port-A-Cath site 30-60 minutes before chemotherapy. 30 g 0   Omega-3 Fatty Acids (FISH OIL PO) Take 1 capsule by mouth daily.      polyethylene glycol (MIRALAX / GLYCOLAX) 17 g packet Take 17 g by mouth daily as needed.     prochlorperazine (COMPAZINE) 10 MG tablet Take 1 tablet (10 mg total) by mouth every 6 (six) hours as needed for nausea or vomiting. 30 tablet 0   rosuvastatin (CRESTOR) 20 MG tablet Take 1 tablet (20 mg total) by mouth daily. 90 tablet 3   vardenafil (LEVITRA) 20 MG tablet TAKE 1 TABLET BY MOUTH AS NEEDED FOR  ERECTILE  DYSFUNCTION 10 tablet 5   No current facility-administered medications for this encounter.     REVIEW OF SYSTEMS: On review of systems, the patient reports he is doing well with his chemo and immunotherapy. He denies any concerns with his breathing at this time. He does state  he's had some recent changes in his blood pressure medication with his PCP Dr. Jenny Reichmann. He also reports he has felt somewhat dizzy in the past week or so, especially when getting up in the morning. He is sitting down to breakfast when I called. He denies any changes in vision, hearing, movement, or headaches. No other complaints are verbalized.  PHYSICAL EXAM:  Unable to assess given encounter type.    ECOG = 1  0 - Asymptomatic (Fully active, able to carry on all predisease activities without restriction)  1 - Symptomatic but completely ambulatory (Restricted in physically strenuous activity but ambulatory and able to carry out work of a light or sedentary nature. For example, light housework, office work)  2 - Symptomatic, <50% in bed during the day (Ambulatory and capable of all self care but unable to  carry out any work activities. Up and about more than 50% of waking hours)  3 - Symptomatic, >50% in bed, but not bedbound (Capable of only limited self-care, confined to bed or chair 50% or more of waking hours)  4 - Bedbound (Completely disabled. Cannot carry on any self-care. Totally confined to bed or chair)  5 - Death   Nicolas Allen MM, Creech RH, Tormey DC, et al. [phone]). "Toxicity and response criteria of the [facility]". [place] Oncol. 5 (6): 649-55    LABORATORY DATA:  Lab Results  Component Value Date   WBC 5.5 05/07/2021   HGB 10.6 (L) 05/07/2021   HCT 31.1 (L) 05/07/2021   MCV 83.4 05/07/2021   PLT 184 05/07/2021   Lab Results  Component Value Date   NA 139 05/07/2021   K 4.1 05/07/2021   CL 105 05/07/2021   CO2 25 05/07/2021   Lab Results  Component Value Date   ALT 32 05/07/2021   AST 34 05/07/2021   ALKPHOS 54 05/07/2021   BILITOT 0.3 05/07/2021      RADIOGRAPHY: CT Chest W Contrast  Result Date: 05/07/2021 CLINICAL DATA:  Non-small cell lung cancer restaging EXAM: CT CHEST, ABDOMEN, AND PELVIS WITH CONTRAST TECHNIQUE:  Multidetector CT imaging of the chest, abdomen and pelvis was performed following the standard protocol during bolus administration of intravenous contrast. CONTRAST:  17mL OMNIPAQUE IOHEXOL 350 MG/ML SOLN, additional oral enteric contrast COMPARISON:  03/01/2021 FINDINGS: CT CHEST FINDINGS Cardiovascular: Right chest port catheter. Normal heart size. No pericardial effusion. Mediastinum/Nodes: No enlarged mediastinal, hilar, or axillary lymph nodes. Thyroid gland, trachea, and esophagus demonstrate no significant findings. Lungs/Pleura: Unchanged mild centrilobular emphysema. Unchanged post treatment appearance of a mass of the posterior right upper lobe, measuring 3.7 by 1.1 cm (series 4, image 66). Numerous additional sub solid, ground-glass, and mixed solid and cystic nodules throughout the lungs are unchanged, for example in the posterior right apex a 1.6 x 1.0 cm subsolid nodule (series 4, image 30), a predominantly ground-glass lesion of the inferior right lower lobe measuring 2.8 x 2.1 cm (series 4, image 83), and a mixed solid and cystic lesion of the dependent left lower lobe measuring 3.2 x 2.7 cm (series 4, image 113). No pleural effusion or pneumothorax. Musculoskeletal: No chest wall mass or suspicious bone lesions identified. CT ABDOMEN PELVIS FINDINGS Hepatobiliary: No solid liver abnormality is seen. No gallstones, gallbladder wall thickening, or biliary dilatation. Pancreas: Unremarkable. No pancreatic ductal dilatation or surrounding inflammatory changes. Spleen: Normal in size without significant abnormality. Adrenals/Urinary Tract: Adrenal glands are unremarkable. Kidneys are normal, without renal calculi, solid lesion, or hydronephrosis. Bladder is unremarkable. Stomach/Bowel: Stomach is within normal limits. Appendix appears normal. There is a focally thickened loop of distal small bowel in the left hemipelvis (series 2, image 104). Vascular/Lymphatic: Aortic atherosclerosis. No enlarged  abdominal or pelvic lymph nodes. Reproductive: No mass or other abnormality. Other: No abdominal wall hernia or abnormality. No abdominopelvic ascites. Musculoskeletal: No acute or significant osseous findings. IMPRESSION: 1. Unchanged post treatment appearance of a mass of the posterior right upper lobe. 2. Numerous additional subsolid, ground-glass, and mixed solid and cystic nodules throughout the lungs are unchanged, and remain in general suspicious for multifocal adenocarcinoma or metastatic disease. 3. No evidence of distant metastatic disease in the abdomen or pelvis. 4. There is a focally thickened loop of distal small bowel in the left hemipelvis, consistent with nonspecific infectious, inflammatory, or ischemic enteritis. Attention on follow-up. 5. Emphysema.  Aortic Atherosclerosis (ICD10-I70.0) and Emphysema (ICD10-J43.9). Electronically Signed   By: Delanna Ahmadi M.D.   On: 05/07/2021 08:19   MR Brain W Wo Contrast  Result Date: 05/17/2021 CLINICAL DATA:  Restaging of lung cancer.  SRS. EXAM: MRI HEAD WITHOUT AND WITH CONTRAST TECHNIQUE: Multiplanar, multiecho pulse sequences of the brain and surrounding structures were obtained without and with intravenous contrast. CONTRAST:  01/31/2021 COMPARISON:  01/31/2021. FINDINGS: Brain: No acute infarct, mass effect or extra-axial collection. No acute or chronic hemorrhage. There is multifocal hyperintense T2-weighted signal within the white matter. Generalized volume loss without a clear lobar predilection. The midline structures are normal. Unchanged appearance of 5 mm contrast enhancing lesion of the cerebellar vermis (series 11, image 47). No new lesions. Vascular: Major flow voids are preserved. Skull and upper cervical spine: Hyperintense T2-weighted signal right parotid mass measures 2.2 cm, unchanged. Normal calvarium and visualized upper cervical spine Sinuses/Orbits:No paranasal sinus fluid levels or advanced mucosal thickening. No mastoid or  middle ear effusion. Normal orbits. IMPRESSION: Unchanged appearance of 5 mm contrast enhancing lesion of the cerebellar vermis. No new lesions. Electronically Signed   By: Ulyses Jarred M.D.   On: 05/17/2021 18:45   CT Abdomen Pelvis W Contrast  Result Date: 05/07/2021 CLINICAL DATA:  Non-small cell lung cancer restaging EXAM: CT CHEST, ABDOMEN, AND PELVIS WITH CONTRAST TECHNIQUE: Multidetector CT imaging of the chest, abdomen and pelvis was performed following the standard protocol during bolus administration of intravenous contrast. CONTRAST:  10mL OMNIPAQUE IOHEXOL 350 MG/ML SOLN, additional oral enteric contrast COMPARISON:  03/01/2021 FINDINGS: CT CHEST FINDINGS Cardiovascular: Right chest port catheter. Normal heart size. No pericardial effusion. Mediastinum/Nodes: No enlarged mediastinal, hilar, or axillary lymph nodes. Thyroid gland, trachea, and esophagus demonstrate no significant findings. Lungs/Pleura: Unchanged mild centrilobular emphysema. Unchanged post treatment appearance of a mass of the posterior right upper lobe, measuring 3.7 by 1.1 cm (series 4, image 66). Numerous additional sub solid, ground-glass, and mixed solid and cystic nodules throughout the lungs are unchanged, for example in the posterior right apex a 1.6 x 1.0 cm subsolid nodule (series 4, image 30), a predominantly ground-glass lesion of the inferior right lower lobe measuring 2.8 x 2.1 cm (series 4, image 83), and a mixed solid and cystic lesion of the dependent left lower lobe measuring 3.2 x 2.7 cm (series 4, image 113). No pleural effusion or pneumothorax. Musculoskeletal: No chest wall mass or suspicious bone lesions identified. CT ABDOMEN PELVIS FINDINGS Hepatobiliary: No solid liver abnormality is seen. No gallstones, gallbladder wall thickening, or biliary dilatation. Pancreas: Unremarkable. No pancreatic ductal dilatation or surrounding inflammatory changes. Spleen: Normal in size without significant abnormality.  Adrenals/Urinary Tract: Adrenal glands are unremarkable. Kidneys are normal, without renal calculi, solid lesion, or hydronephrosis. Bladder is unremarkable. Stomach/Bowel: Stomach is within normal limits. Appendix appears normal. There is a focally thickened loop of distal small bowel in the left hemipelvis (series 2, image 104). Vascular/Lymphatic: Aortic atherosclerosis. No enlarged abdominal or pelvic lymph nodes. Reproductive: No mass or other abnormality. Other: No abdominal wall hernia or abnormality. No abdominopelvic ascites. Musculoskeletal: No acute or significant osseous findings. IMPRESSION: 1. Unchanged post treatment appearance of a mass of the posterior right upper lobe. 2. Numerous additional subsolid, ground-glass, and mixed solid and cystic nodules throughout the lungs are unchanged, and remain in general suspicious for multifocal adenocarcinoma or metastatic disease. 3. No evidence of distant metastatic disease in the abdomen or pelvis. 4. There is a focally thickened loop of distal small bowel in  the left hemipelvis, consistent with nonspecific infectious, inflammatory, or ischemic enteritis. Attention on follow-up. 5. Emphysema. Aortic Atherosclerosis (ICD10-I70.0) and Emphysema (ICD10-J43.9). Electronically Signed   By: Delanna Ahmadi M.D.   On: 05/07/2021 08:19   ECHOCARDIOGRAM COMPLETE  Result Date: 04/24/2021    ECHOCARDIOGRAM REPORT   Patient Name:   Nicolas Allen Date of Exam: 04/24/2021 Medical Rec #:  [id number]       Height:       72.0 in Accession #:    [id number]      Weight:       167.2 lb Date of Birth:  03-30-1956        BSA:          1.975 m Patient Age:    65 years        BP:           102/60 mmHg Patient Gender: M               HR:           62 bpm. Exam Location:  [place] Procedure: 2D Echo, Cardiac Doppler and Color Doppler Indications:    G45.9 TIA  History:        Patient has no prior history of Echocardiogram examinations.                 TIA; Risk  Factors:Hypertension, Dyslipidemia and Current Smoker.  Sonographer:    Coralyn Helling RDCS Referring Phys: [place]  1. Left ventricular ejection fraction, by estimation, is 60 to 65%. The left ventricle has normal function. The left ventricle has no regional wall motion abnormalities. Left ventricular diastolic parameters are consistent with Grade I diastolic dysfunction (impaired relaxation).  2. Right ventricular systolic function is normal. The right ventricular size is normal. There is normal pulmonary artery systolic pressure. The estimated right ventricular systolic pressure is 96.2 mmHg.  3. The mitral valve is normal in structure. No evidence of mitral valve regurgitation. No evidence of mitral stenosis.  4. Aortic valve calcification mostly localized in non coronary cusp. The aortic valve is tricuspid. There is mild calcification of the aortic valve. There is mild thickening of the aortic valve. Aortic valve regurgitation is not visualized. No aortic stenosis is present.  5. The inferior vena cava is normal in size with greater than 50% respiratory variability, suggesting right atrial pressure of 3 mmHg. Comparison(s): No prior Echocardiogram. Conclusion(s)/Recommendation(s): No intracardiac source of embolism detected on this transthoracic study. A transesophageal echocardiogram is recommended to exclude cardiac source of embolism if clinically indicated. FINDINGS  Left Ventricle: Left ventricular ejection fraction, by estimation, is 60 to 65%. The left ventricle has normal function. The left ventricle has no regional wall motion abnormalities. The left ventricular internal cavity size was normal in size. There is  no left ventricular hypertrophy. Left ventricular diastolic parameters are consistent with Grade I diastolic dysfunction (impaired relaxation). Right Ventricle: The right ventricular size is normal. No increase in right ventricular wall thickness. Right ventricular  systolic function is normal. There is normal pulmonary artery systolic pressure. The tricuspid regurgitant velocity is 2.66 m/s, and  with an assumed right atrial pressure of 3 mmHg, the estimated right ventricular systolic pressure is 83.6 mmHg. Left Atrium: Left atrial size was normal in size. Right Atrium: Right atrial size was normal in size. Pericardium: There is no evidence of pericardial effusion. Mitral Valve: The mitral valve is normal in structure. No evidence of mitral valve regurgitation. No  evidence of mitral valve stenosis. Tricuspid Valve: The tricuspid valve is normal in structure. Tricuspid valve regurgitation is mild . No evidence of tricuspid stenosis. Aortic Valve: Aortic valve calcification mostly localized in non coronary cusp. The aortic valve is tricuspid. There is mild calcification of the aortic valve. There is mild thickening of the aortic valve. Aortic valve regurgitation is not visualized. No  aortic stenosis is present. Pulmonic Valve: The pulmonic valve was normal in structure. Pulmonic valve regurgitation is not visualized. No evidence of pulmonic stenosis. Aorta: The aortic root is normal in size and structure. Venous: The inferior vena cava is normal in size with greater than 50% respiratory variability, suggesting right atrial pressure of 3 mmHg. IAS/Shunts: No atrial level shunt detected by color flow Doppler.  LEFT VENTRICLE PLAX 2D LVIDd:         4.30 cm Diastology LVIDs:         2.70 cm LV e' medial:    10.70 cm/s LV PW:         0.90 cm LV E/e' medial:  7.3 LV IVS:        1.10 cm LV e' lateral:   13.60 cm/s                        LV E/e' lateral: 5.7  RIGHT VENTRICLE             IVC RV S prime:     19.50 cm/s  IVC diam: 1.20 cm TAPSE (M-mode): 2.1 cm RVSP:           31.3 mmHg LEFT ATRIUM             Index        RIGHT ATRIUM           Index LA diam:        3.70 cm 1.87 cm/m   RA Pressure: 3.00 mmHg LA Vol (A2C):   40.8 ml 20.66 ml/m  RA Area:     13.40 cm LA Vol (A4C):    33.6 ml 17.02 ml/m  RA Volume:   34.00 ml  17.22 ml/m LA Biplane Vol: 36.9 ml 18.69 ml/m  AORTIC VALVE LVOT Vmax:   91.00 cm/s LVOT Vmean:  55.000 cm/s LVOT VTI:    0.179 m  AORTA Ao Asc diam: 3.00 cm MITRAL VALVE               TRICUSPID VALVE MV Area (PHT): 2.38 cm    TR Peak grad:   28.3 mmHg MV Decel Time: 319 msec    TR Vmax:        266.00 cm/s MV E velocity: 77.60 cm/s  Estimated RAP:  3.00 mmHg MV A velocity: 85.90 cm/s  RVSP:           31.3 mmHg MV E/A ratio:  0.90                            SHUNTS                            Systemic VTI: 0.18 m Candee Furbish MD Electronically signed by Candee Furbish MD Signature Date/Time: 04/24/2021/11:07:05 AM    Final         IMPRESSION/PLAN: 1. Stage IV non-small cell lung cancer, adenocarcinoma involving multifocal disease in the lung and brain metastases. The patient continues to do well clinically with  his systemic therapy. Radiographically no new disease is noted, and the changes in the cerebellum are stable and post treatment related. I do not think his dizziness is from his previously treated brain disease. He will continue Keytruda/Alimta with Dr. Julien Nordmann. We discussed repeating his MRI in 4 months for surveillance as he is now a year out from [facility].  He will let us know if he has questions or concerns prior to his next visit.  2. Right parotid nodule. The patient continues to be asymptomatic and we will follow these expectantly. 3. Dizziness. The patient is just now eating his breakfast at 11:30 am. I encouraged him to also increase his oral hydration and to keep track of his blood pressure 2-3 times a day and in the next day or so report to Dr. Jenny Reichmann since recent changes have been made to his blood pressure.    Given current concerns for patient exposure during the COVID-19 pandemic, this encounter was conducted via telephone.  The patient has provided two factor identification and has given verbal consent for this type of encounter and has been advised  to only accept a meeting of this type in a secure network environment. The time spent during this encounter was 30 minutes including preparation, discussion, and coordination of the patient's care. The attendants for this meeting include Hayden Pedro  and [name] a . During the encounter,  Hayden Pedro was located at [facility] Radiation Oncology Department.  Nicolas Allen was located at home.     Carola Rhine, PAC

## 2021-05-28 ENCOUNTER — Inpatient Hospital Stay: Payer: 59 | Attending: Internal Medicine

## 2021-05-28 ENCOUNTER — Inpatient Hospital Stay: Payer: 59

## 2021-05-28 ENCOUNTER — Encounter: Payer: Self-pay | Admitting: Internal Medicine

## 2021-05-28 ENCOUNTER — Inpatient Hospital Stay (HOSPITAL_BASED_OUTPATIENT_CLINIC_OR_DEPARTMENT_OTHER): Payer: 59 | Admitting: Internal Medicine

## 2021-05-28 ENCOUNTER — Other Ambulatory Visit: Payer: Self-pay

## 2021-05-28 VITALS — BP 100/60 | HR 73 | Temp 97.5°F | Resp 19 | Wt 166.8 lb

## 2021-05-28 DIAGNOSIS — C3491 Malignant neoplasm of unspecified part of right bronchus or lung: Secondary | ICD-10-CM

## 2021-05-28 DIAGNOSIS — C7931 Secondary malignant neoplasm of brain: Secondary | ICD-10-CM

## 2021-05-28 DIAGNOSIS — Z5112 Encounter for antineoplastic immunotherapy: Secondary | ICD-10-CM | POA: Diagnosis present

## 2021-05-28 DIAGNOSIS — C3411 Malignant neoplasm of upper lobe, right bronchus or lung: Secondary | ICD-10-CM | POA: Insufficient documentation

## 2021-05-28 DIAGNOSIS — Z5111 Encounter for antineoplastic chemotherapy: Secondary | ICD-10-CM | POA: Insufficient documentation

## 2021-05-28 DIAGNOSIS — Z87891 Personal history of nicotine dependence: Secondary | ICD-10-CM | POA: Insufficient documentation

## 2021-05-28 DIAGNOSIS — Z79899 Other long term (current) drug therapy: Secondary | ICD-10-CM | POA: Diagnosis not present

## 2021-05-28 DIAGNOSIS — Z95828 Presence of other vascular implants and grafts: Secondary | ICD-10-CM

## 2021-05-28 LAB — CBC WITH DIFFERENTIAL (CANCER CENTER ONLY)
Abs Immature Granulocytes: 0.01 10*3/uL (ref 0.00–0.07)
Basophils Absolute: 0 10*3/uL (ref 0.0–0.1)
Basophils Relative: 1 %
Eosinophils Absolute: 0.1 10*3/uL (ref 0.0–0.5)
Eosinophils Relative: 2 %
HCT: 31.2 % — ABNORMAL LOW (ref 39.0–52.0)
Hemoglobin: 10.6 g/dL — ABNORMAL LOW (ref 13.0–17.0)
Immature Granulocytes: 0 %
Lymphocytes Relative: 32 %
Lymphs Abs: 1.7 10*3/uL (ref 0.7–4.0)
MCH: 28.6 pg (ref 26.0–34.0)
MCHC: 34 g/dL (ref 30.0–36.0)
MCV: 84.1 fL (ref 80.0–100.0)
Monocytes Absolute: 0.7 10*3/uL (ref 0.1–1.0)
Monocytes Relative: 13 %
Neutro Abs: 2.8 10*3/uL (ref 1.7–7.7)
Neutrophils Relative %: 52 %
Platelet Count: 157 10*3/uL (ref 150–400)
RBC: 3.71 MIL/uL — ABNORMAL LOW (ref 4.22–5.81)
RDW: 16.4 % — ABNORMAL HIGH (ref 11.5–15.5)
WBC Count: 5.3 10*3/uL (ref 4.0–10.5)
nRBC: 0 % (ref 0.0–0.2)

## 2021-05-28 LAB — TSH: TSH: 1.05 u[IU]/mL (ref 0.320–4.118)

## 2021-05-28 LAB — CMP (CANCER CENTER ONLY)
ALT: 37 U/L (ref 0–44)
AST: 35 U/L (ref 15–41)
Albumin: 3.4 g/dL — ABNORMAL LOW (ref 3.5–5.0)
Alkaline Phosphatase: 53 U/L (ref 38–126)
Anion gap: 9 (ref 5–15)
BUN: 13 mg/dL (ref 8–23)
CO2: 25 mmol/L (ref 22–32)
Calcium: 8.7 mg/dL — ABNORMAL LOW (ref 8.9–10.3)
Chloride: 105 mmol/L (ref 98–111)
Creatinine: 1 mg/dL (ref 0.61–1.24)
GFR, Estimated: >60 mL/min (ref >60)
Glucose, Bld: 94 mg/dL (ref 70–99)
Potassium: 3.7 mmol/L (ref 3.5–5.1)
Sodium: 139 mmol/L (ref 135–145)
Total Bilirubin: 0.5 mg/dL (ref 0.3–1.2)
Total Protein: 7.2 g/dL (ref 6.5–8.1)

## 2021-05-28 MED ORDER — PROCHLORPERAZINE MALEATE 10 MG PO TABS
10.0000 mg | ORAL_TABLET | Freq: Once | ORAL | Status: AC
  Administered 2021-05-28: 10 mg via ORAL
  Filled 2021-05-28: qty 1

## 2021-05-28 MED ORDER — SODIUM CHLORIDE 0.9% FLUSH
10.0000 mL | INTRAVENOUS | Status: DC | PRN
  Administered 2021-05-28: 10 mL

## 2021-05-28 MED ORDER — SODIUM CHLORIDE 0.9 % IV SOLN: Freq: Once | INTRAVENOUS | Status: AC

## 2021-05-28 MED ORDER — SODIUM CHLORIDE 0.9% FLUSH
10.0000 mL | Freq: Once | INTRAVENOUS | Status: AC
  Administered 2021-05-28: 10 mL

## 2021-05-28 MED ORDER — HEPARIN SOD (PORK) LOCK FLUSH 100 UNIT/ML IV SOLN
500.0000 [IU] | Freq: Once | INTRAVENOUS | Status: AC | PRN
  Administered 2021-05-28: 500 [IU]

## 2021-05-28 MED ORDER — SODIUM CHLORIDE 0.9 % IV SOLN
500.0000 mg/m2 | Freq: Once | INTRAVENOUS | Status: AC
  Administered 2021-05-28: 900 mg via INTRAVENOUS
  Filled 2021-05-28: qty 20

## 2021-05-28 MED ORDER — SODIUM CHLORIDE 0.9 % IV SOLN
200.0000 mg | Freq: Once | INTRAVENOUS | Status: AC
  Administered 2021-05-28: 200 mg via INTRAVENOUS
  Filled 2021-05-28: qty 8

## 2021-05-28 NOTE — Patient Instructions (Signed)
[facility]   Discharge Instructions: Thank you for choosing [facility] to provide your oncology and hematology care.   If you have a lab appointment with the [facility], please go directly to the [facility] and check in at the registration area.   Wear comfortable clothing and clothing appropriate for easy access to any Portacath or PICC line.   We strive to give you quality time with your provider. You may need to reschedule your appointment if you arrive late (15 or more minutes).  Arriving late affects you and other patients whose appointments are after yours.  Also, if you miss three or more appointments without notifying the office, you may be dismissed from the clinic at the providers discretion.      For prescription refill requests, have your pharmacy contact our office and allow 72 hours for refills to be completed.    Today you received the following chemotherapy and/or immunotherapy agents: pembrolizumab and pemetrexed.      To help prevent nausea and vomiting after your treatment, we encourage you to take your nausea medication as directed.  BELOW ARE SYMPTOMS THAT SHOULD BE REPORTED IMMEDIATELY: *FEVER GREATER THAN 100.4 F (38 C) OR HIGHER *CHILLS OR SWEATING *NAUSEA AND VOMITING THAT IS NOT CONTROLLED WITH YOUR NAUSEA MEDICATION *UNUSUAL SHORTNESS OF BREATH *UNUSUAL BRUISING OR BLEEDING *URINARY PROBLEMS (pain or burning when urinating, or frequent urination) *BOWEL PROBLEMS (unusual diarrhea, constipation, pain near the anus) TENDERNESS IN MOUTH AND THROAT WITH OR WITHOUT PRESENCE OF ULCERS (sore throat, sores in mouth, or a toothache) UNUSUAL RASH, SWELLING OR PAIN  UNUSUAL VAGINAL DISCHARGE OR ITCHING   Items with * indicate a potential emergency and should be followed up as soon as possible or go to the Emergency Department if any problems should occur.  Please show the CHEMOTHERAPY ALERT CARD or IMMUNOTHERAPY  ALERT CARD at check-in to the Emergency Department and triage nurse.  Should you have questions after your visit or need to cancel or reschedule your appointment, please contact [facility]  Dept: [phone]  and follow the prompts.  Office hours are 8:00 a.m. to 4:30 p.m. Monday - Friday. Please note that voicemails left after 4:00 p.m. may not be returned until the following business day.  We are closed weekends and major holidays. You have access to a nurse at all times for urgent questions. Please call the main number to the clinic Dept: [phone] and follow the prompts.   For any non-urgent questions, you may also contact your provider using MyChart. We now offer e-Visits for anyone 50 and older to request care online for non-urgent symptoms. For details visit [email or web address].   Also download the MyChart app! Go to the app store, search "MyChart", open the app, select [facility], and log in with your MyChart username and password.  Due to Covid, a mask is required upon entering the hospital/clinic. If you do not have a mask, one will be given to you upon arrival. For doctor visits, patients may have 1 support person aged 73 or older with them. For treatment visits, patients cannot have anyone with them due to current Covid guidelines and our immunocompromised population.

## 2021-05-28 NOTE — Progress Notes (Signed)
[name]:([phone]) [phone]   Fax:([phone]  OFFICE PROGRESS NOTE  [name], MD [place] [address]  DIAGNOSIS: Stage IV (T3, N2, M1c) non-small cell lung cancer favoring adenocarcinoma presented with right upper lobe lung mass in addition to right lower lobe, left lower lobe in addition to right hilar and mediastinal lymphadenopathy in addition to metastatic brain lesions diagnosed in October 2021.  Molecular studies by [employer]:  [id number], 9.5%,   PRIOR THERAPY: SRS to 3 brain lesion under the care of Dr. Lisbeth Renshaw.  Scheduled for April 20, 2020  CURRENT THERAPY: Systemic chemotherapy with carboplatin for AUC of 5, Alimta 500 mg/M2 and Keytruda 200 mg IV every 3 weeks.  First dose April 24, 2020.  Status post 19 cycles.  Starting from cycle #5 the patient will be on maintenance treatment with Alimta and Keytruda every 3 weeks.  INTERVAL HISTORY: Nicolas Allen 65 y.o. male returns to the clinic today for follow-up visit.  The patient is feeling fine today with no concerning complaints.  He has no chest pain, shortness of breath, cough or hemoptysis.  He has no nausea, vomiting, diarrhea or constipation.  He has no fever or chills.  He continues to tolerate his maintenance treatment with Alimta and Keytruda fairly well.  He is here today for evaluation before starting cycle #20 of his treatment.  MEDICAL HISTORY: Past Medical History:  Diagnosis Date   Anemia 06/24/2012   ANXIETY 02/03/2007   Cancer ([place])    Cervical disc disease 02/12/2012   S/p surgury jan 2013   Erectile dysfunction 02/11/2011   GERD (gastroesophageal reflux disease)    GLUCOSE INTOLERANCE 01/04/2008   HYPERLIPIDEMIA 02/03/2007   HYPERTENSION 02/03/2007   IBS (irritable bowel syndrome)    Impaired glucose tolerance 02/11/2011   Smoker 08/22/2014   Substance abuse ([place]) 2001   sober for 16 yrs    ALLERGIES:  has No Known Allergies.  MEDICATIONS:   Current Outpatient Medications  Medication Sig Dispense Refill   amLODipine-benazepril (LOTREL) 10-20 MG capsule TAKE 1 CAPSULE BY MOUTH ONCE DAILY 90 capsule 3   aspirin 325 MG EC tablet Take 1 tablet (325 mg total) by mouth daily. 90 tablet 99   b complex vitamins tablet Take 1 tablet by mouth daily.     cholecalciferol (VITAMIN D3) 25 MCG (1000 UNIT) tablet Take 1,000 Units by mouth daily.     folic acid (FOLVITE) 1 MG tablet Take 1 tablet (1 mg total) by mouth daily. 90 tablet 3   Garlic 9371 MG CAPS Take 1,000 mg by mouth daily.      ibuprofen (ADVIL) 200 MG tablet Take 200 mg by mouth every 6 (six) hours as needed.     lidocaine-prilocaine (EMLA) cream Apply to the Port-A-Cath site 30-60 minutes before chemotherapy. 30 g 0   Omega-3 Fatty Acids (FISH OIL PO) Take 1 capsule by mouth daily.      polyethylene glycol (MIRALAX / GLYCOLAX) 17 g packet Take 17 g by mouth daily as needed.     prochlorperazine (COMPAZINE) 10 MG tablet Take 1 tablet (10 mg total) by mouth every 6 (six) hours as needed for nausea or vomiting. 30 tablet 0   rosuvastatin (CRESTOR) 20 MG tablet Take 1 tablet (20 mg total) by mouth daily. 90 tablet 3   vardenafil (LEVITRA) 20 MG tablet TAKE 1 TABLET BY MOUTH AS NEEDED FOR  ERECTILE  DYSFUNCTION 10 tablet 5  No current facility-administered medications for this visit.    SURGICAL HISTORY:  Past Surgical History:  Procedure Laterality Date   BRONCHIAL BIOPSY  03/30/2020   Procedure: BRONCHIAL BIOPSIES;  Surgeon: Garner Nash, DO;  Location: [facility];  Service: Pulmonary;;   BRONCHIAL BRUSHINGS  03/30/2020   Procedure: BRONCHIAL BRUSHINGS;  Surgeon: Garner Nash, DO;  Location: [facility];  Service: Pulmonary;;   BRONCHIAL NEEDLE ASPIRATION BIOPSY  03/30/2020   Procedure: BRONCHIAL NEEDLE ASPIRATION BIOPSIES;  Surgeon: Garner Nash, DO;  Location: [facility];  Service: Pulmonary;;   BRONCHIAL WASHINGS  03/30/2020   Procedure: BRONCHIAL  WASHINGS;  Surgeon: Garner Nash, DO;  Location: [facility];  Service: Pulmonary;;   COLONOSCOPY  2007   IR IMAGING GUIDED PORT INSERTION  04/23/2020   neck fusion  2012   C4   NO PAST SURGERIES     VIDEO BRONCHOSCOPY WITH ENDOBRONCHIAL NAVIGATION N/A 03/30/2020   Procedure: VIDEO BRONCHOSCOPY WITH ENDOBRONCHIAL NAVIGATION;  Surgeon: Garner Nash, DO;  Location: [facility];  Service: Pulmonary;  Laterality: N/A;   VIDEO BRONCHOSCOPY WITH ENDOBRONCHIAL ULTRASOUND N/A 03/30/2020   Procedure: VIDEO BRONCHOSCOPY WITH ENDOBRONCHIAL ULTRASOUND;  Surgeon: Garner Nash, DO;  Location: [facility];  Service: Pulmonary;  Laterality: N/A;    REVIEW OF SYSTEMS:  A comprehensive review of systems was negative.   PHYSICAL EXAMINATION: General appearance: alert, cooperative, and no distress Head: Normocephalic, without obvious abnormality, atraumatic Neck: no adenopathy, no JVD, supple, symmetrical, trachea midline, and thyroid not enlarged, symmetric, no tenderness/mass/nodules Lymph nodes: Cervical, supraclavicular, and axillary nodes normal. Resp: clear to auscultation bilaterally Back: symmetric, no curvature. ROM normal. No CVA tenderness. Cardio: regular rate and rhythm, S1, S2 normal, no murmur, click, rub or gallop GI: soft, non-tender; bowel sounds normal; no masses,  no organomegaly Extremities: extremities normal, atraumatic, no cyanosis or edema  ECOG PERFORMANCE STATUS: 0 - Asymptomatic  Blood pressure 100/60, pulse 73, temperature (!) 97.5 F (36.4 C), temperature source Tympanic, resp. rate 19, weight 166 lb 12.8 oz (75.7 kg), SpO2 100 %.  LABORATORY DATA: Lab Results  Component Value Date   WBC 5.3 05/28/2021   HGB 10.6 (L) 05/28/2021   HCT 31.2 (L) 05/28/2021   MCV 84.1 05/28/2021   PLT 157 05/28/2021      Chemistry      Component Value Date/Time   NA 139 05/07/2021 0901   K 4.1 05/07/2021 0901   CL 105 05/07/2021 0901   CO2 25 05/07/2021 0901   BUN 11  05/07/2021 0901   CREATININE 1.01 05/07/2021 0901   CREATININE 0.88 02/21/2020 1115      Component Value Date/Time   CALCIUM 8.7 (L) 05/07/2021 0901   ALKPHOS 54 05/07/2021 0901   AST 34 05/07/2021 0901   ALT 32 05/07/2021 0901   BILITOT 0.3 05/07/2021 0901       RADIOGRAPHIC STUDIES: CT Chest W Contrast  Result Date: 05/07/2021 CLINICAL DATA:  Non-small cell lung cancer restaging EXAM: CT CHEST, ABDOMEN, AND PELVIS WITH CONTRAST TECHNIQUE: Multidetector CT imaging of the chest, abdomen and pelvis was performed following the standard protocol during bolus administration of intravenous contrast. CONTRAST:  55mL OMNIPAQUE IOHEXOL 350 MG/ML SOLN, additional oral enteric contrast COMPARISON:  03/01/2021 FINDINGS: CT CHEST FINDINGS Cardiovascular: Right chest port catheter. Normal heart size. No pericardial effusion. Mediastinum/Nodes: No enlarged mediastinal, hilar, or axillary lymph nodes. Thyroid gland, trachea, and esophagus demonstrate no significant findings. Lungs/Pleura: Unchanged mild centrilobular emphysema. Unchanged post treatment appearance of a mass  of the posterior right upper lobe, measuring 3.7 by 1.1 cm (series 4, image 66). Numerous additional sub solid, ground-glass, and mixed solid and cystic nodules throughout the lungs are unchanged, for example in the posterior right apex a 1.6 x 1.0 cm subsolid nodule (series 4, image 30), a predominantly ground-glass lesion of the inferior right lower lobe measuring 2.8 x 2.1 cm (series 4, image 83), and a mixed solid and cystic lesion of the dependent left lower lobe measuring 3.2 x 2.7 cm (series 4, image 113). No pleural effusion or pneumothorax. Musculoskeletal: No chest wall mass or suspicious bone lesions identified. CT ABDOMEN PELVIS FINDINGS Hepatobiliary: No solid liver abnormality is seen. No gallstones, gallbladder wall thickening, or biliary dilatation. Pancreas: Unremarkable. No pancreatic ductal dilatation or surrounding  inflammatory changes. Spleen: Normal in size without significant abnormality. Adrenals/Urinary Tract: Adrenal glands are unremarkable. Kidneys are normal, without renal calculi, solid lesion, or hydronephrosis. Bladder is unremarkable. Stomach/Bowel: Stomach is within normal limits. Appendix appears normal. There is a focally thickened loop of distal small bowel in the left hemipelvis (series 2, image 104). Vascular/Lymphatic: Aortic atherosclerosis. No enlarged abdominal or pelvic lymph nodes. Reproductive: No mass or other abnormality. Other: No abdominal wall hernia or abnormality. No abdominopelvic ascites. Musculoskeletal: No acute or significant osseous findings. IMPRESSION: 1. Unchanged post treatment appearance of a mass of the posterior right upper lobe. 2. Numerous additional subsolid, ground-glass, and mixed solid and cystic nodules throughout the lungs are unchanged, and remain in general suspicious for multifocal adenocarcinoma or metastatic disease. 3. No evidence of distant metastatic disease in the abdomen or pelvis. 4. There is a focally thickened loop of distal small bowel in the left hemipelvis, consistent with nonspecific infectious, inflammatory, or ischemic enteritis. Attention on follow-up. 5. Emphysema. Aortic Atherosclerosis (ICD10-I70.0) and Emphysema (ICD10-J43.9). Electronically Signed   By: Delanna Ahmadi M.D.   On: 05/07/2021 08:19   MR Brain W Wo Contrast  Result Date: 05/17/2021 CLINICAL DATA:  Restaging of lung cancer.  SRS. EXAM: MRI HEAD WITHOUT AND WITH CONTRAST TECHNIQUE: Multiplanar, multiecho pulse sequences of the brain and surrounding structures were obtained without and with intravenous contrast. CONTRAST:  01/31/2021 COMPARISON:  01/31/2021. FINDINGS: Brain: No acute infarct, mass effect or extra-axial collection. No acute or chronic hemorrhage. There is multifocal hyperintense T2-weighted signal within the white matter. Generalized volume loss without a clear lobar  predilection. The midline structures are normal. Unchanged appearance of 5 mm contrast enhancing lesion of the cerebellar vermis (series 11, image 47). No new lesions. Vascular: Major flow voids are preserved. Skull and upper cervical spine: Hyperintense T2-weighted signal right parotid mass measures 2.2 cm, unchanged. Normal calvarium and visualized upper cervical spine Sinuses/Orbits:No paranasal sinus fluid levels or advanced mucosal thickening. No mastoid or middle ear effusion. Normal orbits. IMPRESSION: Unchanged appearance of 5 mm contrast enhancing lesion of the cerebellar vermis. No new lesions. Electronically Signed   By: Ulyses Jarred M.D.   On: 05/17/2021 18:45   CT Abdomen Pelvis W Contrast  Result Date: 05/07/2021 CLINICAL DATA:  Non-small cell lung cancer restaging EXAM: CT CHEST, ABDOMEN, AND PELVIS WITH CONTRAST TECHNIQUE: Multidetector CT imaging of the chest, abdomen and pelvis was performed following the standard protocol during bolus administration of intravenous contrast. CONTRAST:  61mL OMNIPAQUE IOHEXOL 350 MG/ML SOLN, additional oral enteric contrast COMPARISON:  03/01/2021 FINDINGS: CT CHEST FINDINGS Cardiovascular: Right chest port catheter. Normal heart size. No pericardial effusion. Mediastinum/Nodes: No enlarged mediastinal, hilar, or axillary lymph nodes. Thyroid gland, trachea, and esophagus  demonstrate no significant findings. Lungs/Pleura: Unchanged mild centrilobular emphysema. Unchanged post treatment appearance of a mass of the posterior right upper lobe, measuring 3.7 by 1.1 cm (series 4, image 66). Numerous additional sub solid, ground-glass, and mixed solid and cystic nodules throughout the lungs are unchanged, for example in the posterior right apex a 1.6 x 1.0 cm subsolid nodule (series 4, image 30), a predominantly ground-glass lesion of the inferior right lower lobe measuring 2.8 x 2.1 cm (series 4, image 83), and a mixed solid and cystic lesion of the dependent left  lower lobe measuring 3.2 x 2.7 cm (series 4, image 113). No pleural effusion or pneumothorax. Musculoskeletal: No chest wall mass or suspicious bone lesions identified. CT ABDOMEN PELVIS FINDINGS Hepatobiliary: No solid liver abnormality is seen. No gallstones, gallbladder wall thickening, or biliary dilatation. Pancreas: Unremarkable. No pancreatic ductal dilatation or surrounding inflammatory changes. Spleen: Normal in size without significant abnormality. Adrenals/Urinary Tract: Adrenal glands are unremarkable. Kidneys are normal, without renal calculi, solid lesion, or hydronephrosis. Bladder is unremarkable. Stomach/Bowel: Stomach is within normal limits. Appendix appears normal. There is a focally thickened loop of distal small bowel in the left hemipelvis (series 2, image 104). Vascular/Lymphatic: Aortic atherosclerosis. No enlarged abdominal or pelvic lymph nodes. Reproductive: No mass or other abnormality. Other: No abdominal wall hernia or abnormality. No abdominopelvic ascites. Musculoskeletal: No acute or significant osseous findings. IMPRESSION: 1. Unchanged post treatment appearance of a mass of the posterior right upper lobe. 2. Numerous additional subsolid, ground-glass, and mixed solid and cystic nodules throughout the lungs are unchanged, and remain in general suspicious for multifocal adenocarcinoma or metastatic disease. 3. No evidence of distant metastatic disease in the abdomen or pelvis. 4. There is a focally thickened loop of distal small bowel in the left hemipelvis, consistent with nonspecific infectious, inflammatory, or ischemic enteritis. Attention on follow-up. 5. Emphysema. Aortic Atherosclerosis (ICD10-I70.0) and Emphysema (ICD10-J43.9). Electronically Signed   By: Delanna Ahmadi M.D.   On: 05/07/2021 08:19     ASSESSMENT AND PLAN: This is a very pleasant 65 years old African-American male recently diagnosed with a stage IV (T3, N2, M1c)  non-small cell lung cancer, adenocarcinoma  presented with right upper lobe lung mass in addition to right lower lobe, left lower lobe in addition to right hilar and mediastinal lymphadenopathy in addition to metastatic brain lesions diagnosed in October 2021. The patient has molecular studies by [employer] that showed no actionable mutations. He underwent SRS treatment to his brain lesion. The patient is currently undergoing systemic chemotherapy with carboplatin for AUC of 5, Alimta 500 mg/M2 and Keytruda 200 mg IV every 3 weeks status post 19 cycles.  Starting from cycle #5 the patient is on maintenance treatment with Alimta and Keytruda every 3 weeks. The patient continues to tolerate his treatment with maintenance Alimta and Keytruda fairly well. I recommended for him to proceed with cycle #20 today as planned. I will see him back for follow-up visit in 3 weeks for evaluation before the next cycle of his treatment. The patient was advised to call immediately if he has any concerning symptoms in the interval. The patient voices understanding of current disease status and treatment options and is in agreement with the current care plan.  All questions were answered. The patient knows to call the clinic with any problems, questions or concerns. We can certainly see the patient much sooner if necessary.  Disclaimer: This note was dictated with voice recognition software. Similar sounding words can inadvertently be transcribed  and may not be corrected upon review.

## 2021-06-02 ENCOUNTER — Other Ambulatory Visit: Payer: Self-pay

## 2021-06-07 NOTE — Progress Notes (Signed)
[facility] OFFICE PROGRESS NOTE  [name], MD [place] [address]  DIAGNOSIS: Stage IV (T3, N2, M1c) non-small cell lung cancer favoring adenocarcinoma presented with right upper lobe lung mass in addition to right lower lobe, left lower lobe in addition to right hilar and mediastinal lymphadenopathy in addition to metastatic brain lesions diagnosed in October 2021.   Molecular studies by [employer]:   [id number], 9.5%,   PRIOR THERAPY:  SRS to 3 brain lesion under the care of Dr. Lisbeth Renshaw.  Completed on April 20, 2020  CURRENT THERAPY:  Systemic chemotherapy with carboplatin for AUC of 5, Alimta 500 mg/M2 and Keytruda 200 mg IV every 3 weeks.  First dose April 24, 2020.  Status post 20 cycles.  Starting from cycle #5 the patient will be on maintenance treatment with Alimta and Keytruda every 3 weeks.  INTERVAL HISTORY: Nicolas Allen 65 y.o. male returns to the clinic today for a follow-up visit. The patient is feeling well today without any concerning complaints today. He tolerated his last cycle of treatment well without any concerning adverse side effects.  He denies any fever, chills, night sweats, or weight loss.  He denies any nausea or vomiting. He takes mirlax and/or stool softener if needed for constipation. Every "once in awhile" he will have loose stool, but nothing significant. He denies any chest pain, shortness of breath, or hemoptysis.  Denise any major cough. Denies headaches or visual changes. He is followed closely by radiation oncology for his history of metastatic disease to the brain.  He is here today for evaluation before starting cycle #21.    MEDICAL HISTORY: Past Medical History:  Diagnosis Date   Anemia 06/24/2012   ANXIETY 02/03/2007   Cancer ([place])    Cervical disc disease 02/12/2012   S/p surgury jan 2013   Erectile dysfunction 02/11/2011   GERD (gastroesophageal reflux disease)    GLUCOSE INTOLERANCE 01/04/2008    HYPERLIPIDEMIA 02/03/2007   HYPERTENSION 02/03/2007   IBS (irritable bowel syndrome)    Impaired glucose tolerance 02/11/2011   Smoker 08/22/2014   Substance abuse ([place]) 2001   sober for 16 yrs    ALLERGIES:  has No Known Allergies.  MEDICATIONS:  Current Outpatient Medications  Medication Sig Dispense Refill   amLODipine-benazepril (LOTREL) 10-20 MG capsule TAKE 1 CAPSULE BY MOUTH ONCE DAILY 90 capsule 3   aspirin 325 MG EC tablet Take 1 tablet (325 mg total) by mouth daily. 90 tablet 99   b complex vitamins tablet Take 1 tablet by mouth daily.     cholecalciferol (VITAMIN D3) 25 MCG (1000 UNIT) tablet Take 1,000 Units by mouth daily.     folic acid (FOLVITE) 1 MG tablet Take 1 tablet (1 mg total) by mouth daily. 90 tablet 3   Garlic 2774 MG CAPS Take 1,000 mg by mouth daily.      ibuprofen (ADVIL) 200 MG tablet Take 200 mg by mouth every 6 (six) hours as needed.     lidocaine-prilocaine (EMLA) cream Apply to the Port-A-Cath site 30-60 minutes before chemotherapy. 30 g 0   Omega-3 Fatty Acids (FISH OIL PO) Take 1 capsule by mouth daily.      polyethylene glycol (MIRALAX / GLYCOLAX) 17 g packet Take 17 g by mouth daily as needed.     prochlorperazine (COMPAZINE) 10 MG tablet Take 1 tablet (10 mg total) by mouth every 6 (six) hours as needed for nausea or vomiting. 30 tablet 0  rosuvastatin (CRESTOR) 20 MG tablet Take 1 tablet (20 mg total) by mouth daily. 90 tablet 3   vardenafil (LEVITRA) 20 MG tablet TAKE 1 TABLET BY MOUTH AS NEEDED FOR  ERECTILE  DYSFUNCTION 10 tablet 5   No current facility-administered medications for this visit.   Facility-Administered Medications Ordered in Other Visits  Medication Dose Route Frequency Provider Last Rate Last Admin   0.9 %  sodium chloride infusion   Intravenous Once Curt Bears, MD       cyanocobalamin ((VITAMIN B-12)) injection 1,000 mcg  1,000 mcg Intramuscular Once Claryssa Sandner L, PA-C       heparin lock flush 100 unit/mL   500 Units Intracatheter Once PRN Curt Bears, MD       pembrolizumab [facility]) 200 mg in sodium chloride 0.9 % 50 mL chemo infusion  200 mg Intravenous Once Curt Bears, MD       PEMEtrexed (ALIMTA) 900 mg in sodium chloride 0.9 % 100 mL chemo infusion  500 mg/m2 (Treatment Plan Recorded) Intravenous Once Curt Bears, MD       prochlorperazine (COMPAZINE) tablet 10 mg  10 mg Oral Once Curt Bears, MD       sodium chloride flush (NS) 0.9 % injection 10 mL  10 mL Intracatheter PRN Curt Bears, MD        SURGICAL HISTORY:  Past Surgical History:  Procedure Laterality Date   BRONCHIAL BIOPSY  03/30/2020   Procedure: BRONCHIAL BIOPSIES;  Surgeon: Garner Nash, DO;  Location: [facility]OSCOPY;  Service: Pulmonary;;   BRONCHIAL BRUSHINGS  03/30/2020   Procedure: BRONCHIAL BRUSHINGS;  Surgeon: Garner Nash, DO;  Location: [facility] ENDOSCOPY;  Service: Pulmonary;;   BRONCHIAL NEEDLE ASPIRATION BIOPSY  03/30/2020   Procedure: BRONCHIAL NEEDLE ASPIRATION BIOPSIES;  Surgeon: Garner Nash, DO;  Location: [facility]OSCOPY;  Service: Pulmonary;;   BRONCHIAL WASHINGS  03/30/2020   Procedure: BRONCHIAL WASHINGS;  Surgeon: Garner Nash, DO;  Location: [facility];  Service: Pulmonary;;   COLONOSCOPY  2007   IR IMAGING GUIDED PORT INSERTION  04/23/2020   neck fusion  2012   C4   NO PAST SURGERIES     VIDEO BRONCHOSCOPY WITH ENDOBRONCHIAL NAVIGATION N/A 03/30/2020   Procedure: VIDEO BRONCHOSCOPY WITH ENDOBRONCHIAL NAVIGATION;  Surgeon: Garner Nash, DO;  Location: [facility];  Service: Pulmonary;  Laterality: N/A;   VIDEO BRONCHOSCOPY WITH ENDOBRONCHIAL ULTRASOUND N/A 03/30/2020   Procedure: VIDEO BRONCHOSCOPY WITH ENDOBRONCHIAL ULTRASOUND;  Surgeon: Garner Nash, DO;  Location: [facility];  Service: Pulmonary;  Laterality: N/A;    REVIEW OF SYSTEMS:   Review of Systems  Constitutional: Negative for appetite change, chills, fatigue, fever and unexpected weight  change.  HENT: Negative for mouth sores, nosebleeds, sore throat and trouble swallowing.   Eyes: Negative for eye problems and icterus.  Respiratory: Negative for cough, hemoptysis, shortness of breath and wheezing.   Cardiovascular: Negative for chest pain and leg swelling.  Gastrointestinal: Negative for abdominal pain, constipation, diarrhea, nausea and vomiting.  Genitourinary: Negative for bladder incontinence, difficulty urinating, dysuria, frequency and hematuria.   Musculoskeletal: Negative for back pain, gait problem, neck pain and neck stiffness.  Skin: Negative for itching and rash.  Neurological: Negative for dizziness, extremity weakness, gait problem, headaches, light-headedness and seizures.  Hematological: Negative for adenopathy. Does not bruise/bleed easily.  Psychiatric/Behavioral: Negative for confusion, depression and sleep disturbance. The patient is not nervous/anxious.     PHYSICAL EXAMINATION:  Blood pressure (!) 104/58, pulse 78, temperature 97.8 F (36.6 C),  temperature source Tympanic, resp. rate 17, height 6' (1.829 m), weight 165 lb 9.6 oz (75.1 kg), SpO2 100 %.  ECOG PERFORMANCE STATUS: 1  Physical Exam  Constitutional: Oriented to person, place, and time and thin appearing male, and in no distress.  HENT:  Head: Normocephalic and atraumatic.  Mouth/Throat: Oropharynx is clear and moist. No oropharyngeal exudate.  Eyes: Conjunctivae are normal. Right eye exhibits no discharge. Left eye exhibits no discharge. No scleral icterus.  Neck: Normal range of motion. Neck supple.  Cardiovascular: Normal rate, regular rhythm, normal heart sounds and intact distal pulses.   Pulmonary/Chest: Effort normal and breath sounds normal. No respiratory distress. No wheezes. No rales.  Abdominal: Soft. Bowel sounds are normal. Exhibits no distension and no mass. There is no tenderness.  Musculoskeletal: Normal range of motion. Exhibits no edema.  Lymphadenopathy:    No  cervical adenopathy.  Neurological: Alert and oriented to person, place, and time. Exhibits normal muscle tone. Gait normal. Coordination normal.  Skin: Positive for hypopigmentation/patches on face only. Skin is warm and dry. Not diaphoretic. No erythema. No pallor.  Psychiatric: Mood, memory and judgment normal.  Vitals reviewed.  LABORATORY DATA: Lab Results  Component Value Date   WBC 5.4 06/18/2021   HGB 10.9 (L) 06/18/2021   HCT 32.2 (L) 06/18/2021   MCV 84.1 06/18/2021   PLT 168 06/18/2021      Chemistry      Component Value Date/Time   NA 136 06/18/2021 1052   K 3.8 06/18/2021 1052   CL 102 06/18/2021 1052   CO2 28 06/18/2021 1052   BUN 14 06/18/2021 1052   CREATININE 1.08 06/18/2021 1052   CREATININE 0.88 02/21/2020 1115      Component Value Date/Time   CALCIUM 8.8 (L) 06/18/2021 1052   ALKPHOS 47 06/18/2021 1052   AST 30 06/18/2021 1052   ALT 26 06/18/2021 1052   BILITOT 0.4 06/18/2021 1052       RADIOGRAPHIC STUDIES:  No results found.   ASSESSMENT/PLAN:  This is a very pleasant 65 year old African-American male diagnosed with stage IV (T3, N2, M1C) non-small cell lung cancer, adenocarcinoma.  He presented with a right upper lobe lung mass in addition to a right lower lobe, left lower lobe lung lesions with right hilar and mediastinal lymphadenopathy.  He also had metastatic disease to the brain.  He was diagnosed in October 2022.  His molecular studies by [employer] did not show any actionable mutations.  The patient underwent SRS treatment to the metastatic brain lesions under the care of Dr. Lisbeth Renshaw.  This was completed on 04/20/2021.  The patient is currently undergoing systemic chemotherapy/immunotherapy.  He started with carboplatin for an AUC of 5, Alimta 500 mg per metered squared, Keytruda 200 mg IV every 3 weeks.  He is status post 20 cycles.  Starting from cycle #5, the patient has been on maintenance treatment with Alimta and Keytruda IV every 3  weeks.   Labs were reivewed. Recommend that he proceed with cycle #21 today as scheduled.   I will arrange for a restaging CT scan prior to starting his next cycle of treatment.   We will see him back for a follow up visit in 3 weeks for evaluation and to review his scan before starting cycle #22  The patient was advised to call immediately if he has any concerning symptoms in the interval. The patient voices understanding of current disease status and treatment options and is in agreement with the current care plan.  All questions were answered. The patient knows to call the clinic with any problems, questions or concerns. We can certainly see the patient much sooner if necessary   Orders Placed This Encounter  Procedures   CT Chest W Contrast    Standing Status:   Future    Standing Expiration Date:   06/18/2022    Order Specific Question:   If indicated for the ordered procedure, I authorize the administration of contrast media per Radiology protocol    Answer:   Yes    Order Specific Question:   Preferred imaging location?    Answer:   [facility]   CT Abdomen Pelvis W Contrast    Standing Status:   Future    Standing Expiration Date:   06/18/2022    Order Specific Question:   If indicated for the ordered procedure, I authorize the administration of contrast media per Radiology protocol    Answer:   Yes    Order Specific Question:   Preferred imaging location?    Answer:   [facility]    Order Specific Question:   Is Oral Contrast requested for this exam?    Answer:   Yes, Per Radiology protocol     The total time spent in the appointment was 20-29 minutes.   Jaden Abreu L Arno Cullers, PA-C 06/18/21

## 2021-06-18 ENCOUNTER — Other Ambulatory Visit: Payer: Self-pay

## 2021-06-18 ENCOUNTER — Inpatient Hospital Stay: Payer: 59 | Attending: Internal Medicine

## 2021-06-18 ENCOUNTER — Inpatient Hospital Stay: Payer: 59

## 2021-06-18 ENCOUNTER — Inpatient Hospital Stay (HOSPITAL_BASED_OUTPATIENT_CLINIC_OR_DEPARTMENT_OTHER): Payer: 59 | Admitting: Physician Assistant

## 2021-06-18 VITALS — BP 104/58 | HR 78 | Temp 97.8°F | Resp 17 | Ht 72.0 in | Wt 165.6 lb

## 2021-06-18 DIAGNOSIS — C7931 Secondary malignant neoplasm of brain: Secondary | ICD-10-CM | POA: Diagnosis not present

## 2021-06-18 DIAGNOSIS — C3491 Malignant neoplasm of unspecified part of right bronchus or lung: Secondary | ICD-10-CM | POA: Diagnosis not present

## 2021-06-18 DIAGNOSIS — C3411 Malignant neoplasm of upper lobe, right bronchus or lung: Secondary | ICD-10-CM | POA: Diagnosis present

## 2021-06-18 DIAGNOSIS — Z5112 Encounter for antineoplastic immunotherapy: Secondary | ICD-10-CM | POA: Diagnosis present

## 2021-06-18 DIAGNOSIS — Z87891 Personal history of nicotine dependence: Secondary | ICD-10-CM | POA: Insufficient documentation

## 2021-06-18 DIAGNOSIS — Z79899 Other long term (current) drug therapy: Secondary | ICD-10-CM | POA: Diagnosis not present

## 2021-06-18 DIAGNOSIS — Z95828 Presence of other vascular implants and grafts: Secondary | ICD-10-CM

## 2021-06-18 DIAGNOSIS — Z5111 Encounter for antineoplastic chemotherapy: Secondary | ICD-10-CM | POA: Diagnosis present

## 2021-06-18 LAB — CBC WITH DIFFERENTIAL (CANCER CENTER ONLY)
Abs Immature Granulocytes: 0.02 10*3/uL (ref 0.00–0.07)
Basophils Absolute: 0 10*3/uL (ref 0.0–0.1)
Basophils Relative: 0 %
Eosinophils Absolute: 0.2 10*3/uL (ref 0.0–0.5)
Eosinophils Relative: 3 %
HCT: 32.2 % — ABNORMAL LOW (ref 39.0–52.0)
Hemoglobin: 10.9 g/dL — ABNORMAL LOW (ref 13.0–17.0)
Immature Granulocytes: 0 %
Lymphocytes Relative: 29 %
Lymphs Abs: 1.6 10*3/uL (ref 0.7–4.0)
MCH: 28.5 pg (ref 26.0–34.0)
MCHC: 33.9 g/dL (ref 30.0–36.0)
MCV: 84.1 fL (ref 80.0–100.0)
Monocytes Absolute: 0.7 10*3/uL (ref 0.1–1.0)
Monocytes Relative: 13 %
Neutro Abs: 3 10*3/uL (ref 1.7–7.7)
Neutrophils Relative %: 55 %
Platelet Count: 168 10*3/uL (ref 150–400)
RBC: 3.83 MIL/uL — ABNORMAL LOW (ref 4.22–5.81)
RDW: 15.9 % — ABNORMAL HIGH (ref 11.5–15.5)
WBC Count: 5.4 10*3/uL (ref 4.0–10.5)
nRBC: 0 % (ref 0.0–0.2)

## 2021-06-18 LAB — CMP (CANCER CENTER ONLY)
ALT: 26 U/L (ref 0–44)
AST: 30 U/L (ref 15–41)
Albumin: 3.5 g/dL (ref 3.5–5.0)
Alkaline Phosphatase: 47 U/L (ref 38–126)
Anion gap: 6 (ref 5–15)
BUN: 14 mg/dL (ref 8–23)
CO2: 28 mmol/L (ref 22–32)
Calcium: 8.8 mg/dL — ABNORMAL LOW (ref 8.9–10.3)
Chloride: 102 mmol/L (ref 98–111)
Creatinine: 1.08 mg/dL (ref 0.61–1.24)
GFR, Estimated: >60 mL/min (ref >60)
Glucose, Bld: 139 mg/dL — ABNORMAL HIGH (ref 70–99)
Potassium: 3.8 mmol/L (ref 3.5–5.1)
Sodium: 136 mmol/L (ref 135–145)
Total Bilirubin: 0.4 mg/dL (ref 0.3–1.2)
Total Protein: 6.8 g/dL (ref 6.5–8.1)

## 2021-06-18 LAB — TSH: TSH: 1.444 u[IU]/mL (ref 0.320–4.118)

## 2021-06-18 MED ORDER — CYANOCOBALAMIN 1000 MCG/ML IJ SOLN
1000.0000 ug | Freq: Once | INTRAMUSCULAR | Status: AC
  Administered 2021-06-18: 1000 ug via INTRAMUSCULAR
  Filled 2021-06-18: qty 1

## 2021-06-18 MED ORDER — SODIUM CHLORIDE 0.9 % IV SOLN
500.0000 mg/m2 | Freq: Once | INTRAVENOUS | Status: AC
  Administered 2021-06-18: 900 mg via INTRAVENOUS
  Filled 2021-06-18: qty 16

## 2021-06-18 MED ORDER — PROCHLORPERAZINE MALEATE 10 MG PO TABS
10.0000 mg | ORAL_TABLET | Freq: Once | ORAL | Status: AC
  Administered 2021-06-18: 10 mg via ORAL
  Filled 2021-06-18: qty 1

## 2021-06-18 MED ORDER — SODIUM CHLORIDE 0.9% FLUSH
10.0000 mL | INTRAVENOUS | Status: DC | PRN
  Administered 2021-06-18: 10 mL

## 2021-06-18 MED ORDER — HEPARIN SOD (PORK) LOCK FLUSH 100 UNIT/ML IV SOLN
500.0000 [IU] | Freq: Once | INTRAVENOUS | Status: AC | PRN
  Administered 2021-06-18: 500 [IU]

## 2021-06-18 MED ORDER — SODIUM CHLORIDE 0.9 % IV SOLN: Freq: Once | INTRAVENOUS | Status: DC

## 2021-06-18 MED ORDER — SODIUM CHLORIDE 0.9 % IV SOLN
200.0000 mg | Freq: Once | INTRAVENOUS | Status: AC
  Administered 2021-06-18: 200 mg via INTRAVENOUS
  Filled 2021-06-18: qty 8

## 2021-06-18 MED ORDER — SODIUM CHLORIDE 0.9 % IV SOLN: Freq: Once | INTRAVENOUS | Status: AC

## 2021-06-18 MED ORDER — SODIUM CHLORIDE 0.9% FLUSH
10.0000 mL | Freq: Once | INTRAVENOUS | Status: AC
  Administered 2021-06-18: 10 mL

## 2021-06-18 NOTE — Patient Instructions (Signed)
[facility] ONCOLOGY  Discharge Instructions: Thank you for choosing [facility] to provide your oncology and hematology care.   If you have a lab appointment with the [facility], please go directly to the [facility] and check in at the registration area.   Wear comfortable clothing and clothing appropriate for easy access to any Portacath or PICC line.   We strive to give you quality time with your provider. You may need to reschedule your appointment if you arrive late (15 or more minutes).  Arriving late affects you and other patients whose appointments are after yours.  Also, if you miss three or more appointments without notifying the office, you may be dismissed from the clinic at the providers discretion.      For prescription refill requests, have your pharmacy contact our office and allow 72 hours for refills to be completed.    Today you received the following chemotherapy and/or immunotherapy agents: Pemetrexed, Pembrolizumab.      To help prevent nausea and vomiting after your treatment, we encourage you to take your nausea medication as directed.  BELOW ARE SYMPTOMS THAT SHOULD BE REPORTED IMMEDIATELY: *FEVER GREATER THAN 100.4 F (38 C) OR HIGHER *CHILLS OR SWEATING *NAUSEA AND VOMITING THAT IS NOT CONTROLLED WITH YOUR NAUSEA MEDICATION *UNUSUAL SHORTNESS OF BREATH *UNUSUAL BRUISING OR BLEEDING *URINARY PROBLEMS (pain or burning when urinating, or frequent urination) *BOWEL PROBLEMS (unusual diarrhea, constipation, pain near the anus) TENDERNESS IN MOUTH AND THROAT WITH OR WITHOUT PRESENCE OF ULCERS (sore throat, sores in mouth, or a toothache) UNUSUAL RASH, SWELLING OR PAIN  UNUSUAL VAGINAL DISCHARGE OR ITCHING   Items with * indicate a potential emergency and should be followed up as soon as possible or go to the Emergency Department if any problems should occur.  Please show the CHEMOTHERAPY ALERT CARD or IMMUNOTHERAPY ALERT  CARD at check-in to the Emergency Department and triage nurse.  Should you have questions after your visit or need to cancel or reschedule your appointment, please contact [facility]  Dept: [phone]  and follow the prompts.  Office hours are 8:00 a.m. to 4:30 p.m. Monday - Friday. Please note that voicemails left after 4:00 p.m. may not be returned until the following business day.  We are closed weekends and major holidays. You have access to a nurse at all times for urgent questions. Please call the main number to the clinic Dept: [phone] and follow the prompts.   For any non-urgent questions, you may also contact your provider using MyChart. We now offer e-Visits for anyone 46 and older to request care online for non-urgent symptoms. For details visit [email or web address].   Also download the MyChart app! Go to the app store, search "MyChart", open the app, select [facility], and log in with your MyChart username and password.  Due to Covid, a mask is required upon entering the hospital/clinic. If you do not have a mask, one will be given to you upon arrival. For doctor visits, patients may have 1 support person aged 10 or older with them. For treatment visits, patients cannot have anyone with them due to current Covid guidelines and our immunocompromised population.

## 2021-06-22 ENCOUNTER — Other Ambulatory Visit: Payer: Self-pay | Admitting: Internal Medicine

## 2021-06-22 NOTE — Telephone Encounter (Signed)
Please refill as per office routine med refill policy (all routine meds to be refilled for 3 mo or monthly (per pt preference) up to one year from last visit, then month to month grace period for 3 mo, then further med refills will have to be denied) ? ?

## 2021-06-26 ENCOUNTER — Encounter: Payer: Self-pay | Admitting: Internal Medicine

## 2021-06-26 NOTE — Progress Notes (Signed)
Called pt regarding copay assistance for [place] for 2023.  I left a msg requesting he return my call if he's interested in reapplying for assistance.

## 2021-07-05 ENCOUNTER — Encounter (HOSPITAL_COMMUNITY): Payer: Self-pay

## 2021-07-05 ENCOUNTER — Other Ambulatory Visit: Payer: Self-pay

## 2021-07-05 ENCOUNTER — Emergency Department (HOSPITAL_COMMUNITY)
Admission: RE | Admit: 2021-07-05 | Discharge: 2021-07-05 | Disposition: A | Discharge status: Home / Self Care | Payer: 59 | Source: Ambulatory Visit | Attending: Physician Assistant | Admitting: Physician Assistant

## 2021-07-05 ENCOUNTER — Encounter (HOSPITAL_COMMUNITY): Payer: Self-pay | Admitting: Emergency Medicine

## 2021-07-05 ENCOUNTER — Emergency Department (HOSPITAL_COMMUNITY)
Admission: EM | Admit: 2021-07-05 | Discharge: 2021-07-05 | Disposition: A | Discharge status: Home / Self Care | Payer: 59 | Attending: Emergency Medicine | Admitting: Emergency Medicine

## 2021-07-05 ENCOUNTER — Telehealth: Payer: Self-pay | Admitting: Medical Oncology

## 2021-07-05 DIAGNOSIS — C3491 Malignant neoplasm of unspecified part of right bronchus or lung: Secondary | ICD-10-CM | POA: Insufficient documentation

## 2021-07-05 DIAGNOSIS — Z7982 Long term (current) use of aspirin: Secondary | ICD-10-CM | POA: Diagnosis not present

## 2021-07-05 DIAGNOSIS — Z79899 Other long term (current) drug therapy: Secondary | ICD-10-CM | POA: Insufficient documentation

## 2021-07-05 DIAGNOSIS — R1084 Generalized abdominal pain: Secondary | ICD-10-CM | POA: Diagnosis not present

## 2021-07-05 LAB — COMPREHENSIVE METABOLIC PANEL
ALT: 31 U/L (ref 0–44)
AST: 33 U/L (ref 15–41)
Albumin: 3.6 g/dL (ref 3.5–5.0)
Alkaline Phosphatase: 47 U/L (ref 38–126)
Anion gap: 7 (ref 5–15)
BUN: 16 mg/dL (ref 8–23)
CO2: 25 mmol/L (ref 22–32)
Calcium: 8.6 mg/dL — ABNORMAL LOW (ref 8.9–10.3)
Chloride: 102 mmol/L (ref 98–111)
Creatinine, Ser: 1.13 mg/dL (ref 0.61–1.24)
GFR, Estimated: >60 mL/min (ref >60)
Glucose, Bld: 115 mg/dL — ABNORMAL HIGH (ref 70–99)
Potassium: 4 mmol/L (ref 3.5–5.1)
Sodium: 134 mmol/L — ABNORMAL LOW (ref 135–145)
Total Bilirubin: 0.4 mg/dL (ref 0.3–1.2)
Total Protein: 7.1 g/dL (ref 6.5–8.1)

## 2021-07-05 LAB — CBC
HCT: 35.7 % — ABNORMAL LOW (ref 39.0–52.0)
Hemoglobin: 12 g/dL — ABNORMAL LOW (ref 13.0–17.0)
MCH: 28.8 pg (ref 26.0–34.0)
MCHC: 33.6 g/dL (ref 30.0–36.0)
MCV: 85.6 fL (ref 80.0–100.0)
Platelets: 150 10*3/uL (ref 150–400)
RBC: 4.17 MIL/uL — ABNORMAL LOW (ref 4.22–5.81)
RDW: 16.9 % — ABNORMAL HIGH (ref 11.5–15.5)
WBC: 9.2 10*3/uL (ref 4.0–10.5)
nRBC: 0 % (ref 0.0–0.2)

## 2021-07-05 LAB — LIPASE, BLOOD: Lipase: 23 U/L (ref 11–51)

## 2021-07-05 MED ORDER — HEPARIN SOD (PORK) LOCK FLUSH 100 UNIT/ML IV SOLN
500.0000 [IU] | Freq: Once | INTRAVENOUS | Status: AC
  Administered 2021-07-05: 500 [IU]
  Filled 2021-07-05: qty 5

## 2021-07-05 MED ORDER — SODIUM CHLORIDE (PF) 0.9 % IJ SOLN
INTRAMUSCULAR | Status: AC
  Filled 2021-07-05: qty 50

## 2021-07-05 MED ORDER — HEPARIN SOD (PORK) LOCK FLUSH 100 UNIT/ML IV SOLN
INTRAVENOUS | Status: AC
  Filled 2021-07-05: qty 5

## 2021-07-05 MED ORDER — IOHEXOL 300 MG/ML  SOLN
100.0000 mL | Freq: Once | INTRAMUSCULAR | Status: AC | PRN
  Administered 2021-07-05: 100 mL via INTRAVENOUS

## 2021-07-05 MED ORDER — HEPARIN SOD (PORK) LOCK FLUSH 100 UNIT/ML IV SOLN
500.0000 [IU] | Freq: Once | INTRAVENOUS | Status: AC
  Administered 2021-07-05: 500 [IU] via INTRAVENOUS

## 2021-07-05 NOTE — Telephone Encounter (Signed)
CT findings-Pt contacted and he reports mild mid abdominal pain and a little diarrhea this morning. Just had CT . Pt instructed to go to ED . Brianna @ [facility]  notified that pt is coming to ED.

## 2021-07-05 NOTE — ED Provider Triage Note (Signed)
Emergency Medicine Provider Triage Evaluation Note  Nicolas Allen , a 66 y.o. male  was evaluated in triage.  Pt complains of abdominal pain. He states that same began yesterday morning with some associated diarrhea. Pain is generalized throughout his abdomen. Lung cancer patient actively receiving chemo. Last infusion 1/3, next infusion 1/22. Had a CT chest, abdomen, and pelvis this morning that was concerning for enteritis with 'haziness surrounding the appendix; however, this is favored related to the generalized increase in intraperitoneal free fluid rather than acute appendicitis. Recommend clinical correlation for appendicitis.' He was therefore referred to the ED for further evaluation. Review of Systems  Positive: Generalized abdominal pain, diarrhea Negative: Fevers, chills, n/v  Physical Exam  BP 111/71 (BP Location: Left Arm)    Pulse (!) 119    Temp 97.9 F (36.6 C) (Oral)    Resp 18    Ht 6' (1.829 m)    Wt 75 kg    SpO2 100%    BMI 22.42 kg/m  Gen:   Awake, no distress   Resp:  Normal effort  MSK:   Moves extremities without difficulty  Other:  Generalized tenderness noted throughout R side of abdomen with some distention  Medical Decision Making  Medically screening exam initiated at 1:05 PM.  Appropriate orders placed.  Nicolas Allen was informed that the remainder of the evaluation will be completed by another provider, this initial triage assessment does not replace that evaluation, and the importance of remaining in the ED until their evaluation is complete.     Bud Face, PA-C 07/05/21 1317

## 2021-07-05 NOTE — Telephone Encounter (Signed)
CT -called with concerning findings.  "Small bowel inflammation. New IP free fluid concerning for enteritis. Haziness surrounds appendix ,favored related to increase in free fluid, but recommend  clinical coorilation for appendicitis".

## 2021-07-05 NOTE — ED Triage Notes (Signed)
Patient referred from cancer center to ED to rule out appendicitis. Pt reports abdominal pain since yesterday morning. Reports diarrhea. Denies N/V. Reports sweating this morning but denies fevers.

## 2021-07-05 NOTE — Consult Note (Signed)
Nicolas Allen 09-May-1956  [id number].    Requesting MD: Dr. Lacretia Leigh Chief Complaint/Reason for Consult: abdominal pain, rule out appendicitis  HPI:  This is a pleasant 66 year old male with a history of stage IV lung cancer.  He was scheduled today for an outpatient CT scan of his chest abdomen and pelvis for oncologic reasons.  He states he began having some vague abdominal pain yesterday morning.  He states that it was a little bit crampy.  He denied any fevers, nausea, vomiting, or diarrhea.  He continued to be able to eat some.  His pain got better throughout the day yesterday and then was slightly worse last night again as well.  He got up this morning and felt a little lightheaded after drinking his first bottle of contrast.  This went away and he was able to drink the second without any issues.  He is slightly "sore" but no further abdominal pain.  His CT scan revealed a long segment of small bowel inflammation/submucosal edema within the upper pelvis with new intraperitoneal free fluid beneath the hemidiaphragms and along the pericolic gutters and within the pelvis.  These findings are concerning for enteritis.  There was also some haziness noted surrounding the appendix however this was favored to be related to a generalized increase in intraperitoneal free fluid rather than acute appendicitis.  However, because of this finding he was requested to come to the emergency room for surgical evaluation.  Upon arrival here the patient's vital signs are stable with no fever.  His labs are unremarkable with a normal white blood cell count.  ROS: ROS: Please see HPI, otherwise all other systems have been reviewed and are currently negative.  Family History  Problem Relation Age of Onset   Cancer Mother        esophagus   Cancer Cousin    Stroke Other    Hypertension Other    Colon cancer Neg Hx     Past Medical History:  Diagnosis Date   Anemia 06/24/2012   ANXIETY 02/03/2007    Cervical disc disease 02/12/2012   S/p surgury jan 2013   Erectile dysfunction 02/11/2011   GERD (gastroesophageal reflux disease)    GLUCOSE INTOLERANCE 01/04/2008   HYPERLIPIDEMIA 02/03/2007   HYPERTENSION 02/03/2007   IBS (irritable bowel syndrome)    Impaired glucose tolerance 02/11/2011   nscl ca 03/30/2020   Smoker 08/22/2014   Substance abuse ([place]) 2001   sober for 16 yrs    Past Surgical History:  Procedure Laterality Date   BRONCHIAL BIOPSY  03/30/2020   Procedure: BRONCHIAL BIOPSIES;  Surgeon: Garner Nash, DO;  Location: [facility];  Service: Pulmonary;;   BRONCHIAL BRUSHINGS  03/30/2020   Procedure: BRONCHIAL BRUSHINGS;  Surgeon: Garner Nash, DO;  Location: [facility];  Service: Pulmonary;;   BRONCHIAL NEEDLE ASPIRATION BIOPSY  03/30/2020   Procedure: BRONCHIAL NEEDLE ASPIRATION BIOPSIES;  Surgeon: Garner Nash, DO;  Location: [facility];  Service: Pulmonary;;   BRONCHIAL WASHINGS  03/30/2020   Procedure: BRONCHIAL WASHINGS;  Surgeon: Garner Nash, DO;  Location: [facility];  Service: Pulmonary;;   COLONOSCOPY  2007   IR IMAGING GUIDED PORT INSERTION  04/23/2020   neck fusion  2012   C4   NO PAST SURGERIES     VIDEO BRONCHOSCOPY WITH ENDOBRONCHIAL NAVIGATION N/A 03/30/2020   Procedure: VIDEO BRONCHOSCOPY WITH ENDOBRONCHIAL NAVIGATION;  Surgeon: Garner Nash, DO;  Location: [facility];  Service: Pulmonary;  Laterality: N/A;  VIDEO BRONCHOSCOPY WITH ENDOBRONCHIAL ULTRASOUND N/A 03/30/2020   Procedure: VIDEO BRONCHOSCOPY WITH ENDOBRONCHIAL ULTRASOUND;  Surgeon: Garner Nash, DO;  Location: [facility];  Service: Pulmonary;  Laterality: N/A;    Social History:  reports that he quit smoking about 16 months ago. His smoking use included cigarettes. He has a 45.00 pack-year smoking history. He has quit using smokeless tobacco.  His smokeless tobacco use included chew. He reports that he does not drink alcohol and does not use  drugs.  Allergies: No Known Allergies  (Not in a hospital admission)    Physical Exam: Blood pressure 115/65, pulse 68, temperature 97.9 F (36.6 C), temperature source Oral, resp. rate 18, height 6' (1.829 m), weight 75 kg, SpO2 98 %. General: pleasant, WD, WN male who is laying in bed in NAD HEENT: head is normocephalic, atraumatic.  Sclera are noninjected.  PERRL.  Ears and nose without any masses or lesions.  Mouth is pink and moist Heart: regular, rate, and rhythm.  Normal s1,s2. No obvious murmurs, gallops, or rubs noted.  Palpable radial and pedal pulses bilaterally Lungs: CTAB, no wheezes, rhonchi, or rales noted.  Respiratory effort nonlabored Abd: soft, minimal suprapubic and left lower quadrant discomfort but completely nontender in the right lower quadrant., ND, +BS, no masses, hernias, or organomegaly MS: all 4 extremities are symmetrical with no cyanosis, clubbing, or edema. Skin: warm and dry with no masses, lesions, or rashes Neuro: Cranial nerves 2-12 grossly intact, sensation is normal throughout Psych: A&Ox3 with an appropriate affect.   Results for orders placed or performed during the hospital encounter of 07/05/21 (from the past 48 hour(s))  Lipase, blood     Status: None   Collection Time: 07/05/21 12:29 PM  Result Value Ref Range   Lipase 23 11 - 51 U/L    Comment: Performed at [facility], [place] [address], [place], [place] [address]  Comprehensive metabolic panel     Status: Abnormal   Collection Time: 07/05/21 12:29 PM  Result Value Ref Range   Sodium 134 (L) 135 - 145 mmol/L   Potassium 4.0 3.5 - 5.1 mmol/L   Chloride 102 98 - 111 mmol/L   CO2 25 22 - 32 mmol/L   Glucose, Bld 115 (H) 70 - 99 mg/dL    Comment: Glucose reference range applies only to samples taken after fasting for at least 8 hours.   BUN 16 8 - 23 mg/dL   Creatinine, Ser 1.13 0.61 - 1.24 mg/dL   Calcium 8.6 (L) 8.9 - 10.3 mg/dL   Total Protein 7.1 6.5 - 8.1 g/dL    Albumin 3.6 3.5 - 5.0 g/dL   AST 33 15 - 41 U/L   ALT 31 0 - 44 U/L   Alkaline Phosphatase 47 38 - 126 U/L   Total Bilirubin 0.4 0.3 - 1.2 mg/dL   GFR, Estimated >60 >60 mL/min    Comment: (NOTE) Calculated using the CKD-EPI Creatinine Equation (2021)    Anion gap 7 5 - 15    Comment: Performed at [facility], [place] [address], [place], [place] [address]  CBC     Status: Abnormal   Collection Time: 07/05/21 12:29 PM  Result Value Ref Range   WBC 9.2 4.0 - 10.5 K/uL   RBC 4.17 (L) 4.22 - 5.81 MIL/uL   Hemoglobin 12.0 (L) 13.0 - 17.0 g/dL   HCT 35.7 (L) 39.0 - 52.0 %   MCV 85.6 80.0 - 100.0 fL   MCH 28.8  26.0 - 34.0 pg   MCHC 33.6 30.0 - 36.0 g/dL   RDW 16.9 (H) 11.5 - 15.5 %   Platelets 150 150 - 400 K/uL   nRBC 0.0 0.0 - 0.2 %    Comment: Performed at [facility], [place] [address], [place], [place] [address]   CT Chest W Contrast  Result Date: 07/05/2021 CLINICAL DATA:  Primary Cancer Type: Lung Imaging Indication: Assess response to therapy Interval therapy since last imaging? Yes Initial Cancer Diagnosis Date: 03/30/2020; Established by: Biopsy-proven Detailed Pathology: Stage IV non-small cell lung cancer favoring adenocarcinoma. Primary Tumor location:  Right upper lobe. Brain metastases. Surgeries: No. Chemotherapy: Yes; Ongoing? Yes; Most recent administration: 06/18/2021 Immunotherapy?  Yes; Type: Keytruda; Ongoing? Yes Radiation therapy?  Yes; Date Range: 04/20/2020; Target: Brain. EXAM: CT CHEST, ABDOMEN, AND PELVIS WITH CONTRAST TECHNIQUE: Multidetector CT imaging of the chest, abdomen and pelvis was performed following the standard protocol during bolus administration of intravenous contrast. RADIATION DOSE REDUCTION: This exam was performed according to the departmental dose-optimization program which includes automated exposure control, adjustment of the mA and/or kV according to patient size and/or use of iterative reconstruction  technique. CONTRAST:  175mL OMNIPAQUE IOHEXOL 300 MG/ML  SOLN COMPARISON:  Most recent CT chest, abdomen and pelvis 05/06/2021. 04/02/2020 PET-CT. FINDINGS: CT CHEST FINDINGS Cardiovascular: Port in the anterior chest wall with tip in distal SVC. No significant vascular findings. Normal heart size. No pericardial effusion. Mediastinum/Nodes: No axillary or supraclavicular adenopathy. No mediastinal or hilar adenopathy. No pericardial fluid. Esophagus normal. Lungs/Pleura: Linear thickening in the RIGHT upper lobe lung cancer treatment site measures 4.5 x 1.1 cm compared to 3.7 x 1.1 cm. Visually lesion looks unchanged. Nodule in the RIGHT lung apex measuring 12 mm compared to 13 mm (image 38/4). Sub solid nodule in the RIGHT lower lobe measures 23 mm compared to 25 mm (image 89/4) mix cystic and solid nodule in the LEFT lower lobe measures 2.8 cm compared to 3.2 cm. Nodular component measures 15 mm (image 117/4 compared to 17 mm. No new nodularity. Musculoskeletal: Sclerotic lesion in the lower thoracic spine measuring 10 mm unchanged from prior (image 136/4). CT ABDOMEN AND PELVIS FINDINGS Hepatobiliary: No focal hepatic lesion. No biliary ductal dilatation. Gallbladder is normal. Common bile duct is normal. Pancreas: Pancreas is normal. No ductal dilatation. No pancreatic inflammation. Spleen: Normal spleen Adrenals/urinary tract: Adrenal glands and kidneys are normal. The ureters and bladder normal. Stomach/Bowel: Stomach duodenum normal. Proximal small bowel is normal. There is a loop of small bowel in the upper pelvis which demonstrates submucosal edema/bowel wall thickening (image 105/2. There is luminal narrowing associated with submucosal edema. The more distal small bowel leading up to the terminal ileum appears normal. Appendix is normal caliber however there is small amount of haziness surrounding the appendix within the fat (image 95/2. Mild thickening along the pericolic gutter on the RIGHT (image 92/2.  This is favored related to more generalized increase in intraperitoneal free fluid. Free fluid noted along the RIGHT hepatic margin, LEFT pericolic gutter, beneath the LEFT hemidiaphragm and in the pelvis. The ascending, transverse and descending colon normal. Rectosigmoid colon normal. Vascular/Lymphatic: Abdominal aorta is normal caliber with atherosclerotic calcification. There is no retroperitoneal or periportal lymphadenopathy. No pelvic lymphadenopathy. Reproductive: Prostate unremarkable Other: Free fluid as described in GI section Musculoskeletal: No aggressive osseous lesion. IMPRESSION: Chest Impression: 1. Stable treatment site in the RIGHT upper lobe. 2. Bilateral solid and subsolid pulmonary nodules not changed significantly in size comparison exam.  Nodule remain concerning for multifocal adenocarcinoma. 3. No mediastinal lymphadenopathy. Abdomen / Pelvis Impression: 1. Long segment small bowel inflammation/submucosal edema within the upper pelvis. There is new intraperitoneal free fluid beneath the hemidiaphragms, along the pericolic gutters, and within the pelvis. Findings concerning for enteritis with associated free fluid. Differential enteritis would include including chemo toxicity or immuno toxicity, infectious enteritis, or inflammatory disease. Ischemia not favored. 2. There is haziness surrounding the appendix; however, this is favored related to the generalized increase in intraperitoneal free fluid rather than acute appendicitis. Recommend clinical correlation for appendicitis. These results will be called to the ordering clinician or representative by the Radiologist Assistant, and communication documented in the PACS or [employer]. Electronically Signed   By: Suzy Bouchard M.D.   On: 07/05/2021 11:18   CT Abdomen Pelvis W Contrast  Result Date: 07/05/2021 CLINICAL DATA:  Primary Cancer Type: Lung Imaging Indication: Assess response to therapy Interval therapy since last  imaging? Yes Initial Cancer Diagnosis Date: 03/30/2020; Established by: Biopsy-proven Detailed Pathology: Stage IV non-small cell lung cancer favoring adenocarcinoma. Primary Tumor location:  Right upper lobe. Brain metastases. Surgeries: No. Chemotherapy: Yes; Ongoing? Yes; Most recent administration: 06/18/2021 Immunotherapy?  Yes; Type: Keytruda; Ongoing? Yes Radiation therapy?  Yes; Date Range: 04/20/2020; Target: Brain. EXAM: CT CHEST, ABDOMEN, AND PELVIS WITH CONTRAST TECHNIQUE: Multidetector CT imaging of the chest, abdomen and pelvis was performed following the standard protocol during bolus administration of intravenous contrast. RADIATION DOSE REDUCTION: This exam was performed according to the departmental dose-optimization program which includes automated exposure control, adjustment of the mA and/or kV according to patient size and/or use of iterative reconstruction technique. CONTRAST:  131mL OMNIPAQUE IOHEXOL 300 MG/ML  SOLN COMPARISON:  Most recent CT chest, abdomen and pelvis 05/06/2021. 04/02/2020 PET-CT. FINDINGS: CT CHEST FINDINGS Cardiovascular: Port in the anterior chest wall with tip in distal SVC. No significant vascular findings. Normal heart size. No pericardial effusion. Mediastinum/Nodes: No axillary or supraclavicular adenopathy. No mediastinal or hilar adenopathy. No pericardial fluid. Esophagus normal. Lungs/Pleura: Linear thickening in the RIGHT upper lobe lung cancer treatment site measures 4.5 x 1.1 cm compared to 3.7 x 1.1 cm. Visually lesion looks unchanged. Nodule in the RIGHT lung apex measuring 12 mm compared to 13 mm (image 38/4). Sub solid nodule in the RIGHT lower lobe measures 23 mm compared to 25 mm (image 89/4) mix cystic and solid nodule in the LEFT lower lobe measures 2.8 cm compared to 3.2 cm. Nodular component measures 15 mm (image 117/4 compared to 17 mm. No new nodularity. Musculoskeletal: Sclerotic lesion in the lower thoracic spine measuring 10 mm unchanged from  prior (image 136/4). CT ABDOMEN AND PELVIS FINDINGS Hepatobiliary: No focal hepatic lesion. No biliary ductal dilatation. Gallbladder is normal. Common bile duct is normal. Pancreas: Pancreas is normal. No ductal dilatation. No pancreatic inflammation. Spleen: Normal spleen Adrenals/urinary tract: Adrenal glands and kidneys are normal. The ureters and bladder normal. Stomach/Bowel: Stomach duodenum normal. Proximal small bowel is normal. There is a loop of small bowel in the upper pelvis which demonstrates submucosal edema/bowel wall thickening (image 105/2. There is luminal narrowing associated with submucosal edema. The more distal small bowel leading up to the terminal ileum appears normal. Appendix is normal caliber however there is small amount of haziness surrounding the appendix within the fat (image 95/2. Mild thickening along the pericolic gutter on the RIGHT (image 92/2. This is favored related to more generalized increase in intraperitoneal free fluid. Free fluid noted along the RIGHT hepatic margin,  LEFT pericolic gutter, beneath the LEFT hemidiaphragm and in the pelvis. The ascending, transverse and descending colon normal. Rectosigmoid colon normal. Vascular/Lymphatic: Abdominal aorta is normal caliber with atherosclerotic calcification. There is no retroperitoneal or periportal lymphadenopathy. No pelvic lymphadenopathy. Reproductive: Prostate unremarkable Other: Free fluid as described in GI section Musculoskeletal: No aggressive osseous lesion. IMPRESSION: Chest Impression: 1. Stable treatment site in the RIGHT upper lobe. 2. Bilateral solid and subsolid pulmonary nodules not changed significantly in size comparison exam. Nodule remain concerning for multifocal adenocarcinoma. 3. No mediastinal lymphadenopathy. Abdomen / Pelvis Impression: 1. Long segment small bowel inflammation/submucosal edema within the upper pelvis. There is new intraperitoneal free fluid beneath the hemidiaphragms, along the  pericolic gutters, and within the pelvis. Findings concerning for enteritis with associated free fluid. Differential enteritis would include including chemo toxicity or immuno toxicity, infectious enteritis, or inflammatory disease. Ischemia not favored. 2. There is haziness surrounding the appendix; however, this is favored related to the generalized increase in intraperitoneal free fluid rather than acute appendicitis. Recommend clinical correlation for appendicitis. These results will be called to the ordering clinician or representative by the Radiologist Assistant, and communication documented in the PACS or [employer]. Electronically Signed   By: Suzy Bouchard M.D.   On: 07/05/2021 11:18      Assessment/Plan Abdominal pain, possible enteritis with new intraperitoneal free fluid and stranding around the appendix The patient's labs, imaging, and chart have been reviewed.  The patient has been evaluated.  The patient currently feels well and is not complaining of really any significant abdominal pain.  His story is not terribly consistent with appendicitis.  I would expect at this point after 36 hours of duration that if he had appendicitis he would be tender in the right lower quadrant with an elevation in his white blood cell count and more definitive findings on his CT scan.  I agree with the CT scan findings that the haziness surrounding his appendix is more than likely related to the new free fluid that is noted in his pelvis.  Given these findings, I do not think the patient requires admission for appendectomy or for further evaluation as he is feeling well.  We did discuss return precautions such as worsening abdominal pain specifically in the right lower quadrant, fevers, nausea and vomiting, he should return to the emergency department for repeat evaluation.  He and his wife understood and agreed with this plan.  This was then discussed with the emergency room physician.   Moderate  Medical Decision Making  Henreitta Cea, [facility] Surgery 07/05/2021, 4:53 PM Please see Amion for pager number during day hours 7:00am-4:30pm or 7:00am -11:30am on weekends

## 2021-07-05 NOTE — ED Provider Notes (Signed)
[facility] DEPT Provider Note   CSN: [id number] Arrival date & time: 07/05/21  1221     History  Chief Complaint  Patient presents with   Abdominal Pain    Clell JERIMY JOHANSON is a 66 y.o. male.  66 year old male presents with abdominal discomfort which started yesterday.  Has had crampy pain without fever, emesis, diarrhea.  No urinary symptoms.  Went to the cancer center today for routine visit and was sent for outpatient blood work as well as abdominal CT.  Abdominal CT showed finding concerning for possible early appendicitis.  He was sent to the ED for further evaluation      Home Medications Prior to Admission medications   Medication Sig Start Date End Date Taking? Authorizing Provider  amLODipine-benazepril (LOTREL) 10-20 MG capsule Take 1 capsule by mouth once daily 06/24/21   [name], MD  aspirin 325 MG EC tablet Take 1 tablet (325 mg total) by mouth daily. 04/08/21   [name], MD  b complex vitamins tablet Take 1 tablet by mouth daily.    [provider]  cholecalciferol (VITAMIN D3) 25 MCG (1000 UNIT) tablet Take 1,000 Units by mouth daily.    [provider]  folic acid (FOLVITE) 1 MG tablet Take 1 tablet (1 mg total) by mouth daily. 04/29/21   [name], MD  Garlic 2774 MG CAPS Take 1,000 mg by mouth daily.     [provider]  ibuprofen (ADVIL) 200 MG tablet Take 200 mg by mouth every 6 (six) hours as needed.    [provider]  lidocaine-prilocaine (EMLA) cream Apply to the Port-A-Cath site 30-60 minutes before chemotherapy. 04/17/20   Curt Bears, MD  Omega-3 Fatty Acids (FISH OIL PO) Take 1 capsule by mouth daily.     [provider]  polyethylene glycol (MIRALAX / GLYCOLAX) 17 g packet Take 17 g by mouth daily as needed.    [provider]  prochlorperazine (COMPAZINE) 10 MG tablet Take 1 tablet (10 mg total) by mouth every 6 (six) hours as needed for nausea or  vomiting. 11/20/20   Curt Bears, MD  rosuvastatin (CRESTOR) 20 MG tablet Take 1 tablet (20 mg total) by mouth daily. 02/23/21   [name], MD  vardenafil (LEVITRA) 20 MG tablet TAKE 1 TABLET BY MOUTH AS NEEDED FOR  ERECTILE  DYSFUNCTION 02/24/20   [name], MD      Allergies    Patient has no known allergies.    Review of Systems   Review of Systems  All other systems reviewed and are negative.  Physical Exam Updated Vital Signs BP 111/71 (BP Location: Left Arm)    Pulse (!) 119    Temp 97.9 F (36.6 C) (Oral)    Resp 18    Ht 1.829 m (6')    Wt 75 kg    SpO2 100%    BMI 22.42 kg/m  Physical Exam Vitals and nursing note reviewed.  Constitutional:      General: He is not in acute distress.    Appearance: Normal appearance. He is well-developed. He is not toxic-appearing.  HENT:     Head: Normocephalic and atraumatic.  Eyes:     General: Lids are normal.     Conjunctiva/sclera: Conjunctivae normal.     Pupils: Pupils are equal, round, and reactive to light.  Neck:     Thyroid: No thyroid mass.     Trachea: No tracheal deviation.  Cardiovascular:  Rate and Rhythm: Normal rate and regular rhythm.     Heart sounds: Normal heart sounds. No murmur heard.   No gallop.  Pulmonary:     Effort: Pulmonary effort is normal. No respiratory distress.     Breath sounds: Normal breath sounds. No stridor. No decreased breath sounds, wheezing, rhonchi or rales.  Abdominal:     General: There is no distension.     Palpations: Abdomen is soft.     Tenderness: There is no abdominal tenderness. There is no guarding or rebound.    Musculoskeletal:        General: No tenderness. Normal range of motion.     Cervical back: Normal range of motion and neck supple.  Skin:    General: Skin is warm and dry.     Findings: No abrasion or rash.  Neurological:     Mental Status: He is alert and oriented to person, place, and time. Mental status is at baseline.     GCS: GCS eye subscore  is 4. GCS verbal subscore is 5. GCS motor subscore is 6.     Cranial Nerves: No cranial nerve deficit.     Sensory: No sensory deficit.     Motor: Motor function is intact.  Psychiatric:        Attention and Perception: Attention normal.        Speech: Speech normal.        Behavior: Behavior normal.    ED Results / Procedures / Treatments   Labs (all labs ordered are listed, but only abnormal results are displayed) Labs Reviewed  COMPREHENSIVE METABOLIC PANEL - Abnormal; Notable for the following components:      Result Value   Sodium 134 (*)    Glucose, Bld 115 (*)    Calcium 8.6 (*)    All other components within normal limits  CBC - Abnormal; Notable for the following components:   RBC 4.17 (*)    Hemoglobin 12.0 (*)    HCT 35.7 (*)    RDW 16.9 (*)    All other components within normal limits  LIPASE, BLOOD  URINALYSIS, ROUTINE W REFLEX MICROSCOPIC    EKG None  ED ECG REPORT   Date: 07/05/2021  Rate: 70  Rhythm: normal sinus rhythm  QRS Axis: normal  Intervals: normal  ST/T Wave abnormalities: nonspecific ST changes  Conduction Disutrbances:none  Narrative Interpretation:   Old EKG Reviewed: none available  I have personally reviewed the EKG tracing and agree with the computerized printout as noted.   Radiology CT Chest W Contrast  Result Date: 07/05/2021 CLINICAL DATA:  Primary Cancer Type: Lung Imaging Indication: Assess response to therapy Interval therapy since last imaging? Yes Initial Cancer Diagnosis Date: 03/30/2020; Established by: Biopsy-proven Detailed Pathology: Stage IV non-small cell lung cancer favoring adenocarcinoma. Primary Tumor location:  Right upper lobe. Brain metastases. Surgeries: No. Chemotherapy: Yes; Ongoing? Yes; Most recent administration: 06/18/2021 Immunotherapy?  Yes; Type: Keytruda; Ongoing? Yes Radiation therapy?  Yes; Date Range: 04/20/2020; Target: Brain. EXAM: CT CHEST, ABDOMEN, AND PELVIS WITH CONTRAST TECHNIQUE:  Multidetector CT imaging of the chest, abdomen and pelvis was performed following the standard protocol during bolus administration of intravenous contrast. RADIATION DOSE REDUCTION: This exam was performed according to the departmental dose-optimization program which includes automated exposure control, adjustment of the mA and/or kV according to patient size and/or use of iterative reconstruction technique. CONTRAST:  17mL OMNIPAQUE IOHEXOL 300 MG/ML  SOLN COMPARISON:  Most recent CT chest, abdomen and pelvis 05/06/2021. 04/02/2020  PET-CT. FINDINGS: CT CHEST FINDINGS Cardiovascular: Port in the anterior chest wall with tip in distal SVC. No significant vascular findings. Normal heart size. No pericardial effusion. Mediastinum/Nodes: No axillary or supraclavicular adenopathy. No mediastinal or hilar adenopathy. No pericardial fluid. Esophagus normal. Lungs/Pleura: Linear thickening in the RIGHT upper lobe lung cancer treatment site measures 4.5 x 1.1 cm compared to 3.7 x 1.1 cm. Visually lesion looks unchanged. Nodule in the RIGHT lung apex measuring 12 mm compared to 13 mm (image 38/4). Sub solid nodule in the RIGHT lower lobe measures 23 mm compared to 25 mm (image 89/4) mix cystic and solid nodule in the LEFT lower lobe measures 2.8 cm compared to 3.2 cm. Nodular component measures 15 mm (image 117/4 compared to 17 mm. No new nodularity. Musculoskeletal: Sclerotic lesion in the lower thoracic spine measuring 10 mm unchanged from prior (image 136/4). CT ABDOMEN AND PELVIS FINDINGS Hepatobiliary: No focal hepatic lesion. No biliary ductal dilatation. Gallbladder is normal. Common bile duct is normal. Pancreas: Pancreas is normal. No ductal dilatation. No pancreatic inflammation. Spleen: Normal spleen Adrenals/urinary tract: Adrenal glands and kidneys are normal. The ureters and bladder normal. Stomach/Bowel: Stomach duodenum normal. Proximal small bowel is normal. There is a loop of small bowel in the upper  pelvis which demonstrates submucosal edema/bowel wall thickening (image 105/2. There is luminal narrowing associated with submucosal edema. The more distal small bowel leading up to the terminal ileum appears normal. Appendix is normal caliber however there is small amount of haziness surrounding the appendix within the fat (image 95/2. Mild thickening along the pericolic gutter on the RIGHT (image 92/2. This is favored related to more generalized increase in intraperitoneal free fluid. Free fluid noted along the RIGHT hepatic margin, LEFT pericolic gutter, beneath the LEFT hemidiaphragm and in the pelvis. The ascending, transverse and descending colon normal. Rectosigmoid colon normal. Vascular/Lymphatic: Abdominal aorta is normal caliber with atherosclerotic calcification. There is no retroperitoneal or periportal lymphadenopathy. No pelvic lymphadenopathy. Reproductive: Prostate unremarkable Other: Free fluid as described in GI section Musculoskeletal: No aggressive osseous lesion. IMPRESSION: Chest Impression: 1. Stable treatment site in the RIGHT upper lobe. 2. Bilateral solid and subsolid pulmonary nodules not changed significantly in size comparison exam. Nodule remain concerning for multifocal adenocarcinoma. 3. No mediastinal lymphadenopathy. Abdomen / Pelvis Impression: 1. Long segment small bowel inflammation/submucosal edema within the upper pelvis. There is new intraperitoneal free fluid beneath the hemidiaphragms, along the pericolic gutters, and within the pelvis. Findings concerning for enteritis with associated free fluid. Differential enteritis would include including chemo toxicity or immuno toxicity, infectious enteritis, or inflammatory disease. Ischemia not favored. 2. There is haziness surrounding the appendix; however, this is favored related to the generalized increase in intraperitoneal free fluid rather than acute appendicitis. Recommend clinical correlation for appendicitis. These results  will be called to the ordering clinician or representative by the Radiologist Assistant, and communication documented in the PACS or [employer]. Electronically Signed   By: Suzy Bouchard M.D.   On: 07/05/2021 11:18   CT Abdomen Pelvis W Contrast  Result Date: 07/05/2021 CLINICAL DATA:  Primary Cancer Type: Lung Imaging Indication: Assess response to therapy Interval therapy since last imaging? Yes Initial Cancer Diagnosis Date: 03/30/2020; Established by: Biopsy-proven Detailed Pathology: Stage IV non-small cell lung cancer favoring adenocarcinoma. Primary Tumor location:  Right upper lobe. Brain metastases. Surgeries: No. Chemotherapy: Yes; Ongoing? Yes; Most recent administration: 06/18/2021 Immunotherapy?  Yes; Type: Keytruda; Ongoing? Yes Radiation therapy?  Yes; Date Range: 04/20/2020; Target: Brain. EXAM: CT  CHEST, ABDOMEN, AND PELVIS WITH CONTRAST TECHNIQUE: Multidetector CT imaging of the chest, abdomen and pelvis was performed following the standard protocol during bolus administration of intravenous contrast. RADIATION DOSE REDUCTION: This exam was performed according to the departmental dose-optimization program which includes automated exposure control, adjustment of the mA and/or kV according to patient size and/or use of iterative reconstruction technique. CONTRAST:  145mL OMNIPAQUE IOHEXOL 300 MG/ML  SOLN COMPARISON:  Most recent CT chest, abdomen and pelvis 05/06/2021. 04/02/2020 PET-CT. FINDINGS: CT CHEST FINDINGS Cardiovascular: Port in the anterior chest wall with tip in distal SVC. No significant vascular findings. Normal heart size. No pericardial effusion. Mediastinum/Nodes: No axillary or supraclavicular adenopathy. No mediastinal or hilar adenopathy. No pericardial fluid. Esophagus normal. Lungs/Pleura: Linear thickening in the RIGHT upper lobe lung cancer treatment site measures 4.5 x 1.1 cm compared to 3.7 x 1.1 cm. Visually lesion looks unchanged. Nodule in the RIGHT lung apex  measuring 12 mm compared to 13 mm (image 38/4). Sub solid nodule in the RIGHT lower lobe measures 23 mm compared to 25 mm (image 89/4) mix cystic and solid nodule in the LEFT lower lobe measures 2.8 cm compared to 3.2 cm. Nodular component measures 15 mm (image 117/4 compared to 17 mm. No new nodularity. Musculoskeletal: Sclerotic lesion in the lower thoracic spine measuring 10 mm unchanged from prior (image 136/4). CT ABDOMEN AND PELVIS FINDINGS Hepatobiliary: No focal hepatic lesion. No biliary ductal dilatation. Gallbladder is normal. Common bile duct is normal. Pancreas: Pancreas is normal. No ductal dilatation. No pancreatic inflammation. Spleen: Normal spleen Adrenals/urinary tract: Adrenal glands and kidneys are normal. The ureters and bladder normal. Stomach/Bowel: Stomach duodenum normal. Proximal small bowel is normal. There is a loop of small bowel in the upper pelvis which demonstrates submucosal edema/bowel wall thickening (image 105/2. There is luminal narrowing associated with submucosal edema. The more distal small bowel leading up to the terminal ileum appears normal. Appendix is normal caliber however there is small amount of haziness surrounding the appendix within the fat (image 95/2. Mild thickening along the pericolic gutter on the RIGHT (image 92/2. This is favored related to more generalized increase in intraperitoneal free fluid. Free fluid noted along the RIGHT hepatic margin, LEFT pericolic gutter, beneath the LEFT hemidiaphragm and in the pelvis. The ascending, transverse and descending colon normal. Rectosigmoid colon normal. Vascular/Lymphatic: Abdominal aorta is normal caliber with atherosclerotic calcification. There is no retroperitoneal or periportal lymphadenopathy. No pelvic lymphadenopathy. Reproductive: Prostate unremarkable Other: Free fluid as described in GI section Musculoskeletal: No aggressive osseous lesion. IMPRESSION: Chest Impression: 1. Stable treatment site in the  RIGHT upper lobe. 2. Bilateral solid and subsolid pulmonary nodules not changed significantly in size comparison exam. Nodule remain concerning for multifocal adenocarcinoma. 3. No mediastinal lymphadenopathy. Abdomen / Pelvis Impression: 1. Long segment small bowel inflammation/submucosal edema within the upper pelvis. There is new intraperitoneal free fluid beneath the hemidiaphragms, along the pericolic gutters, and within the pelvis. Findings concerning for enteritis with associated free fluid. Differential enteritis would include including chemo toxicity or immuno toxicity, infectious enteritis, or inflammatory disease. Ischemia not favored. 2. There is haziness surrounding the appendix; however, this is favored related to the generalized increase in intraperitoneal free fluid rather than acute appendicitis. Recommend clinical correlation for appendicitis. These results will be called to the ordering clinician or representative by the Radiologist Assistant, and communication documented in the PACS or [employer]. Electronically Signed   By: Suzy Bouchard M.D.   On: 07/05/2021 11:18  Procedures Procedures    Medications Ordered in ED Medications - No data to display  ED Course/ Medical Decision Making/ A&P                           Medical Decision Making Amount and/or Complexity of Data Reviewed Labs: ordered. ECG/medicine tests: ordered.   Patient seen by general surgery and agree that this is likely not appendicitis.  Patient's abdominal exam is mostly benign.  Patient feels at his baseline at this time.  Labs are reassuring.  Will discharge home        Final Clinical Impression(s) / ED Diagnoses Final diagnoses:  None    Rx / DC Orders ED Discharge Orders     None         Lacretia Leigh, MD 07/05/21 1615

## 2021-07-09 ENCOUNTER — Encounter: Payer: Self-pay | Admitting: Internal Medicine

## 2021-07-09 ENCOUNTER — Other Ambulatory Visit: Payer: Self-pay

## 2021-07-09 ENCOUNTER — Inpatient Hospital Stay (HOSPITAL_BASED_OUTPATIENT_CLINIC_OR_DEPARTMENT_OTHER): Payer: 59 | Admitting: Internal Medicine

## 2021-07-09 ENCOUNTER — Inpatient Hospital Stay: Payer: 59

## 2021-07-09 VITALS — BP 110/68 | HR 68 | Temp 96.7°F | Resp 18 | Ht 72.0 in | Wt 166.3 lb

## 2021-07-09 DIAGNOSIS — Z5111 Encounter for antineoplastic chemotherapy: Secondary | ICD-10-CM

## 2021-07-09 DIAGNOSIS — C3491 Malignant neoplasm of unspecified part of right bronchus or lung: Secondary | ICD-10-CM

## 2021-07-09 DIAGNOSIS — Z5112 Encounter for antineoplastic immunotherapy: Secondary | ICD-10-CM | POA: Diagnosis not present

## 2021-07-09 DIAGNOSIS — C7931 Secondary malignant neoplasm of brain: Secondary | ICD-10-CM | POA: Diagnosis not present

## 2021-07-09 DIAGNOSIS — Z95828 Presence of other vascular implants and grafts: Secondary | ICD-10-CM

## 2021-07-09 LAB — CMP (CANCER CENTER ONLY)
ALT: 25 U/L (ref 0–44)
AST: 30 U/L (ref 15–41)
Albumin: 3.7 g/dL (ref 3.5–5.0)
Alkaline Phosphatase: 52 U/L (ref 38–126)
Anion gap: 6 (ref 5–15)
BUN: 13 mg/dL (ref 8–23)
CO2: 27 mmol/L (ref 22–32)
Calcium: 9.2 mg/dL (ref 8.9–10.3)
Chloride: 107 mmol/L (ref 98–111)
Creatinine: 1.05 mg/dL (ref 0.61–1.24)
GFR, Estimated: >60 mL/min (ref >60)
Glucose, Bld: 88 mg/dL (ref 70–99)
Potassium: 3.9 mmol/L (ref 3.5–5.1)
Sodium: 140 mmol/L (ref 135–145)
Total Bilirubin: 0.3 mg/dL (ref 0.3–1.2)
Total Protein: 7.1 g/dL (ref 6.5–8.1)

## 2021-07-09 LAB — CBC WITH DIFFERENTIAL (CANCER CENTER ONLY)
Abs Immature Granulocytes: 0.01 10*3/uL (ref 0.00–0.07)
Basophils Absolute: 0 10*3/uL (ref 0.0–0.1)
Basophils Relative: 0 %
Eosinophils Absolute: 0.2 10*3/uL (ref 0.0–0.5)
Eosinophils Relative: 3 %
HCT: 30.7 % — ABNORMAL LOW (ref 39.0–52.0)
Hemoglobin: 10.5 g/dL — ABNORMAL LOW (ref 13.0–17.0)
Immature Granulocytes: 0 %
Lymphocytes Relative: 44 %
Lymphs Abs: 2 10*3/uL (ref 0.7–4.0)
MCH: 28.5 pg (ref 26.0–34.0)
MCHC: 34.2 g/dL (ref 30.0–36.0)
MCV: 83.2 fL (ref 80.0–100.0)
Monocytes Absolute: 0.6 10*3/uL (ref 0.1–1.0)
Monocytes Relative: 13 %
Neutro Abs: 1.9 10*3/uL (ref 1.7–7.7)
Neutrophils Relative %: 40 %
Platelet Count: 158 10*3/uL (ref 150–400)
RBC: 3.69 MIL/uL — ABNORMAL LOW (ref 4.22–5.81)
RDW: 16.3 % — ABNORMAL HIGH (ref 11.5–15.5)
WBC Count: 4.7 10*3/uL (ref 4.0–10.5)
nRBC: 0 % (ref 0.0–0.2)

## 2021-07-09 LAB — TSH: TSH: 2.135 u[IU]/mL (ref 0.320–4.118)

## 2021-07-09 MED ORDER — SODIUM CHLORIDE 0.9 % IV SOLN
500.0000 mg/m2 | Freq: Once | INTRAVENOUS | Status: AC
  Administered 2021-07-09: 11:00:00 900 mg via INTRAVENOUS
  Filled 2021-07-09: qty 20

## 2021-07-09 MED ORDER — HEPARIN SOD (PORK) LOCK FLUSH 100 UNIT/ML IV SOLN
500.0000 [IU] | Freq: Once | INTRAVENOUS | Status: AC | PRN
  Administered 2021-07-09: 11:00:00 500 [IU]

## 2021-07-09 MED ORDER — PREDNISONE 20 MG PO TABS: ORAL_TABLET | ORAL | 0 refills | Status: DC

## 2021-07-09 MED ORDER — SODIUM CHLORIDE 0.9 % IV SOLN: Freq: Once | INTRAVENOUS | Status: AC

## 2021-07-09 MED ORDER — SODIUM CHLORIDE 0.9% FLUSH
10.0000 mL | INTRAVENOUS | Status: DC | PRN
  Administered 2021-07-09: 11:00:00 10 mL

## 2021-07-09 MED ORDER — SODIUM CHLORIDE 0.9% FLUSH
10.0000 mL | Freq: Once | INTRAVENOUS | Status: AC
  Administered 2021-07-09: 08:00:00 10 mL

## 2021-07-09 MED ORDER — PROCHLORPERAZINE MALEATE 10 MG PO TABS
10.0000 mg | ORAL_TABLET | Freq: Once | ORAL | Status: AC
  Administered 2021-07-09: 10:00:00 10 mg via ORAL
  Filled 2021-07-09: qty 1

## 2021-07-09 NOTE — Progress Notes (Signed)
[facility] [phone]   [phone]  OFFICE PROGRESS NOTE  [name], [place] [id number]  DIAGNOSIS: Stage IV (T3, N2, M1c) non-small cell lung cancer favoring adenocarcinoma presented with right upper lobe lung mass in addition to right lower lobe, left lower lobe in addition to right hilar and mediastinal lymphadenopathy in addition to metastatic brain lesions diagnosed in October 2021.  Molecular studies by [employer]:  [id number], 9.5%,   PRIOR THERAPY: SRS to 3 brain lesion under the care of Dr. Lisbeth Renshaw.  Scheduled for April 20, 2020  CURRENT THERAPY: Systemic chemotherapy with carboplatin for AUC of 5, Alimta 500 mg/M2 and Keytruda 200 mg IV every 3 weeks.  First dose April 24, 2020.  Status post 21 cycles.  Starting from cycle #5 the patient will be on maintenance treatment with Alimta and Keytruda every 3 weeks.  INTERVAL HISTORY: Nicolas Allen 66 y.o. male returns to the clinic today for follow-up visit accompanied by his wife.  The patient is feeling fine today with no concerning complaints.  He was seen recently at the emergency department complaining of abdominal pain that was crampy without fever, nausea vomiting or diarrhea.  He had few episodes of diarrhea after the oral contrast for the abdominal CT.  The patient is feeling much better now.  He denied having any current chest pain, shortness of breath, cough or hemoptysis.  He denied having any fever or chills.  He has no nausea, vomiting, diarrhea or constipation.  He has no headache or visual changes.  He has no significant weight loss or night sweats.  He had repeat CT scan of the chest, abdomen and pelvis performed recently and he is here today for evaluation and discussion of his scan results.  MEDICAL HISTORY: Past Medical History:  Diagnosis Date   Anemia 06/24/2012   ANXIETY 02/03/2007   Cervical disc disease 02/12/2012   S/p surgury jan  2013   Erectile dysfunction 02/11/2011   GERD (gastroesophageal reflux disease)    GLUCOSE INTOLERANCE 01/04/2008   HYPERLIPIDEMIA 02/03/2007   HYPERTENSION 02/03/2007   IBS (irritable bowel syndrome)    Impaired glucose tolerance 02/11/2011   nscl ca 03/30/2020   Smoker 08/22/2014   Substance abuse ([place]) 2001   sober for 16 yrs    ALLERGIES:  has No Known Allergies.  MEDICATIONS:  Current Outpatient Medications  Medication Sig Dispense Refill   amLODipine-benazepril (LOTREL) 10-20 MG capsule Take 1 capsule by mouth once daily 90 capsule 1   aspirin 325 MG EC tablet Take 1 tablet (325 mg total) by mouth daily. 90 tablet 99   b complex vitamins tablet Take 1 tablet by mouth daily.     cholecalciferol (VITAMIN D3) 25 MCG (1000 UNIT) tablet Take 1,000 Units by mouth daily.     folic acid (FOLVITE) 1 MG tablet Take 1 tablet (1 mg total) by mouth daily. 90 tablet 3   Garlic 2683 MG CAPS Take 1,000 mg by mouth daily.      lidocaine-prilocaine (EMLA) cream Apply to the Port-A-Cath site 30-60 minutes before chemotherapy. 30 g 0   Omega-3 Fatty Acids (FISH OIL PO) Take 1 capsule by mouth daily.      polyethylene glycol (MIRALAX / GLYCOLAX) 17 g packet Take 17 g by mouth daily as needed.     prochlorperazine (COMPAZINE) 10 MG tablet Take 1 tablet (10 mg total) by mouth every 6 (six) hours as needed  for nausea or vomiting. 30 tablet 0   rosuvastatin (CRESTOR) 20 MG tablet Take 1 tablet (20 mg total) by mouth daily. 90 tablet 3   vardenafil (LEVITRA) 20 MG tablet TAKE 1 TABLET BY MOUTH AS NEEDED FOR  ERECTILE  DYSFUNCTION 10 tablet 5   No current facility-administered medications for this visit.    SURGICAL HISTORY:  Past Surgical History:  Procedure Laterality Date   BRONCHIAL BIOPSY  03/30/2020   Procedure: BRONCHIAL BIOPSIES;  Surgeon: Garner Nash, DO;  Location: [facility];  Service: Pulmonary;;   BRONCHIAL BRUSHINGS  03/30/2020   Procedure: BRONCHIAL BRUSHINGS;  Surgeon:  Garner Nash, DO;  Location: [facility];  Service: Pulmonary;;   BRONCHIAL NEEDLE ASPIRATION BIOPSY  03/30/2020   Procedure: BRONCHIAL NEEDLE ASPIRATION BIOPSIES;  Surgeon: Garner Nash, DO;  Location: [facility];  Service: Pulmonary;;   BRONCHIAL WASHINGS  03/30/2020   Procedure: BRONCHIAL WASHINGS;  Surgeon: Garner Nash, DO;  Location: [facility];  Service: Pulmonary;;   COLONOSCOPY  2007   IR IMAGING GUIDED PORT INSERTION  04/23/2020   neck fusion  2012   C4   NO PAST SURGERIES     VIDEO BRONCHOSCOPY WITH ENDOBRONCHIAL NAVIGATION N/A 03/30/2020   Procedure: VIDEO BRONCHOSCOPY WITH ENDOBRONCHIAL NAVIGATION;  Surgeon: Garner Nash, DO;  Location: [facility];  Service: Pulmonary;  Laterality: N/A;   VIDEO BRONCHOSCOPY WITH ENDOBRONCHIAL ULTRASOUND N/A 03/30/2020   Procedure: VIDEO BRONCHOSCOPY WITH ENDOBRONCHIAL ULTRASOUND;  Surgeon: Garner Nash, DO;  Location: [facility];  Service: Pulmonary;  Laterality: N/A;    REVIEW OF SYSTEMS:  Constitutional: positive for fatigue Eyes: negative Ears, nose, mouth, throat, and face: negative Respiratory: negative Cardiovascular: negative Gastrointestinal: negative Genitourinary:negative Integument/breast: negative Hematologic/lymphatic: negative Musculoskeletal:negative Neurological: negative Behavioral/Psych: negative Endocrine: negative Allergic/Immunologic: negative   PHYSICAL EXAMINATION: General appearance: alert, cooperative, and no distress Head: Normocephalic, without obvious abnormality, atraumatic Neck: no adenopathy, no JVD, supple, symmetrical, trachea midline, and thyroid not enlarged, symmetric, no tenderness/mass/nodules Lymph nodes: Cervical, supraclavicular, and axillary nodes normal. Resp: clear to auscultation bilaterally Back: symmetric, no curvature. ROM normal. No CVA tenderness. Cardio: regular rate and rhythm, S1, S2 normal, no murmur, click, rub or gallop GI: soft, non-tender; bowel  sounds normal; no masses,  no organomegaly Extremities: extremities normal, atraumatic, no cyanosis or edema Neurologic: Alert and oriented X 3, normal strength and tone. Normal symmetric reflexes. Normal coordination and gait  ECOG PERFORMANCE STATUS: 0 - Asymptomatic  Blood pressure 110/68, pulse 68, temperature (!) 96.7 F (35.9 C), temperature source Tympanic, resp. rate 18, height 6' (1.829 m), weight 166 lb 4.8 oz (75.4 kg), SpO2 100 %.  LABORATORY DATA: Lab Results  Component Value Date   WBC 4.7 07/09/2021   HGB 10.5 (L) 07/09/2021   HCT 30.7 (L) 07/09/2021   MCV 83.2 07/09/2021   PLT 158 07/09/2021      Chemistry      Component Value Date/Time   NA 134 (L) 07/05/2021 1229   K 4.0 07/05/2021 1229   CL 102 07/05/2021 1229   CO2 25 07/05/2021 1229   BUN 16 07/05/2021 1229   CREATININE 1.13 07/05/2021 1229   CREATININE 1.08 06/18/2021 1052   CREATININE 0.88 02/21/2020 1115      Component Value Date/Time   CALCIUM 8.6 (L) 07/05/2021 1229   ALKPHOS 47 07/05/2021 1229   AST 33 07/05/2021 1229   AST 30 06/18/2021 1052   ALT 31 07/05/2021 1229   ALT 26 06/18/2021 1052   BILITOT 0.4  07/05/2021 1229   BILITOT 0.4 06/18/2021 1052       RADIOGRAPHIC STUDIES: CT Chest W Contrast  Result Date: 07/05/2021 CLINICAL DATA:  Primary Cancer Type: Lung Imaging Indication: Assess response to therapy Interval therapy since last imaging? Yes Initial Cancer Diagnosis Date: 03/30/2020; Established by: Biopsy-proven Detailed Pathology: Stage IV non-small cell lung cancer favoring adenocarcinoma. Primary Tumor location:  Right upper lobe. Brain metastases. Surgeries: No. Chemotherapy: Yes; Ongoing? Yes; Most recent administration: 06/18/2021 Immunotherapy?  Yes; Type: Keytruda; Ongoing? Yes Radiation therapy?  Yes; Date Range: 04/20/2020; Target: Brain. EXAM: CT CHEST, ABDOMEN, AND PELVIS WITH CONTRAST TECHNIQUE: Multidetector CT imaging of the chest, abdomen and pelvis was performed  following the standard protocol during bolus administration of intravenous contrast. RADIATION DOSE REDUCTION: This exam was performed according to the departmental dose-optimization program which includes automated exposure control, adjustment of the mA and/or kV according to patient size and/or use of iterative reconstruction technique. CONTRAST:  176mL OMNIPAQUE IOHEXOL 300 MG/ML  SOLN COMPARISON:  Most recent CT chest, abdomen and pelvis 05/06/2021. 04/02/2020 PET-CT. FINDINGS: CT CHEST FINDINGS Cardiovascular: Port in the anterior chest wall with tip in distal SVC. No significant vascular findings. Normal heart size. No pericardial effusion. Mediastinum/Nodes: No axillary or supraclavicular adenopathy. No mediastinal or hilar adenopathy. No pericardial fluid. Esophagus normal. Lungs/Pleura: Linear thickening in the RIGHT upper lobe lung cancer treatment site measures 4.5 x 1.1 cm compared to 3.7 x 1.1 cm. Visually lesion looks unchanged. Nodule in the RIGHT lung apex measuring 12 mm compared to 13 mm (image 38/4). Sub solid nodule in the RIGHT lower lobe measures 23 mm compared to 25 mm (image 89/4) mix cystic and solid nodule in the LEFT lower lobe measures 2.8 cm compared to 3.2 cm. Nodular component measures 15 mm (image 117/4 compared to 17 mm. No new nodularity. Musculoskeletal: Sclerotic lesion in the lower thoracic spine measuring 10 mm unchanged from prior (image 136/4). CT ABDOMEN AND PELVIS FINDINGS Hepatobiliary: No focal hepatic lesion. No biliary ductal dilatation. Gallbladder is normal. Common bile duct is normal. Pancreas: Pancreas is normal. No ductal dilatation. No pancreatic inflammation. Spleen: Normal spleen Adrenals/urinary tract: Adrenal glands and kidneys are normal. The ureters and bladder normal. Stomach/Bowel: Stomach duodenum normal. Proximal small bowel is normal. There is a loop of small bowel in the upper pelvis which demonstrates submucosal edema/bowel wall thickening (image  105/2. There is luminal narrowing associated with submucosal edema. The more distal small bowel leading up to the terminal ileum appears normal. Appendix is normal caliber however there is small amount of haziness surrounding the appendix within the fat (image 95/2. Mild thickening along the pericolic gutter on the RIGHT (image 92/2. This is favored related to more generalized increase in intraperitoneal free fluid. Free fluid noted along the RIGHT hepatic margin, LEFT pericolic gutter, beneath the LEFT hemidiaphragm and in the pelvis. The ascending, transverse and descending colon normal. Rectosigmoid colon normal. Vascular/Lymphatic: Abdominal aorta is normal caliber with atherosclerotic calcification. There is no retroperitoneal or periportal lymphadenopathy. No pelvic lymphadenopathy. Reproductive: Prostate unremarkable Other: Free fluid as described in GI section Musculoskeletal: No aggressive osseous lesion. IMPRESSION: Chest Impression: 1. Stable treatment site in the RIGHT upper lobe. 2. Bilateral solid and subsolid pulmonary nodules not changed significantly in size comparison exam. Nodule remain concerning for multifocal adenocarcinoma. 3. No mediastinal lymphadenopathy. Abdomen / Pelvis Impression: 1. Long segment small bowel inflammation/submucosal edema within the upper pelvis. There is new intraperitoneal free fluid beneath the hemidiaphragms, along the pericolic gutters, and within  the pelvis. Findings concerning for enteritis with associated free fluid. Differential enteritis would include including chemo toxicity or immuno toxicity, infectious enteritis, or inflammatory disease. Ischemia not favored. 2. There is haziness surrounding the appendix; however, this is favored related to the generalized increase in intraperitoneal free fluid rather than acute appendicitis. Recommend clinical correlation for appendicitis. These results will be called to the ordering clinician or representative by the  Radiologist Assistant, and communication documented in the PACS or [employer]. Electronically Signed   By: Suzy Bouchard M.D.   On: 07/05/2021 11:18   CT Abdomen Pelvis W Contrast  Result Date: 07/05/2021 CLINICAL DATA:  Primary Cancer Type: Lung Imaging Indication: Assess response to therapy Interval therapy since last imaging? Yes Initial Cancer Diagnosis Date: 03/30/2020; Established by: Biopsy-proven Detailed Pathology: Stage IV non-small cell lung cancer favoring adenocarcinoma. Primary Tumor location:  Right upper lobe. Brain metastases. Surgeries: No. Chemotherapy: Yes; Ongoing? Yes; Most recent administration: 06/18/2021 Immunotherapy?  Yes; Type: Keytruda; Ongoing? Yes Radiation therapy?  Yes; Date Range: 04/20/2020; Target: Brain. EXAM: CT CHEST, ABDOMEN, AND PELVIS WITH CONTRAST TECHNIQUE: Multidetector CT imaging of the chest, abdomen and pelvis was performed following the standard protocol during bolus administration of intravenous contrast. RADIATION DOSE REDUCTION: This exam was performed according to the departmental dose-optimization program which includes automated exposure control, adjustment of the mA and/or kV according to patient size and/or use of iterative reconstruction technique. CONTRAST:  159mL OMNIPAQUE IOHEXOL 300 MG/ML  SOLN COMPARISON:  Most recent CT chest, abdomen and pelvis 05/06/2021. 04/02/2020 PET-CT. FINDINGS: CT CHEST FINDINGS Cardiovascular: Port in the anterior chest wall with tip in distal SVC. No significant vascular findings. Normal heart size. No pericardial effusion. Mediastinum/Nodes: No axillary or supraclavicular adenopathy. No mediastinal or hilar adenopathy. No pericardial fluid. Esophagus normal. Lungs/Pleura: Linear thickening in the RIGHT upper lobe lung cancer treatment site measures 4.5 x 1.1 cm compared to 3.7 x 1.1 cm. Visually lesion looks unchanged. Nodule in the RIGHT lung apex measuring 12 mm compared to 13 mm (image 38/4). Sub solid nodule  in the RIGHT lower lobe measures 23 mm compared to 25 mm (image 89/4) mix cystic and solid nodule in the LEFT lower lobe measures 2.8 cm compared to 3.2 cm. Nodular component measures 15 mm (image 117/4 compared to 17 mm. No new nodularity. Musculoskeletal: Sclerotic lesion in the lower thoracic spine measuring 10 mm unchanged from prior (image 136/4). CT ABDOMEN AND PELVIS FINDINGS Hepatobiliary: No focal hepatic lesion. No biliary ductal dilatation. Gallbladder is normal. Common bile duct is normal. Pancreas: Pancreas is normal. No ductal dilatation. No pancreatic inflammation. Spleen: Normal spleen Adrenals/urinary tract: Adrenal glands and kidneys are normal. The ureters and bladder normal. Stomach/Bowel: Stomach duodenum normal. Proximal small bowel is normal. There is a loop of small bowel in the upper pelvis which demonstrates submucosal edema/bowel wall thickening (image 105/2. There is luminal narrowing associated with submucosal edema. The more distal small bowel leading up to the terminal ileum appears normal. Appendix is normal caliber however there is small amount of haziness surrounding the appendix within the fat (image 95/2. Mild thickening along the pericolic gutter on the RIGHT (image 92/2. This is favored related to more generalized increase in intraperitoneal free fluid. Free fluid noted along the RIGHT hepatic margin, LEFT pericolic gutter, beneath the LEFT hemidiaphragm and in the pelvis. The ascending, transverse and descending colon normal. Rectosigmoid colon normal. Vascular/Lymphatic: Abdominal aorta is normal caliber with atherosclerotic calcification. There is no retroperitoneal or periportal lymphadenopathy. No pelvic lymphadenopathy.  Reproductive: Prostate unremarkable Other: Free fluid as described in GI section Musculoskeletal: No aggressive osseous lesion. IMPRESSION: Chest Impression: 1. Stable treatment site in the RIGHT upper lobe. 2. Bilateral solid and subsolid pulmonary nodules  not changed significantly in size comparison exam. Nodule remain concerning for multifocal adenocarcinoma. 3. No mediastinal lymphadenopathy. Abdomen / Pelvis Impression: 1. Long segment small bowel inflammation/submucosal edema within the upper pelvis. There is new intraperitoneal free fluid beneath the hemidiaphragms, along the pericolic gutters, and within the pelvis. Findings concerning for enteritis with associated free fluid. Differential enteritis would include including chemo toxicity or immuno toxicity, infectious enteritis, or inflammatory disease. Ischemia not favored. 2. There is haziness surrounding the appendix; however, this is favored related to the generalized increase in intraperitoneal free fluid rather than acute appendicitis. Recommend clinical correlation for appendicitis. These results will be called to the ordering clinician or representative by the Radiologist Assistant, and communication documented in the PACS or [employer]. Electronically Signed   By: Suzy Bouchard M.D.   On: 07/05/2021 11:18     ASSESSMENT AND PLAN: This is a very pleasant 66 years old African-American male recently diagnosed with a stage IV (T3, N2, M1c)  non-small cell lung cancer, adenocarcinoma presented with right upper lobe lung mass in addition to right lower lobe, left lower lobe in addition to right hilar and mediastinal lymphadenopathy in addition to metastatic brain lesions diagnosed in October 2021. The patient has molecular studies by [employer] that showed no actionable mutations. He underwent SRS treatment to his brain lesion. The patient is currently undergoing systemic chemotherapy with carboplatin for AUC of 5, Alimta 500 mg/M2 and Keytruda 200 mg IV every 3 weeks status post 21 cycles.  Starting from cycle #5 the patient is on maintenance treatment with Alimta and Keytruda every 3 weeks. The patient has been tolerating his treatment well except for recent abdominal pain and he was seen  at the emergency department for evaluation. Repeat CT scan of the chest, abdomen pelvis performed recently showed no concerning findings for disease progression but the scan of the abdomen showed findings concerning for enteritis associated with free fluid concerning for immunotherapy mediated enteritis. I discussed the scan results with the patient and his wife. I recommended for him to proceed with his systemic treatment but only with single agent Alimta.  I will hold his Keytruda for now because of concern about the immunotherapy induced enteritis. I will start the patient on a tapered dose of prednisone for the next few weeks. I may consider resuming his Keytruda starting from the next cycle if he has no concerning symptoms. The patient was advised to call immediately if he has any other concerning symptoms in the interval. The patient voices understanding of current disease status and treatment options and is in agreement with the current care plan.  All questions were answered. The patient knows to call the clinic with any problems, questions or concerns. We can certainly see the patient much sooner if necessary.  Disclaimer: This note was dictated with voice recognition software. Similar sounding words can inadvertently be transcribed and may not be corrected upon review.

## 2021-07-09 NOTE — Patient Instructions (Signed)
[facility]  Discharge Instructions: Thank you for choosing [facility] to provide your oncology and hematology care.   If you have a lab appointment with the [facility], please go directly to the [facility] and check in at the registration area.   Wear comfortable clothing and clothing appropriate for easy access to any Portacath or PICC line.   We strive to give you quality time with your provider. You may need to reschedule your appointment if you arrive late (15 or more minutes).  Arriving late affects you and other patients whose appointments are after yours.  Also, if you miss three or more appointments without notifying the office, you may be dismissed from the clinic at the providers discretion.      For prescription refill requests, have your pharmacy contact our office and allow 72 hours for refills to be completed.    Today you received the following chemotherapy and/or immunotherapy agents: Pemetrexed   To help prevent nausea and vomiting after your treatment, we encourage you to take your nausea medication as directed.  BELOW ARE SYMPTOMS THAT SHOULD BE REPORTED IMMEDIATELY: *FEVER GREATER THAN 100.4 F (38 C) OR HIGHER *CHILLS OR SWEATING *NAUSEA AND VOMITING THAT IS NOT CONTROLLED WITH YOUR NAUSEA MEDICATION *UNUSUAL SHORTNESS OF BREATH *UNUSUAL BRUISING OR BLEEDING *URINARY PROBLEMS (pain or burning when urinating, or frequent urination) *BOWEL PROBLEMS (unusual diarrhea, constipation, pain near the anus) TENDERNESS IN MOUTH AND THROAT WITH OR WITHOUT PRESENCE OF ULCERS (sore throat, sores in mouth, or a toothache) UNUSUAL RASH, SWELLING OR PAIN  UNUSUAL VAGINAL DISCHARGE OR ITCHING   Items with * indicate a potential emergency and should be followed up as soon as possible or go to the Emergency Department if any problems should occur.  Please show the CHEMOTHERAPY ALERT CARD or IMMUNOTHERAPY ALERT CARD at check-in to  the Emergency Department and triage nurse.  Should you have questions after your visit or need to cancel or reschedule your appointment, please contact [facility]  Dept: [phone]  and follow the prompts.  Office hours are 8:00 a.m. to 4:30 p.m. Monday - Friday. Please note that voicemails left after 4:00 p.m. may not be returned until the following business day.  We are closed weekends and major holidays. You have access to a nurse at all times for urgent questions. Please call the main number to the clinic Dept: [phone] and follow the prompts.   For any non-urgent questions, you may also contact your provider using MyChart. We now offer e-Visits for anyone 65 and older to request care online for non-urgent symptoms. For details visit [email or web address].   Also download the MyChart app! Go to the app store, search "MyChart", open the app, select [facility], and log in with your MyChart username and password.  Due to Covid, a mask is required upon entering the hospital/clinic. If you do not have a mask, one will be given to you upon arrival. For doctor visits, patients may have 1 support person aged 65 or older with them. For treatment visits, patients cannot have anyone with them due to current Covid guidelines and our immunocompromised population.

## 2021-07-10 ENCOUNTER — Encounter: Payer: Self-pay | Admitting: Internal Medicine

## 2021-07-10 NOTE — Progress Notes (Signed)
Pt returned my call and would like to re apply for copay assistance for 2023 so I completed the [employer] enrollment application for [employer] application for Alimta, got the pt's and Dr. Worthy Flank signature and faxed today for processing.  I will notify the pt of the outcome once received.

## 2021-07-12 ENCOUNTER — Encounter: Payer: Self-pay | Admitting: Internal Medicine

## 2021-07-12 NOTE — Progress Notes (Signed)
Pt was approved w/ the [employer] program for $25,000 for Alimta from 03/13/21 through 07/11/22.  Pt's copay will be $25 each infusion.

## 2021-07-25 ENCOUNTER — Other Ambulatory Visit: Payer: Self-pay | Admitting: Radiation Therapy

## 2021-07-25 DIAGNOSIS — C7931 Secondary malignant neoplasm of brain: Secondary | ICD-10-CM

## 2021-07-26 NOTE — Progress Notes (Signed)
Nicolas Allen OFFICE PROGRESS NOTE  [name], MD [place] [id number]  DIAGNOSIS:  Stage IV (T3, N2, M1c) non-small cell lung cancer favoring adenocarcinoma presented with right upper lobe lung mass in addition to right lower lobe, left lower lobe in addition to right hilar and mediastinal lymphadenopathy in addition to metastatic brain lesions diagnosed in October 2021.   Molecular studies by [employer]:   [id number], 9.5%,   PRIOR THERAPY:  SRS to 3 brain lesion under the care of Dr. Lisbeth Renshaw.  Completed on April 20, 2020  CURRENT THERAPY:  Systemic chemotherapy with carboplatin for AUC of 5, Alimta 500 mg/M2 and Keytruda 200 mg IV every 3 weeks.  First dose April 24, 2020.  Status post 22 cycles.  Starting from cycle #5 the patient will be on maintenance treatment with Alimta and Keytruda every 3 weeks. 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   INTERVAL HISTORY: Nicolas Allen 66 y.o. male returns to the clinic today for a follow-up visit.  The patient is feeling fairly well today without any concerning complaints.  At the patient's last appointment, his restaging CT scan showed possible enteritis which may be immunotherapy related.  Therefore, the patient has been started on a prednisone taper and he is presently taking 0.5 milligrams daily.  He is currently undergoing treatment with single agent Alimta.  He tolerated his last cycle well without any concerning adverse side effects.  He denies any fever, chills, night sweats, or unexplained weight loss.  He notes he has a good appetite since starting the steroids and gained 7 lbs. Denies any nausea or vomiting.  Denies any abnormal diarrhea or constipation. He reports his stools are "soft" but not loose.  Denies any chest pain, shortness of breath, or hemoptysis.  Denies any significant cough.  Denies any headache or visual changes.  The patient is here  today for evaluation and repeat blood work before starting cycle #23.      MEDICAL HISTORY: Past Medical History:  Diagnosis Date   Anemia 06/24/2012   ANXIETY 02/03/2007   Cervical disc disease 02/12/2012   S/p surgury jan 2013   Erectile dysfunction 02/11/2011   GERD (gastroesophageal reflux disease)    GLUCOSE INTOLERANCE 01/04/2008   HYPERLIPIDEMIA 02/03/2007   HYPERTENSION 02/03/2007   IBS (irritable bowel syndrome)    Impaired glucose tolerance 02/11/2011   nscl ca 03/30/2020   Smoker 08/22/2014   Substance abuse ([place]) 2001   sober for 16 yrs    ALLERGIES:  has No Known Allergies.  MEDICATIONS:  Current Outpatient Medications  Medication Sig Dispense Refill   amLODipine-benazepril (LOTREL) 10-20 MG capsule Take 1 capsule by mouth once daily 90 capsule 1   aspirin 325 MG EC tablet Take 1 tablet (325 mg total) by mouth daily. 90 tablet 99   b complex vitamins tablet Take 1 tablet by mouth daily.     cholecalciferol (VITAMIN D3) 25 MCG (1000 UNIT) tablet Take 1,000 Units by mouth daily.     folic acid (FOLVITE) 1 MG tablet Take 1 tablet (1 mg total) by mouth daily. 90 tablet 3   Garlic 6606 MG CAPS Take 1,000 mg by mouth daily.      lidocaine-prilocaine (EMLA) cream Apply to the Port-A-Cath site 30-60 minutes before chemotherapy. 30 g 0   Omega-3 Fatty Acids (FISH OIL PO) Take 1 capsule by mouth daily.      polyethylene glycol (  MIRALAX / GLYCOLAX) 17 g packet Take 17 g by mouth daily as needed.     predniSONE (DELTASONE) 20 MG tablet 4 tablets p.o. daily for 5 days followed by 3 tablet p.o. daily for 5 days followed by 3 tablets p.o. daily for 5 days followed by 2 tablet p.o. daily for 5 days followed by 1 tablet p.o. daily for 5 days, followed by 0.5 tablet p.o. daily for 5 days. 53 tablet 0   prochlorperazine (COMPAZINE) 10 MG tablet Take 1 tablet (10 mg total) by mouth every 6 (six) hours as needed for nausea or vomiting. 30 tablet 0   rosuvastatin (CRESTOR) 20 MG  tablet Take 1 tablet (20 mg total) by mouth daily. 90 tablet 3   vardenafil (LEVITRA) 20 MG tablet TAKE 1 TABLET BY MOUTH AS NEEDED FOR  ERECTILE  DYSFUNCTION 10 tablet 5   No current facility-administered medications for this visit.    SURGICAL HISTORY:  Past Surgical History:  Procedure Laterality Date   BRONCHIAL BIOPSY  03/30/2020   Procedure: BRONCHIAL BIOPSIES;  Surgeon: Garner Nash, DO;  Location: [facility];  Service: Pulmonary;;   BRONCHIAL BRUSHINGS  03/30/2020   Procedure: BRONCHIAL BRUSHINGS;  Surgeon: Garner Nash, DO;  Location: [facility];  Service: Pulmonary;;   BRONCHIAL NEEDLE ASPIRATION BIOPSY  03/30/2020   Procedure: BRONCHIAL NEEDLE ASPIRATION BIOPSIES;  Surgeon: Garner Nash, DO;  Location: [facility];  Service: Pulmonary;;   BRONCHIAL WASHINGS  03/30/2020   Procedure: BRONCHIAL WASHINGS;  Surgeon: Garner Nash, DO;  Location: [facility];  Service: Pulmonary;;   COLONOSCOPY  2007   IR IMAGING GUIDED PORT INSERTION  04/23/2020   neck fusion  2012   C4   NO PAST SURGERIES     VIDEO BRONCHOSCOPY WITH ENDOBRONCHIAL NAVIGATION N/A 03/30/2020   Procedure: VIDEO BRONCHOSCOPY WITH ENDOBRONCHIAL NAVIGATION;  Surgeon: Garner Nash, DO;  Location: [facility];  Service: Pulmonary;  Laterality: N/A;   VIDEO BRONCHOSCOPY WITH ENDOBRONCHIAL ULTRASOUND N/A 03/30/2020   Procedure: VIDEO BRONCHOSCOPY WITH ENDOBRONCHIAL ULTRASOUND;  Surgeon: Garner Nash, DO;  Location: [facility];  Service: Pulmonary;  Laterality: N/A;    REVIEW OF SYSTEMS:   Review of Systems  Constitutional: Negative for appetite change, chills, fatigue, fever and unexpected weight change.  HENT:   Negative for mouth sores, nosebleeds, sore throat and trouble swallowing.   Eyes: Negative for eye problems and icterus.  Respiratory: Negative for cough, hemoptysis, shortness of breath and wheezing.   Cardiovascular: Negative for chest pain and leg swelling.  Gastrointestinal:  Negative for abdominal pain, constipation, diarrhea, nausea and vomiting.  Genitourinary: Negative for bladder incontinence, difficulty urinating, dysuria, frequency and hematuria.   Musculoskeletal: Negative for back pain, gait problem, neck pain and neck stiffness.  Skin: Negative for itching and rash.  Neurological: Negative for dizziness, extremity weakness, gait problem, headaches, light-headedness and seizures.  Hematological: Negative for adenopathy. Does not bruise/bleed easily.  Psychiatric/Behavioral: Negative for confusion, depression and sleep disturbance. The patient is not nervous/anxious.     PHYSICAL EXAMINATION:  There were no vitals taken for this visit.  ECOG PERFORMANCE STATUS: 1  Physical Exam  Constitutional: Oriented to person, place, and time and well-developed, well-nourished, and in no distress. HENT:  Head: Normocephalic and atraumatic.  Mouth/Throat: Oropharynx is clear and moist. No oropharyngeal exudate.  Eyes: Conjunctivae are normal. Right eye exhibits no discharge. Left eye exhibits no discharge. No scleral icterus.  Neck: Normal range of motion. Neck supple.  Cardiovascular: Normal rate, regular  rhythm, normal heart sounds and intact distal pulses.   Pulmonary/Chest: Effort normal and breath sounds normal. No respiratory distress. No wheezes. No rales.  Abdominal: Soft. Bowel sounds are normal. Exhibits no distension and no mass. There is no tenderness.  Musculoskeletal: Normal range of motion. Exhibits no edema.  Lymphadenopathy:    No cervical adenopathy.  Neurological: Alert and oriented to person, place, and time. Exhibits normal muscle tone. Gait normal. Coordination normal.  Skin: Skin is warm and dry. No rash noted. Not diaphoretic. No erythema. No pallor.  Psychiatric: Mood, memory and judgment normal.  Vitals reviewed.  LABORATORY DATA: Lab Results  Component Value Date   WBC 4.7 07/09/2021   HGB 10.5 (L) 07/09/2021   HCT 30.7 (L)  07/09/2021   MCV 83.2 07/09/2021   PLT 158 07/09/2021      Chemistry      Component Value Date/Time   NA 140 07/09/2021 0828   K 3.9 07/09/2021 0828   CL 107 07/09/2021 0828   CO2 27 07/09/2021 0828   BUN 13 07/09/2021 0828   CREATININE 1.05 07/09/2021 0828   CREATININE 0.88 02/21/2020 1115      Component Value Date/Time   CALCIUM 9.2 07/09/2021 0828   ALKPHOS 52 07/09/2021 0828   AST 30 07/09/2021 0828   ALT 25 07/09/2021 0828   BILITOT 0.3 07/09/2021 0828       RADIOGRAPHIC STUDIES:  CT Chest W Contrast  Result Date: 07/05/2021 CLINICAL DATA:  Primary Cancer Type: Lung Imaging Indication: Assess response to therapy Interval therapy since last imaging? Yes Initial Cancer Diagnosis Date: 03/30/2020; Established by: Biopsy-proven Detailed Pathology: Stage IV non-small cell lung cancer favoring adenocarcinoma. Primary Tumor location:  Right upper lobe. Brain metastases. Surgeries: No. Chemotherapy: Yes; Ongoing? Yes; Most recent administration: 06/18/2021 Immunotherapy?  Yes; Type: Keytruda; Ongoing? Yes Radiation therapy?  Yes; Date Range: 04/20/2020; Target: Brain. EXAM: CT CHEST, ABDOMEN, AND PELVIS WITH CONTRAST TECHNIQUE: Multidetector CT imaging of the chest, abdomen and pelvis was performed following the standard protocol during bolus administration of intravenous contrast. RADIATION DOSE REDUCTION: This exam was performed according to the departmental dose-optimization program which includes automated exposure control, adjustment of the mA and/or kV according to patient size and/or use of iterative reconstruction technique. CONTRAST:  120mL OMNIPAQUE IOHEXOL 300 MG/ML  SOLN COMPARISON:  Most recent CT chest, abdomen and pelvis 05/06/2021. 04/02/2020 PET-CT. FINDINGS: CT CHEST FINDINGS Cardiovascular: Port in the anterior chest wall with tip in distal SVC. No significant vascular findings. Normal heart size. No pericardial effusion. Mediastinum/Nodes: No axillary or supraclavicular  adenopathy. No mediastinal or hilar adenopathy. No pericardial fluid. Esophagus normal. Lungs/Pleura: Linear thickening in the RIGHT upper lobe lung cancer treatment site measures 4.5 x 1.1 cm compared to 3.7 x 1.1 cm. Visually lesion looks unchanged. Nodule in the RIGHT lung apex measuring 12 mm compared to 13 mm (image 38/4). Sub solid nodule in the RIGHT lower lobe measures 23 mm compared to 25 mm (image 89/4) mix cystic and solid nodule in the LEFT lower lobe measures 2.8 cm compared to 3.2 cm. Nodular component measures 15 mm (image 117/4 compared to 17 mm. No new nodularity. Musculoskeletal: Sclerotic lesion in the lower thoracic spine measuring 10 mm unchanged from prior (image 136/4). CT ABDOMEN AND PELVIS FINDINGS Hepatobiliary: No focal hepatic lesion. No biliary ductal dilatation. Gallbladder is normal. Common bile duct is normal. Pancreas: Pancreas is normal. No ductal dilatation. No pancreatic inflammation. Spleen: Normal spleen Adrenals/urinary tract: Adrenal glands and kidneys are normal. The  ureters and bladder normal. Stomach/Bowel: Stomach duodenum normal. Proximal small bowel is normal. There is a loop of small bowel in the upper pelvis which demonstrates submucosal edema/bowel wall thickening (image 105/2. There is luminal narrowing associated with submucosal edema. The more distal small bowel leading up to the terminal ileum appears normal. Appendix is normal caliber however there is small amount of haziness surrounding the appendix within the fat (image 95/2. Mild thickening along the pericolic gutter on the RIGHT (image 92/2. This is favored related to more generalized increase in intraperitoneal free fluid. Free fluid noted along the RIGHT hepatic margin, LEFT pericolic gutter, beneath the LEFT hemidiaphragm and in the pelvis. The ascending, transverse and descending colon normal. Rectosigmoid colon normal. Vascular/Lymphatic: Abdominal aorta is normal caliber with atherosclerotic  calcification. There is no retroperitoneal or periportal lymphadenopathy. No pelvic lymphadenopathy. Reproductive: Prostate unremarkable Other: Free fluid as described in GI section Musculoskeletal: No aggressive osseous lesion. IMPRESSION: Chest Impression: 1. Stable treatment site in the RIGHT upper lobe. 2. Bilateral solid and subsolid pulmonary nodules not changed significantly in size comparison exam. Nodule remain concerning for multifocal adenocarcinoma. 3. No mediastinal lymphadenopathy. Abdomen / Pelvis Impression: 1. Long segment small bowel inflammation/submucosal edema within the upper pelvis. There is new intraperitoneal free fluid beneath the hemidiaphragms, along the pericolic gutters, and within the pelvis. Findings concerning for enteritis with associated free fluid. Differential enteritis would include including chemo toxicity or immuno toxicity, infectious enteritis, or inflammatory disease. Ischemia not favored. 2. There is haziness surrounding the appendix; however, this is favored related to the generalized increase in intraperitoneal free fluid rather than acute appendicitis. Recommend clinical correlation for appendicitis. These results will be called to the ordering clinician or representative by the Radiologist Assistant, and communication documented in the PACS or [employer]. Electronically Signed   By: Suzy Bouchard M.D.   On: 07/05/2021 11:18   CT Abdomen Pelvis W Contrast  Result Date: 07/05/2021 CLINICAL DATA:  Primary Cancer Type: Lung Imaging Indication: Assess response to therapy Interval therapy since last imaging? Yes Initial Cancer Diagnosis Date: 03/30/2020; Established by: Biopsy-proven Detailed Pathology: Stage IV non-small cell lung cancer favoring adenocarcinoma. Primary Tumor location:  Right upper lobe. Brain metastases. Surgeries: No. Chemotherapy: Yes; Ongoing? Yes; Most recent administration: 06/18/2021 Immunotherapy?  Yes; Type: Keytruda; Ongoing? Yes  Radiation therapy?  Yes; Date Range: 04/20/2020; Target: Brain. EXAM: CT CHEST, ABDOMEN, AND PELVIS WITH CONTRAST TECHNIQUE: Multidetector CT imaging of the chest, abdomen and pelvis was performed following the standard protocol during bolus administration of intravenous contrast. RADIATION DOSE REDUCTION: This exam was performed according to the departmental dose-optimization program which includes automated exposure control, adjustment of the mA and/or kV according to patient size and/or use of iterative reconstruction technique. CONTRAST:  162mL OMNIPAQUE IOHEXOL 300 MG/ML  SOLN COMPARISON:  Most recent CT chest, abdomen and pelvis 05/06/2021. 04/02/2020 PET-CT. FINDINGS: CT CHEST FINDINGS Cardiovascular: Port in the anterior chest wall with tip in distal SVC. No significant vascular findings. Normal heart size. No pericardial effusion. Mediastinum/Nodes: No axillary or supraclavicular adenopathy. No mediastinal or hilar adenopathy. No pericardial fluid. Esophagus normal. Lungs/Pleura: Linear thickening in the RIGHT upper lobe lung cancer treatment site measures 4.5 x 1.1 cm compared to 3.7 x 1.1 cm. Visually lesion looks unchanged. Nodule in the RIGHT lung apex measuring 12 mm compared to 13 mm (image 38/4). Sub solid nodule in the RIGHT lower lobe measures 23 mm compared to 25 mm (image 89/4) mix cystic and solid nodule in the LEFT lower  lobe measures 2.8 cm compared to 3.2 cm. Nodular component measures 15 mm (image 117/4 compared to 17 mm. No new nodularity. Musculoskeletal: Sclerotic lesion in the lower thoracic spine measuring 10 mm unchanged from prior (image 136/4). CT ABDOMEN AND PELVIS FINDINGS Hepatobiliary: No focal hepatic lesion. No biliary ductal dilatation. Gallbladder is normal. Common bile duct is normal. Pancreas: Pancreas is normal. No ductal dilatation. No pancreatic inflammation. Spleen: Normal spleen Adrenals/urinary tract: Adrenal glands and kidneys are normal. The ureters and bladder  normal. Stomach/Bowel: Stomach duodenum normal. Proximal small bowel is normal. There is a loop of small bowel in the upper pelvis which demonstrates submucosal edema/bowel wall thickening (image 105/2. There is luminal narrowing associated with submucosal edema. The more distal small bowel leading up to the terminal ileum appears normal. Appendix is normal caliber however there is small amount of haziness surrounding the appendix within the fat (image 95/2. Mild thickening along the pericolic gutter on the RIGHT (image 92/2. This is favored related to more generalized increase in intraperitoneal free fluid. Free fluid noted along the RIGHT hepatic margin, LEFT pericolic gutter, beneath the LEFT hemidiaphragm and in the pelvis. The ascending, transverse and descending colon normal. Rectosigmoid colon normal. Vascular/Lymphatic: Abdominal aorta is normal caliber with atherosclerotic calcification. There is no retroperitoneal or periportal lymphadenopathy. No pelvic lymphadenopathy. Reproductive: Prostate unremarkable Other: Free fluid as described in GI section Musculoskeletal: No aggressive osseous lesion. IMPRESSION: Chest Impression: 1. Stable treatment site in the RIGHT upper lobe. 2. Bilateral solid and subsolid pulmonary nodules not changed significantly in size comparison exam. Nodule remain concerning for multifocal adenocarcinoma. 3. No mediastinal lymphadenopathy. Abdomen / Pelvis Impression: 1. Long segment small bowel inflammation/submucosal edema within the upper pelvis. There is new intraperitoneal free fluid beneath the hemidiaphragms, along the pericolic gutters, and within the pelvis. Findings concerning for enteritis with associated free fluid. Differential enteritis would include including chemo toxicity or immuno toxicity, infectious enteritis, or inflammatory disease. Ischemia not favored. 2. There is haziness surrounding the appendix; however, this is favored related to the generalized increase  in intraperitoneal free fluid rather than acute appendicitis. Recommend clinical correlation for appendicitis. These results will be called to the ordering clinician or representative by the Radiologist Assistant, and communication documented in the PACS or [employer]. Electronically Signed   By: Suzy Bouchard M.D.   On: 07/05/2021 11:18     ASSESSMENT/PLAN:  This is a very pleasant 66 year old African-American male diagnosed with stage IV (T3, N2, M1C) non-small cell lung cancer, adenocarcinoma.  He presented with a right upper lobe lung mass in addition to a right lower lobe, left lower lobe lung lesions with right hilar and mediastinal lymphadenopathy.  He also had metastatic disease to the brain.  He was diagnosed in October 2022.  His molecular studies by [employer] did not show any actionable mutations.   The patient underwent SRS treatment to the metastatic brain lesions under the care of Dr. Lisbeth Renshaw.  This was completed on 04/20/2021.   The patient is currently undergoing systemic chemotherapy/immunotherapy.  He started with carboplatin for an AUC of 5, Alimta 500 mg per metered squared, Keytruda 200 mg IV every 3 weeks.  He is status post 22 cycles.  Starting from cycle #5, the patient has been on maintenance treatment with Alimta and Keytruda IV every 3 weeks. Starting from cycle #22, the patient has been on single agent alimta. [name] has been on hold due to CT findings of suspicious immunotherapy mediated enteritis.    Labs  are still pending. As long as his labs are within parameters, I recommend that he proceed with cycle #23 today as scheduled with single agent Alimta.    We will see him back for a follow up visit in 3 weeks for evaluation and to review his scan before starting cycle #24.   He will complete his prednisone taper in the interval until his next appointment. We will discuss with Dr. Julien Nordmann that day about possibly resuming keytruda vs continuing to hold. We will  also likely arrange for a restaging CT scan at that next appointment.    The patient was advised to call immediately if he has any concerning symptoms in the interval. The patient voices understanding of current disease status and treatment options and is in agreement with the current care plan. All questions were answered. The patient knows to call the clinic with any problems, questions or concerns. We can certainly see the patient much sooner if necessary    No orders of the defined types were placed in this encounter.     The total time spent in the appointment was 20-29 minutes.   Samyiah Halvorsen L Keelie Zemanek, PA-C 07/26/21

## 2021-07-29 ENCOUNTER — Other Ambulatory Visit: Payer: Self-pay

## 2021-07-29 DIAGNOSIS — C3491 Malignant neoplasm of unspecified part of right bronchus or lung: Secondary | ICD-10-CM

## 2021-07-30 ENCOUNTER — Other Ambulatory Visit: Payer: Self-pay

## 2021-07-30 ENCOUNTER — Encounter: Payer: Self-pay | Admitting: Physician Assistant

## 2021-07-30 ENCOUNTER — Inpatient Hospital Stay: Payer: 59 | Attending: Internal Medicine

## 2021-07-30 ENCOUNTER — Inpatient Hospital Stay: Payer: 59

## 2021-07-30 ENCOUNTER — Inpatient Hospital Stay (HOSPITAL_BASED_OUTPATIENT_CLINIC_OR_DEPARTMENT_OTHER): Payer: 59 | Admitting: Physician Assistant

## 2021-07-30 VITALS — BP 109/69 | HR 88 | Temp 98.2°F | Resp 18 | Ht 72.0 in | Wt 172.5 lb

## 2021-07-30 DIAGNOSIS — Z79899 Other long term (current) drug therapy: Secondary | ICD-10-CM | POA: Diagnosis not present

## 2021-07-30 DIAGNOSIS — Z5111 Encounter for antineoplastic chemotherapy: Secondary | ICD-10-CM | POA: Insufficient documentation

## 2021-07-30 DIAGNOSIS — Z95828 Presence of other vascular implants and grafts: Secondary | ICD-10-CM

## 2021-07-30 DIAGNOSIS — C3411 Malignant neoplasm of upper lobe, right bronchus or lung: Secondary | ICD-10-CM | POA: Diagnosis present

## 2021-07-30 DIAGNOSIS — Z87891 Personal history of nicotine dependence: Secondary | ICD-10-CM | POA: Insufficient documentation

## 2021-07-30 DIAGNOSIS — C3491 Malignant neoplasm of unspecified part of right bronchus or lung: Secondary | ICD-10-CM | POA: Diagnosis not present

## 2021-07-30 DIAGNOSIS — C7931 Secondary malignant neoplasm of brain: Secondary | ICD-10-CM | POA: Diagnosis present

## 2021-07-30 LAB — DIFFERENTIAL
Abs Immature Granulocytes: 0.11 10*3/uL — ABNORMAL HIGH (ref 0.00–0.07)
Basophils Absolute: 0 10*3/uL (ref 0.0–0.1)
Basophils Relative: 0 %
Eosinophils Absolute: 0 10*3/uL (ref 0.0–0.5)
Eosinophils Relative: 0 %
Immature Granulocytes: 1 %
Lymphocytes Relative: 29 %
Lymphs Abs: 2.7 10*3/uL (ref 0.7–4.0)
Monocytes Absolute: 0.8 10*3/uL (ref 0.1–1.0)
Monocytes Relative: 8 %
Neutro Abs: 5.7 10*3/uL (ref 1.7–7.7)
Neutrophils Relative %: 62 %

## 2021-07-30 LAB — CMP (CANCER CENTER ONLY)
ALT: 32 U/L (ref 0–44)
AST: 26 U/L (ref 15–41)
Albumin: 3.4 g/dL — ABNORMAL LOW (ref 3.5–5.0)
Alkaline Phosphatase: 49 U/L (ref 38–126)
Anion gap: 5 (ref 5–15)
BUN: 11 mg/dL (ref 8–23)
CO2: 30 mmol/L (ref 22–32)
Calcium: 8.9 mg/dL (ref 8.9–10.3)
Chloride: 103 mmol/L (ref 98–111)
Creatinine: 0.94 mg/dL (ref 0.61–1.24)
GFR, Estimated: >60 mL/min (ref >60)
Glucose, Bld: 79 mg/dL (ref 70–99)
Potassium: 3.7 mmol/L (ref 3.5–5.1)
Sodium: 138 mmol/L (ref 135–145)
Total Bilirubin: 0.4 mg/dL (ref 0.3–1.2)
Total Protein: 6.6 g/dL (ref 6.5–8.1)

## 2021-07-30 LAB — TSH: TSH: 1.038 u[IU]/mL (ref 0.320–4.118)

## 2021-07-30 LAB — CBC (CANCER CENTER ONLY)
HCT: 32.9 % — ABNORMAL LOW (ref 39.0–52.0)
Hemoglobin: 11.4 g/dL — ABNORMAL LOW (ref 13.0–17.0)
MCH: 29.1 pg (ref 26.0–34.0)
MCHC: 34.7 g/dL (ref 30.0–36.0)
MCV: 83.9 fL (ref 80.0–100.0)
Platelet Count: 127 10*3/uL — ABNORMAL LOW (ref 150–400)
RBC: 3.92 MIL/uL — ABNORMAL LOW (ref 4.22–5.81)
RDW: 18.4 % — ABNORMAL HIGH (ref 11.5–15.5)
WBC Count: 9.3 10*3/uL (ref 4.0–10.5)
nRBC: 0 % (ref 0.0–0.2)

## 2021-07-30 MED ORDER — SODIUM CHLORIDE 0.9% FLUSH: 10.0000 mL | INTRAVENOUS | Status: DC | PRN

## 2021-07-30 MED ORDER — HEPARIN SOD (PORK) LOCK FLUSH 100 UNIT/ML IV SOLN: 500.0000 [IU] | Freq: Once | INTRAVENOUS | Status: DC | PRN

## 2021-07-30 MED ORDER — SODIUM CHLORIDE 0.9% FLUSH
10.0000 mL | Freq: Once | INTRAVENOUS | Status: AC
  Administered 2021-07-30: 10 mL

## 2021-07-30 MED ORDER — PROCHLORPERAZINE MALEATE 10 MG PO TABS
10.0000 mg | ORAL_TABLET | Freq: Once | ORAL | Status: AC
  Administered 2021-07-30: 10 mg via ORAL
  Filled 2021-07-30: qty 1

## 2021-07-30 MED ORDER — SODIUM CHLORIDE 0.9 % IV SOLN
500.0000 mg/m2 | Freq: Once | INTRAVENOUS | Status: AC
  Administered 2021-07-30: 900 mg via INTRAVENOUS
  Filled 2021-07-30: qty 20

## 2021-07-30 MED ORDER — SODIUM CHLORIDE 0.9 % IV SOLN: Freq: Once | INTRAVENOUS | Status: AC

## 2021-07-30 NOTE — Progress Notes (Signed)
Ok to proceed with pemetrexed 900mg  today.

## 2021-07-30 NOTE — Progress Notes (Signed)
Hold Pembrolizumab today since pt on Prednisone per Cassie Heilingoetter, PA.  Kennith Center, Pharm.D., CPP 07/30/2021@11 :07 AM

## 2021-07-30 NOTE — Patient Instructions (Signed)
[facility]  Discharge Instructions: Thank you for choosing [facility] to provide your oncology and hematology care.   If you have a lab appointment with the [facility], please go directly to the [facility] and check in at the registration area.   Wear comfortable clothing and clothing appropriate for easy access to any Portacath or PICC line.   We strive to give you quality time with your provider. You may need to reschedule your appointment if you arrive late (15 or more minutes).  Arriving late affects you and other patients whose appointments are after yours.  Also, if you miss three or more appointments without notifying the office, you may be dismissed from the clinic at the providers discretion.      For prescription refill requests, have your pharmacy contact our office and allow 72 hours for refills to be completed.    Today you received the following chemotherapy and/or immunotherapy agents: Pemetrexed   To help prevent nausea and vomiting after your treatment, we encourage you to take your nausea medication as directed.  BELOW ARE SYMPTOMS THAT SHOULD BE REPORTED IMMEDIATELY: *FEVER GREATER THAN 100.4 F (38 C) OR HIGHER *CHILLS OR SWEATING *NAUSEA AND VOMITING THAT IS NOT CONTROLLED WITH YOUR NAUSEA MEDICATION *UNUSUAL SHORTNESS OF BREATH *UNUSUAL BRUISING OR BLEEDING *URINARY PROBLEMS (pain or burning when urinating, or frequent urination) *BOWEL PROBLEMS (unusual diarrhea, constipation, pain near the anus) TENDERNESS IN MOUTH AND THROAT WITH OR WITHOUT PRESENCE OF ULCERS (sore throat, sores in mouth, or a toothache) UNUSUAL RASH, SWELLING OR PAIN  UNUSUAL VAGINAL DISCHARGE OR ITCHING   Items with * indicate a potential emergency and should be followed up as soon as possible or go to the Emergency Department if any problems should occur.  Please show the CHEMOTHERAPY ALERT CARD or IMMUNOTHERAPY ALERT CARD at check-in to  the Emergency Department and triage nurse.  Should you have questions after your visit or need to cancel or reschedule your appointment, please contact [facility]  Dept: [phone]  and follow the prompts.  Office hours are 8:00 a.m. to 4:30 p.m. Monday - Friday. Please note that voicemails left after 4:00 p.m. may not be returned until the following business day.  We are closed weekends and major holidays. You have access to a nurse at all times for urgent questions. Please call the main number to the clinic Dept: [phone] and follow the prompts.   For any non-urgent questions, you may also contact your provider using MyChart. We now offer e-Visits for anyone 58 and older to request care online for non-urgent symptoms. For details visit [email or web address].   Also download the MyChart app! Go to the app store, search "MyChart", open the app, select [facility], and log in with your MyChart username and password.  Due to Covid, a mask is required upon entering the hospital/clinic. If you do not have a mask, one will be given to you upon arrival. For doctor visits, patients may have 1 support person aged 30 or older with them. For treatment visits, patients cannot have anyone with them due to current Covid guidelines and our immunocompromised population.

## 2021-07-31 ENCOUNTER — Encounter: Payer: Self-pay | Admitting: Radiation Therapy

## 2021-07-31 NOTE — Progress Notes (Signed)
Written reminder card including my contact information was mailed out to pt. regarding his April Brain MRI and telephone follow-up with Shona Simpson PA-C, to review the results.   Mont Dutton R.T.(R)(T) Radiation Special Procedures Navigator

## 2021-08-01 ENCOUNTER — Encounter: Payer: Self-pay | Admitting: Internal Medicine

## 2021-08-01 NOTE — Progress Notes (Signed)
Pt has been re enrolled w/ the [employer] program for Grenelefe for $25,000 from 06/16/21 - 06/15/22.  His copay for [name] will be $25.

## 2021-08-20 ENCOUNTER — Inpatient Hospital Stay (HOSPITAL_BASED_OUTPATIENT_CLINIC_OR_DEPARTMENT_OTHER): Payer: 59 | Admitting: Internal Medicine

## 2021-08-20 ENCOUNTER — Other Ambulatory Visit: Payer: Self-pay

## 2021-08-20 ENCOUNTER — Inpatient Hospital Stay: Payer: 59 | Attending: Internal Medicine

## 2021-08-20 ENCOUNTER — Inpatient Hospital Stay: Payer: 59

## 2021-08-20 ENCOUNTER — Encounter: Payer: Self-pay | Admitting: Internal Medicine

## 2021-08-20 VITALS — BP 96/55 | HR 84 | Temp 97.2°F | Resp 17 | Wt 173.3 lb

## 2021-08-20 DIAGNOSIS — C7931 Secondary malignant neoplasm of brain: Secondary | ICD-10-CM | POA: Insufficient documentation

## 2021-08-20 DIAGNOSIS — Z79899 Other long term (current) drug therapy: Secondary | ICD-10-CM | POA: Insufficient documentation

## 2021-08-20 DIAGNOSIS — C3491 Malignant neoplasm of unspecified part of right bronchus or lung: Secondary | ICD-10-CM

## 2021-08-20 DIAGNOSIS — Z5112 Encounter for antineoplastic immunotherapy: Secondary | ICD-10-CM

## 2021-08-20 DIAGNOSIS — Z87891 Personal history of nicotine dependence: Secondary | ICD-10-CM | POA: Diagnosis not present

## 2021-08-20 DIAGNOSIS — Z95828 Presence of other vascular implants and grafts: Secondary | ICD-10-CM

## 2021-08-20 DIAGNOSIS — C3411 Malignant neoplasm of upper lobe, right bronchus or lung: Secondary | ICD-10-CM | POA: Insufficient documentation

## 2021-08-20 DIAGNOSIS — Z5111 Encounter for antineoplastic chemotherapy: Secondary | ICD-10-CM

## 2021-08-20 DIAGNOSIS — C3490 Malignant neoplasm of unspecified part of unspecified bronchus or lung: Secondary | ICD-10-CM | POA: Diagnosis not present

## 2021-08-20 LAB — CBC WITH DIFFERENTIAL (CANCER CENTER ONLY)
Abs Immature Granulocytes: 0.04 10*3/uL (ref 0.00–0.07)
Basophils Absolute: 0 10*3/uL (ref 0.0–0.1)
Basophils Relative: 1 %
Eosinophils Absolute: 0.1 10*3/uL (ref 0.0–0.5)
Eosinophils Relative: 1 %
HCT: 32.9 % — ABNORMAL LOW (ref 39.0–52.0)
Hemoglobin: 11.1 g/dL — ABNORMAL LOW (ref 13.0–17.0)
Immature Granulocytes: 1 %
Lymphocytes Relative: 34 %
Lymphs Abs: 1.8 10*3/uL (ref 0.7–4.0)
MCH: 29.1 pg (ref 26.0–34.0)
MCHC: 33.7 g/dL (ref 30.0–36.0)
MCV: 86.4 fL (ref 80.0–100.0)
Monocytes Absolute: 0.8 10*3/uL (ref 0.1–1.0)
Monocytes Relative: 15 %
Neutro Abs: 2.6 10*3/uL (ref 1.7–7.7)
Neutrophils Relative %: 48 %
Platelet Count: 196 10*3/uL (ref 150–400)
RBC: 3.81 MIL/uL — ABNORMAL LOW (ref 4.22–5.81)
RDW: 16.6 % — ABNORMAL HIGH (ref 11.5–15.5)
WBC Count: 5.3 10*3/uL (ref 4.0–10.5)
nRBC: 0 % (ref 0.0–0.2)

## 2021-08-20 LAB — TSH: TSH: 1.319 u[IU]/mL (ref 0.320–4.118)

## 2021-08-20 LAB — CMP (CANCER CENTER ONLY)
ALT: 20 U/L (ref 0–44)
AST: 24 U/L (ref 15–41)
Albumin: 3.7 g/dL (ref 3.5–5.0)
Alkaline Phosphatase: 42 U/L (ref 38–126)
Anion gap: 3 — ABNORMAL LOW (ref 5–15)
BUN: 12 mg/dL (ref 8–23)
CO2: 28 mmol/L (ref 22–32)
Calcium: 9 mg/dL (ref 8.9–10.3)
Chloride: 106 mmol/L (ref 98–111)
Creatinine: 1.18 mg/dL (ref 0.61–1.24)
GFR, Estimated: >60 mL/min (ref >60)
Glucose, Bld: 96 mg/dL (ref 70–99)
Potassium: 3.7 mmol/L (ref 3.5–5.1)
Sodium: 137 mmol/L (ref 135–145)
Total Bilirubin: 0.3 mg/dL (ref 0.3–1.2)
Total Protein: 6.7 g/dL (ref 6.5–8.1)

## 2021-08-20 MED ORDER — SODIUM CHLORIDE 0.9 % IV SOLN
500.0000 mg/m2 | Freq: Once | INTRAVENOUS | Status: AC
  Administered 2021-08-20: 1000 mg via INTRAVENOUS
  Filled 2021-08-20: qty 40

## 2021-08-20 MED ORDER — SODIUM CHLORIDE 0.9% FLUSH
10.0000 mL | INTRAVENOUS | Status: DC | PRN
  Administered 2021-08-20: 10 mL

## 2021-08-20 MED ORDER — HEPARIN SOD (PORK) LOCK FLUSH 100 UNIT/ML IV SOLN
500.0000 [IU] | Freq: Once | INTRAVENOUS | Status: AC | PRN
  Administered 2021-08-20: 500 [IU]

## 2021-08-20 MED ORDER — SODIUM CHLORIDE 0.9 % IV SOLN: Freq: Once | INTRAVENOUS | Status: AC

## 2021-08-20 MED ORDER — PROCHLORPERAZINE MALEATE 10 MG PO TABS
10.0000 mg | ORAL_TABLET | Freq: Once | ORAL | Status: AC
  Administered 2021-08-20: 10 mg via ORAL
  Filled 2021-08-20: qty 1

## 2021-08-20 MED ORDER — CYANOCOBALAMIN 1000 MCG/ML IJ SOLN
1000.0000 ug | Freq: Once | INTRAMUSCULAR | Status: AC
  Administered 2021-08-20: 1000 ug via INTRAMUSCULAR
  Filled 2021-08-20: qty 1

## 2021-08-20 MED ORDER — SODIUM CHLORIDE 0.9 % IV SOLN
200.0000 mg | Freq: Once | INTRAVENOUS | Status: AC
  Administered 2021-08-20: 200 mg via INTRAVENOUS
  Filled 2021-08-20: qty 200

## 2021-08-20 MED ORDER — SODIUM CHLORIDE 0.9% FLUSH
10.0000 mL | Freq: Once | INTRAVENOUS | Status: AC
  Administered 2021-08-20: 10 mL

## 2021-08-20 NOTE — Progress Notes (Signed)
[facility] [phone]   [phone]  OFFICE PROGRESS NOTE  [name], MD [place] [address]  DIAGNOSIS: Stage IV (T3, N2, M1c) non-small cell lung cancer favoring adenocarcinoma presented with right upper lobe lung mass in addition to right lower lobe, left lower lobe in addition to right hilar and mediastinal lymphadenopathy in addition to metastatic brain lesions diagnosed in October 2021.  Molecular studies by [employer]:  [id number], 9.5%,   PRIOR THERAPY: SRS to 3 brain lesion under the care of Dr. Lisbeth Renshaw.  CURRENT THERAPY: Systemic chemotherapy with carboplatin for AUC of 5, Alimta 500 mg/M2 and Keytruda 200 mg IV every 3 weeks.  First dose April 24, 2020.  Status post 23 cycles.  Starting from cycle #5 the patient will be on maintenance treatment with Alimta and Keytruda every 3 weeks.  INTERVAL HISTORY: BION TODOROV 66 y.o. male returns to the clinic today for follow-up visit.  The patient is feeling fine today with no concerning complaints he has been tolerating his systemic maintenance chemotherapy fairly well.  He denied having any chest pain, shortness of breath, cough or hemoptysis.  He has no nausea, vomiting, diarrhea or constipation.  He has no headache or visual changes.  He has no recent weight loss or night sweats.  He is here today for evaluation before starting cycle #34.  MEDICAL HISTORY: Past Medical History:  Diagnosis Date   Anemia 06/24/2012   ANXIETY 02/03/2007   Cervical disc disease 02/12/2012   S/p surgury jan 2013   Erectile dysfunction 02/11/2011   GERD (gastroesophageal reflux disease)    GLUCOSE INTOLERANCE 01/04/2008   HYPERLIPIDEMIA 02/03/2007   HYPERTENSION 02/03/2007   IBS (irritable bowel syndrome)    Impaired glucose tolerance 02/11/2011   nscl ca 03/30/2020   Smoker 08/22/2014   Substance abuse ([place]) 2001   sober for 16 yrs    ALLERGIES:  has No Known  Allergies.  MEDICATIONS:  Current Outpatient Medications  Medication Sig Dispense Refill   amLODipine-benazepril (LOTREL) 10-20 MG capsule Take 1 capsule by mouth once daily 90 capsule 1   aspirin 325 MG EC tablet Take 1 tablet (325 mg total) by mouth daily. 90 tablet 99   b complex vitamins tablet Take 1 tablet by mouth daily.     cholecalciferol (VITAMIN D3) 25 MCG (1000 UNIT) tablet Take 1,000 Units by mouth daily.     folic acid (FOLVITE) 1 MG tablet Take 1 tablet (1 mg total) by mouth daily. 90 tablet 3   Garlic 9379 MG CAPS Take 1,000 mg by mouth daily.      lidocaine-prilocaine (EMLA) cream Apply to the Port-A-Cath site 30-60 minutes before chemotherapy. 30 g 0   Omega-3 Fatty Acids (FISH OIL PO) Take 1 capsule by mouth daily.      polyethylene glycol (MIRALAX / GLYCOLAX) 17 g packet Take 17 g by mouth daily as needed.     predniSONE (DELTASONE) 20 MG tablet 4 tablets p.o. daily for 5 days followed by 3 tablet p.o. daily for 5 days followed by 3 tablets p.o. daily for 5 days followed by 2 tablet p.o. daily for 5 days followed by 1 tablet p.o. daily for 5 days, followed by 0.5 tablet p.o. daily for 5 days. (Patient not taking: Reported on 07/30/2021) 53 tablet 0   prochlorperazine (COMPAZINE) 10 MG tablet Take 1 tablet (10 mg total) by mouth every 6 (six) hours as needed for nausea  or vomiting. (Patient not taking: Reported on 07/30/2021) 30 tablet 0   rosuvastatin (CRESTOR) 20 MG tablet Take 1 tablet (20 mg total) by mouth daily. 90 tablet 3   vardenafil (LEVITRA) 20 MG tablet TAKE 1 TABLET BY MOUTH AS NEEDED FOR  ERECTILE  DYSFUNCTION (Patient not taking: Reported on 07/30/2021) 10 tablet 5   No current facility-administered medications for this visit.    SURGICAL HISTORY:  Past Surgical History:  Procedure Laterality Date   BRONCHIAL BIOPSY  03/30/2020   Procedure: BRONCHIAL BIOPSIES;  Surgeon: Garner Nash, DO;  Location: [facility];  Service: Pulmonary;;   BRONCHIAL BRUSHINGS   03/30/2020   Procedure: BRONCHIAL BRUSHINGS;  Surgeon: Garner Nash, DO;  Location: [facility];  Service: Pulmonary;;   BRONCHIAL NEEDLE ASPIRATION BIOPSY  03/30/2020   Procedure: BRONCHIAL NEEDLE ASPIRATION BIOPSIES;  Surgeon: Garner Nash, DO;  Location: [facility];  Service: Pulmonary;;   BRONCHIAL WASHINGS  03/30/2020   Procedure: BRONCHIAL WASHINGS;  Surgeon: Garner Nash, DO;  Location: [facility];  Service: Pulmonary;;   COLONOSCOPY  2007   IR IMAGING GUIDED PORT INSERTION  04/23/2020   neck fusion  2012   C4   NO PAST SURGERIES     VIDEO BRONCHOSCOPY WITH ENDOBRONCHIAL NAVIGATION N/A 03/30/2020   Procedure: VIDEO BRONCHOSCOPY WITH ENDOBRONCHIAL NAVIGATION;  Surgeon: Garner Nash, DO;  Location: [facility];  Service: Pulmonary;  Laterality: N/A;   VIDEO BRONCHOSCOPY WITH ENDOBRONCHIAL ULTRASOUND N/A 03/30/2020   Procedure: VIDEO BRONCHOSCOPY WITH ENDOBRONCHIAL ULTRASOUND;  Surgeon: Garner Nash, DO;  Location: [facility];  Service: Pulmonary;  Laterality: N/A;    REVIEW OF SYSTEMS:  A comprehensive review of systems was negative.   PHYSICAL EXAMINATION: General appearance: alert, cooperative, and no distress Head: Normocephalic, without obvious abnormality, atraumatic Neck: no adenopathy, no JVD, supple, symmetrical, trachea midline, and thyroid not enlarged, symmetric, no tenderness/mass/nodules Lymph nodes: Cervical, supraclavicular, and axillary nodes normal. Resp: clear to auscultation bilaterally Back: symmetric, no curvature. ROM normal. No CVA tenderness. Cardio: regular rate and rhythm, S1, S2 normal, no murmur, click, rub or gallop GI: soft, non-tender; bowel sounds normal; no masses,  no organomegaly Extremities: extremities normal, atraumatic, no cyanosis or edema  ECOG PERFORMANCE STATUS: 0 - Asymptomatic  Blood pressure (!) 96/55, pulse 84, temperature (!) 97.2 F (36.2 C), temperature source Tympanic, resp. rate 17, weight 173 lb 5 oz  (78.6 kg), SpO2 100 %.  LABORATORY DATA: Lab Results  Component Value Date   WBC 5.3 08/20/2021   HGB 11.1 (L) 08/20/2021   HCT 32.9 (L) 08/20/2021   MCV 86.4 08/20/2021   PLT 196 08/20/2021      Chemistry      Component Value Date/Time   NA 137 08/20/2021 1032   K 3.7 08/20/2021 1032   CL 106 08/20/2021 1032   CO2 28 08/20/2021 1032   BUN 12 08/20/2021 1032   CREATININE 1.18 08/20/2021 1032   CREATININE 0.88 02/21/2020 1115      Component Value Date/Time   CALCIUM 9.0 08/20/2021 1032   ALKPHOS 42 08/20/2021 1032   AST 24 08/20/2021 1032   ALT 20 08/20/2021 1032   BILITOT 0.3 08/20/2021 1032       RADIOGRAPHIC STUDIES: No results found.   ASSESSMENT AND PLAN: This is a very pleasant 66 years old African-American male recently diagnosed with a stage IV (T3, N2, M1c)  non-small cell lung cancer, adenocarcinoma presented with right upper lobe lung mass in addition to right lower lobe,  left lower lobe in addition to right hilar and mediastinal lymphadenopathy in addition to metastatic brain lesions diagnosed in October 2021. The patient has molecular studies by [employer] that showed no actionable mutations. He underwent SRS treatment to his brain lesion. The patient is currently undergoing systemic chemotherapy with carboplatin for AUC of 5, Alimta 500 mg/M2 and Keytruda 200 mg IV every 3 weeks status post 23 cycles.  Starting from cycle #5 the patient is on maintenance treatment with Alimta and Keytruda every 3 weeks. The patient has been tolerating this treatment well with no concerning adverse effects. I recommended for him to proceed with cycle #24 today as planned. I will see him back for follow-up visit in 3 weeks for evaluation with repeat CT scan of the chest, abdomen and pelvis for restaging of his disease before cycle #25. The patient was advised to call immediately if he has any other concerning symptoms in the interval. The patient voices understanding of  current disease status and treatment options and is in agreement with the current care plan.  All questions were answered. The patient knows to call the clinic with any problems, questions or concerns. We can certainly see the patient much sooner if necessary.  Disclaimer: This note was dictated with voice recognition software. Similar sounding words can inadvertently be transcribed and may not be corrected upon review.

## 2021-08-20 NOTE — Progress Notes (Signed)
Per Dr. Julien Nordmann he said to increase Alimta to 1000 mg  because of new weight. ?

## 2021-08-20 NOTE — Patient Instructions (Signed)
[facility]  Discharge Instructions: Thank you for choosing [facility] to provide your oncology and hematology care.   If you have a lab appointment with the Cancer Center, please go directly to the Cancer Center and check in at the registration area.   Wear comfortable clothing and clothing appropriate for easy access to any Portacath or PICC line.   We strive to give you quality time with your provider. You may need to reschedule your appointment if you arrive late (15 or more minutes).  Arriving late affects you and other patients whose appointments are after yours.  Also, if you miss three or more appointments without notifying the office, you may be dismissed from the clinic at the provider's discretion.      For prescription refill requests, have your pharmacy contact our office and allow 72 hours for refills to be completed.    Today you received the following chemotherapy and/or immunotherapy agents: Keytruda/Alimta.      To help prevent nausea and vomiting after your treatment, we encourage you to take your nausea medication as directed.  BELOW ARE SYMPTOMS THAT SHOULD BE REPORTED IMMEDIATELY: *FEVER GREATER THAN 100.4 F (38 C) OR HIGHER *CHILLS OR SWEATING *NAUSEA AND VOMITING THAT IS NOT CONTROLLED WITH YOUR NAUSEA MEDICATION *UNUSUAL SHORTNESS OF BREATH *UNUSUAL BRUISING OR BLEEDING *URINARY PROBLEMS (pain or burning when urinating, or frequent urination) *BOWEL PROBLEMS (unusual diarrhea, constipation, pain near the anus) TENDERNESS IN MOUTH AND THROAT WITH OR WITHOUT PRESENCE OF ULCERS (sore throat, sores in mouth, or a toothache) UNUSUAL RASH, SWELLING OR PAIN  UNUSUAL VAGINAL DISCHARGE OR ITCHING   Items with * indicate a potential emergency and should be followed up as soon as possible or go to the Emergency Department if any problems should occur.  Please show the CHEMOTHERAPY ALERT CARD or IMMUNOTHERAPY ALERT CARD at  check-in to the Emergency Department and triage nurse.  Should you have questions after your visit or need to cancel or reschedule your appointment, please contact [facility]  Dept: [phone]  and follow the prompts.  Office hours are 8:00 a.m. to 4:30 p.m. Monday - Friday. Please note that voicemails left after 4:00 p.m. may not be returned until the following business day.  We are closed weekends and major holidays. You have access to a nurse at all times for urgent questions. Please call the main number to the clinic Dept: [phone] and follow the prompts.   For any non-urgent questions, you may also contact your provider using MyChart. We now offer e-Visits for anyone 18 and older to request care online for non-urgent symptoms. For details visit [email or web address].   Also download the MyChart app! Go to the app store, search "MyChart", open the app, select [facility], and log in with your MyChart username and password.  Due to Covid, a mask is required upon entering the hospital/clinic. If you do not have a mask, one will be given to you upon arrival. For doctor visits, patients may have 1 support person aged 18 or older with them. For treatment visits, patients cannot have anyone with them due to current Covid guidelines and our immunocompromised population.   

## 2021-08-26 ENCOUNTER — Other Ambulatory Visit (INDEPENDENT_AMBULATORY_CARE_PROVIDER_SITE_OTHER): Payer: 59

## 2021-08-26 DIAGNOSIS — E559 Vitamin D deficiency, unspecified: Secondary | ICD-10-CM

## 2021-08-26 DIAGNOSIS — E7800 Pure hypercholesterolemia, unspecified: Secondary | ICD-10-CM

## 2021-08-26 DIAGNOSIS — Z0001 Encounter for general adult medical examination with abnormal findings: Secondary | ICD-10-CM

## 2021-08-26 DIAGNOSIS — R7302 Impaired glucose tolerance (oral): Secondary | ICD-10-CM | POA: Diagnosis not present

## 2021-08-26 LAB — CBC WITH DIFFERENTIAL/PLATELET
Basophils Absolute: 0 10*3/uL (ref 0.0–0.1)
Basophils Relative: 1.1 % (ref 0.0–3.0)
Eosinophils Absolute: 0.1 10*3/uL (ref 0.0–0.7)
Eosinophils Relative: 1.7 % (ref 0.0–5.0)
HCT: 36.3 % — ABNORMAL LOW (ref 39.0–52.0)
Hemoglobin: 12 g/dL — ABNORMAL LOW (ref 13.0–17.0)
Lymphocytes Relative: 40.3 % (ref 12.0–46.0)
Lymphs Abs: 1.6 10*3/uL (ref 0.7–4.0)
MCHC: 33.2 g/dL (ref 30.0–36.0)
MCV: 89.2 fl (ref 78.0–100.0)
Monocytes Absolute: 0.2 10*3/uL (ref 0.1–1.0)
Monocytes Relative: 6.1 % (ref 3.0–12.0)
Neutro Abs: 2 10*3/uL (ref 1.4–7.7)
Neutrophils Relative %: 50.8 % (ref 43.0–77.0)
Platelets: 152 10*3/uL (ref 150.0–400.0)
RBC: 4.07 Mil/uL — ABNORMAL LOW (ref 4.22–5.81)
RDW: 16.1 % — ABNORMAL HIGH (ref 11.5–15.5)
WBC: 3.9 10*3/uL — ABNORMAL LOW (ref 4.0–10.5)

## 2021-08-26 LAB — BASIC METABOLIC PANEL
BUN: 11 mg/dL (ref 6–23)
CO2: 26 mEq/L (ref 19–32)
Calcium: 9.2 mg/dL (ref 8.4–10.5)
Chloride: 102 mEq/L (ref 96–112)
Creatinine, Ser: 1.13 mg/dL (ref 0.40–1.50)
GFR: 68.69 mL/min (ref >60.00)
Glucose, Bld: 115 mg/dL — ABNORMAL HIGH (ref 70–99)
Potassium: 4.5 mEq/L (ref 3.5–5.1)
Sodium: 136 mEq/L (ref 135–145)

## 2021-08-26 LAB — URINALYSIS, ROUTINE W REFLEX MICROSCOPIC
Bilirubin Urine: NEGATIVE
Hgb urine dipstick: NEGATIVE
Ketones, ur: NEGATIVE
Leukocytes,Ua: NEGATIVE
Nitrite: NEGATIVE
RBC / HPF: NONE SEEN (ref 0–?)
Specific Gravity, Urine: 1.02 (ref 1.000–1.030)
Total Protein, Urine: NEGATIVE
Urine Glucose: NEGATIVE
Urobilinogen, UA: 0.2 (ref 0.0–1.0)
pH: 5.5 (ref 5.0–8.0)

## 2021-08-26 LAB — LIPID PANEL
Cholesterol: 186 mg/dL (ref 0–200)
HDL: 76.9 mg/dL (ref >39.00)
LDL Cholesterol: 98 mg/dL (ref 0–99)
NonHDL: 108.92
Total CHOL/HDL Ratio: 2
Triglycerides: 57 mg/dL (ref 0.0–149.0)
VLDL: 11.4 mg/dL (ref 0.0–40.0)

## 2021-08-26 LAB — TSH: TSH: 1.67 u[IU]/mL (ref 0.35–5.50)

## 2021-08-26 LAB — HEPATIC FUNCTION PANEL
ALT: 37 U/L (ref 0–53)
AST: 44 U/L — ABNORMAL HIGH (ref 0–37)
Albumin: 3.9 g/dL (ref 3.5–5.2)
Alkaline Phosphatase: 45 U/L (ref 39–117)
Bilirubin, Direct: 0.1 mg/dL (ref 0.0–0.3)
Total Bilirubin: 0.6 mg/dL (ref 0.2–1.2)
Total Protein: 6.6 g/dL (ref 6.0–8.3)

## 2021-08-26 LAB — HEMOGLOBIN A1C: Hgb A1c MFr Bld: 6.2 % (ref 4.6–6.5)

## 2021-08-26 LAB — PSA: PSA: 0.37 ng/mL (ref 0.10–4.00)

## 2021-08-26 LAB — VITAMIN D 25 HYDROXY (VIT D DEFICIENCY, FRACTURES): VITD: 53.91 ng/mL (ref 30.00–100.00)

## 2021-08-27 ENCOUNTER — Encounter: Payer: Self-pay | Admitting: Internal Medicine

## 2021-08-27 ENCOUNTER — Other Ambulatory Visit: Payer: Self-pay

## 2021-08-27 ENCOUNTER — Ambulatory Visit: Payer: 59 | Admitting: Internal Medicine

## 2021-08-27 VITALS — BP 120/64 | HR 77 | Ht 72.0 in | Wt 175.0 lb

## 2021-08-27 DIAGNOSIS — R7302 Impaired glucose tolerance (oral): Secondary | ICD-10-CM | POA: Diagnosis not present

## 2021-08-27 DIAGNOSIS — E7800 Pure hypercholesterolemia, unspecified: Secondary | ICD-10-CM | POA: Diagnosis not present

## 2021-08-27 DIAGNOSIS — I10 Essential (primary) hypertension: Secondary | ICD-10-CM

## 2021-08-27 DIAGNOSIS — Z0001 Encounter for general adult medical examination with abnormal findings: Secondary | ICD-10-CM | POA: Diagnosis not present

## 2021-08-27 DIAGNOSIS — E559 Vitamin D deficiency, unspecified: Secondary | ICD-10-CM

## 2021-08-27 DIAGNOSIS — E538 Deficiency of other specified B group vitamins: Secondary | ICD-10-CM

## 2021-08-27 MED ORDER — ROSUVASTATIN CALCIUM 40 MG PO TABS: 40.0000 mg | ORAL_TABLET | Freq: Every day | ORAL | 3 refills | Status: DC

## 2021-08-27 NOTE — Assessment & Plan Note (Signed)
Last vitamin D ?Lab Results  ?Component Value Date  ? VD25OH 53.91 08/26/2021  ? ?Stable, cont oral replacement ? ?

## 2021-08-27 NOTE — Assessment & Plan Note (Signed)
Lab Results  ?Component Value Date  ? HGBA1C 6.2 08/26/2021  ? ?Stable, pt to continue current medical treatment  - diet ? ?

## 2021-08-27 NOTE — Assessment & Plan Note (Signed)

## 2021-08-27 NOTE — Patient Instructions (Signed)
You are given the [facility] Duty note for your wife ? ?Ok to increase the crestor to 40 mg per day ? ?Please continue all other medications as before, and refills have been done if requested. ? ?Please have the pharmacy call with any other refills you may need. ? ?Please continue your efforts at being more active, low cholesterol diet, and weight control. ? ?You are otherwise up to date with prevention measures today. ? ?Please keep your appointments with your specialists as you may have planned ? ?Please make an Appointment to return in 6 months, or sooner if needed ?

## 2021-08-27 NOTE — Assessment & Plan Note (Signed)
BP Readings from Last 3 Encounters:  ?08/27/21 120/64  ?08/20/21 (!) 96/55  ?07/30/21 109/69  ? ?Stable, pt to continue medical treatment lotrel ? ?

## 2021-08-27 NOTE — Assessment & Plan Note (Signed)
Lab Results  ?Component Value Date  ? [place] 98 08/26/2021  ? ?Uncontrolled, pt to increase the crestor to 40 mg , lower chol diet ? ?

## 2021-08-27 NOTE — Assessment & Plan Note (Signed)
Lab Results  ?Component Value Date  ? [id number] 1,278 (H) 02/22/2021  ? ?Stable, cont oral replacement - b12 1000 mcg qd ? ?

## 2021-08-27 NOTE — Progress Notes (Signed)
Patient ID: Nicolas Allen, male   DOB: [birth year], 66 y.o.   MRN: [id number]         Chief Complaint:: wellness exam and Follow-up  Hld, low b12 sand D, hyperglycemia, htn       HPI:  Nicolas Allen is a 66 y.o. male here for wellness exam; already up to date. Quit smoking just over 1 yr.                         Also Pt denies chest pain, increased sob or doe, wheezing, orthopnea, PND, increased LE swelling, palpitations, dizziness or syncope.   Pt denies polydipsia, polyuria, or new focal neuro s/s.    Pt denies fever, wt loss, night sweats, loss of appetite, or other constitutional symptoms  Follows closely recently with heme.onc.    No other new complaints   Wt Readings from Last 3 Encounters:  08/27/21 175 lb (79.4 kg)  08/20/21 173 lb 5 oz (78.6 kg)  07/30/21 172 lb 8 oz (78.2 kg)   BP Readings from Last 3 Encounters:  08/27/21 120/64  08/20/21 (!) 96/55  07/30/21 109/69   Immunization History  Administered Date(s) Administered   Influenza,inj,Quad PF,6+ Mos 06/30/2018, 02/24/2020, 02/26/2021   PFIZER(Purple Top)SARS-COV-2 Vaccination 09/02/2019, 09/27/2019, 04/09/2020   Td 12/13/2008   Tdap 02/23/2019   Zoster Recombinat (Shingrix) 02/26/2021, 04/29/2021  There are no preventive care reminders to display for this patient.    Past Medical History:  Diagnosis Date   Anemia 06/24/2012   ANXIETY 02/03/2007   Cervical disc disease 02/12/2012   S/p surgury jan 2013   Erectile dysfunction 02/11/2011   GERD (gastroesophageal reflux disease)    GLUCOSE INTOLERANCE 01/04/2008   HYPERLIPIDEMIA 02/03/2007   HYPERTENSION 02/03/2007   IBS (irritable bowel syndrome)    Impaired glucose tolerance 02/11/2011   nscl ca 03/30/2020   Smoker 08/22/2014   Substance abuse ([facility]) 2001   sober for 16 yrs   Past Surgical History:  Procedure Laterality Date   BRONCHIAL BIOPSY  03/30/2020   Procedure: BRONCHIAL BIOPSIES;  Surgeon: Josephine Igo, DO;  Location: [facility];  Service:  Pulmonary;;   BRONCHIAL BRUSHINGS  03/30/2020   Procedure: BRONCHIAL BRUSHINGS;  Surgeon: Josephine Igo, DO;  Location: [facility];  Service: Pulmonary;;   BRONCHIAL NEEDLE ASPIRATION BIOPSY  03/30/2020   Procedure: BRONCHIAL NEEDLE ASPIRATION BIOPSIES;  Surgeon: Josephine Igo, DO;  Location: [facility];  Service: Pulmonary;;   BRONCHIAL WASHINGS  03/30/2020   Procedure: BRONCHIAL WASHINGS;  Surgeon: Josephine Igo, DO;  Location: [facility];  Service: Pulmonary;;   COLONOSCOPY  2007   IR IMAGING GUIDED PORT INSERTION  04/23/2020   neck fusion  2012   C4   NO PAST SURGERIES     VIDEO BRONCHOSCOPY WITH ENDOBRONCHIAL NAVIGATION N/A 03/30/2020   Procedure: VIDEO BRONCHOSCOPY WITH ENDOBRONCHIAL NAVIGATION;  Surgeon: Josephine Igo, DO;  Location: [facility];  Service: Pulmonary;  Laterality: N/A;   VIDEO BRONCHOSCOPY WITH ENDOBRONCHIAL ULTRASOUND N/A 03/30/2020   Procedure: VIDEO BRONCHOSCOPY WITH ENDOBRONCHIAL ULTRASOUND;  Surgeon: Josephine Igo, DO;  Location: [facility];  Service: Pulmonary;  Laterality: N/A;    reports that he quit smoking about 18 months ago. His smoking use included cigarettes. He has a 45.00 pack-year smoking history. He has quit using smokeless tobacco.  His smokeless tobacco use included chew. He reports that he does not drink alcohol and does not use drugs. family history includes Cancer in  his cousin and mother; Hypertension in an other family member; Stroke in an other family member. No Known Allergies Current Outpatient Medications on File Prior to Visit  Medication Sig Dispense Refill   amLODipine-benazepril (LOTREL) 10-20 MG capsule Take 1 capsule by mouth once daily 90 capsule 1   aspirin 325 MG EC tablet Take 1 tablet (325 mg total) by mouth daily. 90 tablet 99   b complex vitamins tablet Take 1 tablet by mouth daily.     cholecalciferol (VITAMIN D3) 25 MCG (1000 UNIT) tablet Take 1,000 Units by mouth daily.     folic acid (FOLVITE) 1 MG  tablet Take 1 tablet (1 mg total) by mouth daily. 90 tablet 3   Garlic 1000 MG CAPS Take 1,000 mg by mouth daily.      lidocaine-prilocaine (EMLA) cream Apply to the Port-A-Cath site 30-60 minutes before chemotherapy. 30 g 0   Omega-3 Fatty Acids (FISH OIL PO) Take 1 capsule by mouth daily.      polyethylene glycol (MIRALAX / GLYCOLAX) 17 g packet Take 17 g by mouth daily as needed.     prochlorperazine (COMPAZINE) 10 MG tablet Take 1 tablet (10 mg total) by mouth every 6 (six) hours as needed for nausea or vomiting. 30 tablet 0   vardenafil (LEVITRA) 20 MG tablet TAKE 1 TABLET BY MOUTH AS NEEDED FOR  ERECTILE  DYSFUNCTION 10 tablet 5   No current facility-administered medications on file prior to visit.        ROS:  All others reviewed and negative.  Objective        PE:  BP 120/64 (BP Location: Right Arm, Patient Position: Sitting, Cuff Size: Large)   Pulse 77   Ht 6' (1.829 m)   Wt 175 lb (79.4 kg)   SpO2 100%   BMI 23.73 kg/m                 Constitutional: Pt appears in NAD               HENT: Head: NCAT.                Right Ear: External ear normal.                 Left Ear: External ear normal.                Eyes: . Pupils are equal, round, and reactive to light. Conjunctivae and EOM are normal               Nose: without d/c or deformity               Neck: Neck supple. Gross normal ROM               Cardiovascular: Normal rate and regular rhythm.                 Pulmonary/Chest: Effort normal and breath sounds without rales or wheezing.                Abd:  Soft, NT, ND, + BS, no organomegaly               Neurological: Pt is alert. At baseline orientation, motor grossly intact               Skin: Skin is warm. No rashes, no other new lesions, LE edema - none               Psychiatric: Pt  behavior is normal without agitation   Micro: none  Cardiac tracings I have personally interpreted today:  none  Pertinent Radiological findings (summarize): none   Lab Results   Component Value Date   WBC 3.9 (L) 08/26/2021   HGB 12.0 (L) 08/26/2021   HCT 36.3 (L) 08/26/2021   PLT 152.0 08/26/2021   GLUCOSE 115 (H) 08/26/2021   CHOL 186 08/26/2021   TRIG 57.0 08/26/2021   HDL 76.90 08/26/2021   LDLCALC 98 08/26/2021   ALT 37 08/26/2021   AST 44 (H) 08/26/2021   NA 136 08/26/2021   K 4.5 08/26/2021   CL 102 08/26/2021   CREATININE 1.13 08/26/2021   BUN 11 08/26/2021   CO2 26 08/26/2021   TSH 1.67 08/26/2021   PSA 0.37 08/26/2021   INR 1.0 03/30/2020   HGBA1C 6.2 08/26/2021   Assessment/Plan:  Nicolas Allen is a 66 y.o. Black or African American [2] male with  has a past medical history of Anemia (06/24/2012), ANXIETY (02/03/2007), Cervical disc disease (02/12/2012), Erectile dysfunction (02/11/2011), GERD (gastroesophageal reflux disease), GLUCOSE INTOLERANCE (01/04/2008), HYPERLIPIDEMIA (02/03/2007), HYPERTENSION (02/03/2007), IBS (irritable bowel syndrome), Impaired glucose tolerance (02/11/2011), nscl ca (03/30/2020), Smoker (08/22/2014), and Substance abuse ([facility]) (2001).  Vitamin D deficiency Last vitamin D Lab Results  Component Value Date   VD25OH 53.91 08/26/2021   Stable, cont oral replacement   B12 deficiency Lab Results  Component Value Date   VITAMINB12 1,278 (H) 02/22/2021   Stable, cont oral replacement - b12 1000 mcg qd   Encounter for well adult exam with abnormal findings Age and sex appropriate education and counseling updated with regular exercise and diet Referrals for preventative services - none needed Immunizations addressed - none needed Smoking counseling  - none needed Evidence for depression or other mood disorder - none significant Most recent labs reviewed. I have personally reviewed and have noted: 1) the patient's medical and social history 2) The patient's current medications and supplements 3) The patient's height, weight, and BMI have been recorded in the chart   Impaired glucose tolerance Lab  Results  Component Value Date   HGBA1C 6.2 08/26/2021   Stable, pt to continue current medical treatment  - diet   Hyperlipidemia Lab Results  Component Value Date   LDLCALC 98 08/26/2021   Uncontrolled, pt to increase the crestor to 40 mg , lower chol diet   Essential hypertension BP Readings from Last 3 Encounters:  08/27/21 120/64  08/20/21 (!) 96/55  07/30/21 109/69   Stable, pt to continue medical treatment lotrel  Followup: No follow-ups on file.  Oliver Barre, MD 08/27/2021 8:37 AM [facility]

## 2021-09-06 ENCOUNTER — Other Ambulatory Visit: Payer: Self-pay

## 2021-09-06 ENCOUNTER — Ambulatory Visit (HOSPITAL_COMMUNITY)
Admission: RE | Admit: 2021-09-06 | Discharge: 2021-09-06 | Disposition: A | Discharge status: Home / Self Care | Payer: 59 | Source: Ambulatory Visit | Attending: Internal Medicine | Admitting: Internal Medicine

## 2021-09-06 ENCOUNTER — Encounter (HOSPITAL_COMMUNITY): Payer: Self-pay

## 2021-09-06 DIAGNOSIS — C3490 Malignant neoplasm of unspecified part of unspecified bronchus or lung: Secondary | ICD-10-CM | POA: Insufficient documentation

## 2021-09-06 MED ORDER — IOHEXOL 300 MG/ML  SOLN
100.0000 mL | Freq: Once | INTRAMUSCULAR | Status: AC | PRN
  Administered 2021-09-06: 100 mL via INTRAVENOUS

## 2021-09-06 MED ORDER — SODIUM CHLORIDE (PF) 0.9 % IJ SOLN
INTRAMUSCULAR | Status: AC
  Filled 2021-09-06: qty 50

## 2021-09-06 MED ORDER — HEPARIN SOD (PORK) LOCK FLUSH 100 UNIT/ML IV SOLN
500.0000 [IU] | Freq: Once | INTRAVENOUS | Status: AC
  Administered 2021-09-06: 500 [IU] via INTRAVENOUS

## 2021-09-06 MED ORDER — HEPARIN SOD (PORK) LOCK FLUSH 100 UNIT/ML IV SOLN
INTRAVENOUS | Status: AC
  Filled 2021-09-06: qty 5

## 2021-09-10 ENCOUNTER — Inpatient Hospital Stay: Payer: 59 | Admitting: Internal Medicine

## 2021-09-10 ENCOUNTER — Inpatient Hospital Stay: Payer: 59

## 2021-09-10 ENCOUNTER — Other Ambulatory Visit: Payer: Self-pay

## 2021-09-10 VITALS — BP 113/66 | HR 74 | Temp 98.3°F | Resp 16 | Wt 175.5 lb

## 2021-09-10 DIAGNOSIS — C3491 Malignant neoplasm of unspecified part of right bronchus or lung: Secondary | ICD-10-CM

## 2021-09-10 DIAGNOSIS — C7931 Secondary malignant neoplasm of brain: Secondary | ICD-10-CM

## 2021-09-10 DIAGNOSIS — Z95828 Presence of other vascular implants and grafts: Secondary | ICD-10-CM

## 2021-09-10 DIAGNOSIS — Z5112 Encounter for antineoplastic immunotherapy: Secondary | ICD-10-CM

## 2021-09-10 DIAGNOSIS — Z5111 Encounter for antineoplastic chemotherapy: Secondary | ICD-10-CM

## 2021-09-10 LAB — CBC WITH DIFFERENTIAL (CANCER CENTER ONLY)
Abs Immature Granulocytes: 0.02 10*3/uL (ref 0.00–0.07)
Basophils Absolute: 0 10*3/uL (ref 0.0–0.1)
Basophils Relative: 0 %
Eosinophils Absolute: 0.2 10*3/uL (ref 0.0–0.5)
Eosinophils Relative: 4 %
HCT: 33.2 % — ABNORMAL LOW (ref 39.0–52.0)
Hemoglobin: 11.4 g/dL — ABNORMAL LOW (ref 13.0–17.0)
Immature Granulocytes: 0 %
Lymphocytes Relative: 38 %
Lymphs Abs: 2.2 10*3/uL (ref 0.7–4.0)
MCH: 29.2 pg (ref 26.0–34.0)
MCHC: 34.3 g/dL (ref 30.0–36.0)
MCV: 85.1 fL (ref 80.0–100.0)
Monocytes Absolute: 0.5 10*3/uL (ref 0.1–1.0)
Monocytes Relative: 9 %
Neutro Abs: 2.8 10*3/uL (ref 1.7–7.7)
Neutrophils Relative %: 49 %
Platelet Count: 137 10*3/uL — ABNORMAL LOW (ref 150–400)
RBC: 3.9 MIL/uL — ABNORMAL LOW (ref 4.22–5.81)
RDW: 16.5 % — ABNORMAL HIGH (ref 11.5–15.5)
WBC Count: 5.7 10*3/uL (ref 4.0–10.5)
nRBC: 0 % (ref 0.0–0.2)

## 2021-09-10 LAB — CMP (CANCER CENTER ONLY)
ALT: 21 U/L (ref 0–44)
AST: 26 U/L (ref 15–41)
Albumin: 3.5 g/dL (ref 3.5–5.0)
Alkaline Phosphatase: 44 U/L (ref 38–126)
Anion gap: 6 (ref 5–15)
BUN: 13 mg/dL (ref 8–23)
CO2: 26 mmol/L (ref 22–32)
Calcium: 8.8 mg/dL — ABNORMAL LOW (ref 8.9–10.3)
Chloride: 105 mmol/L (ref 98–111)
Creatinine: 1.21 mg/dL (ref 0.61–1.24)
GFR, Estimated: >60 mL/min (ref >60)
Glucose, Bld: 168 mg/dL — ABNORMAL HIGH (ref 70–99)
Potassium: 3.9 mmol/L (ref 3.5–5.1)
Sodium: 137 mmol/L (ref 135–145)
Total Bilirubin: 0.4 mg/dL (ref 0.3–1.2)
Total Protein: 6.6 g/dL (ref 6.5–8.1)

## 2021-09-10 LAB — TSH: TSH: 2.238 u[IU]/mL (ref 0.320–4.118)

## 2021-09-10 MED ORDER — SODIUM CHLORIDE 0.9% FLUSH
10.0000 mL | INTRAVENOUS | Status: DC | PRN
  Administered 2021-09-10: 10 mL

## 2021-09-10 MED ORDER — PROCHLORPERAZINE MALEATE 10 MG PO TABS
10.0000 mg | ORAL_TABLET | Freq: Once | ORAL | Status: AC
  Administered 2021-09-10: 10 mg via ORAL
  Filled 2021-09-10: qty 1

## 2021-09-10 MED ORDER — HEPARIN SOD (PORK) LOCK FLUSH 100 UNIT/ML IV SOLN
500.0000 [IU] | Freq: Once | INTRAVENOUS | Status: AC | PRN
  Administered 2021-09-10: 500 [IU]

## 2021-09-10 MED ORDER — SODIUM CHLORIDE 0.9 % IV SOLN
500.0000 mg/m2 | Freq: Once | INTRAVENOUS | Status: AC
  Administered 2021-09-10: 1000 mg via INTRAVENOUS
  Filled 2021-09-10: qty 40

## 2021-09-10 MED ORDER — SODIUM CHLORIDE 0.9% FLUSH
10.0000 mL | Freq: Once | INTRAVENOUS | Status: AC
  Administered 2021-09-10: 10 mL

## 2021-09-10 MED ORDER — SODIUM CHLORIDE 0.9 % IV SOLN
200.0000 mg | Freq: Once | INTRAVENOUS | Status: AC
  Administered 2021-09-10: 200 mg via INTRAVENOUS
  Filled 2021-09-10: qty 200

## 2021-09-10 MED ORDER — SODIUM CHLORIDE 0.9 % IV SOLN: Freq: Once | INTRAVENOUS | Status: AC

## 2021-09-10 NOTE — Progress Notes (Signed)
?    [facility] ?[phone]   [phone] ? ?OFFICE PROGRESS NOTE ? ?[name], MD ?[place] [address] ? ?DIAGNOSIS: Stage IV (T3, N2, M1c) non-small cell lung cancer favoring adenocarcinoma presented with right upper lobe lung mass in addition to right lower lobe, left lower lobe in addition to right hilar and mediastinal lymphadenopathy in addition to metastatic brain lesions diagnosed in October 2021. ? ?Molecular studies by [employer]: ? ?[id number], 9.5%,  ? ?PRIOR THERAPY: SRS to 3 brain lesion under the care of Dr. Lisbeth Renshaw. ? ?CURRENT THERAPY: Systemic chemotherapy with carboplatin for AUC of 5, Alimta 500 mg/M2 and Keytruda 200 mg IV every 3 weeks.  First dose April 24, 2020.  Status post 24 cycles.  Starting from cycle #5 the patient will be on maintenance treatment with Alimta and Keytruda every 3 weeks. ? ?INTERVAL HISTORY: ?Nicolas Allen 66 y.o. male returns to the clinic today for follow-up visit.  The patient is feeling fine today with no concerning complaints.  He denied having any chest pain, shortness of breath, cough or hemoptysis.  He denied having any fever or chills.  He has no nausea, vomiting, diarrhea except after the oral contrast and no constipation.  He denied having any recent weight loss or night sweats.  He has been tolerating his treatment with maintenance Alimta and Keytruda fairly well.  The patient is here today for evaluation with repeat CT scan of the chest, abdomen and pelvis for restaging of his disease. ? ?MEDICAL HISTORY: ?Past Medical History:  ?Diagnosis Date  ? Anemia 06/24/2012  ? ANXIETY 02/03/2007  ? Cervical disc disease 02/12/2012  ? S/p surgury jan 2013  ? Erectile dysfunction 02/11/2011  ? GERD (gastroesophageal reflux disease)   ? GLUCOSE INTOLERANCE 01/04/2008  ? HYPERLIPIDEMIA 02/03/2007  ? HYPERTENSION 02/03/2007  ? IBS (irritable bowel syndrome)   ? Impaired glucose tolerance 02/11/2011  ? nscl  ca 03/30/2020  ? Smoker 08/22/2014  ? Substance abuse ([facility]) 2001  ? sober for 16 yrs  ? ? ?ALLERGIES:  has No Known Allergies. ? ?MEDICATIONS:  ?Current Outpatient Medications  ?Medication Sig Dispense Refill  ? amLODipine-benazepril (LOTREL) 10-20 MG capsule Take 1 capsule by mouth once daily 90 capsule 1  ? aspirin 325 MG EC tablet Take 1 tablet (325 mg total) by mouth daily. 90 tablet 99  ? b complex vitamins tablet Take 1 tablet by mouth daily.    ? cholecalciferol (VITAMIN D3) 25 MCG (1000 UNIT) tablet Take 1,000 Units by mouth daily.    ? folic acid (FOLVITE) 1 MG tablet Take 1 tablet (1 mg total) by mouth daily. 90 tablet 3  ? Garlic 2536 MG CAPS Take 1,000 mg by mouth daily.     ? lidocaine-prilocaine (EMLA) cream Apply to the Port-A-Cath site 30-60 minutes before chemotherapy. 30 g 0  ? Omega-3 Fatty Acids (FISH OIL PO) Take 1 capsule by mouth daily.     ? polyethylene glycol (MIRALAX / GLYCOLAX) 17 g packet Take 17 g by mouth daily as needed.    ? prochlorperazine (COMPAZINE) 10 MG tablet Take 1 tablet (10 mg total) by mouth every 6 (six) hours as needed for nausea or vomiting. 30 tablet 0  ? rosuvastatin (CRESTOR) 40 MG tablet Take 1 tablet (40 mg total) by mouth daily. 90 tablet 3  ? vardenafil (LEVITRA) 20 MG tablet TAKE 1 TABLET BY MOUTH AS NEEDED FOR  ERECTILE  DYSFUNCTION 10 tablet  5  ? ?No current facility-administered medications for this visit.  ? ? ?SURGICAL HISTORY:  ?Past Surgical History:  ?Procedure Laterality Date  ? BRONCHIAL BIOPSY  03/30/2020  ? Procedure: BRONCHIAL BIOPSIES;  Surgeon: Garner Nash, DO;  Location: [facility];  Service: Pulmonary;;  ? BRONCHIAL BRUSHINGS  03/30/2020  ? Procedure: BRONCHIAL BRUSHINGS;  Surgeon: Garner Nash, DO;  Location: [facility];  Service: Pulmonary;;  ? BRONCHIAL NEEDLE ASPIRATION BIOPSY  03/30/2020  ? Procedure: BRONCHIAL NEEDLE ASPIRATION BIOPSIES;  Surgeon: Garner Nash, DO;  Location: [facility];  Service: Pulmonary;;  ?  BRONCHIAL WASHINGS  03/30/2020  ? Procedure: BRONCHIAL WASHINGS;  Surgeon: Garner Nash, DO;  Location: [facility];  Service: Pulmonary;;  ? COLONOSCOPY  2007  ? IR IMAGING GUIDED PORT INSERTION  04/23/2020  ? neck fusion  2012  ? C4  ? NO PAST SURGERIES    ? VIDEO BRONCHOSCOPY WITH ENDOBRONCHIAL NAVIGATION N/A 03/30/2020  ? Procedure: VIDEO BRONCHOSCOPY WITH ENDOBRONCHIAL NAVIGATION;  Surgeon: Garner Nash, DO;  Location: [facility];  Service: Pulmonary;  Laterality: N/A;  ? VIDEO BRONCHOSCOPY WITH ENDOBRONCHIAL ULTRASOUND N/A 03/30/2020  ? Procedure: VIDEO BRONCHOSCOPY WITH ENDOBRONCHIAL ULTRASOUND;  Surgeon: Garner Nash, DO;  Location: [facility];  Service: Pulmonary;  Laterality: N/A;  ? ? ?REVIEW OF SYSTEMS:  Constitutional: negative ?Eyes: negative ?Ears, nose, mouth, throat, and face: negative ?Respiratory: negative ?Cardiovascular: negative ?Gastrointestinal: negative ?Genitourinary:negative ?Integument/breast: negative ?Hematologic/lymphatic: negative ?Musculoskeletal:negative ?Neurological: negative ?Behavioral/Psych: negative ?Endocrine: negative ?Allergic/Immunologic: negative  ? ?PHYSICAL EXAMINATION: General appearance: alert, cooperative, and no distress ?Head: Normocephalic, without obvious abnormality, atraumatic ?Neck: no adenopathy, no JVD, supple, symmetrical, trachea midline, and thyroid not enlarged, symmetric, no tenderness/mass/nodules ?Lymph nodes: Cervical, supraclavicular, and axillary nodes normal. ?Resp: clear to auscultation bilaterally ?Back: symmetric, no curvature. ROM normal. No CVA tenderness. ?Cardio: regular rate and rhythm, S1, S2 normal, no murmur, click, rub or gallop ?GI: soft, non-tender; bowel sounds normal; no masses,  no organomegaly ?Extremities: extremities normal, atraumatic, no cyanosis or edema ?Neurologic: Alert and oriented X 3, normal strength and tone. Normal symmetric reflexes. Normal coordination and gait ? ?ECOG PERFORMANCE STATUS: 1 -  Symptomatic but completely ambulatory ? ?Blood pressure 113/66, pulse 74, temperature 98.3 ?F (36.8 ?C), temperature source Tympanic, resp. rate 16, weight 175 lb 8 oz (79.6 kg), SpO2 100 %. ? ?LABORATORY DATA: ?Lab Results  ?Component Value Date  ? WBC 5.7 09/10/2021  ? HGB 11.4 (L) 09/10/2021  ? HCT 33.2 (L) 09/10/2021  ? MCV 85.1 09/10/2021  ? PLT 137 (L) 09/10/2021  ? ? ?  Chemistry   ?   ?Component Value Date/Time  ? NA 136 08/26/2021 1405  ? K 4.5 08/26/2021 1405  ? CL 102 08/26/2021 1405  ? CO2 26 08/26/2021 1405  ? BUN 11 08/26/2021 1405  ? CREATININE 1.13 08/26/2021 1405  ? CREATININE 1.18 08/20/2021 1032  ? CREATININE 0.88 02/21/2020 1115  ?    ?Component Value Date/Time  ? CALCIUM 9.2 08/26/2021 1405  ? ALKPHOS 45 08/26/2021 1405  ? AST 44 (H) 08/26/2021 1405  ? AST 24 08/20/2021 1032  ? ALT 37 08/26/2021 1405  ? ALT 20 08/20/2021 1032  ? BILITOT 0.6 08/26/2021 1405  ? BILITOT 0.3 08/20/2021 1032  ?  ? ? ? ?RADIOGRAPHIC STUDIES: ?CT Chest W Contrast ? ?Result Date: 09/09/2021 ?CLINICAL DATA:  Non-small cell lung cancer staging/follow-up. * Tracking Code: BO * EXAM: CT CHEST, ABDOMEN, AND PELVIS WITH CONTRAST TECHNIQUE: Multidetector CT imaging of the  chest, abdomen and pelvis was performed following the standard protocol during bolus administration of intravenous contrast. RADIATION DOSE REDUCTION: This exam was performed according to the departmental dose-optimization program which includes automated exposure control, adjustment of the mA and/or kV according to patient size and/or use of iterative reconstruction technique. CONTRAST:  152mL OMNIPAQUE IOHEXOL 300 MG/ML  SOLN COMPARISON:  Prior studies 07/05/2021, 05/06/2021 and 10/26/2020. FINDINGS: CT CHEST FINDINGS Cardiovascular: No acute vascular findings. Right IJ Port-A-Cath extends to the superior cavoatrial junction. There is atherosclerosis of the aorta, great vessels and coronary arteries. A small amount of pericardial fluid or thickening  anteriorly appears unchanged. The heart size is normal. Mediastinum/Nodes: There are no enlarged mediastinal, hilar or axillary lymph nodes. The thyroid gland, trachea and esophagus demonstrate no significant findings. Orlene Plum

## 2021-09-10 NOTE — Patient Instructions (Signed)
[facility]  Discharge Instructions: ?Thank you for choosing [facility] to provide your oncology and hematology care.  ? ?If you have a lab appointment with the [facility], please go directly to the [facility] and check in at the registration area. ?  ?Wear comfortable clothing and clothing appropriate for easy access to any Portacath or PICC line.  ? ?We strive to give you quality time with your provider. You may need to reschedule your appointment if you arrive late (15 or more minutes).  Arriving late affects you and other patients whose appointments are after yours.  Also, if you miss three or more appointments without notifying the office, you may be dismissed from the clinic at the provider?s discretion.    ?  ?For prescription refill requests, have your pharmacy contact our office and allow 72 hours for refills to be completed.   ? ?Today you received the following chemotherapy and/or immunotherapy agents: Keytruda, Alimta    ?  ?To help prevent nausea and vomiting after your treatment, we encourage you to take your nausea medication as directed. ? ?BELOW ARE SYMPTOMS THAT SHOULD BE REPORTED IMMEDIATELY: ?*FEVER GREATER THAN 100.4 F (38 ?C) OR HIGHER ?*CHILLS OR SWEATING ?*NAUSEA AND VOMITING THAT IS NOT CONTROLLED WITH YOUR NAUSEA MEDICATION ?*UNUSUAL SHORTNESS OF BREATH ?*UNUSUAL BRUISING OR BLEEDING ?*URINARY PROBLEMS (pain or burning when urinating, or frequent urination) ?*BOWEL PROBLEMS (unusual diarrhea, constipation, pain near the anus) ?TENDERNESS IN MOUTH AND THROAT WITH OR WITHOUT PRESENCE OF ULCERS (sore throat, sores in mouth, or a toothache) ?UNUSUAL RASH, SWELLING OR PAIN  ?UNUSUAL VAGINAL DISCHARGE OR ITCHING  ? ?Items with * indicate a potential emergency and should be followed up as soon as possible or go to the Emergency Department if any problems should occur. ? ?Please show the CHEMOTHERAPY ALERT CARD or IMMUNOTHERAPY ALERT CARD at  check-in to the Emergency Department and triage nurse. ? ?Should you have questions after your visit or need to cancel or reschedule your appointment, please contact [facility]  Dept: [phone]  and follow the prompts.  Office hours are 8:00 a.m. to 4:30 p.m. Monday - Friday. Please note that voicemails left after 4:00 p.m. may not be returned until the following business day.  We are closed weekends and major holidays. You have access to a nurse at all times for urgent questions. Please call the main number to the clinic Dept: [phone] and follow the prompts. ? ? ?For any non-urgent questions, you may also contact your provider using MyChart. We now offer e-Visits for anyone 58 and older to request care online for non-urgent symptoms. For details visit [email or web address]. ?  ?Also download the MyChart app! Go to the app store, search "MyChart", open the app, select [facility], and log in with your MyChart username and password. ? ?Due to Covid, a mask is required upon entering the hospital/clinic. If you do not have a mask, one will be given to you upon arrival. For doctor visits, patients may have 1 support person aged 54 or older with them. For treatment visits, patients cannot have anyone with them due to current Covid guidelines and our immunocompromised population.  ? ?

## 2021-09-20 ENCOUNTER — Ambulatory Visit
Admission: RE | Admit: 2021-09-20 | Discharge: 2021-09-20 | Disposition: A | Discharge status: Home / Self Care | Payer: 59 | Source: Ambulatory Visit | Attending: Radiation Oncology | Admitting: Radiation Oncology

## 2021-09-20 ENCOUNTER — Encounter: Payer: Self-pay | Admitting: Radiation Oncology

## 2021-09-20 DIAGNOSIS — C7931 Secondary malignant neoplasm of brain: Secondary | ICD-10-CM

## 2021-09-20 MED ORDER — GADOBENATE DIMEGLUMINE 529 MG/ML IV SOLN
15.0000 mL | Freq: Once | INTRAVENOUS | Status: AC | PRN
  Administered 2021-09-20: 15 mL via INTRAVENOUS

## 2021-09-20 NOTE — Progress Notes (Signed)
Telephone appointment. I verified patient identity, and began nursing interview. Patient is doing well overall. No issues reported at this time. ? ?Meaningful use complete. ? ?Patient reminded of his 3:30pm-09/23/21 telephone appointment w/ Shona Simpson PA-C. I left my extension [phone] in case patient needs anything. Patient verbalized understanding of information. ? ?Patient contact [phone] ?

## 2021-09-23 ENCOUNTER — Ambulatory Visit
Admission: RE | Admit: 2021-09-23 | Discharge: 2021-09-23 | Disposition: A | Discharge status: Home / Self Care | Payer: 59 | Source: Ambulatory Visit | Attending: Radiation Oncology | Admitting: Radiation Oncology

## 2021-09-23 ENCOUNTER — Inpatient Hospital Stay: Payer: 59 | Attending: Internal Medicine

## 2021-09-23 DIAGNOSIS — Z79899 Other long term (current) drug therapy: Secondary | ICD-10-CM | POA: Insufficient documentation

## 2021-09-23 DIAGNOSIS — Z87891 Personal history of nicotine dependence: Secondary | ICD-10-CM | POA: Insufficient documentation

## 2021-09-23 DIAGNOSIS — Z5112 Encounter for antineoplastic immunotherapy: Secondary | ICD-10-CM | POA: Insufficient documentation

## 2021-09-23 DIAGNOSIS — C3411 Malignant neoplasm of upper lobe, right bronchus or lung: Secondary | ICD-10-CM | POA: Insufficient documentation

## 2021-09-23 DIAGNOSIS — C7931 Secondary malignant neoplasm of brain: Secondary | ICD-10-CM | POA: Insufficient documentation

## 2021-09-23 DIAGNOSIS — K118 Other diseases of salivary glands: Secondary | ICD-10-CM

## 2021-09-23 DIAGNOSIS — C3491 Malignant neoplasm of unspecified part of right bronchus or lung: Secondary | ICD-10-CM

## 2021-09-23 NOTE — Progress Notes (Signed)
?Radiation Oncology         [phone] ?________________________________ ? ?Outpatient Follow Up - Conducted via telephone due to current COVID-19 concerns for limiting patient exposure ? ?I spoke with the patient to conduct this consult visit via telephone to spare the patient unnecessary potential exposure in the healthcare setting during the current COVID-19 pandemic. The patient was notified in advance and was offered a [facility] meeting to allow for face to face communication but unfortunately reported that they did not have the appropriate resources/technology to support such a visit and instead preferred to proceed with a telephone visit. ? ? ?Name: Nicolas Allen        MRN: [id number]  ?Date of Service: 09/23/2021 DOB: June 20, 1955 ? ?VQ:MGQQ, Hunt Oris, MD  Curt Bears, MD    ? ?REFERRING PHYSICIAN: Curt Bears, MD ? ? ?DIAGNOSIS: The encounter diagnosis was Non-small cell carcinoma of right lung, stage 4 ([facility]). ? ? ?HISTORY OF PRESENT ILLNESS: Nicolas Allen is a 66 y.o. male with a history of stage IV non-small cell lung cancer, adenocarcinoma involving multifocal disease in the lung and 3 brain metastases. He was diagnosed in the fall of 2021 and found to have metastatic disease in the lung and brain for which he has received stereotactic radiosurgery [facility] [address]) with Dr. Lisbeth Renshaw and has continued on chemo/immunotherapy with Dr. Julien Nordmann.   ? ?He has been followed in surveillance and has been without recurrent disease in the brain.  His most recent MRI of the brain on 09/20/2021 showed minimal increase in the size of the lesion in the vermis measuring 6 x 4 x 5 mm, previously 4 x 3 x 5 mm.  No significant edema is identified and no new disease is present.  This lesion corresponds to the site in the mid cerebellum that was called this PTV 2 and measured 11 mm at the time of his treatment in 2021. Incidentally, he did have changes in the right parotid mass while this is favored to be benign it was recommended  that this be further evaluated. 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  He is contacted by phone to review these recommendations. ? ? ?PREVIOUS RADIATION THERAPY: ? ?04/20/20 SRS Treatment: ?Each site below was treated to 20 Gy in 1 fraction ?PTV1 Rt Cerebellum 45mm ?PTV2Mid cerebellum 65mm ?PTV3Lt Parietal 34mm ? ? ? ?PAST MEDICAL HISTORY:  ?Past Medical History:  ?Diagnosis Date  ? Anemia 06/24/2012  ? ANXIETY 02/03/2007  ? Cervical disc disease 02/12/2012  ? S/p surgury jan 2013  ? Erectile dysfunction 02/11/2011  ? GERD (gastroesophageal reflux disease)   ? GLUCOSE INTOLERANCE 01/04/2008  ? HYPERLIPIDEMIA 02/03/2007  ? HYPERTENSION 02/03/2007  ? IBS (irritable bowel syndrome)   ? Impaired glucose tolerance 02/11/2011  ? nscl ca 03/30/2020  ? Smoker 08/22/2014  ? Substance abuse ([facility]) 2001  ? sober for 16 yrs  ?   ? ? ?PAST SURGICAL HISTORY: ?Past Surgical History:  ?Procedure Laterality Date  ? BRONCHIAL BIOPSY  03/30/2020  ? Procedure: BRONCHIAL BIOPSIES;  Surgeon: Garner Nash, DO;  Location: [facility];  Service: Pulmonary;;  ? BRONCHIAL BRUSHINGS  03/30/2020  ? Procedure: BRONCHIAL BRUSHINGS;  Surgeon: Garner Nash, DO;  Location: [facility];  Service: Pulmonary;;  ? BRONCHIAL NEEDLE ASPIRATION BIOPSY  03/30/2020  ? Procedure: BRONCHIAL NEEDLE ASPIRATION BIOPSIES;  Surgeon: Garner Nash, DO;  Location: [facility];  Service: Pulmonary;;  ? BRONCHIAL  WASHINGS  03/30/2020  ? Procedure: BRONCHIAL WASHINGS;  Surgeon: Garner Nash, DO;  Location: [facility];  Service: Pulmonary;;  ? COLONOSCOPY  2007  ? IR IMAGING GUIDED PORT INSERTION  04/23/2020  ? neck fusion  2012  ? C4  ? NO PAST SURGERIES    ? VIDEO BRONCHOSCOPY WITH ENDOBRONCHIAL NAVIGATION N/A 03/30/2020  ? Procedure: VIDEO BRONCHOSCOPY WITH ENDOBRONCHIAL NAVIGATION;  Surgeon: Garner Nash, DO;  Location: [facility];  Service: Pulmonary;   Laterality: N/A;  ? VIDEO BRONCHOSCOPY WITH ENDOBRONCHIAL ULTRASOUND N/A 03/30/2020  ? Procedure: VIDEO BRONCHOSCOPY WITH ENDOBRONCHIAL ULTRASOUND;  Surgeon: Garner Nash, DO;  Location: [facility];  Service: Pulmonary;  Laterality: N/A;  ? ? ? ?FAMILY HISTORY:  ?Family History  ?Problem Relation Age of Onset  ? Cancer Mother   ?     esophagus  ? Cancer Cousin   ? Stroke Other   ? Hypertension Other   ? Colon cancer Neg Hx   ? ? ? ?SOCIAL HISTORY:  reports that he quit smoking about 19 months ago. His smoking use included cigarettes. He has a 45.00 pack-year smoking history. He has quit using smokeless tobacco.  His smokeless tobacco use included chew. He reports that he does not drink alcohol and does not use drugs. The patient is married and resides in [place]. He enjoys throwing horseshoes and competes in local tournaments between April and October.  ? ? ?ALLERGIES: Patient has no known allergies. ? ? ?MEDICATIONS:  ?Current Outpatient Medications  ?Medication Sig Dispense Refill  ? amLODipine-benazepril (LOTREL) 10-20 MG capsule Take 1 capsule by mouth once daily 90 capsule 1  ? aspirin 325 MG EC tablet Take 1 tablet (325 mg total) by mouth daily. 90 tablet 99  ? b complex vitamins tablet Take 1 tablet by mouth daily.    ? cholecalciferol (VITAMIN D3) 25 MCG (1000 UNIT) tablet Take 1,000 Units by mouth daily.    ? folic acid (FOLVITE) 1 MG tablet Take 1 tablet (1 mg total) by mouth daily. 90 tablet 3  ? Garlic 3382 MG CAPS Take 1,000 mg by mouth daily.     ? lidocaine-prilocaine (EMLA) cream Apply to the Port-A-Cath site 30-60 minutes before chemotherapy. 30 g 0  ? Omega-3 Fatty Acids (FISH OIL PO) Take 1 capsule by mouth daily.     ? polyethylene glycol (MIRALAX / GLYCOLAX) 17 g packet Take 17 g by mouth daily as needed.    ? prochlorperazine (COMPAZINE) 10 MG tablet Take 1 tablet (10 mg total) by mouth every 6 (six) hours as needed for nausea or vomiting. 30 tablet 0  ? rosuvastatin (CRESTOR) 40 MG  tablet Take 1 tablet (40 mg total) by mouth daily. 90 tablet 3  ? vardenafil (LEVITRA) 20 MG tablet TAKE 1 TABLET BY MOUTH AS NEEDED FOR  ERECTILE  DYSFUNCTION 10 tablet 5  ? ?No current facility-administered medications for this encounter.  ? ? ? ?REVIEW OF SYSTEMS: On review of systems, the patient reports he is doing well overall without concern of dizziness, headache, nausea, vomiting, changes in vision gait or stability.  He was able to enjoy Easter weekend with family, and overall feels quite well.  He denies any skin the right side of his face, difficulty with chewing or dryness during chewing.  He is not having any pain in this area either.  No other complaints are verbalized. ? ?PHYSICAL EXAM:  ?Unable to assess given encounter type.  ? ? ?ECOG = 0 ? ?0 -  Asymptomatic (Fully active, able to carry on all predisease activities without restriction) ? ?1 - Symptomatic but completely ambulatory (Restricted in physically strenuous activity but ambulatory and able to carry out work of a light or sedentary nature. For example, light housework, office work) ? ?2 - Symptomatic, <50% in bed during the day (Ambulatory and capable of all self care but unable to carry out any work activities. Up and about more than 50% of waking hours) ? ?3 - Symptomatic, >50% in bed, but not bedbound (Capable of only limited self-care, confined to bed or chair 50% or more of waking hours) ? ?4 - Bedbound (Completely disabled. Cannot carry on any self-care. Totally confined to bed or chair) ? ?5 - Death ? ? Oken MM, Creech RH, Tormey DC, et al. [phone]). "Toxicity and response criteria of the [facility]". [place] Oncol. 5 (6): 649-55 ? ? ? ?LABORATORY DATA:  ?Lab Results  ?Component Value Date  ? WBC 5.7 09/10/2021  ? HGB 11.4 (L) 09/10/2021  ? HCT 33.2 (L) 09/10/2021  ? MCV 85.1 09/10/2021  ? PLT 137 (L) 09/10/2021  ? ?Lab Results  ?Component Value Date  ? NA 137 09/10/2021  ? K 3.9 09/10/2021  ? CL 105  09/10/2021  ? CO2 26 09/10/2021  ? ?Lab Results  ?Component Value Date  ? ALT 21 09/10/2021  ? AST 26 09/10/2021  ? ALKPHOS 44 09/10/2021  ? BILITOT 0.4 09/10/2021  ? ?  ? ?RADIOGRAPHY: CT Chest W Contrast ?

## 2021-09-24 ENCOUNTER — Telehealth: Payer: Self-pay | Admitting: *Deleted

## 2021-09-24 NOTE — Progress Notes (Signed)
[facility] ?OFFICE PROGRESS NOTE ? ?[name], MD ?[place] [address] ? ?DIAGNOSIS: Stage IV (T3, N2, M1c) non-small cell lung cancer favoring adenocarcinoma presented with right upper lobe lung mass in addition to right lower lobe, left lower lobe in addition to right hilar and mediastinal lymphadenopathy in addition to metastatic brain lesions diagnosed in October 2021. ?  ?Molecular studies by Guardant 360: ?  ?STK11D52fs, 9.5%,  ? ?PRIOR THERAPY: PRIOR THERAPY:  SRS to 3 brain lesion under the care of Dr. Lisbeth Renshaw.  Completed on April 20, 2020 ? ?CURRENT THERAPY: Systemic chemotherapy with carboplatin for AUC of 5, Alimta 500 mg/M2 and Keytruda 200 mg IV every 3 weeks.  First dose April 24, 2020.  Status post 22 cycles.  Starting from cycle #5 the patient will be on maintenance treatment with Alimta and Keytruda every 3 weeks. 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 This was resumed starting from cycle #24.  ? ?INTERVAL HISTORY: ?Nicolas Allen 66 y.o. male returns to the clinic today for a follow-up visit.  The patient is feeling fairly well today without any concerning complaints.  In the interval since last being seen, the patient was throwing horseshoes and a few days later developed right upper extremity swelling.  Of note, the patient has a Port-A-Cath in the right chest.  He underwent an ultrasound of the right upper extremity to rule out DVT which was negative.  His swelling is improving at this time.  He denies any pain in the upper extremities.  He is currently undergoing treatment with alimta and [place]. 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 This was resumed from cycle #24 and he tolerated his last cycle well without any concerning adverse side effects. He denies any fever, chills, night sweats, or unexplained weight loss.  Denies any nausea or vomiting.  Denies  any abnormal diarrhea or constipation. Denies any chest pain, shortness of breath, or hemoptysis.  Denies any significant cough.  Denies any headache or visual changes. He recently had a repeat brain MRI and follow up with radiation oncology for his history of metastatic disease to the brain. The patient is here today for evaluation and repeat blood work before starting cycle #26. ?  ?  ? ?MEDICAL HISTORY: ?Past Medical History:  ?Diagnosis Date  ? Anemia 06/24/2012  ? ANXIETY 02/03/2007  ? Cervical disc disease 02/12/2012  ? S/p surgury jan 2013  ? Erectile dysfunction 02/11/2011  ? GERD (gastroesophageal reflux disease)   ? GLUCOSE INTOLERANCE 01/04/2008  ? HYPERLIPIDEMIA 02/03/2007  ? HYPERTENSION 02/03/2007  ? IBS (irritable bowel syndrome)   ? Impaired glucose tolerance 02/11/2011  ? nscl ca 03/30/2020  ? Smoker 08/22/2014  ? Substance abuse ([place]) 2001  ? sober for 16 yrs  ? ? ?ALLERGIES:  has No Known Allergies. ? ?MEDICATIONS:  ?Current Outpatient Medications  ?Medication Sig Dispense Refill  ? amLODipine-benazepril (LOTREL) 10-20 MG capsule Take 1 capsule by mouth once daily 90 capsule 1  ? aspirin 325 MG EC tablet Take 1 tablet (325 mg total) by mouth daily. 90 tablet 99  ? b complex vitamins tablet Take 1 tablet by mouth daily.    ? cholecalciferol (VITAMIN D3) 25 MCG (1000 UNIT) tablet Take 1,000 Units by mouth daily.    ? folic acid (FOLVITE) 1 MG tablet Take 1 tablet (1 mg total) by mouth daily. 90 tablet  3  ? Garlic 0973 MG CAPS Take 1,000 mg by mouth daily.     ? Omega-3 Fatty Acids (FISH OIL PO) Take 1 capsule by mouth daily.     ? polyethylene glycol (MIRALAX / GLYCOLAX) 17 g packet Take 17 g by mouth daily as needed.    ? prochlorperazine (COMPAZINE) 10 MG tablet Take 1 tablet (10 mg total) by mouth every 6 (six) hours as needed for nausea or vomiting. 30 tablet 0  ? rosuvastatin (CRESTOR) 40 MG tablet Take 1 tablet (40 mg total) by mouth daily. 90 tablet 3  ? vardenafil (LEVITRA) 20 MG tablet TAKE  1 TABLET BY MOUTH AS NEEDED FOR  ERECTILE  DYSFUNCTION 10 tablet 5  ? lidocaine-prilocaine (EMLA) cream Apply to the Port-A-Cath site 30-60 minutes before chemotherapy. (Patient not taking: Reported on 10/01/2021) 30 g 0  ? ?No current facility-administered medications for this visit.  ? ? ?SURGICAL HISTORY:  ?Past Surgical History:  ?Procedure Laterality Date  ? BRONCHIAL BIOPSY  03/30/2020  ? Procedure: BRONCHIAL BIOPSIES;  Surgeon: Garner Nash, DO;  Location: [facility];  Service: Pulmonary;;  ? BRONCHIAL BRUSHINGS  03/30/2020  ? Procedure: BRONCHIAL BRUSHINGS;  Surgeon: Garner Nash, DO;  Location: [facility];  Service: Pulmonary;;  ? BRONCHIAL NEEDLE ASPIRATION BIOPSY  03/30/2020  ? Procedure: BRONCHIAL NEEDLE ASPIRATION BIOPSIES;  Surgeon: Garner Nash, DO;  Location: [facility];  Service: Pulmonary;;  ? BRONCHIAL WASHINGS  03/30/2020  ? Procedure: BRONCHIAL WASHINGS;  Surgeon: Garner Nash, DO;  Location: [facility];  Service: Pulmonary;;  ? COLONOSCOPY  2007  ? IR IMAGING GUIDED PORT INSERTION  04/23/2020  ? neck fusion  2012  ? C4  ? NO PAST SURGERIES    ? VIDEO BRONCHOSCOPY WITH ENDOBRONCHIAL NAVIGATION N/A 03/30/2020  ? Procedure: VIDEO BRONCHOSCOPY WITH ENDOBRONCHIAL NAVIGATION;  Surgeon: Garner Nash, DO;  Location: [facility];  Service: Pulmonary;  Laterality: N/A;  ? VIDEO BRONCHOSCOPY WITH ENDOBRONCHIAL ULTRASOUND N/A 03/30/2020  ? Procedure: VIDEO BRONCHOSCOPY WITH ENDOBRONCHIAL ULTRASOUND;  Surgeon: Garner Nash, DO;  Location: [facility];  Service: Pulmonary;  Laterality: N/A;  ? ? ?REVIEW OF SYSTEMS:   ?Review of Systems  ?Constitutional: Negative for appetite change, chills, fatigue, fever and unexpected weight change.  ?HENT:  Negative for mouth sores, nosebleeds, sore throat and trouble swallowing.   ?Eyes: Negative for eye problems and icterus.  ?Respiratory: Negative for cough, hemoptysis, shortness of breath and wheezing.   ?Cardiovascular: Negative for  chest pain and leg swelling.  ?Gastrointestinal: Negative for abdominal pain, constipation, diarrhea, nausea and vomiting.  ?Genitourinary: Negative for bladder incontinence, difficulty urinating, dysuria, frequency and hematuria.   ?Musculoskeletal: Negative for back pain, gait problem, neck pain and neck stiffness.  Positive for mild/minimal right upper extremity swelling.  ?Skin: Negative for itching and rash.  ?Neurological: Negative for dizziness, extremity weakness, gait problem, headaches, light-headedness and seizures.  ?Hematological: Negative for adenopathy. Does not bruise/bleed easily.  ?Psychiatric/Behavioral: Negative for confusion, depression and sleep disturbance. The patient is not nervous/anxious.   ? ? ?PHYSICAL EXAMINATION:  ?Blood pressure 115/63, pulse 68, temperature (!) 97.4 ?F (36.3 ?C), temperature source Tympanic, resp. rate 16, weight 174 lb 11.2 oz (79.2 kg), SpO2 100 %. ? ?ECOG PERFORMANCE STATUS: 1 ? ?Physical Exam  ?Constitutional: Oriented to person, place, and time and well-developed, well-nourished, and in no distress. ?HENT:  ?Head: Normocephalic and atraumatic.  ?Mouth/Throat: Oropharynx is clear and moist. No oropharyngeal exudate.  ?Eyes: Conjunctivae are normal. Right eye  exhibits no discharge. Left eye exhibits no discharge. No scleral icterus.  ?Neck: Normal range of motion. Neck supple.  ?Cardiovascular: Normal rate, regular rhythm, normal heart sounds and intact distal pulses.   ?Pulmonary/Chest: Effort normal and breath sounds normal. No respiratory distress. No wheezes. No rales.  ?Abdominal: Soft. Bowel sounds are normal. Exhibits no distension and no mass. There is no tenderness.  ?Musculoskeletal: Normal range of motion. Exhibits no edema.  Positive for mild swelling in the right hand (reportedly improved compared to prior) ?Lymphadenopathy:  ?  No cervical adenopathy.  ?Neurological: Alert and oriented to person, place, and time. Exhibits normal muscle tone. Gait  normal. Coordination normal.  ?Skin: Skin is warm and dry. No rash noted. Not diaphoretic. No erythema. No pallor.  ?Psychiatric: Mood, memory and judgment normal.  ?Vitals reviewed. ? ?LABORATORY DATA:

## 2021-09-24 NOTE — Telephone Encounter (Signed)
Called patient to inform of appt. with Dr. Fredric Dine on Monday April 17- arrival time- 2:15 pm- address- [address], [address], ph. No. [phone], patient to bring insurance card and photo id, spoke with patient and he is aware of this appt. ?

## 2021-09-25 ENCOUNTER — Other Ambulatory Visit: Payer: Self-pay | Admitting: Physician Assistant

## 2021-09-25 ENCOUNTER — Other Ambulatory Visit: Payer: Self-pay

## 2021-09-25 ENCOUNTER — Telehealth: Payer: Self-pay

## 2021-09-25 DIAGNOSIS — M7989 Other specified soft tissue disorders: Secondary | ICD-10-CM

## 2021-09-25 NOTE — Telephone Encounter (Signed)
Pts wife LM stating the pt has swelling in his right arm. ? ?Cassandra, PA-C has entered an order for and [name] to r/o DVT. ? ?Pt has been scheduled for tomorrow 09/26/21 at 2pm. I have spoke with pts wife, Beverlee Nims and advised of this. She expressed understanding of this information. ?

## 2021-09-26 ENCOUNTER — Ambulatory Visit (HOSPITAL_COMMUNITY)
Admission: RE | Admit: 2021-09-26 | Discharge: 2021-09-26 | Disposition: A | Discharge status: Home / Self Care | Payer: 59 | Source: Ambulatory Visit | Attending: Physician Assistant | Admitting: Physician Assistant

## 2021-09-26 ENCOUNTER — Other Ambulatory Visit: Payer: Self-pay | Admitting: Physician Assistant

## 2021-09-26 DIAGNOSIS — M79601 Pain in right arm: Secondary | ICD-10-CM

## 2021-09-26 DIAGNOSIS — M7989 Other specified soft tissue disorders: Secondary | ICD-10-CM | POA: Diagnosis not present

## 2021-09-26 NOTE — Telephone Encounter (Signed)
Pt has been advised he is negative for DVT. Pt states his arm is "slightly swollen" and thinks it is from "pitching horse shoes" because he hasn't done it in a long time. He states he is happy to learn he does not have a blood clot though. ?

## 2021-10-01 ENCOUNTER — Inpatient Hospital Stay: Payer: 59

## 2021-10-01 ENCOUNTER — Other Ambulatory Visit: Payer: Self-pay

## 2021-10-01 ENCOUNTER — Inpatient Hospital Stay: Payer: 59 | Admitting: Physician Assistant

## 2021-10-01 ENCOUNTER — Encounter: Payer: Self-pay | Admitting: Physician Assistant

## 2021-10-01 VITALS — BP 115/63 | HR 68 | Temp 97.4°F | Resp 16 | Wt 174.7 lb

## 2021-10-01 DIAGNOSIS — C3491 Malignant neoplasm of unspecified part of right bronchus or lung: Secondary | ICD-10-CM

## 2021-10-01 DIAGNOSIS — Z5112 Encounter for antineoplastic immunotherapy: Secondary | ICD-10-CM | POA: Diagnosis not present

## 2021-10-01 DIAGNOSIS — Z95828 Presence of other vascular implants and grafts: Secondary | ICD-10-CM

## 2021-10-01 DIAGNOSIS — C3411 Malignant neoplasm of upper lobe, right bronchus or lung: Secondary | ICD-10-CM | POA: Diagnosis present

## 2021-10-01 DIAGNOSIS — Z87891 Personal history of nicotine dependence: Secondary | ICD-10-CM | POA: Diagnosis not present

## 2021-10-01 DIAGNOSIS — C7931 Secondary malignant neoplasm of brain: Secondary | ICD-10-CM | POA: Diagnosis present

## 2021-10-01 DIAGNOSIS — Z5111 Encounter for antineoplastic chemotherapy: Secondary | ICD-10-CM

## 2021-10-01 DIAGNOSIS — Z79899 Other long term (current) drug therapy: Secondary | ICD-10-CM | POA: Diagnosis not present

## 2021-10-01 LAB — CMP (CANCER CENTER ONLY)
ALT: 22 U/L (ref 0–44)
AST: 27 U/L (ref 15–41)
Albumin: 3.5 g/dL (ref 3.5–5.0)
Alkaline Phosphatase: 42 U/L (ref 38–126)
Anion gap: 4 — ABNORMAL LOW (ref 5–15)
BUN: 13 mg/dL (ref 8–23)
CO2: 28 mmol/L (ref 22–32)
Calcium: 8.9 mg/dL (ref 8.9–10.3)
Chloride: 106 mmol/L (ref 98–111)
Creatinine: 1.17 mg/dL (ref 0.61–1.24)
GFR, Estimated: >60 mL/min (ref >60)
Glucose, Bld: 162 mg/dL — ABNORMAL HIGH (ref 70–99)
Potassium: 3.8 mmol/L (ref 3.5–5.1)
Sodium: 138 mmol/L (ref 135–145)
Total Bilirubin: 0.3 mg/dL (ref 0.3–1.2)
Total Protein: 6.7 g/dL (ref 6.5–8.1)

## 2021-10-01 LAB — CBC WITH DIFFERENTIAL (CANCER CENTER ONLY)
Abs Immature Granulocytes: 0.02 10*3/uL (ref 0.00–0.07)
Basophils Absolute: 0 10*3/uL (ref 0.0–0.1)
Basophils Relative: 0 %
Eosinophils Absolute: 0.1 10*3/uL (ref 0.0–0.5)
Eosinophils Relative: 3 %
HCT: 32 % — ABNORMAL LOW (ref 39.0–52.0)
Hemoglobin: 11 g/dL — ABNORMAL LOW (ref 13.0–17.0)
Immature Granulocytes: 0 %
Lymphocytes Relative: 39 %
Lymphs Abs: 1.8 10*3/uL (ref 0.7–4.0)
MCH: 29.3 pg (ref 26.0–34.0)
MCHC: 34.4 g/dL (ref 30.0–36.0)
MCV: 85.3 fL (ref 80.0–100.0)
Monocytes Absolute: 0.5 10*3/uL (ref 0.1–1.0)
Monocytes Relative: 12 %
Neutro Abs: 2.1 10*3/uL (ref 1.7–7.7)
Neutrophils Relative %: 46 %
Platelet Count: 154 10*3/uL (ref 150–400)
RBC: 3.75 MIL/uL — ABNORMAL LOW (ref 4.22–5.81)
RDW: 16.5 % — ABNORMAL HIGH (ref 11.5–15.5)
WBC Count: 4.5 10*3/uL (ref 4.0–10.5)
nRBC: 0 % (ref 0.0–0.2)

## 2021-10-01 LAB — TSH: TSH: 1.597 u[IU]/mL (ref 0.350–4.500)

## 2021-10-01 MED ORDER — SODIUM CHLORIDE 0.9 % IV SOLN
200.0000 mg | Freq: Once | INTRAVENOUS | Status: AC
  Administered 2021-10-01: 200 mg via INTRAVENOUS
  Filled 2021-10-01: qty 200

## 2021-10-01 MED ORDER — SODIUM CHLORIDE 0.9 % IV SOLN
500.0000 mg/m2 | Freq: Once | INTRAVENOUS | Status: AC
  Administered 2021-10-01: 1000 mg via INTRAVENOUS
  Filled 2021-10-01: qty 40

## 2021-10-01 MED ORDER — SODIUM CHLORIDE 0.9% FLUSH
10.0000 mL | INTRAVENOUS | Status: DC | PRN
  Administered 2021-10-01: 10 mL

## 2021-10-01 MED ORDER — PROCHLORPERAZINE MALEATE 10 MG PO TABS
10.0000 mg | ORAL_TABLET | Freq: Once | ORAL | Status: AC
  Administered 2021-10-01: 10 mg via ORAL
  Filled 2021-10-01: qty 1

## 2021-10-01 MED ORDER — HEPARIN SOD (PORK) LOCK FLUSH 100 UNIT/ML IV SOLN
500.0000 [IU] | Freq: Once | INTRAVENOUS | Status: AC | PRN
  Administered 2021-10-01: 500 [IU]

## 2021-10-01 MED ORDER — SODIUM CHLORIDE 0.9% FLUSH
10.0000 mL | Freq: Once | INTRAVENOUS | Status: AC
  Administered 2021-10-01: 10 mL

## 2021-10-01 MED ORDER — SODIUM CHLORIDE 0.9 % IV SOLN: Freq: Once | INTRAVENOUS | Status: AC

## 2021-10-01 NOTE — Patient Instructions (Signed)
[facility]  Discharge Instructions: Thank you for choosing [facility] to provide your oncology and hematology care.   If you have a lab appointment with the Cancer Center, please go directly to the Cancer Center and check in at the registration area.   Wear comfortable clothing and clothing appropriate for easy access to any Portacath or PICC line.   We strive to give you quality time with your provider. You may need to reschedule your appointment if you arrive late (15 or more minutes).  Arriving late affects you and other patients whose appointments are after yours.  Also, if you miss three or more appointments without notifying the office, you may be dismissed from the clinic at the provider's discretion.      For prescription refill requests, have your pharmacy contact our office and allow 72 hours for refills to be completed.    Today you received the following chemotherapy and/or immunotherapy agents Pembrolizumab (Keytruda) and Pemetrexed (Alimta)      To help prevent nausea and vomiting after your treatment, we encourage you to take your nausea medication as directed.  BELOW ARE SYMPTOMS THAT SHOULD BE REPORTED IMMEDIATELY: *FEVER GREATER THAN 100.4 F (38 C) OR HIGHER *CHILLS OR SWEATING *NAUSEA AND VOMITING THAT IS NOT CONTROLLED WITH YOUR NAUSEA MEDICATION *UNUSUAL SHORTNESS OF BREATH *UNUSUAL BRUISING OR BLEEDING *URINARY PROBLEMS (pain or burning when urinating, or frequent urination) *BOWEL PROBLEMS (unusual diarrhea, constipation, pain near the anus) TENDERNESS IN MOUTH AND THROAT WITH OR WITHOUT PRESENCE OF ULCERS (sore throat, sores in mouth, or a toothache) UNUSUAL RASH, SWELLING OR PAIN  UNUSUAL VAGINAL DISCHARGE OR ITCHING   Items with * indicate a potential emergency and should be followed up as soon as possible or go to the Emergency Department if any problems should occur.  Please show the CHEMOTHERAPY ALERT CARD or  IMMUNOTHERAPY ALERT CARD at check-in to the Emergency Department and triage nurse.  Should you have questions after your visit or need to cancel or reschedule your appointment, please contact [facility]  Dept: [phone]  and follow the prompts.  Office hours are 8:00 a.m. to 4:30 p.m. Monday - Friday. Please note that voicemails left after 4:00 p.m. may not be returned until the following business day.  We are closed weekends and major holidays. You have access to a nurse at all times for urgent questions. Please call the main number to the clinic Dept: [phone] and follow the prompts.   For any non-urgent questions, you may also contact your provider using MyChart. We now offer e-Visits for anyone 18 and older to request care online for non-urgent symptoms. For details visit [email or web address].   Also download the MyChart app! Go to the app store, search "MyChart", open the app, select [facility], and log in with your MyChart username and password.  Due to Covid, a mask is required upon entering the hospital/clinic. If you do not have a mask, one will be given to you upon arrival. For doctor visits, patients may have 1 support person aged 18 or older with them. For treatment visits, patients cannot have anyone with them due to current Covid guidelines and our immunocompromised population.   

## 2021-10-22 ENCOUNTER — Encounter: Payer: Self-pay | Admitting: Internal Medicine

## 2021-10-22 ENCOUNTER — Other Ambulatory Visit: Payer: Self-pay

## 2021-10-22 ENCOUNTER — Inpatient Hospital Stay: Payer: 59

## 2021-10-22 ENCOUNTER — Inpatient Hospital Stay: Payer: 59 | Attending: Internal Medicine | Admitting: Internal Medicine

## 2021-10-22 VITALS — BP 109/55 | HR 75 | Temp 97.8°F | Resp 16 | Wt 175.0 lb

## 2021-10-22 DIAGNOSIS — C3491 Malignant neoplasm of unspecified part of right bronchus or lung: Secondary | ICD-10-CM

## 2021-10-22 DIAGNOSIS — Z5111 Encounter for antineoplastic chemotherapy: Secondary | ICD-10-CM | POA: Diagnosis not present

## 2021-10-22 DIAGNOSIS — C3411 Malignant neoplasm of upper lobe, right bronchus or lung: Secondary | ICD-10-CM | POA: Insufficient documentation

## 2021-10-22 DIAGNOSIS — Z5112 Encounter for antineoplastic immunotherapy: Secondary | ICD-10-CM | POA: Insufficient documentation

## 2021-10-22 DIAGNOSIS — C7931 Secondary malignant neoplasm of brain: Secondary | ICD-10-CM | POA: Insufficient documentation

## 2021-10-22 DIAGNOSIS — Z79899 Other long term (current) drug therapy: Secondary | ICD-10-CM | POA: Diagnosis not present

## 2021-10-22 DIAGNOSIS — Z95828 Presence of other vascular implants and grafts: Secondary | ICD-10-CM

## 2021-10-22 DIAGNOSIS — Z87891 Personal history of nicotine dependence: Secondary | ICD-10-CM | POA: Insufficient documentation

## 2021-10-22 LAB — CBC WITH DIFFERENTIAL (CANCER CENTER ONLY)
Abs Immature Granulocytes: 0.01 10*3/uL (ref 0.00–0.07)
Basophils Absolute: 0 10*3/uL (ref 0.0–0.1)
Basophils Relative: 0 %
Eosinophils Absolute: 0.2 10*3/uL (ref 0.0–0.5)
Eosinophils Relative: 3 %
HCT: 31.9 % — ABNORMAL LOW (ref 39.0–52.0)
Hemoglobin: 11 g/dL — ABNORMAL LOW (ref 13.0–17.0)
Immature Granulocytes: 0 %
Lymphocytes Relative: 32 %
Lymphs Abs: 1.7 10*3/uL (ref 0.7–4.0)
MCH: 29.1 pg (ref 26.0–34.0)
MCHC: 34.5 g/dL (ref 30.0–36.0)
MCV: 84.4 fL (ref 80.0–100.0)
Monocytes Absolute: 0.8 10*3/uL (ref 0.1–1.0)
Monocytes Relative: 15 %
Neutro Abs: 2.6 10*3/uL (ref 1.7–7.7)
Neutrophils Relative %: 50 %
Platelet Count: 160 10*3/uL (ref 150–400)
RBC: 3.78 MIL/uL — ABNORMAL LOW (ref 4.22–5.81)
RDW: 16.1 % — ABNORMAL HIGH (ref 11.5–15.5)
WBC Count: 5.2 10*3/uL (ref 4.0–10.5)
nRBC: 0 % (ref 0.0–0.2)

## 2021-10-22 LAB — CMP (CANCER CENTER ONLY)
ALT: 24 U/L (ref 0–44)
AST: 30 U/L (ref 15–41)
Albumin: 3.4 g/dL — ABNORMAL LOW (ref 3.5–5.0)
Alkaline Phosphatase: 43 U/L (ref 38–126)
Anion gap: 7 (ref 5–15)
BUN: 17 mg/dL (ref 8–23)
CO2: 24 mmol/L (ref 22–32)
Calcium: 9 mg/dL (ref 8.9–10.3)
Chloride: 108 mmol/L (ref 98–111)
Creatinine: 1.32 mg/dL — ABNORMAL HIGH (ref 0.61–1.24)
GFR, Estimated: >60 mL/min (ref >60)
Glucose, Bld: 92 mg/dL (ref 70–99)
Potassium: 4 mmol/L (ref 3.5–5.1)
Sodium: 139 mmol/L (ref 135–145)
Total Bilirubin: 0.6 mg/dL (ref 0.3–1.2)
Total Protein: 6.9 g/dL (ref 6.5–8.1)

## 2021-10-22 LAB — TSH: TSH: 1.55 u[IU]/mL (ref 0.350–4.500)

## 2021-10-22 MED ORDER — CYANOCOBALAMIN 1000 MCG/ML IJ SOLN
1000.0000 ug | Freq: Once | INTRAMUSCULAR | Status: AC
  Administered 2021-10-22: 1000 ug via INTRAMUSCULAR
  Filled 2021-10-22: qty 1

## 2021-10-22 MED ORDER — SODIUM CHLORIDE 0.9 % IV SOLN: Freq: Once | INTRAVENOUS | Status: AC

## 2021-10-22 MED ORDER — PROCHLORPERAZINE MALEATE 10 MG PO TABS
10.0000 mg | ORAL_TABLET | Freq: Once | ORAL | Status: AC
  Administered 2021-10-22: 10 mg via ORAL
  Filled 2021-10-22: qty 1

## 2021-10-22 MED ORDER — SODIUM CHLORIDE 0.9 % IV SOLN
500.0000 mg/m2 | Freq: Once | INTRAVENOUS | Status: AC
  Administered 2021-10-22: 1000 mg via INTRAVENOUS
  Filled 2021-10-22: qty 40

## 2021-10-22 MED ORDER — SODIUM CHLORIDE 0.9% FLUSH
10.0000 mL | Freq: Once | INTRAVENOUS | Status: AC
  Administered 2021-10-22: 10 mL

## 2021-10-22 MED ORDER — SODIUM CHLORIDE 0.9 % IV SOLN
200.0000 mg | Freq: Once | INTRAVENOUS | Status: AC
  Administered 2021-10-22: 200 mg via INTRAVENOUS
  Filled 2021-10-22: qty 200

## 2021-10-22 MED ORDER — HEPARIN SOD (PORK) LOCK FLUSH 100 UNIT/ML IV SOLN
500.0000 [IU] | Freq: Once | INTRAVENOUS | Status: AC | PRN
  Administered 2021-10-22: 500 [IU]

## 2021-10-22 MED ORDER — SODIUM CHLORIDE 0.9% FLUSH
10.0000 mL | INTRAVENOUS | Status: DC | PRN
  Administered 2021-10-22: 10 mL

## 2021-10-22 NOTE — Progress Notes (Signed)
?    [facility] ?[phone]   [phone] ? ?OFFICE PROGRESS NOTE ? ?[name], MD ?[place] [address] ? ?DIAGNOSIS: Stage IV (T3, N2, M1c) non-small cell lung cancer favoring adenocarcinoma presented with right upper lobe lung mass in addition to right lower lobe, left lower lobe in addition to right hilar and mediastinal lymphadenopathy in addition to metastatic brain lesions diagnosed in October 2021. ? ?Molecular studies by Guardant 360: ? ?STK11D110fs, 9.5%,  ? ?PRIOR THERAPY: SRS to 3 brain lesion under the care of Dr. Lisbeth Renshaw. ? ?CURRENT THERAPY: Systemic chemotherapy with carboplatin for AUC of 5, Alimta 500 mg/M2 and Keytruda 200 mg IV every 3 weeks.  First dose April 24, 2020.  Status post 26 cycles.  Starting from cycle #5 the patient will be on maintenance treatment with Alimta and Keytruda every 3 weeks. ? ?INTERVAL HISTORY: ?Nicolas Allen 66 y.o. male returns to the clinic today for follow-up visit.  The patient is feeling fine today with no concerning complaints.  He denied having any current chest pain, shortness of breath, cough or hemoptysis.  He has no nausea, vomiting, diarrhea or constipation.  He denied having any headache or visual changes.  He has no recent weight loss or night sweats.  He has been tolerating his treatment with maintenance Alimta and Keytruda fairly well.  The patient is here today for evaluation before starting cycle #27 of his treatment. ? ?MEDICAL HISTORY: ?Past Medical History:  ?Diagnosis Date  ? Anemia 06/24/2012  ? ANXIETY 02/03/2007  ? Cervical disc disease 02/12/2012  ? S/p surgury jan 2013  ? Erectile dysfunction 02/11/2011  ? GERD (gastroesophageal reflux disease)   ? GLUCOSE INTOLERANCE 01/04/2008  ? HYPERLIPIDEMIA 02/03/2007  ? HYPERTENSION 02/03/2007  ? IBS (irritable bowel syndrome)   ? Impaired glucose tolerance 02/11/2011  ? nscl ca 03/30/2020  ? Smoker 08/22/2014  ? Substance abuse ([place]) 2001   ? sober for 16 yrs  ? ? ?ALLERGIES:  has No Known Allergies. ? ?MEDICATIONS:  ?Current Outpatient Medications  ?Medication Sig Dispense Refill  ? amLODipine-benazepril (LOTREL) 10-20 MG capsule Take 1 capsule by mouth once daily 90 capsule 1  ? aspirin 325 MG EC tablet Take 1 tablet (325 mg total) by mouth daily. 90 tablet 99  ? b complex vitamins tablet Take 1 tablet by mouth daily.    ? cholecalciferol (VITAMIN D3) 25 MCG (1000 UNIT) tablet Take 1,000 Units by mouth daily.    ? folic acid (FOLVITE) 1 MG tablet Take 1 tablet (1 mg total) by mouth daily. 90 tablet 3  ? Garlic 5573 MG CAPS Take 1,000 mg by mouth daily.     ? lidocaine-prilocaine (EMLA) cream Apply to the Port-A-Cath site 30-60 minutes before chemotherapy. (Patient not taking: Reported on 10/01/2021) 30 g 0  ? Omega-3 Fatty Acids (FISH OIL PO) Take 1 capsule by mouth daily.     ? polyethylene glycol (MIRALAX / GLYCOLAX) 17 g packet Take 17 g by mouth daily as needed.    ? prochlorperazine (COMPAZINE) 10 MG tablet Take 1 tablet (10 mg total) by mouth every 6 (six) hours as needed for nausea or vomiting. 30 tablet 0  ? rosuvastatin (CRESTOR) 40 MG tablet Take 1 tablet (40 mg total) by mouth daily. 90 tablet 3  ? vardenafil (LEVITRA) 20 MG tablet TAKE 1 TABLET BY MOUTH AS NEEDED FOR  ERECTILE  DYSFUNCTION 10 tablet 5  ? ?No current facility-administered medications  for this visit.  ? ? ?SURGICAL HISTORY:  ?Past Surgical History:  ?Procedure Laterality Date  ? BRONCHIAL BIOPSY  03/30/2020  ? Procedure: BRONCHIAL BIOPSIES;  Surgeon: Garner Nash, DO;  Location: [facility];  Service: Pulmonary;;  ? BRONCHIAL BRUSHINGS  03/30/2020  ? Procedure: BRONCHIAL BRUSHINGS;  Surgeon: Garner Nash, DO;  Location: [facility];  Service: Pulmonary;;  ? BRONCHIAL NEEDLE ASPIRATION BIOPSY  03/30/2020  ? Procedure: BRONCHIAL NEEDLE ASPIRATION BIOPSIES;  Surgeon: Garner Nash, DO;  Location: [facility];  Service: Pulmonary;;  ? BRONCHIAL WASHINGS   03/30/2020  ? Procedure: BRONCHIAL WASHINGS;  Surgeon: Garner Nash, DO;  Location: [facility];  Service: Pulmonary;;  ? COLONOSCOPY  2007  ? IR IMAGING GUIDED PORT INSERTION  04/23/2020  ? neck fusion  2012  ? C4  ? NO PAST SURGERIES    ? VIDEO BRONCHOSCOPY WITH ENDOBRONCHIAL NAVIGATION N/A 03/30/2020  ? Procedure: VIDEO BRONCHOSCOPY WITH ENDOBRONCHIAL NAVIGATION;  Surgeon: Garner Nash, DO;  Location: [facility];  Service: Pulmonary;  Laterality: N/A;  ? VIDEO BRONCHOSCOPY WITH ENDOBRONCHIAL ULTRASOUND N/A 03/30/2020  ? Procedure: VIDEO BRONCHOSCOPY WITH ENDOBRONCHIAL ULTRASOUND;  Surgeon: Garner Nash, DO;  Location: [facility];  Service: Pulmonary;  Laterality: N/A;  ? ? ?REVIEW OF SYSTEMS:  A comprehensive review of systems was negative.  ? ?PHYSICAL EXAMINATION: General appearance: alert, cooperative, and no distress ?Head: Normocephalic, without obvious abnormality, atraumatic ?Neck: no adenopathy, no JVD, supple, symmetrical, trachea midline, and thyroid not enlarged, symmetric, no tenderness/mass/nodules ?Lymph nodes: Cervical, supraclavicular, and axillary nodes normal. ?Resp: clear to auscultation bilaterally ?Back: symmetric, no curvature. ROM normal. No CVA tenderness. ?Cardio: regular rate and rhythm, S1, S2 normal, no murmur, click, rub or gallop ?GI: soft, non-tender; bowel sounds normal; no masses,  no organomegaly ?Extremities: extremities normal, atraumatic, no cyanosis or edema ? ?ECOG PERFORMANCE STATUS: 1 - Symptomatic but completely ambulatory ? ?Blood pressure (!) 109/55, pulse 75, temperature 97.8 ?F (36.6 ?C), temperature source Oral, resp. rate 16, weight 175 lb (79.4 kg), SpO2 99 %. ? ?LABORATORY DATA: ?Lab Results  ?Component Value Date  ? WBC 5.2 10/22/2021  ? HGB 11.0 (L) 10/22/2021  ? HCT 31.9 (L) 10/22/2021  ? MCV 84.4 10/22/2021  ? PLT 160 10/22/2021  ? ? ?  Chemistry   ?   ?Component Value Date/Time  ? NA 138 10/01/2021 1010  ? K 3.8 10/01/2021 1010  ? CL 106  10/01/2021 1010  ? CO2 28 10/01/2021 1010  ? BUN 13 10/01/2021 1010  ? CREATININE 1.17 10/01/2021 1010  ? CREATININE 0.88 02/21/2020 1115  ?    ?Component Value Date/Time  ? CALCIUM 8.9 10/01/2021 1010  ? ALKPHOS 42 10/01/2021 1010  ? AST 27 10/01/2021 1010  ? ALT 22 10/01/2021 1010  ? BILITOT 0.3 10/01/2021 1010  ?  ? ? ? ?RADIOGRAPHIC STUDIES: ?VAS [name] UPPER EXTREMITY VENOUS DUPLEX ? ?Result Date: 09/26/2021 ?UPPER VENOUS STUDY  Patient Name:  Nicolas Allen  Date of Exam:   09/26/2021 Medical Rec #: [id number]        Accession #:    [id number] Date of Birth: 08/10/1955         Patient Gender: M Patient Age:   66 years Exam Location:  [facility] Procedure:      VAS [name] UPPER EXTREMITY VENOUS DUPLEX Referring Phys: Roosevelt Locks --------------------------------------------------------------------------------  Indications: Edema Risk Factors: Chemotherapy. Comparison Study: No previous exams Performing Technologist: Jody Hill RVT, RDMS  Examination Guidelines: A complete  evaluation includes B-mode imaging, spectral Doppler, color Doppler, and power Doppler as needed of all accessible portions of each vessel. Bilateral testing is considered an integral part of a complete examination. Limited examinations for reoccurring indications may be performed as noted.  Right Findings: +----------+------------+---------+-----------+----------+-------+ RIGHT     CompressiblePhasicitySpontaneousPropertiesSummary +----------+------------+---------+-----------+----------+-------+ IJV           Full       Yes       Yes                      +----------+------------+---------+-----------+----------+-------+ Subclavian    Full       Yes       Yes                      +----------+------------+---------+-----------+----------+-------+ Axillary      Full       Yes       Yes                      +----------+------------+---------+-----------+----------+-------+ Brachial      Full       Yes        Yes                      +----------+------------+---------+-----------+----------+-------+ Radial        Full                                          +----------+------------+---------+-----------+----------+-------+ Ulnar         Full

## 2021-10-22 NOTE — Patient Instructions (Signed)
[facility]  Discharge Instructions: Thank you for choosing [facility] to provide your oncology and hematology care.   If you have a lab appointment with the Cancer Center, please go directly to the Cancer Center and check in at the registration area.   Wear comfortable clothing and clothing appropriate for easy access to any Portacath or PICC line.   We strive to give you quality time with your provider. You may need to reschedule your appointment if you arrive late (15 or more minutes).  Arriving late affects you and other patients whose appointments are after yours.  Also, if you miss three or more appointments without notifying the office, you may be dismissed from the clinic at the provider's discretion.      For prescription refill requests, have your pharmacy contact our office and allow 72 hours for refills to be completed.    Today you received the following chemotherapy and/or immunotherapy agents keytruda, alimta      To help prevent nausea and vomiting after your treatment, we encourage you to take your nausea medication as directed.  BELOW ARE SYMPTOMS THAT SHOULD BE REPORTED IMMEDIATELY: *FEVER GREATER THAN 100.4 F (38 C) OR HIGHER *CHILLS OR SWEATING *NAUSEA AND VOMITING THAT IS NOT CONTROLLED WITH YOUR NAUSEA MEDICATION *UNUSUAL SHORTNESS OF BREATH *UNUSUAL BRUISING OR BLEEDING *URINARY PROBLEMS (pain or burning when urinating, or frequent urination) *BOWEL PROBLEMS (unusual diarrhea, constipation, pain near the anus) TENDERNESS IN MOUTH AND THROAT WITH OR WITHOUT PRESENCE OF ULCERS (sore throat, sores in mouth, or a toothache) UNUSUAL RASH, SWELLING OR PAIN  UNUSUAL VAGINAL DISCHARGE OR ITCHING   Items with * indicate a potential emergency and should be followed up as soon as possible or go to the Emergency Department if any problems should occur.  Please show the CHEMOTHERAPY ALERT CARD or IMMUNOTHERAPY ALERT CARD at  check-in to the Emergency Department and triage nurse.  Should you have questions after your visit or need to cancel or reschedule your appointment, please contact [facility]  Dept: [phone]  and follow the prompts.  Office hours are 8:00 a.m. to 4:30 p.m. Monday - Friday. Please note that voicemails left after 4:00 p.m. may not be returned until the following business day.  We are closed weekends and major holidays. You have access to a nurse at all times for urgent questions. Please call the main number to the clinic Dept: [phone] and follow the prompts.   For any non-urgent questions, you may also contact your provider using MyChart. We now offer e-Visits for anyone 18 and older to request care online for non-urgent symptoms. For details visit [email or web address].   Also download the MyChart app! Go to the app store, search "MyChart", open the app, select , and log in with your MyChart username and password.  Due to Covid, a mask is required upon entering the hospital/clinic. If you do not have a mask, one will be given to you upon arrival. For doctor visits, patients may have 1 support person aged 18 or older with them. For treatment visits, patients cannot have anyone with them due to current Covid guidelines and our immunocompromised population.   

## 2021-10-29 ENCOUNTER — Ambulatory Visit: Payer: 59 | Admitting: Internal Medicine

## 2021-11-06 NOTE — Progress Notes (Signed)
[facility] OFFICE PROGRESS NOTE  Nicolas Borg, MD [place] [address]  DIAGNOSIS: Stage IV (T3, N2, M1c) non-small cell lung cancer favoring adenocarcinoma presented with right upper lobe lung mass in addition to right lower lobe, left lower lobe in addition to right hilar and mediastinal lymphadenopathy in addition to metastatic brain lesions diagnosed in October 2021.   Molecular studies by [employer]:   [id number], 9.5%,   PRIOR THERAPY:  SRS to 3 brain lesion under the care of Dr. Lisbeth Renshaw.  Completed on April 20, 2020  CURRENT THERAPY: Systemic chemotherapy with carboplatin for AUC of 5, Alimta 500 mg/M2 and Keytruda 200 mg IV every 3 weeks.  First dose April 24, 2020.  Status post 27 cycles.  Starting from cycle #5 the patient will be on maintenance treatment with Alimta and Keytruda every 3 weeks. 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 This was resumed starting from cycle #24.   INTERVAL HISTORY: Nicolas Allen 66 y.o. male returns to the clinic today for a follow-up visit.  The patient is feeling fairly well today without any concerning complaints.  He is currently undergoing treatment with alimta and [place]. 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 This was resumed from cycle #24 and he tolerated his last cycle well without any concerning adverse side effects. He denies any fever, chills, night sweats, or unexplained weight loss.  Denies any nausea or vomiting.  Denies any abnormal diarrhea. He sometimes has constipation. He takes 1 stool softener daily. Denies any chest pain, shortness of breath, or hemoptysis.  Denies any significant cough.  Denies any headache or visual changes. The patient is here today for evaluation and repeat blood work before starting cycle #28  MEDICAL HISTORY: Past Medical History:  Diagnosis Date   Anemia 06/24/2012    ANXIETY 02/03/2007   Cervical disc disease 02/12/2012   S/p surgury jan 2013   Erectile dysfunction 02/11/2011   GERD (gastroesophageal reflux disease)    GLUCOSE INTOLERANCE 01/04/2008   HYPERLIPIDEMIA 02/03/2007   HYPERTENSION 02/03/2007   IBS (irritable bowel syndrome)    Impaired glucose tolerance 02/11/2011   nscl ca 03/30/2020   Smoker 08/22/2014   Substance abuse ([place]) 2001   sober for 16 yrs    ALLERGIES:  has No Known Allergies.  MEDICATIONS:  Current Outpatient Medications  Medication Sig Dispense Refill   amLODipine-benazepril (LOTREL) 10-20 MG capsule Take 1 capsule by mouth once daily 90 capsule 1   aspirin 325 MG EC tablet Take 1 tablet (325 mg total) by mouth daily. 90 tablet 99   b complex vitamins tablet Take 1 tablet by mouth daily.     cholecalciferol (VITAMIN D3) 25 MCG (1000 UNIT) tablet Take 1,000 Units by mouth daily.     folic acid (FOLVITE) 1 MG tablet Take 1 tablet (1 mg total) by mouth daily. 90 tablet 3   Garlic 5027 MG CAPS Take 1,000 mg by mouth daily.      Omega-3 Fatty Acids (FISH OIL PO) Take 1 capsule by mouth daily.      polyethylene glycol (MIRALAX / GLYCOLAX) 17 g packet Take 17 g by mouth daily as needed.     prochlorperazine (COMPAZINE) 10 MG tablet Take 1 tablet (10 mg total) by mouth every 6 (six) hours as needed for nausea or vomiting. 30 tablet 0   rosuvastatin (CRESTOR) 20 MG tablet Take  20 mg by mouth daily.     vardenafil (LEVITRA) 20 MG tablet TAKE 1 TABLET BY MOUTH AS NEEDED FOR  ERECTILE  DYSFUNCTION 10 tablet 5   lidocaine-prilocaine (EMLA) cream Apply to the Port-A-Cath site 30-60 minutes before chemotherapy. (Patient not taking: Reported on 11/12/2021) 30 g 0   No current facility-administered medications for this visit.    SURGICAL HISTORY:  Past Surgical History:  Procedure Laterality Date   BRONCHIAL BIOPSY  03/30/2020   Procedure: BRONCHIAL BIOPSIES;  Surgeon: Garner Nash, DO;  Location: [facility];  Service:  Pulmonary;;   BRONCHIAL BRUSHINGS  03/30/2020   Procedure: BRONCHIAL BRUSHINGS;  Surgeon: Garner Nash, DO;  Location: [facility];  Service: Pulmonary;;   BRONCHIAL NEEDLE ASPIRATION BIOPSY  03/30/2020   Procedure: BRONCHIAL NEEDLE ASPIRATION BIOPSIES;  Surgeon: Garner Nash, DO;  Location: [facility];  Service: Pulmonary;;   BRONCHIAL WASHINGS  03/30/2020   Procedure: BRONCHIAL WASHINGS;  Surgeon: Garner Nash, DO;  Location: [facility];  Service: Pulmonary;;   COLONOSCOPY  2007   IR IMAGING GUIDED PORT INSERTION  04/23/2020   neck fusion  2012   C4   NO PAST SURGERIES     VIDEO BRONCHOSCOPY WITH ENDOBRONCHIAL NAVIGATION N/A 03/30/2020   Procedure: VIDEO BRONCHOSCOPY WITH ENDOBRONCHIAL NAVIGATION;  Surgeon: Garner Nash, DO;  Location: [facility];  Service: Pulmonary;  Laterality: N/A;   VIDEO BRONCHOSCOPY WITH ENDOBRONCHIAL ULTRASOUND N/A 03/30/2020   Procedure: VIDEO BRONCHOSCOPY WITH ENDOBRONCHIAL ULTRASOUND;  Surgeon: Garner Nash, DO;  Location: [facility];  Service: Pulmonary;  Laterality: N/A;    REVIEW OF SYSTEMS:   Constitutional: Negative for appetite change, chills, fatigue, fever and unexpected weight change.  HENT:  Negative for mouth sores, nosebleeds, sore throat and trouble swallowing.   Eyes: Negative for eye problems and icterus.  Respiratory: Negative for cough, hemoptysis, shortness of breath and wheezing.   Cardiovascular: Negative for chest pain and leg swelling.  Gastrointestinal: Positive for constipation. Negative for abdominal pain, diarrhea, nausea and vomiting.  Genitourinary: Negative for bladder incontinence, difficulty urinating, dysuria, frequency and hematuria.   Musculoskeletal: Negative for back pain, gait problem, neck pain and neck stiffness.  Positive for mild/minimal right upper extremity swelling.  Skin: Negative for itching and rash.  Neurological: Negative for dizziness, extremity weakness, gait problem, headaches,  light-headedness and seizures.  Hematological: Negative for adenopathy. Does not bruise/bleed easily.  Psychiatric/Behavioral: Negative for confusion, depression and sleep disturbance. The patient is not nervous/anxious.       PHYSICAL EXAMINATION:  Blood pressure 117/65, pulse 77, temperature 98 F (36.7 C), temperature source Axillary, resp. rate 18, height 6' (1.829 m), weight 175 lb 6.4 oz (79.6 kg), SpO2 98 %.  ECOG PERFORMANCE STATUS: 1  Physical Exam  Constitutional: Oriented to person, place, and time and well-developed, well-nourished, and in no distress. HENT:  Head: Normocephalic and atraumatic.  Mouth/Throat: Oropharynx is clear and moist. No oropharyngeal exudate.  Eyes: Conjunctivae are normal. Right eye exhibits no discharge. Left eye exhibits no discharge. No scleral icterus.  Neck: Normal range of motion. Neck supple.  Cardiovascular: Normal rate, regular rhythm, normal heart sounds and intact distal pulses.   Pulmonary/Chest: Effort normal and breath sounds normal. No respiratory distress. No wheezes. No rales.  Abdominal: Soft. Bowel sounds are normal. Exhibits no distension and no mass. There is no tenderness.  Musculoskeletal: Normal range of motion. Exhibits no edema.  Positive for mild swelling in the right hand (reportedly improved compared to prior) Lymphadenopathy:  No cervical adenopathy.  Neurological: Alert and oriented to person, place, and time. Exhibits normal muscle tone. Gait normal. Coordination normal.  Skin: Skin is warm and dry. No rash noted. Not diaphoretic. No erythema. No pallor.  Psychiatric: Mood, memory and judgment normal.  Vitals reviewed.  LABORATORY DATA: Lab Results  Component Value Date   WBC 5.3 11/12/2021   HGB 10.9 (L) 11/12/2021   HCT 32.8 (L) 11/12/2021   MCV 84.3 11/12/2021   PLT 173 11/12/2021      Chemistry      Component Value Date/Time   NA 137 11/12/2021 1009   K 3.7 11/12/2021 1009   CL 105 11/12/2021 1009    CO2 28 11/12/2021 1009   BUN 14 11/12/2021 1009   CREATININE 1.22 11/12/2021 1009   CREATININE 0.88 02/21/2020 1115      Component Value Date/Time   CALCIUM 9.2 11/12/2021 1009   ALKPHOS 45 11/12/2021 1009   AST 25 11/12/2021 1009   ALT 19 11/12/2021 1009   BILITOT 0.3 11/12/2021 1009       RADIOGRAPHIC STUDIES:  No results found.   ASSESSMENT/PLAN:  This is a very pleasant 66 year old African-American male diagnosed with stage IV (T3, N2, M1C) non-small cell lung cancer, adenocarcinoma.  He presented with a right upper lobe lung mass in addition to a right lower lobe, left lower lobe lung lesions with right hilar and mediastinal lymphadenopathy.  He also had metastatic disease to the brain.  He was diagnosed in October 2022.  His molecular studies by [employer]ant 360 did not show any actionable mutations.   The patient underwent SRS treatment to the metastatic brain lesions under the care of Dr. Lisbeth Renshaw.  This was completed on 04/20/2021.   The patient is currently undergoing systemic chemotherapy/immunotherapy.  He started with carboplatin for an AUC of 5, Alimta 500 mg per metered squared, Keytruda 200 mg IV every 3 weeks.  He is status post 27 cycles.  Starting from cycle #5, the patient has been on maintenance treatment with Alimta and Keytruda IV every 3 weeks. Starting from cycle #22, the patient has been on single agent alimta. [name] has been on hold due to CT findings of suspicious immunotherapy mediated enteritis. [name] was resumed with cycle #24 and he has been tolerating it well.    Labs were reviewed. Recommend that he proceed  with cycle 28 today as scheduled.    We will see him back for a follow up visit in 3 weeks for evaluation and repeat blood work with cycle #29.   I will arrange for restaging CT scan of the chest, abdomen, pelvis prior to starting his next cycle of treatment.  I reviewed constipation education with the patient today. Advised him to check the  bottle on his stool softener. It is likely this can be titrated up to 1 tablet BID. The goal would be to have a bowel movement every day or every other day. If he does not have a bowel movement every day or every other day, then advised to take a laxative. Discussed that other modifications to reduce the risk of constipation include being as active as possible, eating fruits/veggies, and drinking plenty of fluids.    The patient was advised to call immediately if he has any concerning symptoms in the interval. The patient voices understanding of current disease status and treatment options and is in agreement with the current care plan. All questions were answered. The patient knows to call the clinic with any problems,  questions or concerns. We can certainly see the patient much sooner if necessary        Orders Placed This Encounter  Procedures   CT Chest W Contrast    Standing Status:   Future    Standing Expiration Date:   11/12/2022    Order Specific Question:   If indicated for the ordered procedure, I authorize the administration of contrast media per Radiology protocol    Answer:   Yes    Order Specific Question:   Preferred imaging location?    Answer:   [facility]   CT Abdomen Pelvis W Contrast    Standing Status:   Future    Standing Expiration Date:   11/12/2022    Order Specific Question:   If indicated for the ordered procedure, I authorize the administration of contrast media per Radiology protocol    Answer:   Yes    Order Specific Question:   Preferred imaging location?    Answer:   [facility]    Order Specific Question:   Is Oral Contrast requested for this exam?    Answer:   Yes, Per Radiology protocol     The total time spent in the appointment was 20-29 minutes.   Kanyia Heaslip L Stefanee Mckell, PA-C 11/12/21

## 2021-11-12 ENCOUNTER — Encounter: Payer: Self-pay | Admitting: Physician Assistant

## 2021-11-12 ENCOUNTER — Other Ambulatory Visit: Payer: Self-pay

## 2021-11-12 ENCOUNTER — Inpatient Hospital Stay: Payer: 59

## 2021-11-12 ENCOUNTER — Inpatient Hospital Stay: Payer: 59 | Admitting: Physician Assistant

## 2021-11-12 VITALS — BP 117/65 | HR 77 | Temp 98.0°F | Resp 18 | Ht 72.0 in | Wt 175.4 lb

## 2021-11-12 DIAGNOSIS — Z95828 Presence of other vascular implants and grafts: Secondary | ICD-10-CM

## 2021-11-12 DIAGNOSIS — Z5112 Encounter for antineoplastic immunotherapy: Secondary | ICD-10-CM

## 2021-11-12 DIAGNOSIS — Z5111 Encounter for antineoplastic chemotherapy: Secondary | ICD-10-CM | POA: Diagnosis not present

## 2021-11-12 DIAGNOSIS — C3491 Malignant neoplasm of unspecified part of right bronchus or lung: Secondary | ICD-10-CM

## 2021-11-12 LAB — CBC WITH DIFFERENTIAL (CANCER CENTER ONLY)
Abs Immature Granulocytes: 0.01 10*3/uL (ref 0.00–0.07)
Basophils Absolute: 0 10*3/uL (ref 0.0–0.1)
Basophils Relative: 0 %
Eosinophils Absolute: 0.1 10*3/uL (ref 0.0–0.5)
Eosinophils Relative: 2 %
HCT: 32.8 % — ABNORMAL LOW (ref 39.0–52.0)
Hemoglobin: 10.9 g/dL — ABNORMAL LOW (ref 13.0–17.0)
Immature Granulocytes: 0 %
Lymphocytes Relative: 33 %
Lymphs Abs: 1.7 10*3/uL (ref 0.7–4.0)
MCH: 28 pg (ref 26.0–34.0)
MCHC: 33.2 g/dL (ref 30.0–36.0)
MCV: 84.3 fL (ref 80.0–100.0)
Monocytes Absolute: 0.6 10*3/uL (ref 0.1–1.0)
Monocytes Relative: 11 %
Neutro Abs: 2.9 10*3/uL (ref 1.7–7.7)
Neutrophils Relative %: 54 %
Platelet Count: 173 10*3/uL (ref 150–400)
RBC: 3.89 MIL/uL — ABNORMAL LOW (ref 4.22–5.81)
RDW: 15.6 % — ABNORMAL HIGH (ref 11.5–15.5)
WBC Count: 5.3 10*3/uL (ref 4.0–10.5)
nRBC: 0 % (ref 0.0–0.2)

## 2021-11-12 LAB — CMP (CANCER CENTER ONLY)
ALT: 19 U/L (ref 0–44)
AST: 25 U/L (ref 15–41)
Albumin: 3.6 g/dL (ref 3.5–5.0)
Alkaline Phosphatase: 45 U/L (ref 38–126)
Anion gap: 4 — ABNORMAL LOW (ref 5–15)
BUN: 14 mg/dL (ref 8–23)
CO2: 28 mmol/L (ref 22–32)
Calcium: 9.2 mg/dL (ref 8.9–10.3)
Chloride: 105 mmol/L (ref 98–111)
Creatinine: 1.22 mg/dL (ref 0.61–1.24)
GFR, Estimated: >60 mL/min (ref >60)
Glucose, Bld: 141 mg/dL — ABNORMAL HIGH (ref 70–99)
Potassium: 3.7 mmol/L (ref 3.5–5.1)
Sodium: 137 mmol/L (ref 135–145)
Total Bilirubin: 0.3 mg/dL (ref 0.3–1.2)
Total Protein: 6.9 g/dL (ref 6.5–8.1)

## 2021-11-12 LAB — TSH: TSH: 1.973 u[IU]/mL (ref 0.350–4.500)

## 2021-11-12 MED ORDER — HEPARIN SOD (PORK) LOCK FLUSH 100 UNIT/ML IV SOLN
500.0000 [IU] | Freq: Once | INTRAVENOUS | Status: AC | PRN
  Administered 2021-11-12: 500 [IU]

## 2021-11-12 MED ORDER — SODIUM CHLORIDE 0.9% FLUSH
10.0000 mL | Freq: Once | INTRAVENOUS | Status: AC
  Administered 2021-11-12: 10 mL

## 2021-11-12 MED ORDER — SODIUM CHLORIDE 0.9 % IV SOLN: Freq: Once | INTRAVENOUS | Status: AC

## 2021-11-12 MED ORDER — PROCHLORPERAZINE MALEATE 10 MG PO TABS
10.0000 mg | ORAL_TABLET | Freq: Once | ORAL | Status: AC
  Administered 2021-11-12: 10 mg via ORAL
  Filled 2021-11-12: qty 1

## 2021-11-12 MED ORDER — SODIUM CHLORIDE 0.9 % IV SOLN
200.0000 mg | Freq: Once | INTRAVENOUS | Status: AC
  Administered 2021-11-12: 200 mg via INTRAVENOUS
  Filled 2021-11-12: qty 200

## 2021-11-12 MED ORDER — SODIUM CHLORIDE 0.9 % IV SOLN
500.0000 mg/m2 | Freq: Once | INTRAVENOUS | Status: AC
  Administered 2021-11-12: 1000 mg via INTRAVENOUS
  Filled 2021-11-12: qty 40

## 2021-11-12 MED ORDER — SODIUM CHLORIDE 0.9% FLUSH
10.0000 mL | INTRAVENOUS | Status: DC | PRN
  Administered 2021-11-12: 10 mL

## 2021-11-12 NOTE — Patient Instructions (Signed)
[facility]  Discharge Instructions: Thank you for choosing [facility] to provide your oncology and hematology care.   If you have a lab appointment with the Cancer Center, please go directly to the Cancer Center and check in at the registration area.   Wear comfortable clothing and clothing appropriate for easy access to any Portacath or PICC line.   We strive to give you quality time with your provider. You may need to reschedule your appointment if you arrive late (15 or more minutes).  Arriving late affects you and other patients whose appointments are after yours.  Also, if you miss three or more appointments without notifying the office, you may be dismissed from the clinic at the provider's discretion.      For prescription refill requests, have your pharmacy contact our office and allow 72 hours for refills to be completed.    Today you received the following chemotherapy and/or immunotherapy agents keytruda, alimta      To help prevent nausea and vomiting after your treatment, we encourage you to take your nausea medication as directed.  BELOW ARE SYMPTOMS THAT SHOULD BE REPORTED IMMEDIATELY: *FEVER GREATER THAN 100.4 F (38 C) OR HIGHER *CHILLS OR SWEATING *NAUSEA AND VOMITING THAT IS NOT CONTROLLED WITH YOUR NAUSEA MEDICATION *UNUSUAL SHORTNESS OF BREATH *UNUSUAL BRUISING OR BLEEDING *URINARY PROBLEMS (pain or burning when urinating, or frequent urination) *BOWEL PROBLEMS (unusual diarrhea, constipation, pain near the anus) TENDERNESS IN MOUTH AND THROAT WITH OR WITHOUT PRESENCE OF ULCERS (sore throat, sores in mouth, or a toothache) UNUSUAL RASH, SWELLING OR PAIN  UNUSUAL VAGINAL DISCHARGE OR ITCHING   Items with * indicate a potential emergency and should be followed up as soon as possible or go to the Emergency Department if any problems should occur.  Please show the CHEMOTHERAPY ALERT CARD or IMMUNOTHERAPY ALERT CARD at  check-in to the Emergency Department and triage nurse.  Should you have questions after your visit or need to cancel or reschedule your appointment, please contact [facility]  Dept: [phone]  and follow the prompts.  Office hours are 8:00 a.m. to 4:30 p.m. Monday - Friday. Please note that voicemails left after 4:00 p.m. may not be returned until the following business day.  We are closed weekends and major holidays. You have access to a nurse at all times for urgent questions. Please call the main number to the clinic Dept: [phone] and follow the prompts.   For any non-urgent questions, you may also contact your provider using MyChart. We now offer e-Visits for anyone 18 and older to request care online for non-urgent symptoms. For details visit [email or web address].   Also download the MyChart app! Go to the app store, search "MyChart", open the app, select , and log in with your MyChart username and password.  Due to Covid, a mask is required upon entering the hospital/clinic. If you do not have a mask, one will be given to you upon arrival. For doctor visits, patients may have 1 support person aged 18 or older with them. For treatment visits, patients cannot have anyone with them due to current Covid guidelines and our immunocompromised population.   

## 2021-12-03 ENCOUNTER — Inpatient Hospital Stay: Payer: 59

## 2021-12-03 ENCOUNTER — Inpatient Hospital Stay: Payer: 59 | Attending: Internal Medicine

## 2021-12-03 ENCOUNTER — Inpatient Hospital Stay: Payer: 59 | Admitting: Internal Medicine

## 2021-12-03 ENCOUNTER — Other Ambulatory Visit: Payer: Self-pay

## 2021-12-03 VITALS — BP 113/65 | HR 80 | Temp 98.1°F | Resp 15 | Ht 72.0 in | Wt 174.4 lb

## 2021-12-03 VITALS — BP 102/50 | HR 64 | Temp 97.9°F | Resp 16

## 2021-12-03 DIAGNOSIS — Z79899 Other long term (current) drug therapy: Secondary | ICD-10-CM | POA: Diagnosis not present

## 2021-12-03 DIAGNOSIS — Z87891 Personal history of nicotine dependence: Secondary | ICD-10-CM | POA: Diagnosis not present

## 2021-12-03 DIAGNOSIS — Z5112 Encounter for antineoplastic immunotherapy: Secondary | ICD-10-CM | POA: Insufficient documentation

## 2021-12-03 DIAGNOSIS — Z95828 Presence of other vascular implants and grafts: Secondary | ICD-10-CM

## 2021-12-03 DIAGNOSIS — R5383 Other fatigue: Secondary | ICD-10-CM | POA: Insufficient documentation

## 2021-12-03 DIAGNOSIS — C3431 Malignant neoplasm of lower lobe, right bronchus or lung: Secondary | ICD-10-CM | POA: Insufficient documentation

## 2021-12-03 DIAGNOSIS — C3491 Malignant neoplasm of unspecified part of right bronchus or lung: Secondary | ICD-10-CM

## 2021-12-03 DIAGNOSIS — Z5111 Encounter for antineoplastic chemotherapy: Secondary | ICD-10-CM | POA: Diagnosis present

## 2021-12-03 DIAGNOSIS — C7931 Secondary malignant neoplasm of brain: Secondary | ICD-10-CM | POA: Diagnosis not present

## 2021-12-03 DIAGNOSIS — C3411 Malignant neoplasm of upper lobe, right bronchus or lung: Secondary | ICD-10-CM | POA: Diagnosis present

## 2021-12-03 LAB — CMP (CANCER CENTER ONLY)
ALT: 21 U/L (ref 0–44)
AST: 31 U/L (ref 15–41)
Albumin: 3.5 g/dL (ref 3.5–5.0)
Alkaline Phosphatase: 44 U/L (ref 38–126)
Anion gap: 5 (ref 5–15)
BUN: 13 mg/dL (ref 8–23)
CO2: 28 mmol/L (ref 22–32)
Calcium: 9.1 mg/dL (ref 8.9–10.3)
Chloride: 107 mmol/L (ref 98–111)
Creatinine: 1.3 mg/dL — ABNORMAL HIGH (ref 0.61–1.24)
GFR, Estimated: >60 mL/min (ref >60)
Glucose, Bld: 86 mg/dL (ref 70–99)
Potassium: 3.8 mmol/L (ref 3.5–5.1)
Sodium: 140 mmol/L (ref 135–145)
Total Bilirubin: 0.3 mg/dL (ref 0.3–1.2)
Total Protein: 6.9 g/dL (ref 6.5–8.1)

## 2021-12-03 LAB — CBC WITH DIFFERENTIAL (CANCER CENTER ONLY)
Abs Immature Granulocytes: 0.01 10*3/uL (ref 0.00–0.07)
Basophils Absolute: 0 10*3/uL (ref 0.0–0.1)
Basophils Relative: 1 %
Eosinophils Absolute: 0.2 10*3/uL (ref 0.0–0.5)
Eosinophils Relative: 3 %
HCT: 31.9 % — ABNORMAL LOW (ref 39.0–52.0)
Hemoglobin: 10.9 g/dL — ABNORMAL LOW (ref 13.0–17.0)
Immature Granulocytes: 0 %
Lymphocytes Relative: 45 %
Lymphs Abs: 2.5 10*3/uL (ref 0.7–4.0)
MCH: 29 pg (ref 26.0–34.0)
MCHC: 34.2 g/dL (ref 30.0–36.0)
MCV: 84.8 fL (ref 80.0–100.0)
Monocytes Absolute: 0.7 10*3/uL (ref 0.1–1.0)
Monocytes Relative: 13 %
Neutro Abs: 2.1 10*3/uL (ref 1.7–7.7)
Neutrophils Relative %: 38 %
Platelet Count: 152 10*3/uL (ref 150–400)
RBC: 3.76 MIL/uL — ABNORMAL LOW (ref 4.22–5.81)
RDW: 16.2 % — ABNORMAL HIGH (ref 11.5–15.5)
WBC Count: 5.5 10*3/uL (ref 4.0–10.5)
nRBC: 0 % (ref 0.0–0.2)

## 2021-12-03 LAB — TSH: TSH: 2.655 u[IU]/mL (ref 0.350–4.500)

## 2021-12-03 MED ORDER — SODIUM CHLORIDE 0.9 % IV SOLN
500.0000 mg/m2 | Freq: Once | INTRAVENOUS | Status: AC
  Administered 2021-12-03: 1000 mg via INTRAVENOUS
  Filled 2021-12-03: qty 40

## 2021-12-03 MED ORDER — HEPARIN SOD (PORK) LOCK FLUSH 100 UNIT/ML IV SOLN
500.0000 [IU] | Freq: Once | INTRAVENOUS | Status: AC | PRN
  Administered 2021-12-03: 500 [IU]

## 2021-12-03 MED ORDER — SODIUM CHLORIDE 0.9 % IV SOLN: Freq: Once | INTRAVENOUS | Status: AC

## 2021-12-03 MED ORDER — PROCHLORPERAZINE MALEATE 10 MG PO TABS
10.0000 mg | ORAL_TABLET | Freq: Once | ORAL | Status: AC
  Administered 2021-12-03: 10 mg via ORAL
  Filled 2021-12-03: qty 1

## 2021-12-03 MED ORDER — SODIUM CHLORIDE 0.9% FLUSH
10.0000 mL | Freq: Once | INTRAVENOUS | Status: AC
  Administered 2021-12-03: 10 mL

## 2021-12-03 MED ORDER — SODIUM CHLORIDE 0.9% FLUSH
10.0000 mL | INTRAVENOUS | Status: DC | PRN
  Administered 2021-12-03: 10 mL

## 2021-12-03 MED ORDER — SODIUM CHLORIDE 0.9 % IV SOLN
200.0000 mg | Freq: Once | INTRAVENOUS | Status: AC
  Administered 2021-12-03: 200 mg via INTRAVENOUS
  Filled 2021-12-03: qty 200

## 2021-12-03 NOTE — Patient Instructions (Signed)
[facility]  Discharge Instructions: Thank you for choosing [facility] to provide your oncology and hematology care.   If you have a lab appointment with the Cancer Center, please go directly to the Cancer Center and check in at the registration area.   Wear comfortable clothing and clothing appropriate for easy access to any Portacath or PICC line.   We strive to give you quality time with your provider. You may need to reschedule your appointment if you arrive late (15 or more minutes).  Arriving late affects you and other patients whose appointments are after yours.  Also, if you miss three or more appointments without notifying the office, you may be dismissed from the clinic at the provider's discretion.      For prescription refill requests, have your pharmacy contact our office and allow 72 hours for refills to be completed.    Today you received the following chemotherapy and/or immunotherapy agents keytruda, alimta      To help prevent nausea and vomiting after your treatment, we encourage you to take your nausea medication as directed.  BELOW ARE SYMPTOMS THAT SHOULD BE REPORTED IMMEDIATELY: *FEVER GREATER THAN 100.4 F (38 C) OR HIGHER *CHILLS OR SWEATING *NAUSEA AND VOMITING THAT IS NOT CONTROLLED WITH YOUR NAUSEA MEDICATION *UNUSUAL SHORTNESS OF BREATH *UNUSUAL BRUISING OR BLEEDING *URINARY PROBLEMS (pain or burning when urinating, or frequent urination) *BOWEL PROBLEMS (unusual diarrhea, constipation, pain near the anus) TENDERNESS IN MOUTH AND THROAT WITH OR WITHOUT PRESENCE OF ULCERS (sore throat, sores in mouth, or a toothache) UNUSUAL RASH, SWELLING OR PAIN  UNUSUAL VAGINAL DISCHARGE OR ITCHING   Items with * indicate a potential emergency and should be followed up as soon as possible or go to the Emergency Department if any problems should occur.  Please show the CHEMOTHERAPY ALERT CARD or IMMUNOTHERAPY ALERT CARD at  check-in to the Emergency Department and triage nurse.  Should you have questions after your visit or need to cancel or reschedule your appointment, please contact [facility]  Dept: [phone]  and follow the prompts.  Office hours are 8:00 a.m. to 4:30 p.m. Monday - Friday. Please note that voicemails left after 4:00 p.m. may not be returned until the following business day.  We are closed weekends and major holidays. You have access to a nurse at all times for urgent questions. Please call the main number to the clinic Dept: [phone] and follow the prompts.   For any non-urgent questions, you may also contact your provider using MyChart. We now offer e-Visits for anyone 18 and older to request care online for non-urgent symptoms. For details visit [email or web address].   Also download the MyChart app! Go to the app store, search "MyChart", open the app, select [facility], and log in with your MyChart username and password.  Due to Covid, a mask is required upon entering the hospital/clinic. If you do not have a mask, one will be given to you upon arrival. For doctor visits, patients may have 1 support person aged 18 or older with them. For treatment visits, patients cannot have anyone with them due to current Covid guidelines and our immunocompromised population.   

## 2021-12-03 NOTE — Progress Notes (Signed)
[facility] [phone]   [phone]  OFFICE PROGRESS NOTE  [name], MD [place] [address]  DIAGNOSIS: Stage IV (T3, N2, M1c) non-small cell lung cancer favoring adenocarcinoma presented with right upper lobe lung mass in addition to right lower lobe, left lower lobe in addition to right hilar and mediastinal lymphadenopathy in addition to metastatic brain lesions diagnosed in October 2021.  Molecular studies by [employer]:  [id number], 9.5%,   PRIOR THERAPY: SRS to 3 brain lesion under the care of Dr. Lisbeth Renshaw.  CURRENT THERAPY: Systemic chemotherapy with carboplatin for AUC of 5, Alimta 500 mg/M2 and Keytruda 200 mg IV every 3 weeks.  First dose April 24, 2020.  Status post 28 cycles.  Starting from cycle #5 the patient will be on maintenance treatment with Alimta and Keytruda every 3 weeks.  INTERVAL HISTORY: Nicolas Allen 66 y.o. male returns to the clinic today for follow-up visit.  The patient is feeling fine today with no concerning complaints.  He denied having any chest pain, shortness of breath, cough or hemoptysis.  He denied having any fever or chills.  He has no nausea, vomiting, diarrhea or constipation.  He has no headache or visual changes.  He continues to tolerate his treatment with maintenance Alimta and Keytruda fairly well.  The patient is here today for evaluation before starting cycle #29.  He was supposed to have repeat CT scan of the chest, abdomen and pelvis before this visit but unfortunately it is scheduled to be done on 12/05/2021.  MEDICAL HISTORY: Past Medical History:  Diagnosis Date   Anemia 06/24/2012   ANXIETY 02/03/2007   Cervical disc disease 02/12/2012   S/p surgury jan 2013   Erectile dysfunction 02/11/2011   GERD (gastroesophageal reflux disease)    GLUCOSE INTOLERANCE 01/04/2008   HYPERLIPIDEMIA 02/03/2007   HYPERTENSION 02/03/2007   IBS (irritable bowel syndrome)     Impaired glucose tolerance 02/11/2011   nscl ca 03/30/2020   Smoker 08/22/2014   Substance abuse ([place]) 2001   sober for 16 yrs    ALLERGIES:  has No Known Allergies.  MEDICATIONS:  Current Outpatient Medications  Medication Sig Dispense Refill   amLODipine-benazepril (LOTREL) 10-20 MG capsule Take 1 capsule by mouth once daily 90 capsule 1   aspirin 325 MG EC tablet Take 1 tablet (325 mg total) by mouth daily. 90 tablet 99   b complex vitamins tablet Take 1 tablet by mouth daily.     cholecalciferol (VITAMIN D3) 25 MCG (1000 UNIT) tablet Take 1,000 Units by mouth daily.     folic acid (FOLVITE) 1 MG tablet Take 1 tablet (1 mg total) by mouth daily. 90 tablet 3   Garlic 5465 MG CAPS Take 1,000 mg by mouth daily.      lidocaine-prilocaine (EMLA) cream Apply to the Port-A-Cath site 30-60 minutes before chemotherapy. (Patient not taking: Reported on 11/12/2021) 30 g 0   Omega-3 Fatty Acids (FISH OIL PO) Take 1 capsule by mouth daily.      polyethylene glycol (MIRALAX / GLYCOLAX) 17 g packet Take 17 g by mouth daily as needed.     prochlorperazine (COMPAZINE) 10 MG tablet Take 1 tablet (10 mg total) by mouth every 6 (six) hours as needed for nausea or vomiting. 30 tablet 0   rosuvastatin (CRESTOR) 20 MG tablet Take 20 mg by mouth daily.     vardenafil (LEVITRA) 20 MG tablet TAKE 1 TABLET BY  MOUTH AS NEEDED FOR  ERECTILE  DYSFUNCTION 10 tablet 5   No current facility-administered medications for this visit.    SURGICAL HISTORY:  Past Surgical History:  Procedure Laterality Date   BRONCHIAL BIOPSY  03/30/2020   Procedure: BRONCHIAL BIOPSIES;  Surgeon: Garner Nash, DO;  Location: [facility];  Service: Pulmonary;;   BRONCHIAL BRUSHINGS  03/30/2020   Procedure: BRONCHIAL BRUSHINGS;  Surgeon: Garner Nash, DO;  Location: [facility];  Service: Pulmonary;;   BRONCHIAL NEEDLE ASPIRATION BIOPSY  03/30/2020   Procedure: BRONCHIAL NEEDLE ASPIRATION BIOPSIES;  Surgeon: Garner Nash,  DO;  Location: [facility];  Service: Pulmonary;;   BRONCHIAL WASHINGS  03/30/2020   Procedure: BRONCHIAL WASHINGS;  Surgeon: Garner Nash, DO;  Location: [facility];  Service: Pulmonary;;   COLONOSCOPY  2007   IR IMAGING GUIDED PORT INSERTION  04/23/2020   neck fusion  2012   C4   NO PAST SURGERIES     VIDEO BRONCHOSCOPY WITH ENDOBRONCHIAL NAVIGATION N/A 03/30/2020   Procedure: VIDEO BRONCHOSCOPY WITH ENDOBRONCHIAL NAVIGATION;  Surgeon: Garner Nash, DO;  Location: [facility];  Service: Pulmonary;  Laterality: N/A;   VIDEO BRONCHOSCOPY WITH ENDOBRONCHIAL ULTRASOUND N/A 03/30/2020   Procedure: VIDEO BRONCHOSCOPY WITH ENDOBRONCHIAL ULTRASOUND;  Surgeon: Garner Nash, DO;  Location: [facility];  Service: Pulmonary;  Laterality: N/A;    REVIEW OF SYSTEMS:  A comprehensive review of systems was negative.   PHYSICAL EXAMINATION: General appearance: alert, cooperative, and no distress Head: Normocephalic, without obvious abnormality, atraumatic Neck: no adenopathy, no JVD, supple, symmetrical, trachea midline, and thyroid not enlarged, symmetric, no tenderness/mass/nodules Lymph nodes: Cervical, supraclavicular, and axillary nodes normal. Resp: clear to auscultation bilaterally Back: symmetric, no curvature. ROM normal. No CVA tenderness. Cardio: regular rate and rhythm, S1, S2 normal, no murmur, click, rub or gallop GI: soft, non-tender; bowel sounds normal; no masses,  no organomegaly Extremities: extremities normal, atraumatic, no cyanosis or edema  ECOG PERFORMANCE STATUS: 1 - Symptomatic but completely ambulatory  Blood pressure 113/65, pulse 80, temperature 98.1 F (36.7 C), temperature source Oral, resp. rate 15, height 6' (1.829 m), weight 174 lb 6.4 oz (79.1 kg), SpO2 100 %.  LABORATORY DATA: Lab Results  Component Value Date   WBC 5.5 12/03/2021   HGB 10.9 (L) 12/03/2021   HCT 31.9 (L) 12/03/2021   MCV 84.8 12/03/2021   PLT 152 12/03/2021      Chemistry       Component Value Date/Time   NA 140 12/03/2021 0851   K 3.8 12/03/2021 0851   CL 107 12/03/2021 0851   CO2 28 12/03/2021 0851   BUN 13 12/03/2021 0851   CREATININE 1.30 (H) 12/03/2021 0851   CREATININE 0.88 02/21/2020 1115      Component Value Date/Time   CALCIUM 9.1 12/03/2021 0851   ALKPHOS 44 12/03/2021 0851   AST 31 12/03/2021 0851   ALT 21 12/03/2021 0851   BILITOT 0.3 12/03/2021 0851       RADIOGRAPHIC STUDIES: No results found.   ASSESSMENT AND PLAN: This is a very pleasant 66 years old African-American male recently diagnosed with stage IV (T3, N2, M1c)  non-small cell lung cancer, adenocarcinoma presented with right upper lobe lung mass in addition to right lower lobe, left lower lobe in addition to right hilar and mediastinal lymphadenopathy in addition to metastatic brain lesions diagnosed in October 2021. The patient has molecular studies by [employer] that showed no actionable mutations. He underwent SRS treatment to his brain lesion.  The patient is currently undergoing systemic chemotherapy with carboplatin for AUC of 5, Alimta 500 mg/M2 and Keytruda 200 mg IV every 3 weeks status post 28 cycles.  Starting from cycle #5 the patient is on maintenance treatment with Alimta and Keytruda every 3 weeks. The patient continues to tolerate this treatment well with no concerning adverse effects. I recommended for him to proceed with cycle #29 today as planned. He is scheduled to have repeat CT scan of the chest, abdomen pelvis in 2 days.  If there is no concerning findings on the scan, we will see the patient back for follow-up visit in 3 weeks as previously planned before starting cycle #30. The patient was advised to call immediately if he has any other concerning issues in the interval. The patient voices understanding of current disease status and treatment options and is in agreement with the current care plan.  All questions were answered. The patient knows to call  the clinic with any problems, questions or concerns. We can certainly see the patient much sooner if necessary.  Disclaimer: This note was dictated with voice recognition software. Similar sounding words can inadvertently be transcribed and may not be corrected upon review.

## 2021-12-05 ENCOUNTER — Ambulatory Visit (HOSPITAL_COMMUNITY)
Admission: RE | Admit: 2021-12-05 | Discharge: 2021-12-05 | Disposition: A | Discharge status: Home / Self Care | Payer: 59 | Source: Ambulatory Visit | Attending: Physician Assistant | Admitting: Physician Assistant

## 2021-12-05 DIAGNOSIS — C3491 Malignant neoplasm of unspecified part of right bronchus or lung: Secondary | ICD-10-CM | POA: Diagnosis present

## 2021-12-05 MED ORDER — SODIUM CHLORIDE (PF) 0.9 % IJ SOLN
INTRAMUSCULAR | Status: AC
  Filled 2021-12-05: qty 50

## 2021-12-05 MED ORDER — HEPARIN SOD (PORK) LOCK FLUSH 100 UNIT/ML IV SOLN
500.0000 [IU] | Freq: Once | INTRAVENOUS | Status: AC
  Administered 2021-12-05: 500 [IU] via INTRAVENOUS

## 2021-12-05 MED ORDER — HEPARIN SOD (PORK) LOCK FLUSH 100 UNIT/ML IV SOLN
INTRAVENOUS | Status: AC
  Filled 2021-12-05: qty 5

## 2021-12-05 MED ORDER — IOHEXOL 300 MG/ML  SOLN
100.0000 mL | Freq: Once | INTRAMUSCULAR | Status: AC | PRN
Start: 2021-12-05 — End: 2021-12-05
  Administered 2021-12-05: 100 mL via INTRAVENOUS

## 2021-12-06 ENCOUNTER — Telehealth: Payer: Self-pay | Admitting: Physician Assistant

## 2021-12-06 NOTE — Telephone Encounter (Signed)
The patient had an appointment with Dr. Arbutus Ped earlier this week.  At that point in time his restaging CT scan was not scheduled till 12/05/2021.  The patient had his scan performed which is stable.  Given that the patient's next appointment is not for another 2-1/2 weeks I called him to review the results.  I was unable to reach him as his voicemail is not set up.  We will review the results with the patient at his next appointment if we are unable to reach him.

## 2021-12-19 NOTE — Progress Notes (Signed)
Nicolas Allen OFFICE PROGRESS NOTE  [name], MD [place] [address]  DIAGNOSIS: Stage IV (T3, N2, M1c) non-small cell lung cancer favoring adenocarcinoma presented with right upper lobe lung mass in addition to right lower lobe, left lower lobe in addition to right hilar and mediastinal lymphadenopathy in addition to metastatic brain lesions diagnosed in October 2021.   Molecular studies by [employer]:   [id number], 9.5%,   PRIOR THERAPY: SRS to 3 brain lesion under the care of Dr. Lisbeth Renshaw.  Completed on April 20, 2020  CURRENT THERAPY:  Systemic chemotherapy with carboplatin for AUC of 5, Alimta 500 mg/M2 and Keytruda 200 mg IV every 3 weeks.  First dose April 24, 2020.  Status post 29 cycles.  Starting from cycle #5 the patient will be on maintenance treatment with Alimta and Keytruda every 3 weeks. 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 This was resumed starting from cycle #24.   INTERVAL HISTORY: Nicolas Allen 66 y.o. male returns to the clinic today for a follow-up visit.  The patient is feeling fairly well today without any concerning complaints.  He is currently undergoing treatment with alimta and [place]. 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 This was resumed from cycle #24. He tolerated his last cycle well without any concerning adverse side effects. He denies any fever, chills, night sweats, or unexplained weight loss.  Denies any nausea or vomiting.  Denies any abnormal diarrhea. He sometimes has constipation. He takes 1 stool softener daily. Denies any chest pain, shortness of breath, or hemoptysis.  Denies any significant cough.  Denies any headache or visual changes.  The patient was supposed have a restaging CT scan before his appointment on 12/03/2021.  This was not scheduled till 12/05/2021.  Additionally, a CT of the abdomen pelvis was  performed and the CT scan of the chest was not performed.  The patient is here today for evaluation to review his CT of the abdomen and pelvis before starting cycle #30.    MEDICAL HISTORY: Past Medical History:  Diagnosis Date   Anemia 06/24/2012   ANXIETY 02/03/2007   Cervical disc disease 02/12/2012   S/p surgury jan 2013   Erectile dysfunction 02/11/2011   GERD (gastroesophageal reflux disease)    GLUCOSE INTOLERANCE 01/04/2008   HYPERLIPIDEMIA 02/03/2007   HYPERTENSION 02/03/2007   IBS (irritable bowel syndrome)    Impaired glucose tolerance 02/11/2011   nscl ca 03/30/2020   Smoker 08/22/2014   Substance abuse ([place]) 2001   sober for 16 yrs    ALLERGIES:  has No Known Allergies.  MEDICATIONS:  Current Outpatient Medications  Medication Sig Dispense Refill   amLODipine-benazepril (LOTREL) 10-20 MG capsule Take 1 capsule by mouth once daily 90 capsule 3   aspirin 325 MG EC tablet Take 1 tablet (325 mg total) by mouth daily. 90 tablet 99   b complex vitamins tablet Take 1 tablet by mouth daily.     cholecalciferol (VITAMIN D3) 25 MCG (1000 UNIT) tablet Take 1,000 Units by mouth daily.     folic acid (FOLVITE) 1 MG tablet Take 1 tablet (1 mg total) by mouth daily. 90 tablet 3   Garlic 6045 MG CAPS Take 1,000 mg by mouth daily.      lidocaine-prilocaine (EMLA) cream Apply to the Port-A-Cath site 30-60 minutes before chemotherapy. 30 g 0   Omega-3 Fatty Acids (FISH OIL  PO) Take 1 capsule by mouth daily.      polyethylene glycol (MIRALAX / GLYCOLAX) 17 g packet Take 17 g by mouth daily as needed.     prochlorperazine (COMPAZINE) 10 MG tablet Take 1 tablet (10 mg total) by mouth every 6 (six) hours as needed for nausea or vomiting. 30 tablet 0   rosuvastatin (CRESTOR) 20 MG tablet Take 20 mg by mouth daily.     vardenafil (LEVITRA) 20 MG tablet TAKE 1 TABLET BY MOUTH AS NEEDED FOR  ERECTILE  DYSFUNCTION 10 tablet 5   No current facility-administered medications for this visit.     SURGICAL HISTORY:  Past Surgical History:  Procedure Laterality Date   BRONCHIAL BIOPSY  03/30/2020   Procedure: BRONCHIAL BIOPSIES;  Surgeon: Garner Nash, DO;  Location: [facility];  Service: Pulmonary;;   BRONCHIAL BRUSHINGS  03/30/2020   Procedure: BRONCHIAL BRUSHINGS;  Surgeon: Garner Nash, DO;  Location: [facility];  Service: Pulmonary;;   BRONCHIAL NEEDLE ASPIRATION BIOPSY  03/30/2020   Procedure: BRONCHIAL NEEDLE ASPIRATION BIOPSIES;  Surgeon: Garner Nash, DO;  Location: [facility];  Service: Pulmonary;;   BRONCHIAL WASHINGS  03/30/2020   Procedure: BRONCHIAL WASHINGS;  Surgeon: Garner Nash, DO;  Location: [facility];  Service: Pulmonary;;   COLONOSCOPY  2007   IR IMAGING GUIDED PORT INSERTION  04/23/2020   neck fusion  2012   C4   NO PAST SURGERIES     VIDEO BRONCHOSCOPY WITH ENDOBRONCHIAL NAVIGATION N/A 03/30/2020   Procedure: VIDEO BRONCHOSCOPY WITH ENDOBRONCHIAL NAVIGATION;  Surgeon: Garner Nash, DO;  Location: [facility];  Service: Pulmonary;  Laterality: N/A;   VIDEO BRONCHOSCOPY WITH ENDOBRONCHIAL ULTRASOUND N/A 03/30/2020   Procedure: VIDEO BRONCHOSCOPY WITH ENDOBRONCHIAL ULTRASOUND;  Surgeon: Garner Nash, DO;  Location: [facility];  Service: Pulmonary;  Laterality: N/A;    Constitutional: Negative for appetite change, chills, fatigue, fever and unexpected weight change.  HENT:  Negative for mouth sores, nosebleeds, sore throat and trouble swallowing.   Eyes: Negative for eye problems and icterus.  Respiratory: Negative for cough, hemoptysis, shortness of breath and wheezing.   Cardiovascular: Negative for chest pain and leg swelling.  Gastrointestinal: Positive for occassional constipation. Negative for abdominal pain, diarrhea, nausea and vomiting.  Genitourinary: Negative for bladder incontinence, difficulty urinating, dysuria, frequency and hematuria.   Musculoskeletal: Negative for back pain, gait problem, neck pain and  neck stiffness.  Positive for mild/minimal right upper extremity swelling.  Skin: Negative for itching and rash.  Neurological: Negative for dizziness, extremity weakness, gait problem, headaches, light-headedness and seizures.  Hematological: Negative for adenopathy. Does not bruise/bleed easily.  Psychiatric/Behavioral: Negative for confusion, depression and sleep disturbance. The patient is not nervous/anxious.     PHYSICAL EXAMINATION:  Blood pressure 127/68, pulse 76, temperature 98 F (36.7 C), temperature source Oral, resp. rate 16, height 6' (1.829 m), weight 173 lb 1.6 oz (78.5 kg), SpO2 100 %.  ECOG PERFORMANCE STATUS: 1  Physical Exam  Constitutional: Oriented to person, place, and time and well-developed, well-nourished, and in no distress. HENT:  Head: Normocephalic and atraumatic.  Mouth/Throat: Oropharynx is clear and moist. No oropharyngeal exudate.  Eyes: Conjunctivae are normal. Right eye exhibits no discharge. Left eye exhibits no discharge. No scleral icterus.  Neck: Normal range of motion. Neck supple.  Cardiovascular: Normal rate, regular rhythm, normal heart sounds and intact distal pulses.   Pulmonary/Chest: Effort normal and breath sounds normal. No respiratory distress. No wheezes. No rales.  Abdominal: Soft. Bowel sounds  are normal. Exhibits no distension and no mass. There is no tenderness.  Musculoskeletal: Normal range of motion. Exhibits no edema.   Lymphadenopathy:    No cervical adenopathy.  Neurological: Alert and oriented to person, place, and time. Exhibits normal muscle tone. Gait normal. Coordination normal.  Skin: Skin is warm and dry. No rash noted. Not diaphoretic. No erythema. No pallor.  Psychiatric: Mood, memory and judgment normal.  Vitals reviewed.  LABORATORY DATA: Lab Results  Component Value Date   WBC 5.1 12/24/2021   HGB 11.0 (L) 12/24/2021   HCT 31.7 (L) 12/24/2021   MCV 84.3 12/24/2021   PLT 172 12/24/2021      Chemistry       Component Value Date/Time   NA 136 12/24/2021 1030   K 3.6 12/24/2021 1030   CL 105 12/24/2021 1030   CO2 27 12/24/2021 1030   BUN 14 12/24/2021 1030   CREATININE 1.25 (H) 12/24/2021 1030   CREATININE 0.88 02/21/2020 1115      Component Value Date/Time   CALCIUM 9.1 12/24/2021 1030   ALKPHOS 46 12/24/2021 1030   AST 27 12/24/2021 1030   ALT 20 12/24/2021 1030   BILITOT 0.4 12/24/2021 1030       RADIOGRAPHIC STUDIES:  CT Abdomen Pelvis W Contrast  Result Date: 12/06/2021 CLINICAL DATA:  Stage IV non-small cell right lung cancer. Restaging. Ongoing chemoimmunotherapy. * Tracking Code: BO * EXAM: CT ABDOMEN AND PELVIS WITH CONTRAST TECHNIQUE: Multidetector CT imaging of the abdomen and pelvis was performed using the standard protocol following bolus administration of intravenous contrast. RADIATION DOSE REDUCTION: This exam was performed according to the departmental dose-optimization program which includes automated exposure control, adjustment of the mA and/or kV according to patient size and/or use of iterative reconstruction technique. CONTRAST:  118mL OMNIPAQUE IOHEXOL 300 MG/ML  SOLN COMPARISON:  09/06/2021 CT chest, abdomen/pelvis. FINDINGS: Lower chest: Part solid peripheral left lower lobe 3.4 cm lung mass (series 4/image 13), not appreciably changed from 09/06/2021 CT. Trace dependent bilateral pleural effusions are new, left greater than right. Hepatobiliary: Normal liver size. Subcentimeter hypodense lateral segment left liver lesion (series 2/image 11) is stable and too small to characterize. No new liver lesions. Normal gallbladder with no radiopaque cholelithiasis. No biliary ductal dilatation. Pancreas: Normal, with no mass or duct dilation. Spleen: Normal size. No mass. Adrenals/Urinary Tract: Normal adrenals. Normal kidneys with no hydronephrosis and no renal mass. Chronic mild diffuse bladder wall thickening. Stomach/Bowel: Normal non-distended stomach. Normal caliber  small bowel with no small bowel wall thickening. Normal appendix. Oral contrast transits to the colon. Moderate diffuse colonic stool. No large bowel wall thickening, diverticulosis or significant pericolonic fat stranding. Vascular/Lymphatic: Atherosclerotic nonaneurysmal abdominal aorta. Patent portal, splenic, hepatic and renal veins. No pathologically enlarged lymph nodes in the abdomen or pelvis. Reproductive: Normal size prostate. Other: No pneumoperitoneum, ascites or focal fluid collection. Musculoskeletal: No aggressive appearing focal osseous lesions. Mild thoracolumbar spondylosis. IMPRESSION: 1. No evidence of metastatic disease in the abdomen or pelvis. 2. Part solid peripheral left lower lobe 3.4 cm lung mass is not appreciably changed from 09/06/2021 CT. 3. Trace dependent bilateral pleural effusions, new, left greater than right. 4. Moderate diffuse colonic stool, suggesting constipation. 5. Chronic mild diffuse bladder wall thickening, nonspecific. 6. Aortic Atherosclerosis (ICD10-I70.0). Electronically Signed   By: Ilona Sorrel M.D.   On: 12/06/2021 09:31     ASSESSMENT/PLAN:  This is a very pleasant 66 year old African-American male diagnosed with stage IV (T3, N2, M1C) non-small cell lung cancer,  adenocarcinoma.  He presented with a right upper lobe lung mass in addition to a right lower lobe, left lower lobe lung lesions with right hilar and mediastinal lymphadenopathy.  He also had metastatic disease to the brain.  He was diagnosed in October 2022.  His molecular studies by [employer] did not show any actionable mutations.   The patient underwent SRS treatment to the metastatic brain lesions under the care of Dr. Lisbeth Renshaw.  This was completed on 04/20/2021.   The patient is currently undergoing systemic chemotherapy/immunotherapy.  He started with carboplatin for an AUC of 5, Alimta 500 mg per metered squared, Keytruda 200 mg IV every 3 weeks.  He is status post 29 cycles.  Starting from  cycle #5, the patient has been on maintenance treatment with Alimta and Keytruda IV every 3 weeks. Starting from cycle #22, the patient has been on single agent alimta. [name] has been on hold due to CT findings of suspicious immunotherapy mediated enteritis. [name] was resumed with cycle #24 and he has been tolerating it well.    The patient was supposed to have a restaging CT scan prior to his appointment on 12/03/2021.  His scan was not performed until 12/05/2021 and only included the CT of the abdomen and not the order for the CT of the chest.  Dr. Julien Nordmann personally independently reviewed the abdomen and pelvis CT scan and discussed results with the patient today.  The scan showed no evidence for disease progression.  The patient was given the number to the imaging department to reschedule his CT scan the chest.  In the meantime, the patient was seen with Dr. Julien Nordmann.  Labs were reviewed.  Recommend that he proceed with cycle #30 today scheduled.  We will see him back for follow-up visit in 3 weeks for evaluation and repeat blood work before starting cycle #31.  The patient was advised to call immediately if he has any concerning symptoms in the interval. The patient voices understanding of current disease status and treatment options and is in agreement with the current care plan. All questions were answered. The patient knows to call the clinic with any problems, questions or concerns. We can certainly see the patient much sooner if necessary  No orders of the defined types were placed in this encounter.    Breasia Karges L Tiaunna Buford, PA-C 12/24/21  ADDENDUM: Hematology/Oncology Attending: I had a face-to-face encounter with the patient today.  I reviewed his record, lab, scan and recommended his care plan.  This is a very pleasant 66 years old African-American male with a stage IV non-small cell lung cancer, adenocarcinoma with no actionable mutations diagnosed in October 2021 and  currently on systemic chemotherapy with maintenance Alimta and Keytruda every 3 weeks after induction treatment with 4 cycles of carboplatin, Alimta and Keytruda.  The patient is status post a total of 29 cycles.  He has been tolerating this treatment well with no concerning adverse effects. The patient was supposed to have repeat CT scan of the chest, abdomen and pelvis before this visit but for some reason the radiology department did not perform the scan of the chest and only that the CT of the abdomen and pelvis. I reviewed the scan results with the patient today and there is no concerning finding for disease progression in the abdomen and pelvis.  I recommended for the patient to call the radiology department to schedule his CT scan of the chest to be done as soon as possible. He will proceed  with cycle #30 of his treatment today as planned. We will see the patient back for follow-up visit in 3 weeks for evaluation before the next cycle of his treatment. He was advised to call immediately if he has any other concerning symptoms in the interval. The total time spent in the appointment was 30 minutes. Disclaimer: This note was dictated with voice recognition software. Similar sounding words can inadvertently be transcribed and may be missed upon review. Eilleen Kempf, MD

## 2021-12-21 ENCOUNTER — Other Ambulatory Visit: Payer: Self-pay | Admitting: Internal Medicine

## 2021-12-22 NOTE — Telephone Encounter (Signed)
Please refill as per office routine med refill policy (all routine meds to be refilled for 3 mo or monthly (per pt preference) up to one year from last visit, then month to month grace period for 3 mo, then further med refills will have to be denied) ? ?

## 2021-12-24 ENCOUNTER — Inpatient Hospital Stay: Payer: 59

## 2021-12-24 ENCOUNTER — Other Ambulatory Visit: Payer: Self-pay

## 2021-12-24 ENCOUNTER — Inpatient Hospital Stay: Payer: 59 | Attending: Internal Medicine | Admitting: Physician Assistant

## 2021-12-24 VITALS — BP 127/68 | HR 76 | Temp 98.0°F | Resp 16 | Ht 72.0 in | Wt 173.1 lb

## 2021-12-24 DIAGNOSIS — Z5111 Encounter for antineoplastic chemotherapy: Secondary | ICD-10-CM | POA: Insufficient documentation

## 2021-12-24 DIAGNOSIS — C3491 Malignant neoplasm of unspecified part of right bronchus or lung: Secondary | ICD-10-CM

## 2021-12-24 DIAGNOSIS — Z5112 Encounter for antineoplastic immunotherapy: Secondary | ICD-10-CM | POA: Insufficient documentation

## 2021-12-24 DIAGNOSIS — Z79899 Other long term (current) drug therapy: Secondary | ICD-10-CM | POA: Insufficient documentation

## 2021-12-24 DIAGNOSIS — C3411 Malignant neoplasm of upper lobe, right bronchus or lung: Secondary | ICD-10-CM | POA: Insufficient documentation

## 2021-12-24 DIAGNOSIS — C3431 Malignant neoplasm of lower lobe, right bronchus or lung: Secondary | ICD-10-CM | POA: Diagnosis not present

## 2021-12-24 DIAGNOSIS — C7931 Secondary malignant neoplasm of brain: Secondary | ICD-10-CM | POA: Insufficient documentation

## 2021-12-24 DIAGNOSIS — Z95828 Presence of other vascular implants and grafts: Secondary | ICD-10-CM

## 2021-12-24 LAB — CMP (CANCER CENTER ONLY)
ALT: 20 U/L (ref 0–44)
AST: 27 U/L (ref 15–41)
Albumin: 3.6 g/dL (ref 3.5–5.0)
Alkaline Phosphatase: 46 U/L (ref 38–126)
Anion gap: 4 — ABNORMAL LOW (ref 5–15)
BUN: 14 mg/dL (ref 8–23)
CO2: 27 mmol/L (ref 22–32)
Calcium: 9.1 mg/dL (ref 8.9–10.3)
Chloride: 105 mmol/L (ref 98–111)
Creatinine: 1.25 mg/dL — ABNORMAL HIGH (ref 0.61–1.24)
GFR, Estimated: >60 mL/min (ref >60)
Glucose, Bld: 143 mg/dL — ABNORMAL HIGH (ref 70–99)
Potassium: 3.6 mmol/L (ref 3.5–5.1)
Sodium: 136 mmol/L (ref 135–145)
Total Bilirubin: 0.4 mg/dL (ref 0.3–1.2)
Total Protein: 6.8 g/dL (ref 6.5–8.1)

## 2021-12-24 LAB — CBC WITH DIFFERENTIAL (CANCER CENTER ONLY)
Abs Immature Granulocytes: 0.01 10*3/uL (ref 0.00–0.07)
Basophils Absolute: 0 10*3/uL (ref 0.0–0.1)
Basophils Relative: 1 %
Eosinophils Absolute: 0.2 10*3/uL (ref 0.0–0.5)
Eosinophils Relative: 4 %
HCT: 31.7 % — ABNORMAL LOW (ref 39.0–52.0)
Hemoglobin: 11 g/dL — ABNORMAL LOW (ref 13.0–17.0)
Immature Granulocytes: 0 %
Lymphocytes Relative: 39 %
Lymphs Abs: 2 10*3/uL (ref 0.7–4.0)
MCH: 29.3 pg (ref 26.0–34.0)
MCHC: 34.7 g/dL (ref 30.0–36.0)
MCV: 84.3 fL (ref 80.0–100.0)
Monocytes Absolute: 0.6 10*3/uL (ref 0.1–1.0)
Monocytes Relative: 12 %
Neutro Abs: 2.2 10*3/uL (ref 1.7–7.7)
Neutrophils Relative %: 44 %
Platelet Count: 172 10*3/uL (ref 150–400)
RBC: 3.76 MIL/uL — ABNORMAL LOW (ref 4.22–5.81)
RDW: 16 % — ABNORMAL HIGH (ref 11.5–15.5)
WBC Count: 5.1 10*3/uL (ref 4.0–10.5)
nRBC: 0 % (ref 0.0–0.2)

## 2021-12-24 LAB — TSH: TSH: 2.518 u[IU]/mL (ref 0.350–4.500)

## 2021-12-24 MED ORDER — CYANOCOBALAMIN 1000 MCG/ML IJ SOLN
1000.0000 ug | Freq: Once | INTRAMUSCULAR | Status: AC
  Administered 2021-12-24: 1000 ug via INTRAMUSCULAR
  Filled 2021-12-24: qty 1

## 2021-12-24 MED ORDER — SODIUM CHLORIDE 0.9% FLUSH
10.0000 mL | Freq: Once | INTRAVENOUS | Status: AC
  Administered 2021-12-24: 10 mL

## 2021-12-24 MED ORDER — SODIUM CHLORIDE 0.9 % IV SOLN
500.0000 mg/m2 | Freq: Once | INTRAVENOUS | Status: AC
  Administered 2021-12-24: 1000 mg via INTRAVENOUS
  Filled 2021-12-24: qty 40

## 2021-12-24 MED ORDER — SODIUM CHLORIDE 0.9% FLUSH
10.0000 mL | INTRAVENOUS | Status: DC | PRN
  Administered 2021-12-24: 10 mL

## 2021-12-24 MED ORDER — HEPARIN SOD (PORK) LOCK FLUSH 100 UNIT/ML IV SOLN
500.0000 [IU] | Freq: Once | INTRAVENOUS | Status: AC | PRN
  Administered 2021-12-24: 500 [IU]

## 2021-12-24 MED ORDER — PROCHLORPERAZINE MALEATE 10 MG PO TABS
10.0000 mg | ORAL_TABLET | Freq: Once | ORAL | Status: AC
  Administered 2021-12-24: 10 mg via ORAL
  Filled 2021-12-24: qty 1

## 2021-12-24 MED ORDER — SODIUM CHLORIDE 0.9 % IV SOLN
200.0000 mg | Freq: Once | INTRAVENOUS | Status: AC
  Administered 2021-12-24: 200 mg via INTRAVENOUS
  Filled 2021-12-24: qty 200

## 2021-12-24 MED ORDER — SODIUM CHLORIDE 0.9 % IV SOLN: Freq: Once | INTRAVENOUS | Status: AC

## 2021-12-24 NOTE — Patient Instructions (Signed)
[facility]   Discharge Instructions: Thank you for choosing [facility] to provide your oncology and hematology care.   If you have a lab appointment with the [facility], please go directly to the [facility] and check in at the registration area.   Wear comfortable clothing and clothing appropriate for easy access to any Portacath or PICC line.   We strive to give you quality time with your provider. You may need to reschedule your appointment if you arrive late (15 or more minutes).  Arriving late affects you and other patients whose appointments are after yours.  Also, if you miss three or more appointments without notifying the office, you may be dismissed from the clinic at the provider's discretion.      For prescription refill requests, have your pharmacy contact our office and allow 72 hours for refills to be completed.    Today you received the following chemotherapy and/or immunotherapy agents: pembrolizumab and pemetrexed      To help prevent nausea and vomiting after your treatment, we encourage you to take your nausea medication as directed.  BELOW ARE SYMPTOMS THAT SHOULD BE REPORTED IMMEDIATELY: *FEVER GREATER THAN 100.4 F (38 C) OR HIGHER *CHILLS OR SWEATING *NAUSEA AND VOMITING THAT IS NOT CONTROLLED WITH YOUR NAUSEA MEDICATION *UNUSUAL SHORTNESS OF BREATH *UNUSUAL BRUISING OR BLEEDING *URINARY PROBLEMS (pain or burning when urinating, or frequent urination) *BOWEL PROBLEMS (unusual diarrhea, constipation, pain near the anus) TENDERNESS IN MOUTH AND THROAT WITH OR WITHOUT PRESENCE OF ULCERS (sore throat, sores in mouth, or a toothache) UNUSUAL RASH, SWELLING OR PAIN  UNUSUAL VAGINAL DISCHARGE OR ITCHING   Items with * indicate a potential emergency and should be followed up as soon as possible or go to the Emergency Department if any problems should occur.  Please show the CHEMOTHERAPY ALERT CARD or IMMUNOTHERAPY ALERT  CARD at check-in to the Emergency Department and triage nurse.  Should you have questions after your visit or need to cancel or reschedule your appointment, please contact [facility]  Dept: [phone]  and follow the prompts.  Office hours are 8:00 a.m. to 4:30 p.m. Monday - Friday. Please note that voicemails left after 4:00 p.m. may not be returned until the following business day.  We are closed weekends and major holidays. You have access to a nurse at all times for urgent questions. Please call the main number to the clinic Dept: [phone] and follow the prompts.   For any non-urgent questions, you may also contact your provider using MyChart. We now offer e-Visits for anyone 18 and older to request care online for non-urgent symptoms. For details visit [email or web address].   Also download the MyChart app! Go to the app store, search "MyChart", open the app, select [facility], and log in with your MyChart username and password.  Masks are optional in the cancer centers. If you would like for your care team to wear a mask while they are taking care of you, please let them know. For doctor visits, patients may have with them one support person who is at least 65 years old. At this time, visitors are not allowed in the infusion area.

## 2021-12-31 ENCOUNTER — Ambulatory Visit (HOSPITAL_COMMUNITY)
Admission: RE | Admit: 2021-12-31 | Discharge: 2021-12-31 | Disposition: A | Discharge status: Home / Self Care | Payer: 59 | Source: Ambulatory Visit | Attending: Physician Assistant | Admitting: Physician Assistant

## 2021-12-31 DIAGNOSIS — C3491 Malignant neoplasm of unspecified part of right bronchus or lung: Secondary | ICD-10-CM | POA: Diagnosis present

## 2021-12-31 MED ORDER — HEPARIN SOD (PORK) LOCK FLUSH 100 UNIT/ML IV SOLN
500.0000 [IU] | Freq: Once | INTRAVENOUS | Status: AC
Start: 2021-12-31 — End: 2021-12-31
  Administered 2021-12-31: 500 [IU] via INTRAVENOUS

## 2021-12-31 MED ORDER — IOHEXOL 300 MG/ML  SOLN
75.0000 mL | Freq: Once | INTRAMUSCULAR | Status: AC | PRN
  Administered 2021-12-31: 75 mL via INTRAVENOUS

## 2021-12-31 MED ORDER — HEPARIN SOD (PORK) LOCK FLUSH 100 UNIT/ML IV SOLN
INTRAVENOUS | Status: AC
  Filled 2021-12-31: qty 5

## 2022-01-01 ENCOUNTER — Telehealth: Payer: Self-pay | Admitting: Physician Assistant

## 2022-01-01 NOTE — Telephone Encounter (Signed)
I told the patient at his last appointment I would call him with his scan results since his next appointment is not for another 2 weeks. I was calling to let him know the scan was stable but we will review in more depth at his next appointment. His voicemail is not set up. Unable to leave a message.

## 2022-01-06 ENCOUNTER — Other Ambulatory Visit: Payer: Self-pay

## 2022-01-10 ENCOUNTER — Other Ambulatory Visit: Payer: Self-pay

## 2022-01-13 ENCOUNTER — Other Ambulatory Visit: Payer: Self-pay

## 2022-01-13 DIAGNOSIS — C7931 Secondary malignant neoplasm of brain: Secondary | ICD-10-CM

## 2022-01-14 ENCOUNTER — Other Ambulatory Visit: Payer: Self-pay

## 2022-01-14 ENCOUNTER — Inpatient Hospital Stay: Payer: 59

## 2022-01-14 ENCOUNTER — Inpatient Hospital Stay (HOSPITAL_BASED_OUTPATIENT_CLINIC_OR_DEPARTMENT_OTHER): Payer: 59 | Admitting: Internal Medicine

## 2022-01-14 ENCOUNTER — Encounter: Payer: Self-pay | Admitting: Internal Medicine

## 2022-01-14 VITALS — BP 118/57 | HR 76 | Temp 98.1°F | Resp 15 | Wt 174.9 lb

## 2022-01-14 DIAGNOSIS — Z5112 Encounter for antineoplastic immunotherapy: Secondary | ICD-10-CM

## 2022-01-14 DIAGNOSIS — Z5111 Encounter for antineoplastic chemotherapy: Secondary | ICD-10-CM | POA: Insufficient documentation

## 2022-01-14 DIAGNOSIS — Z87891 Personal history of nicotine dependence: Secondary | ICD-10-CM | POA: Insufficient documentation

## 2022-01-14 DIAGNOSIS — Z95828 Presence of other vascular implants and grafts: Secondary | ICD-10-CM

## 2022-01-14 DIAGNOSIS — Z79899 Other long term (current) drug therapy: Secondary | ICD-10-CM | POA: Insufficient documentation

## 2022-01-14 DIAGNOSIS — Z510 Encounter for antineoplastic radiation therapy: Secondary | ICD-10-CM | POA: Insufficient documentation

## 2022-01-14 DIAGNOSIS — C3411 Malignant neoplasm of upper lobe, right bronchus or lung: Secondary | ICD-10-CM | POA: Insufficient documentation

## 2022-01-14 DIAGNOSIS — C7931 Secondary malignant neoplasm of brain: Secondary | ICD-10-CM

## 2022-01-14 DIAGNOSIS — C3491 Malignant neoplasm of unspecified part of right bronchus or lung: Secondary | ICD-10-CM

## 2022-01-14 DIAGNOSIS — C3431 Malignant neoplasm of lower lobe, right bronchus or lung: Secondary | ICD-10-CM | POA: Insufficient documentation

## 2022-01-14 LAB — CBC WITH DIFFERENTIAL (CANCER CENTER ONLY)
Abs Immature Granulocytes: 0.02 10*3/uL (ref 0.00–0.07)
Basophils Absolute: 0 10*3/uL (ref 0.0–0.1)
Basophils Relative: 1 %
Eosinophils Absolute: 0.2 10*3/uL (ref 0.0–0.5)
Eosinophils Relative: 4 %
HCT: 30.6 % — ABNORMAL LOW (ref 39.0–52.0)
Hemoglobin: 10.6 g/dL — ABNORMAL LOW (ref 13.0–17.0)
Immature Granulocytes: 1 %
Lymphocytes Relative: 37 %
Lymphs Abs: 1.6 10*3/uL (ref 0.7–4.0)
MCH: 29.1 pg (ref 26.0–34.0)
MCHC: 34.6 g/dL (ref 30.0–36.0)
MCV: 84.1 fL (ref 80.0–100.0)
Monocytes Absolute: 0.5 10*3/uL (ref 0.1–1.0)
Monocytes Relative: 11 %
Neutro Abs: 2 10*3/uL (ref 1.7–7.7)
Neutrophils Relative %: 46 %
Platelet Count: 156 10*3/uL (ref 150–400)
RBC: 3.64 MIL/uL — ABNORMAL LOW (ref 4.22–5.81)
RDW: 16.2 % — ABNORMAL HIGH (ref 11.5–15.5)
WBC Count: 4.3 10*3/uL (ref 4.0–10.5)
nRBC: 0 % (ref 0.0–0.2)

## 2022-01-14 LAB — CMP (CANCER CENTER ONLY)
ALT: 19 U/L (ref 0–44)
AST: 28 U/L (ref 15–41)
Albumin: 3.5 g/dL (ref 3.5–5.0)
Alkaline Phosphatase: 43 U/L (ref 38–126)
Anion gap: 6 (ref 5–15)
BUN: 13 mg/dL (ref 8–23)
CO2: 26 mmol/L (ref 22–32)
Calcium: 8.6 mg/dL — ABNORMAL LOW (ref 8.9–10.3)
Chloride: 106 mmol/L (ref 98–111)
Creatinine: 1.18 mg/dL (ref 0.61–1.24)
GFR, Estimated: >60 mL/min (ref >60)
Glucose, Bld: 170 mg/dL — ABNORMAL HIGH (ref 70–99)
Potassium: 3.5 mmol/L (ref 3.5–5.1)
Sodium: 138 mmol/L (ref 135–145)
Total Bilirubin: 0.3 mg/dL (ref 0.3–1.2)
Total Protein: 6.7 g/dL (ref 6.5–8.1)

## 2022-01-14 LAB — TSH: TSH: 2.17 u[IU]/mL (ref 0.350–4.500)

## 2022-01-14 MED ORDER — SODIUM CHLORIDE 0.9 % IV SOLN
500.0000 mg/m2 | Freq: Once | INTRAVENOUS | Status: AC
  Administered 2022-01-14: 1000 mg via INTRAVENOUS
  Filled 2022-01-14: qty 40

## 2022-01-14 MED ORDER — SODIUM CHLORIDE 0.9 % IV SOLN
200.0000 mg | Freq: Once | INTRAVENOUS | Status: AC
  Administered 2022-01-14: 200 mg via INTRAVENOUS
  Filled 2022-01-14: qty 200

## 2022-01-14 MED ORDER — SODIUM CHLORIDE 0.9% FLUSH
10.0000 mL | Freq: Once | INTRAVENOUS | Status: AC
  Administered 2022-01-14: 10 mL

## 2022-01-14 MED ORDER — SODIUM CHLORIDE 0.9 % IV SOLN: Freq: Once | INTRAVENOUS | Status: AC

## 2022-01-14 MED ORDER — SODIUM CHLORIDE 0.9% FLUSH
10.0000 mL | INTRAVENOUS | Status: DC | PRN
  Administered 2022-01-14: 10 mL

## 2022-01-14 MED ORDER — HEPARIN SOD (PORK) LOCK FLUSH 100 UNIT/ML IV SOLN
500.0000 [IU] | Freq: Once | INTRAVENOUS | Status: AC | PRN
  Administered 2022-01-14: 500 [IU]

## 2022-01-14 MED ORDER — PROCHLORPERAZINE MALEATE 10 MG PO TABS
10.0000 mg | ORAL_TABLET | Freq: Once | ORAL | Status: AC
  Administered 2022-01-14: 10 mg via ORAL
  Filled 2022-01-14: qty 1

## 2022-01-14 NOTE — Patient Instructions (Signed)
[facility]   Discharge Instructions: Thank you for choosing [facility] to provide your oncology and hematology care.   If you have a lab appointment with the [facility], please go directly to the [facility] and check in at the registration area.   Wear comfortable clothing and clothing appropriate for easy access to any Portacath or PICC line.   We strive to give you quality time with your provider. You may need to reschedule your appointment if you arrive late (15 or more minutes).  Arriving late affects you and other patients whose appointments are after yours.  Also, if you miss three or more appointments without notifying the office, you may be dismissed from the clinic at the provider's discretion.      For prescription refill requests, have your pharmacy contact our office and allow 72 hours for refills to be completed.    Today you received the following chemotherapy and/or immunotherapy agents: pembrolizumab and pemetrexed      To help prevent nausea and vomiting after your treatment, we encourage you to take your nausea medication as directed.  BELOW ARE SYMPTOMS THAT SHOULD BE REPORTED IMMEDIATELY: *FEVER GREATER THAN 100.4 F (38 C) OR HIGHER *CHILLS OR SWEATING *NAUSEA AND VOMITING THAT IS NOT CONTROLLED WITH YOUR NAUSEA MEDICATION *UNUSUAL SHORTNESS OF BREATH *UNUSUAL BRUISING OR BLEEDING *URINARY PROBLEMS (pain or burning when urinating, or frequent urination) *BOWEL PROBLEMS (unusual diarrhea, constipation, pain near the anus) TENDERNESS IN MOUTH AND THROAT WITH OR WITHOUT PRESENCE OF ULCERS (sore throat, sores in mouth, or a toothache) UNUSUAL RASH, SWELLING OR PAIN  UNUSUAL VAGINAL DISCHARGE OR ITCHING   Items with * indicate a potential emergency and should be followed up as soon as possible or go to the Emergency Department if any problems should occur.  Please show the CHEMOTHERAPY ALERT CARD or IMMUNOTHERAPY ALERT  CARD at check-in to the Emergency Department and triage nurse.  Should you have questions after your visit or need to cancel or reschedule your appointment, please contact [facility]  Dept: [phone]  and follow the prompts.  Office hours are 8:00 a.m. to 4:30 p.m. Monday - Friday. Please note that voicemails left after 4:00 p.m. may not be returned until the following business day.  We are closed weekends and major holidays. You have access to a nurse at all times for urgent questions. Please call the main number to the clinic Dept: [phone] and follow the prompts.   For any non-urgent questions, you may also contact your provider using MyChart. We now offer e-Visits for anyone 20 and older to request care online for non-urgent symptoms. For details visit [email or web address].   Also download the MyChart app! Go to the app store, search "MyChart", open the app, select [facility], and log in with your MyChart username and password.  Masks are optional in the cancer centers. If you would like for your care team to wear a mask while they are taking care of you, please let them know. For doctor visits, patients may have with them one support Kathi Dohn who is at least 65 years old. At this time, visitors are not allowed in the infusion area.

## 2022-01-14 NOTE — Progress Notes (Signed)
[facility] [phone]   [phone]  OFFICE PROGRESS NOTE  [name], MD [place] [id number]  DIAGNOSIS: Stage IV (T3, N2, M1c) non-small cell lung cancer favoring adenocarcinoma presented with right upper lobe lung mass in addition to right lower lobe, left lower lobe in addition to right hilar and mediastinal lymphadenopathy in addition to metastatic brain lesions diagnosed in October 2021.  Molecular studies by [employer]:  [id number], 9.5%,   PRIOR THERAPY: SRS to 3 brain lesion under the care of Dr. Lisbeth Renshaw.  CURRENT THERAPY: Systemic chemotherapy with carboplatin for AUC of 5, Alimta 500 mg/M2 and Keytruda 200 mg IV every 3 weeks.  First dose April 24, 2020.  Status post 30 cycles.  Starting from cycle #5 the patient will be on maintenance treatment with Alimta and Keytruda every 3 weeks.  INTERVAL HISTORY: Nicolas Allen 66 y.o. male returns to the clinic today for follow-up visit.  The patient has no complaints today except for mild right-sided abdominal pain started earlier today.  He just ate spicy sandwich earlier today.  He denied having any current chest pain, shortness of breath, cough or hemoptysis.  He denied having any fever or chills.  He has no nausea, vomiting, diarrhea or constipation.  He has no headache or visual changes.  He denied having any significant weight loss or night sweats.  He is here today for evaluation before starting cycle #31 of his treatment.  MEDICAL HISTORY: Past Medical History:  Diagnosis Date   Anemia 06/24/2012   ANXIETY 02/03/2007   Cervical disc disease 02/12/2012   S/p surgury jan 2013   Erectile dysfunction 02/11/2011   GERD (gastroesophageal reflux disease)    GLUCOSE INTOLERANCE 01/04/2008   HYPERLIPIDEMIA 02/03/2007   HYPERTENSION 02/03/2007   IBS (irritable bowel syndrome)    Impaired glucose tolerance 02/11/2011   nscl ca 03/30/2020   Smoker 08/22/2014    Substance abuse ([facility]) 2001   sober for 16 yrs    ALLERGIES:  has No Known Allergies.  MEDICATIONS:  Current Outpatient Medications  Medication Sig Dispense Refill   amLODipine-benazepril (LOTREL) 10-20 MG capsule Take 1 capsule by mouth once daily 90 capsule 3   aspirin 325 MG EC tablet Take 1 tablet (325 mg total) by mouth daily. 90 tablet 99   b complex vitamins tablet Take 1 tablet by mouth daily.     cholecalciferol (VITAMIN D3) 25 MCG (1000 UNIT) tablet Take 1,000 Units by mouth daily.     folic acid (FOLVITE) 1 MG tablet Take 1 tablet (1 mg total) by mouth daily. 90 tablet 3   Garlic 2778 MG CAPS Take 1,000 mg by mouth daily.      lidocaine-prilocaine (EMLA) cream Apply to the Port-A-Cath site 30-60 minutes before chemotherapy. 30 g 0   Omega-3 Fatty Acids (FISH OIL PO) Take 1 capsule by mouth daily.      polyethylene glycol (MIRALAX / GLYCOLAX) 17 g packet Take 17 g by mouth daily as needed.     prochlorperazine (COMPAZINE) 10 MG tablet Take 1 tablet (10 mg total) by mouth every 6 (six) hours as needed for nausea or vomiting. 30 tablet 0   rosuvastatin (CRESTOR) 20 MG tablet Take 20 mg by mouth daily.     vardenafil (LEVITRA) 20 MG tablet TAKE 1 TABLET BY MOUTH AS NEEDED FOR  ERECTILE  DYSFUNCTION 10 tablet 5   No current facility-administered medications for this visit.  SURGICAL HISTORY:  Past Surgical History:  Procedure Laterality Date   BRONCHIAL BIOPSY  03/30/2020   Procedure: BRONCHIAL BIOPSIES;  Surgeon: Garner Nash, DO;  Location: [facility];  Service: Pulmonary;;   BRONCHIAL BRUSHINGS  03/30/2020   Procedure: BRONCHIAL BRUSHINGS;  Surgeon: Garner Nash, DO;  Location: [facility];  Service: Pulmonary;;   BRONCHIAL NEEDLE ASPIRATION BIOPSY  03/30/2020   Procedure: BRONCHIAL NEEDLE ASPIRATION BIOPSIES;  Surgeon: Garner Nash, DO;  Location: [facility];  Service: Pulmonary;;   BRONCHIAL WASHINGS  03/30/2020   Procedure: BRONCHIAL WASHINGS;   Surgeon: Garner Nash, DO;  Location: [facility];  Service: Pulmonary;;   COLONOSCOPY  2007   IR IMAGING GUIDED PORT INSERTION  04/23/2020   neck fusion  2012   C4   NO PAST SURGERIES     VIDEO BRONCHOSCOPY WITH ENDOBRONCHIAL NAVIGATION N/A 03/30/2020   Procedure: VIDEO BRONCHOSCOPY WITH ENDOBRONCHIAL NAVIGATION;  Surgeon: Garner Nash, DO;  Location: [facility];  Service: Pulmonary;  Laterality: N/A;   VIDEO BRONCHOSCOPY WITH ENDOBRONCHIAL ULTRASOUND N/A 03/30/2020   Procedure: VIDEO BRONCHOSCOPY WITH ENDOBRONCHIAL ULTRASOUND;  Surgeon: Garner Nash, DO;  Location: [facility];  Service: Pulmonary;  Laterality: N/A;    REVIEW OF SYSTEMS:  Constitutional: negative Eyes: negative Ears, nose, mouth, throat, and face: negative Respiratory: negative Cardiovascular: negative Gastrointestinal: positive for abdominal pain Genitourinary:negative Integument/breast: negative Hematologic/lymphatic: negative Musculoskeletal:negative Neurological: negative Behavioral/Psych: negative Endocrine: negative Allergic/Immunologic: negative   PHYSICAL EXAMINATION: General appearance: alert, cooperative, and no distress Head: Normocephalic, without obvious abnormality, atraumatic Neck: no adenopathy, no JVD, supple, symmetrical, trachea midline, and thyroid not enlarged, symmetric, no tenderness/mass/nodules Lymph nodes: Cervical, supraclavicular, and axillary nodes normal. Resp: clear to auscultation bilaterally Back: symmetric, no curvature. ROM normal. No CVA tenderness. Cardio: regular rate and rhythm, S1, S2 normal, no murmur, click, rub or gallop GI: soft, non-tender; bowel sounds normal; no masses,  no organomegaly Extremities: extremities normal, atraumatic, no cyanosis or edema Neurologic: Alert and oriented X 3, normal strength and tone. Normal symmetric reflexes. Normal coordination and gait  ECOG PERFORMANCE STATUS: 1 - Symptomatic but completely ambulatory  Blood  pressure (!) 118/57, pulse 76, temperature 98.1 F (36.7 C), temperature source Oral, resp. rate 15, weight 174 lb 14.4 oz (79.3 kg), SpO2 100 %.  LABORATORY DATA: Lab Results  Component Value Date   WBC 5.1 12/24/2021   HGB 11.0 (L) 12/24/2021   HCT 31.7 (L) 12/24/2021   MCV 84.3 12/24/2021   PLT 172 12/24/2021      Chemistry      Component Value Date/Time   NA 136 12/24/2021 1030   K 3.6 12/24/2021 1030   CL 105 12/24/2021 1030   CO2 27 12/24/2021 1030   BUN 14 12/24/2021 1030   CREATININE 1.25 (H) 12/24/2021 1030   CREATININE 0.88 02/21/2020 1115      Component Value Date/Time   CALCIUM 9.1 12/24/2021 1030   ALKPHOS 46 12/24/2021 1030   AST 27 12/24/2021 1030   ALT 20 12/24/2021 1030   BILITOT 0.4 12/24/2021 1030       RADIOGRAPHIC STUDIES: CT Chest W Contrast  Result Date: 01/01/2022 CLINICAL DATA:  Primary Cancer Type: Lung Imaging Indication: Assess response to therapy Interval therapy since last imaging? Yes Initial Cancer Diagnosis Date: 03/30/2020; Established by: Biopsy-proven Detailed Pathology: Stage IV non-small cell lung cancer favoring adenocarcinoma. Primary Tumor location:  Right upper lobe. Brain metastases. Surgeries: No. Chemotherapy: Yes; Ongoing? Yes; Most recent administration: 12/24/2021 Immunotherapy?  Yes; Type: Keytruda;  Ongoing? Yes Radiation therapy?  Yes; Date Range: 04/20/2020; Target: Brain * Tracking Code: BO * EXAM: CT CHEST WITH CONTRAST TECHNIQUE: Multidetector CT imaging of the chest was performed during intravenous contrast administration. RADIATION DOSE REDUCTION: This exam was performed according to the departmental dose-optimization program which includes automated exposure control, adjustment of the mA and/or kV according to patient size and/or use of iterative reconstruction technique. The heart size appears within normal limits. CONTRAST:  21mL OMNIPAQUE IOHEXOL 300 MG/ML  SOLN COMPARISON:  Most recent CT chest 09/06/2021. 04/02/2020  PET-CT. FINDINGS: Cardiovascular: Normal heart size. Trace amount of pericardial fluid or thickening is noted anteriorly the, unchanged. Aortic atherosclerosis. Coronary artery calcifications. Mediastinum/Nodes: Thyroid gland, trachea, and esophagus appear normal. There are no enlarged supraclavicular, axillary, mediastinal or hilar lymph nodes. Lungs/Pleura: No pleural effusion, airspace consolidation, atelectasis, or pneumothorax. Paraseptal and centrilobular emphysema. Multiple part solid lung nodules are again noted involving both lungs, including: -treated lesion within the posterior right upper lobe appears unchanged with a band of parenchymal thickening measuring 3.8 by 0.9 cm, image 66/5. Stable from previous exam. -part solid lesion within the apical segment of the right upper lobe measures 1.3 x 0.8 cm, image 31/5. Unchanged from previous study, image 31/5. -Part solid nodule within the medial right lower lobe measures 2.6 x 1.0 cm, image 65/5. Previously 2.2 x 1.0 cm. -part solid nodule in the periphery of the right lower lobe measures 2.5 by 1.6 cm, image 82/5. Stable from previous exam. -Ground-glass nodule in the right middle lobe measures 9 mm, image 76/5. Stable in the interval. -Part solid, cystic lesion within the periphery of the left lower lobe measures 3.2 by 3.1 cm with peripheral solid component measuring 1.7 by 0.6 cm, image 114/5. Formally this measured 3.4 x 2.8 cm with nodular component inferiorly measuring 1.7 x 0.6 cm. -No new or enlarging lung nodules. Upper Abdomen: No acute abnormality. Musculoskeletal: No acute or suspicious osseous findings. IMPRESSION: 1. Stable appearance of the treated lesion within the posterior right upper lobe. 2. Multiple part solid lung nodules are again noted involving both lungs. These are not significantly changed compared with previous exam. This includes the slowly enlarging nodule within the medial right lower lobe. These nodules remain morphologically  concerning for low-grade multifocal pulmonary adenocarcinoma. 3. No signs of nodal or distant metastatic disease within the chest. 4. Coronary artery calcifications. 5. Aortic Atherosclerosis (ICD10-I70.0) and Emphysema (ICD10-J43.9). Electronically Signed   By: Kerby Moors M.D.   On: 01/01/2022 15:06     ASSESSMENT AND PLAN: This is a very pleasant 66 years old African-American male recently diagnosed with stage IV (T3, N2, M1c)  non-small cell lung cancer, adenocarcinoma presented with right upper lobe lung mass in addition to right lower lobe, left lower lobe in addition to right hilar and mediastinal lymphadenopathy in addition to metastatic brain lesions diagnosed in October 2021. The patient has molecular studies by [employer] that showed no actionable mutations. He underwent SRS treatment to his brain lesion. The patient is currently undergoing systemic chemotherapy with carboplatin for AUC of 5, Alimta 500 mg/M2 and Keytruda 200 mg IV every 3 weeks status post 30 cycles.  Starting from cycle #5 the patient is on maintenance treatment with Alimta and Keytruda every 3 weeks. The patient has been tolerating this treatment fairly well with no concerning adverse effects. He had repeat CT scan of the chest performed few weeks ago.  I personally and independently reviewed the scans and discussed the result with the  patient today. His scan showed no concerning findings for disease progression and the patient continues to have stable disease except for a slowly enlarging nodule within the medial right lower lobe that need close monitoring. I recommended for him to continue his treatment with maintenance Alimta and Keytruda as previously planned. I will see the patient back for follow-up visit in 3 weeks for evaluation before the next cycle of his treatment. He was advised to call immediately if he has any other concerning symptoms in the interval. The patient voices understanding of current disease  status and treatment options and is in agreement with the current care plan.  All questions were answered. The patient knows to call the clinic with any problems, questions or concerns. We can certainly see the patient much sooner if necessary.  Disclaimer: This note was dictated with voice recognition software. Similar sounding words can inadvertently be transcribed and may not be corrected upon review.

## 2022-01-15 ENCOUNTER — Telehealth: Payer: Self-pay

## 2022-01-15 ENCOUNTER — Other Ambulatory Visit: Payer: Self-pay

## 2022-01-15 DIAGNOSIS — C7931 Secondary malignant neoplasm of brain: Secondary | ICD-10-CM

## 2022-01-15 NOTE — Progress Notes (Signed)
Port access for imaging day of MRI

## 2022-01-15 NOTE — Telephone Encounter (Signed)
Called pt number and vm has not been set up yet. Called pt wife, Beverlee Nims, telephone number and [facility] for callback to advise of 8/16 MRI brain 11:30am  arrival and 12:00pm exam. Also to advise of f/u via telephone with Bryson Ha 8/21 during the day.

## 2022-01-24 ENCOUNTER — Other Ambulatory Visit: Payer: Self-pay | Admitting: Internal Medicine

## 2022-01-24 NOTE — Telephone Encounter (Signed)
Please refill as per office routine med refill policy (all routine meds to be refilled for 3 mo or monthly (per pt preference) up to one year from last visit, then month to month grace period for 3 mo, then further med refills will have to be denied) ? ?

## 2022-01-28 NOTE — Progress Notes (Unsigned)
[facility] OFFICE PROGRESS NOTE  [name], MD [place] [address]  DIAGNOSIS: Stage IV (T3, N2, M1c) non-small cell lung cancer favoring adenocarcinoma presented with right upper lobe lung mass in addition to right lower lobe, left lower lobe in addition to right hilar and mediastinal lymphadenopathy in addition to metastatic brain lesions diagnosed in October 2021.   Molecular studies by [employer]:   [id number], 9.5%,  PRIOR THERAPY: SRS to 3 brain lesion under the care of Dr. Lisbeth Renshaw.  Completed on April 20, 2020  CURRENT THERAPY:  1) Systemic chemotherapy with carboplatin for AUC of 5, Alimta 500 mg/M2 and Keytruda 200 mg IV every 3 weeks.  First dose April 24, 2020.  Status post 31 cycles.  Starting from cycle #5 the patient will be on maintenance treatment with Alimta and Keytruda every 3 weeks. 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 This was resumed starting from cycle #24.  2) SRS to the enlarging brain lesion, under the care of Dr. Lisbeth Renshaw. Expected to be performed on 02/12/22.   INTERVAL HISTORY: Nicolas Allen 66 y.o. male returns to the clinic today for a follow-up visit accompanied by his wife.  The patient is feeling fairly well today without any concerning complaints.  He is currently undergoing treatment with alimta and [place]. 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 This was resumed from cycle #24. He tolerated his last cycle well without any concerning adverse side effects. He denies any fever, chills, night sweats, or unexplained weight loss.  Denies any nausea, diarrhea, constipation, or vomiting.  Denies any chest pain, shortness of breath, or hemoptysis.  Denies any significant cough.  Denies any headache or visual changes. He telephone visit with radiation oncology yesterday and he is expected to have his planning session for [facility] after  his infusion today.  He is expected to have treatment to the enlarging brain lesion next week on 02/12/2022.  He is here for evaluation and repeat blood work before starting cycle #32.     MEDICAL HISTORY: Past Medical History:  Diagnosis Date   Anemia 06/24/2012   ANXIETY 02/03/2007   Cervical disc disease 02/12/2012   S/p surgury jan 2013   Erectile dysfunction 02/11/2011   GERD (gastroesophageal reflux disease)    GLUCOSE INTOLERANCE 01/04/2008   HYPERLIPIDEMIA 02/03/2007   HYPERTENSION 02/03/2007   IBS (irritable bowel syndrome)    Impaired glucose tolerance 02/11/2011   nscl ca 03/30/2020   Smoker 08/22/2014   Substance abuse ([name]) 2001   sober for 16 yrs    ALLERGIES:  has No Known Allergies.  MEDICATIONS:  Current Outpatient Medications  Medication Sig Dispense Refill   amLODipine-benazepril (LOTREL) 10-20 MG capsule Take 1 capsule by mouth once daily 90 capsule 3   aspirin 325 MG EC tablet Take 1 tablet (325 mg total) by mouth daily. 90 tablet 99   b complex vitamins tablet Take 1 tablet by mouth daily.     cholecalciferol (VITAMIN D3) 25 MCG (1000 UNIT) tablet Take 1,000 Units by mouth daily.     folic acid (FOLVITE) 1 MG tablet Take 1 tablet (1 mg total) by mouth daily. 90 tablet 3   Garlic 6578 MG CAPS Take 1,000 mg by mouth daily.      Omega-3 Fatty Acids (FISH OIL PO) Take 1 capsule by mouth daily.      polyethylene glycol (MIRALAX /  GLYCOLAX) 17 g packet Take 17 g by mouth daily as needed.     prochlorperazine (COMPAZINE) 10 MG tablet Take 1 tablet (10 mg total) by mouth every 6 (six) hours as needed for nausea or vomiting. 30 tablet 0   rosuvastatin (CRESTOR) 20 MG tablet Take 1 tablet by mouth once daily 90 tablet 2   vardenafil (LEVITRA) 20 MG tablet TAKE 1 TABLET BY MOUTH AS NEEDED FOR  ERECTILE  DYSFUNCTION 10 tablet 5   lidocaine-prilocaine (EMLA) cream Apply to the Port-A-Cath site 30-60 minutes before chemotherapy. (Patient not taking: Reported on  02/04/2022) 30 g 0   No current facility-administered medications for this visit.    SURGICAL HISTORY:  Past Surgical History:  Procedure Laterality Date   BRONCHIAL BIOPSY  03/30/2020   Procedure: BRONCHIAL BIOPSIES;  Surgeon: Garner Nash, DO;  [facility];  Service: Pulmonary;;   BRONCHIAL BRUSHINGS  03/30/2020   Procedure: BRONCHIAL BRUSHINGS;  Surgeon: Garner Nash, DO;  Location: [facility];  Service: Pulmonary;;   BRONCHIAL NEEDLE ASPIRATION BIOPSY  03/30/2020   Procedure: BRONCHIAL NEEDLE ASPIRATION BIOPSIES;  Surgeon: Garner Nash, DO;  Location: [facility];  Service: Pulmonary;;   BRONCHIAL WASHINGS  03/30/2020   Procedure: BRONCHIAL WASHINGS;  Surgeon: Garner Nash, DO;  Location: [facility];  Service: Pulmonary;;   COLONOSCOPY  2007   IR IMAGING GUIDED PORT INSERTION  04/23/2020   neck fusion  2012   C4   NO PAST SURGERIES     VIDEO BRONCHOSCOPY WITH ENDOBRONCHIAL NAVIGATION N/A 03/30/2020   Procedure: VIDEO BRONCHOSCOPY WITH ENDOBRONCHIAL NAVIGATION;  Surgeon: Garner Nash, DO;  Location: [facility];  Service: Pulmonary;  Laterality: N/A;   VIDEO BRONCHOSCOPY WITH ENDOBRONCHIAL ULTRASOUND N/A 03/30/2020   Procedure: VIDEO BRONCHOSCOPY WITH ENDOBRONCHIAL ULTRASOUND;  Surgeon: Garner Nash, DO;  Location: [facility];  Service: Pulmonary;  Laterality: N/A;    REVIEW OF SYSTEMS:   Review of Systems  Constitutional: Negative for appetite change, chills, fatigue, fever and unexpected weight change.  HENT:   Negative for mouth sores, nosebleeds, sore throat and trouble swallowing.   Eyes: Negative for eye problems and icterus.  Respiratory: Negative for cough, hemoptysis, shortness of breath and wheezing.   Cardiovascular: Negative for chest pain and leg swelling.  Gastrointestinal: Negative for abdominal pain, constipation, diarrhea, nausea and vomiting.  Genitourinary: Negative for bladder incontinence, difficulty urinating,  dysuria, frequency and hematuria.   Musculoskeletal: Negative for back pain, gait problem, neck pain and neck stiffness.  Skin: Negative for itching and rash.  Neurological: Negative for dizziness, extremity weakness, gait problem, headaches, light-headedness and seizures.  Hematological: Negative for adenopathy. Does not bruise/bleed easily.  Psychiatric/Behavioral: Negative for confusion, depression and sleep disturbance. The patient is not nervous/anxious.     PHYSICAL EXAMINATION:  Blood pressure 111/62, pulse 94, temperature 98.1 F (36.7 C), temperature source Temporal, resp. rate 17, weight 171 lb 9.6 oz (77.8 kg), SpO2 100 %.  ECOG PERFORMANCE STATUS: 1  Constitutional: Oriented to person, place, and time and well-developed, well-nourished, and in no distress. HENT:  Head: Normocephalic and atraumatic.  Mouth/Throat: Oropharynx is clear and moist. No oropharyngeal exudate.  Eyes: Conjunctivae are normal. Right eye exhibits no discharge. Left eye exhibits no discharge. No scleral icterus.  Neck: Normal range of motion. Neck supple.  Cardiovascular: Normal rate, regular rhythm, normal heart sounds and intact distal pulses.   Pulmonary/Chest: Effort normal and breath sounds normal. No respiratory distress. No wheezes. No rales.  Abdominal: Soft. Bowel  sounds are normal. Exhibits no distension and no mass. There is no tenderness.  Musculoskeletal: Normal range of motion. Exhibits no edema.   Lymphadenopathy:    No cervical adenopathy.  Neurological: Alert and oriented to person, place, and time. Exhibits normal muscle tone. Gait normal. Coordination normal.  Skin: Skin is warm and dry. No rash noted. Not diaphoretic. No erythema. No pallor.  Psychiatric: Mood, memory and judgment normal.  Vitals reviewed.  LABORATORY DATA: Lab Results  Component Value Date   WBC 5.6 02/04/2022   HGB 10.8 (L) 02/04/2022   HCT 30.8 (L) 02/04/2022   MCV 83.5 02/04/2022   PLT 188 02/04/2022       Chemistry      Component Value Date/Time   NA 139 02/04/2022 0936   K 3.6 02/04/2022 0936   CL 108 02/04/2022 0936   CO2 28 02/04/2022 0936   BUN 13 02/04/2022 0936   CREATININE 1.29 (H) 02/04/2022 0936   CREATININE 0.88 02/21/2020 1115      Component Value Date/Time   CALCIUM 9.0 02/04/2022 0936   ALKPHOS 45 02/04/2022 0936   AST 28 02/04/2022 0936   ALT 18 02/04/2022 0936   BILITOT 0.3 02/04/2022 0936       RADIOGRAPHIC STUDIES:  MR Brain W Wo Contrast  Result Date: 01/31/2022 CLINICAL DATA:  66 year old male with metastatic lung cancer. Brain metastases treated with SRS in 2021. Suggestion of mild enlarging treated cerebellar vermis lesion in April. Restaging. EXAM: MRI HEAD WITHOUT AND WITH CONTRAST TECHNIQUE: Multiplanar, multiecho pulse sequences of the brain and surrounding structures were obtained without and with intravenous contrast. CONTRAST:  68mL MULTIHANCE GADOBENATE DIMEGLUMINE 529 MG/ML IV SOLN COMPARISON:  09/20/2021 and earlier. FINDINGS: Brain: Posterior midline vermis enhancing lesion again appears mildly larger since April, now 9-10 mm long axis on series 13, image 41 and series 15, image 14. This was 6-7 mm in April, and 5-6 mm with the same postcontrast on 01/31/2021. However, no associated cerebellar edema or mass effect. The lesion is isointense on T2 imaging. No other abnormal intracranial enhancement identified. No dural thickening. No restricted diffusion to suggest acute infarction. No midline shift, mass effect, ventriculomegaly, extra-axial collection or acute intracranial hemorrhage. Cervicomedullary junction and pituitary are within normal limits. Nonspecific patchy and scattered bilateral cerebral white matter T2 and FLAIR hyperintensity has not significantly changed since last year. Vascular: Major intracranial vascular flow voids are stable. Skull and upper cervical spine: Negative visible cervical spine. Normal visible bone marrow signal.  Sinuses/Orbits: Stable, negative. Other: Mastoids remain clear. Visible internal auditory structures appear normal. Continued stable roughly 2 cm chronic enhancing mass in the right parotid gland (series 2, image 5), not significantly changed since 04/07/2020. IMPRESSION: 1. Continued enlargement of the solitary residual enhancing treated metastasis in the posterior midline cerebellar vermis. This has almost doubled in long axis from one year ago, now 9-10 mm. However, there is no associated cerebellar edema or mass effect. 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 Recommend continued surveillance. 2. No new metastatic disease identified. 3. Chronic roughly 2 cm right parotid gland nodule has not significantly changed since October 2021, favoring benign primary salivary neoplasm. Electronically Signed   By: Genevie Ann M.D.   On: 01/31/2022 10:23     ASSESSMENT/PLAN:  This is a very pleasant 65 year old African-American male diagnosed with stage IV (T3, N2, M1C) non-small cell lung cancer, adenocarcinoma.  He presented with a right upper lobe lung mass in addition to a right lower lobe, left lower lobe lung  lesions with right hilar and mediastinal lymphadenopathy.  He also had metastatic disease to the brain.  He was diagnosed in October 2022.  His molecular studies by [employer] did not show any actionable mutations.   The patient underwent SRS treatment to the metastatic brain lesions under the care of Dr. Lisbeth Renshaw.  This was completed on 04/20/2021.    The patient is currently undergoing systemic chemotherapy/immunotherapy.  He started with carboplatin for an AUC of 5, Alimta 500 mg per metered squared, Keytruda 200 mg IV every 3 weeks.  He is status post 31 cycles.  Starting from cycle #5, the patient has been on maintenance treatment with Alimta and Keytruda IV every 3 weeks. Starting from cycle #22, the patient has been on single agent alimta. [name] has been on hold due to CT findings of suspicious immunotherapy  mediated enteritis. [name] was resumed with cycle #24 and he has been tolerating it well.    Labs were reviewed.  Recommend that he proceed with cycle #32 today scheduled.  We will see him back for follow-up visit in 3 weeks for evaluation and repeat blood work before starting cycle #33  He will undergo his [place] as scheduled for today 8/22 under the care of Dr. Lisbeth Renshaw. His SRS treatment is scheduled for 02/12/22.   The patient was advised to call immediately if she has any concerning symptoms in the interval. The patient voices understanding of current disease status and treatment options and is in agreement with the current care plan. All questions were answered. The patient knows to call the clinic with any problems, questions or concerns. We can certainly see the patient much sooner if necessary      Orders Placed This Encounter  Procedures   CBC with Differential ([facility] Only)    Standing Status:   Future    Standing Expiration Date:   02/05/2023   CMP ([facility] only)    Standing Status:   Future    Standing Expiration Date:   02/05/2023   CBC with Differential ([facility] Only)    Standing Status:   Future    Standing Expiration Date:   02/26/2023   CMP ([facility] only)    Standing Status:   Future    Standing Expiration Date:   02/26/2023   T4    Standing Status:   Future    Standing Expiration Date:   02/26/2023   TSH    Standing Status:   Future    Standing Expiration Date:   02/26/2023   CBC with Differential ([facility] Only)    Standing Status:   Future    Standing Expiration Date:   03/19/2023   CMP ([facility] only)    Standing Status:   Future    Standing Expiration Date:   03/19/2023   CBC with Differential ([facility] Only)    Standing Status:   Future    Standing Expiration Date:   04/09/2023   CMP ([place] [place] only)    Standing Status:   Future    Standing Expiration Date:   04/09/2023   CBC with Differential ([place] Only)    Standing Status:   Future    Standing Expiration Date:   04/30/2023   CMP ([facility] only)    Standing Status:   Future    Standing Expiration Date:   04/30/2023     The total time spent in the appointment was 20-29 minutes.   Chukwuemeka Artola L Bexley Laubach, PA-C  02/04/22  

## 2022-01-29 ENCOUNTER — Ambulatory Visit
Admission: RE | Admit: 2022-01-29 | Discharge: 2022-01-29 | Disposition: A | Discharge status: Home / Self Care | Payer: 59 | Source: Ambulatory Visit | Attending: Radiation Oncology | Admitting: Radiation Oncology

## 2022-01-29 DIAGNOSIS — C7931 Secondary malignant neoplasm of brain: Secondary | ICD-10-CM

## 2022-01-29 MED ORDER — GADOBENATE DIMEGLUMINE 529 MG/ML IV SOLN
15.0000 mL | Freq: Once | INTRAVENOUS | Status: AC | PRN
Start: 2022-01-29 — End: 2022-01-29
  Administered 2022-01-29: 15 mL via INTRAVENOUS

## 2022-01-29 MED ORDER — SODIUM CHLORIDE 0.9% FLUSH
10.0000 mL | INTRAVENOUS | Status: DC | PRN
  Administered 2022-01-29: 10 mL via INTRAVENOUS

## 2022-01-29 MED ORDER — HEPARIN SOD (PORK) LOCK FLUSH 100 UNIT/ML IV SOLN
500.0000 [IU] | Freq: Once | INTRAVENOUS | Status: AC
  Administered 2022-01-29: 500 [IU] via INTRAVENOUS

## 2022-01-30 NOTE — Progress Notes (Signed)
Radiation Oncology         [phone] ________________________________  Outpatient Follow Up - Conducted via telephone at patient request.  I spoke with the patient to conduct this consult visit via telephone. The patient was notified in advance and was offered an in person or telemedicine meeting to allow for face to face communication but instead preferred to proceed with a telephone visit.    Name: Nicolas Allen        MRN: [id number]  Date of Service: 02/03/2022 DOB: [birth year]  PP:IRJJ, Hunt Oris, MD  Curt Bears, MD     REFERRING PHYSICIAN: Curt Bears, MD   DIAGNOSIS: The primary encounter diagnosis was Malignant neoplasm metastatic to brain [facility]). A diagnosis of Non-small cell carcinoma of right lung, stage 4 (HCC) was also pertinent to this visit.   HISTORY OF PRESENT ILLNESS: Nicolas Allen is a 66 y.o. male with a history of stage IV non-small cell lung cancer, adenocarcinoma involving multifocal disease in the lung and 3 brain metastases. He was diagnosed in the fall of 2021 and found to have metastatic disease in the lung and brain for which he has received stereotactic radiosurgery [facility]) with Dr. Lisbeth Renshaw and has continued on chemo/immunotherapy with Dr. Julien Nordmann.    He has been followed in surveillance and has been without recurrent disease in the brain.  In April 2023, his MRI was discussed in  brain oncology conference and changes that have been noted were considered pseudoprogression due to his immunotherapy. He had repeat MRI of the brain on 01/29/22 that showed no new lesions, but the largest lesion he had treatment to in 2021 that has been followed as above, has increased up to 6 mm, previously 4 mm in that dimension. His case was discussed in multidisciplinary brain oncology conference and it was recommended that this be treated out of concern for progression rather than pseudoprogression.  He is contacted by phone to review these recommendations.   PREVIOUS  RADIATION THERAPY:  04/20/20 SRS Treatment: Each site below was treated to 20 Gy in 1 fraction PTV1 Rt Cerebellum 109mm PTV2Mid cerebellum 40mm PTV3Lt Parietal 12mm    PAST MEDICAL HISTORY:  Past Medical History:  Diagnosis Date   Anemia 06/24/2012   ANXIETY 02/03/2007   Cervical disc disease 02/12/2012   S/p surgury jan 2013   Erectile dysfunction 02/11/2011   GERD (gastroesophageal reflux disease)    GLUCOSE INTOLERANCE 01/04/2008   HYPERLIPIDEMIA 02/03/2007   HYPERTENSION 02/03/2007   IBS (irritable bowel syndrome)    Impaired glucose tolerance 02/11/2011   nscl ca 03/30/2020   Smoker 08/22/2014   Substance abuse ([place]) 2001   sober for 16 yrs       PAST SURGICAL HISTORY: Past Surgical History:  Procedure Laterality Date   BRONCHIAL BIOPSY  03/30/2020   Procedure: BRONCHIAL BIOPSIES;  Surgeon: Garner Nash, DO;  Location: [facility];  Service: Pulmonary;;   BRONCHIAL BRUSHINGS  03/30/2020   Procedure: BRONCHIAL BRUSHINGS;  Surgeon: Garner Nash, DO;  Location: [facility];  Service: Pulmonary;;   BRONCHIAL NEEDLE ASPIRATION BIOPSY  03/30/2020   Procedure: BRONCHIAL NEEDLE ASPIRATION BIOPSIES;  Surgeon: Garner Nash, DO;  Location: [facility];  Service: Pulmonary;;   BRONCHIAL WASHINGS  03/30/2020   Procedure: BRONCHIAL WASHINGS;  Surgeon: Garner Nash, DO;  Location: [facility];  Service: Pulmonary;;   COLONOSCOPY  2007   IR IMAGING GUIDED PORT INSERTION  04/23/2020   neck fusion  2012   C4   NO  PAST SURGERIES     VIDEO BRONCHOSCOPY WITH ENDOBRONCHIAL NAVIGATION N/A 03/30/2020   Procedure: VIDEO BRONCHOSCOPY WITH ENDOBRONCHIAL NAVIGATION;  Surgeon: Garner Nash, DO;  Location: [facility];  Service: Pulmonary;  Laterality: N/A;   VIDEO BRONCHOSCOPY WITH ENDOBRONCHIAL ULTRASOUND N/A 03/30/2020   Procedure: VIDEO BRONCHOSCOPY WITH ENDOBRONCHIAL ULTRASOUND;  Surgeon: Garner Nash, DO;  Location: [facility];  Service: Pulmonary;   Laterality: N/A;     FAMILY HISTORY:  Family History  Problem Relation Age of Onset   Cancer Mother        esophagus   Cancer Cousin    Stroke Other    Hypertension Other    Colon cancer Neg Hx      SOCIAL HISTORY:  reports that he quit smoking about 23 months ago. His smoking use included cigarettes. He has a 45.00 pack-year smoking history. He has quit using smokeless tobacco.  His smokeless tobacco use included chew. He reports that he does not drink alcohol and does not use drugs. The patient is married and resides in [place]. He enjoys fishing and throwing horseshoes and competes in local tournaments between April and October.    ALLERGIES: Patient has no known allergies.   MEDICATIONS:  Current Outpatient Medications  Medication Sig Dispense Refill   rosuvastatin (CRESTOR) 20 MG tablet Take 1 tablet by mouth once daily 90 tablet 2   amLODipine-benazepril (LOTREL) 10-20 MG capsule Take 1 capsule by mouth once daily 90 capsule 3   aspirin 325 MG EC tablet Take 1 tablet (325 mg total) by mouth daily. 90 tablet 99   b complex vitamins tablet Take 1 tablet by mouth daily.     cholecalciferol (VITAMIN D3) 25 MCG (1000 UNIT) tablet Take 1,000 Units by mouth daily.     folic acid (FOLVITE) 1 MG tablet Take 1 tablet (1 mg total) by mouth daily. 90 tablet 3   Garlic 7867 MG CAPS Take 1,000 mg by mouth daily.      lidocaine-prilocaine (EMLA) cream Apply to the Port-A-Cath site 30-60 minutes before chemotherapy. 30 g 0   Omega-3 Fatty Acids (FISH OIL PO) Take 1 capsule by mouth daily.      polyethylene glycol (MIRALAX / GLYCOLAX) 17 g packet Take 17 g by mouth daily as needed.     prochlorperazine (COMPAZINE) 10 MG tablet Take 1 tablet (10 mg total) by mouth every 6 (six) hours as needed for nausea or vomiting. 30 tablet 0   vardenafil (LEVITRA) 20 MG tablet TAKE 1 TABLET BY MOUTH AS NEEDED FOR  ERECTILE  DYSFUNCTION 10 tablet 5   No current facility-administered medications for  this visit.     REVIEW OF SYSTEMS: On review of systems, the patient reports he is doing well overall. He's not had any difficulty with his infusions per report. He denies any headaches, dizziness, nausea, vomiting, or changes in movement. No other complaints are verbalized.   PHYSICAL EXAM:  Unable to assess given encounter type.    ECOG = 0  0 - Asymptomatic (Fully active, able to carry on all predisease activities without restriction)  1 - Symptomatic but completely ambulatory (Restricted in physically strenuous activity but ambulatory and able to carry out work of a light or sedentary nature. For example, light housework, office work)  2 - Symptomatic, <50% in bed during the day (Ambulatory and capable of all self care but unable to carry out any work activities. Up and about more than 50% of waking hours)  3 - Symptomatic, >  50% in bed, but not bedbound (Capable of only limited self-care, confined to bed or chair 50% or more of waking hours)  4 - Bedbound (Completely disabled. Cannot carry on any self-care. Totally confined to bed or chair)  5 - Death   Eustace Pen MM, Creech RH, Tormey DC, et al. [phone]). "Toxicity and response criteria of the [facility]". [place] Oncol. 5 (6): 649-55    LABORATORY DATA:  Lab Results  Component Value Date   WBC 4.3 01/14/2022   HGB 10.6 (L) 01/14/2022   HCT 30.6 (L) 01/14/2022   MCV 84.1 01/14/2022   PLT 156 01/14/2022   Lab Results  Component Value Date   NA 138 01/14/2022   K 3.5 01/14/2022   CL 106 01/14/2022   CO2 26 01/14/2022   Lab Results  Component Value Date   ALT 19 01/14/2022   AST 28 01/14/2022   ALKPHOS 43 01/14/2022   BILITOT 0.3 01/14/2022      RADIOGRAPHY: CT Chest W Contrast  Result Date: 01/01/2022 CLINICAL DATA:  Primary Cancer Type: Lung Imaging Indication: Assess response to therapy Interval therapy since last imaging? Yes Initial Cancer Diagnosis Date: 03/30/2020; Established by:  Biopsy-proven Detailed Pathology: Stage IV non-small cell lung cancer favoring adenocarcinoma. Primary Tumor location:  Right upper lobe. Brain metastases. Surgeries: No. Chemotherapy: Yes; Ongoing? Yes; Most recent administration: 12/24/2021 Immunotherapy?  Yes; Type: Keytruda; Ongoing? Yes Radiation therapy?  Yes; Date Range: 04/20/2020; Target: Brain * Tracking Code: BO * EXAM: CT CHEST WITH CONTRAST TECHNIQUE: Multidetector CT imaging of the chest was performed during intravenous contrast administration. RADIATION DOSE REDUCTION: This exam was performed according to the departmental dose-optimization program which includes automated exposure control, adjustment of the mA and/or kV according to patient size and/or use of iterative reconstruction technique. The heart size appears within normal limits. CONTRAST:  64mL OMNIPAQUE IOHEXOL 300 MG/ML  SOLN COMPARISON:  Most recent CT chest 09/06/2021. 04/02/2020 PET-CT. FINDINGS: Cardiovascular: Normal heart size. Trace amount of pericardial fluid or thickening is noted anteriorly the, unchanged. Aortic atherosclerosis. Coronary artery calcifications. Mediastinum/Nodes: Thyroid gland, trachea, and esophagus appear normal. There are no enlarged supraclavicular, axillary, mediastinal or hilar lymph nodes. Lungs/Pleura: No pleural effusion, airspace consolidation, atelectasis, or pneumothorax. Paraseptal and centrilobular emphysema. Multiple part solid lung nodules are again noted involving both lungs, including: -treated lesion within the posterior right upper lobe appears unchanged with a band of parenchymal thickening measuring 3.8 by 0.9 cm, image 66/5. Stable from previous exam. -part solid lesion within the apical segment of the right upper lobe measures 1.3 x 0.8 cm, image 31/5. Unchanged from previous study, image 31/5. -Part solid nodule within the medial right lower lobe measures 2.6 x 1.0 cm, image 65/5. Previously 2.2 x 1.0 cm. -part solid nodule in the  periphery of the right lower lobe measures 2.5 by 1.6 cm, image 82/5. Stable from previous exam. -Ground-glass nodule in the right middle lobe measures 9 mm, image 76/5. Stable in the interval. -Part solid, cystic lesion within the periphery of the left lower lobe measures 3.2 by 3.1 cm with peripheral solid component measuring 1.7 by 0.6 cm, image 114/5. Formally this measured 3.4 x 2.8 cm with nodular component inferiorly measuring 1.7 x 0.6 cm. -No new or enlarging lung nodules. Upper Abdomen: No acute abnormality. Musculoskeletal: No acute or suspicious osseous findings. IMPRESSION: 1. Stable appearance of the treated lesion within the posterior right upper lobe. 2. Multiple part solid lung nodules are again noted involving both  lungs. These are not significantly changed compared with previous exam. This includes the slowly enlarging nodule within the medial right lower lobe. These nodules remain morphologically concerning for low-grade multifocal pulmonary adenocarcinoma. 3. No signs of nodal or distant metastatic disease within the chest. 4. Coronary artery calcifications. 5. Aortic Atherosclerosis (ICD10-I70.0) and Emphysema (ICD10-J43.9). Electronically Signed   By: Kerby Moors M.D.   On: 01/01/2022 15:06        IMPRESSION/PLAN: 1. Stage IV non-small cell lung cancer, adenocarcinoma involving multifocal disease in the lung and brain metastases. The patient continues to do well clinically with his systemic therapy. He will continue Keytruda/Alimta with Dr. Julien Nordmann. We discussed the findings from his recent MRI and the rationale to proceed with reirradiation to the mid cerebellar lesion. He is in agreement. We reviewed the risks, benefits, short, and long term effects of therapy. He is scheduled for his infusion tomorrow and we will try to make the most of his time and simulate his treatment for SRS. He is in agreement with this plan and we will meet to review consent at that time.  2. Right parotid  pleomorphic adenoma. He did have biopsy earlier this year and the findings were benign but have the risk of malignant transformation. He will be followed in ENT by Dr. Fredric Dine on an annual basis now for this.      This encounter was conducted via telephone.  The patient has provided two factor identification and has given verbal consent for this type of encounter and has been advised to only accept a meeting of this type in a secure network environment. The time spent during this encounter was 45 minutes including preparation, discussion, and coordination of the patient's care. The attendants for this meeting include  Hayden Pedro  and [name].  During the encounter,  Hayden Pedro were located at [facility] Radiation Oncology Department.  Kyce CLEMENTS TORO was located at home.       Carola Rhine, PAC

## 2022-01-31 ENCOUNTER — Encounter: Payer: Self-pay | Admitting: Radiation Oncology

## 2022-01-31 NOTE — Progress Notes (Signed)
Telephone appointment. I verified patient's identity and began nursing interview. Patient is doing well. No [place] related issues reported at this time.  Meaningful use completed.  Reminded patient of his 3:00pm-02/03/22 telephone appointment w/ [facility]. I left my extension [phone] in case patient needs anything. Patient verbalized understanding.  Patient contact [phone]

## 2022-02-03 ENCOUNTER — Ambulatory Visit
Admission: RE | Admit: 2022-02-03 | Discharge: 2022-02-03 | Disposition: A | Discharge status: Home / Self Care | Payer: 59 | Source: Ambulatory Visit | Attending: Radiation Oncology | Admitting: Radiation Oncology

## 2022-02-03 ENCOUNTER — Inpatient Hospital Stay: Payer: 59

## 2022-02-03 ENCOUNTER — Telehealth: Payer: Self-pay

## 2022-02-03 DIAGNOSIS — C7931 Secondary malignant neoplasm of brain: Secondary | ICD-10-CM

## 2022-02-03 DIAGNOSIS — C3491 Malignant neoplasm of unspecified part of right bronchus or lung: Secondary | ICD-10-CM

## 2022-02-03 NOTE — Telephone Encounter (Signed)
Error

## 2022-02-04 ENCOUNTER — Ambulatory Visit
Admission: RE | Admit: 2022-02-04 | Discharge: 2022-02-04 | Disposition: A | Discharge status: Home / Self Care | Payer: 59 | Source: Ambulatory Visit | Attending: Radiation Oncology | Admitting: Radiation Oncology

## 2022-02-04 ENCOUNTER — Other Ambulatory Visit: Payer: Self-pay

## 2022-02-04 ENCOUNTER — Inpatient Hospital Stay (HOSPITAL_BASED_OUTPATIENT_CLINIC_OR_DEPARTMENT_OTHER): Payer: 59 | Admitting: Physician Assistant

## 2022-02-04 ENCOUNTER — Inpatient Hospital Stay: Payer: 59

## 2022-02-04 ENCOUNTER — Encounter: Payer: Self-pay | Admitting: Physician Assistant

## 2022-02-04 VITALS — BP 111/62 | HR 94 | Temp 98.1°F | Resp 17 | Wt 171.6 lb

## 2022-02-04 DIAGNOSIS — C3491 Malignant neoplasm of unspecified part of right bronchus or lung: Secondary | ICD-10-CM | POA: Diagnosis not present

## 2022-02-04 DIAGNOSIS — Z5112 Encounter for antineoplastic immunotherapy: Secondary | ICD-10-CM

## 2022-02-04 DIAGNOSIS — Z95828 Presence of other vascular implants and grafts: Secondary | ICD-10-CM

## 2022-02-04 LAB — TSH: TSH: 1.352 u[IU]/mL (ref 0.350–4.500)

## 2022-02-04 LAB — CBC WITH DIFFERENTIAL (CANCER CENTER ONLY)
Abs Immature Granulocytes: 0.02 10*3/uL (ref 0.00–0.07)
Basophils Absolute: 0 10*3/uL (ref 0.0–0.1)
Basophils Relative: 0 %
Eosinophils Absolute: 0.2 10*3/uL (ref 0.0–0.5)
Eosinophils Relative: 4 %
HCT: 30.8 % — ABNORMAL LOW (ref 39.0–52.0)
Hemoglobin: 10.8 g/dL — ABNORMAL LOW (ref 13.0–17.0)
Immature Granulocytes: 0 %
Lymphocytes Relative: 35 %
Lymphs Abs: 1.9 10*3/uL (ref 0.7–4.0)
MCH: 29.3 pg (ref 26.0–34.0)
MCHC: 35.1 g/dL (ref 30.0–36.0)
MCV: 83.5 fL (ref 80.0–100.0)
Monocytes Absolute: 0.9 10*3/uL (ref 0.1–1.0)
Monocytes Relative: 16 %
Neutro Abs: 2.5 10*3/uL (ref 1.7–7.7)
Neutrophils Relative %: 45 %
Platelet Count: 188 10*3/uL (ref 150–400)
RBC: 3.69 MIL/uL — ABNORMAL LOW (ref 4.22–5.81)
RDW: 16.2 % — ABNORMAL HIGH (ref 11.5–15.5)
WBC Count: 5.6 10*3/uL (ref 4.0–10.5)
nRBC: 0 % (ref 0.0–0.2)

## 2022-02-04 LAB — CMP (CANCER CENTER ONLY)
ALT: 18 U/L (ref 0–44)
AST: 28 U/L (ref 15–41)
Albumin: 3.6 g/dL (ref 3.5–5.0)
Alkaline Phosphatase: 45 U/L (ref 38–126)
Anion gap: 3 — ABNORMAL LOW (ref 5–15)
BUN: 13 mg/dL (ref 8–23)
CO2: 28 mmol/L (ref 22–32)
Calcium: 9 mg/dL (ref 8.9–10.3)
Chloride: 108 mmol/L (ref 98–111)
Creatinine: 1.29 mg/dL — ABNORMAL HIGH (ref 0.61–1.24)
GFR, Estimated: >60 mL/min (ref >60)
Glucose, Bld: 99 mg/dL (ref 70–99)
Potassium: 3.6 mmol/L (ref 3.5–5.1)
Sodium: 139 mmol/L (ref 135–145)
Total Bilirubin: 0.3 mg/dL (ref 0.3–1.2)
Total Protein: 6.7 g/dL (ref 6.5–8.1)

## 2022-02-04 MED ORDER — HEPARIN SOD (PORK) LOCK FLUSH 100 UNIT/ML IV SOLN
500.0000 [IU] | Freq: Once | INTRAVENOUS | Status: AC | PRN
  Administered 2022-02-04: 500 [IU]

## 2022-02-04 MED ORDER — SODIUM CHLORIDE 0.9 % IV SOLN: Freq: Once | INTRAVENOUS | Status: AC

## 2022-02-04 MED ORDER — SODIUM CHLORIDE 0.9% FLUSH
10.0000 mL | Freq: Once | INTRAVENOUS | Status: AC
  Administered 2022-02-04: 10 mL

## 2022-02-04 MED ORDER — SODIUM CHLORIDE 0.9% FLUSH
10.0000 mL | INTRAVENOUS | Status: DC | PRN
  Administered 2022-02-04: 10 mL

## 2022-02-04 MED ORDER — SODIUM CHLORIDE 0.9 % IV SOLN
500.0000 mg/m2 | Freq: Once | INTRAVENOUS | Status: AC
  Administered 2022-02-04: 1000 mg via INTRAVENOUS
  Filled 2022-02-04: qty 40

## 2022-02-04 MED ORDER — SODIUM CHLORIDE 0.9 % IV SOLN
200.0000 mg | Freq: Once | INTRAVENOUS | Status: AC
  Administered 2022-02-04: 200 mg via INTRAVENOUS
  Filled 2022-02-04: qty 200

## 2022-02-04 MED ORDER — PROCHLORPERAZINE MALEATE 10 MG PO TABS
10.0000 mg | ORAL_TABLET | Freq: Once | ORAL | Status: AC
  Administered 2022-02-04: 10 mg via ORAL
  Filled 2022-02-04: qty 1

## 2022-02-04 NOTE — Patient Instructions (Signed)
[facility]  Discharge Instructions: Thank you for choosing [facility] to provide your oncology and hematology care.   If you have a lab appointment with the [facility], please go directly to the [facility] and check in at the registration area.   Wear comfortable clothing and clothing appropriate for easy access to any Portacath or PICC line.   We strive to give you quality time with your provider. You may need to reschedule your appointment if you arrive late (15 or more minutes).  Arriving late affects you and other patients whose appointments are after yours.  Also, if you miss three or more appointments without notifying the office, you may be dismissed from the clinic at the provider's discretion.      For prescription refill requests, have your pharmacy contact our office and allow 72 hours for refills to be completed.    Today you received the following chemotherapy and/or immunotherapy agents: Keytruda and Alimta      To help prevent nausea and vomiting after your treatment, we encourage you to take your nausea medication as directed.  BELOW ARE SYMPTOMS THAT SHOULD BE REPORTED IMMEDIATELY: *FEVER GREATER THAN 100.4 F (38 C) OR HIGHER *CHILLS OR SWEATING *NAUSEA AND VOMITING THAT IS NOT CONTROLLED WITH YOUR NAUSEA MEDICATION *UNUSUAL SHORTNESS OF BREATH *UNUSUAL BRUISING OR BLEEDING *URINARY PROBLEMS (pain or burning when urinating, or frequent urination) *BOWEL PROBLEMS (unusual diarrhea, constipation, pain near the anus) TENDERNESS IN MOUTH AND THROAT WITH OR WITHOUT PRESENCE OF ULCERS (sore throat, sores in mouth, or a toothache) UNUSUAL RASH, SWELLING OR PAIN  UNUSUAL VAGINAL DISCHARGE OR ITCHING   Items with * indicate a potential emergency and should be followed up as soon as possible or go to the Emergency Department if any problems should occur.  Please show the CHEMOTHERAPY ALERT CARD or IMMUNOTHERAPY ALERT CARD at  check-in to the Emergency Department and triage nurse.  Should you have questions after your visit or need to cancel or reschedule your appointment, please contact [facility]  Dept: [phone]  and follow the prompts.  Office hours are 8:00 a.m. to 4:30 p.m. Monday - Friday. Please note that voicemails left after 4:00 p.m. may not be returned until the following business day.  We are closed weekends and major holidays. You have access to a nurse at all times for urgent questions. Please call the main number to the clinic Dept: [phone] and follow the prompts.   For any non-urgent questions, you may also contact your provider using MyChart. We now offer e-Visits for anyone 75 and older to request care online for non-urgent symptoms. For details visit [email or web address].   Also download the MyChart app! Go to the app store, search "MyChart", open the app, select [facility], and log in with your MyChart username and password.  Masks are optional in the cancer centers. If you would like for your care team to wear a mask while they are taking care of you, please let them know. You may have one support person who is at least 65 years old accompany you for your appointments.

## 2022-02-04 NOTE — Progress Notes (Signed)
Pt PAC to remain accessed for CT simulation, CT simulation contacted and aware, pt ambulatory to lobby to check in for CT simulation.

## 2022-02-05 ENCOUNTER — Other Ambulatory Visit: Payer: Self-pay

## 2022-02-06 ENCOUNTER — Other Ambulatory Visit: Payer: Self-pay

## 2022-02-07 DIAGNOSIS — Z5112 Encounter for antineoplastic immunotherapy: Secondary | ICD-10-CM | POA: Diagnosis not present

## 2022-02-12 ENCOUNTER — Encounter: Payer: Self-pay | Admitting: Radiation Oncology

## 2022-02-12 ENCOUNTER — Other Ambulatory Visit: Payer: Self-pay

## 2022-02-12 ENCOUNTER — Ambulatory Visit
Admission: RE | Admit: 2022-02-12 | Discharge: 2022-02-12 | Disposition: A | Discharge status: Home / Self Care | Payer: 59 | Source: Ambulatory Visit | Attending: Radiation Oncology | Admitting: Radiation Oncology

## 2022-02-12 DIAGNOSIS — Z5112 Encounter for antineoplastic immunotherapy: Secondary | ICD-10-CM | POA: Diagnosis not present

## 2022-02-12 LAB — RAD ONC ARIA SESSION SUMMARY
Course Elapsed Days: 0
Plan Fractions Treated to Date: 1
Plan Prescribed Dose Per Fraction: 20 Gy
Plan Total Fractions Prescribed: 1
Plan Total Prescribed Dose: 20 Gy
Reference Point Dosage Given to Date: 20 Gy
Reference Point Session Dosage Given: 20 Gy
Session Number: 1

## 2022-02-12 NOTE — Op Note (Signed)
  Name: Nicolas Allen  MRN: [id number]  Date: 02/12/2022   DOB: 1956-01-29  Stereotactic Radiosurgery Operative Note  PRE-OPERATIVE DIAGNOSIS:  Solitary Brain Metastasis  POST-OPERATIVE DIAGNOSIS:  Solitary Brain Metastasis  PROCEDURE:  Stereotactic Radiosurgery  SURGEON:  Charlie Pitter, MD  NARRATIVE: The patient underwent a radiation treatment planning session in the radiation oncology simulation suite under the care of the radiation oncology physician and physicist.  I participated closely in the radiation treatment planning afterwards. The patient underwent planning CT which was fused to 3T high resolution MRI with 1 mm axial slices.  These images were fused on the planning system.  We contoured the gross target volumes and subsequently expanded this to yield the Planning Target Volume. I actively participated in the planning process.  I helped to define and review the target contours and also the contours of the optic pathway, eyes, brainstem and selected nearby organs at risk.  All the dose constraints for critical structures were reviewed and compared to AAPM Task Group 101.  The prescription dose conformity was reviewed.  I approved the plan electronically.    Accordingly, Nicolas Allen was brought to the TrueBeam stereotactic radiation treatment linac and placed in the custom immobilization mask.  The patient was aligned according to the IR fiducial markers with BrainLab Exactrac, then orthogonal x-rays were used in ExacTrac with the 6DOF robotic table and the shifts were made to align the patient  Nicolas Allen received stereotactic radiosurgery uneventfully.    The detailed description of the procedure is recorded in the radiation oncology procedure note.  I was present for the duration of the procedure.  DISPOSITION:  Following delivery, the patient was transported to nursing in stable condition and monitored for possible acute effects to be discharged to home in stable condition  with follow-up in one month.  Charlie Pitter, MD 02/12/2022 3:53 PM

## 2022-02-12 NOTE — Progress Notes (Signed)
Mr. Delo rested with [name] for 30 minutes following his SRS treatment.  Patient denies headache, dizziness, nausea, diplopia or ringing in the ears. Denies fatigue. Patient without complaints. Understands to avoid strenuous activity for the next 24 hours and call [phone] with needs.   BP 99/63 (BP Location: Left Arm, Patient Position: Sitting, Cuff Size: Normal)   Pulse 77   Temp 97.9 F (36.6 C)   Resp 20   SpO2 100%    Taite Schoeppner M. Leonie Green, BSN

## 2022-02-13 ENCOUNTER — Encounter: Payer: Self-pay | Admitting: Internal Medicine

## 2022-02-13 ENCOUNTER — Telehealth: Payer: Self-pay | Admitting: Radiation Therapy

## 2022-02-13 NOTE — Progress Notes (Signed)
                                                                                                                                                             Patient Name: Nicolas Allen MRN: [id number] DOB: 1956-04-02 Referring Physician: Cathlean Cower (Profile Not Attached) Date of Service: 02/12/2022 [facility], [place]                                                        End Of Treatment Note  Diagnoses: C79.31-Secondary malignant neoplasm of brain  Cancer Staging:  Stage IV non-small cell lung cancer, adenocarcinoma involving multifocal disease in the lung and brain metastases  Intent: Palliative  Radiation Treatment Dates:  02/12/2022 through 02/12/2022 SRS Treatment Site Technique Total Dose (Gy) Dose per Fx (Gy) Completed Fx Beam Energies  Brain:  PTV_2_Retreat_71mm Mid Cerebellum IMRT 20/20 20 1/1 [id number]   Narrative: The patient tolerated radiation therapy relatively well.    Plan: The patient will receive a call in about one month from the radiation oncology department. He will continue follow up with Dr. Julien Nordmann as well as be followed in the brain oncology program with MRI in about 3 months time.   ________________________________________________    Carola Rhine, PAC

## 2022-02-13 NOTE — Telephone Encounter (Signed)
Called to check in on Mr. Dymek to see how he is feeling after completing his brain SRS treatment course. I was unable to leave a message on his phone, but did leave a message on his wife's line requesting a call back.   Mont Dutton R.T.(R)(T) Radiation Special Procedures Navigator

## 2022-02-17 ENCOUNTER — Other Ambulatory Visit: Payer: Self-pay

## 2022-02-19 ENCOUNTER — Telehealth: Payer: Self-pay | Admitting: Internal Medicine

## 2022-02-19 NOTE — Telephone Encounter (Signed)
Called patient regarding 10/24 rescheduled appointment, patient is notified.

## 2022-02-25 ENCOUNTER — Other Ambulatory Visit (INDEPENDENT_AMBULATORY_CARE_PROVIDER_SITE_OTHER): Payer: Medicare Other

## 2022-02-25 ENCOUNTER — Other Ambulatory Visit: Payer: Self-pay

## 2022-02-25 ENCOUNTER — Inpatient Hospital Stay: Payer: Medicare Other | Attending: Internal Medicine

## 2022-02-25 ENCOUNTER — Ambulatory Visit: Payer: 59

## 2022-02-25 ENCOUNTER — Other Ambulatory Visit: Payer: Self-pay | Admitting: Internal Medicine

## 2022-02-25 ENCOUNTER — Inpatient Hospital Stay: Payer: Medicare Other

## 2022-02-25 ENCOUNTER — Ambulatory Visit: Payer: 59 | Admitting: Internal Medicine

## 2022-02-25 ENCOUNTER — Encounter: Payer: Self-pay | Admitting: Internal Medicine

## 2022-02-25 ENCOUNTER — Inpatient Hospital Stay (HOSPITAL_BASED_OUTPATIENT_CLINIC_OR_DEPARTMENT_OTHER): Payer: Medicare Other | Admitting: Internal Medicine

## 2022-02-25 ENCOUNTER — Other Ambulatory Visit: Payer: 59

## 2022-02-25 DIAGNOSIS — Z79899 Other long term (current) drug therapy: Secondary | ICD-10-CM | POA: Insufficient documentation

## 2022-02-25 DIAGNOSIS — R7302 Impaired glucose tolerance (oral): Secondary | ICD-10-CM | POA: Diagnosis not present

## 2022-02-25 DIAGNOSIS — Z87891 Personal history of nicotine dependence: Secondary | ICD-10-CM | POA: Diagnosis not present

## 2022-02-25 DIAGNOSIS — C3411 Malignant neoplasm of upper lobe, right bronchus or lung: Secondary | ICD-10-CM | POA: Diagnosis not present

## 2022-02-25 DIAGNOSIS — Z5111 Encounter for antineoplastic chemotherapy: Secondary | ICD-10-CM | POA: Insufficient documentation

## 2022-02-25 DIAGNOSIS — C7931 Secondary malignant neoplasm of brain: Secondary | ICD-10-CM | POA: Insufficient documentation

## 2022-02-25 DIAGNOSIS — E7800 Pure hypercholesterolemia, unspecified: Secondary | ICD-10-CM

## 2022-02-25 DIAGNOSIS — Z5112 Encounter for antineoplastic immunotherapy: Secondary | ICD-10-CM | POA: Diagnosis not present

## 2022-02-25 DIAGNOSIS — E559 Vitamin D deficiency, unspecified: Secondary | ICD-10-CM

## 2022-02-25 DIAGNOSIS — C3491 Malignant neoplasm of unspecified part of right bronchus or lung: Secondary | ICD-10-CM

## 2022-02-25 DIAGNOSIS — C3431 Malignant neoplasm of lower lobe, right bronchus or lung: Secondary | ICD-10-CM | POA: Insufficient documentation

## 2022-02-25 DIAGNOSIS — E538 Deficiency of other specified B group vitamins: Secondary | ICD-10-CM

## 2022-02-25 DIAGNOSIS — Z95828 Presence of other vascular implants and grafts: Secondary | ICD-10-CM

## 2022-02-25 LAB — CMP (CANCER CENTER ONLY)
ALT: 17 U/L (ref 0–44)
AST: 32 U/L (ref 15–41)
Albumin: 3.5 g/dL (ref 3.5–5.0)
Alkaline Phosphatase: 48 U/L (ref 38–126)
Anion gap: 4 — ABNORMAL LOW (ref 5–15)
BUN: 12 mg/dL (ref 8–23)
CO2: 28 mmol/L (ref 22–32)
Calcium: 9.2 mg/dL (ref 8.9–10.3)
Chloride: 107 mmol/L (ref 98–111)
Creatinine: 1.27 mg/dL — ABNORMAL HIGH (ref 0.61–1.24)
GFR, Estimated: >60 mL/min (ref >60)
Glucose, Bld: 102 mg/dL — ABNORMAL HIGH (ref 70–99)
Potassium: 4.2 mmol/L (ref 3.5–5.1)
Sodium: 139 mmol/L (ref 135–145)
Total Bilirubin: 0.2 mg/dL — ABNORMAL LOW (ref 0.3–1.2)
Total Protein: 7.1 g/dL (ref 6.5–8.1)

## 2022-02-25 LAB — CBC WITH DIFFERENTIAL (CANCER CENTER ONLY)
Abs Immature Granulocytes: 0.02 10*3/uL (ref 0.00–0.07)
Basophils Absolute: 0 10*3/uL (ref 0.0–0.1)
Basophils Relative: 1 %
Eosinophils Absolute: 0.3 10*3/uL (ref 0.0–0.5)
Eosinophils Relative: 6 %
HCT: 31.3 % — ABNORMAL LOW (ref 39.0–52.0)
Hemoglobin: 10.8 g/dL — ABNORMAL LOW (ref 13.0–17.0)
Immature Granulocytes: 0 %
Lymphocytes Relative: 29 %
Lymphs Abs: 1.6 10*3/uL (ref 0.7–4.0)
MCH: 28.7 pg (ref 26.0–34.0)
MCHC: 34.5 g/dL (ref 30.0–36.0)
MCV: 83.2 fL (ref 80.0–100.0)
Monocytes Absolute: 1 10*3/uL (ref 0.1–1.0)
Monocytes Relative: 17 %
Neutro Abs: 2.7 10*3/uL (ref 1.7–7.7)
Neutrophils Relative %: 47 %
Platelet Count: 233 10*3/uL (ref 150–400)
RBC: 3.76 MIL/uL — ABNORMAL LOW (ref 4.22–5.81)
RDW: 15.5 % (ref 11.5–15.5)
WBC Count: 5.6 10*3/uL (ref 4.0–10.5)
nRBC: 0 % (ref 0.0–0.2)

## 2022-02-25 LAB — LIPID PANEL
Cholesterol: 161 mg/dL (ref 0–200)
HDL: 50.4 mg/dL (ref >39.00)
LDL Cholesterol: 95 mg/dL (ref 0–99)
NonHDL: 110.17
Total CHOL/HDL Ratio: 3
Triglycerides: 75 mg/dL (ref 0.0–149.0)
VLDL: 15 mg/dL (ref 0.0–40.0)

## 2022-02-25 LAB — HEMOGLOBIN A1C: Hgb A1c MFr Bld: 6.3 % (ref 4.6–6.5)

## 2022-02-25 LAB — VITAMIN D 25 HYDROXY (VIT D DEFICIENCY, FRACTURES): VITD: 67.27 ng/mL (ref 30.00–100.00)

## 2022-02-25 LAB — TSH: TSH: 2.242 u[IU]/mL (ref 0.350–4.500)

## 2022-02-25 LAB — VITAMIN B12: Vitamin B-12: >1500 pg/mL — ABNORMAL HIGH (ref 211–911)

## 2022-02-25 MED ORDER — SODIUM CHLORIDE 0.9 % IV SOLN: Freq: Once | INTRAVENOUS | Status: AC

## 2022-02-25 MED ORDER — SODIUM CHLORIDE 0.9% FLUSH
10.0000 mL | INTRAVENOUS | Status: DC | PRN
  Administered 2022-02-25: 10 mL

## 2022-02-25 MED ORDER — PROCHLORPERAZINE MALEATE 10 MG PO TABS
10.0000 mg | ORAL_TABLET | Freq: Once | ORAL | Status: AC
  Administered 2022-02-25: 10 mg via ORAL
  Filled 2022-02-25: qty 1

## 2022-02-25 MED ORDER — SODIUM CHLORIDE 0.9 % IV SOLN
200.0000 mg | Freq: Once | INTRAVENOUS | Status: AC
  Administered 2022-02-25: 200 mg via INTRAVENOUS
  Filled 2022-02-25: qty 200

## 2022-02-25 MED ORDER — SODIUM CHLORIDE 0.9% FLUSH
10.0000 mL | Freq: Once | INTRAVENOUS | Status: AC
  Administered 2022-02-25: 10 mL

## 2022-02-25 MED ORDER — HEPARIN SOD (PORK) LOCK FLUSH 100 UNIT/ML IV SOLN
500.0000 [IU] | Freq: Once | INTRAVENOUS | Status: AC | PRN
  Administered 2022-02-25: 500 [IU]

## 2022-02-25 MED ORDER — SODIUM CHLORIDE 0.9 % IV SOLN
500.0000 mg/m2 | Freq: Once | INTRAVENOUS | Status: AC
  Administered 2022-02-25: 1000 mg via INTRAVENOUS
  Filled 2022-02-25: qty 40

## 2022-02-25 MED ORDER — CYANOCOBALAMIN 1000 MCG/ML IJ SOLN
1000.0000 ug | Freq: Once | INTRAMUSCULAR | Status: AC
  Administered 2022-02-25: 1000 ug via INTRAMUSCULAR
  Filled 2022-02-25: qty 1

## 2022-02-25 NOTE — Patient Instructions (Signed)
[facility]  Discharge Instructions: Thank you for choosing [facility] to provide your oncology and hematology care.   If you have a lab appointment with the [facility], please go directly to the [facility] and check in at the registration area.   Wear comfortable clothing and clothing appropriate for easy access to any Portacath or PICC line.   We strive to give you quality time with your provider. You may need to reschedule your appointment if you arrive late (15 or more minutes).  Arriving late affects you and other patients whose appointments are after yours.  Also, if you miss three or more appointments without notifying the office, you may be dismissed from the clinic at the provider's discretion.      For prescription refill requests, have your pharmacy contact our office and allow 72 hours for refills to be completed.    Today you received the following chemotherapy and/or immunotherapy agents: Keytruda and Alimta      To help prevent nausea and vomiting after your treatment, we encourage you to take your nausea medication as directed.  BELOW ARE SYMPTOMS THAT SHOULD BE REPORTED IMMEDIATELY: *FEVER GREATER THAN 100.4 F (38 C) OR HIGHER *CHILLS OR SWEATING *NAUSEA AND VOMITING THAT IS NOT CONTROLLED WITH YOUR NAUSEA MEDICATION *UNUSUAL SHORTNESS OF BREATH *UNUSUAL BRUISING OR BLEEDING *URINARY PROBLEMS (pain or burning when urinating, or frequent urination) *BOWEL PROBLEMS (unusual diarrhea, constipation, pain near the anus) TENDERNESS IN MOUTH AND THROAT WITH OR WITHOUT PRESENCE OF ULCERS (sore throat, sores in mouth, or a toothache) UNUSUAL RASH, SWELLING OR PAIN  UNUSUAL VAGINAL DISCHARGE OR ITCHING   Items with * indicate a potential emergency and should be followed up as soon as possible or go to the Emergency Department if any problems should occur.  Please show the CHEMOTHERAPY ALERT CARD or IMMUNOTHERAPY ALERT CARD at  check-in to the Emergency Department and triage nurse.  Should you have questions after your visit or need to cancel or reschedule your appointment, please contact [facility]  Dept: [phone]  and follow the prompts.  Office hours are 8:00 a.m. to 4:30 p.m. Monday - Friday. Please note that voicemails left after 4:00 p.m. may not be returned until the following business day.  We are closed weekends and major holidays. You have access to a nurse at all times for urgent questions. Please call the main number to the clinic Dept: [phone] and follow the prompts.   For any non-urgent questions, you may also contact your provider using MyChart. We now offer e-Visits for anyone 70 and older to request care online for non-urgent symptoms. For details visit [email or web address].   Also download the MyChart app! Go to the app store, search "MyChart", open the app, select [facility], and log in with your MyChart username and password.  Masks are optional in the cancer centers. If you would like for your care team to wear a mask while they are taking care of you, please let them know. You may have one support person who is at least 65 years old accompany you for your appointments.

## 2022-02-25 NOTE — Progress Notes (Signed)
[facility] [phone]   [phone]  OFFICE PROGRESS NOTE  [name], [place] [id number]  DIAGNOSIS: Stage IV (T3, N2, M1c) non-small cell lung cancer favoring adenocarcinoma presented with right upper lobe lung mass in addition to right lower lobe, left lower lobe in addition to right hilar and mediastinal lymphadenopathy in addition to metastatic brain lesions diagnosed in October 2021.  Molecular studies by [employer]:  [id number], 9.5%,   PRIOR THERAPY: SRS to 3 brain lesion under the care of Dr. Lisbeth Renshaw.  CURRENT THERAPY: Systemic chemotherapy with carboplatin for AUC of 5, Alimta 500 mg/M2 and Keytruda 200 mg IV every 3 weeks.  First dose April 24, 2020.  Status post 32 cycles.  Starting from cycle #5 the patient will be on maintenance treatment with Alimta and Keytruda every 3 weeks.  INTERVAL HISTORY: Nicolas Allen 66 y.o. male returns to the clinic today for follow-up visit.  The patient is feeling fine today with no concerning complaints.  He denied having any chest pain, shortness of breath, cough or hemoptysis.  He has no nausea, vomiting, diarrhea or constipation.  He has no headache or visual changes.  He denied having any recent weight loss or night sweats.  He has been tolerating his treatment with maintenance Alimta and Keytruda fairly well.  He is here today for evaluation before starting cycle #33.  MEDICAL HISTORY: Past Medical History:  Diagnosis Date   Anemia 06/24/2012   ANXIETY 02/03/2007   Cervical disc disease 02/12/2012   S/p surgury jan 2013   Erectile dysfunction 02/11/2011   GERD (gastroesophageal reflux disease)    GLUCOSE INTOLERANCE 01/04/2008   HYPERLIPIDEMIA 02/03/2007   HYPERTENSION 02/03/2007   IBS (irritable bowel syndrome)    Impaired glucose tolerance 02/11/2011   nscl ca 03/30/2020   Smoker 08/22/2014   Substance abuse ([place]) 2001   sober for 16 yrs     ALLERGIES:  has No Known Allergies.  MEDICATIONS:  Current Outpatient Medications  Medication Sig Dispense Refill   amLODipine-benazepril (LOTREL) 10-20 MG capsule Take 1 capsule by mouth once daily 90 capsule 3   aspirin 325 MG EC tablet Take 1 tablet (325 mg total) by mouth daily. 90 tablet 99   b complex vitamins tablet Take 1 tablet by mouth daily.     cholecalciferol (VITAMIN D3) 25 MCG (1000 UNIT) tablet Take 1,000 Units by mouth daily.     folic acid (FOLVITE) 1 MG tablet Take 1 tablet (1 mg total) by mouth daily. 90 tablet 3   Garlic 8099 MG CAPS Take 1,000 mg by mouth daily.      lidocaine-prilocaine (EMLA) cream Apply to the Port-A-Cath site 30-60 minutes before chemotherapy. (Patient not taking: Reported on 02/04/2022) 30 g 0   Omega-3 Fatty Acids (FISH OIL PO) Take 1 capsule by mouth daily.      polyethylene glycol (MIRALAX / GLYCOLAX) 17 g packet Take 17 g by mouth daily as needed.     prochlorperazine (COMPAZINE) 10 MG tablet Take 1 tablet (10 mg total) by mouth every 6 (six) hours as needed for nausea or vomiting. 30 tablet 0   rosuvastatin (CRESTOR) 20 MG tablet Take 1 tablet by mouth once daily 90 tablet 2   vardenafil (LEVITRA) 20 MG tablet TAKE 1 TABLET BY MOUTH AS NEEDED FOR  ERECTILE  DYSFUNCTION 10 tablet 5   No current facility-administered medications for this visit.    SURGICAL  HISTORY:  Past Surgical History:  Procedure Laterality Date   BRONCHIAL BIOPSY  03/30/2020   Procedure: BRONCHIAL BIOPSIES;  Surgeon: Garner Nash, DO;  Location: [facility];  Service: Pulmonary;;   BRONCHIAL BRUSHINGS  03/30/2020   Procedure: BRONCHIAL BRUSHINGS;  Surgeon: Garner Nash, DO;  Location: [facility];  Service: Pulmonary;;   BRONCHIAL NEEDLE ASPIRATION BIOPSY  03/30/2020   Procedure: BRONCHIAL NEEDLE ASPIRATION BIOPSIES;  Surgeon: Garner Nash, DO;  Location: [facility];  Service: Pulmonary;;   BRONCHIAL WASHINGS  03/30/2020   Procedure: BRONCHIAL  WASHINGS;  Surgeon: Garner Nash, DO;  Location: [facility];  Service: Pulmonary;;   COLONOSCOPY  2007   IR IMAGING GUIDED PORT INSERTION  04/23/2020   neck fusion  2012   C4   NO PAST SURGERIES     VIDEO BRONCHOSCOPY WITH ENDOBRONCHIAL NAVIGATION N/A 03/30/2020   Procedure: VIDEO BRONCHOSCOPY WITH ENDOBRONCHIAL NAVIGATION;  Surgeon: Garner Nash, DO;  Location: [facility];  Service: Pulmonary;  Laterality: N/A;   VIDEO BRONCHOSCOPY WITH ENDOBRONCHIAL ULTRASOUND N/A 03/30/2020   Procedure: VIDEO BRONCHOSCOPY WITH ENDOBRONCHIAL ULTRASOUND;  Surgeon: Garner Nash, DO;  Location: [facility];  Service: Pulmonary;  Laterality: N/A;    REVIEW OF SYSTEMS:  A comprehensive review of systems was negative.   PHYSICAL EXAMINATION: General appearance: alert, cooperative, and no distress Head: Normocephalic, without obvious abnormality, atraumatic Neck: no adenopathy, no JVD, supple, symmetrical, trachea midline, and thyroid not enlarged, symmetric, no tenderness/mass/nodules Lymph nodes: Cervical, supraclavicular, and axillary nodes normal. Resp: clear to auscultation bilaterally Back: symmetric, no curvature. ROM normal. No CVA tenderness. Cardio: regular rate and rhythm, S1, S2 normal, no murmur, click, rub or gallop GI: soft, non-tender; bowel sounds normal; no masses,  no organomegaly Extremities: extremities normal, atraumatic, no cyanosis or edema  ECOG PERFORMANCE STATUS: 1 - Symptomatic but completely ambulatory  Blood pressure 119/61, pulse 65, temperature 97.9 F (36.6 C), temperature source Tympanic, resp. rate 17, height 6' (1.829 m), weight 170 lb 6.4 oz (77.3 kg), SpO2 100 %.  LABORATORY DATA: Lab Results  Component Value Date   WBC 5.6 02/25/2022   HGB 10.8 (L) 02/25/2022   HCT 31.3 (L) 02/25/2022   MCV 83.2 02/25/2022   PLT 233 02/25/2022      Chemistry      Component Value Date/Time   NA 139 02/04/2022 0936   K 3.6 02/04/2022 0936   CL 108 02/04/2022  0936   CO2 28 02/04/2022 0936   BUN 13 02/04/2022 0936   CREATININE 1.29 (H) 02/04/2022 0936   CREATININE 0.88 02/21/2020 1115      Component Value Date/Time   CALCIUM 9.0 02/04/2022 0936   ALKPHOS 45 02/04/2022 0936   AST 28 02/04/2022 0936   ALT 18 02/04/2022 0936   BILITOT 0.3 02/04/2022 0936       RADIOGRAPHIC STUDIES: MR Brain W Wo Contrast  Result Date: 01/31/2022 CLINICAL DATA:  66 year old male with metastatic lung cancer. Brain metastases treated with SRS in 2021. Suggestion of mild enlarging treated cerebellar vermis lesion in April. Restaging. EXAM: MRI HEAD WITHOUT AND WITH CONTRAST TECHNIQUE: Multiplanar, multiecho pulse sequences of the brain and surrounding structures were obtained without and with intravenous contrast. CONTRAST:  59mL MULTIHANCE GADOBENATE DIMEGLUMINE 529 MG/ML IV SOLN COMPARISON:  09/20/2021 and earlier. FINDINGS: Brain: Posterior midline vermis enhancing lesion again appears mildly larger since April, now 9-10 mm long axis on series 13, image 41 and series 15, image 14. This was 6-7 mm in April, and  5-6 mm with the same postcontrast on 01/31/2021. However, no associated cerebellar edema or mass effect. The lesion is isointense on T2 imaging. No other abnormal intracranial enhancement identified. No dural thickening. No restricted diffusion to suggest acute infarction. No midline shift, mass effect, ventriculomegaly, extra-axial collection or acute intracranial hemorrhage. Cervicomedullary junction and pituitary are within normal limits. Nonspecific patchy and scattered bilateral cerebral white matter T2 and FLAIR hyperintensity has not significantly changed since last year. Vascular: Major intracranial vascular flow voids are stable. Skull and upper cervical spine: Negative visible cervical spine. Normal visible bone marrow signal. Sinuses/Orbits: Stable, negative. Other: Mastoids remain clear. Visible internal auditory structures appear normal. Continued stable  roughly 2 cm chronic enhancing mass in the right parotid gland (series 2, image 5), not significantly changed since 04/07/2020. IMPRESSION: 1. Continued enlargement of the solitary residual enhancing treated metastasis in the posterior midline cerebellar vermis. This has almost doubled in long axis from one year ago, now 9-10 mm. However, there is no associated cerebellar edema or mass effect. 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 Recommend continued surveillance. 2. No new metastatic disease identified. 3. Chronic roughly 2 cm right parotid gland nodule has not significantly changed since October 2021, favoring benign primary salivary neoplasm. Electronically Signed   By: Genevie Ann M.D.   On: 01/31/2022 10:23     ASSESSMENT AND PLAN: This is a very pleasant 66 years old African-American male recently diagnosed with stage IV (T3, N2, M1c)  non-small cell lung cancer, adenocarcinoma presented with right upper lobe lung mass in addition to right lower lobe, left lower lobe in addition to right hilar and mediastinal lymphadenopathy in addition to metastatic brain lesions diagnosed in October 2021. The patient has molecular studies by [employer] that showed no actionable mutations. He underwent SRS treatment to his brain lesion. The patient is currently undergoing systemic chemotherapy with carboplatin for AUC of 5, Alimta 500 mg/M2 and Keytruda 200 mg IV every 3 weeks status post 32 cycles.  Starting from cycle #5 the patient is on maintenance treatment with Alimta and Keytruda every 3 weeks. The patient has been tolerating this treatment well with no concerning adverse effects. I recommended for him to proceed with cycle #33 today as planned. I will see him back for follow-up visit in 3 weeks for evaluation before starting the next cycle of his treatment. He was advised to call immediately if he has any other concerning symptoms in the interval. The patient voices understanding of current disease status and  treatment options and is in agreement with the current care plan.  All questions were answered. The patient knows to call the clinic with any problems, questions or concerns. We can certainly see the patient much sooner if necessary.  Disclaimer: This note was dictated with voice recognition software. Similar sounding words can inadvertently be transcribed and may not be corrected upon review.

## 2022-02-26 LAB — T4: T4, Total: 10.3 ug/dL (ref 4.5–12.0)

## 2022-02-27 ENCOUNTER — Encounter: Payer: Self-pay | Admitting: Internal Medicine

## 2022-02-27 ENCOUNTER — Ambulatory Visit (INDEPENDENT_AMBULATORY_CARE_PROVIDER_SITE_OTHER): Payer: Medicare Other | Admitting: Internal Medicine

## 2022-02-27 VITALS — BP 114/66 | HR 72 | Temp 98.2°F | Ht 72.0 in | Wt 171.0 lb

## 2022-02-27 DIAGNOSIS — E538 Deficiency of other specified B group vitamins: Secondary | ICD-10-CM

## 2022-02-27 DIAGNOSIS — E7800 Pure hypercholesterolemia, unspecified: Secondary | ICD-10-CM

## 2022-02-27 DIAGNOSIS — R7302 Impaired glucose tolerance (oral): Secondary | ICD-10-CM

## 2022-02-27 DIAGNOSIS — I700 Atherosclerosis of aorta: Secondary | ICD-10-CM

## 2022-02-27 DIAGNOSIS — Z125 Encounter for screening for malignant neoplasm of prostate: Secondary | ICD-10-CM

## 2022-02-27 DIAGNOSIS — Z23 Encounter for immunization: Secondary | ICD-10-CM

## 2022-02-27 DIAGNOSIS — E559 Vitamin D deficiency, unspecified: Secondary | ICD-10-CM

## 2022-02-27 DIAGNOSIS — I10 Essential (primary) hypertension: Secondary | ICD-10-CM

## 2022-02-27 MED ORDER — ROSUVASTATIN CALCIUM 40 MG PO TABS: 40.0000 mg | ORAL_TABLET | Freq: Every day | ORAL | 3 refills | Status: DC

## 2022-02-27 NOTE — Progress Notes (Unsigned)
Patient ID: Nicolas Allen, male   DOB: 1955/08/02, 66 y.o.   MRN: [id number]        Chief Complaint: follow up HTN, HLD and hyperglycemia, low b12, aortic atherosclerosis       HPI:  Nicolas Allen is a 66 y.o. male here with c/o        Wt Readings from Last 3 Encounters:  02/27/22 171 lb (77.6 kg)  02/25/22 170 lb 6.4 oz (77.3 kg)  02/04/22 171 lb 9.6 oz (77.8 kg)   BP Readings from Last 3 Encounters:  02/27/22 114/66  02/25/22 119/61  02/12/22 99/63         Past Medical History:  Diagnosis Date   Anemia 06/24/2012   ANXIETY 02/03/2007   Cervical disc disease 02/12/2012   S/p surgury jan 2013   Erectile dysfunction 02/11/2011   GERD (gastroesophageal reflux disease)    GLUCOSE INTOLERANCE 01/04/2008   HYPERLIPIDEMIA 02/03/2007   HYPERTENSION 02/03/2007   IBS (irritable bowel syndrome)    Impaired glucose tolerance 02/11/2011   nscl ca 03/30/2020   Smoker 08/22/2014   Substance abuse ([place]) 2001   sober for 16 yrs   Past Surgical History:  Procedure Laterality Date   BRONCHIAL BIOPSY  03/30/2020   Procedure: BRONCHIAL BIOPSIES;  Surgeon: Garner Nash, DO;  Location: [facility];  Service: Pulmonary;;   BRONCHIAL BRUSHINGS  03/30/2020   Procedure: BRONCHIAL BRUSHINGS;  Surgeon: Garner Nash, DO;  Location: [facility];  Service: Pulmonary;;   BRONCHIAL NEEDLE ASPIRATION BIOPSY  03/30/2020   Procedure: BRONCHIAL NEEDLE ASPIRATION BIOPSIES;  Surgeon: Garner Nash, DO;  Location: [facility];  Service: Pulmonary;;   BRONCHIAL WASHINGS  03/30/2020   Procedure: BRONCHIAL WASHINGS;  Surgeon: Garner Nash, DO;  Location: [facility];  Service: Pulmonary;;   COLONOSCOPY  2007   IR IMAGING GUIDED PORT INSERTION  04/23/2020   neck fusion  2012   C4   NO PAST SURGERIES     VIDEO BRONCHOSCOPY WITH ENDOBRONCHIAL NAVIGATION N/A 03/30/2020   Procedure: VIDEO BRONCHOSCOPY WITH ENDOBRONCHIAL NAVIGATION;  Surgeon: Garner Nash, DO;  Location: [facility];   Service: Pulmonary;  Laterality: N/A;   VIDEO BRONCHOSCOPY WITH ENDOBRONCHIAL ULTRASOUND N/A 03/30/2020   Procedure: VIDEO BRONCHOSCOPY WITH ENDOBRONCHIAL ULTRASOUND;  Surgeon: Garner Nash, DO;  Location: [facility];  Service: Pulmonary;  Laterality: N/A;    reports that he quit smoking about 2 years ago. His smoking use included cigarettes. He has a 45.00 pack-year smoking history. He has quit using smokeless tobacco.  His smokeless tobacco use included chew. He reports that he does not drink alcohol and does not use drugs. family history includes Cancer in his cousin and mother; Hypertension in an other family member; Stroke in an other family member. No Known Allergies Current Outpatient Medications on File Prior to Visit  Medication Sig Dispense Refill   amLODipine-benazepril (LOTREL) 10-20 MG capsule Take 1 capsule by mouth once daily 90 capsule 3   aspirin 325 MG EC tablet Take 1 tablet (325 mg total) by mouth daily. 90 tablet 99   b complex vitamins tablet Take 1 tablet by mouth daily.     cholecalciferol (VITAMIN D3) 25 MCG (1000 UNIT) tablet Take 1,000 Units by mouth daily.     folic acid (FOLVITE) 1 MG tablet Take 1 tablet (1 mg total) by mouth daily. 90 tablet 3   Garlic 1941 MG CAPS Take 1,000 mg by mouth daily.      lidocaine-prilocaine (EMLA) cream  Apply to the Port-A-Cath site 30-60 minutes before chemotherapy. 30 g 0   Omega-3 Fatty Acids (FISH OIL PO) Take 1 capsule by mouth daily.      polyethylene glycol (MIRALAX / GLYCOLAX) 17 g packet Take 17 g by mouth daily as needed.     prochlorperazine (COMPAZINE) 10 MG tablet Take 1 tablet (10 mg total) by mouth every 6 (six) hours as needed for nausea or vomiting. 30 tablet 0   rosuvastatin (CRESTOR) 20 MG tablet Take 1 tablet by mouth once daily 90 tablet 2   vardenafil (LEVITRA) 20 MG tablet TAKE 1 TABLET BY MOUTH AS NEEDED FOR  ERECTILE  DYSFUNCTION 10 tablet 5   No current facility-administered medications on file prior to  visit.        ROS:  All others reviewed and negative.  Objective        PE:  BP 114/66 (BP Location: Right Arm, Patient Position: Sitting, Cuff Size: Large)   Pulse 72   Temp 98.2 F (36.8 C) (Oral)   Ht 6' (1.829 m)   Wt 171 lb (77.6 kg)   SpO2 98%   BMI 23.19 kg/m                 Constitutional: Pt appears in NAD               HENT: Head: NCAT.                Right Ear: External ear normal.                 Left Ear: External ear normal.                Eyes: . Pupils are equal, round, and reactive to light. Conjunctivae and EOM are normal               Nose: without d/c or deformity               Neck: Neck supple. Gross normal ROM               Cardiovascular: Normal rate and regular rhythm.                 Pulmonary/Chest: Effort normal and breath sounds without rales or wheezing.                Abd:  Soft, NT, ND, + BS, no organomegaly               Neurological: Pt is alert. At baseline orientation, motor grossly intact               Skin: Skin is warm. No rashes, no other new lesions, LE edema - ***               Psychiatric: Pt behavior is normal without agitation   Micro: none  Cardiac tracings I have personally interpreted today:  none  Pertinent Radiological findings (summarize): none   Lab Results  Component Value Date   WBC 5.6 02/25/2022   HGB 10.8 (L) 02/25/2022   HCT 31.3 (L) 02/25/2022   PLT 233 02/25/2022   GLUCOSE 102 (H) 02/25/2022   CHOL 161 02/25/2022   TRIG 75.0 02/25/2022   HDL 50.40 02/25/2022   LDLCALC 95 02/25/2022   ALT 17 02/25/2022   AST 32 02/25/2022   NA 139 02/25/2022   K 4.2 02/25/2022   CL 107 02/25/2022   CREATININE 1.27 (H) 02/25/2022   BUN  12 02/25/2022   CO2 28 02/25/2022   TSH 2.242 02/25/2022   PSA 0.37 08/26/2021   INR 1.0 03/30/2020   HGBA1C 6.3 02/25/2022   Assessment/Plan:  Nicolas Allen is a 66 y.o. Black or African American [2] male with  has a past medical history of Anemia (06/24/2012), ANXIETY  (02/03/2007), Cervical disc disease (02/12/2012), Erectile dysfunction (02/11/2011), GERD (gastroesophageal reflux disease), GLUCOSE INTOLERANCE (01/04/2008), HYPERLIPIDEMIA (02/03/2007), HYPERTENSION (02/03/2007), IBS (irritable bowel syndrome), Impaired glucose tolerance (02/11/2011), nscl ca (03/30/2020), Smoker (08/22/2014), and Substance abuse ([place]) (2001).  No problem-specific Assessment & Plan notes found for this encounter.  Followup: No follow-ups on file.  Cathlean Cower, MD 02/27/2022 8:16 AM [facility]

## 2022-02-27 NOTE — Patient Instructions (Addendum)
You had the Prevnar 20 pneumonia shot today  You had the flu shot today  Ok to increase the crestor to 40 mg per day  Ok to decrease the B12 to Mon- Wed - Fri  Please continue all other medications as before, and refills have been done if requested.  Please have the pharmacy call with any other refills you may need.  Please continue your efforts at being more active, low cholesterol diet, and weight control.  Please keep your appointments with your specialists as you may have planned  Please make an Appointment to return in 6 months, or sooner if needed, also with Lab Appointment for testing done 3-5 days before at the [facility] (so this is for TWO appointments - please see the scheduling desk as you leave)

## 2022-02-28 ENCOUNTER — Other Ambulatory Visit: Payer: Self-pay

## 2022-03-02 ENCOUNTER — Encounter: Payer: Self-pay | Admitting: Internal Medicine

## 2022-03-02 NOTE — Assessment & Plan Note (Signed)
Lab Results  Component Value Date   HGBA1C 6.3 02/25/2022   Stable, pt to continue current medical treatment  - diet, wt control, excercise

## 2022-03-02 NOTE — Assessment & Plan Note (Signed)
Lab Results  Component Value Date   Nicolas Allen 02/25/2022   Uncontrolled, goal ldl < 70, pt to increase crestor to 40 mg qd, cont low chol diet

## 2022-03-02 NOTE — Assessment & Plan Note (Signed)
Pt to continue crestor 40 mg , low chol diet, exercise

## 2022-03-02 NOTE — Assessment & Plan Note (Signed)
Last vitamin D Lab Results  Component Value Date   VD25OH 67.27 02/25/2022   Stable, cont oral replacement

## 2022-03-02 NOTE — Assessment & Plan Note (Signed)
Lab Results  Component Value Date   VITAMINB12 >1500 (H) 02/25/2022   Overcontrolled, ok to decrease the oral replacement - b12 1000 mcg to mon - wed- fri only

## 2022-03-02 NOTE — Assessment & Plan Note (Signed)
BP Readings from Last 3 Encounters:  02/27/22 114/66  02/25/22 119/61  02/12/22 99/63   Stable, pt to continue medical treatment lotrel 10-20 mg qd

## 2022-03-03 ENCOUNTER — Telehealth: Payer: Self-pay

## 2022-03-03 NOTE — Telephone Encounter (Signed)
Pt called stating he is constipated and has not had a BM in almost 2 weeks. Pt states he has been taking MiraLax and this has not helped.  Pt has been advised to do the following:  Take 30mg  (1Tbl) of Milk of Magnesia in 8 ounces of prune juice, warm slight in the microwave.  Once her has had a bowel movement, begin taking Senna-S, 1-2 tablets twice daily and MiraLax 17grams in 8 ounces of liquids 1-2 times daily as needed. Pt was reminded to stay well hydrated and drink non caffeinated beverages.  Pt expressed understanding of this information.

## 2022-03-13 NOTE — Progress Notes (Signed)
[facility] OFFICE PROGRESS NOTE  [name], MD [place] [address]  DIAGNOSIS: Stage IV (T3, N2, M1c) non-small cell lung cancer favoring adenocarcinoma presented with right upper lobe lung mass in addition to right lower lobe, left lower lobe in addition to right hilar and mediastinal lymphadenopathy in addition to metastatic brain lesions diagnosed in October 2021.   Molecular studies by [employer]:   [id number], 9.5%,  PRIOR THERAPY: 1) SRS to 3 brain lesion under the care of Dr. Lisbeth Renshaw.  Completed on April 20, 2020 2) SRS to the recurrent brain lesion under the care of Dr. Lisbeth Renshaw. Completed on 02/12/22  CURRENT THERAPY: 1) Systemic chemotherapy with carboplatin for AUC of 5, Alimta 500 mg/M2 and Keytruda 200 mg IV every 3 weeks.  First dose April 24, 2020.  Status post 33 cycles.  Starting from cycle #5 the patient will be on maintenance treatment with Alimta and Keytruda every 3 weeks. 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 This was resumed starting from cycle #24.   INTERVAL HISTORY: Nicolas Allen 66 y.o. male returns to the clinic today for a follow-up visit. The patient is feeling fairly well today without any concerning complaints.  He is currently undergoing treatment with alimta and [place]. 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 This was resumed from cycle #24. He tolerated his last cycle well without any concerning adverse side effects except he had severe constipation in the interval since last being seen. He is taking a stool softener daily at this time.  He denies any fever, chills, night sweats, or unexplained weight loss.  Denies any nausea, diarrhea, or vomiting.  Denies any chest pain, shortness of breath, or hemoptysis.  Denies any significant cough.  Denies any headache or visual changes. He completed SRS re-treatment to the brain  lesion under the care of Dr. Lisbeth Renshaw on 02/12/22. He tolerated that well. He is here for evaluation and repeat blood work before starting cycle #34.   MEDICAL HISTORY: Past Medical History:  Diagnosis Date   Anemia 06/24/2012   ANXIETY 02/03/2007   Cervical disc disease 02/12/2012   S/p surgury jan 2013   Erectile dysfunction 02/11/2011   GERD (gastroesophageal reflux disease)    GLUCOSE INTOLERANCE 01/04/2008   HYPERLIPIDEMIA 02/03/2007   HYPERTENSION 02/03/2007   IBS (irritable bowel syndrome)    Impaired glucose tolerance 02/11/2011   nscl ca 03/30/2020   Smoker 08/22/2014   Substance abuse ([place]) 2001   sober for 16 yrs    ALLERGIES:  has No Known Allergies.  MEDICATIONS:  Current Outpatient Medications  Medication Sig Dispense Refill   amLODipine-benazepril (LOTREL) 10-20 MG capsule Take 1 capsule by mouth once daily 90 capsule 3   aspirin 325 MG EC tablet Take 1 tablet (325 mg total) by mouth daily. 90 tablet 99   b complex vitamins tablet Take 1 tablet by mouth daily.     cholecalciferol (VITAMIN D3) 25 MCG (1000 UNIT) tablet Take 1,000 Units by mouth daily.     folic acid (FOLVITE) 1 MG tablet Take 1 tablet (1 mg total) by mouth daily. 90 tablet 3   Garlic 9470 MG CAPS Take 1,000 mg by mouth daily.      lidocaine-prilocaine (EMLA) cream Apply to the Port-A-Cath site 30-60 minutes before chemotherapy. 30 g 0   Omega-3 Fatty Acids (FISH OIL PO) Take 1 capsule by mouth  daily.      polyethylene glycol (MIRALAX / GLYCOLAX) 17 g packet Take 17 g by mouth daily as needed.     prochlorperazine (COMPAZINE) 10 MG tablet Take 1 tablet (10 mg total) by mouth every 6 (six) hours as needed for nausea or vomiting. 30 tablet 0   rosuvastatin (CRESTOR) 40 MG tablet Take 1 tablet (40 mg total) by mouth daily. 90 tablet 3   vardenafil (LEVITRA) 20 MG tablet TAKE 1 TABLET BY MOUTH AS NEEDED FOR  ERECTILE  DYSFUNCTION 10 tablet 5   No current facility-administered medications for this visit.    Facility-Administered Medications Ordered in Other Visits  Medication Dose Route Frequency Provider Last Rate Last Admin   heparin lock flush 100 unit/mL  500 Units Intracatheter Once PRN Curt Bears, MD       pembrolizumab [facility]) 200 mg in sodium chloride 0.9 % 50 mL chemo infusion  200 mg Intravenous Once Curt Bears, MD       PEMEtrexed (ALIMTA) 1,000 mg in sodium chloride 0.9 % 100 mL chemo infusion  500 mg/m2 (Treatment Plan Recorded) Intravenous Once Curt Bears, MD       prochlorperazine (COMPAZINE) tablet 10 mg  10 mg Oral Once Curt Bears, MD       sodium chloride flush (NS) 0.9 % injection 10 mL  10 mL Intracatheter PRN Curt Bears, MD        SURGICAL HISTORY:  Past Surgical History:  Procedure Laterality Date   BRONCHIAL BIOPSY  03/30/2020   Procedure: BRONCHIAL BIOPSIES;  Surgeon: Garner Nash, DO;  Location: [facility];  Service: Pulmonary;;   BRONCHIAL BRUSHINGS  03/30/2020   Procedure: BRONCHIAL BRUSHINGS;  Surgeon: Garner Nash, DO;  Location: [facility];  Service: Pulmonary;;   BRONCHIAL NEEDLE ASPIRATION BIOPSY  03/30/2020   Procedure: BRONCHIAL NEEDLE ASPIRATION BIOPSIES;  Surgeon: Garner Nash, DO;  Location: [facility];  Service: Pulmonary;;   BRONCHIAL WASHINGS  03/30/2020   Procedure: BRONCHIAL WASHINGS;  Surgeon: Garner Nash, DO;  Location: [facility];  Service: Pulmonary;;   COLONOSCOPY  2007   IR IMAGING GUIDED PORT INSERTION  04/23/2020   neck fusion  2012   C4   NO PAST SURGERIES     VIDEO BRONCHOSCOPY WITH ENDOBRONCHIAL NAVIGATION N/A 03/30/2020   Procedure: VIDEO BRONCHOSCOPY WITH ENDOBRONCHIAL NAVIGATION;  Surgeon: Garner Nash, DO;  Location: [facility];  Service: Pulmonary;  Laterality: N/A;   VIDEO BRONCHOSCOPY WITH ENDOBRONCHIAL ULTRASOUND N/A 03/30/2020   Procedure: VIDEO BRONCHOSCOPY WITH ENDOBRONCHIAL ULTRASOUND;  Surgeon: Garner Nash, DO;  Location: [facility];  Service: Pulmonary;   Laterality: N/A;    REVIEW OF SYSTEMS:   Constitutional: Negative for appetite change, chills, fatigue, fever and unexpected weight change.  HENT: Negative for mouth sores, nosebleeds, sore throat and trouble swallowing.   Eyes: Negative for eye problems and icterus.  Respiratory: Negative for cough, hemoptysis, shortness of breath and wheezing.   Cardiovascular: Negative for chest pain and leg swelling.  Gastrointestinal: Negative for abdominal pain, constipation, diarrhea, nausea and vomiting.  Genitourinary: Negative for bladder incontinence, difficulty urinating, dysuria, frequency and hematuria.   Musculoskeletal: Negative for back pain, gait problem, neck pain and neck stiffness.  Skin: Negative for itching and rash.  Neurological: Negative for dizziness, extremity weakness, gait problem, headaches, light-headedness and seizures.  Hematological: Negative for adenopathy. Does not bruise/bleed easily.  Psychiatric/Behavioral: Negative for confusion, depression and sleep disturbance. The patient is not nervous/anxious.       PHYSICAL EXAMINATION:  Blood pressure (!) 102/58, pulse 69, temperature 98.1 F (36.7 C), temperature source Oral, resp. rate 18, weight 168 lb 9 oz (76.5 kg), SpO2 100 %.  ECOG PERFORMANCE STATUS: 1  Physical Exam  Constitutional: Oriented to person, place, and time and well-developed, well-nourished, and in no distress. HENT:  Head: Normocephalic and atraumatic.  Mouth/Throat: Oropharynx is clear and moist. No oropharyngeal exudate.  Eyes: Conjunctivae are normal. Right eye exhibits no discharge. Left eye exhibits no discharge. No scleral icterus.  Neck: Normal range of motion. Neck supple.  Cardiovascular: Normal rate, regular rhythm, normal heart sounds and intact distal pulses.   Pulmonary/Chest: Effort normal and breath sounds normal. No respiratory distress. No wheezes. No rales.  Abdominal: Soft. Bowel sounds are normal. Exhibits no distension and no  mass. There is no tenderness.  Musculoskeletal: Normal range of motion. Exhibits no edema.   Lymphadenopathy:    No cervical adenopathy.  Neurological: Alert and oriented to person, place, and time. Exhibits normal muscle tone. Gait normal. Coordination normal.  Skin: Skin is warm and dry. No rash noted. Not diaphoretic. No erythema. No pallor.  Psychiatric: Mood, memory and judgment normal.  Vitals reviewed.  LABORATORY DATA: Lab Results  Component Value Date   WBC 4.6 03/18/2022   HGB 10.1 (L) 03/18/2022   HCT 29.2 (L) 03/18/2022   MCV 83.7 03/18/2022   PLT 214 03/18/2022      Chemistry      Component Value Date/Time   NA 138 03/18/2022 1031   K 3.8 03/18/2022 1031   CL 106 03/18/2022 1031   CO2 28 03/18/2022 1031   BUN 14 03/18/2022 1031   CREATININE 1.07 03/18/2022 1031   CREATININE 0.88 02/21/2020 1115      Component Value Date/Time   CALCIUM 8.8 (L) 03/18/2022 1031   ALKPHOS 48 03/18/2022 1031   AST 25 03/18/2022 1031   ALT 18 03/18/2022 1031   BILITOT 0.3 03/18/2022 1031       RADIOGRAPHIC STUDIES:  No results found.   ASSESSMENT/PLAN:  This is a very pleasant 66 year old African-American male diagnosed with stage IV (T3, N2, M1C) non-small cell lung cancer, adenocarcinoma.  He presented with a right upper lobe lung mass in addition to a right lower lobe, left lower lobe lung lesions with right hilar and mediastinal lymphadenopathy.  He also had metastatic disease to the brain.  He was diagnosed in October 2022.  His molecular studies by [employer] did not show any actionable mutations.   The patient underwent SRS treatment to the metastatic brain lesions under the care of Dr. Lisbeth Renshaw.  This was completed on 04/20/2021.   He completed re-treatment SRS to the progressive brain lesion in the cerebellum under the care of Dr. Lisbeth Renshaw on 02/12/22.   The patient is currently undergoing systemic chemotherapy/immunotherapy.  He started with carboplatin for an AUC of 5,  Alimta 500 mg per metered squared, Keytruda 200 mg IV every 3 weeks.  He is status post 33 cycles.  Starting from cycle #5, the patient has been on maintenance treatment with Alimta and Keytruda IV every 3 weeks. Starting from cycle #22, the patient has been on single agent alimta. [name] has been on hold due to CT findings of suspicious immunotherapy mediated enteritis. [name] was resumed with cycle #24 and he has been tolerating it well.    Labs were reviewed.  Recommend that he proceed with cycle #34 today scheduled.  We will see him back for follow-up visit in 3 weeks for evaluation  and repeat blood work before starting cycle #35. We will order a restaging CT scan of the CAP at his next appointment.    The patient was advised to call immediately if she has any concerning symptoms in the interval. The patient voices understanding of current disease status and treatment options and is in agreement with the current care plan. All questions were answered. The patient knows to call the clinic with any problems, questions or concerns. We can certainly see the patient much sooner if necessary      No orders of the defined types were placed in this encounter.    The total time spent in the appointment was 20-29 minutes.   Ladon Heney L Jeanluc Wegman, PA-C 03/18/22

## 2022-03-16 NOTE — Progress Notes (Signed)
  Radiation Oncology         [phone] ________________________________  Name: Nicolas Allen MRN: [id number]  Date of Service: 03/17/2022  DOB: Mar 08, 1956  Post Treatment Telephone Note  Diagnosis:   Stage IV non-small cell lung cancer, adenocarcinoma involving multifocal disease in the lung and brain metastases.  Intent: Palliative  Radiation Treatment Dates:  02/12/2022 through 02/12/2022 SRS Treatment Site Technique Total Dose (Gy) Dose per Fx (Gy) Completed Fx Beam Energies  Brain:  [id number] Mid Cerebellum IMRT 20/20 20 1/1 [id number]   Narrative: The patient tolerated radiation therapy relatively well.     Impression/Plan: 1. Stage IV non-small cell lung cancer, adenocarcinoma involving multifocal disease in the lung and brain metastases. I tried to reach the patient but he does not have voicemail set up. He will continue follow up with Dr. Julien Nordmann as well as be followed in the brain oncology program with MRI in about 3 months time.      Carola Rhine, PAC

## 2022-03-17 ENCOUNTER — Ambulatory Visit
Admission: RE | Admit: 2022-03-17 | Discharge: 2022-03-17 | Disposition: A | Discharge status: Home / Self Care | Payer: Medicare Other | Source: Ambulatory Visit | Attending: Internal Medicine | Admitting: Internal Medicine

## 2022-03-17 DIAGNOSIS — C3491 Malignant neoplasm of unspecified part of right bronchus or lung: Secondary | ICD-10-CM

## 2022-03-17 DIAGNOSIS — C7931 Secondary malignant neoplasm of brain: Secondary | ICD-10-CM

## 2022-03-18 ENCOUNTER — Inpatient Hospital Stay: Payer: Medicare Other | Attending: Internal Medicine | Admitting: Physician Assistant

## 2022-03-18 ENCOUNTER — Inpatient Hospital Stay: Payer: Medicare Other

## 2022-03-18 VITALS — BP 102/58 | HR 69 | Temp 98.1°F | Resp 18 | Wt 168.6 lb

## 2022-03-18 VITALS — BP 98/59 | HR 67 | Temp 97.6°F | Resp 18

## 2022-03-18 DIAGNOSIS — Z79899 Other long term (current) drug therapy: Secondary | ICD-10-CM | POA: Insufficient documentation

## 2022-03-18 DIAGNOSIS — C3491 Malignant neoplasm of unspecified part of right bronchus or lung: Secondary | ICD-10-CM

## 2022-03-18 DIAGNOSIS — C3431 Malignant neoplasm of lower lobe, right bronchus or lung: Secondary | ICD-10-CM | POA: Insufficient documentation

## 2022-03-18 DIAGNOSIS — Z87891 Personal history of nicotine dependence: Secondary | ICD-10-CM | POA: Diagnosis not present

## 2022-03-18 DIAGNOSIS — Z5112 Encounter for antineoplastic immunotherapy: Secondary | ICD-10-CM | POA: Insufficient documentation

## 2022-03-18 DIAGNOSIS — C7931 Secondary malignant neoplasm of brain: Secondary | ICD-10-CM | POA: Diagnosis not present

## 2022-03-18 DIAGNOSIS — C3411 Malignant neoplasm of upper lobe, right bronchus or lung: Secondary | ICD-10-CM | POA: Insufficient documentation

## 2022-03-18 DIAGNOSIS — Z5111 Encounter for antineoplastic chemotherapy: Secondary | ICD-10-CM | POA: Insufficient documentation

## 2022-03-18 DIAGNOSIS — Z95828 Presence of other vascular implants and grafts: Secondary | ICD-10-CM

## 2022-03-18 LAB — CBC WITH DIFFERENTIAL (CANCER CENTER ONLY)
Abs Immature Granulocytes: 0.01 10*3/uL (ref 0.00–0.07)
Basophils Absolute: 0 10*3/uL (ref 0.0–0.1)
Basophils Relative: 1 %
Eosinophils Absolute: 0.2 10*3/uL (ref 0.0–0.5)
Eosinophils Relative: 4 %
HCT: 29.2 % — ABNORMAL LOW (ref 39.0–52.0)
Hemoglobin: 10.1 g/dL — ABNORMAL LOW (ref 13.0–17.0)
Immature Granulocytes: 0 %
Lymphocytes Relative: 40 %
Lymphs Abs: 1.9 10*3/uL (ref 0.7–4.0)
MCH: 28.9 pg (ref 26.0–34.0)
MCHC: 34.6 g/dL (ref 30.0–36.0)
MCV: 83.7 fL (ref 80.0–100.0)
Monocytes Absolute: 0.6 10*3/uL (ref 0.1–1.0)
Monocytes Relative: 14 %
Neutro Abs: 1.9 10*3/uL (ref 1.7–7.7)
Neutrophils Relative %: 41 %
Platelet Count: 214 10*3/uL (ref 150–400)
RBC: 3.49 MIL/uL — ABNORMAL LOW (ref 4.22–5.81)
RDW: 16.1 % — ABNORMAL HIGH (ref 11.5–15.5)
WBC Count: 4.6 10*3/uL (ref 4.0–10.5)
nRBC: 0 % (ref 0.0–0.2)

## 2022-03-18 LAB — CMP (CANCER CENTER ONLY)
ALT: 18 U/L (ref 0–44)
AST: 25 U/L (ref 15–41)
Albumin: 3.3 g/dL — ABNORMAL LOW (ref 3.5–5.0)
Alkaline Phosphatase: 48 U/L (ref 38–126)
Anion gap: 4 — ABNORMAL LOW (ref 5–15)
BUN: 14 mg/dL (ref 8–23)
CO2: 28 mmol/L (ref 22–32)
Calcium: 8.8 mg/dL — ABNORMAL LOW (ref 8.9–10.3)
Chloride: 106 mmol/L (ref 98–111)
Creatinine: 1.07 mg/dL (ref 0.61–1.24)
GFR, Estimated: >60 mL/min (ref >60)
Glucose, Bld: 90 mg/dL (ref 70–99)
Potassium: 3.8 mmol/L (ref 3.5–5.1)
Sodium: 138 mmol/L (ref 135–145)
Total Bilirubin: 0.3 mg/dL (ref 0.3–1.2)
Total Protein: 6.4 g/dL — ABNORMAL LOW (ref 6.5–8.1)

## 2022-03-18 LAB — TSH: TSH: 1.747 u[IU]/mL (ref 0.350–4.500)

## 2022-03-18 MED ORDER — SODIUM CHLORIDE 0.9 % IV SOLN
200.0000 mg | Freq: Once | INTRAVENOUS | Status: AC
  Administered 2022-03-18: 200 mg via INTRAVENOUS
  Filled 2022-03-18: qty 200

## 2022-03-18 MED ORDER — HEPARIN SOD (PORK) LOCK FLUSH 100 UNIT/ML IV SOLN
500.0000 [IU] | Freq: Once | INTRAVENOUS | Status: AC | PRN
  Administered 2022-03-18: 500 [IU]

## 2022-03-18 MED ORDER — SODIUM CHLORIDE 0.9 % IV SOLN: Freq: Once | INTRAVENOUS | Status: AC

## 2022-03-18 MED ORDER — SODIUM CHLORIDE 0.9% FLUSH
10.0000 mL | Freq: Once | INTRAVENOUS | Status: AC
  Administered 2022-03-18: 10 mL

## 2022-03-18 MED ORDER — PROCHLORPERAZINE MALEATE 10 MG PO TABS
10.0000 mg | ORAL_TABLET | Freq: Once | ORAL | Status: AC
  Administered 2022-03-18: 10 mg via ORAL
  Filled 2022-03-18: qty 1

## 2022-03-18 MED ORDER — SODIUM CHLORIDE 0.9 % IV SOLN
500.0000 mg/m2 | Freq: Once | INTRAVENOUS | Status: AC
  Administered 2022-03-18: 1000 mg via INTRAVENOUS
  Filled 2022-03-18: qty 40

## 2022-03-18 MED ORDER — SODIUM CHLORIDE 0.9% FLUSH
10.0000 mL | INTRAVENOUS | Status: DC | PRN
  Administered 2022-03-18: 10 mL

## 2022-03-18 NOTE — Patient Instructions (Signed)
[facility]COL[facility]  Discharge Instructions: Thank you for choosing [facility] to provide your oncology and hematology care.   If you have a lab appointment with the [facility], please go directly to the [facility] and check in at the registration area.   Wear comfortable clothing and clothing appropriate for easy access to any Portacath or PICC line.   We strive to give you quality time with your provider. You may need to reschedule your appointment if you arrive late (15 or more minutes).  Arriving late affects you and other patients whose appointments are after yours.  Also, if you miss three or more appointments without notifying the office, you may be dismissed from the clinic at the provider's discretion.      For prescription refill requests, have your pharmacy contact our office and allow 72 hours for refills to be completed.    Today you received the following chemotherapy and/or immunotherapy agents: Keytruda and Alimta      To help prevent nausea and vomiting after your treatment, we encourage you to take your nausea medication as directed.  BELOW ARE SYMPTOMS THAT SHOULD BE REPORTED IMMEDIATELY: *FEVER GREATER THAN 100.4 F (38 C) OR HIGHER *CHILLS OR SWEATING *NAUSEA AND VOMITING THAT IS NOT CONTROLLED WITH YOUR NAUSEA MEDICATION *UNUSUAL SHORTNESS OF BREATH *UNUSUAL BRUISING OR BLEEDING *URINARY PROBLEMS (pain or burning when urinating, or frequent urination) *BOWEL PROBLEMS (unusual diarrhea, constipation, pain near the anus) TENDERNESS IN MOUTH AND THROAT WITH OR WITHOUT PRESENCE OF ULCERS (sore throat, sores in mouth, or a toothache) UNUSUAL RASH, SWELLING OR PAIN  UNUSUAL VAGINAL DISCHARGE OR ITCHING   Items with * indicate a potential emergency and should be followed up as soon as possible or go to the Emergency Department if any problems should occur.  Please show the CHEMOTHERAPY ALERT CARD or IMMUNOTHERAPY ALERT CARD at  check-in to the Emergency Department and triage nurse.  Should you have questions after your visit or need to cancel or reschedule your appointment, please contact [facility]  Dept: [phone]  and follow the prompts.  Office hours are 8:00 a.m. to 4:30 p.m. Monday - Friday. Please note that voicemails left after 4:00 p.m. may not be returned until the following business day.  We are closed weekends and major holidays. You have access to a nurse at all times for urgent questions. Please call the main number to the clinic Dept: [phone] and follow the prompts.   For any non-urgent questions, you may also contact your provider using MyChart. We now offer e-Visits for anyone 23 and older to request care online for non-urgent symptoms. For details visit [email or web address].   Also download the MyChart app! Go to the app store, search "MyChart", open the app, select [facility], and log in with your MyChart username and password.  Masks are optional in the cancer centers. If you would like for your care team to wear a mask while they are taking care of you, please let them know. You may have one support person who is at least 65 years old accompany you for your appointments.

## 2022-03-31 ENCOUNTER — Telehealth: Payer: Self-pay | Admitting: Internal Medicine

## 2022-03-31 NOTE — Telephone Encounter (Signed)
Called patient regarding upcoming October, November and December appointments. Patient is notified.

## 2022-04-08 ENCOUNTER — Ambulatory Visit: Payer: 59 | Admitting: Internal Medicine

## 2022-04-08 ENCOUNTER — Ambulatory Visit: Payer: 59

## 2022-04-08 ENCOUNTER — Other Ambulatory Visit: Payer: 59

## 2022-04-08 ENCOUNTER — Inpatient Hospital Stay: Payer: Medicare Other

## 2022-04-08 ENCOUNTER — Inpatient Hospital Stay (HOSPITAL_BASED_OUTPATIENT_CLINIC_OR_DEPARTMENT_OTHER): Payer: Medicare Other | Admitting: Internal Medicine

## 2022-04-08 ENCOUNTER — Encounter: Payer: Self-pay | Admitting: Internal Medicine

## 2022-04-08 VITALS — BP 106/58

## 2022-04-08 VITALS — BP 98/57 | HR 84 | Temp 98.4°F | Resp 16 | Wt 173.7 lb

## 2022-04-08 DIAGNOSIS — C3431 Malignant neoplasm of lower lobe, right bronchus or lung: Secondary | ICD-10-CM | POA: Diagnosis not present

## 2022-04-08 DIAGNOSIS — Z95828 Presence of other vascular implants and grafts: Secondary | ICD-10-CM

## 2022-04-08 DIAGNOSIS — C3491 Malignant neoplasm of unspecified part of right bronchus or lung: Secondary | ICD-10-CM

## 2022-04-08 DIAGNOSIS — Z87891 Personal history of nicotine dependence: Secondary | ICD-10-CM | POA: Diagnosis not present

## 2022-04-08 DIAGNOSIS — C3411 Malignant neoplasm of upper lobe, right bronchus or lung: Secondary | ICD-10-CM | POA: Diagnosis not present

## 2022-04-08 DIAGNOSIS — Z5112 Encounter for antineoplastic immunotherapy: Secondary | ICD-10-CM | POA: Diagnosis not present

## 2022-04-08 DIAGNOSIS — Z5111 Encounter for antineoplastic chemotherapy: Secondary | ICD-10-CM | POA: Diagnosis not present

## 2022-04-08 DIAGNOSIS — Z79899 Other long term (current) drug therapy: Secondary | ICD-10-CM | POA: Diagnosis not present

## 2022-04-08 DIAGNOSIS — C3490 Malignant neoplasm of unspecified part of unspecified bronchus or lung: Secondary | ICD-10-CM | POA: Diagnosis not present

## 2022-04-08 DIAGNOSIS — C7931 Secondary malignant neoplasm of brain: Secondary | ICD-10-CM | POA: Diagnosis not present

## 2022-04-08 LAB — CBC WITH DIFFERENTIAL (CANCER CENTER ONLY)
Abs Immature Granulocytes: 0.01 10*3/uL (ref 0.00–0.07)
Basophils Absolute: 0 10*3/uL (ref 0.0–0.1)
Basophils Relative: 0 %
Eosinophils Absolute: 0.1 10*3/uL (ref 0.0–0.5)
Eosinophils Relative: 3 %
HCT: 30.5 % — ABNORMAL LOW (ref 39.0–52.0)
Hemoglobin: 10.5 g/dL — ABNORMAL LOW (ref 13.0–17.0)
Immature Granulocytes: 0 %
Lymphocytes Relative: 32 %
Lymphs Abs: 1.4 10*3/uL (ref 0.7–4.0)
MCH: 29.4 pg (ref 26.0–34.0)
MCHC: 34.4 g/dL (ref 30.0–36.0)
MCV: 85.4 fL (ref 80.0–100.0)
Monocytes Absolute: 0.6 10*3/uL (ref 0.1–1.0)
Monocytes Relative: 14 %
Neutro Abs: 2.2 10*3/uL (ref 1.7–7.7)
Neutrophils Relative %: 51 %
Platelet Count: 191 10*3/uL (ref 150–400)
RBC: 3.57 MIL/uL — ABNORMAL LOW (ref 4.22–5.81)
RDW: 17.6 % — ABNORMAL HIGH (ref 11.5–15.5)
WBC Count: 4.3 10*3/uL (ref 4.0–10.5)
nRBC: 0 % (ref 0.0–0.2)

## 2022-04-08 LAB — CMP (CANCER CENTER ONLY)
ALT: 17 U/L (ref 0–44)
AST: 23 U/L (ref 15–41)
Albumin: 3.2 g/dL — ABNORMAL LOW (ref 3.5–5.0)
Alkaline Phosphatase: 44 U/L (ref 38–126)
Anion gap: 4 — ABNORMAL LOW (ref 5–15)
BUN: 13 mg/dL (ref 8–23)
CO2: 27 mmol/L (ref 22–32)
Calcium: 8.8 mg/dL — ABNORMAL LOW (ref 8.9–10.3)
Chloride: 107 mmol/L (ref 98–111)
Creatinine: 1.22 mg/dL (ref 0.61–1.24)
GFR, Estimated: >60 mL/min (ref >60)
Glucose, Bld: 121 mg/dL — ABNORMAL HIGH (ref 70–99)
Potassium: 3.8 mmol/L (ref 3.5–5.1)
Sodium: 138 mmol/L (ref 135–145)
Total Bilirubin: 0.3 mg/dL (ref 0.3–1.2)
Total Protein: 6.5 g/dL (ref 6.5–8.1)

## 2022-04-08 LAB — TSH: TSH: 1.87 u[IU]/mL (ref 0.350–4.500)

## 2022-04-08 MED ORDER — SODIUM CHLORIDE 0.9 % IV SOLN
500.0000 mg/m2 | Freq: Once | INTRAVENOUS | Status: AC
  Administered 2022-04-08: 1000 mg via INTRAVENOUS
  Filled 2022-04-08: qty 40

## 2022-04-08 MED ORDER — SODIUM CHLORIDE 0.9 % IV SOLN: Freq: Once | INTRAVENOUS | Status: AC

## 2022-04-08 MED ORDER — SODIUM CHLORIDE 0.9% FLUSH
10.0000 mL | Freq: Once | INTRAVENOUS | Status: AC
  Administered 2022-04-08: 10 mL

## 2022-04-08 MED ORDER — HEPARIN SOD (PORK) LOCK FLUSH 100 UNIT/ML IV SOLN
500.0000 [IU] | Freq: Once | INTRAVENOUS | Status: AC | PRN
  Administered 2022-04-08: 500 [IU]

## 2022-04-08 MED ORDER — PROCHLORPERAZINE MALEATE 10 MG PO TABS
10.0000 mg | ORAL_TABLET | Freq: Once | ORAL | Status: AC
  Administered 2022-04-08: 10 mg via ORAL
  Filled 2022-04-08: qty 1

## 2022-04-08 MED ORDER — SODIUM CHLORIDE 0.9 % IV SOLN
200.0000 mg | Freq: Once | INTRAVENOUS | Status: AC
  Administered 2022-04-08: 200 mg via INTRAVENOUS
  Filled 2022-04-08: qty 200

## 2022-04-08 MED ORDER — SODIUM CHLORIDE 0.9% FLUSH
10.0000 mL | INTRAVENOUS | Status: DC | PRN
  Administered 2022-04-08: 10 mL

## 2022-04-08 NOTE — Patient Instructions (Signed)
[facility]COL[facility]  Discharge Instructions: Thank you for choosing [facility] to provide your oncology and hematology care.   If you have a lab appointment with the [name], please go directly to the [facility] and check in at the registration area.   Wear comfortable clothing and clothing appropriate for easy access to any Portacath or PICC line.   We strive to give you quality time with your provider. You may need to reschedule your appointment if you arrive late (15 or more minutes).  Arriving late affects you and other patients whose appointments are after yours.  Also, if you miss three or more appointments without notifying the office, you may be dismissed from the clinic at the provider's discretion.      For prescription refill requests, have your pharmacy contact our office and allow 72 hours for refills to be completed.    Today you received the following chemotherapy and/or immunotherapy agents: Keytruda/Alimta      To help prevent nausea and vomiting after your treatment, we encourage you to take your nausea medication as directed.  BELOW ARE SYMPTOMS THAT SHOULD BE REPORTED IMMEDIATELY: *FEVER GREATER THAN 100.4 F (38 C) OR HIGHER *CHILLS OR SWEATING *NAUSEA AND VOMITING THAT IS NOT CONTROLLED WITH YOUR NAUSEA MEDICATION *UNUSUAL SHORTNESS OF BREATH *UNUSUAL BRUISING OR BLEEDING *URINARY PROBLEMS (pain or burning when urinating, or frequent urination) *BOWEL PROBLEMS (unusual diarrhea, constipation, pain near the anus) TENDERNESS IN MOUTH AND THROAT WITH OR WITHOUT PRESENCE OF ULCERS (sore throat, sores in mouth, or a toothache) UNUSUAL RASH, SWELLING OR PAIN  UNUSUAL VAGINAL DISCHARGE OR ITCHING   Items with * indicate a potential emergency and should be followed up as soon as possible or go to the Emergency Department if any problems should occur.  Please show the CHEMOTHERAPY ALERT CARD or IMMUNOTHERAPY ALERT CARD at  check-in to the Emergency Department and triage nurse.  Should you have questions after your visit or need to cancel or reschedule your appointment, please contact [facility]  Dept: [phone]  and follow the prompts.  Office hours are 8:00 a.m. to 4:30 p.m. Monday - Friday. Please note that voicemails left after 4:00 p.m. may not be returned until the following business day.  We are closed weekends and major holidays. You have access to a nurse at all times for urgent questions. Please call the main number to the clinic Dept: [phone] and follow the prompts.   For any non-urgent questions, you may also contact your provider using MyChart. We now offer e-Visits for anyone 71 and older to request care online for non-urgent symptoms. For details visit [email or web address].   Also download the MyChart app! Go to the app store, search "MyChart", open the app, select [facility], and log in with your MyChart username and password.  Masks are optional in the cancer centers. If you would like for your care team to wear a mask while they are taking care of you, please let them know. You may have one support person who is at least 65 years old accompany you for your appointments.

## 2022-04-08 NOTE — Progress Notes (Signed)
[place] Telephone:([phone]) [phone]   Fax:(336) [phone]  OFFICE PROGRESS NOTE  [name], MD [place] [address]  DIAGNOSIS: Stage IV (T3, N2, M1c) non-small cell lung cancer favoring adenocarcinoma presented with right upper lobe lung mass in addition to right lower lobe, left lower lobe in addition to right hilar and mediastinal lymphadenopathy in addition to metastatic brain lesions diagnosed in October 2021.  Molecular studies by [employer]:  [id number], 9.5%,   PRIOR THERAPY: SRS to 3 brain lesion under the care of Dr. Lisbeth Renshaw.  CURRENT THERAPY: Systemic chemotherapy with carboplatin for AUC of 5, Alimta 500 mg/M2 and Keytruda 200 mg IV every 3 weeks.  First dose April 24, 2020.  Status post 34 cycles.  Starting from cycle #5 the patient will be on maintenance treatment with Alimta and Keytruda every 3 weeks.  INTERVAL HISTORY: Nicolas Allen 66 y.o. male returns to the clinic today for follow-up visit.  The patient is feeling fine today with no concerning complaints.  He denied having any chest pain, shortness of breath, cough or hemoptysis.  He denied having any nausea, vomiting, diarrhea or constipation.  He has no headache or visual changes.  He denied having any significant weight loss or night sweats.  He has no fever or chills.  He is here today for evaluation before starting cycle #35 of his treatment.   MEDICAL HISTORY: Past Medical History:  Diagnosis Date   Anemia 06/24/2012   ANXIETY 02/03/2007   Cervical disc disease 02/12/2012   S/p surgury jan 2013   Erectile dysfunction 02/11/2011   GERD (gastroesophageal reflux disease)    GLUCOSE INTOLERANCE 01/04/2008   HYPERLIPIDEMIA 02/03/2007   HYPERTENSION 02/03/2007   IBS (irritable bowel syndrome)    Impaired glucose tolerance 02/11/2011   nscl ca 03/30/2020   Smoker 08/22/2014   Substance abuse ([place]) 2001   sober for 16 yrs    ALLERGIES:  has No Known  Allergies.  MEDICATIONS:  Current Outpatient Medications  Medication Sig Dispense Refill   amLODipine-benazepril (LOTREL) 10-20 MG capsule Take 1 capsule by mouth once daily 90 capsule 3   aspirin 325 MG EC tablet Take 1 tablet (325 mg total) by mouth daily. 90 tablet 99   b complex vitamins tablet Take 1 tablet by mouth daily.     cholecalciferol (VITAMIN D3) 25 MCG (1000 UNIT) tablet Take 1,000 Units by mouth daily.     folic acid (FOLVITE) 1 MG tablet Take 1 tablet (1 mg total) by mouth daily. 90 tablet 3   Garlic 5638 MG CAPS Take 1,000 mg by mouth daily.      lidocaine-prilocaine (EMLA) cream Apply to the Port-A-Cath site 30-60 minutes before chemotherapy. 30 g 0   Omega-3 Fatty Acids (FISH OIL PO) Take 1 capsule by mouth daily.      polyethylene glycol (MIRALAX / GLYCOLAX) 17 g packet Take 17 g by mouth daily as needed.     prochlorperazine (COMPAZINE) 10 MG tablet Take 1 tablet (10 mg total) by mouth every 6 (six) hours as needed for nausea or vomiting. 30 tablet 0   rosuvastatin (CRESTOR) 40 MG tablet Take 1 tablet (40 mg total) by mouth daily. 90 tablet 3   vardenafil (LEVITRA) 20 MG tablet TAKE 1 TABLET BY MOUTH AS NEEDED FOR  ERECTILE  DYSFUNCTION 10 tablet 5   No current facility-administered medications for this visit.    SURGICAL HISTORY:  Past Surgical History:  Procedure Laterality Date   BRONCHIAL BIOPSY  03/30/2020   Procedure: BRONCHIAL BIOPSIES;  Surgeon: Garner Nash, DO;  Location: [facility];  Service: Pulmonary;;   BRONCHIAL BRUSHINGS  03/30/2020   Procedure: BRONCHIAL BRUSHINGS;  Surgeon: Garner Nash, DO;  Location: [facility];  Service: Pulmonary;;   BRONCHIAL NEEDLE ASPIRATION BIOPSY  03/30/2020   Procedure: BRONCHIAL NEEDLE ASPIRATION BIOPSIES;  Surgeon: Garner Nash, DO;  Location: [facility];  Service: Pulmonary;;   BRONCHIAL WASHINGS  03/30/2020   Procedure: BRONCHIAL WASHINGS;  Surgeon: Garner Nash, DO;  Location: [facility];   Service: Pulmonary;;   COLONOSCOPY  2007   IR IMAGING GUIDED PORT INSERTION  04/23/2020   neck fusion  2012   C4   NO PAST SURGERIES     VIDEO BRONCHOSCOPY WITH ENDOBRONCHIAL NAVIGATION N/A 03/30/2020   Procedure: VIDEO BRONCHOSCOPY WITH ENDOBRONCHIAL NAVIGATION;  Surgeon: Garner Nash, DO;  Location: [facility];  Service: Pulmonary;  Laterality: N/A;   VIDEO BRONCHOSCOPY WITH ENDOBRONCHIAL ULTRASOUND N/A 03/30/2020   Procedure: VIDEO BRONCHOSCOPY WITH ENDOBRONCHIAL ULTRASOUND;  Surgeon: Garner Nash, DO;  Location: [facility];  Service: Pulmonary;  Laterality: N/A;    REVIEW OF SYSTEMS:  A comprehensive review of systems was negative.   PHYSICAL EXAMINATION: General appearance: alert, cooperative, and no distress Head: Normocephalic, without obvious abnormality, atraumatic Neck: no adenopathy, no JVD, supple, symmetrical, trachea midline, and thyroid not enlarged, symmetric, no tenderness/mass/nodules Lymph nodes: Cervical, supraclavicular, and axillary nodes normal. Resp: clear to auscultation bilaterally Back: symmetric, no curvature. ROM normal. No CVA tenderness. Cardio: regular rate and rhythm, S1, S2 normal, no murmur, click, rub or gallop GI: soft, non-tender; bowel sounds normal; no masses,  no organomegaly Extremities: extremities normal, atraumatic, no cyanosis or edema  ECOG PERFORMANCE STATUS: 1 - Symptomatic but completely ambulatory  Blood pressure (!) 98/57, pulse 84, temperature 98.4 F (36.9 C), temperature source Oral, resp. rate 16, weight 173 lb 11.2 oz (78.8 kg), SpO2 100 %.  LABORATORY DATA: Lab Results  Component Value Date   WBC 4.6 03/18/2022   HGB 10.1 (L) 03/18/2022   HCT 29.2 (L) 03/18/2022   MCV 83.7 03/18/2022   PLT 214 03/18/2022      Chemistry      Component Value Date/Time   NA 138 03/18/2022 1031   K 3.8 03/18/2022 1031   CL 106 03/18/2022 1031   CO2 28 03/18/2022 1031   BUN 14 03/18/2022 1031   CREATININE 1.07 03/18/2022  1031   CREATININE 0.88 02/21/2020 1115      Component Value Date/Time   CALCIUM 8.8 (L) 03/18/2022 1031   ALKPHOS 48 03/18/2022 1031   AST 25 03/18/2022 1031   ALT 18 03/18/2022 1031   BILITOT 0.3 03/18/2022 1031       RADIOGRAPHIC STUDIES: No results found.   ASSESSMENT AND PLAN: This is a very pleasant 66 years old African-American male recently diagnosed with stage IV (T3, N2, M1c)  non-small cell lung cancer, adenocarcinoma presented with right upper lobe lung mass in addition to right lower lobe, left lower lobe in addition to right hilar and mediastinal lymphadenopathy in addition to metastatic brain lesions diagnosed in October 2021. The patient has molecular studies by [employer] that showed no actionable mutations. He underwent SRS treatment to his brain lesion. The patient is currently undergoing systemic chemotherapy with carboplatin for AUC of 5, Alimta 500 mg/M2 and Keytruda 200 mg IV every 3 weeks status post 34 cycles.  Starting from cycle #5  the patient is on maintenance treatment with Alimta and Keytruda every 3 weeks. The patient has been tolerating this treatment well with no concerning adverse effects. I recommended for him to proceed with cycle #35 today as planned. I will see him back for follow-up visit in 3 weeks for evaluation with repeat CT scan of the chest, abdomen and pelvis for restaging of his disease. The patient was advised to call immediately if he has any other concerning symptoms in the interval. The patient voices understanding of current disease status and treatment options and is in agreement with the current care plan.  All questions were answered. The patient knows to call the clinic with any problems, questions or concerns. We can certainly see the patient much sooner if necessary.  Disclaimer: This note was dictated with voice recognition software. Similar sounding words can inadvertently be transcribed and may not be corrected upon  review.

## 2022-04-14 ENCOUNTER — Other Ambulatory Visit: Payer: Self-pay | Admitting: Internal Medicine

## 2022-04-18 ENCOUNTER — Other Ambulatory Visit: Payer: Self-pay | Admitting: Radiation Therapy

## 2022-04-18 DIAGNOSIS — C7931 Secondary malignant neoplasm of brain: Secondary | ICD-10-CM

## 2022-04-25 ENCOUNTER — Ambulatory Visit (HOSPITAL_COMMUNITY)
Admission: RE | Admit: 2022-04-25 | Discharge: 2022-04-25 | Disposition: A | Discharge status: Home / Self Care | Payer: Medicare Other | Source: Ambulatory Visit | Attending: Internal Medicine | Admitting: Internal Medicine

## 2022-04-25 ENCOUNTER — Encounter: Payer: Self-pay | Admitting: Internal Medicine

## 2022-04-25 DIAGNOSIS — N3289 Other specified disorders of bladder: Secondary | ICD-10-CM | POA: Diagnosis not present

## 2022-04-25 DIAGNOSIS — C3490 Malignant neoplasm of unspecified part of unspecified bronchus or lung: Secondary | ICD-10-CM | POA: Diagnosis not present

## 2022-04-25 DIAGNOSIS — J439 Emphysema, unspecified: Secondary | ICD-10-CM | POA: Diagnosis not present

## 2022-04-25 DIAGNOSIS — J90 Pleural effusion, not elsewhere classified: Secondary | ICD-10-CM | POA: Diagnosis not present

## 2022-04-25 MED ORDER — HEPARIN SOD (PORK) LOCK FLUSH 100 UNIT/ML IV SOLN
500.0000 [IU] | Freq: Once | INTRAVENOUS | Status: AC
  Administered 2022-04-25: 500 [IU] via INTRAVENOUS

## 2022-04-25 MED ORDER — SODIUM CHLORIDE (PF) 0.9 % IJ SOLN
INTRAMUSCULAR | Status: AC
  Filled 2022-04-25: qty 50

## 2022-04-25 MED ORDER — HEPARIN SOD (PORK) LOCK FLUSH 100 UNIT/ML IV SOLN
INTRAVENOUS | Status: AC
  Filled 2022-04-25: qty 5

## 2022-04-25 MED ORDER — IOHEXOL 300 MG/ML  SOLN
100.0000 mL | Freq: Once | INTRAMUSCULAR | Status: AC | PRN
  Administered 2022-04-25: 100 mL via INTRAVENOUS

## 2022-04-25 NOTE — Progress Notes (Unsigned)
[name] OFFICE PROGRESS NOTE  Nicolas Borg, MD [place] [id number]  DIAGNOSIS: Stage IV (T3, N2, M1c) non-small cell lung cancer favoring adenocarcinoma presented with right upper lobe lung mass in addition to right lower lobe, left lower lobe in addition to right hilar and mediastinal lymphadenopathy in addition to metastatic brain lesions diagnosed in October 2021.   Molecular studies by [employer]:   [id number], 9.5%,  PRIOR THERAPY: 1) SRS to 3 brain lesion under the care of Dr. Lisbeth Allen.  Completed on April 20, 2020 2) SRS to the recurrent brain lesion under the care of Dr. Lisbeth Allen. Completed on 02/12/22  CURRENT THERAPY: 1) Systemic chemotherapy with carboplatin for AUC of 5, Alimta 500 mg/M2 and Keytruda 200 mg IV every 3 weeks.  First dose April 24, 2020.  Status post 35 cycles.  Starting from cycle #5 the patient will be on maintenance treatment with Alimta and Keytruda every 3 weeks. 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 This was resumed starting from cycle #24.  ***Removing Keytruda***  INTERVAL HISTORY: Nicolas Allen 66 y.o. male returns  to the clinic today for a follow-up visit. The patient is feeling fairly well today without any concerning complaints.  He is currently undergoing treatment with alimta and [place]. 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 This was resumed from cycle #24. He tolerated his last cycle well without any concerning adverse side effects except he had severe constipation in the interval since last being seen. He is taking a stool softener daily at this time.  He denies any fever, chills, night sweats, or unexplained weight loss.  Denies any nausea, diarrhea, or vomiting.  Denies any chest pain, shortness of breath, or hemoptysis.  Denies any significant cough.  Denies any headache or visual changes. He completed SRS  re-treatment to the brain lesion under the care of Dr. Lisbeth Allen on 02/12/22. He tolerated that well.  He recently had a restaging CT scan. He is here for evaluation and repeat blood work before starting cycle #36    MEDICAL HISTORY: Past Medical History:  Diagnosis Date   Anemia 06/24/2012   ANXIETY 02/03/2007   Cervical disc disease 02/12/2012   S/p surgury jan 2013   Erectile dysfunction 02/11/2011   GERD (gastroesophageal reflux disease)    GLUCOSE INTOLERANCE 01/04/2008   HYPERLIPIDEMIA 02/03/2007   HYPERTENSION 02/03/2007   IBS (irritable bowel syndrome)    Impaired glucose tolerance 02/11/2011   nscl ca 03/30/2020   Smoker 08/22/2014   Substance abuse ([place]) 2001   sober for 16 yrs    ALLERGIES:  has No Known Allergies.  MEDICATIONS:  Current Outpatient Medications  Medication Sig Dispense Refill   amLODipine-benazepril (LOTREL) 10-20 MG capsule Take 1 capsule by mouth once daily 90 capsule 3   aspirin 325 MG EC tablet Take 1 tablet (325 mg total) by mouth daily. 90 tablet 99   b complex vitamins tablet Take 1 tablet by mouth daily.     cholecalciferol (VITAMIN D3) 25 MCG (1000 UNIT) tablet Take 1,000 Units by mouth daily.     folic acid (FOLVITE) 1 MG tablet Take 1 tablet by mouth once daily 90 tablet 3   Garlic 4332 MG CAPS Take 1,000 mg by mouth daily.      lidocaine-prilocaine (EMLA) cream Apply to the Port-A-Cath site 30-60 minutes before chemotherapy. 30 g 0   Omega-3  Fatty Acids (FISH OIL PO) Take 1 capsule by mouth daily.      polyethylene glycol (MIRALAX / GLYCOLAX) 17 g packet Take 17 g by mouth daily as needed.     prochlorperazine (COMPAZINE) 10 MG tablet Take 1 tablet (10 mg total) by mouth every 6 (six) hours as needed for nausea or vomiting. 30 tablet 0   rosuvastatin (CRESTOR) 40 MG tablet Take 1 tablet (40 mg total) by mouth daily. 90 tablet 3   vardenafil (LEVITRA) 20 MG tablet TAKE 1 TABLET BY MOUTH AS NEEDED FOR  ERECTILE  DYSFUNCTION 10 tablet 5   No  current facility-administered medications for this visit.   Facility-Administered Medications Ordered in Other Visits  Medication Dose Route Frequency Provider Last Rate Last Admin   heparin lock flush 100 UNIT/ML injection            sodium chloride (PF) 0.9 % injection             SURGICAL HISTORY:  Past Surgical History:  Procedure Laterality Date   BRONCHIAL BIOPSY  03/30/2020   Procedure: BRONCHIAL BIOPSIES;  Surgeon: Garner Nash, DO;  Location: [facility];  Service: Pulmonary;;   BRONCHIAL BRUSHINGS  03/30/2020   Procedure: BRONCHIAL BRUSHINGS;  Surgeon: Garner Nash, DO;  Location: [facility];  Service: Pulmonary;;   BRONCHIAL NEEDLE ASPIRATION BIOPSY  03/30/2020   Procedure: BRONCHIAL NEEDLE ASPIRATION BIOPSIES;  Surgeon: Garner Nash, DO;  Location: [facility];  Service: Pulmonary;;   BRONCHIAL WASHINGS  03/30/2020   Procedure: BRONCHIAL WASHINGS;  Surgeon: Garner Nash, DO;  Location: [facility];  Service: Pulmonary;;   COLONOSCOPY  2007   IR IMAGING GUIDED PORT INSERTION  04/23/2020   neck fusion  2012   C4   NO PAST SURGERIES     VIDEO BRONCHOSCOPY WITH ENDOBRONCHIAL NAVIGATION N/A 03/30/2020   Procedure: VIDEO BRONCHOSCOPY WITH ENDOBRONCHIAL NAVIGATION;  Surgeon: Garner Nash, DO;  Location: [facility];  Service: Pulmonary;  Laterality: N/A;   VIDEO BRONCHOSCOPY WITH ENDOBRONCHIAL ULTRASOUND N/A 03/30/2020   Procedure: VIDEO BRONCHOSCOPY WITH ENDOBRONCHIAL ULTRASOUND;  Surgeon: Garner Nash, DO;  Location: [facility];  Service: Pulmonary;  Laterality: N/A;    REVIEW OF SYSTEMS:   Review of Systems  Constitutional: Negative for appetite change, chills, fatigue, fever and unexpected weight change.  HENT:   Negative for mouth sores, nosebleeds, sore throat and trouble swallowing.   Eyes: Negative for eye problems and icterus.  Respiratory: Negative for cough, hemoptysis, shortness of breath and wheezing.   Cardiovascular: Negative for  chest pain and leg swelling.  Gastrointestinal: Negative for abdominal pain, constipation, diarrhea, nausea and vomiting.  Genitourinary: Negative for bladder incontinence, difficulty urinating, dysuria, frequency and hematuria.   Musculoskeletal: Negative for back pain, gait problem, neck pain and neck stiffness.  Skin: Negative for itching and rash.  Neurological: Negative for dizziness, extremity weakness, gait problem, headaches, light-headedness and seizures.  Hematological: Negative for adenopathy. Does not bruise/bleed easily.  Psychiatric/Behavioral: Negative for confusion, depression and sleep disturbance. The patient is not nervous/anxious.     PHYSICAL EXAMINATION:  There were no vitals taken for this visit.  ECOG PERFORMANCE STATUS: {CHL ONC ECOG [id number]  Physical Exam  Constitutional: Oriented to person, place, and time and well-developed, well-nourished, and in no distress. No distress.  HENT:  Head: Normocephalic and atraumatic.  Mouth/Throat: Oropharynx is clear and moist. No oropharyngeal exudate.  Eyes: Conjunctivae are normal. Right eye exhibits no discharge. Left eye exhibits no discharge. No scleral  icterus.  Neck: Normal range of motion. Neck supple.  Cardiovascular: Normal rate, regular rhythm, normal heart sounds and intact distal pulses.   Pulmonary/Chest: Effort normal and breath sounds normal. No respiratory distress. No wheezes. No rales.  Abdominal: Soft. Bowel sounds are normal. Exhibits no distension and no mass. There is no tenderness.  Musculoskeletal: Normal range of motion. Exhibits no edema.  Lymphadenopathy:    No cervical adenopathy.  Neurological: Alert and oriented to person, place, and time. Exhibits normal muscle tone. Gait normal. Coordination normal.  Skin: Skin is warm and dry. No rash noted. Not diaphoretic. No erythema. No pallor.  Psychiatric: Mood, memory and judgment normal.  Vitals reviewed.  LABORATORY DATA: Lab Results   Component Value Date   WBC 4.3 04/08/2022   HGB 10.5 (L) 04/08/2022   HCT 30.5 (L) 04/08/2022   MCV 85.4 04/08/2022   PLT 191 04/08/2022      Chemistry      Component Value Date/Time   NA 138 04/08/2022 1051   K 3.8 04/08/2022 1051   CL 107 04/08/2022 1051   CO2 27 04/08/2022 1051   BUN 13 04/08/2022 1051   CREATININE 1.22 04/08/2022 1051   CREATININE 0.88 02/21/2020 1115      Component Value Date/Time   CALCIUM 8.8 (L) 04/08/2022 1051   ALKPHOS 44 04/08/2022 1051   AST 23 04/08/2022 1051   ALT 17 04/08/2022 1051   BILITOT 0.3 04/08/2022 1051       RADIOGRAPHIC STUDIES:  No results found.   ASSESSMENT/PLAN:  This is a very pleasant 66 year old African-American male diagnosed with stage IV (T3, N2, M1C) non-small cell lung cancer, adenocarcinoma.  He presented with a right upper lobe lung mass in addition to a right lower lobe, left lower lobe lung lesions with right hilar and mediastinal lymphadenopathy.  He also had metastatic disease to the brain.  He was diagnosed in October 2022.  His molecular studies by [employer] did not show any actionable mutations.   The patient underwent SRS treatment to the metastatic brain lesions under the care of Dr. Lisbeth Allen.  This was completed on 04/20/2021.    He completed re-treatment SRS to the progressive brain lesion in the cerebellum under the care of Dr. Lisbeth Allen on 02/12/22.   The patient is currently undergoing systemic chemotherapy/immunotherapy.  He started with carboplatin for an AUC of 5, Alimta 500 mg per metered squared, Keytruda 200 mg IV every 3 weeks.  He is status post 35 cycles.  Starting from cycle #5, the patient has been on maintenance treatment with Alimta and Keytruda IV every 3 weeks. Starting from cycle #22, the patient has been on single agent alimta. Nicolas Allen has been on hold due to CT findings of suspicious immunotherapy mediated enteritis. Nicolas Allen was resumed with cycle #24 and he has been tolerating it well.     The patient had a restaging CT scan performed.  Mohamed personally and independently reviewed the scan and discussed results with the patient today.  The scan showed ***  Keytruda?  Labs were reviewed.  Recommend that he proceed with cycle #36 today as schedule.  We will see him back for follow-up visit and repeat blood work in 3 weeks before starting cycle #37.    The patient was advised to call immediately if he has any concerning symptoms in the interval. The patient voices understanding of current disease status and treatment options and is in agreement with the current care plan. All questions were answered. The patient  knows to call the clinic with any problems, questions or concerns. We can certainly see the patient much sooner if necessary    No orders of the defined types were placed in this encounter.    I spent {CHL ONC TIME VISIT - [id number]} counseling the patient face to face. The total time spent in the appointment was {CHL ONC TIME VISIT - [id number]}.  Ladislaus Repsher L Sibyl Mikula, PA-C 04/25/22

## 2022-04-29 ENCOUNTER — Inpatient Hospital Stay: Payer: Medicare Other

## 2022-04-29 ENCOUNTER — Other Ambulatory Visit: Payer: Self-pay

## 2022-04-29 ENCOUNTER — Inpatient Hospital Stay: Payer: Medicare Other | Attending: Internal Medicine | Admitting: Physician Assistant

## 2022-04-29 VITALS — BP 105/62 | HR 76 | Temp 98.0°F | Resp 18 | Wt 175.2 lb

## 2022-04-29 DIAGNOSIS — C3491 Malignant neoplasm of unspecified part of right bronchus or lung: Secondary | ICD-10-CM

## 2022-04-29 DIAGNOSIS — Z87891 Personal history of nicotine dependence: Secondary | ICD-10-CM | POA: Diagnosis not present

## 2022-04-29 DIAGNOSIS — C3431 Malignant neoplasm of lower lobe, right bronchus or lung: Secondary | ICD-10-CM | POA: Insufficient documentation

## 2022-04-29 DIAGNOSIS — C7931 Secondary malignant neoplasm of brain: Secondary | ICD-10-CM | POA: Diagnosis not present

## 2022-04-29 DIAGNOSIS — Z5112 Encounter for antineoplastic immunotherapy: Secondary | ICD-10-CM | POA: Diagnosis not present

## 2022-04-29 DIAGNOSIS — C3411 Malignant neoplasm of upper lobe, right bronchus or lung: Secondary | ICD-10-CM | POA: Diagnosis not present

## 2022-04-29 DIAGNOSIS — Z79899 Other long term (current) drug therapy: Secondary | ICD-10-CM | POA: Diagnosis not present

## 2022-04-29 DIAGNOSIS — Z95828 Presence of other vascular implants and grafts: Secondary | ICD-10-CM

## 2022-04-29 DIAGNOSIS — Z5111 Encounter for antineoplastic chemotherapy: Secondary | ICD-10-CM | POA: Diagnosis not present

## 2022-04-29 LAB — CMP (CANCER CENTER ONLY)
ALT: 16 U/L (ref 0–44)
AST: 24 U/L (ref 15–41)
Albumin: 3.3 g/dL — ABNORMAL LOW (ref 3.5–5.0)
Alkaline Phosphatase: 46 U/L (ref 38–126)
Anion gap: 5 (ref 5–15)
BUN: 13 mg/dL (ref 8–23)
CO2: 27 mmol/L (ref 22–32)
Calcium: 8.6 mg/dL — ABNORMAL LOW (ref 8.9–10.3)
Chloride: 106 mmol/L (ref 98–111)
Creatinine: 1.34 mg/dL — ABNORMAL HIGH (ref 0.61–1.24)
GFR, Estimated: 59 mL/min — ABNORMAL LOW (ref >60)
Glucose, Bld: 162 mg/dL — ABNORMAL HIGH (ref 70–99)
Potassium: 3.5 mmol/L (ref 3.5–5.1)
Sodium: 138 mmol/L (ref 135–145)
Total Bilirubin: 0.3 mg/dL (ref 0.3–1.2)
Total Protein: 6.5 g/dL (ref 6.5–8.1)

## 2022-04-29 LAB — CBC WITH DIFFERENTIAL (CANCER CENTER ONLY)
Abs Immature Granulocytes: 0.01 10*3/uL (ref 0.00–0.07)
Basophils Absolute: 0 10*3/uL (ref 0.0–0.1)
Basophils Relative: 0 %
Eosinophils Absolute: 0.2 10*3/uL (ref 0.0–0.5)
Eosinophils Relative: 4 %
HCT: 30.4 % — ABNORMAL LOW (ref 39.0–52.0)
Hemoglobin: 10.3 g/dL — ABNORMAL LOW (ref 13.0–17.0)
Immature Granulocytes: 0 %
Lymphocytes Relative: 35 %
Lymphs Abs: 1.6 10*3/uL (ref 0.7–4.0)
MCH: 29.5 pg (ref 26.0–34.0)
MCHC: 33.9 g/dL (ref 30.0–36.0)
MCV: 87.1 fL (ref 80.0–100.0)
Monocytes Absolute: 0.6 10*3/uL (ref 0.1–1.0)
Monocytes Relative: 13 %
Neutro Abs: 2.2 10*3/uL (ref 1.7–7.7)
Neutrophils Relative %: 48 %
Platelet Count: 184 10*3/uL (ref 150–400)
RBC: 3.49 MIL/uL — ABNORMAL LOW (ref 4.22–5.81)
RDW: 17.5 % — ABNORMAL HIGH (ref 11.5–15.5)
WBC Count: 4.6 10*3/uL (ref 4.0–10.5)
nRBC: 0 % (ref 0.0–0.2)

## 2022-04-29 MED ORDER — SODIUM CHLORIDE 0.9 % IV SOLN
500.0000 mg/m2 | Freq: Once | INTRAVENOUS | Status: AC
  Administered 2022-04-29: 1000 mg via INTRAVENOUS
  Filled 2022-04-29: qty 40

## 2022-04-29 MED ORDER — SODIUM CHLORIDE 0.9% FLUSH
10.0000 mL | Freq: Once | INTRAVENOUS | Status: AC
  Administered 2022-04-29: 10 mL

## 2022-04-29 MED ORDER — SODIUM CHLORIDE 0.9 % IV SOLN: Freq: Once | INTRAVENOUS | Status: AC

## 2022-04-29 MED ORDER — HEPARIN SOD (PORK) LOCK FLUSH 100 UNIT/ML IV SOLN
500.0000 [IU] | Freq: Once | INTRAVENOUS | Status: AC | PRN
  Administered 2022-04-29: 500 [IU]

## 2022-04-29 MED ORDER — SODIUM CHLORIDE 0.9 % IV SOLN
200.0000 mg | Freq: Once | INTRAVENOUS | Status: AC
  Administered 2022-04-29: 200 mg via INTRAVENOUS
  Filled 2022-04-29: qty 200

## 2022-04-29 MED ORDER — CYANOCOBALAMIN 1000 MCG/ML IJ SOLN
1000.0000 ug | Freq: Once | INTRAMUSCULAR | Status: AC
  Administered 2022-04-29: 1000 ug via INTRAMUSCULAR
  Filled 2022-04-29: qty 1

## 2022-04-29 MED ORDER — SODIUM CHLORIDE 0.9% FLUSH
10.0000 mL | INTRAVENOUS | Status: DC | PRN
  Administered 2022-04-29: 10 mL

## 2022-04-29 MED ORDER — PROCHLORPERAZINE MALEATE 10 MG PO TABS
10.0000 mg | ORAL_TABLET | Freq: Once | ORAL | Status: AC
  Administered 2022-04-29: 10 mg via ORAL
  Filled 2022-04-29: qty 1

## 2022-04-29 NOTE — Patient Instructions (Signed)
[facility] ONCOLOGY  Discharge Instructions: Thank you for choosing [facility] to provide your oncology and hematology care.   If you have a lab appointment with the [facility], please go directly to the [facility] and check in at the registration area.   Wear comfortable clothing and clothing appropriate for easy access to any Portacath or PICC line.   We strive to give you quality time with your provider. You may need to reschedule your appointment if you arrive late (15 or more minutes).  Arriving late affects you and other patients whose appointments are after yours.  Also, if you miss three or more appointments without notifying the office, you may be dismissed from the clinic at the provider's discretion.      For prescription refill requests, have your pharmacy contact our office and allow 72 hours for refills to be completed.    Today you received the following chemotherapy and/or immunotherapy agents: pembrolizumab and pemetrexed      To help prevent nausea and vomiting after your treatment, we encourage you to take your nausea medication as directed.  BELOW ARE SYMPTOMS THAT SHOULD BE REPORTED IMMEDIATELY: *FEVER GREATER THAN 100.4 F (38 C) OR HIGHER *CHILLS OR SWEATING *NAUSEA AND VOMITING THAT IS NOT CONTROLLED WITH YOUR NAUSEA MEDICATION *UNUSUAL SHORTNESS OF BREATH *UNUSUAL BRUISING OR BLEEDING *URINARY PROBLEMS (pain or burning when urinating, or frequent urination) *BOWEL PROBLEMS (unusual diarrhea, constipation, pain near the anus) TENDERNESS IN MOUTH AND THROAT WITH OR WITHOUT PRESENCE OF ULCERS (sore throat, sores in mouth, or a toothache) UNUSUAL RASH, SWELLING OR PAIN  UNUSUAL VAGINAL DISCHARGE OR ITCHING   Items with * indicate a potential emergency and should be followed up as soon as possible or go to the Emergency Department if any problems should occur.  Please show the CHEMOTHERAPY ALERT CARD or IMMUNOTHERAPY ALERT  CARD at check-in to the Emergency Department and triage nurse.  Should you have questions after your visit or need to cancel or reschedule your appointment, please contact [facility]  Dept: [phone]  and follow the prompts.  Office hours are 8:00 a.m. to 4:30 p.m. Monday - Friday. Please note that voicemails left after 4:00 p.m. may not be returned until the following business day.  We are closed weekends and major holidays. You have access to a nurse at all times for urgent questions. Please call the main number to the clinic Dept: [phone] and follow the prompts.   For any non-urgent questions, you may also contact your provider using MyChart. We now offer e-Visits for anyone 3 and older to request care online for non-urgent symptoms. For details visit [email or web address].   Also download the MyChart app! Go to the app store, search "MyChart", open the app, select [facility], and log in with your MyChart username and password.  Masks are optional in the cancer centers. If you would like for your care team to wear a mask while they are taking care of you, please let them know. You may have one support person who is at least 65 years old accompany you for your appointments.

## 2022-05-15 ENCOUNTER — Ambulatory Visit
Admission: RE | Admit: 2022-05-15 | Discharge: 2022-05-15 | Disposition: A | Discharge status: Home / Self Care | Payer: Medicare Other | Source: Ambulatory Visit | Attending: Radiation Oncology | Admitting: Radiation Oncology

## 2022-05-15 DIAGNOSIS — C7931 Secondary malignant neoplasm of brain: Secondary | ICD-10-CM | POA: Diagnosis not present

## 2022-05-15 MED ORDER — GADOPICLENOL 0.5 MMOL/ML IV SOLN
7.0000 mL | Freq: Once | INTRAVENOUS | Status: AC | PRN
  Administered 2022-05-15: 7 mL via INTRAVENOUS

## 2022-05-15 MED ORDER — SODIUM CHLORIDE 0.9% FLUSH
10.0000 mL | INTRAVENOUS | Status: DC | PRN
  Administered 2022-05-15: 10 mL via INTRAVENOUS

## 2022-05-15 MED ORDER — HEPARIN SOD (PORK) LOCK FLUSH 100 UNIT/ML IV SOLN
500.0000 [IU] | Freq: Once | INTRAVENOUS | Status: AC
  Administered 2022-05-15: 500 [IU] via INTRAVENOUS

## 2022-05-19 ENCOUNTER — Ambulatory Visit
Admission: RE | Admit: 2022-05-19 | Discharge: 2022-05-19 | Disposition: A | Discharge status: Home / Self Care | Payer: Medicare Other | Source: Ambulatory Visit | Attending: Radiation Oncology | Admitting: Radiation Oncology

## 2022-05-19 ENCOUNTER — Inpatient Hospital Stay: Payer: Medicare Other | Attending: Internal Medicine

## 2022-05-19 DIAGNOSIS — C3411 Malignant neoplasm of upper lobe, right bronchus or lung: Secondary | ICD-10-CM | POA: Insufficient documentation

## 2022-05-19 DIAGNOSIS — Z79899 Other long term (current) drug therapy: Secondary | ICD-10-CM | POA: Insufficient documentation

## 2022-05-19 DIAGNOSIS — Z5111 Encounter for antineoplastic chemotherapy: Secondary | ICD-10-CM | POA: Insufficient documentation

## 2022-05-19 DIAGNOSIS — Z923 Personal history of irradiation: Secondary | ICD-10-CM | POA: Insufficient documentation

## 2022-05-19 DIAGNOSIS — C7931 Secondary malignant neoplasm of brain: Secondary | ICD-10-CM | POA: Insufficient documentation

## 2022-05-19 DIAGNOSIS — C3491 Malignant neoplasm of unspecified part of right bronchus or lung: Secondary | ICD-10-CM

## 2022-05-19 DIAGNOSIS — C3431 Malignant neoplasm of lower lobe, right bronchus or lung: Secondary | ICD-10-CM | POA: Insufficient documentation

## 2022-05-19 DIAGNOSIS — Z87891 Personal history of nicotine dependence: Secondary | ICD-10-CM | POA: Insufficient documentation

## 2022-05-19 DIAGNOSIS — Z5112 Encounter for antineoplastic immunotherapy: Secondary | ICD-10-CM | POA: Insufficient documentation

## 2022-05-19 DIAGNOSIS — C3482 Malignant neoplasm of overlapping sites of left bronchus and lung: Secondary | ICD-10-CM | POA: Diagnosis not present

## 2022-05-19 DIAGNOSIS — C3481 Malignant neoplasm of overlapping sites of right bronchus and lung: Secondary | ICD-10-CM | POA: Diagnosis not present

## 2022-05-19 NOTE — Progress Notes (Signed)
Radiation Oncology         [phone] ________________________________  Outpatient Follow Up - Conducted via telephone at patient request.  I spoke with the patient to conduct this consult visit via telephone. The patient was notified in advance and was offered an in person or telemedicine meeting to allow for face to face communication but instead preferred to proceed with a telephone visit.    Name: Nicolas Allen        MRN: [id number]  Date of Service: 05/19/2022 DOB: 05-29-1956  VQ:[id number], Hunt Oris, MD  [name], MD     REFERRING PHYSICIAN: [name], MD   DIAGNOSIS: There were no encounter diagnoses.   HISTORY OF PRESENT ILLNESS: Nicolas Allen is a 66 y.o. male with a history of stage IV non-small cell lung cancer, adenocarcinoma involving multifocal disease in the lung and 3 brain metastases. He was diagnosed in the fall of 2021 and found to have metastatic disease in the lung and brain for which he has received stereotactic radiosurgery [facility]) with Dr. Lisbeth Renshaw and has continued on chemo/immunotherapy with Dr. Julien Nordmann.    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 He has been followed since and tolerated therapy well. He remains on immunotherapy consolidation with Dr. Julien Nordmann. His most recent MRI on 05/15/22 showed the lesion measuring 12 mm, previously 10 mm. He is contacted by phone to review these results.   PREVIOUS RADIATION THERAPY:  02/12/2022 through 02/12/2022 SRS Treatment Site Technique Total Dose (Gy) Dose per Fx (Gy) Completed Fx Beam Energies  Brain:  PTV_2_Retreat_4mm Mid Cerebellum IMRT 20/20 20 1/1 6XFFF    04/20/20 SRS Treatment: Each site below was treated to 20 Gy in 1 fraction PTV1 Rt Cerebellum 30mm PTV2Mid cerebellum  33mm PTV3Lt Parietal 33mm    PAST MEDICAL HISTORY:  Past Medical History:  Diagnosis Date   Anemia 06/24/2012   ANXIETY 02/03/2007   Cervical disc disease 02/12/2012   S/p surgury jan 2013   Erectile dysfunction 02/11/2011   GERD (gastroesophageal reflux disease)    GLUCOSE INTOLERANCE 01/04/2008   HYPERLIPIDEMIA 02/03/2007   HYPERTENSION 02/03/2007   IBS (irritable bowel syndrome)    Impaired glucose tolerance 02/11/2011   nscl ca 03/30/2020   Smoker 08/22/2014   Substance abuse ([place]) 2001   sober for 16 yrs       PAST SURGICAL HISTORY: Past Surgical History:  Procedure Laterality Date   BRONCHIAL BIOPSY  03/30/2020   Procedure: BRONCHIAL BIOPSIES;  Surgeon: Garner Nash, DO;  Location: [facility];  Service: Pulmonary;;   BRONCHIAL BRUSHINGS  03/30/2020   Procedure: BRONCHIAL BRUSHINGS;  Surgeon: Garner Nash, DO;  Location: [place] [facility];  Service: Pulmonary;;   BRONCHIAL NEEDLE ASPIRATION BIOPSY  03/30/2020   Procedure: BRONCHIAL NEEDLE ASPIRATION BIOPSIES;  Surgeon: Garner Nash, DO;  Location: [facility] [facility];  Service: Pulmonary;;   BRONCHIAL WASHINGS  03/30/2020   Procedure: BRONCHIAL WASHINGS;  Surgeon: Garner Nash, DO;  Location: [place];  Service: Pulmonary;;   COLONOSCOPY  2007   IR IMAGING GUIDED PORT INSERTION  04/23/2020   neck fusion  2012   C4   NO PAST SURGERIES     VIDEO BRONCHOSCOPY WITH ENDOBRONCHIAL NAVIGATION N/A 03/30/2020   Procedure: VIDEO BRONCHOSCOPY WITH ENDOBRONCHIAL NAVIGATION;  Surgeon:  Garner Nash, DO;  Location: [facility];  Service: Pulmonary;  Laterality: N/A;   VIDEO BRONCHOSCOPY WITH ENDOBRONCHIAL ULTRASOUND N/A 03/30/2020   Procedure: VIDEO BRONCHOSCOPY WITH ENDOBRONCHIAL ULTRASOUND;  Surgeon: Garner Nash, DO;  Location: [place];  Service: Pulmonary;  Laterality: N/A;     FAMILY HISTORY:  Family History  Problem Relation Age of Onset   Cancer Mother        esophagus   Cancer Cousin     Stroke Other    Hypertension Other    Colon cancer Neg Hx      SOCIAL HISTORY:  reports that he quit smoking about 2 years ago. His smoking use included cigarettes. He has a 45.00 pack-year smoking history. He has quit using smokeless tobacco.  His smokeless tobacco use included chew. He reports that he does not drink alcohol and does not use drugs. The patient is married and resides in [place]. He enjoys fishing and throwing horseshoes and competes in local tournaments between April and October.    ALLERGIES: Patient has no known allergies.   MEDICATIONS:  Current Outpatient Medications  Medication Sig Dispense Refill   amLODipine-benazepril (LOTREL) 10-20 MG capsule Take 1 capsule by mouth once daily 90 capsule 3   aspirin 325 MG EC tablet Take 1 tablet (325 mg total) by mouth daily. 90 tablet 99   b complex vitamins tablet Take 1 tablet by mouth daily.     cholecalciferol (VITAMIN D3) 25 MCG (1000 UNIT) tablet Take 1,000 Units by mouth daily.     folic acid (FOLVITE) 1 MG tablet Take 1 tablet by mouth once daily 90 tablet 3   Garlic 1443 MG CAPS Take 1,000 mg by mouth daily.      lidocaine-prilocaine (EMLA) cream Apply to the Port-A-Cath site 30-60 minutes before chemotherapy. 30 g 0   Omega-3 Fatty Acids (FISH OIL PO) Take 1 capsule by mouth daily.      polyethylene glycol (MIRALAX / GLYCOLAX) 17 g packet Take 17 g by mouth daily as needed.     prochlorperazine (COMPAZINE) 10 MG tablet Take 1 tablet (10 mg total) by mouth every 6 (six) hours as needed for nausea or vomiting. 30 tablet 0   rosuvastatin (CRESTOR) 40 MG tablet Take 1 tablet (40 mg total) by mouth daily. 90 tablet 3   vardenafil (LEVITRA) 20 MG tablet TAKE 1 TABLET BY MOUTH AS NEEDED FOR  ERECTILE  DYSFUNCTION 10 tablet 5   No current facility-administered medications for this encounter.     REVIEW OF SYSTEMS: On review of systems, the patient reports he is doing well overall. He reports some colon issues he was  having with immunotherapy have resolved. He also denies headaches, dizziness, nausea, vomiting, or changes in movement. No other complaints are verbalized.   PHYSICAL EXAM:  Unable to assess given encounter type.    ECOG = 0  0 - Asymptomatic (Fully active, able to carry on all predisease activities without restriction)  1 - Symptomatic but completely ambulatory (Restricted in physically strenuous activity but ambulatory and able to carry out work of a light or sedentary nature. For example, light housework, office work)  2 - Symptomatic, <50% in bed during the day (Ambulatory and capable of all self care but unable to carry out any work activities. Up and about more than 50% of waking hours)  3 - Symptomatic, >50% in bed, but not bedbound (Capable of only limited self-care, confined to bed or chair 50% or more of waking  hours)  4 - Bedbound (Completely disabled. Cannot carry on any self-care. Totally confined to bed or chair)  5 - Death   Eustace Pen MM, Creech RH, Tormey DC, et al. [phone]). "Toxicity and response criteria of the [facility]". [place] Oncol. 5 (6): 649-55    LABORATORY DATA:  Lab Results  Component Value Date   WBC 4.6 04/29/2022   HGB 10.3 (L) 04/29/2022   HCT 30.4 (L) 04/29/2022   MCV 87.1 04/29/2022   PLT 184 04/29/2022   Lab Results  Component Value Date   NA 138 04/29/2022   K 3.5 04/29/2022   CL 106 04/29/2022   CO2 27 04/29/2022   Lab Results  Component Value Date   ALT 16 04/29/2022   AST 24 04/29/2022   ALKPHOS 46 04/29/2022   BILITOT 0.3 04/29/2022      RADIOGRAPHY: MR Brain W Wo Contrast  Result Date: 05/16/2022 CLINICAL DATA:  SRS restaging.  Lung carcinoma metastatic to brain EXAM: MRI HEAD WITHOUT AND WITH CONTRAST TECHNIQUE: Multiplanar, multiecho pulse sequences of the brain and surrounding structures were obtained without and with intravenous contrast. CONTRAST:  7 mL gadopiclenol (Vueway) 0.5 mmol/ml solution  COMPARISON:  01/29/2022 FINDINGS: Brain: No acute infarct, mass effect or extra-axial collection. No acute or chronic hemorrhage. There is multifocal hyperintense T2-weighted signal within the white matter. Generalized volume loss. Continued slight increase in size of midline cerebellar metastasis, now 12 mm, previously 10 mm (series 13, image 57). No new metastases. Vascular: Major flow voids are preserved. Skull and upper cervical spine: Normal calvarium and skull base. Visualized upper cervical spine and soft tissues are normal. Sinuses/Orbits:No paranasal sinus fluid levels or advanced mucosal thickening. No mastoid or middle ear effusion. Normal orbits. Unchanged right parotid nodule. IMPRESSION: 1. Continued slight increase in size of midline cerebellar metastasis, now 12 mm, previously 10 mm. 2. No new metastases. Electronically Signed   By: Ulyses Jarred M.D.   On: 05/16/2022 02:38   CT Chest W Contrast  Result Date: 04/28/2022 CLINICAL DATA:  66 year old male with history of non-small cell lung cancer. Immunotherapy and chemotherapy ongoing. Radiation therapy complete. Follow-up study. * Tracking Code: BO * EXAM: CT CHEST, ABDOMEN, AND PELVIS WITH CONTRAST TECHNIQUE: Multidetector CT imaging of the chest, abdomen and pelvis was performed following the standard protocol during bolus administration of intravenous contrast. RADIATION DOSE REDUCTION: This exam was performed according to the departmental dose-optimization program which includes automated exposure control, adjustment of the mA and/or kV according to patient size and/or use of iterative reconstruction technique. CONTRAST:  155mL OMNIPAQUE IOHEXOL 300 MG/ML  SOLN COMPARISON:  CT of the chest 12/31/2021. CT of the abdomen and pelvis 12/05/2021. FINDINGS: CT CHEST FINDINGS Cardiovascular: Heart size is normal. There is no significant pericardial fluid, thickening or pericardial calcification. Aortic atherosclerosis. No definite coronary artery  calcifications. Right internal jugular single-lumen Port-A-Cath with tip terminating at the superior cavoatrial junction. Calcifications of the aortic valve. Mediastinum/Nodes: No pathologically enlarged mediastinal or hilar lymph nodes. Esophagus is unremarkable in appearance. No axillary lymphadenopathy. Lungs/Pleura: Mass-like area of architectural distortion in the posterolateral aspect of the right upper lobe (axial image 62 of series 4) corresponding to the treated neoplasm, similar to the prior examination, measuring 3.9 x 1.2 cm on today's study (previously 3.8 x 0.9 cm). Previously noted lesion in the medial aspect of the superior segment of the right lower lobe (axial image 66 of series 4) is slightly larger and more solid in appearance than  prior examinations, currently measuring 2.8 x 1.4 cm (previously 2.6 x 1.0 cm). Previously noted mixed solid and sub solid lesion in the right lower lobe (axial image 84 of series 4) currently measures 2.7 x 2.0 cm with a solid component medially which measures up to 11 mm in length, slightly larger than the prior examination (previously 2.5 x 1.7 cm). Predominantly thick-walled cystic lesion in the left lower lobe (axial image 112 of series 4) measures 3.8 x 3.4 cm with multiple internal solid components, largest of which measures 1.8 x 0.7 cm posteriorly (axial image 118 of series 4), grossly similar to the prior examination. Predominantly solid lesion in the posterior aspect of the right upper lobe near the apex (axial image 27 of series 4) currently measures 1.5 x 0.9 cm (previously 1.3 x 0.8 cm). Multiple other smaller lesions scattered throughout the lungs bilaterally appear generally similar in size and number to the prior examination. Small bilateral pleural effusions lying dependently, new compared to the prior examination. Diffuse bronchial wall thickening with mild centrilobular and paraseptal emphysema. Musculoskeletal: There are no aggressive appearing  lytic or blastic lesions noted in the visualized portions of the skeleton. Orthopedic fixation hardware in the lower cervical spine incompletely imaged. CT ABDOMEN PELVIS FINDINGS Hepatobiliary: Subcentimeter low-attenuation lesion in segment 2 of the liver (axial image 53 of series 2), too small to definitively characterize, but similar to the prior examination and favored to represent a tiny cyst or other benign lesion (no imaging follow-up recommended). No other new suspicious appearing hepatic lesions are noted. No intra or extrahepatic biliary ductal dilatation. Gallbladder is normal in appearance. Pancreas: No pancreatic mass. No pancreatic ductal dilatation. No pancreatic or peripancreatic fluid collections or inflammatory changes. Spleen: Unremarkable. Adrenals/Urinary Tract: Mild multifocal cortical thinning in both kidneys. No suspicious renal lesions. No hydroureteronephrosis. Urinary bladder is mildly diffusely thickened, similar to the prior examination, without discrete bladder wall mass. Bilateral adrenal glands are normal in appearance. Stomach/Bowel: The appearance of the stomach is normal. There is no pathologic dilatation of small bowel or colon. Normal appendix. Vascular/Lymphatic: Aortic atherosclerosis, without evidence of aneurysm or dissection in the abdominal or pelvic vasculature. Severe stenosis of the left external iliac artery, and complete occlusion of the left superficial femoral artery noted, similar to the prior examination. No lymphadenopathy noted in the abdomen or pelvis. Reproductive: Prostate gland and seminal vesicles are unremarkable in appearance. Other: No significant volume of ascites.  No pneumoperitoneum. Musculoskeletal: There are no aggressive appearing lytic or blastic lesions noted in the visualized portions of the skeleton. IMPRESSION: 1. Stable appearance of treated mass in the posterior aspect of the right upper lobe. Multiple other smaller lesions scattered  throughout the lungs bilaterally, similar in number. Some of these are stable in size, while several of the largest lesions do demonstrate slight progression compared to the prior examination, as detailed above, and are concerning for progressive multicentric bronchogenic adenocarcinoma. 2. No lymphadenopathy or definite signs of metastatic disease to the abdomen or pelvis. 3. Diffuse bronchial wall thickening with mild centrilobular and paraseptal emphysema; imaging findings suggestive of underlying COPD. 4. Aortic atherosclerosis. 5. There are calcifications of the aortic valve. Echocardiographic correlation for evaluation of potential valvular dysfunction may be warranted if clinically indicated. 6. Additional incidental findings, as above. Electronically Signed   By: Vinnie Langton M.D.   On: 04/28/2022 09:36   CT Abdomen Pelvis W Contrast  Result Date: 04/28/2022 CLINICAL DATA:  66 year old male with history of non-small cell lung cancer. Immunotherapy  and chemotherapy ongoing. Radiation therapy complete. Follow-up study. * Tracking Code: BO * EXAM: CT CHEST, ABDOMEN, AND PELVIS WITH CONTRAST TECHNIQUE: Multidetector CT imaging of the chest, abdomen and pelvis was performed following the standard protocol during bolus administration of intravenous contrast. RADIATION DOSE REDUCTION: This exam was performed according to the departmental dose-optimization program which includes automated exposure control, adjustment of the mA and/or kV according to patient size and/or use of iterative reconstruction technique. CONTRAST:  129mL OMNIPAQUE IOHEXOL 300 MG/ML  SOLN COMPARISON:  CT of the chest 12/31/2021. CT of the abdomen and pelvis 12/05/2021. FINDINGS: CT CHEST FINDINGS Cardiovascular: Heart size is normal. There is no significant pericardial fluid, thickening or pericardial calcification. Aortic atherosclerosis. No definite coronary artery calcifications. Right internal jugular single-lumen Port-A-Cath with  tip terminating at the superior cavoatrial junction. Calcifications of the aortic valve. Mediastinum/Nodes: No pathologically enlarged mediastinal or hilar lymph nodes. Esophagus is unremarkable in appearance. No axillary lymphadenopathy. Lungs/Pleura: Mass-like area of architectural distortion in the posterolateral aspect of the right upper lobe (axial image 62 of series 4) corresponding to the treated neoplasm, similar to the prior examination, measuring 3.9 x 1.2 cm on today's study (previously 3.8 x 0.9 cm). Previously noted lesion in the medial aspect of the superior segment of the right lower lobe (axial image 66 of series 4) is slightly larger and more solid in appearance than prior examinations, currently measuring 2.8 x 1.4 cm (previously 2.6 x 1.0 cm). Previously noted mixed solid and sub solid lesion in the right lower lobe (axial image 84 of series 4) currently measures 2.7 x 2.0 cm with a solid component medially which measures up to 11 mm in length, slightly larger than the prior examination (previously 2.5 x 1.7 cm). Predominantly thick-walled cystic lesion in the left lower lobe (axial image 112 of series 4) measures 3.8 x 3.4 cm with multiple internal solid components, largest of which measures 1.8 x 0.7 cm posteriorly (axial image 118 of series 4), grossly similar to the prior examination. Predominantly solid lesion in the posterior aspect of the right upper lobe near the apex (axial image 27 of series 4) currently measures 1.5 x 0.9 cm (previously 1.3 x 0.8 cm). Multiple other smaller lesions scattered throughout the lungs bilaterally appear generally similar in size and number to the prior examination. Small bilateral pleural effusions lying dependently, new compared to the prior examination. Diffuse bronchial wall thickening with mild centrilobular and paraseptal emphysema. Musculoskeletal: There are no aggressive appearing lytic or blastic lesions noted in the visualized portions of the  skeleton. Orthopedic fixation hardware in the lower cervical spine incompletely imaged. CT ABDOMEN PELVIS FINDINGS Hepatobiliary: Subcentimeter low-attenuation lesion in segment 2 of the liver (axial image 53 of series 2), too small to definitively characterize, but similar to the prior examination and favored to represent a tiny cyst or other benign lesion (no imaging follow-up recommended). No other new suspicious appearing hepatic lesions are noted. No intra or extrahepatic biliary ductal dilatation. Gallbladder is normal in appearance. Pancreas: No pancreatic mass. No pancreatic ductal dilatation. No pancreatic or peripancreatic fluid collections or inflammatory changes. Spleen: Unremarkable. Adrenals/Urinary Tract: Mild multifocal cortical thinning in both kidneys. No suspicious renal lesions. No hydroureteronephrosis. Urinary bladder is mildly diffusely thickened, similar to the prior examination, without discrete bladder wall mass. Bilateral adrenal glands are normal in appearance. Stomach/Bowel: The appearance of the stomach is normal. There is no pathologic dilatation of small bowel or colon. Normal appendix. Vascular/Lymphatic: Aortic atherosclerosis, without evidence of aneurysm or  dissection in the abdominal or pelvic vasculature. Severe stenosis of the left external iliac artery, and complete occlusion of the left superficial femoral artery noted, similar to the prior examination. No lymphadenopathy noted in the abdomen or pelvis. Reproductive: Prostate gland and seminal vesicles are unremarkable in appearance. Other: No significant volume of ascites.  No pneumoperitoneum. Musculoskeletal: There are no aggressive appearing lytic or blastic lesions noted in the visualized portions of the skeleton. IMPRESSION: 1. Stable appearance of treated mass in the posterior aspect of the right upper lobe. Multiple other smaller lesions scattered throughout the lungs bilaterally, similar in number. Some of these are  stable in size, while several of the largest lesions do demonstrate slight progression compared to the prior examination, as detailed above, and are concerning for progressive multicentric bronchogenic adenocarcinoma. 2. No lymphadenopathy or definite signs of metastatic disease to the abdomen or pelvis. 3. Diffuse bronchial wall thickening with mild centrilobular and paraseptal emphysema; imaging findings suggestive of underlying COPD. 4. Aortic atherosclerosis. 5. There are calcifications of the aortic valve. Echocardiographic correlation for evaluation of potential valvular dysfunction may be warranted if clinically indicated. 6. Additional incidental findings, as above. Electronically Signed   By: Vinnie Langton M.D.   On: 04/28/2022 09:36        IMPRESSION/PLAN: 1. Stage IV non-small cell lung cancer, adenocarcinoma involving multifocal disease in the lung and brain metastases. We discussed the findings from his MRI and discussion in brain oncology conference. He has some changes again consistent with early radiation necrosis but is asymptomatic. He knows to call if he becomes symptomatic and given his immunotherapy consideration of vitamin e and trental would be given. He is in agreement with this plan and we will plan to repeat his MRI in 3 months or sooner if needed. He will continue with Dr. Julien Nordmann as well.  2. Right parotid pleomorphic adenoma. He did have biopsy in 2023, and the findings were benign but have the risk of malignant transformation. He will be followed in ENT by Dr. Fredric Dine on an annual basis now for this.      This encounter was conducted via telephone.  The patient has provided two factor identification and has given verbal consent for this type of encounter and has been advised to only accept a meeting of this type in a secure network environment. The time spent during this encounter was 30 minutes including preparation, discussion, and coordination of the patient's  care. The attendants for this meeting include  Hayden Pedro  and [name].  During the encounter,  Hayden Pedro were located at [facility] Radiation Oncology Department.  Mivaan SHARONE ALMOND was located at home.       Carola Rhine, PAC

## 2022-05-19 NOTE — Progress Notes (Signed)
Telephone nursing appointment to receive recent Brain MRI results from 05/16/2022 per Shona Simpson PA-C. I verified patient's identity and began nursing interview. A voicemail was left.   Patient aware of their 3:30pm-05/19/2022 telephone appointment w/ Shona Simpson PA-C. I left my extension [phone] in case patient needs anything. Patient verbalized understanding. This concludes the nursing interview.   Patient contact spouse Errik Mitchelle -[phone]     Leandra Kern, LPN

## 2022-05-20 ENCOUNTER — Inpatient Hospital Stay (HOSPITAL_BASED_OUTPATIENT_CLINIC_OR_DEPARTMENT_OTHER): Payer: Medicare Other | Admitting: Internal Medicine

## 2022-05-20 ENCOUNTER — Inpatient Hospital Stay: Payer: Medicare Other

## 2022-05-20 ENCOUNTER — Encounter: Payer: Self-pay | Admitting: Internal Medicine

## 2022-05-20 ENCOUNTER — Other Ambulatory Visit: Payer: Self-pay

## 2022-05-20 VITALS — BP 107/63 | HR 67 | Temp 97.8°F | Resp 18

## 2022-05-20 DIAGNOSIS — C7931 Secondary malignant neoplasm of brain: Secondary | ICD-10-CM | POA: Diagnosis not present

## 2022-05-20 DIAGNOSIS — Z5111 Encounter for antineoplastic chemotherapy: Secondary | ICD-10-CM | POA: Diagnosis not present

## 2022-05-20 DIAGNOSIS — C3491 Malignant neoplasm of unspecified part of right bronchus or lung: Secondary | ICD-10-CM

## 2022-05-20 DIAGNOSIS — Z5112 Encounter for antineoplastic immunotherapy: Secondary | ICD-10-CM | POA: Diagnosis not present

## 2022-05-20 DIAGNOSIS — C3431 Malignant neoplasm of lower lobe, right bronchus or lung: Secondary | ICD-10-CM | POA: Diagnosis not present

## 2022-05-20 DIAGNOSIS — Z79899 Other long term (current) drug therapy: Secondary | ICD-10-CM | POA: Diagnosis not present

## 2022-05-20 DIAGNOSIS — Z87891 Personal history of nicotine dependence: Secondary | ICD-10-CM | POA: Diagnosis not present

## 2022-05-20 DIAGNOSIS — C3411 Malignant neoplasm of upper lobe, right bronchus or lung: Secondary | ICD-10-CM | POA: Diagnosis not present

## 2022-05-20 DIAGNOSIS — Z923 Personal history of irradiation: Secondary | ICD-10-CM | POA: Diagnosis not present

## 2022-05-20 LAB — CBC WITH DIFFERENTIAL (CANCER CENTER ONLY)
Abs Immature Granulocytes: 0.01 10*3/uL (ref 0.00–0.07)
Basophils Absolute: 0 10*3/uL (ref 0.0–0.1)
Basophils Relative: 0 %
Eosinophils Absolute: 0.2 10*3/uL (ref 0.0–0.5)
Eosinophils Relative: 3 %
HCT: 31.8 % — ABNORMAL LOW (ref 39.0–52.0)
Hemoglobin: 10.9 g/dL — ABNORMAL LOW (ref 13.0–17.0)
Immature Granulocytes: 0 %
Lymphocytes Relative: 44 %
Lymphs Abs: 1.9 10*3/uL (ref 0.7–4.0)
MCH: 29.6 pg (ref 26.0–34.0)
MCHC: 34.3 g/dL (ref 30.0–36.0)
MCV: 86.4 fL (ref 80.0–100.0)
Monocytes Absolute: 0.7 10*3/uL (ref 0.1–1.0)
Monocytes Relative: 15 %
Neutro Abs: 1.7 10*3/uL (ref 1.7–7.7)
Neutrophils Relative %: 38 %
Platelet Count: 176 10*3/uL (ref 150–400)
RBC: 3.68 MIL/uL — ABNORMAL LOW (ref 4.22–5.81)
RDW: 17.2 % — ABNORMAL HIGH (ref 11.5–15.5)
WBC Count: 4.5 10*3/uL (ref 4.0–10.5)
nRBC: 0 % (ref 0.0–0.2)

## 2022-05-20 LAB — CMP (CANCER CENTER ONLY)
ALT: 16 U/L (ref 0–44)
AST: 25 U/L (ref 15–41)
Albumin: 3.3 g/dL — ABNORMAL LOW (ref 3.5–5.0)
Alkaline Phosphatase: 50 U/L (ref 38–126)
Anion gap: 4 — ABNORMAL LOW (ref 5–15)
BUN: 14 mg/dL (ref 8–23)
CO2: 27 mmol/L (ref 22–32)
Calcium: 9 mg/dL (ref 8.9–10.3)
Chloride: 108 mmol/L (ref 98–111)
Creatinine: 1.29 mg/dL — ABNORMAL HIGH (ref 0.61–1.24)
GFR, Estimated: >60 mL/min (ref >60)
Glucose, Bld: 88 mg/dL (ref 70–99)
Potassium: 4 mmol/L (ref 3.5–5.1)
Sodium: 139 mmol/L (ref 135–145)
Total Bilirubin: 0.3 mg/dL (ref 0.3–1.2)
Total Protein: 6.7 g/dL (ref 6.5–8.1)

## 2022-05-20 MED ORDER — SODIUM CHLORIDE 0.9 % IV SOLN: Freq: Once | INTRAVENOUS | Status: AC

## 2022-05-20 MED ORDER — SODIUM CHLORIDE 0.9% FLUSH
10.0000 mL | INTRAVENOUS | Status: DC | PRN
  Administered 2022-05-20: 10 mL

## 2022-05-20 MED ORDER — SODIUM CHLORIDE 0.9 % IV SOLN
200.0000 mg | Freq: Once | INTRAVENOUS | Status: AC
  Administered 2022-05-20: 200 mg via INTRAVENOUS
  Filled 2022-05-20: qty 200

## 2022-05-20 MED ORDER — PROCHLORPERAZINE MALEATE 10 MG PO TABS
10.0000 mg | ORAL_TABLET | Freq: Once | ORAL | Status: AC
  Administered 2022-05-20: 10 mg via ORAL
  Filled 2022-05-20: qty 1

## 2022-05-20 MED ORDER — HEPARIN SOD (PORK) LOCK FLUSH 100 UNIT/ML IV SOLN
500.0000 [IU] | Freq: Once | INTRAVENOUS | Status: AC | PRN
  Administered 2022-05-20: 500 [IU]

## 2022-05-20 MED ORDER — SODIUM CHLORIDE 0.9 % IV SOLN
500.0000 mg/m2 | Freq: Once | INTRAVENOUS | Status: AC
  Administered 2022-05-20: 1000 mg via INTRAVENOUS
  Filled 2022-05-20: qty 40

## 2022-05-20 NOTE — Progress Notes (Signed)
[facility] [phone]   [phone]  OFFICE PROGRESS NOTE  [name], MD [place] [address]  DIAGNOSIS: Stage IV (T3, N2, M1c) non-small cell lung cancer favoring adenocarcinoma presented with right upper lobe lung mass in addition to right lower lobe, left lower lobe in addition to right hilar and mediastinal lymphadenopathy in addition to metastatic brain lesions diagnosed in October 2021.  Molecular studies by [employer]:  [id number], 9.5%,   PRIOR THERAPY: SRS to 3 brain lesion under the care of Dr. Lisbeth Renshaw.  CURRENT THERAPY: Systemic chemotherapy with carboplatin for AUC of 5, Alimta 500 mg/M2 and Keytruda 200 mg IV every 3 weeks.  First dose April 24, 2020.  Status post 36 cycles.  Starting from cycle #5 the patient will be on maintenance treatment with Alimta and Keytruda every 3 weeks.  INTERVAL HISTORY: Nicolas Allen 66 y.o. male returns to the clinic today for follow-up visit.  The patient is feeling fine today with no concerning complaints.  He denied having any chest pain, shortness of breath, cough or hemoptysis.  He denied having any fever or chills.  He has no nausea, vomiting, diarrhea or constipation.  He has no headache or visual changes.  He has no recent weight loss or night sweats.  He is here today for evaluation before starting cycle #37.   MEDICAL HISTORY: Past Medical History:  Diagnosis Date   Anemia 06/24/2012   ANXIETY 02/03/2007   Cervical disc disease 02/12/2012   S/p surgury jan 2013   Erectile dysfunction 02/11/2011   GERD (gastroesophageal reflux disease)    GLUCOSE INTOLERANCE 01/04/2008   HYPERLIPIDEMIA 02/03/2007   HYPERTENSION 02/03/2007   IBS (irritable bowel syndrome)    Impaired glucose tolerance 02/11/2011   nscl ca 03/30/2020   Smoker 08/22/2014   Substance abuse ([place]) 2001   sober for 16 yrs    ALLERGIES:  has No Known Allergies.  MEDICATIONS:  Current  Outpatient Medications  Medication Sig Dispense Refill   amLODipine-benazepril (LOTREL) 10-20 MG capsule Take 1 capsule by mouth once daily 90 capsule 3   aspirin 325 MG EC tablet Take 1 tablet (325 mg total) by mouth daily. 90 tablet 99   b complex vitamins tablet Take 1 tablet by mouth daily.     cholecalciferol (VITAMIN D3) 25 MCG (1000 UNIT) tablet Take 1,000 Units by mouth daily.     folic acid (FOLVITE) 1 MG tablet Take 1 tablet by mouth once daily 90 tablet 3   Garlic 4166 MG CAPS Take 1,000 mg by mouth daily.      lidocaine-prilocaine (EMLA) cream Apply to the Port-A-Cath site 30-60 minutes before chemotherapy. 30 g 0   Omega-3 Fatty Acids (FISH OIL PO) Take 1 capsule by mouth daily.      polyethylene glycol (MIRALAX / GLYCOLAX) 17 g packet Take 17 g by mouth daily as needed.     prochlorperazine (COMPAZINE) 10 MG tablet Take 1 tablet (10 mg total) by mouth every 6 (six) hours as needed for nausea or vomiting. 30 tablet 0   rosuvastatin (CRESTOR) 40 MG tablet Take 1 tablet (40 mg total) by mouth daily. 90 tablet 3   vardenafil (LEVITRA) 20 MG tablet TAKE 1 TABLET BY MOUTH AS NEEDED FOR  ERECTILE  DYSFUNCTION 10 tablet 5   No current facility-administered medications for this visit.    SURGICAL HISTORY:  Past Surgical History:  Procedure Laterality Date   BRONCHIAL  BIOPSY  03/30/2020   Procedure: BRONCHIAL BIOPSIES;  Surgeon: Garner Nash, DO;  Location: [facility];  Service: Pulmonary;;   BRONCHIAL BRUSHINGS  03/30/2020   Procedure: BRONCHIAL BRUSHINGS;  Surgeon: Garner Nash, DO;  Location: [facility];  Service: Pulmonary;;   BRONCHIAL NEEDLE ASPIRATION BIOPSY  03/30/2020   Procedure: BRONCHIAL NEEDLE ASPIRATION BIOPSIES;  Surgeon: Garner Nash, DO;  Location: [facility];  Service: Pulmonary;;   BRONCHIAL WASHINGS  03/30/2020   Procedure: BRONCHIAL WASHINGS;  Surgeon: Garner Nash, DO;  Location: [facility];  Service: Pulmonary;;   COLONOSCOPY  2007   IR  IMAGING GUIDED PORT INSERTION  04/23/2020   neck fusion  2012   C4   NO PAST SURGERIES     VIDEO BRONCHOSCOPY WITH ENDOBRONCHIAL NAVIGATION N/A 03/30/2020   Procedure: VIDEO BRONCHOSCOPY WITH ENDOBRONCHIAL NAVIGATION;  Surgeon: Garner Nash, DO;  Location: [facility];  Service: Pulmonary;  Laterality: N/A;   VIDEO BRONCHOSCOPY WITH ENDOBRONCHIAL ULTRASOUND N/A 03/30/2020   Procedure: VIDEO BRONCHOSCOPY WITH ENDOBRONCHIAL ULTRASOUND;  Surgeon: Garner Nash, DO;  Location: [facility];  Service: Pulmonary;  Laterality: N/A;    REVIEW OF SYSTEMS:  A comprehensive review of systems was negative.   PHYSICAL EXAMINATION: General appearance: alert, cooperative, and no distress Head: Normocephalic, without obvious abnormality, atraumatic Neck: no adenopathy, no JVD, supple, symmetrical, trachea midline, and thyroid not enlarged, symmetric, no tenderness/mass/nodules Lymph nodes: Cervical, supraclavicular, and axillary nodes normal. Resp: clear to auscultation bilaterally Back: symmetric, no curvature. ROM normal. No CVA tenderness. Cardio: regular rate and rhythm, S1, S2 normal, no murmur, click, rub or gallop GI: soft, non-tender; bowel sounds normal; no masses,  no organomegaly Extremities: extremities normal, atraumatic, no cyanosis or edema  ECOG PERFORMANCE STATUS: 1 - Symptomatic but completely ambulatory  Blood pressure 108/63, pulse 77, temperature 98.2 F (36.8 C), temperature source Oral, resp. rate 16, height 6' (1.829 m), weight 176 lb 6.4 oz (80 kg), SpO2 100 %.  LABORATORY DATA: Lab Results  Component Value Date   WBC 4.5 05/20/2022   HGB 10.9 (L) 05/20/2022   HCT 31.8 (L) 05/20/2022   MCV 86.4 05/20/2022   PLT 176 05/20/2022      Chemistry      Component Value Date/Time   NA 139 05/20/2022 1010   K 4.0 05/20/2022 1010   CL 108 05/20/2022 1010   CO2 27 05/20/2022 1010   BUN 14 05/20/2022 1010   CREATININE 1.29 (H) 05/20/2022 1010   CREATININE 0.88  02/21/2020 1115      Component Value Date/Time   CALCIUM 9.0 05/20/2022 1010   ALKPHOS 50 05/20/2022 1010   AST 25 05/20/2022 1010   ALT 16 05/20/2022 1010   BILITOT 0.3 05/20/2022 1010       RADIOGRAPHIC STUDIES: MR Brain W Wo Contrast  Result Date: 05/16/2022 CLINICAL DATA:  SRS restaging.  Lung carcinoma metastatic to brain EXAM: MRI HEAD WITHOUT AND WITH CONTRAST TECHNIQUE: Multiplanar, multiecho pulse sequences of the brain and surrounding structures were obtained without and with intravenous contrast. CONTRAST:  7 mL gadopiclenol (Vueway) 0.5 mmol/ml solution COMPARISON:  01/29/2022 FINDINGS: Brain: No acute infarct, mass effect or extra-axial collection. No acute or chronic hemorrhage. There is multifocal hyperintense T2-weighted signal within the white matter. Generalized volume loss. Continued slight increase in size of midline cerebellar metastasis, now 12 mm, previously 10 mm (series 13, image 57). No new metastases. Vascular: Major flow voids are preserved. Skull and upper cervical spine: Normal calvarium and skull base.  Visualized upper cervical spine and soft tissues are normal. Sinuses/Orbits:No paranasal sinus fluid levels or advanced mucosal thickening. No mastoid or middle ear effusion. Normal orbits. Unchanged right parotid nodule. IMPRESSION: 1. Continued slight increase in size of midline cerebellar metastasis, now 12 mm, previously 10 mm. 2. No new metastases. Electronically Signed   By: Ulyses Jarred M.D.   On: 05/16/2022 02:38   CT Chest W Contrast  Result Date: 04/28/2022 CLINICAL DATA:  66 year old male with history of non-small cell lung cancer. Immunotherapy and chemotherapy ongoing. Radiation therapy complete. Follow-up study. * Tracking Code: BO * EXAM: CT CHEST, ABDOMEN, AND PELVIS WITH CONTRAST TECHNIQUE: Multidetector CT imaging of the chest, abdomen and pelvis was performed following the standard protocol during bolus administration of intravenous contrast.  RADIATION DOSE REDUCTION: This exam was performed according to the departmental dose-optimization program which includes automated exposure control, adjustment of the mA and/or kV according to patient size and/or use of iterative reconstruction technique. CONTRAST:  160mL OMNIPAQUE IOHEXOL 300 MG/ML  SOLN COMPARISON:  CT of the chest 12/31/2021. CT of the abdomen and pelvis 12/05/2021. FINDINGS: CT CHEST FINDINGS Cardiovascular: Heart size is normal. There is no significant pericardial fluid, thickening or pericardial calcification. Aortic atherosclerosis. No definite coronary artery calcifications. Right internal jugular single-lumen Port-A-Cath with tip terminating at the superior cavoatrial junction. Calcifications of the aortic valve. Mediastinum/Nodes: No pathologically enlarged mediastinal or hilar lymph nodes. Esophagus is unremarkable in appearance. No axillary lymphadenopathy. Lungs/Pleura: Mass-like area of architectural distortion in the posterolateral aspect of the right upper lobe (axial image 62 of series 4) corresponding to the treated neoplasm, similar to the prior examination, measuring 3.9 x 1.2 cm on today's study (previously 3.8 x 0.9 cm). Previously noted lesion in the medial aspect of the superior segment of the right lower lobe (axial image 66 of series 4) is slightly larger and more solid in appearance than prior examinations, currently measuring 2.8 x 1.4 cm (previously 2.6 x 1.0 cm). Previously noted mixed solid and sub solid lesion in the right lower lobe (axial image 84 of series 4) currently measures 2.7 x 2.0 cm with a solid component medially which measures up to 11 mm in length, slightly larger than the prior examination (previously 2.5 x 1.7 cm). Predominantly thick-walled cystic lesion in the left lower lobe (axial image 112 of series 4) measures 3.8 x 3.4 cm with multiple internal solid components, largest of which measures 1.8 x 0.7 cm posteriorly (axial image 118 of series 4),  grossly similar to the prior examination. Predominantly solid lesion in the posterior aspect of the right upper lobe near the apex (axial image 27 of series 4) currently measures 1.5 x 0.9 cm (previously 1.3 x 0.8 cm). Multiple other smaller lesions scattered throughout the lungs bilaterally appear generally similar in size and number to the prior examination. Small bilateral pleural effusions lying dependently, new compared to the prior examination. Diffuse bronchial wall thickening with mild centrilobular and paraseptal emphysema. Musculoskeletal: There are no aggressive appearing lytic or blastic lesions noted in the visualized portions of the skeleton. Orthopedic fixation hardware in the lower cervical spine incompletely imaged. CT ABDOMEN PELVIS FINDINGS Hepatobiliary: Subcentimeter low-attenuation lesion in segment 2 of the liver (axial image 53 of series 2), too small to definitively characterize, but similar to the prior examination and favored to represent a tiny cyst or other benign lesion (no imaging follow-up recommended). No other new suspicious appearing hepatic lesions are noted. No intra or extrahepatic biliary ductal dilatation. Gallbladder is  normal in appearance. Pancreas: No pancreatic mass. No pancreatic ductal dilatation. No pancreatic or peripancreatic fluid collections or inflammatory changes. Spleen: Unremarkable. Adrenals/Urinary Tract: Mild multifocal cortical thinning in both kidneys. No suspicious renal lesions. No hydroureteronephrosis. Urinary bladder is mildly diffusely thickened, similar to the prior examination, without discrete bladder wall mass. Bilateral adrenal glands are normal in appearance. Stomach/Bowel: The appearance of the stomach is normal. There is no pathologic dilatation of small bowel or colon. Normal appendix. Vascular/Lymphatic: Aortic atherosclerosis, without evidence of aneurysm or dissection in the abdominal or pelvic vasculature. Severe stenosis of the left  external iliac artery, and complete occlusion of the left superficial femoral artery noted, similar to the prior examination. No lymphadenopathy noted in the abdomen or pelvis. Reproductive: Prostate gland and seminal vesicles are unremarkable in appearance. Other: No significant volume of ascites.  No pneumoperitoneum. Musculoskeletal: There are no aggressive appearing lytic or blastic lesions noted in the visualized portions of the skeleton. IMPRESSION: 1. Stable appearance of treated mass in the posterior aspect of the right upper lobe. Multiple other smaller lesions scattered throughout the lungs bilaterally, similar in number. Some of these are stable in size, while several of the largest lesions do demonstrate slight progression compared to the prior examination, as detailed above, and are concerning for progressive multicentric bronchogenic adenocarcinoma. 2. No lymphadenopathy or definite signs of metastatic disease to the abdomen or pelvis. 3. Diffuse bronchial wall thickening with mild centrilobular and paraseptal emphysema; imaging findings suggestive of underlying COPD. 4. Aortic atherosclerosis. 5. There are calcifications of the aortic valve. Echocardiographic correlation for evaluation of potential valvular dysfunction may be warranted if clinically indicated. 6. Additional incidental findings, as above. Electronically Signed   By: Vinnie Langton M.D.   On: 04/28/2022 09:36   CT Abdomen Pelvis W Contrast  Result Date: 04/28/2022 CLINICAL DATA:  66 year old male with history of non-small cell lung cancer. Immunotherapy and chemotherapy ongoing. Radiation therapy complete. Follow-up study. * Tracking Code: BO * EXAM: CT CHEST, ABDOMEN, AND PELVIS WITH CONTRAST TECHNIQUE: Multidetector CT imaging of the chest, abdomen and pelvis was performed following the standard protocol during bolus administration of intravenous contrast. RADIATION DOSE REDUCTION: This exam was performed according to the  departmental dose-optimization program which includes automated exposure control, adjustment of the mA and/or kV according to patient size and/or use of iterative reconstruction technique. CONTRAST:  158mL OMNIPAQUE IOHEXOL 300 MG/ML  SOLN COMPARISON:  CT of the chest 12/31/2021. CT of the abdomen and pelvis 12/05/2021. FINDINGS: CT CHEST FINDINGS Cardiovascular: Heart size is normal. There is no significant pericardial fluid, thickening or pericardial calcification. Aortic atherosclerosis. No definite coronary artery calcifications. Right internal jugular single-lumen Port-A-Cath with tip terminating at the superior cavoatrial junction. Calcifications of the aortic valve. Mediastinum/Nodes: No pathologically enlarged mediastinal or hilar lymph nodes. Esophagus is unremarkable in appearance. No axillary lymphadenopathy. Lungs/Pleura: Mass-like area of architectural distortion in the posterolateral aspect of the right upper lobe (axial image 62 of series 4) corresponding to the treated neoplasm, similar to the prior examination, measuring 3.9 x 1.2 cm on today's study (previously 3.8 x 0.9 cm). Previously noted lesion in the medial aspect of the superior segment of the right lower lobe (axial image 66 of series 4) is slightly larger and more solid in appearance than prior examinations, currently measuring 2.8 x 1.4 cm (previously 2.6 x 1.0 cm). Previously noted mixed solid and sub solid lesion in the right lower lobe (axial image 84 of series 4) currently measures 2.7 x 2.0 cm  with a solid component medially which measures up to 11 mm in length, slightly larger than the prior examination (previously 2.5 x 1.7 cm). Predominantly thick-walled cystic lesion in the left lower lobe (axial image 112 of series 4) measures 3.8 x 3.4 cm with multiple internal solid components, largest of which measures 1.8 x 0.7 cm posteriorly (axial image 118 of series 4), grossly similar to the prior examination. Predominantly solid lesion  in the posterior aspect of the right upper lobe near the apex (axial image 27 of series 4) currently measures 1.5 x 0.9 cm (previously 1.3 x 0.8 cm). Multiple other smaller lesions scattered throughout the lungs bilaterally appear generally similar in size and number to the prior examination. Small bilateral pleural effusions lying dependently, new compared to the prior examination. Diffuse bronchial wall thickening with mild centrilobular and paraseptal emphysema. Musculoskeletal: There are no aggressive appearing lytic or blastic lesions noted in the visualized portions of the skeleton. Orthopedic fixation hardware in the lower cervical spine incompletely imaged. CT ABDOMEN PELVIS FINDINGS Hepatobiliary: Subcentimeter low-attenuation lesion in segment 2 of the liver (axial image 53 of series 2), too small to definitively characterize, but similar to the prior examination and favored to represent a tiny cyst or other benign lesion (no imaging follow-up recommended). No other new suspicious appearing hepatic lesions are noted. No intra or extrahepatic biliary ductal dilatation. Gallbladder is normal in appearance. Pancreas: No pancreatic mass. No pancreatic ductal dilatation. No pancreatic or peripancreatic fluid collections or inflammatory changes. Spleen: Unremarkable. Adrenals/Urinary Tract: Mild multifocal cortical thinning in both kidneys. No suspicious renal lesions. No hydroureteronephrosis. Urinary bladder is mildly diffusely thickened, similar to the prior examination, without discrete bladder wall mass. Bilateral adrenal glands are normal in appearance. Stomach/Bowel: The appearance of the stomach is normal. There is no pathologic dilatation of small bowel or colon. Normal appendix. Vascular/Lymphatic: Aortic atherosclerosis, without evidence of aneurysm or dissection in the abdominal or pelvic vasculature. Severe stenosis of the left external iliac artery, and complete occlusion of the left superficial  femoral artery noted, similar to the prior examination. No lymphadenopathy noted in the abdomen or pelvis. Reproductive: Prostate gland and seminal vesicles are unremarkable in appearance. Other: No significant volume of ascites.  No pneumoperitoneum. Musculoskeletal: There are no aggressive appearing lytic or blastic lesions noted in the visualized portions of the skeleton. IMPRESSION: 1. Stable appearance of treated mass in the posterior aspect of the right upper lobe. Multiple other smaller lesions scattered throughout the lungs bilaterally, similar in number. Some of these are stable in size, while several of the largest lesions do demonstrate slight progression compared to the prior examination, as detailed above, and are concerning for progressive multicentric bronchogenic adenocarcinoma. 2. No lymphadenopathy or definite signs of metastatic disease to the abdomen or pelvis. 3. Diffuse bronchial wall thickening with mild centrilobular and paraseptal emphysema; imaging findings suggestive of underlying COPD. 4. Aortic atherosclerosis. 5. There are calcifications of the aortic valve. Echocardiographic correlation for evaluation of potential valvular dysfunction may be warranted if clinically indicated. 6. Additional incidental findings, as above. Electronically Signed   By: Vinnie Langton M.D.   On: 04/28/2022 09:36     ASSESSMENT AND PLAN: This is a very pleasant 66 years old African-American male recently diagnosed with stage IV (T3, N2, M1c)  non-small cell lung cancer, adenocarcinoma presented with right upper lobe lung mass in addition to right lower lobe, left lower lobe in addition to right hilar and mediastinal lymphadenopathy in addition to metastatic brain lesions diagnosed  in October 2021. The patient has molecular studies by [employer] that showed no actionable mutations. He underwent SRS treatment to his brain lesion. The patient is currently undergoing systemic chemotherapy with  carboplatin for AUC of 5, Alimta 500 mg/M2 and Keytruda 200 mg IV every 3 weeks status post 36 cycles.  Starting from cycle #5 the patient is on maintenance treatment with Alimta and Keytruda every 3 weeks. The patient has been tolerating this treatment well with no concerning adverse effects. I recommended for him to proceed with cycle #37 today as planned. I will see the patient back for follow-up visit in 3 weeks for evaluation before starting the next cycle of his treatment. He was advised to call immediately if he has any other concerning symptoms in the interval. The patient voices understanding of current disease status and treatment options and is in agreement with the current care plan.  All questions were answered. The patient knows to call the clinic with any problems, questions or concerns. We can certainly see the patient much sooner if necessary.  Disclaimer: This note was dictated with voice recognition software. Similar sounding words can inadvertently be transcribed and may not be corrected upon review.

## 2022-05-20 NOTE — Patient Instructions (Signed)
[facility]  Discharge Instructions: Thank you for choosing [facility] to provide your oncology and hematology care.   If you have a lab appointment with the [facility], please go directly to the [facility] and check in at the registration area.   Wear comfortable clothing and clothing appropriate for easy access to any Portacath or PICC line.   We strive to give you quality time with your provider. You may need to reschedule your appointment if you arrive late (15 or more minutes).  Arriving late affects you and other patients whose appointments are after yours.  Also, if you miss three or more appointments without notifying the office, you may be dismissed from the clinic at the provider's discretion.      For prescription refill requests, have your pharmacy contact our office and allow 72 hours for refills to be completed.    Today you received the following chemotherapy and/or immunotherapy agents: pembrolizumab and pemetrexed      To help prevent nausea and vomiting after your treatment, we encourage you to take your nausea medication as directed.  BELOW ARE SYMPTOMS THAT SHOULD BE REPORTED IMMEDIATELY: *FEVER GREATER THAN 100.4 F (38 C) OR HIGHER *CHILLS OR SWEATING *NAUSEA AND VOMITING THAT IS NOT CONTROLLED WITH YOUR NAUSEA MEDICATION *UNUSUAL SHORTNESS OF BREATH *UNUSUAL BRUISING OR BLEEDING *URINARY PROBLEMS (pain or burning when urinating, or frequent urination) *BOWEL PROBLEMS (unusual diarrhea, constipation, pain near the anus) TENDERNESS IN MOUTH AND THROAT WITH OR WITHOUT PRESENCE OF ULCERS (sore throat, sores in mouth, or a toothache) UNUSUAL RASH, SWELLING OR PAIN  UNUSUAL VAGINAL DISCHARGE OR ITCHING   Items with * indicate a potential emergency and should be followed up as soon as possible or go to the Emergency Department if any problems should occur.  Please show the CHEMOTHERAPY ALERT CARD or IMMUNOTHERAPY ALERT  CARD at check-in to the Emergency Department and triage nurse.  Should you have questions after your visit or need to cancel or reschedule your appointment, please contact [facility]  Dept: [phone]  and follow the prompts.  Office hours are 8:00 a.m. to 4:30 p.m. Monday - Friday. Please note that voicemails left after 4:00 p.m. may not be returned until the following business day.  We are closed weekends and major holidays. You have access to a nurse at all times for urgent questions. Please call the main number to the clinic Dept: [phone] and follow the prompts.   For any non-urgent questions, you may also contact your provider using MyChart. We now offer e-Visits for anyone 61 and older to request care online for non-urgent symptoms. For details visit [email or web address].   Also download the MyChart app! Go to the app store, search "MyChart", open the app, select [facility], and log in with your MyChart username and password.  Masks are optional in the cancer centers. If you would like for your care team to wear a mask while they are taking care of you, please let them know. You may have one support person who is at least 65 years old accompany you for your appointments.

## 2022-06-06 NOTE — Progress Notes (Unsigned)
[facility] OFFICE PROGRESS NOTE  [name], MD [place] [address]  DIAGNOSIS: Stage IV (T3, N2, M1c) non-small cell lung cancer favoring adenocarcinoma presented with right upper lobe lung mass in addition to right lower lobe, left lower lobe in addition to right hilar and mediastinal lymphadenopathy in addition to metastatic brain lesions diagnosed in October 2021.   Molecular studies by [employer]:   [id number], 9.5%,  PRIOR THERAPY: 1) SRS to 3 brain lesion under the care of Dr. Lisbeth Renshaw.  Completed on April 20, 2020 2) SRS to the recurrent brain lesion under the care of Dr. Lisbeth Renshaw. Completed on 02/12/22  CURRENT THERAPY: 1) Systemic chemotherapy with carboplatin for AUC of 5, Alimta 500 mg/M2 and Keytruda 200 mg IV every 3 weeks.  First dose April 24, 2020.  Status post 37 cycles.  Starting from cycle #5 the patient will be on maintenance treatment with Alimta and Keytruda every 3 weeks. 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 This was resumed starting from cycle #24.    INTERVAL HISTORY: Nicolas Allen 66 y.o. male returns to the clinic today for a follow-up visit. The patient is feeling fairly well today without any concerning complaints except he has some nasal congestion and thinks he may have a mild cold that started on Saturday. Denies any fevers, sore throat, malaise, cough, or shortness of breath. Denies sick contacts. He has tried robitussin and his wife gave him zyrtec. He got his flu and covid vaccine's this season.    He is currently undergoing treatment with alimta and [place]. He tolerated his last cycle well without any concerning adverse side effects. He denies any chills, night sweats, or unexplained weight loss.  Denies any nausea, diarrhea, or vomiting. He takes stool softener for constipation. Denies any chest pain, shortness of breath, or hemoptysis.  Denies any significant  cough.  Denies any headache or visual changes. He completed SRS re-treatment to the brain lesion under the care of Dr. Lisbeth Renshaw on 02/12/22. He tolerated that well.   He is here for evaluation and repeat blood work before starting cycle #38.     MEDICAL HISTORY: Past Medical History:  Diagnosis Date   Anemia 06/24/2012   ANXIETY 02/03/2007   Cervical disc disease 02/12/2012   S/p surgury jan 2013   Erectile dysfunction 02/11/2011   GERD (gastroesophageal reflux disease)    GLUCOSE INTOLERANCE 01/04/2008   HYPERLIPIDEMIA 02/03/2007   HYPERTENSION 02/03/2007   IBS (irritable bowel syndrome)    Impaired glucose tolerance 02/11/2011   nscl ca 03/30/2020   Smoker 08/22/2014   Substance abuse ([facility]) 2001   sober for 16 yrs    ALLERGIES:  has No Known Allergies.  MEDICATIONS:  Current Outpatient Medications  Medication Sig Dispense Refill   amLODipine-benazepril (LOTREL) 10-20 MG capsule Take 1 capsule by mouth once daily 90 capsule 3   aspirin 325 MG EC tablet Take 1 tablet (325 mg total) by mouth daily. 90 tablet 99   b complex vitamins tablet Take 1 tablet by mouth daily.     cholecalciferol (VITAMIN D3) 25 MCG (1000 UNIT) tablet Take 1,000 Units by mouth daily.     folic acid (FOLVITE) 1 MG tablet Take 1 tablet by mouth once daily 90 tablet 3   Garlic 6301 MG CAPS Take 1,000 mg by mouth daily.      lidocaine-prilocaine (EMLA) cream Apply to the Port-A-Cath site 30-60 minutes before  chemotherapy. 30 g 0   Omega-3 Fatty Acids (FISH OIL PO) Take 1 capsule by mouth daily.      polyethylene glycol (MIRALAX / GLYCOLAX) 17 g packet Take 17 g by mouth daily as needed.     prochlorperazine (COMPAZINE) 10 MG tablet Take 1 tablet (10 mg total) by mouth every 6 (six) hours as needed for nausea or vomiting. 30 tablet 0   rosuvastatin (CRESTOR) 40 MG tablet Take 1 tablet (40 mg total) by mouth daily. 90 tablet 3   vardenafil (LEVITRA) 20 MG tablet TAKE 1 TABLET BY MOUTH AS NEEDED FOR  ERECTILE   DYSFUNCTION 10 tablet 5   No current facility-administered medications for this visit.   Facility-Administered Medications Ordered in Other Visits  Medication Dose Route Frequency Provider Last Rate Last Admin   heparin lock flush 100 unit/mL  500 Units Intracatheter Once PRN Curt Bears, MD       pembrolizumab [facility]) 200 mg in sodium chloride 0.9 % 50 mL chemo infusion  200 mg Intravenous Once Curt Bears, MD       PEMEtrexed (ALIMTA) 1,000 mg in sodium chloride 0.9 % 100 mL chemo infusion  500 mg/m2 (Treatment Plan Recorded) Intravenous Once Curt Bears, MD       sodium chloride flush (NS) 0.9 % injection 10 mL  10 mL Intracatheter PRN Curt Bears, MD        SURGICAL HISTORY:  Past Surgical History:  Procedure Laterality Date   BRONCHIAL BIOPSY  03/30/2020   Procedure: BRONCHIAL BIOPSIES;  Surgeon: Garner Nash, DO;  Location: [facility];  Service: Pulmonary;;   BRONCHIAL BRUSHINGS  03/30/2020   Procedure: BRONCHIAL BRUSHINGS;  Surgeon: Garner Nash, DO;  Location: [facility];  Service: Pulmonary;;   BRONCHIAL NEEDLE ASPIRATION BIOPSY  03/30/2020   Procedure: BRONCHIAL NEEDLE ASPIRATION BIOPSIES;  Surgeon: Garner Nash, DO;  Location: [facility];  Service: Pulmonary;;   BRONCHIAL WASHINGS  03/30/2020   Procedure: BRONCHIAL WASHINGS;  Surgeon: Garner Nash, DO;  Location: [facility];  Service: Pulmonary;;   COLONOSCOPY  2007   IR IMAGING GUIDED PORT INSERTION  04/23/2020   neck fusion  2012   C4   NO PAST SURGERIES     VIDEO BRONCHOSCOPY WITH ENDOBRONCHIAL NAVIGATION N/A 03/30/2020   Procedure: VIDEO BRONCHOSCOPY WITH ENDOBRONCHIAL NAVIGATION;  Surgeon: Garner Nash, DO;  Location: [facility];  Service: Pulmonary;  Laterality: N/A;   VIDEO BRONCHOSCOPY WITH ENDOBRONCHIAL ULTRASOUND N/A 03/30/2020   Procedure: VIDEO BRONCHOSCOPY WITH ENDOBRONCHIAL ULTRASOUND;  Surgeon: Garner Nash, DO;  Location: [facility];  Service:  Pulmonary;  Laterality: N/A;    REVIEW OF SYSTEMS:   Constitutional: Negative for appetite change, chills, fatigue, fever and unexpected weight change.  HENT: Positive for mild nasal congestion. Negative for mouth sores, nosebleeds, sore throat and trouble swallowing.   Eyes: Negative for eye problems and icterus.  Respiratory: Negative for cough, hemoptysis, shortness of breath and wheezing.   Cardiovascular: Negative for chest pain and leg swelling.  Gastrointestinal: Negative for abdominal pain, constipation, diarrhea, nausea and vomiting.  Genitourinary: Negative for bladder incontinence, difficulty urinating, dysuria, frequency and hematuria.   Musculoskeletal: Negative for back pain, gait problem, neck pain and neck stiffness.  Skin: Negative for itching and rash.  Neurological: Negative for dizziness, extremity weakness, gait problem, headaches, light-headedness and seizures.  Hematological: Negative for adenopathy. Does not bruise/bleed easily.  Psychiatric/Behavioral: Negative for confusion, depression and sleep disturbance. The patient is not nervous/anxious.    PHYSICAL EXAMINATION:  Blood pressure 124/72, pulse 86, temperature 98.3 F (36.8 C), temperature source Oral, resp. rate 18, height 6' (1.829 m), weight 176 lb 1.6 oz (79.9 kg), SpO2 99 %.  ECOG PERFORMANCE STATUS: 1  Physical Exam  Constitutional: Oriented to person, place, and time and well-developed, well-nourished, and in no distress. HENT:  Head: Normocephalic and atraumatic.  Mouth/Throat: Oropharynx is clear and moist. No oropharyngeal exudate.  Eyes: Conjunctivae are normal. Right eye exhibits no discharge. Left eye exhibits no discharge. No scleral icterus.  Neck: Normal range of motion. Neck supple.  Cardiovascular: Normal rate, regular rhythm, normal heart sounds and intact distal pulses.   Pulmonary/Chest: Effort normal and breath sounds normal. No respiratory distress. No wheezes. No rales.  Abdominal:  Soft. Bowel sounds are normal. Exhibits no distension and no mass. There is no tenderness.  Musculoskeletal: Normal range of motion. Exhibits no edema.   Lymphadenopathy:    No cervical adenopathy.  Neurological: Alert and oriented to person, place, and time. Exhibits normal muscle tone. Gait normal. Coordination normal.  Skin: Skin is warm and dry. No rash noted. Not diaphoretic. No erythema. No pallor.  Psychiatric: Mood, memory and judgment normal.  Vitals reviewed.  LABORATORY DATA: Lab Results  Component Value Date   WBC 8.6 06/10/2022   HGB 11.2 (L) 06/10/2022   HCT 31.6 (L) 06/10/2022   MCV 85.2 06/10/2022   PLT 182 06/10/2022      Chemistry      Component Value Date/Time   NA 136 06/10/2022 1035   K 3.8 06/10/2022 1035   CL 106 06/10/2022 1035   CO2 25 06/10/2022 1035   BUN 12 06/10/2022 1035   CREATININE 1.37 (H) 06/10/2022 1035   CREATININE 0.88 02/21/2020 1115      Component Value Date/Time   CALCIUM 9.0 06/10/2022 1035   ALKPHOS 52 06/10/2022 1035   AST 19 06/10/2022 1035   ALT 12 06/10/2022 1035   BILITOT 0.3 06/10/2022 1035       RADIOGRAPHIC STUDIES:  MR Brain W Wo Contrast  Result Date: 05/16/2022 CLINICAL DATA:  SRS restaging.  Lung carcinoma metastatic to brain EXAM: MRI HEAD WITHOUT AND WITH CONTRAST TECHNIQUE: Multiplanar, multiecho pulse sequences of the brain and surrounding structures were obtained without and with intravenous contrast. CONTRAST:  7 mL gadopiclenol (Vueway) 0.5 mmol/ml solution COMPARISON:  01/29/2022 FINDINGS: Brain: No acute infarct, mass effect or extra-axial collection. No acute or chronic hemorrhage. There is multifocal hyperintense T2-weighted signal within the white matter. Generalized volume loss. Continued slight increase in size of midline cerebellar metastasis, now 12 mm, previously 10 mm (series 13, image 57). No new metastases. Vascular: Major flow voids are preserved. Skull and upper cervical spine: Normal calvarium and  skull base. Visualized upper cervical spine and soft tissues are normal. Sinuses/Orbits:No paranasal sinus fluid levels or advanced mucosal thickening. No mastoid or middle ear effusion. Normal orbits. Unchanged right parotid nodule. IMPRESSION: 1. Continued slight increase in size of midline cerebellar metastasis, now 12 mm, previously 10 mm. 2. No new metastases. Electronically Signed   By: Ulyses Jarred M.D.   On: 05/16/2022 02:38     ASSESSMENT/PLAN:  This is a very pleasant 66 year old African-American male diagnosed with stage IV (T3, N2, M1C) non-small cell lung cancer, adenocarcinoma.  He presented with a right upper lobe lung mass in addition to a right lower lobe, left lower lobe lung lesions with right hilar and mediastinal lymphadenopathy.  He also had metastatic disease to the brain.  He was diagnosed  in October 2022.  His molecular studies by [employer] did not show any actionable mutations.    The patient underwent SRS treatment to the metastatic brain lesions under the care of Dr. Lisbeth Renshaw.  This was completed on 04/20/2021.    He completed re-treatment SRS to the progressive brain lesion in the cerebellum under the care of Dr. Lisbeth Renshaw on 02/12/22.   The patient is currently undergoing systemic chemotherapy/immunotherapy.  He started with carboplatin for an AUC of 5, Alimta 500 mg per metered squared, Keytruda 200 mg IV every 3 weeks.  He is status post 37 cycles.  Starting from cycle #5, the patient has been on maintenance treatment with Alimta and Keytruda IV every 3 weeks. Starting from cycle #22, the patient has been on single agent alimta. [name] has been on hold due to CT findings of suspicious immunotherapy mediated enteritis. [name] was resumed with cycle #24 and he has been tolerating it well   Labs were reviewed.  Recommend that he proceed with cycle #38 today as schedule.   We will see him back for follow-up visit and repeat blood work in 3 weeks before starting cycle  #39  The patient has mild nasal congestion today.  No other systemic symptoms such as fever, sore throat, malaise, cough, or shortness of breath.  No leukocytosis.  His vitals are within normal limits.  He is well-appearing today.  Advised him to use Mucinex, antihistamine, and saline nasal sprays.  I did caution him if he develops any worsening symptoms such as shortness of breath, cough, sore throat, or fevers that he needs to be reevaluated/reassessed.   The patient was advised to call immediately if he has any concerning symptoms in the interval. The patient voices understanding of current disease status and treatment options and is in agreement with the current care plan. All questions were answered. The patient knows to call the clinic with any problems, questions or concerns. We can certainly see the patient much sooner if necessary      No orders of the defined types were placed in this encounter.    The total time spent in the appointment was 20-29 minutes.   Nicolas Allen L Lyndsay Talamante, PA-C 06/10/22

## 2022-06-10 ENCOUNTER — Inpatient Hospital Stay (HOSPITAL_BASED_OUTPATIENT_CLINIC_OR_DEPARTMENT_OTHER): Payer: Medicare Other | Admitting: Physician Assistant

## 2022-06-10 ENCOUNTER — Inpatient Hospital Stay: Payer: Medicare Other

## 2022-06-10 ENCOUNTER — Other Ambulatory Visit: Payer: Self-pay

## 2022-06-10 VITALS — BP 124/72 | HR 86 | Temp 98.3°F | Resp 18 | Ht 72.0 in | Wt 176.1 lb

## 2022-06-10 DIAGNOSIS — Z95828 Presence of other vascular implants and grafts: Secondary | ICD-10-CM

## 2022-06-10 DIAGNOSIS — C3491 Malignant neoplasm of unspecified part of right bronchus or lung: Secondary | ICD-10-CM

## 2022-06-10 DIAGNOSIS — Z5112 Encounter for antineoplastic immunotherapy: Secondary | ICD-10-CM

## 2022-06-10 DIAGNOSIS — Z5111 Encounter for antineoplastic chemotherapy: Secondary | ICD-10-CM | POA: Diagnosis not present

## 2022-06-10 DIAGNOSIS — Z923 Personal history of irradiation: Secondary | ICD-10-CM | POA: Diagnosis not present

## 2022-06-10 DIAGNOSIS — Z79899 Other long term (current) drug therapy: Secondary | ICD-10-CM | POA: Diagnosis not present

## 2022-06-10 DIAGNOSIS — Z87891 Personal history of nicotine dependence: Secondary | ICD-10-CM | POA: Diagnosis not present

## 2022-06-10 DIAGNOSIS — C3431 Malignant neoplasm of lower lobe, right bronchus or lung: Secondary | ICD-10-CM | POA: Diagnosis not present

## 2022-06-10 DIAGNOSIS — C7931 Secondary malignant neoplasm of brain: Secondary | ICD-10-CM | POA: Diagnosis not present

## 2022-06-10 DIAGNOSIS — C3411 Malignant neoplasm of upper lobe, right bronchus or lung: Secondary | ICD-10-CM | POA: Diagnosis not present

## 2022-06-10 LAB — CMP (CANCER CENTER ONLY)
ALT: 12 U/L (ref 0–44)
AST: 19 U/L (ref 15–41)
Albumin: 3.3 g/dL — ABNORMAL LOW (ref 3.5–5.0)
Alkaline Phosphatase: 52 U/L (ref 38–126)
Anion gap: 5 (ref 5–15)
BUN: 12 mg/dL (ref 8–23)
CO2: 25 mmol/L (ref 22–32)
Calcium: 9 mg/dL (ref 8.9–10.3)
Chloride: 106 mmol/L (ref 98–111)
Creatinine: 1.37 mg/dL — ABNORMAL HIGH (ref 0.61–1.24)
GFR, Estimated: 57 mL/min — ABNORMAL LOW (ref >60)
Glucose, Bld: 123 mg/dL — ABNORMAL HIGH (ref 70–99)
Potassium: 3.8 mmol/L (ref 3.5–5.1)
Sodium: 136 mmol/L (ref 135–145)
Total Bilirubin: 0.3 mg/dL (ref 0.3–1.2)
Total Protein: 7 g/dL (ref 6.5–8.1)

## 2022-06-10 LAB — CBC WITH DIFFERENTIAL (CANCER CENTER ONLY)
Abs Immature Granulocytes: 0.02 10*3/uL (ref 0.00–0.07)
Basophils Absolute: 0 10*3/uL (ref 0.0–0.1)
Basophils Relative: 0 %
Eosinophils Absolute: 0.2 10*3/uL (ref 0.0–0.5)
Eosinophils Relative: 3 %
HCT: 31.6 % — ABNORMAL LOW (ref 39.0–52.0)
Hemoglobin: 11.2 g/dL — ABNORMAL LOW (ref 13.0–17.0)
Immature Granulocytes: 0 %
Lymphocytes Relative: 20 %
Lymphs Abs: 1.7 10*3/uL (ref 0.7–4.0)
MCH: 30.2 pg (ref 26.0–34.0)
MCHC: 35.4 g/dL (ref 30.0–36.0)
MCV: 85.2 fL (ref 80.0–100.0)
Monocytes Absolute: 0.9 10*3/uL (ref 0.1–1.0)
Monocytes Relative: 11 %
Neutro Abs: 5.6 10*3/uL (ref 1.7–7.7)
Neutrophils Relative %: 66 %
Platelet Count: 182 10*3/uL (ref 150–400)
RBC: 3.71 MIL/uL — ABNORMAL LOW (ref 4.22–5.81)
RDW: 16 % — ABNORMAL HIGH (ref 11.5–15.5)
WBC Count: 8.6 10*3/uL (ref 4.0–10.5)
nRBC: 0 % (ref 0.0–0.2)

## 2022-06-10 MED ORDER — HEPARIN SOD (PORK) LOCK FLUSH 100 UNIT/ML IV SOLN
500.0000 [IU] | Freq: Once | INTRAVENOUS | Status: AC | PRN
  Administered 2022-06-10: 500 [IU]

## 2022-06-10 MED ORDER — SODIUM CHLORIDE 0.9% FLUSH
10.0000 mL | Freq: Once | INTRAVENOUS | Status: AC
  Administered 2022-06-10: 10 mL

## 2022-06-10 MED ORDER — PROCHLORPERAZINE MALEATE 10 MG PO TABS
10.0000 mg | ORAL_TABLET | Freq: Once | ORAL | Status: AC
  Administered 2022-06-10: 10 mg via ORAL
  Filled 2022-06-10: qty 1

## 2022-06-10 MED ORDER — SODIUM CHLORIDE 0.9% FLUSH
10.0000 mL | INTRAVENOUS | Status: DC | PRN
  Administered 2022-06-10: 10 mL

## 2022-06-10 MED ORDER — SODIUM CHLORIDE 0.9 % IV SOLN
200.0000 mg | Freq: Once | INTRAVENOUS | Status: AC
  Administered 2022-06-10: 200 mg via INTRAVENOUS
  Filled 2022-06-10: qty 8

## 2022-06-10 MED ORDER — SODIUM CHLORIDE 0.9 % IV SOLN: Freq: Once | INTRAVENOUS | Status: AC

## 2022-06-10 MED ORDER — SODIUM CHLORIDE 0.9 % IV SOLN
500.0000 mg/m2 | Freq: Once | INTRAVENOUS | Status: AC
  Administered 2022-06-10: 1000 mg via INTRAVENOUS
  Filled 2022-06-10: qty 40

## 2022-06-10 NOTE — Patient Instructions (Signed)
[facility]COL[facility]  Discharge Instructions: Thank you for choosing [facility] to provide your oncology and hematology care.   If you have a lab appointment with the [facility], please go directly to the [facility] and check in at the registration area.   Wear comfortable clothing and clothing appropriate for easy access to any Portacath or PICC line.   We strive to give you quality time with your provider. You may need to reschedule your appointment if you arrive late (15 or more minutes).  Arriving late affects you and other patients whose appointments are after yours.  Also, if you miss three or more appointments without notifying the office, you may be dismissed from the clinic at the provider's discretion.      For prescription refill requests, have your pharmacy contact our office and allow 72 hours for refills to be completed.    Today you received the following chemotherapy and/or immunotherapy agents keytruda alimta      To help prevent nausea and vomiting after your treatment, we encourage you to take your nausea medication as directed.  BELOW ARE SYMPTOMS THAT SHOULD BE REPORTED IMMEDIATELY: *FEVER GREATER THAN 100.4 F (38 C) OR HIGHER *CHILLS OR SWEATING *NAUSEA AND VOMITING THAT IS NOT CONTROLLED WITH YOUR NAUSEA MEDICATION *UNUSUAL SHORTNESS OF BREATH *UNUSUAL BRUISING OR BLEEDING *URINARY PROBLEMS (pain or burning when urinating, or frequent urination) *BOWEL PROBLEMS (unusual diarrhea, constipation, pain near the anus) TENDERNESS IN MOUTH AND THROAT WITH OR WITHOUT PRESENCE OF ULCERS (sore throat, sores in mouth, or a toothache) UNUSUAL RASH, SWELLING OR PAIN  UNUSUAL VAGINAL DISCHARGE OR ITCHING   Items with * indicate a potential emergency and should be followed up as soon as possible or go to the Emergency Department if any problems should occur.  Please show the CHEMOTHERAPY ALERT CARD or IMMUNOTHERAPY ALERT CARD at  check-in to the Emergency Department and triage nurse.  Should you have questions after your visit or need to cancel or reschedule your appointment, please contact [facility]  Dept: [phone]  and follow the prompts.  Office hours are 8:00 a.m. to 4:30 p.m. Monday - Friday. Please note that voicemails left after 4:00 p.m. may not be returned until the following business day.  We are closed weekends and major holidays. You have access to a nurse at all times for urgent questions. Please call the main number to the clinic Dept: [phone] and follow the prompts.   For any non-urgent questions, you may also contact your provider using MyChart. We now offer e-Visits for anyone 19 and older to request care online for non-urgent symptoms. For details visit [email or web address].   Also download the MyChart app! Go to the app store, search "MyChart", open the app, select [facility], and log in with your MyChart username and password.  Masks are optional in the cancer centers. If you would like for your care team to wear a mask while they are taking care of you, please let them know. You may have one support person who is at least 65 years old accompany you for your appointments.

## 2022-06-13 ENCOUNTER — Telehealth: Payer: Self-pay | Admitting: Internal Medicine

## 2022-06-13 NOTE — Telephone Encounter (Signed)
Called patient regarding all upcoming appointments, patient has been called and notified. 

## 2022-06-24 ENCOUNTER — Encounter: Payer: Self-pay | Admitting: Internal Medicine

## 2022-07-01 ENCOUNTER — Other Ambulatory Visit: Payer: Self-pay

## 2022-07-01 ENCOUNTER — Inpatient Hospital Stay: Payer: Medicare Other

## 2022-07-01 ENCOUNTER — Inpatient Hospital Stay (HOSPITAL_BASED_OUTPATIENT_CLINIC_OR_DEPARTMENT_OTHER): Payer: Medicare Other | Admitting: Internal Medicine

## 2022-07-01 ENCOUNTER — Inpatient Hospital Stay: Payer: Medicare Other | Attending: Internal Medicine

## 2022-07-01 VITALS — BP 103/61 | HR 92 | Temp 98.2°F | Resp 16 | Wt 176.2 lb

## 2022-07-01 VITALS — BP 95/58 | HR 66 | Resp 17

## 2022-07-01 DIAGNOSIS — C3490 Malignant neoplasm of unspecified part of unspecified bronchus or lung: Secondary | ICD-10-CM

## 2022-07-01 DIAGNOSIS — C3431 Malignant neoplasm of lower lobe, right bronchus or lung: Secondary | ICD-10-CM | POA: Insufficient documentation

## 2022-07-01 DIAGNOSIS — Z79899 Other long term (current) drug therapy: Secondary | ICD-10-CM | POA: Diagnosis not present

## 2022-07-01 DIAGNOSIS — C3491 Malignant neoplasm of unspecified part of right bronchus or lung: Secondary | ICD-10-CM

## 2022-07-01 DIAGNOSIS — C7931 Secondary malignant neoplasm of brain: Secondary | ICD-10-CM | POA: Insufficient documentation

## 2022-07-01 DIAGNOSIS — C3411 Malignant neoplasm of upper lobe, right bronchus or lung: Secondary | ICD-10-CM | POA: Diagnosis not present

## 2022-07-01 DIAGNOSIS — Z5111 Encounter for antineoplastic chemotherapy: Secondary | ICD-10-CM | POA: Diagnosis not present

## 2022-07-01 DIAGNOSIS — Z5112 Encounter for antineoplastic immunotherapy: Secondary | ICD-10-CM | POA: Insufficient documentation

## 2022-07-01 LAB — CBC WITH DIFFERENTIAL (CANCER CENTER ONLY)
Abs Immature Granulocytes: 0.01 10*3/uL (ref 0.00–0.07)
Basophils Absolute: 0 10*3/uL (ref 0.0–0.1)
Basophils Relative: 1 %
Eosinophils Absolute: 0.1 10*3/uL (ref 0.0–0.5)
Eosinophils Relative: 3 %
HCT: 30.7 % — ABNORMAL LOW (ref 39.0–52.0)
Hemoglobin: 10.5 g/dL — ABNORMAL LOW (ref 13.0–17.0)
Immature Granulocytes: 0 %
Lymphocytes Relative: 38 %
Lymphs Abs: 1.6 10*3/uL (ref 0.7–4.0)
MCH: 29.3 pg (ref 26.0–34.0)
MCHC: 34.2 g/dL (ref 30.0–36.0)
MCV: 85.8 fL (ref 80.0–100.0)
Monocytes Absolute: 0.5 10*3/uL (ref 0.1–1.0)
Monocytes Relative: 12 %
Neutro Abs: 1.9 10*3/uL (ref 1.7–7.7)
Neutrophils Relative %: 46 %
Platelet Count: 275 10*3/uL (ref 150–400)
RBC: 3.58 MIL/uL — ABNORMAL LOW (ref 4.22–5.81)
RDW: 15.4 % (ref 11.5–15.5)
WBC Count: 4.2 10*3/uL (ref 4.0–10.5)
nRBC: 0 % (ref 0.0–0.2)

## 2022-07-01 LAB — CMP (CANCER CENTER ONLY)
ALT: 15 U/L (ref 0–44)
AST: 22 U/L (ref 15–41)
Albumin: 2.9 g/dL — ABNORMAL LOW (ref 3.5–5.0)
Alkaline Phosphatase: 50 U/L (ref 38–126)
Anion gap: 7 (ref 5–15)
BUN: 11 mg/dL (ref 8–23)
CO2: 26 mmol/L (ref 22–32)
Calcium: 8.6 mg/dL — ABNORMAL LOW (ref 8.9–10.3)
Chloride: 107 mmol/L (ref 98–111)
Creatinine: 1.21 mg/dL (ref 0.61–1.24)
GFR, Estimated: >60 mL/min (ref >60)
Glucose, Bld: 130 mg/dL — ABNORMAL HIGH (ref 70–99)
Potassium: 3.4 mmol/L — ABNORMAL LOW (ref 3.5–5.1)
Sodium: 140 mmol/L (ref 135–145)
Total Bilirubin: 0.2 mg/dL — ABNORMAL LOW (ref 0.3–1.2)
Total Protein: 6.5 g/dL (ref 6.5–8.1)

## 2022-07-01 MED ORDER — SODIUM CHLORIDE 0.9 % IV SOLN: Freq: Once | INTRAVENOUS | Status: AC

## 2022-07-01 MED ORDER — SODIUM CHLORIDE 0.9 % IV SOLN
500.0000 mg/m2 | Freq: Once | INTRAVENOUS | Status: AC
  Administered 2022-07-01: 1000 mg via INTRAVENOUS
  Filled 2022-07-01: qty 40

## 2022-07-01 MED ORDER — PROCHLORPERAZINE MALEATE 10 MG PO TABS
10.0000 mg | ORAL_TABLET | Freq: Once | ORAL | Status: AC
  Administered 2022-07-01: 10 mg via ORAL
  Filled 2022-07-01: qty 1

## 2022-07-01 MED ORDER — SODIUM CHLORIDE 0.9 % IV SOLN
200.0000 mg | Freq: Once | INTRAVENOUS | Status: AC
  Administered 2022-07-01: 200 mg via INTRAVENOUS
  Filled 2022-07-01: qty 200

## 2022-07-01 MED ORDER — CYANOCOBALAMIN 1000 MCG/ML IJ SOLN
1000.0000 ug | Freq: Once | INTRAMUSCULAR | Status: AC
  Administered 2022-07-01: 1000 ug via INTRAMUSCULAR
  Filled 2022-07-01: qty 1

## 2022-07-01 NOTE — Progress Notes (Signed)
[facility] [phone]   [phone]  OFFICE PROGRESS NOTE  Corwin Levins, MD [address] [place] [address]  DIAGNOSIS: Stage IV (T3, N2, M1c) non-small cell lung cancer favoring adenocarcinoma presented with right upper lobe lung mass in addition to right lower lobe, left lower lobe in addition to right hilar and mediastinal lymphadenopathy in addition to metastatic brain lesions diagnosed in October 2021.  Molecular studies by [employer]:  [id number], 9.5%,   PRIOR THERAPY: SRS to 3 brain lesion under the care of Dr. Mitzi Hansen.  CURRENT THERAPY: Systemic chemotherapy with carboplatin for AUC of 5, Alimta 500 mg/M2 and Keytruda 200 mg IV every 3 weeks.  First dose April 24, 2020.  Status post 38 cycles.  Starting from cycle #5 the patient will be on maintenance treatment with Alimta and Keytruda every 3 weeks.  INTERVAL HISTORY: Nicolas Allen 67 y.o. male returns to the clinic today for follow-up visit.  The patient is feeling fine today with no concerning complaints.  He had few episodes of diarrhea have been in the last 2 weeks no more than 3 times a day.  He denied having any current chest pain, shortness of breath, cough or hemoptysis.  He has no current nausea, vomiting, diarrhea or constipation.  He has no headache or visual changes.  He has no fever or chills.  He is here today for evaluation before starting cycle #39 of his treatment.    MEDICAL HISTORY: Past Medical History:  Diagnosis Date   Anemia 06/24/2012   ANXIETY 02/03/2007   Cervical disc disease 02/12/2012   S/p surgury jan 2013   Erectile dysfunction 02/11/2011   GERD (gastroesophageal reflux disease)    GLUCOSE INTOLERANCE 01/04/2008   HYPERLIPIDEMIA 02/03/2007   HYPERTENSION 02/03/2007   IBS (irritable bowel syndrome)    Impaired glucose tolerance 02/11/2011   nscl ca 03/30/2020   Smoker 08/22/2014   Substance abuse ([facility]) 2001   sober for 16 yrs     ALLERGIES:  has No Known Allergies.  MEDICATIONS:  Current Outpatient Medications  Medication Sig Dispense Refill   amLODipine-benazepril (LOTREL) 10-20 MG capsule Take 1 capsule by mouth once daily 90 capsule 3   aspirin 325 MG EC tablet Take 1 tablet (325 mg total) by mouth daily. 90 tablet 99   b complex vitamins tablet Take 1 tablet by mouth daily.     cholecalciferol (VITAMIN D3) 25 MCG (1000 UNIT) tablet Take 1,000 Units by mouth daily.     folic acid (FOLVITE) 1 MG tablet Take 1 tablet by mouth once daily 90 tablet 3   Garlic 1000 MG CAPS Take 1,000 mg by mouth daily.      lidocaine-prilocaine (EMLA) cream Apply to the Port-A-Cath site 30-60 minutes before chemotherapy. 30 g 0   Omega-3 Fatty Acids (FISH OIL PO) Take 1 capsule by mouth daily.      polyethylene glycol (MIRALAX / GLYCOLAX) 17 g packet Take 17 g by mouth daily as needed.     prochlorperazine (COMPAZINE) 10 MG tablet Take 1 tablet (10 mg total) by mouth every 6 (six) hours as needed for nausea or vomiting. 30 tablet 0   rosuvastatin (CRESTOR) 40 MG tablet Take 1 tablet (40 mg total) by mouth daily. 90 tablet 3   vardenafil (LEVITRA) 20 MG tablet TAKE 1 TABLET BY MOUTH AS NEEDED FOR  ERECTILE  DYSFUNCTION 10 tablet 5   No current facility-administered medications for this visit.  SURGICAL HISTORY:  Past Surgical History:  Procedure Laterality Date   BRONCHIAL BIOPSY  03/30/2020   Procedure: BRONCHIAL BIOPSIES;  Surgeon: Josephine Igo, DO;  Location: [facility];  Service: Pulmonary;;   BRONCHIAL BRUSHINGS  03/30/2020   Procedure: BRONCHIAL BRUSHINGS;  Surgeon: Josephine Igo, DO;  Location: [facility];  Service: Pulmonary;;   BRONCHIAL NEEDLE ASPIRATION BIOPSY  03/30/2020   Procedure: BRONCHIAL NEEDLE ASPIRATION BIOPSIES;  Surgeon: Josephine Igo, DO;  Location: [facility];  Service: Pulmonary;;   BRONCHIAL WASHINGS  03/30/2020   Procedure: BRONCHIAL WASHINGS;  Surgeon: Josephine Igo, DO;   Location: [facility];  Service: Pulmonary;;   COLONOSCOPY  2007   IR IMAGING GUIDED PORT INSERTION  04/23/2020   neck fusion  2012   C4   NO PAST SURGERIES     VIDEO BRONCHOSCOPY WITH ENDOBRONCHIAL NAVIGATION N/A 03/30/2020   Procedure: VIDEO BRONCHOSCOPY WITH ENDOBRONCHIAL NAVIGATION;  Surgeon: Josephine Igo, DO;  Location: [facility];  Service: Pulmonary;  Laterality: N/A;   VIDEO BRONCHOSCOPY WITH ENDOBRONCHIAL ULTRASOUND N/A 03/30/2020   Procedure: VIDEO BRONCHOSCOPY WITH ENDOBRONCHIAL ULTRASOUND;  Surgeon: Josephine Igo, DO;  Location: [facility];  Service: Pulmonary;  Laterality: N/A;    REVIEW OF SYSTEMS:  A comprehensive review of systems was negative except for: Gastrointestinal: positive for diarrhea   PHYSICAL EXAMINATION: General appearance: alert, cooperative, and no distress Head: Normocephalic, without obvious abnormality, atraumatic Neck: no adenopathy, no JVD, supple, symmetrical, trachea midline, and thyroid not enlarged, symmetric, no tenderness/mass/nodules Lymph nodes: Cervical, supraclavicular, and axillary nodes normal. Resp: clear to auscultation bilaterally Back: symmetric, no curvature. ROM normal. No CVA tenderness. Cardio: regular rate and rhythm, S1, S2 normal, no murmur, click, rub or gallop GI: soft, non-tender; bowel sounds normal; no masses,  no organomegaly Extremities: extremities normal, atraumatic, no cyanosis or edema  ECOG PERFORMANCE STATUS: 1 - Symptomatic but completely ambulatory  Blood pressure 103/61, pulse 92, temperature 98.2 F (36.8 C), temperature source Oral, resp. rate 16, weight 176 lb 3.2 oz (79.9 kg), SpO2 98 %.  LABORATORY DATA: Lab Results  Component Value Date   WBC 8.6 06/10/2022   HGB 11.2 (L) 06/10/2022   HCT 31.6 (L) 06/10/2022   MCV 85.2 06/10/2022   PLT 182 06/10/2022      Chemistry      Component Value Date/Time   NA 136 06/10/2022 1035   K 3.8 06/10/2022 1035   CL 106 06/10/2022 1035   CO2 25  06/10/2022 1035   BUN 12 06/10/2022 1035   CREATININE 1.37 (H) 06/10/2022 1035   CREATININE 0.88 02/21/2020 1115      Component Value Date/Time   CALCIUM 9.0 06/10/2022 1035   ALKPHOS 52 06/10/2022 1035   AST 19 06/10/2022 1035   ALT 12 06/10/2022 1035   BILITOT 0.3 06/10/2022 1035       RADIOGRAPHIC STUDIES: No results found.   ASSESSMENT AND PLAN: This is a very pleasant 67 years old African-American male recently diagnosed with stage IV (T3, N2, M1c)  non-small cell lung cancer, adenocarcinoma presented with right upper lobe lung mass in addition to right lower lobe, left lower lobe in addition to right hilar and mediastinal lymphadenopathy in addition to metastatic brain lesions diagnosed in October 2021. The patient has molecular studies by [employer] that showed no actionable mutations. He underwent SRS treatment to his brain lesion. The patient is currently undergoing systemic chemotherapy with carboplatin for AUC of 5, Alimta 500 mg/M2 and Keytruda 200 mg IV  every 3 weeks status post 38 cycles.  Starting from cycle #5 the patient is on maintenance treatment with Alimta and Keytruda every 3 weeks. The patient continues to tolerate this treatment well with no concerning adverse effects. I recommended for him to proceed with cycle #39 today as planned. I will see him back for follow-up visit in 3 weeks for evaluation with repeat CT scan of the chest, abdomen and pelvis for restaging of his disease. The patient was advised to call immediately if he has any other concerning symptoms in the interval. The patient voices understanding of current disease status and treatment options and is in agreement with the current care plan.  All questions were answered. The patient knows to call the clinic with any problems, questions or concerns. We can certainly see the patient much sooner if necessary.  Disclaimer: This note was dictated with voice recognition software. Similar sounding words  can inadvertently be transcribed and may not be corrected upon review.

## 2022-07-01 NOTE — Patient Instructions (Signed)
[facility]  Discharge Instructions: Thank you for choosing [facility] to provide your oncology and hematology care.   If you have a lab appointment with the Cancer Center, please go directly to the Cancer Center and check in at the registration area.   Wear comfortable clothing and clothing appropriate for easy access to any Portacath or PICC line.   We strive to give you quality time with your provider. You may need to reschedule your appointment if you arrive late (15 or more minutes).  Arriving late affects you and other patients whose appointments are after yours.  Also, if you miss three or more appointments without notifying the office, you may be dismissed from the clinic at the provider's discretion.      For prescription refill requests, have your pharmacy contact our office and allow 72 hours for refills to be completed.    Today you received the following chemotherapy and/or immunotherapy agents: Pembrolizumab (Keytruda) and Pemetrexed (Alimta).   To help prevent nausea and vomiting after your treatment, we encourage you to take your nausea medication as directed.  BELOW ARE SYMPTOMS THAT SHOULD BE REPORTED IMMEDIATELY: *FEVER GREATER THAN 100.4 F (38 C) OR HIGHER *CHILLS OR SWEATING *NAUSEA AND VOMITING THAT IS NOT CONTROLLED WITH YOUR NAUSEA MEDICATION *UNUSUAL SHORTNESS OF BREATH *UNUSUAL BRUISING OR BLEEDING *URINARY PROBLEMS (pain or burning when urinating, or frequent urination) *BOWEL PROBLEMS (unusual diarrhea, constipation, pain near the anus) TENDERNESS IN MOUTH AND THROAT WITH OR WITHOUT PRESENCE OF ULCERS (sore throat, sores in mouth, or a toothache) UNUSUAL RASH, SWELLING OR PAIN  UNUSUAL VAGINAL DISCHARGE OR ITCHING   Items with * indicate a potential emergency and should be followed up as soon as possible or go to the Emergency Department if any problems should occur.  Please show the CHEMOTHERAPY ALERT CARD or  IMMUNOTHERAPY ALERT CARD at check-in to the Emergency Department and triage nurse.  Should you have questions after your visit or need to cancel or reschedule your appointment, please contact [facility]  Dept: [phone]  and follow the prompts.  Office hours are 8:00 a.m. to 4:30 p.m. Monday - Friday. Please note that voicemails left after 4:00 p.m. may not be returned until the following business day.  We are closed weekends and major holidays. You have access to a nurse at all times for urgent questions. Please call the main number to the clinic Dept: [phone] and follow the prompts.   For any non-urgent questions, you may also contact your provider using MyChart. We now offer e-Visits for anyone 52 and older to request care online for non-urgent symptoms. For details visit [email or web address].   Also download the MyChart app! Go to the app store, search "MyChart", open the app, select , and log in with your MyChart username and password.  Pembrolizumab Injection What is this medication? PEMBROLIZUMAB (PEM broe LIZ ue mab) treats some types of cancer. It works by helping your immune system slow or stop the spread of cancer cells. It is a monoclonal antibody. This medicine may be used for other purposes; ask your health care provider or pharmacist if you have questions. COMMON BRAND NAME(S): Keytruda What should I tell my care team before I take this medication? They need to know if you have any of these conditions: Allogeneic stem cell transplant (uses someone else's stem cells) Autoimmune diseases, such as Crohn disease, ulcerative colitis, lupus History of chest radiation Nervous system problems, such  as Guillain-Barre syndrome, myasthenia gravis Organ transplant An unusual or allergic reaction to pembrolizumab, other medications, foods, dyes, or preservatives Pregnant or trying to get pregnant Breast-feeding How should I use this  medication? This medication is injected into a vein. It is given by your care team in a hospital or clinic setting. A special MedGuide will be given to you before each treatment. Be sure to read this information carefully each time. Talk to your care team about the use of this medication in children. While it may be prescribed for children as young as 6 months for selected conditions, precautions do apply. Overdosage: If you think you have taken too much of this medicine contact a poison control center or emergency room at once. NOTE: This medicine is only for you. Do not share this medicine with others. What if I miss a dose? Keep appointments for follow-up doses. It is important not to miss your dose. Call your care team if you are unable to keep an appointment. What may interact with this medication? Interactions have not been studied. This list may not describe all possible interactions. Give your health care provider a list of all the medicines, herbs, non-prescription drugs, or dietary supplements you use. Also tell them if you smoke, drink alcohol, or use illegal drugs. Some items may interact with your medicine. What should I watch for while using this medication? Your condition will be monitored carefully while you are receiving this medication. You may need blood work while taking this medication. This medication may cause serious skin reactions. They can happen weeks to months after starting the medication. Contact your care team right away if you notice fevers or flu-like symptoms with a rash. The rash may be red or purple and then turn into blisters or peeling of the skin. You may also notice a red rash with swelling of the face, lips, or lymph nodes in your neck or under your arms. Tell your care team right away if you have any change in your eyesight. Talk to your care team if you may be pregnant. Serious birth defects can occur if you take this medication during pregnancy and for 4  months after the last dose. You will need a negative pregnancy test before starting this medication. Contraception is recommended while taking this medication and for 4 months after the last dose. Your care team can help you find the option that works for you. Do not breastfeed while taking this medication and for 4 months after the last dose. What side effects may I notice from receiving this medication? Side effects that you should report to your care team as soon as possible: Allergic reactions--skin rash, itching, hives, swelling of the face, lips, tongue, or throat Dry cough, shortness of breath or trouble breathing Eye pain, redness, irritation, or discharge with blurry or decreased vision Heart muscle inflammation--unusual weakness or fatigue, shortness of breath, chest pain, fast or irregular heartbeat, dizziness, swelling of the ankles, feet, or hands Hormone gland problems--headache, sensitivity to light, unusual weakness or fatigue, dizziness, fast or irregular heartbeat, increased sensitivity to cold or heat, excessive sweating, constipation, hair loss, increased thirst or amount of urine, tremors or shaking, irritability Infusion reactions--chest pain, shortness of breath or trouble breathing, feeling faint or lightheaded Kidney injury (glomerulonephritis)--decrease in the amount of urine, red or dark brown urine, foamy or bubbly urine, swelling of the ankles, hands, or feet Liver injury--right upper belly pain, loss of appetite, nausea, light-colored stool, dark yellow or brown urine, yellowing  skin or eyes, unusual weakness or fatigue Pain, tingling, or numbness in the hands or feet, muscle weakness, change in vision, confusion or trouble speaking, loss of balance or coordination, trouble walking, seizures Rash, fever, and swollen lymph nodes Redness, blistering, peeling, or loosening of the skin, including inside the mouth Sudden or severe stomach pain, bloody diarrhea, fever, nausea,  vomiting Side effects that usually do not require medical attention (report to your care team if they continue or are bothersome): Bone, joint, or muscle pain Diarrhea Fatigue Loss of appetite Nausea Skin rash This list may not describe all possible side effects. Call your doctor for medical advice about side effects. You may report side effects to FDA at 1-800-FDA-1088. Where should I keep my medication? This medication is given in a hospital or clinic. It will not be stored at home. NOTE: This sheet is a summary. It may not cover all possible information. If you have questions about this medicine, talk to your doctor, pharmacist, or health care provider.  2023 Elsevier/Gold Standard (2013-02-21 00:00:00) Pemetrexed Injection What is this medication? PEMETREXED (PEM e TREX ed) treats some types of cancer. It works by slowing down the growth of cancer cells. This medicine may be used for other purposes; ask your health care provider or pharmacist if you have questions. COMMON BRAND NAME(S): Alimta, PEMFEXY What should I tell my care team before I take this medication? They need to know if you have any of these conditions: Infection, such as chickenpox, cold sores, or herpes Kidney disease Low blood cell levels (white cells, red cells, and platelets) Lung or breathing disease, such as asthma Radiation therapy An unusual or allergic reaction to pemetrexed, other medications, foods, dyes, or preservatives If you or your partner are pregnant or trying to get pregnant Breast-feeding How should I use this medication? This medication is injected into a vein. It is given by your care team in a hospital or clinic setting. Talk to your care team about the use of this medication in children. Special care may be needed. Overdosage: If you think you have taken too much of this medicine contact a poison control center or emergency room at once. NOTE: This medicine is only for you. Do not share  this medicine with others. What if I miss a dose? Keep appointments for follow-up doses. It is important not to miss your dose. Call your care team if you are unable to keep an appointment. What may interact with this medication? Do not take this medication with any of the following: Live virus vaccines This medication may also interact with the following: Ibuprofen This list may not describe all possible interactions. Give your health care provider a list of all the medicines, herbs, non-prescription drugs, or dietary supplements you use. Also tell them if you smoke, drink alcohol, or use illegal drugs. Some items may interact with your medicine. What should I watch for while using this medication? Your condition will be monitored carefully while you are receiving this medication. This medication may make you feel generally unwell. This is not uncommon as chemotherapy can affect healthy cells as well as cancer cells. Report any side effects. Continue your course of treatment even though you feel ill unless your care team tells you to stop. This medication can cause serious side effects. To reduce the risk, your care team may give you other medications to take before receiving this one. Be sure to follow the directions from your care team. This medication can cause a rash  or redness in areas of the body that have previously had radiation therapy. If you have had radiation therapy, tell your care team if you notice a rash in this area. This medication may increase your risk of getting an infection. Call your care team for advice if you get a fever, chills, sore throat, or other symptoms of a cold or flu. Do not treat yourself. Try to avoid being around people who are sick. Be careful brushing or flossing your teeth or using a toothpick because you may get an infection or bleed more easily. If you have any dental work done, tell your dentist you are receiving this medication. Avoid taking medications  that contain aspirin, acetaminophen, ibuprofen, naproxen, or ketoprofen unless instructed by your care team. These medications may hide a fever. Check with your care team if you have severe diarrhea, nausea, and vomiting, or if you sweat a lot. The loss of too much body fluid may make it dangerous for you to take this medication. Talk to your care team if you or your partner wish to become pregnant or think either of you might be pregnant. This medication can cause serious birth defects if taken during pregnancy and for 6 months after the last dose. A negative pregnancy test is required before starting this medication. A reliable form of contraception is recommended while taking this medication and for 6 months after the last dose. Talk to your care team about reliable forms of contraception. Do not father a child while taking this medication and for 3 months after the last dose. Use a condom while having sex during this time period. Do not breastfeed while taking this medication and for 1 week after the last dose. This medication may cause infertility. Talk to your care team if you are concerned about your fertility. What side effects may I notice from receiving this medication? Side effects that you should report to your care team as soon as possible: Allergic reactions--skin rash, itching, hives, swelling of the face, lips, tongue, or throat Dry cough, shortness of breath or trouble breathing Infection--fever, chills, cough, sore throat, wounds that don't heal, pain or trouble when passing urine, general feeling of discomfort or being unwell Kidney injury--decrease in the amount of urine, swelling of the ankles, hands, or feet Low red blood cell level--unusual weakness or fatigue, dizziness, headache, trouble breathing Redness, blistering, peeling, or loosening of the skin, including inside the mouth Unusual bruising or bleeding Side effects that usually do not require medical attention (report to  your care team if they continue or are bothersome): Fatigue Loss of appetite Nausea Vomiting This list may not describe all possible side effects. Call your doctor for medical advice about side effects. You may report side effects to FDA at 1-800-FDA-1088. Where should I keep my medication? This medication is given in a hospital or clinic. It will not be stored at home. NOTE: This sheet is a summary. It may not cover all possible information. If you have questions about this medicine, talk to your doctor, pharmacist, or health care provider.  2023 Elsevier/Gold Standard (2021-10-07 00:00:00)

## 2022-07-04 ENCOUNTER — Other Ambulatory Visit: Payer: Self-pay | Admitting: Radiation Therapy

## 2022-07-04 DIAGNOSIS — C7931 Secondary malignant neoplasm of brain: Secondary | ICD-10-CM

## 2022-07-17 NOTE — Progress Notes (Signed)
[facility] OFFICE PROGRESS NOTE  [name], MD [place] [address]  DIAGNOSIS: Stage IV (T3, N2, M1c) non-small cell lung cancer favoring adenocarcinoma presented with right upper lobe lung mass in addition to right lower lobe, left lower lobe in addition to right hilar and mediastinal lymphadenopathy in addition to metastatic brain lesions diagnosed in October 2021.   Molecular studies by [employer]:   [id number], 9.5%,   PRIOR THERAPY:  1) SRS to 3 brain lesion under the care of Dr. Lisbeth Renshaw.  Completed on April 20, 2020 2) SRS to the recurrent brain lesion under the care of Dr. Lisbeth Renshaw. Completed on 02/12/22  CURRENT THERAPY: Systemic chemotherapy with carboplatin for AUC of 5, Alimta 500 mg/M2 and Keytruda 200 mg IV every 3 weeks.  First dose April 24, 2020.  Status post 39 cycles.  Starting from cycle #5 the patient will be on maintenance treatment with Alimta and Keytruda every 3 weeks.   INTERVAL HISTORY: Nicolas Allen 67 y.o. male returns to the clinic today for a follow-up visit. The patient is feeling fairly well today without any concerning complaints except  he has a cold for about a week. He thinks it is improving. He reports nasal congestion. He did not test himself for covid. He denies associated fevers, chills, shortness of breath, or cough. Denies nausea, vomiting, or malaise. He denies sick contacts.   He is currently undergoing treatment with alimta and [place]. He tolerated his last cycle well without any concerning adverse side effects. He denies any unexplained weight loss.  Denies any nausea, diarrhea, or vomiting. He takes stool softener for constipation. Denies any headache or visual changes. He completed SRS re-treatment to the brain lesion under the care of Dr. Lisbeth Renshaw on 02/12/22.  He recently had a restaging CT scan performed.  He is here today for evaluation to review his scan results before starting cycle #40.   MEDICAL  HISTORY: Past Medical History:  Diagnosis Date   Anemia 06/24/2012   ANXIETY 02/03/2007   Cervical disc disease 02/12/2012   S/p surgury jan 2013   Erectile dysfunction 02/11/2011   GERD (gastroesophageal reflux disease)    GLUCOSE INTOLERANCE 01/04/2008   HYPERLIPIDEMIA 02/03/2007   HYPERTENSION 02/03/2007   IBS (irritable bowel syndrome)    Impaired glucose tolerance 02/11/2011   nscl ca 03/30/2020   Smoker 08/22/2014   Substance abuse ([place]) 2001   sober for 16 yrs    ALLERGIES:  has No Known Allergies.  MEDICATIONS:  Current Outpatient Medications  Medication Sig Dispense Refill   azithromycin (ZITHROMAX Z-PAK) 250 MG tablet Use as directed 6 each 0   amLODipine-benazepril (LOTREL) 10-20 MG capsule Take 1 capsule by mouth once daily 90 capsule 3   aspirin 325 MG EC tablet Take 1 tablet (325 mg total) by mouth daily. 90 tablet 99   b complex vitamins tablet Take 1 tablet by mouth daily.     cholecalciferol (VITAMIN D3) 25 MCG (1000 UNIT) tablet Take 1,000 Units by mouth daily.     folic acid (FOLVITE) 1 MG tablet Take 1 tablet by mouth once daily 90 tablet 3   Garlic 0938 MG CAPS Take 1,000 mg by mouth daily.      lidocaine-prilocaine (EMLA) cream Apply to the Port-A-Cath site 30-60 minutes before chemotherapy. 30 g 0   Omega-3 Fatty Acids (FISH OIL PO) Take 1 capsule by mouth daily.      polyethylene glycol (MIRALAX / GLYCOLAX) 17  g packet Take 17 g by mouth daily as needed.     prochlorperazine (COMPAZINE) 10 MG tablet Take 1 tablet (10 mg total) by mouth every 6 (six) hours as needed for nausea or vomiting. 30 tablet 0   rosuvastatin (CRESTOR) 40 MG tablet Take 1 tablet (40 mg total) by mouth daily. 90 tablet 3   vardenafil (LEVITRA) 20 MG tablet TAKE 1 TABLET BY MOUTH AS NEEDED FOR  ERECTILE  DYSFUNCTION 10 tablet 5   No current facility-administered medications for this visit.    SURGICAL HISTORY:  Past Surgical History:  Procedure Laterality Date   BRONCHIAL  BIOPSY  03/30/2020   Procedure: BRONCHIAL BIOPSIES;  Surgeon: Garner Nash, DO;  Location: [facility];  Service: Pulmonary;;   BRONCHIAL BRUSHINGS  03/30/2020   Procedure: BRONCHIAL BRUSHINGS;  Surgeon: Garner Nash, DO;  Location: [facility];  Service: Pulmonary;;   BRONCHIAL NEEDLE ASPIRATION BIOPSY  03/30/2020   Procedure: BRONCHIAL NEEDLE ASPIRATION BIOPSIES;  Surgeon: Garner Nash, DO;  Location: [facility];  Service: Pulmonary;;   BRONCHIAL WASHINGS  03/30/2020   Procedure: BRONCHIAL WASHINGS;  Surgeon: Garner Nash, DO;  Location: [facility];  Service: Pulmonary;;   COLONOSCOPY  2007   IR IMAGING GUIDED PORT INSERTION  04/23/2020   neck fusion  2012   C4   NO PAST SURGERIES     VIDEO BRONCHOSCOPY WITH ENDOBRONCHIAL NAVIGATION N/A 03/30/2020   Procedure: VIDEO BRONCHOSCOPY WITH ENDOBRONCHIAL NAVIGATION;  Surgeon: Garner Nash, DO;  Location: [facility];  Service: Pulmonary;  Laterality: N/A;   VIDEO BRONCHOSCOPY WITH ENDOBRONCHIAL ULTRASOUND N/A 03/30/2020   Procedure: VIDEO BRONCHOSCOPY WITH ENDOBRONCHIAL ULTRASOUND;  Surgeon: Garner Nash, DO;  Location: [facility];  Service: Pulmonary;  Laterality: N/A;    REVIEW OF SYSTEMS:   onstitutional: Negative for appetite change, chills, fatigue, fever and unexpected weight change.  HENT: Positive for nasal congestion. Negative for mouth sores, nosebleeds, sore throat and trouble swallowing.   Eyes: Negative for eye problems and icterus.  Respiratory: Negative for cough, hemoptysis, shortness of breath and wheezing.   Cardiovascular: Negative for chest pain and leg swelling.  Gastrointestinal: Negative for abdominal pain, constipation, diarrhea, nausea and vomiting.  Genitourinary: Negative for bladder incontinence, difficulty urinating, dysuria, frequency and hematuria.   Musculoskeletal: Negative for back pain, gait problem, neck pain and neck stiffness.  Skin: Negative for itching and rash.   Neurological: Negative for dizziness, extremity weakness, gait problem, headaches, light-headedness and seizures.  Hematological: Negative for adenopathy. Does not bruise/bleed easily.  Psychiatric/Behavioral: Negative for confusion, depression and sleep disturbance. The patient is not nervous/anxious.    PHYSICAL EXAMINATION:  Blood pressure 104/68, pulse 79, temperature (!) 97.4 F (36.3 C), temperature source Oral, resp. rate 14, height 6' (1.829 m), weight 171 lb 1.6 oz (77.6 kg), SpO2 100 %.  ECOG PERFORMANCE STATUS: 1  Physical Exam  Constitutional: Oriented to person, place, and time and well-developed, well-nourished, and in no distress. HENT:  Head: Normocephalic and atraumatic.  Mouth/Throat: Oropharynx is clear and moist. No oropharyngeal exudate.  Eyes: Conjunctivae are normal. Right eye exhibits no discharge. Left eye exhibits no discharge. No scleral icterus.  Neck: Normal range of motion. Neck supple.  Cardiovascular: Normal rate, regular rhythm, normal heart sounds and intact distal pulses.   Pulmonary/Chest: Effort normal and breath sounds norma except mild diminished breath sounds base of right lung. No respiratory distress. No wheezes. No rales.  Abdominal: Soft. Bowel sounds are normal. Exhibits no distension and no mass.  There is no tenderness.  Musculoskeletal: Normal range of motion. Exhibits no edema.   Lymphadenopathy:    No cervical adenopathy.  Neurological: Alert and oriented to person, place, and time. Exhibits normal muscle tone. Gait normal. Coordination normal.  Skin: Skin is warm and dry. No rash noted. Not diaphoretic. No erythema. No pallor.  Psychiatric: Mood, memory and judgment normal.  Vitals reviewed.  LABORATORY DATA: Lab Results  Component Value Date   WBC 4.0 07/22/2022   HGB 10.7 (L) 07/22/2022   HCT 30.6 (L) 07/22/2022   MCV 84.5 07/22/2022   PLT 188 07/22/2022      Chemistry      Component Value Date/Time   NA 138 07/22/2022  0954   K 3.5 07/22/2022 0954   CL 106 07/22/2022 0954   CO2 23 07/22/2022 0954   BUN 10 07/22/2022 0954   CREATININE 1.24 07/22/2022 0954   CREATININE 0.88 02/21/2020 1115      Component Value Date/Time   CALCIUM 8.4 (L) 07/22/2022 0954   ALKPHOS 50 07/22/2022 0954   AST 30 07/22/2022 0954   ALT 19 07/22/2022 0954   BILITOT 0.2 (L) 07/22/2022 0954       RADIOGRAPHIC STUDIES:  CT Chest W Contrast  Result Date: 07/18/2022 CLINICAL DATA:  Non-small-cell lung cancer. Restaging. * Tracking Code: BO * EXAM: CT CHEST, ABDOMEN, AND PELVIS WITH CONTRAST TECHNIQUE: Multidetector CT imaging of the chest, abdomen and pelvis was performed following the standard protocol during bolus administration of intravenous contrast. RADIATION DOSE REDUCTION: This exam was performed according to the departmental dose-optimization program which includes automated exposure control, adjustment of the mA and/or kV according to patient size and/or use of iterative reconstruction technique. CONTRAST:  149mL OMNIPAQUE IOHEXOL 300 MG/ML  SOLN COMPARISON:  04/25/2022 FINDINGS: CT CHEST FINDINGS Cardiovascular: The heart size is normal. No substantial pericardial effusion. Mild atherosclerotic calcification is noted in the wall of the thoracic aorta. Right Port-A-Cath tip is positioned at the SVC/RA junction. Mediastinum/Nodes: No mediastinal lymphadenopathy. There is no hilar lymphadenopathy. The esophagus has normal imaging features. There is no axillary lymphadenopathy. Lungs/Pleura: Centrilobular and paraseptal emphysema evident. Similar bilateral ill-defined irregular nodular lesions in both lungs. Measurements did not persist on the previous exam and have been remeasured today for comparison purposes. Index posterior right upper lobe nodule on 40/6 is 10 mm today compared to 11 mm previously. Index right retro hilar lesion measuring 2 cm today on 74/6 was 2.2 cm previously. Index mixed attenuation lesion right lower lobe on  97/6 is 2.5 cm today, unchanged in the interval. Index nodule anterior right middle lobe is stable at 4 mm. Mixed attenuation lesion left lower lobe measures 3.3 x 2.8 cm on image 117/6 today which compares to 3.5 x 2.9 cm previously. Moderate right and small left pleural effusions are progressive in the interval. Musculoskeletal: No worrisome lytic or sclerotic osseous abnormality. CT ABDOMEN PELVIS FINDINGS Hepatobiliary: No suspicious focal abnormality within the liver parenchyma. The tiny hypodensity seen previously in segment II of the liver is not visible on today's exam. There is no evidence for gallstones, gallbladder wall thickening, or pericholecystic fluid. No intrahepatic or extrahepatic biliary dilation. Pancreas: No focal mass lesion. No dilatation of the main duct. No intraparenchymal cyst. No peripancreatic edema. Spleen: No splenomegaly. No focal mass lesion. Adrenals/Urinary Tract: No adrenal nodule or mass. Cortical scarring noted both kidneys without hydronephrosis. No evidence for hydroureter. Mild bladder wall thickening likely accentuated by underdistention. Stomach/Bowel: Stomach is unremarkable. No gastric wall thickening.  No evidence of outlet obstruction. Duodenum is normally positioned as is the ligament of Treitz. No small bowel wall thickening. No small bowel dilatation. The terminal ileum is normal. The appendix is normal. No gross colonic mass. No colonic wall thickening. Vascular/Lymphatic: There is mild atherosclerotic calcification of the abdominal aorta without aneurysm. There is no gastrohepatic or hepatoduodenal ligament lymphadenopathy. No retroperitoneal or mesenteric lymphadenopathy. No pelvic sidewall lymphadenopathy. Reproductive: Unremarkable. Other: Subtle presacral edema is stable. Trace free fluid noted in the pelvis. Musculoskeletal: No worrisome lytic or sclerotic osseous abnormality. Small sclerotic focus in the left sacrum is stable, likely a bone island.  IMPRESSION: 1. Multiple bilateral pulmonary lesions again noted, showing no substantial interval change. No new pulmonary lesion evident on today's study. 2. Moderate right and small left pleural effusions are progressive in the interval. 3. No evidence for metastatic disease in the abdomen or pelvis. 4. Aortic Atherosclerosis (ICD10-I70.0) and Emphysema (ICD10-J43.9). Electronically Signed   By: Misty Stanley M.D.   On: 07/18/2022 09:15   CT Abdomen Pelvis W Contrast  Result Date: 07/18/2022 CLINICAL DATA:  Non-small-cell lung cancer. Restaging. * Tracking Code: BO * EXAM: CT CHEST, ABDOMEN, AND PELVIS WITH CONTRAST TECHNIQUE: Multidetector CT imaging of the chest, abdomen and pelvis was performed following the standard protocol during bolus administration of intravenous contrast. RADIATION DOSE REDUCTION: This exam was performed according to the departmental dose-optimization program which includes automated exposure control, adjustment of the mA and/or kV according to patient size and/or use of iterative reconstruction technique. CONTRAST:  135mL OMNIPAQUE IOHEXOL 300 MG/ML  SOLN COMPARISON:  04/25/2022 FINDINGS: CT CHEST FINDINGS Cardiovascular: The heart size is normal. No substantial pericardial effusion. Mild atherosclerotic calcification is noted in the wall of the thoracic aorta. Right Port-A-Cath tip is positioned at the SVC/RA junction. Mediastinum/Nodes: No mediastinal lymphadenopathy. There is no hilar lymphadenopathy. The esophagus has normal imaging features. There is no axillary lymphadenopathy. Lungs/Pleura: Centrilobular and paraseptal emphysema evident. Similar bilateral ill-defined irregular nodular lesions in both lungs. Measurements did not persist on the previous exam and have been remeasured today for comparison purposes. Index posterior right upper lobe nodule on 40/6 is 10 mm today compared to 11 mm previously. Index right retro hilar lesion measuring 2 cm today on 74/6 was 2.2 cm  previously. Index mixed attenuation lesion right lower lobe on 97/6 is 2.5 cm today, unchanged in the interval. Index nodule anterior right middle lobe is stable at 4 mm. Mixed attenuation lesion left lower lobe measures 3.3 x 2.8 cm on image 117/6 today which compares to 3.5 x 2.9 cm previously. Moderate right and small left pleural effusions are progressive in the interval. Musculoskeletal: No worrisome lytic or sclerotic osseous abnormality. CT ABDOMEN PELVIS FINDINGS Hepatobiliary: No suspicious focal abnormality within the liver parenchyma. The tiny hypodensity seen previously in segment II of the liver is not visible on today's exam. There is no evidence for gallstones, gallbladder wall thickening, or pericholecystic fluid. No intrahepatic or extrahepatic biliary dilation. Pancreas: No focal mass lesion. No dilatation of the main duct. No intraparenchymal cyst. No peripancreatic edema. Spleen: No splenomegaly. No focal mass lesion. Adrenals/Urinary Tract: No adrenal nodule or mass. Cortical scarring noted both kidneys without hydronephrosis. No evidence for hydroureter. Mild bladder wall thickening likely accentuated by underdistention. Stomach/Bowel: Stomach is unremarkable. No gastric wall thickening. No evidence of outlet obstruction. Duodenum is normally positioned as is the ligament of Treitz. No small bowel wall thickening. No small bowel dilatation. The terminal ileum is normal. The appendix is  normal. No gross colonic mass. No colonic wall thickening. Vascular/Lymphatic: There is mild atherosclerotic calcification of the abdominal aorta without aneurysm. There is no gastrohepatic or hepatoduodenal ligament lymphadenopathy. No retroperitoneal or mesenteric lymphadenopathy. No pelvic sidewall lymphadenopathy. Reproductive: Unremarkable. Other: Subtle presacral edema is stable. Trace free fluid noted in the pelvis. Musculoskeletal: No worrisome lytic or sclerotic osseous abnormality. Small sclerotic focus  in the left sacrum is stable, likely a bone island. IMPRESSION: 1. Multiple bilateral pulmonary lesions again noted, showing no substantial interval change. No new pulmonary lesion evident on today's study. 2. Moderate right and small left pleural effusions are progressive in the interval. 3. No evidence for metastatic disease in the abdomen or pelvis. 4. Aortic Atherosclerosis (ICD10-I70.0) and Emphysema (ICD10-J43.9). Electronically Signed   By: Misty Stanley M.D.   On: 07/18/2022 09:15     ASSESSMENT/PLAN:  This is a very pleasant 67 year old African-American male diagnosed with stage IV (T3, N2, M1C) non-small cell lung cancer, adenocarcinoma.  He presented with a right upper lobe lung mass in addition to a right lower lobe, left lower lobe lung lesions with right hilar and mediastinal lymphadenopathy.  He also had metastatic disease to the brain.  He was diagnosed in October 2022.  His molecular studies by [employer] did not show any actionable mutations.    The patient underwent SRS treatment to the metastatic brain lesions under the care of Dr. Lisbeth Renshaw.  This was completed on 04/20/2021.    He completed re-treatment SRS to the progressive brain lesion in the cerebellum under the care of Dr. Lisbeth Renshaw on 02/12/22.    The patient is currently undergoing systemic chemotherapy/immunotherapy.  He started with carboplatin for an AUC of 5, Alimta 500 mg per metered squared, Keytruda 200 mg IV every 3 weeks.  He is status post 39 cycles.  Starting from cycle #5, the patient has been on maintenance treatment with Alimta and Keytruda IV every 3 weeks. Starting from cycle #22, the patient has been on single agent alimta. [name] has been on hold due to CT findings of suspicious immunotherapy mediated enteritis. [name] was resumed with cycle #24 and he has been tolerating it well    The patient recently had a restaging CT scan performed.  Dr. Julien Nordmann personally and independently reviewed the scan discussed  results with the patient today.  The scan showed no evidence of disease progression. He does have some increase in his right pleural effusion. He is asymptomatic. Dr. Julien Nordmann recommends that the patient continue on the same treatment at the same dose.  Her total WBC is 4.0. her ANC  is 1.1. Reviewed with Dr. Julien Nordmann. Per Dr. Julien Nordmann, he is ok to treat. However, should he develop any new or worsening signs or symptoms of infection in the interval, he will need to call the clinic sooner to be re-evaluated. Neutropenic precautions will be reviewed with the patient in the infusion room today.   I did review signs and symptoms with the patient of progressive pleural effusions such as increased cough, shortness of breath, chest heaviness, etc. should he develop any worsening symptoms in the interval, I discussed with the patient that we can arrange for this to be drained via thoracentesis.  Since he is asymptomatic we will not arrange for any thoracentesis at this time.  We will see him back for follow-up visit in 3 weeks for evaluation repeat blood work before undergoing cycle #41.  The patient has had an ongoing sinus infection for approximately 1 week.  I  will send him azithromycin to the pharmacy in the event that there is some bacterial component.  Will continue with his supportive care at home.   The patient was advised to call immediately if he has any concerning symptoms in the interval. The patient voices understanding of current disease status and treatment options and is in agreement with the current care plan. All questions were answered. The patient knows to call the clinic with any problems, questions or concerns. We can certainly see the patient much sooner if necessary  No orders of the defined types were placed in this encounter.   Everlina Gotts L Shenay Torti, PA-C 07/22/22  ADDENDUM: Hematology/Oncology Attending: I had a face to face encounter with the patient today.  I reviewed his  record, lab, scan and recommended his care plan.  This is a very pleasant 67 years old African-American male with a stage IV non-small cell lung cancer, adenocarcinoma with no actionable mutations.  He is status post SRS to brain metastasis.  The patient started systemic chemotherapy initially with carboplatin, Alimta and Keytruda for 4 cycles and he has been on maintenance treatment with Alimta and Keytruda for 35 cycles and has been tolerating this treatment fairly well.  He has mild fatigue and chest congestion recently. He had repeat CT scan of the chest, abdomen and pelvis performed recently.  I personally and independently reviewed the scan and discussed the result with the patient today. His scan showed no concerning findings for disease progression.  He has moderate right and small left pleural effusion that are progressive in the interval and we will continue to monitor it closely send The patient is currently asymptomatic. I recommended for him to continue his current maintenance therapy and he will proceed with cycle #40 today. I will see him back for follow-up visit in 3 weeks for evaluation before the next cycle of his treatment. The patient was advised to call immediately if he has any concerning symptoms in the interval. The total time spent in the appointment was 30 minutes. Disclaimer: This note was dictated with voice recognition software. Similar sounding words can inadvertently be transcribed and may be missed upon review. Eilleen Kempf, MD

## 2022-07-18 ENCOUNTER — Encounter (HOSPITAL_COMMUNITY): Payer: Self-pay

## 2022-07-18 ENCOUNTER — Ambulatory Visit (HOSPITAL_COMMUNITY)
Admission: RE | Admit: 2022-07-18 | Discharge: 2022-07-18 | Disposition: A | Discharge status: Home / Self Care | Payer: Medicare Other | Source: Ambulatory Visit | Attending: Internal Medicine | Admitting: Internal Medicine

## 2022-07-18 DIAGNOSIS — C3490 Malignant neoplasm of unspecified part of unspecified bronchus or lung: Secondary | ICD-10-CM | POA: Diagnosis not present

## 2022-07-18 DIAGNOSIS — J439 Emphysema, unspecified: Secondary | ICD-10-CM | POA: Diagnosis not present

## 2022-07-18 DIAGNOSIS — J90 Pleural effusion, not elsewhere classified: Secondary | ICD-10-CM | POA: Diagnosis not present

## 2022-07-18 DIAGNOSIS — R188 Other ascites: Secondary | ICD-10-CM | POA: Diagnosis not present

## 2022-07-18 DIAGNOSIS — N3289 Other specified disorders of bladder: Secondary | ICD-10-CM | POA: Diagnosis not present

## 2022-07-18 MED ORDER — HEPARIN SOD (PORK) LOCK FLUSH 100 UNIT/ML IV SOLN
INTRAVENOUS | Status: AC
  Administered 2022-07-18: 500 [IU] via INTRAVENOUS
  Filled 2022-07-18: qty 5

## 2022-07-18 MED ORDER — IOHEXOL 300 MG/ML  SOLN
100.0000 mL | Freq: Once | INTRAMUSCULAR | Status: AC | PRN
  Administered 2022-07-18: 100 mL via INTRAVENOUS

## 2022-07-18 MED ORDER — SODIUM CHLORIDE (PF) 0.9 % IJ SOLN
INTRAMUSCULAR | Status: AC
  Filled 2022-07-18: qty 50

## 2022-07-18 MED ORDER — HEPARIN SOD (PORK) LOCK FLUSH 100 UNIT/ML IV SOLN: 500.0000 [IU] | Freq: Once | INTRAVENOUS | Status: AC

## 2022-07-22 ENCOUNTER — Other Ambulatory Visit: Payer: Self-pay

## 2022-07-22 ENCOUNTER — Inpatient Hospital Stay: Payer: Medicare Other | Attending: Internal Medicine | Admitting: Physician Assistant

## 2022-07-22 ENCOUNTER — Inpatient Hospital Stay: Payer: Medicare Other

## 2022-07-22 VITALS — BP 103/60 | HR 66 | Resp 16

## 2022-07-22 VITALS — BP 104/68 | HR 79 | Temp 97.4°F | Resp 14 | Ht 72.0 in | Wt 171.1 lb

## 2022-07-22 DIAGNOSIS — Z95828 Presence of other vascular implants and grafts: Secondary | ICD-10-CM

## 2022-07-22 DIAGNOSIS — J0191 Acute recurrent sinusitis, unspecified: Secondary | ICD-10-CM | POA: Diagnosis not present

## 2022-07-22 DIAGNOSIS — Z5111 Encounter for antineoplastic chemotherapy: Secondary | ICD-10-CM | POA: Diagnosis not present

## 2022-07-22 DIAGNOSIS — C3491 Malignant neoplasm of unspecified part of right bronchus or lung: Secondary | ICD-10-CM

## 2022-07-22 DIAGNOSIS — Z79899 Other long term (current) drug therapy: Secondary | ICD-10-CM | POA: Diagnosis not present

## 2022-07-22 DIAGNOSIS — Z87891 Personal history of nicotine dependence: Secondary | ICD-10-CM | POA: Insufficient documentation

## 2022-07-22 DIAGNOSIS — C3411 Malignant neoplasm of upper lobe, right bronchus or lung: Secondary | ICD-10-CM | POA: Insufficient documentation

## 2022-07-22 DIAGNOSIS — C7931 Secondary malignant neoplasm of brain: Secondary | ICD-10-CM | POA: Insufficient documentation

## 2022-07-22 DIAGNOSIS — Z5112 Encounter for antineoplastic immunotherapy: Secondary | ICD-10-CM | POA: Diagnosis not present

## 2022-07-22 LAB — CBC WITH DIFFERENTIAL (CANCER CENTER ONLY)
Abs Immature Granulocytes: 0.02 10*3/uL (ref 0.00–0.07)
Basophils Absolute: 0 10*3/uL (ref 0.0–0.1)
Basophils Relative: 1 %
Eosinophils Absolute: 0.1 10*3/uL (ref 0.0–0.5)
Eosinophils Relative: 2 %
HCT: 30.6 % — ABNORMAL LOW (ref 39.0–52.0)
Hemoglobin: 10.7 g/dL — ABNORMAL LOW (ref 13.0–17.0)
Immature Granulocytes: 1 %
Lymphocytes Relative: 56 %
Lymphs Abs: 2.2 10*3/uL (ref 0.7–4.0)
MCH: 29.6 pg (ref 26.0–34.0)
MCHC: 35 g/dL (ref 30.0–36.0)
MCV: 84.5 fL (ref 80.0–100.0)
Monocytes Absolute: 0.5 10*3/uL (ref 0.1–1.0)
Monocytes Relative: 11 %
Neutro Abs: 1.1 10*3/uL — ABNORMAL LOW (ref 1.7–7.7)
Neutrophils Relative %: 29 %
Platelet Count: 188 10*3/uL (ref 150–400)
RBC: 3.62 MIL/uL — ABNORMAL LOW (ref 4.22–5.81)
RDW: 15.7 % — ABNORMAL HIGH (ref 11.5–15.5)
WBC Count: 4 10*3/uL (ref 4.0–10.5)
nRBC: 0 % (ref 0.0–0.2)

## 2022-07-22 LAB — CMP (CANCER CENTER ONLY)
ALT: 19 U/L (ref 0–44)
AST: 30 U/L (ref 15–41)
Albumin: 3 g/dL — ABNORMAL LOW (ref 3.5–5.0)
Alkaline Phosphatase: 50 U/L (ref 38–126)
Anion gap: 9 (ref 5–15)
BUN: 10 mg/dL (ref 8–23)
CO2: 23 mmol/L (ref 22–32)
Calcium: 8.4 mg/dL — ABNORMAL LOW (ref 8.9–10.3)
Chloride: 106 mmol/L (ref 98–111)
Creatinine: 1.24 mg/dL (ref 0.61–1.24)
GFR, Estimated: >60 mL/min (ref >60)
Glucose, Bld: 112 mg/dL — ABNORMAL HIGH (ref 70–99)
Potassium: 3.5 mmol/L (ref 3.5–5.1)
Sodium: 138 mmol/L (ref 135–145)
Total Bilirubin: 0.2 mg/dL — ABNORMAL LOW (ref 0.3–1.2)
Total Protein: 6.3 g/dL — ABNORMAL LOW (ref 6.5–8.1)

## 2022-07-22 MED ORDER — PROCHLORPERAZINE MALEATE 10 MG PO TABS
10.0000 mg | ORAL_TABLET | Freq: Once | ORAL | Status: AC
  Administered 2022-07-22: 10 mg via ORAL
  Filled 2022-07-22: qty 1

## 2022-07-22 MED ORDER — AZITHROMYCIN 250 MG PO TABS: ORAL_TABLET | ORAL | 0 refills | Status: DC

## 2022-07-22 MED ORDER — SODIUM CHLORIDE 0.9 % IV SOLN
500.0000 mg/m2 | Freq: Once | INTRAVENOUS | Status: AC
  Administered 2022-07-22: 1000 mg via INTRAVENOUS
  Filled 2022-07-22: qty 40

## 2022-07-22 MED ORDER — SODIUM CHLORIDE 0.9 % IV SOLN: Freq: Once | INTRAVENOUS | Status: AC

## 2022-07-22 MED ORDER — HEPARIN SOD (PORK) LOCK FLUSH 100 UNIT/ML IV SOLN
500.0000 [IU] | Freq: Once | INTRAVENOUS | Status: AC | PRN
  Administered 2022-07-22: 500 [IU]

## 2022-07-22 MED ORDER — SODIUM CHLORIDE 0.9% FLUSH
10.0000 mL | INTRAVENOUS | Status: DC | PRN
  Administered 2022-07-22: 10 mL

## 2022-07-22 MED ORDER — SODIUM CHLORIDE 0.9% FLUSH
10.0000 mL | Freq: Once | INTRAVENOUS | Status: AC
  Administered 2022-07-22: 10 mL

## 2022-07-22 MED ORDER — SODIUM CHLORIDE 0.9 % IV SOLN
200.0000 mg | Freq: Once | INTRAVENOUS | Status: AC
  Administered 2022-07-22: 200 mg via INTRAVENOUS
  Filled 2022-07-22: qty 8

## 2022-07-22 NOTE — Progress Notes (Signed)
Ok to treat today with ANC of 1.1 per Dr Julien Nordmann

## 2022-07-22 NOTE — Patient Instructions (Signed)
[facility]  Discharge Instructions: Thank you for choosing [facility] to provide your oncology and hematology care.   If you have a lab appointment with the [facility], please go directly to the [facility] and check in at the registration area.   Wear comfortable clothing and clothing appropriate for easy access to any Portacath or PICC line.   We strive to give you quality time with your provider. You may need to reschedule your appointment if you arrive late (15 or more minutes).  Arriving late affects you and other patients whose appointments are after yours.  Also, if you miss three or more appointments without notifying the office, you may be dismissed from the clinic at the provider's discretion.      For prescription refill requests, have your pharmacy contact our office and allow 72 hours for refills to be completed.    Today you received the following chemotherapy and/or immunotherapy agents: Pembrolizumab (Keytruda) and Pemetrexed (Alimta).   To help prevent nausea and vomiting after your treatment, we encourage you to take your nausea medication as directed.  BELOW ARE SYMPTOMS THAT SHOULD BE REPORTED IMMEDIATELY: *FEVER GREATER THAN 100.4 F (38 C) OR HIGHER *CHILLS OR SWEATING *NAUSEA AND VOMITING THAT IS NOT CONTROLLED WITH YOUR NAUSEA MEDICATION *UNUSUAL SHORTNESS OF BREATH *UNUSUAL BRUISING OR BLEEDING *URINARY PROBLEMS (pain or burning when urinating, or frequent urination) *BOWEL PROBLEMS (unusual diarrhea, constipation, pain near the anus) TENDERNESS IN MOUTH AND THROAT WITH OR WITHOUT PRESENCE OF ULCERS (sore throat, sores in mouth, or a toothache) UNUSUAL RASH, SWELLING OR PAIN  UNUSUAL VAGINAL DISCHARGE OR ITCHING   Items with * indicate a potential emergency and should be followed up as soon as possible or go to the Emergency Department if any problems should occur.  Please show the CHEMOTHERAPY ALERT CARD  or IMMUNOTHERAPY ALERT CARD at check-in to the Emergency Department and triage nurse.  Should you have questions after your visit or need to cancel or reschedule your appointment, please contact [facility]  Dept: [phone]  and follow the prompts.  Office hours are 8:00 a.m. to 4:30 p.m. Monday - Friday. Please note that voicemails left after 4:00 p.m. may not be returned until the following business day.  We are closed weekends and major holidays. You have access to a nurse at all times for urgent questions. Please call the main number to the clinic Dept: [phone] and follow the prompts.   For any non-urgent questions, you may also contact your provider using MyChart. We now offer e-Visits for anyone 81 and older to request care online for non-urgent symptoms. For details visit [email or web address].   Also download the MyChart app! Go to the app store, search "MyChart", open the app, select [facility], and log in with your MyChart username and password.  Pembrolizumab Injection What is this medication? PEMBROLIZUMAB (PEM broe LIZ ue mab) treats some types of cancer. It works by helping your immune system slow or stop the spread of cancer cells. It is a monoclonal antibody. This medicine may be used for other purposes; ask your health care provider or pharmacist if you have questions. COMMON BRAND NAME(S): Keytruda What should I tell my care team before I take this medication? They need to know if you have any of these conditions: Allogeneic stem cell transplant (uses someone else's stem cells) Autoimmune diseases, such as Crohn disease, ulcerative colitis, lupus History of chest radiation  Nervous system problems, such as Guillain-Barre syndrome, myasthenia gravis Organ transplant An unusual or allergic reaction to pembrolizumab, other medications, foods, dyes, or preservatives Pregnant or trying to get pregnant Breast-feeding How should I use  this medication? This medication is injected into a vein. It is given by your care team in a hospital or clinic setting. A special MedGuide will be given to you before each treatment. Be sure to read this information carefully each time. Talk to your care team about the use of this medication in children. While it may be prescribed for children as young as 6 months for selected conditions, precautions do apply. Overdosage: If you think you have taken too much of this medicine contact a poison control center or emergency room at once. NOTE: This medicine is only for you. Do not share this medicine with others. What if I miss a dose? Keep appointments for follow-up doses. It is important not to miss your dose. Call your care team if you are unable to keep an appointment. What may interact with this medication? Interactions have not been studied. This list may not describe all possible interactions. Give your health care provider a list of all the medicines, herbs, non-prescription drugs, or dietary supplements you use. Also tell them if you smoke, drink alcohol, or use illegal drugs. Some items may interact with your medicine. What should I watch for while using this medication? Your condition will be monitored carefully while you are receiving this medication. You may need blood work while taking this medication. This medication may cause serious skin reactions. They can happen weeks to months after starting the medication. Contact your care team right away if you notice fevers or flu-like symptoms with a rash. The rash may be red or purple and then turn into blisters or peeling of the skin. You may also notice a red rash with swelling of the face, lips, or lymph nodes in your neck or under your arms. Tell your care team right away if you have any change in your eyesight. Talk to your care team if you may be pregnant. Serious birth defects can occur if you take this medication during pregnancy and for  4 months after the last dose. You will need a negative pregnancy test before starting this medication. Contraception is recommended while taking this medication and for 4 months after the last dose. Your care team can help you find the option that works for you. Do not breastfeed while taking this medication and for 4 months after the last dose. What side effects may I notice from receiving this medication? Side effects that you should report to your care team as soon as possible: Allergic reactions--skin rash, itching, hives, swelling of the face, lips, tongue, or throat Dry cough, shortness of breath or trouble breathing Eye pain, redness, irritation, or discharge with blurry or decreased vision Heart muscle inflammation--unusual weakness or fatigue, shortness of breath, chest pain, fast or irregular heartbeat, dizziness, swelling of the ankles, feet, or hands Hormone gland problems--headache, sensitivity to light, unusual weakness or fatigue, dizziness, fast or irregular heartbeat, increased sensitivity to cold or heat, excessive sweating, constipation, hair loss, increased thirst or amount of urine, tremors or shaking, irritability Infusion reactions--chest pain, shortness of breath or trouble breathing, feeling faint or lightheaded Kidney injury (glomerulonephritis)--decrease in the amount of urine, red or dark brown urine, foamy or bubbly urine, swelling of the ankles, hands, or feet Liver injury--right upper belly pain, loss of appetite, nausea, light-colored stool, dark yellow  or brown urine, yellowing skin or eyes, unusual weakness or fatigue Pain, tingling, or numbness in the hands or feet, muscle weakness, change in vision, confusion or trouble speaking, loss of balance or coordination, trouble walking, seizures Rash, fever, and swollen lymph nodes Redness, blistering, peeling, or loosening of the skin, including inside the mouth Sudden or severe stomach pain, bloody diarrhea, fever,  nausea, vomiting Side effects that usually do not require medical attention (report to your care team if they continue or are bothersome): Bone, joint, or muscle pain Diarrhea Fatigue Loss of appetite Nausea Skin rash This list may not describe all possible side effects. Call your doctor for medical advice about side effects. You may report side effects to FDA at 1-800-FDA-1088. Where should I keep my medication? This medication is given in a hospital or clinic. It will not be stored at home. NOTE: This sheet is a summary. It may not cover all possible information. If you have questions about this medicine, talk to your doctor, pharmacist, or health care provider.  2023 Elsevier/Gold Standard (2013-02-21 00:00:00) Pemetrexed Injection What is this medication? PEMETREXED (PEM e TREX ed) treats some types of cancer. It works by slowing down the growth of cancer cells. This medicine may be used for other purposes; ask your health care provider or pharmacist if you have questions. COMMON BRAND NAME(S): Alimta, PEMFEXY What should I tell my care team before I take this medication? They need to know if you have any of these conditions: Infection, such as chickenpox, cold sores, or herpes Kidney disease Low blood cell levels (white cells, red cells, and platelets) Lung or breathing disease, such as asthma Radiation therapy An unusual or allergic reaction to pemetrexed, other medications, foods, dyes, or preservatives If you or your partner are pregnant or trying to get pregnant Breast-feeding How should I use this medication? This medication is injected into a vein. It is given by your care team in a hospital or clinic setting. Talk to your care team about the use of this medication in children. Special care may be needed. Overdosage: If you think you have taken too much of this medicine contact a poison control center or emergency room at once. NOTE: This medicine is only for you. Do not  share this medicine with others. What if I miss a dose? Keep appointments for follow-up doses. It is important not to miss your dose. Call your care team if you are unable to keep an appointment. What may interact with this medication? Do not take this medication with any of the following: Live virus vaccines This medication may also interact with the following: Ibuprofen This list may not describe all possible interactions. Give your health care provider a list of all the medicines, herbs, non-prescription drugs, or dietary supplements you use. Also tell them if you smoke, drink alcohol, or use illegal drugs. Some items may interact with your medicine. What should I watch for while using this medication? Your condition will be monitored carefully while you are receiving this medication. This medication may make you feel generally unwell. This is not uncommon as chemotherapy can affect healthy cells as well as cancer cells. Report any side effects. Continue your course of treatment even though you feel ill unless your care team tells you to stop. This medication can cause serious side effects. To reduce the risk, your care team may give you other medications to take before receiving this one. Be sure to follow the directions from your care team. This medication  can cause a rash or redness in areas of the body that have previously had radiation therapy. If you have had radiation therapy, tell your care team if you notice a rash in this area. This medication may increase your risk of getting an infection. Call your care team for advice if you get a fever, chills, sore throat, or other symptoms of a cold or flu. Do not treat yourself. Try to avoid being around people who are sick. Be careful brushing or flossing your teeth or using a toothpick because you may get an infection or bleed more easily. If you have any dental work done, tell your dentist you are receiving this medication. Avoid taking  medications that contain aspirin, acetaminophen, ibuprofen, naproxen, or ketoprofen unless instructed by your care team. These medications may hide a fever. Check with your care team if you have severe diarrhea, nausea, and vomiting, or if you sweat a lot. The loss of too much body fluid may make it dangerous for you to take this medication. Talk to your care team if you or your partner wish to become pregnant or think either of you might be pregnant. This medication can cause serious birth defects if taken during pregnancy and for 6 months after the last dose. A negative pregnancy test is required before starting this medication. A reliable form of contraception is recommended while taking this medication and for 6 months after the last dose. Talk to your care team about reliable forms of contraception. Do not father a child while taking this medication and for 3 months after the last dose. Use a condom while having sex during this time period. Do not breastfeed while taking this medication and for 1 week after the last dose. This medication may cause infertility. Talk to your care team if you are concerned about your fertility. What side effects may I notice from receiving this medication? Side effects that you should report to your care team as soon as possible: Allergic reactions--skin rash, itching, hives, swelling of the face, lips, tongue, or throat Dry cough, shortness of breath or trouble breathing Infection--fever, chills, cough, sore throat, wounds that don't heal, pain or trouble when passing urine, general feeling of discomfort or being unwell Kidney injury--decrease in the amount of urine, swelling of the ankles, hands, or feet Low red blood cell level--unusual weakness or fatigue, dizziness, headache, trouble breathing Redness, blistering, peeling, or loosening of the skin, including inside the mouth Unusual bruising or bleeding Side effects that usually do not require medical attention  (report to your care team if they continue or are bothersome): Fatigue Loss of appetite Nausea Vomiting This list may not describe all possible side effects. Call your doctor for medical advice about side effects. You may report side effects to FDA at 1-800-FDA-1088. Where should I keep my medication? This medication is given in a hospital or clinic. It will not be stored at home. NOTE: This sheet is a summary. It may not cover all possible information. If you have questions about this medicine, talk to your doctor, pharmacist, or health care provider.  2023 Elsevier/Gold Standard (2021-10-07 00:00:00)

## 2022-07-23 ENCOUNTER — Other Ambulatory Visit: Payer: Self-pay | Admitting: Physician Assistant

## 2022-07-23 DIAGNOSIS — C3491 Malignant neoplasm of unspecified part of right bronchus or lung: Secondary | ICD-10-CM

## 2022-07-24 ENCOUNTER — Other Ambulatory Visit: Payer: Self-pay

## 2022-07-28 ENCOUNTER — Telehealth: Payer: Self-pay | Admitting: Internal Medicine

## 2022-07-28 NOTE — Telephone Encounter (Signed)
Called patient regarding upcoming February-April appointments. Patient is notified.

## 2022-08-01 ENCOUNTER — Other Ambulatory Visit: Payer: Self-pay

## 2022-08-07 NOTE — Progress Notes (Unsigned)
[facility] OFFICE PROGRESS NOTE  [name], MD [place] [address]  DIAGNOSIS: Stage IV (T3, N2, M1c) non-small cell lung cancer favoring adenocarcinoma presented with right upper lobe lung mass in addition to right lower lobe, left lower lobe in addition to right hilar and mediastinal lymphadenopathy in addition to metastatic brain lesions diagnosed in October 2021.   Molecular studies by [employer]:   [id number], 9.5%,   PRIOR THERAPY: 1) SRS to 3 brain lesion under the care of Dr. MLisbeth Renshaw  Completed on April 20, 2020 2) SRS to the recurrent brain lesion under the care of Dr. MLisbeth Renshaw Completed on 02/12/22  CURRENT THERAPY: Systemic chemotherapy with carboplatin for AUC of 5, Alimta 500 mg/M2 and Keytruda 200 mg IV every 3 weeks.  First dose April 24, 2020.  Status post 40 cycles.  Starting from cycle #5 the patient will be on maintenance treatment with Alimta and Keytruda every 3 weeks.    INTERVAL HISTORY: Nicolas ALLEYNE89y.o. male returns to the clinic today for a follow-up visit. The patient is feeling fairly well today without any concerning complaints.   He is currently undergoing treatment with alimta and [place] He tolerated his last cycle well without any concerning adverse side effects. He denies any fever, chills, night sweats, or unexplained weight loss.  Denies any nausea, diarrhea, or vomiting. He takes stool softener for constipation. He denies any recent constipation. Denies any chest pain, shortness of breath, or hemoptysis.  Denies any significant cough.  Denies any headache or visual changes. He completed SRS re-treatment to the brain lesion under the care of Dr. MLisbeth Renshawon 02/12/22. He is scheduled for a restaging brain MRI later this week and follow up with radiation oncology on 08/18/22. He is here today for evaluation repeat blood work before undergoing cycle #41.  MEDICAL HISTORY: Past Medical History:  Diagnosis Date    Anemia 06/24/2012   ANXIETY 02/03/2007   Cervical disc disease 02/12/2012   S/p surgury jan 2013   Erectile dysfunction 02/11/2011   GERD (gastroesophageal reflux disease)    GLUCOSE INTOLERANCE 01/04/2008   HYPERLIPIDEMIA 02/03/2007   HYPERTENSION 02/03/2007   IBS (irritable bowel syndrome)    Impaired glucose tolerance 02/11/2011   nscl ca 03/30/2020   Smoker 08/22/2014   [place]   sober for 16 yrs    ALLERGIES:  has No Known Allergies.  MEDICATIONS:  Current Outpatient Medications  Medication Sig Dispense Refill   amLODipine-benazepril (LOTREL) 10-20 MG capsule Take 1 capsule by mouth once daily 90 capsule 3   aspirin 325 MG EC tablet Take 1 tablet (325 mg total) by mouth daily. 90 tablet 99   azithromycin (ZITHROMAX Z-PAK) 250 MG tablet Use as directed 6 each 0   b complex vitamins tablet Take 1 tablet by mouth daily.     cholecalciferol (VITAMIN D3) 25 MCG (1000 UNIT) tablet Take 1,000 Units by mouth daily.     folic acid (FOLVITE) 1 MG tablet Take 1 tablet by mouth once daily 90 tablet 3   Garlic 1123XX123MG CAPS Take 1,000 mg by mouth daily.      lidocaine-prilocaine (EMLA) cream Apply to the Port-A-Cath site 30-60 minutes before chemotherapy. 30 g 0   Omega-3 Fatty Acids (FISH OIL PO) Take 1 capsule by mouth daily.      polyethylene glycol (MIRALAX / GLYCOLAX) 17 g packet Take 17 g by mouth daily as needed.  prochlorperazine (COMPAZINE) 10 MG tablet Take 1 tablet (10 mg total) by mouth every 6 (six) hours as needed for nausea or vomiting. 30 tablet 0   rosuvastatin (CRESTOR) 40 MG tablet Take 1 tablet (40 mg total) by mouth daily. 90 tablet 3   vardenafil (LEVITRA) 20 MG tablet TAKE 1 TABLET BY MOUTH AS NEEDED FOR  ERECTILE  DYSFUNCTION 10 tablet 5   No current facility-administered medications for this visit.    SURGICAL HISTORY:  Past Surgical History:  Procedure Laterality Date   BRONCHIAL BIOPSY  03/30/2020   Procedure: BRONCHIAL BIOPSIES;   Surgeon: Garner Nash, DO;  Location: [facility];  Service: Pulmonary;;   BRONCHIAL BRUSHINGS  03/30/2020   Procedure: BRONCHIAL BRUSHINGS;  Surgeon: Garner Nash, DO;  Location: [facility];  Service: Pulmonary;;   BRONCHIAL NEEDLE ASPIRATION BIOPSY  03/30/2020   Procedure: BRONCHIAL NEEDLE ASPIRATION BIOPSIES;  Surgeon: Garner Nash, DO;  Location: [facility];  Service: Pulmonary;;   BRONCHIAL WASHINGS  03/30/2020   Procedure: BRONCHIAL WASHINGS;  Surgeon: Garner Nash, DO;  Location: [facility];  Service: Pulmonary;;   COLONOSCOPY  2007   IR IMAGING GUIDED PORT INSERTION  04/23/2020   neck fusion  2012   C4   NO PAST SURGERIES     VIDEO BRONCHOSCOPY WITH ENDOBRONCHIAL NAVIGATION N/A 03/30/2020   Procedure: VIDEO BRONCHOSCOPY WITH ENDOBRONCHIAL NAVIGATION;  Surgeon: Garner Nash, DO;  Location: [facility];  Service: Pulmonary;  Laterality: N/A;   VIDEO BRONCHOSCOPY WITH ENDOBRONCHIAL ULTRASOUND N/A 03/30/2020   Procedure: VIDEO BRONCHOSCOPY WITH ENDOBRONCHIAL ULTRASOUND;  Surgeon: Garner Nash, DO;  Location: [facility];  Service: Pulmonary;  Laterality: N/A;    REVIEW OF SYSTEMS:   Review of Systems  Constitutional: Negative for appetite change, chills, fatigue, fever and unexpected weight change.  HENT:   Negative for mouth sores, nosebleeds, sore throat and trouble swallowing.   Eyes: Negative for eye problems and icterus.  Respiratory: Negative for cough, hemoptysis, shortness of breath and wheezing.   Cardiovascular: Negative for chest pain and leg swelling.  Gastrointestinal: Negative for abdominal pain, constipation, diarrhea, nausea and vomiting.  Genitourinary: Negative for bladder incontinence, difficulty urinating, dysuria, frequency and hematuria.   Musculoskeletal: Negative for back pain, gait problem, neck pain and neck stiffness.  Skin: Negative for itching and rash.  Neurological: Negative for dizziness, extremity weakness, gait  problem, headaches, light-headedness and seizures.  Hematological: Negative for adenopathy. Does not bruise/bleed easily.  Psychiatric/Behavioral: Negative for confusion, depression and sleep disturbance. The patient is not nervous/anxious.     PHYSICAL EXAMINATION:  Blood pressure 114/68, pulse 90, temperature 97.8 F (36.6 C), temperature source Temporal, resp. rate 17, height 6' (1.829 m), weight 174 lb 3.2 oz (79 kg), SpO2 99 %.  ECOG PERFORMANCE STATUS: 1  Physical Exam  Constitutional: Oriented to person, place, and time and well-developed, well-nourished, and in no distress.  HENT:  Head: Normocephalic and atraumatic.  Mouth/Throat: Oropharynx is clear and moist. No oropharyngeal exudate.  Eyes: Conjunctivae are normal. Right eye exhibits no discharge. Left eye exhibits no discharge. No scleral icterus.  Neck: Normal range of motion. Neck supple.  Cardiovascular: Normal rate, regular rhythm, normal heart sounds and intact distal pulses.   Pulmonary/Chest: Effort normal and breath sounds normal. No respiratory distress. No wheezes. No rales.  Abdominal: Soft. Bowel sounds are normal. Exhibits no distension and no mass. There is no tenderness.  Musculoskeletal: Normal range of motion. Exhibits no edema.  Lymphadenopathy:    No cervical  adenopathy.  Neurological: Alert and oriented to person, place, and time. Exhibits normal muscle tone. Gait normal. Coordination normal.  Skin: Skin is warm and dry. No rash noted. Not diaphoretic. No erythema. No pallor.  Psychiatric: Mood, memory and judgment normal.  Vitals reviewed.  LABORATORY DATA: Lab Results  Component Value Date   WBC 3.8 (L) 08/12/2022   HGB 10.7 (L) 08/12/2022   HCT 31.4 (L) 08/12/2022   MCV 85.1 08/12/2022   PLT 208 08/12/2022      Chemistry      Component Value Date/Time   NA 138 07/22/2022 0954   K 3.5 07/22/2022 0954   CL 106 07/22/2022 0954   CO2 23 07/22/2022 0954   BUN 10 07/22/2022 0954    CREATININE 1.24 07/22/2022 0954   CREATININE 0.88 02/21/2020 1115      Component Value Date/Time   CALCIUM 8.4 (L) 07/22/2022 0954   ALKPHOS 50 07/22/2022 0954   AST 30 07/22/2022 0954   ALT 19 07/22/2022 0954   BILITOT 0.2 (L) 07/22/2022 0954       RADIOGRAPHIC STUDIES:  CT Chest W Contrast  Result Date: 07/18/2022 CLINICAL DATA:  Non-small-cell lung cancer. Restaging. * Tracking Code: BO * EXAM: CT CHEST, ABDOMEN, AND PELVIS WITH CONTRAST TECHNIQUE: Multidetector CT imaging of the chest, abdomen and pelvis was performed following the standard protocol during bolus administration of intravenous contrast. RADIATION DOSE REDUCTION: This exam was performed according to the departmental dose-optimization program which includes automated exposure control, adjustment of the mA and/or kV according to patient size and/or use of iterative reconstruction technique. CONTRAST:  131m OMNIPAQUE IOHEXOL 300 MG/ML  SOLN COMPARISON:  04/25/2022 FINDINGS: CT CHEST FINDINGS Cardiovascular: The heart size is normal. No substantial pericardial effusion. Mild atherosclerotic calcification is noted in the wall of the thoracic aorta. Right Port-A-Cath tip is positioned at the SVC/RA junction. Mediastinum/Nodes: No mediastinal lymphadenopathy. There is no hilar lymphadenopathy. The esophagus has normal imaging features. There is no axillary lymphadenopathy. Lungs/Pleura: Centrilobular and paraseptal emphysema evident. Similar bilateral ill-defined irregular nodular lesions in both lungs. Measurements did not persist on the previous exam and have been remeasured today for comparison purposes. Index posterior right upper lobe nodule on 40/6 is 10 mm today compared to 11 mm previously. Index right retro hilar lesion measuring 2 cm today on 74/6 was 2.2 cm previously. Index mixed attenuation lesion right lower lobe on 97/6 is 2.5 cm today, unchanged in the interval. Index nodule anterior right middle lobe is stable at 4 mm.  Mixed attenuation lesion left lower lobe measures 3.3 x 2.8 cm on image 117/6 today which compares to 3.5 x 2.9 cm previously. Moderate right and small left pleural effusions are progressive in the interval. Musculoskeletal: No worrisome lytic or sclerotic osseous abnormality. CT ABDOMEN PELVIS FINDINGS Hepatobiliary: No suspicious focal abnormality within the liver parenchyma. The tiny hypodensity seen previously in segment II of the liver is not visible on today's exam. There is no evidence for gallstones, gallbladder wall thickening, or pericholecystic fluid. No intrahepatic or extrahepatic biliary dilation. Pancreas: No focal mass lesion. No dilatation of the main duct. No intraparenchymal cyst. No peripancreatic edema. Spleen: No splenomegaly. No focal mass lesion. Adrenals/Urinary Tract: No adrenal nodule or mass. Cortical scarring noted both kidneys without hydronephrosis. No evidence for hydroureter. Mild bladder wall thickening likely accentuated by underdistention. Stomach/Bowel: Stomach is unremarkable. No gastric wall thickening. No evidence of outlet obstruction. Duodenum is normally positioned as is the ligament of Treitz. No small bowel wall thickening.  No small bowel dilatation. The terminal ileum is normal. The appendix is normal. No gross colonic mass. No colonic wall thickening. Vascular/Lymphatic: There is mild atherosclerotic calcification of the abdominal aorta without aneurysm. There is no gastrohepatic or hepatoduodenal ligament lymphadenopathy. No retroperitoneal or mesenteric lymphadenopathy. No pelvic sidewall lymphadenopathy. Reproductive: Unremarkable. Other: Subtle presacral edema is stable. Trace free fluid noted in the pelvis. Musculoskeletal: No worrisome lytic or sclerotic osseous abnormality. Small sclerotic focus in the left sacrum is stable, likely a bone island. IMPRESSION: 1. Multiple bilateral pulmonary lesions again noted, showing no substantial interval change. No new  pulmonary lesion evident on today's study. 2. Moderate right and small left pleural effusions are progressive in the interval. 3. No evidence for metastatic disease in the abdomen or pelvis. 4. Aortic Atherosclerosis (ICD10-I70.0) and Emphysema (ICD10-J43.9). Electronically Signed   By: Misty Stanley M.D.   On: 07/18/2022 09:15   CT Abdomen Pelvis W Contrast  Result Date: 07/18/2022 CLINICAL DATA:  Non-small-cell lung cancer. Restaging. * Tracking Code: BO * EXAM: CT CHEST, ABDOMEN, AND PELVIS WITH CONTRAST TECHNIQUE: Multidetector CT imaging of the chest, abdomen and pelvis was performed following the standard protocol during bolus administration of intravenous contrast. RADIATION DOSE REDUCTION: This exam was performed according to the departmental dose-optimization program which includes automated exposure control, adjustment of the mA and/or kV according to patient size and/or use of iterative reconstruction technique. CONTRAST:  171m OMNIPAQUE IOHEXOL 300 MG/ML  SOLN COMPARISON:  04/25/2022 FINDINGS: CT CHEST FINDINGS Cardiovascular: The heart size is normal. No substantial pericardial effusion. Mild atherosclerotic calcification is noted in the wall of the thoracic aorta. Right Port-A-Cath tip is positioned at the SVC/RA junction. Mediastinum/Nodes: No mediastinal lymphadenopathy. There is no hilar lymphadenopathy. The esophagus has normal imaging features. There is no axillary lymphadenopathy. Lungs/Pleura: Centrilobular and paraseptal emphysema evident. Similar bilateral ill-defined irregular nodular lesions in both lungs. Measurements did not persist on the previous exam and have been remeasured today for comparison purposes. Index posterior right upper lobe nodule on 40/6 is 10 mm today compared to 11 mm previously. Index right retro hilar lesion measuring 2 cm today on 74/6 was 2.2 cm previously. Index mixed attenuation lesion right lower lobe on 97/6 is 2.5 cm today, unchanged in the interval. Index  nodule anterior right middle lobe is stable at 4 mm. Mixed attenuation lesion left lower lobe measures 3.3 x 2.8 cm on image 117/6 today which compares to 3.5 x 2.9 cm previously. Moderate right and small left pleural effusions are progressive in the interval. Musculoskeletal: No worrisome lytic or sclerotic osseous abnormality. CT ABDOMEN PELVIS FINDINGS Hepatobiliary: No suspicious focal abnormality within the liver parenchyma. The tiny hypodensity seen previously in segment II of the liver is not visible on today's exam. There is no evidence for gallstones, gallbladder wall thickening, or pericholecystic fluid. No intrahepatic or extrahepatic biliary dilation. Pancreas: No focal mass lesion. No dilatation of the main duct. No intraparenchymal cyst. No peripancreatic edema. Spleen: No splenomegaly. No focal mass lesion. Adrenals/Urinary Tract: No adrenal nodule or mass. Cortical scarring noted both kidneys without hydronephrosis. No evidence for hydroureter. Mild bladder wall thickening likely accentuated by underdistention. Stomach/Bowel: Stomach is unremarkable. No gastric wall thickening. No evidence of outlet obstruction. Duodenum is normally positioned as is the ligament of Treitz. No small bowel wall thickening. No small bowel dilatation. The terminal ileum is normal. The appendix is normal. No gross colonic mass. No colonic wall thickening. Vascular/Lymphatic: There is mild atherosclerotic calcification of the abdominal aorta without  aneurysm. There is no gastrohepatic or hepatoduodenal ligament lymphadenopathy. No retroperitoneal or mesenteric lymphadenopathy. No pelvic sidewall lymphadenopathy. Reproductive: Unremarkable. Other: Subtle presacral edema is stable. Trace free fluid noted in the pelvis. Musculoskeletal: No worrisome lytic or sclerotic osseous abnormality. Small sclerotic focus in the left sacrum is stable, likely a bone island. IMPRESSION: 1. Multiple bilateral pulmonary lesions again noted,  showing no substantial interval change. No new pulmonary lesion evident on today's study. 2. Moderate right and small left pleural effusions are progressive in the interval. 3. No evidence for metastatic disease in the abdomen or pelvis. 4. Aortic Atherosclerosis (ICD10-I70.0) and Emphysema (ICD10-J43.9). Electronically Signed   By: Misty Stanley M.D.   On: 07/18/2022 09:15     ASSESSMENT/PLAN:  This is a very pleasant 67 year old African-American male diagnosed with stage IV (T3, N2, M1C) non-small cell lung cancer, adenocarcinoma.  He presented with a right upper lobe lung mass in addition to a right lower lobe, left lower lobe lung lesions with right hilar and mediastinal lymphadenopathy.  He also had metastatic disease to the brain.  He was diagnosed in October 2022.  His molecular studies by guardant 360 did not show any actionable mutations.    The patient underwent SRS treatment to the metastatic brain lesions under the care of Dr. Lisbeth Renshaw.  This was completed on 04/20/2021.   He completed re-treatment SRS to the progressive brain lesion in the cerebellum under the care of Dr. Lisbeth Renshaw on 02/12/22.    The patient is currently undergoing systemic chemotherapy/immunotherapy.  He started with carboplatin for an AUC of 5, Alimta 500 mg per metered squared, Keytruda 200 mg IV every 3 weeks.  He is status post 40 cycles.  Starting from cycle #5, the patient has been on maintenance treatment with Alimta and Keytruda IV every 3 weeks. Starting from cycle #22, the patient has been on single agent alimta. [name] has been on hold due to CT findings of suspicious immunotherapy mediated enteritis. [name] was resumed with cycle #24 and he has been tolerating it well  Labs were reviewed.  His total WBC is 3.8 and ANC is 1.6.   We will see the patient back for follow-up visit in 3 weeks for evaluation repeat blood work before undergoing cycle #42.  At a prior appointment, he did have a pleural effusion that he  was asymptomatic from.  We previously reviewed signs and symptoms of worsening effusion that would warrant thoracentesis.  The patient denies any breathing changes at this time.  The patient was advised to call immediately if he has any concerning symptoms in the interval. The patient voices understanding of current disease status and treatment options and is in agreement with the current care plan. All questions were answered. The patient knows to call the clinic with any problems, questions or concerns. We can certainly see the patient much sooner if necessary   Orders Placed This Encounter  Procedures   CBC with Differential ([facility] Only)    Standing Status:   Future    Standing Expiration Date:   08/13/2023   CMP ([facility] only)    Standing Status:   Future    Standing Expiration Date:   08/13/2023   CBC with Differential ([facility] Only)    Standing Status:   Future    Standing Expiration Date:   09/03/2023   CMP ([facility] only)    Standing Status:   Future    Standing Expiration Date:   09/03/2023   CBC with  Differential ([facility] Only)    Standing Status:   Future    Standing Expiration Date:   09/24/2023   CMP ([facility] only)    Standing Status:   Future    Standing Expiration Date:   09/24/2023   CBC with Differential ([facility] Only)    Standing Status:   Future    Standing Expiration Date:   10/15/2023   CMP ([facility] only)    Standing Status:   Future    Standing Expiration Date:   10/15/2023   CBC with Differential ([facility] Only)    Standing Status:   Future    Standing Expiration Date:   11/05/2023   CMP ([facility] only)    Standing Status:   Future    Standing Expiration Date:   11/05/2023     The total time spent in the appointment was 20-29 minutes  Vylette Strubel L Avalyn Molino, PA-C 08/12/22

## 2022-08-08 ENCOUNTER — Other Ambulatory Visit: Payer: Self-pay | Admitting: Physician Assistant

## 2022-08-08 DIAGNOSIS — C3491 Malignant neoplasm of unspecified part of right bronchus or lung: Secondary | ICD-10-CM

## 2022-08-12 ENCOUNTER — Inpatient Hospital Stay: Payer: Medicare Other

## 2022-08-12 ENCOUNTER — Other Ambulatory Visit: Payer: Self-pay

## 2022-08-12 ENCOUNTER — Inpatient Hospital Stay (HOSPITAL_BASED_OUTPATIENT_CLINIC_OR_DEPARTMENT_OTHER): Payer: Medicare Other | Admitting: Physician Assistant

## 2022-08-12 VITALS — BP 114/68 | HR 90 | Temp 97.8°F | Resp 17 | Ht 72.0 in | Wt 174.2 lb

## 2022-08-12 DIAGNOSIS — C7931 Secondary malignant neoplasm of brain: Secondary | ICD-10-CM | POA: Diagnosis not present

## 2022-08-12 DIAGNOSIS — Z5112 Encounter for antineoplastic immunotherapy: Secondary | ICD-10-CM | POA: Diagnosis not present

## 2022-08-12 DIAGNOSIS — Z79899 Other long term (current) drug therapy: Secondary | ICD-10-CM | POA: Diagnosis not present

## 2022-08-12 DIAGNOSIS — C3491 Malignant neoplasm of unspecified part of right bronchus or lung: Secondary | ICD-10-CM

## 2022-08-12 DIAGNOSIS — C3411 Malignant neoplasm of upper lobe, right bronchus or lung: Secondary | ICD-10-CM | POA: Diagnosis not present

## 2022-08-12 DIAGNOSIS — Z5111 Encounter for antineoplastic chemotherapy: Secondary | ICD-10-CM | POA: Diagnosis not present

## 2022-08-12 DIAGNOSIS — Z87891 Personal history of nicotine dependence: Secondary | ICD-10-CM | POA: Diagnosis not present

## 2022-08-12 DIAGNOSIS — Z95828 Presence of other vascular implants and grafts: Secondary | ICD-10-CM

## 2022-08-12 LAB — CBC WITH DIFFERENTIAL (CANCER CENTER ONLY)
Abs Immature Granulocytes: 0.01 10*3/uL (ref 0.00–0.07)
Basophils Absolute: 0 10*3/uL (ref 0.0–0.1)
Basophils Relative: 0 %
Eosinophils Absolute: 0.2 10*3/uL (ref 0.0–0.5)
Eosinophils Relative: 5 %
HCT: 31.4 % — ABNORMAL LOW (ref 39.0–52.0)
Hemoglobin: 10.7 g/dL — ABNORMAL LOW (ref 13.0–17.0)
Immature Granulocytes: 0 %
Lymphocytes Relative: 39 %
Lymphs Abs: 1.5 10*3/uL (ref 0.7–4.0)
MCH: 29 pg (ref 26.0–34.0)
MCHC: 34.1 g/dL (ref 30.0–36.0)
MCV: 85.1 fL (ref 80.0–100.0)
Monocytes Absolute: 0.6 10*3/uL (ref 0.1–1.0)
Monocytes Relative: 15 %
Neutro Abs: 1.6 10*3/uL — ABNORMAL LOW (ref 1.7–7.7)
Neutrophils Relative %: 41 %
Platelet Count: 208 10*3/uL (ref 150–400)
RBC: 3.69 MIL/uL — ABNORMAL LOW (ref 4.22–5.81)
RDW: 17.2 % — ABNORMAL HIGH (ref 11.5–15.5)
WBC Count: 3.8 10*3/uL — ABNORMAL LOW (ref 4.0–10.5)
nRBC: 0 % (ref 0.0–0.2)

## 2022-08-12 LAB — CMP (CANCER CENTER ONLY)
ALT: 15 U/L (ref 0–44)
AST: 23 U/L (ref 15–41)
Albumin: 3.2 g/dL — ABNORMAL LOW (ref 3.5–5.0)
Alkaline Phosphatase: 50 U/L (ref 38–126)
Anion gap: 7 (ref 5–15)
BUN: 16 mg/dL (ref 8–23)
CO2: 25 mmol/L (ref 22–32)
Calcium: 8.4 mg/dL — ABNORMAL LOW (ref 8.9–10.3)
Chloride: 106 mmol/L (ref 98–111)
Creatinine: 1.34 mg/dL — ABNORMAL HIGH (ref 0.61–1.24)
GFR, Estimated: 59 mL/min — ABNORMAL LOW (ref >60)
Glucose, Bld: 117 mg/dL — ABNORMAL HIGH (ref 70–99)
Potassium: 3.6 mmol/L (ref 3.5–5.1)
Sodium: 138 mmol/L (ref 135–145)
Total Bilirubin: 0.2 mg/dL — ABNORMAL LOW (ref 0.3–1.2)
Total Protein: 6.8 g/dL (ref 6.5–8.1)

## 2022-08-12 MED ORDER — SODIUM CHLORIDE 0.9% FLUSH
10.0000 mL | Freq: Once | INTRAVENOUS | Status: AC
  Administered 2022-08-12: 10 mL

## 2022-08-12 MED ORDER — SODIUM CHLORIDE 0.9 % IV SOLN
200.0000 mg | Freq: Once | INTRAVENOUS | Status: AC
  Administered 2022-08-12: 200 mg via INTRAVENOUS
  Filled 2022-08-12: qty 8

## 2022-08-12 MED ORDER — HEPARIN SOD (PORK) LOCK FLUSH 100 UNIT/ML IV SOLN
500.0000 [IU] | Freq: Once | INTRAVENOUS | Status: AC | PRN
  Administered 2022-08-12: 500 [IU]

## 2022-08-12 MED ORDER — SODIUM CHLORIDE 0.9 % IV SOLN
500.0000 mg/m2 | Freq: Once | INTRAVENOUS | Status: AC
  Administered 2022-08-12: 1000 mg via INTRAVENOUS
  Filled 2022-08-12: qty 40

## 2022-08-12 MED ORDER — SODIUM CHLORIDE 0.9% FLUSH
10.0000 mL | INTRAVENOUS | Status: DC | PRN
  Administered 2022-08-12: 10 mL

## 2022-08-12 MED ORDER — SODIUM CHLORIDE 0.9 % IV SOLN: Freq: Once | INTRAVENOUS | Status: AC

## 2022-08-12 MED ORDER — PROCHLORPERAZINE MALEATE 10 MG PO TABS
10.0000 mg | ORAL_TABLET | Freq: Once | ORAL | Status: AC
  Administered 2022-08-12: 10 mg via ORAL
  Filled 2022-08-12: qty 1

## 2022-08-12 NOTE — Patient Instructions (Signed)
Section  Discharge Instructions: Thank you for choosing [facility] to provide your oncology and hematology care.   If you have a lab appointment with the [facility], please go directly to the [facility] and check in at the registration area.   Wear comfortable clothing and clothing appropriate for easy access to any Portacath or PICC line.   We strive to give you quality time with your provider. You may need to reschedule your appointment if you arrive late (15 or more minutes).  Arriving late affects you and other patients whose appointments are after yours.  Also, if you miss three or more appointments without notifying the office, you may be dismissed from the clinic at the provider's discretion.      For prescription refill requests, have your pharmacy contact our office and allow 72 hours for refills to be completed.    Today you received the following chemotherapy and/or immunotherapy agents: Keytruda/Alimta      To help prevent nausea and vomiting after your treatment, we encourage you to take your nausea medication as directed.  BELOW ARE SYMPTOMS THAT SHOULD BE REPORTED IMMEDIATELY: *FEVER GREATER THAN 100.4 F (38 C) OR HIGHER *CHILLS OR SWEATING *NAUSEA AND VOMITING THAT IS NOT CONTROLLED WITH YOUR NAUSEA MEDICATION *UNUSUAL SHORTNESS OF BREATH *UNUSUAL BRUISING OR BLEEDING *URINARY PROBLEMS (pain or burning when urinating, or frequent urination) *BOWEL PROBLEMS (unusual diarrhea, constipation, pain near the anus) TENDERNESS IN MOUTH AND THROAT WITH OR WITHOUT PRESENCE OF ULCERS (sore throat, sores in mouth, or a toothache) UNUSUAL RASH, SWELLING OR PAIN  UNUSUAL VAGINAL DISCHARGE OR ITCHING   Items with * indicate a potential emergency and should be followed up as soon as possible or go to the Emergency Department if any problems should occur.  Please show the CHEMOTHERAPY ALERT CARD or IMMUNOTHERAPY ALERT CARD at  check-in to the Emergency Department and triage nurse.  Should you have questions after your visit or need to cancel or reschedule your appointment, please contact [facility]  Dept: [phone]  and follow the prompts.  Office hours are 8:00 a.m. to 4:30 p.m. Monday - Friday. Please note that voicemails left after 4:00 p.m. may not be returned until the following business day.  We are closed weekends and major holidays. You have access to a nurse at all times for urgent questions. Please call the main number to the clinic Dept: [phone] and follow the prompts.   For any non-urgent questions, you may also contact your provider using MyChart. We now offer e-Visits for anyone 69 and older to request care online for non-urgent symptoms. For details visit [email or web address].   Also download the MyChart app! Go to the app store, search "MyChart", open the app, select [facility], and log in with your MyChart username and password.

## 2022-08-12 NOTE — Progress Notes (Signed)
Patient seen by PA today  Vitals are within treatment parameters.  Labs reviewed: and are within treatment parameters.  Per physician team, patient is ready for treatment and there are NO modifications to the treatment plan.

## 2022-08-14 ENCOUNTER — Other Ambulatory Visit: Payer: Self-pay | Admitting: Radiation Therapy

## 2022-08-14 ENCOUNTER — Ambulatory Visit
Admission: RE | Admit: 2022-08-14 | Discharge: 2022-08-14 | Disposition: A | Discharge status: Home / Self Care | Payer: Medicare Other | Source: Ambulatory Visit | Attending: Radiation Oncology | Admitting: Radiation Oncology

## 2022-08-14 DIAGNOSIS — C7931 Secondary malignant neoplasm of brain: Secondary | ICD-10-CM

## 2022-08-14 DIAGNOSIS — C729 Malignant neoplasm of central nervous system, unspecified: Secondary | ICD-10-CM | POA: Diagnosis not present

## 2022-08-14 MED ORDER — SODIUM CHLORIDE 0.9% FLUSH
10.0000 mL | INTRAVENOUS | Status: DC | PRN
  Administered 2022-08-14: 10 mL via INTRAVENOUS

## 2022-08-14 MED ORDER — HEPARIN SOD (PORK) LOCK FLUSH 100 UNIT/ML IV SOLN
500.0000 [IU] | Freq: Once | INTRAVENOUS | Status: AC
  Administered 2022-08-14: 500 [IU] via INTRAVENOUS

## 2022-08-14 MED ORDER — GADOPICLENOL 0.5 MMOL/ML IV SOLN
8.0000 mL | Freq: Once | INTRAVENOUS | Status: AC | PRN
  Administered 2022-08-14: 8 mL via INTRAVENOUS

## 2022-08-18 ENCOUNTER — Ambulatory Visit
Admission: RE | Admit: 2022-08-18 | Discharge: 2022-08-18 | Disposition: A | Discharge status: Home / Self Care | Payer: Medicare Other | Source: Ambulatory Visit | Attending: Radiation Oncology | Admitting: Radiation Oncology

## 2022-08-18 ENCOUNTER — Inpatient Hospital Stay: Payer: Medicare Other | Attending: Internal Medicine

## 2022-08-18 ENCOUNTER — Encounter: Payer: Self-pay | Admitting: Radiation Oncology

## 2022-08-18 DIAGNOSIS — Z87891 Personal history of nicotine dependence: Secondary | ICD-10-CM | POA: Diagnosis not present

## 2022-08-18 DIAGNOSIS — Z5112 Encounter for antineoplastic immunotherapy: Secondary | ICD-10-CM | POA: Insufficient documentation

## 2022-08-18 DIAGNOSIS — C7931 Secondary malignant neoplasm of brain: Secondary | ICD-10-CM

## 2022-08-18 DIAGNOSIS — C3432 Malignant neoplasm of lower lobe, left bronchus or lung: Secondary | ICD-10-CM | POA: Insufficient documentation

## 2022-08-18 DIAGNOSIS — C3481 Malignant neoplasm of overlapping sites of right bronchus and lung: Secondary | ICD-10-CM | POA: Diagnosis not present

## 2022-08-18 DIAGNOSIS — C3491 Malignant neoplasm of unspecified part of right bronchus or lung: Secondary | ICD-10-CM

## 2022-08-18 DIAGNOSIS — C3411 Malignant neoplasm of upper lobe, right bronchus or lung: Secondary | ICD-10-CM | POA: Insufficient documentation

## 2022-08-18 DIAGNOSIS — Z5111 Encounter for antineoplastic chemotherapy: Secondary | ICD-10-CM | POA: Insufficient documentation

## 2022-08-18 DIAGNOSIS — C3482 Malignant neoplasm of overlapping sites of left bronchus and lung: Secondary | ICD-10-CM | POA: Diagnosis not present

## 2022-08-18 NOTE — Progress Notes (Signed)
Telephone nursing appointment for patient to review most recent scan from 08/15/22. I verified patient's identity and began nursing interview. Patient reports doing well.   Meaningful use complete.   Patient aware of their 3:00pm-08/18/22 telephone appointment w/ Shona Simpson PA-C. I left my extension [phone] in case patient needs anything. Patient verbalized understanding. This concludes the nursing interview.   Patient contact [phone]     Leandra Kern, LPN

## 2022-08-18 NOTE — Progress Notes (Signed)
Radiation Oncology         ([phone]) [phone] ________________________________  Outpatient Follow Up - Conducted via telephone at patient request.  I spoke with the patient to conduct this visit via telephone. The patient was notified in advance and was offered an in person or telemedicine meeting to allow for face to face communication but instead preferred to proceed with a telephone visit.    Name: Nicolas Allen        MRN: [id number]  Date of Service: 08/18/2022 DOB: 1956/02/22  [id number], Hunt Oris, MD  Nicolas Borg, MD     REFERRING PHYSICIAN: [name], MD   DIAGNOSIS: The primary encounter diagnosis was Malignant neoplasm metastatic to brain [facility]). A diagnosis of Non-small cell carcinoma of right lung, stage 4 (HCC) was also pertinent to this visit.   HISTORY OF PRESENT ILLNESS: Nicolas Allen is a 67 y.o. male with a history of stage IV non-small cell lung cancer, adenocarcinoma involving multifocal disease in the lung and 3 brain metastases. He was diagnosed in the fall of 2021 and found to have metastatic disease in the lung and brain for which he has received stereotactic radiosurgery [facility]) with Dr. Lisbeth Renshaw and has continued on chemo/immunotherapy with Dr. Julien Allen.    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 He has been followed since and tolerated therapy well. He remains on immunotherapy consolidation with Dr. Julien Allen.  Today in discussion and brain oncology conference it is still favored that the lesion in the vermus is more prominent from  pseudoprogression due to immunotherapy but the lesion measures up to 18 mm with adjacent edema remaining minor.  No other nodules or lesions were noted to be present or of concern.  He has a stable right parotid mass  measuring 2.3 cm.  Discussion in conference was to continue close follow-up and surveillance of this but to have a low threshold if he became symptomatic from using steroids. He's contacted today to review these results.    PREVIOUS RADIATION THERAPY:  02/12/2022 through 02/12/2022 [facility] Treatment Site Technique Total Dose (Gy) Dose per Fx (Gy) Completed Fx Beam Energies  Brain:  PTV_2_Retreat_80m Mid Cerebellum IMRT 20/20 20 1/1 6XFFF    04/20/20 SRS Treatment: Each site below was treated to 20 Gy in 1 fraction PTV1 Rt Cerebellum 49mPTV2Mid cerebellum 1136mTV3Lt Parietal 6mm81m PAST MEDICAL HISTORY:  Past Medical History:  Diagnosis Date   Anemia 06/24/2012   ANXIETY 02/03/2007   Cervical disc disease 02/12/2012   S/p surgury jan 2013   Erectile dysfunction 02/11/2011   GERD (gastroesophageal reflux disease)    GLUCOSE INTOLERANCE 01/04/2008   HYPERLIPIDEMIA 02/03/2007   HYPERTENSION 02/03/2007   IBS (irritable bowel syndrome)    Impaired glucose tolerance 02/11/2011   nscl ca 03/30/2020   Smoker 08/22/2014   Substance abuse (HCC)[place]   sober for 16 yrs       PAST SURGICAL HISTORY: Past Surgical History:  Procedure Laterality Date   BRONCHIAL BIOPSY  03/30/2020   Procedure: BRONCHIAL BIOPSIES;  Surgeon: IcarGarner Nash;  [facility];  Service: Pulmonary;;   BRONCHIAL BRUSHINGS  03/30/2020   Procedure: BRONCHIAL BRUSHINGS;  Surgeon: IcarGarner Nash;  [facility];  Service: Pulmonary;;   BRONCHIAL  NEEDLE ASPIRATION BIOPSY  03/30/2020   Procedure: BRONCHIAL NEEDLE ASPIRATION BIOPSIES;  Surgeon: Garner Nash, DO;  Location: [place] [facility];  Service: Pulmonary;;   BRONCHIAL WASHINGS  03/30/2020   Procedure: BRONCHIAL WASHINGS;  Surgeon: Garner Nash, DO;  Location: [place] [facility];  Service: Pulmonary;;   COLONOSCOPY  2007   IR IMAGING GUIDED PORT INSERTION  04/23/2020   neck fusion  2012   C4   NO PAST SURGERIES     VIDEO  BRONCHOSCOPY WITH ENDOBRONCHIAL NAVIGATION N/A 03/30/2020   Procedure: VIDEO BRONCHOSCOPY WITH ENDOBRONCHIAL NAVIGATION;  Surgeon: Garner Nash, DO;  Location: [place];  Service: Pulmonary;  Laterality: N/A;   VIDEO BRONCHOSCOPY WITH ENDOBRONCHIAL ULTRASOUND N/A 03/30/2020   Procedure: VIDEO BRONCHOSCOPY WITH ENDOBRONCHIAL ULTRASOUND;  Surgeon: Garner Nash, DO;  Location: [place];  Service: Pulmonary;  Laterality: N/A;     FAMILY HISTORY:  Family History  Problem Relation Age of Onset   Cancer Mother        esophagus   Cancer Cousin    Stroke Other    Hypertension Other    Colon cancer Neg Hx      SOCIAL HISTORY:  reports that he quit smoking about 2 years ago. His smoking use included cigarettes. He has a 45.00 pack-year smoking history. He has quit using smokeless tobacco.  His smokeless tobacco use included chew. He reports that he does not drink alcohol and does not use drugs. The patient is married and resides in [place]. He enjoys fishing and throwing horseshoes and competes in local tournaments between April and October.    ALLERGIES: Patient has no known allergies.   MEDICATIONS:  Current Outpatient Medications  Medication Sig Dispense Refill   amLODipine-benazepril (LOTREL) 10-20 MG capsule Take 1 capsule by mouth once daily 90 capsule 3   aspirin 325 MG EC tablet Take 1 tablet (325 mg total) by mouth daily. 90 tablet 99   azithromycin (ZITHROMAX Z-PAK) 250 MG tablet Use as directed 6 each 0   b complex vitamins tablet Take 1 tablet by mouth daily.     cholecalciferol (VITAMIN D3) 25 MCG (1000 UNIT) tablet Take 1,000 Units by mouth daily.     folic acid (FOLVITE) 1 MG tablet Take 1 tablet by mouth once daily 90 tablet 3   Garlic 123XX123 MG CAPS Take 1,000 mg by mouth daily.      lidocaine-prilocaine (EMLA) cream Apply to the Port-A-Cath site 30-60 minutes before chemotherapy. 30 g 0   Omega-3 Fatty Acids (FISH OIL PO) Take 1 capsule by mouth daily.       polyethylene glycol (MIRALAX / GLYCOLAX) 17 g packet Take 17 g by mouth daily as needed.     prochlorperazine (COMPAZINE) 10 MG tablet Take 1 tablet (10 mg total) by mouth every 6 (six) hours as needed for nausea or vomiting. 30 tablet 0   rosuvastatin (CRESTOR) 40 MG tablet Take 1 tablet (40 mg total) by mouth daily. 90 tablet 3   vardenafil (LEVITRA) 20 MG tablet TAKE 1 TABLET BY MOUTH AS NEEDED FOR  ERECTILE  DYSFUNCTION 10 tablet 5   No current facility-administered medications for this encounter.     REVIEW OF SYSTEMS: On review of systems, the patient reports he is doing great. He is tired and attributes this to systemic therapy. Otherwise he feels like he is doing well overall. He denies headaches, dizziness, nausea, vomiting, or changes in movement. He denies any shortness of breath or chest pain. No other complaints  are verbalized.   PHYSICAL EXAM:  Unable to assess given encounter type.    ECOG = 0  0 - Asymptomatic (Fully active, able to carry on all predisease activities without restriction)  1 - Symptomatic but completely ambulatory (Restricted in physically strenuous activity but ambulatory and able to carry out work of a light or sedentary nature. For example, light housework, office work)  2 - Symptomatic, <50% in bed during the day (Ambulatory and capable of all self care but unable to carry out any work activities. Up and about more than 50% of waking hours)  3 - Symptomatic, >50% in bed, but not bedbound (Capable of only limited self-care, confined to bed or chair 50% or more of waking hours)  4 - Bedbound (Completely disabled. Cannot carry on any self-care. Totally confined to bed or chair)  5 - Death   Nicolas Allen MM, Creech RH, Tormey DC, et al. [phone]). "Toxicity and response criteria of the [facility]". [place] Oncol. 5 (6): 649-55    LABORATORY DATA:  Lab Results  Component Value Date   WBC 3.8 (L) 08/12/2022   HGB 10.7 (L)  08/12/2022   HCT 31.4 (L) 08/12/2022   MCV 85.1 08/12/2022   PLT 208 08/12/2022   Lab Results  Component Value Date   NA 138 08/12/2022   K 3.6 08/12/2022   CL 106 08/12/2022   CO2 25 08/12/2022   Lab Results  Component Value Date   ALT 15 08/12/2022   AST 23 08/12/2022   ALKPHOS 50 08/12/2022   BILITOT 0.2 (L) 08/12/2022      RADIOGRAPHY: MR Brain W Wo Contrast  Result Date: 08/15/2022 CLINICAL DATA:  Brain metastases, assess treatment response. Lung cancer. EXAM: MRI HEAD WITHOUT AND WITH CONTRAST TECHNIQUE: Multiplanar, multiecho pulse sequences of the brain and surrounding structures were obtained without and with intravenous contrast. CONTRAST:  8 cc of Vueway intravenous COMPARISON:  05/15/2022 FINDINGS: Brain: A lesion with patchy ill organized enhancement continues to enlarge, now measuring up to 18 mm (anterior to posterior) as compared to 13 mm (transverse) previously. Adjacent edema remains minor. No new lesion is seen. No incidental infarct, hemorrhage, hydrocephalus, or collection. Vascular: Major flow voids and vascular enhancements are preserved Skull and upper cervical spine: No focal marrow lesion Sinuses/Orbits: No mass or acute finding. Retention cyst along the floor of the right maxillary sinus. Other: 2.3 cm right parotid mass is stable from prior. IMPRESSION: 1. Continued, mild increase in the vermian lesion, enhancing area now measuring up to 18 mm. 2. No new lesion. Electronically Signed   By: Nicolas Allen M.D.   On: 08/15/2022 08:54        IMPRESSION/PLAN: 1. Stage IV non-small cell lung cancer, adenocarcinoma involving multifocal disease in the lung and brain metastases. We discussed the conversation about his imaging from conference and that the inflammatory changes are felt to be related to immunotherapy and or some radionecrosis. Though, since he's asymptomatic, we would continue to follow this closely. He was given cautions to call if he develops symptoms.  He is in agreement with this plan and we will plan to repeat his MRI in 3 months or sooner if needed. He will continue with Dr. Julien Allen as well.  2. Right parotid pleomorphic adenoma. He did have biopsy in 2023, and the findings were benign but have the risk of malignant transformation. He will be followed in ENT by Dr. Fredric Allen on an annual basis now for this.  This encounter was conducted via telephone.  The patient has provided two factor identification and has given verbal consent for this type of encounter and has been advised to only accept a meeting of this type in a secure network environment. The time spent during this encounter was 30 minutes including preparation, discussion, and coordination of the patient's care. The attendants for this meeting include  Nicolas Allen  and Nicolas Allen.  During the encounter,  Nicolas Allen were located at [facility] Radiation Oncology Department.  Nicolas Allen was located at home.       Carola Rhine, PAC

## 2022-08-28 ENCOUNTER — Ambulatory Visit (INDEPENDENT_AMBULATORY_CARE_PROVIDER_SITE_OTHER): Payer: Medicare Other | Admitting: Internal Medicine

## 2022-08-28 VITALS — BP 116/68 | HR 79 | Temp 97.6°F | Ht 72.0 in | Wt 173.0 lb

## 2022-08-28 DIAGNOSIS — R7302 Impaired glucose tolerance (oral): Secondary | ICD-10-CM

## 2022-08-28 DIAGNOSIS — Z0001 Encounter for general adult medical examination with abnormal findings: Secondary | ICD-10-CM

## 2022-08-28 DIAGNOSIS — I10 Essential (primary) hypertension: Secondary | ICD-10-CM

## 2022-08-28 DIAGNOSIS — Z125 Encounter for screening for malignant neoplasm of prostate: Secondary | ICD-10-CM

## 2022-08-28 DIAGNOSIS — E559 Vitamin D deficiency, unspecified: Secondary | ICD-10-CM

## 2022-08-28 DIAGNOSIS — E7800 Pure hypercholesterolemia, unspecified: Secondary | ICD-10-CM | POA: Diagnosis not present

## 2022-08-28 DIAGNOSIS — E538 Deficiency of other specified B group vitamins: Secondary | ICD-10-CM

## 2022-08-28 DIAGNOSIS — I700 Atherosclerosis of aorta: Secondary | ICD-10-CM

## 2022-08-28 LAB — CBC WITH DIFFERENTIAL/PLATELET
Basophils Absolute: 0 10*3/uL (ref 0.0–0.1)
Basophils Relative: 0.5 % (ref 0.0–3.0)
Eosinophils Absolute: 0.1 10*3/uL (ref 0.0–0.7)
Eosinophils Relative: 1.8 % (ref 0.0–5.0)
HCT: 33.4 % — ABNORMAL LOW (ref 39.0–52.0)
Hemoglobin: 11 g/dL — ABNORMAL LOW (ref 13.0–17.0)
Lymphocytes Relative: 53.4 % — ABNORMAL HIGH (ref 12.0–46.0)
Lymphs Abs: 2.2 10*3/uL (ref 0.7–4.0)
MCHC: 33 g/dL (ref 30.0–36.0)
MCV: 88.6 fl (ref 78.0–100.0)
Monocytes Absolute: 1.2 10*3/uL — ABNORMAL HIGH (ref 0.1–1.0)
Monocytes Relative: 29 % — ABNORMAL HIGH (ref 3.0–12.0)
Neutro Abs: 0.6 10*3/uL — ABNORMAL LOW (ref 1.4–7.7)
Neutrophils Relative %: 15.3 % — ABNORMAL LOW (ref 43.0–77.0)
Platelets: 144 10*3/uL — ABNORMAL LOW (ref 150.0–400.0)
RBC: 3.77 Mil/uL — ABNORMAL LOW (ref 4.22–5.81)
RDW: 17.5 % — ABNORMAL HIGH (ref 11.5–15.5)
WBC: 4.2 10*3/uL (ref 4.0–10.5)

## 2022-08-28 LAB — URINALYSIS, ROUTINE W REFLEX MICROSCOPIC
Bilirubin Urine: NEGATIVE
Hgb urine dipstick: NEGATIVE
Ketones, ur: NEGATIVE
Leukocytes,Ua: NEGATIVE
Nitrite: NEGATIVE
RBC / HPF: NONE SEEN (ref 0–?)
Specific Gravity, Urine: 1.02 (ref 1.000–1.030)
Total Protein, Urine: NEGATIVE
Urine Glucose: NEGATIVE
Urobilinogen, UA: 0.2 (ref 0.0–1.0)
pH: 6 (ref 5.0–8.0)

## 2022-08-28 LAB — VITAMIN D 25 HYDROXY (VIT D DEFICIENCY, FRACTURES): VITD: 43.8 ng/mL (ref 30.00–100.00)

## 2022-08-28 LAB — BASIC METABOLIC PANEL
BUN: 15 mg/dL (ref 6–23)
CO2: 26 mEq/L (ref 19–32)
Calcium: 8.7 mg/dL (ref 8.4–10.5)
Chloride: 105 mEq/L (ref 96–112)
Creatinine, Ser: 1.61 mg/dL — ABNORMAL HIGH (ref 0.40–1.50)
GFR: 44.6 mL/min — ABNORMAL LOW (ref >60.00)
Glucose, Bld: 88 mg/dL (ref 70–99)
Potassium: 4 mEq/L (ref 3.5–5.1)
Sodium: 138 mEq/L (ref 135–145)

## 2022-08-28 LAB — HEPATIC FUNCTION PANEL
ALT: 21 U/L (ref 0–53)
AST: 30 U/L (ref 0–37)
Albumin: 3.2 g/dL — ABNORMAL LOW (ref 3.5–5.2)
Alkaline Phosphatase: 57 U/L (ref 39–117)
Bilirubin, Direct: 0 mg/dL (ref 0.0–0.3)
Total Bilirubin: 0.2 mg/dL (ref 0.2–1.2)
Total Protein: 6.7 g/dL (ref 6.0–8.3)

## 2022-08-28 LAB — LIPID PANEL
Cholesterol: 162 mg/dL (ref 0–200)
HDL: 51.5 mg/dL (ref >39.00)
LDL Cholesterol: 99 mg/dL (ref 0–99)
NonHDL: 110.64
Total CHOL/HDL Ratio: 3
Triglycerides: 57 mg/dL (ref 0.0–149.0)
VLDL: 11.4 mg/dL (ref 0.0–40.0)

## 2022-08-28 LAB — HEMOGLOBIN A1C: Hgb A1c MFr Bld: 6.3 % (ref 4.6–6.5)

## 2022-08-28 LAB — TSH: TSH: 3.3 u[IU]/mL (ref 0.35–5.50)

## 2022-08-28 LAB — PSA: PSA: 0.34 ng/mL (ref 0.10–4.00)

## 2022-08-28 LAB — VITAMIN B12: Vitamin B-12: 642 pg/mL (ref 211–911)

## 2022-08-28 NOTE — Patient Instructions (Addendum)
Please continue all other medications as before, and refills have been done if requested.  Please have the pharmacy call with any other refills you may need.  Please continue your efforts at being more active, low cholesterol diet, and weight control.  You are otherwise up to date with prevention measures today.  Please keep your appointments with your specialists as you may have planned  Please go to the LAB at the blood drawing area for the tests to be done  You will be contacted by phone if any changes need to be made immediately.  Otherwise, you will receive a letter about your results with an explanation, but please check with MyChart first.  Please remember to sign up for MyChart if you have not done so, as this will be important to you in the future with finding out test results, communicating by private email, and scheduling acute appointments online when needed.  Please make an Appointment to return in 6 months, or sooner if needed, also with Labs done at the [facility] lab a few days ahead

## 2022-08-28 NOTE — Progress Notes (Signed)
Patient ID: Nicolas Allen, male   DOB: 06/20/1955, 67 y.o.   MRN: [id number]         Chief Complaint:: wellness exam and aortic atherosclerosis, low B12, htn, hyperglycemia, low vit d       HPI:  Nicolas Allen is a 67 y.o. male here for wellness exam; declines covid booster, o/w up to date                        Also Pt denies chest pain, increased sob or doe, wheezing, orthopnea, PND, increased LE swelling, palpitations, dizziness or syncope.   Pt denies polydipsia, polyuria, or new focal neuro s/s.    Pt denies fever, wt loss, night sweats, loss of appetite, or other constitutional symptoms     Wt Readings from Last 3 Encounters:  08/28/22 173 lb (78.5 kg)  08/12/22 174 lb 3.2 oz (79 kg)  07/22/22 171 lb 1.6 oz (77.6 kg)   BP Readings from Last 3 Encounters:  08/28/22 116/68  08/12/22 114/68  07/22/22 103/60   Immunization History  Administered Date(s) Administered   Fluad Quad(high Dose 65+) 02/27/2022   Influenza,inj,Quad PF,6+ Mos 06/30/2018, 02/24/2020, 02/26/2021   PFIZER(Purple Top)SARS-COV-2 Vaccination 09/02/2019, 09/27/2019, 04/09/2020   PNEUMOCOCCAL CONJUGATE-20 02/27/2022   Td 12/13/2008   Tdap 02/23/2019   Zoster Recombinat (Shingrix) 02/26/2021, 04/29/2021  There are no preventive care reminders to display for this patient.    Past Medical History:  Diagnosis Date   Anemia 06/24/2012   ANXIETY 02/03/2007   Cervical disc disease 02/12/2012   S/p surgury jan 2013   Erectile dysfunction 02/11/2011   GERD (gastroesophageal reflux disease)    GLUCOSE INTOLERANCE 01/04/2008   HYPERLIPIDEMIA 02/03/2007   HYPERTENSION 02/03/2007   IBS (irritable bowel syndrome)    Impaired glucose tolerance 02/11/2011   nscl ca 03/30/2020   Smoker 08/22/2014   Substance abuse ([place]) 2001   sober for 16 yrs   Past Surgical History:  Procedure Laterality Date   BRONCHIAL BIOPSY  03/30/2020   Procedure: BRONCHIAL BIOPSIES;  Surgeon: Garner Nash, DO;  Location: [place]  [facility];  Service: Pulmonary;;   BRONCHIAL BRUSHINGS  03/30/2020   Procedure: BRONCHIAL BRUSHINGS;  Surgeon: Garner Nash, DO;  Location: [place];  Service: Pulmonary;;   BRONCHIAL NEEDLE ASPIRATION BIOPSY  03/30/2020   Procedure: BRONCHIAL NEEDLE ASPIRATION BIOPSIES;  Surgeon: Garner Nash, DO;  Location: [place] [facility];  Service: Pulmonary;;   BRONCHIAL WASHINGS  03/30/2020   Procedure: BRONCHIAL WASHINGS;  Surgeon: Garner Nash, DO;  Location: [place];  Service: Pulmonary;;   COLONOSCOPY  2007   IR IMAGING GUIDED PORT INSERTION  04/23/2020   neck fusion  2012   C4   NO PAST SURGERIES     VIDEO BRONCHOSCOPY WITH ENDOBRONCHIAL NAVIGATION N/A 03/30/2020   Procedure: VIDEO BRONCHOSCOPY WITH ENDOBRONCHIAL NAVIGATION;  Surgeon: Garner Nash, DO;  Location: [place];  Service: Pulmonary;  Laterality: N/A;   VIDEO BRONCHOSCOPY WITH ENDOBRONCHIAL ULTRASOUND N/A 03/30/2020   Procedure: VIDEO BRONCHOSCOPY WITH ENDOBRONCHIAL ULTRASOUND;  Surgeon: Garner Nash, DO;  Location: [place];  Service: Pulmonary;  Laterality: N/A;    reports that he quit smoking about 2 years ago. His smoking use included cigarettes. He has a 45.00 pack-year smoking history. He has quit using smokeless tobacco.  His smokeless tobacco use included chew. He reports that he does not drink alcohol and does not use drugs. family history includes Cancer in his cousin and mother;  Hypertension in an other family member; Stroke in an other family member. No Known Allergies Current Outpatient Medications on File Prior to Visit  Medication Sig Dispense Refill   amLODipine-benazepril (LOTREL) 10-20 MG capsule Take 1 capsule by mouth once daily 90 capsule 3   aspirin 325 MG EC tablet Take 1 tablet (325 mg total) by mouth daily. 90 tablet 99   azithromycin (ZITHROMAX Z-PAK) 250 MG tablet Use as directed 6 each 0   b complex vitamins tablet Take 1 tablet by mouth daily.     cholecalciferol (VITAMIN D3)  25 MCG (1000 UNIT) tablet Take 1,000 Units by mouth daily.     folic acid (FOLVITE) 1 MG tablet Take 1 tablet by mouth once daily 90 tablet 3   Garlic 123XX123 MG CAPS Take 1,000 mg by mouth daily.      lidocaine-prilocaine (EMLA) cream Apply to the Port-A-Cath site 30-60 minutes before chemotherapy. 30 g 0   Omega-3 Fatty Acids (FISH OIL PO) Take 1 capsule by mouth daily.      polyethylene glycol (MIRALAX / GLYCOLAX) 17 g packet Take 17 g by mouth daily as needed.     prochlorperazine (COMPAZINE) 10 MG tablet Take 1 tablet (10 mg total) by mouth every 6 (six) hours as needed for nausea or vomiting. 30 tablet 0   rosuvastatin (CRESTOR) 40 MG tablet Take 1 tablet (40 mg total) by mouth daily. 90 tablet 3   vardenafil (LEVITRA) 20 MG tablet TAKE 1 TABLET BY MOUTH AS NEEDED FOR  ERECTILE  DYSFUNCTION 10 tablet 5   No current facility-administered medications on file prior to visit.        ROS:  All others reviewed and negative.  Objective        PE:  BP 116/68   Pulse 79   Temp 97.6 F (36.4 C) (Oral)   Ht 6' (1.829 m)   Wt 173 lb (78.5 kg)   SpO2 98%   BMI 23.46 kg/m                 Constitutional: Pt appears in NAD               HENT: Head: NCAT.                Right Ear: External ear normal.                 Left Ear: External ear normal.                Eyes: . Pupils are equal, round, and reactive to light. Conjunctivae and EOM are normal               Nose: without d/c or deformity               Neck: Neck supple. Gross normal ROM               Cardiovascular: Normal rate and regular rhythm.                 Pulmonary/Chest: Effort normal and breath sounds without rales or wheezing.                Abd:  Soft, NT, ND, + BS, no organomegaly               Neurological: Pt is alert. At baseline orientation, motor grossly intact               Skin: Skin is warm. No  rashes, no other new lesions, LE edema - none               Psychiatric: Pt behavior is normal without agitation   Micro:  none  Cardiac tracings I have personally interpreted today:  none  Pertinent Radiological findings (summarize): none   Lab Results  Component Value Date   WBC 4.2 08/28/2022   HGB 11.0 (L) 08/28/2022   HCT 33.4 (L) 08/28/2022   PLT 144.0 (L) 08/28/2022   GLUCOSE 88 08/28/2022   CHOL 162 08/28/2022   TRIG 57.0 08/28/2022   HDL 51.50 08/28/2022   LDLCALC 99 08/28/2022   ALT 21 08/28/2022   AST 30 08/28/2022   NA 138 08/28/2022   K 4.0 08/28/2022   CL 105 08/28/2022   CREATININE 1.61 (H) 08/28/2022   BUN 15 08/28/2022   CO2 26 08/28/2022   TSH 3.30 08/28/2022   PSA 0.34 08/28/2022   INR 1.0 03/30/2020   HGBA1C 6.3 08/28/2022   Assessment/Plan:  Nicolas Allen is a 67 y.o. Black or African American [2] male with  has a past medical history of Anemia (06/24/2012), ANXIETY (02/03/2007), Cervical disc disease (02/12/2012), Erectile dysfunction (02/11/2011), GERD (gastroesophageal reflux disease), GLUCOSE INTOLERANCE (01/04/2008), HYPERLIPIDEMIA (02/03/2007), HYPERTENSION (02/03/2007), IBS (irritable bowel syndrome), Impaired glucose tolerance (02/11/2011), nscl ca (03/30/2020), Smoker (08/22/2014), and Substance abuse ([place]) (2001).  Encounter for well adult exam with abnormal findings Age and sex appropriate education and counseling updated with regular exercise and diet Referrals for preventative services - none needed Immunizations addressed - declines covid booster Smoking counseling  - none needed Evidence for depression or other mood disorder - none significant Most recent labs reviewed. I have personally reviewed and have noted: 1) the patient's medical and social history 2) The patient's current medications and supplements 3) The patient's height, weight, and BMI have been recorded in the chart   Aortic atherosclerosis (HCC) Pt to continue crestor 40 mg qd, exercise, low chol diet  B12 deficiency Lab Results  Component Value Date   VITAMINB12 642 08/28/2022    Stable, cont oral replacement - b12 1000 mcg qd   Essential hypertension BP Readings from Last 3 Encounters:  08/28/22 116/68  08/12/22 114/68  07/22/22 103/60   Stable, pt to continue medical treatment lotrel 10-20 mg qd   Hyperlipidemia Lab Results  Component Value Date   LDLCALC 99 08/28/2022   Uncontrolled, goal ldl < 70,, pt to continue current statin crestor 40 mg qd and lower chol diet, declines add other tx such as zetia 10 for now    Impaired glucose tolerance Lab Results  Component Value Date   HGBA1C 6.3 08/28/2022   Stable, pt to continue current medical treatment  - diet, wt control   Vitamin D deficiency Last vitamin D Lab Results  Component Value Date   VD25OH 43.80 08/28/2022   Stable, cont oral replacement  Followup: Return in about 6 months (around 02/28/2023).  Cathlean Cower, MD 08/30/2022 8:44 PM [facility]

## 2022-08-30 ENCOUNTER — Encounter: Payer: Self-pay | Admitting: Internal Medicine

## 2022-08-30 NOTE — Assessment & Plan Note (Signed)
Lab Results  Component Value Date   [id number] 08/28/2022   Stable, cont oral replacement - b12 1000 mcg qd

## 2022-08-30 NOTE — Assessment & Plan Note (Signed)
Lab Results  Component Value Date   LDLCALC 99 08/28/2022   Uncontrolled, goal ldl < 70,, pt to continue current statin crestor 40 mg qd and lower chol diet, declines add other tx such as zetia 10 for now

## 2022-08-30 NOTE — Assessment & Plan Note (Signed)
Pt to continue crestor 40 mg qd, exercise, low chol diet

## 2022-08-30 NOTE — Assessment & Plan Note (Signed)
Last vitamin D Lab Results  Component Value Date   VD25OH 43.80 08/28/2022   Stable, cont oral replacement

## 2022-08-30 NOTE — Assessment & Plan Note (Signed)

## 2022-08-30 NOTE — Assessment & Plan Note (Signed)
BP Readings from Last 3 Encounters:  08/28/22 116/68  08/12/22 114/68  07/22/22 103/60   Stable, pt to continue medical treatment lotrel 10-20 mg qd

## 2022-08-30 NOTE — Assessment & Plan Note (Signed)
Lab Results  Component Value Date   HGBA1C 6.3 08/28/2022   Stable, pt to continue current medical treatment  - diet, wt control

## 2022-09-02 ENCOUNTER — Inpatient Hospital Stay: Payer: Medicare Other

## 2022-09-02 ENCOUNTER — Telehealth: Payer: Self-pay | Admitting: Medical Oncology

## 2022-09-02 ENCOUNTER — Inpatient Hospital Stay: Payer: Medicare Other | Admitting: Internal Medicine

## 2022-09-02 NOTE — Telephone Encounter (Signed)
No voicemail set up 

## 2022-09-03 NOTE — Progress Notes (Signed)
[facility] OFFICE PROGRESS NOTE  [name], MD [place] [id number]  DIAGNOSIS: Stage IV (T3, N2, M1c) non-small cell lung cancer favoring adenocarcinoma presented with right upper lobe lung mass in addition to right lower lobe, left lower lobe in addition to right hilar and mediastinal lymphadenopathy in addition to metastatic brain lesions diagnosed in October 2021.   Molecular studies by [employer]ant 360:   [id number], 9.5%,  PRIOR THERAPY: 1) SRS to 3 brain lesion under the care of Dr. Lisbeth Renshaw.  Completed on April 20, 2020 2) SRS to the recurrent brain lesion under the care of Dr. Lisbeth Renshaw. Completed on 02/12/22  CURRENT THERAPY: Systemic chemotherapy with carboplatin for AUC of 5, Alimta 500 mg/M2 and Keytruda 200 mg IV every 3 weeks.  First dose April 24, 2020.  Status post 41 cycles.  Starting from cycle #5 the patient will be on maintenance treatment with Alimta and Keytruda every 3 weeks.    INTERVAL HISTORY: Nicolas Allen 67 y.o. male returns to the clinic today for a follow-up visit. The patient is feeling fairly well today without any concerning complaints.   He is currently undergoing treatment with alimta and [place]. He tolerated his last cycle well without any concerning adverse side effects. He missed his appointment earlier this week due to confusion with his schedule. He denies any fever, chills, night sweats, or unexplained weight loss.  Denies any nausea, diarrhea, or vomiting. He takes stool softener for constipation. He sometimes will have intermittent constipation. Denies any chest pain, shortness of breath, or hemoptysis.  Denies any significant cough.  Denies any headache or visual changes. He completed SRS re-treatment to the brain lesion under the care of Dr. Lisbeth Renshaw on 02/12/22.  He is here today for evaluation repeat blood work before undergoing cycle #42.    MEDICAL HISTORY: Past Medical History:  Diagnosis Date   Anemia  06/24/2012   ANXIETY 02/03/2007   Cervical disc disease 02/12/2012   S/p surgury jan 2013   Erectile dysfunction 02/11/2011   GERD (gastroesophageal reflux disease)    GLUCOSE INTOLERANCE 01/04/2008   HYPERLIPIDEMIA 02/03/2007   HYPERTENSION 02/03/2007   IBS (irritable bowel syndrome)    Impaired glucose tolerance 02/11/2011   nscl ca 03/30/2020   Smoker 08/22/2014   Substance abuse ([place]) 2001   sober for 16 yrs    ALLERGIES:  has No Known Allergies.  MEDICATIONS:  Current Outpatient Medications  Medication Sig Dispense Refill   amLODipine-benazepril (LOTREL) 10-20 MG capsule Take 1 capsule by mouth once daily 90 capsule 3   aspirin 325 MG EC tablet Take 1 tablet (325 mg total) by mouth daily. 90 tablet 99   azithromycin (ZITHROMAX Z-PAK) 250 MG tablet Use as directed 6 each 0   b complex vitamins tablet Take 1 tablet by mouth daily.     cholecalciferol (VITAMIN D3) 25 MCG (1000 UNIT) tablet Take 1,000 Units by mouth daily.     folic acid (FOLVITE) 1 MG tablet Take 1 tablet by mouth once daily 90 tablet 3   Garlic 123XX123 MG CAPS Take 1,000 mg by mouth daily.      lidocaine-prilocaine (EMLA) cream Apply to the Port-A-Cath site 30-60 minutes before chemotherapy. 30 g 0   Omega-3 Fatty Acids (FISH OIL PO) Take 1 capsule by mouth daily.      polyethylene glycol (MIRALAX / GLYCOLAX) 17 g packet Take 17 g by mouth daily as needed.     prochlorperazine (COMPAZINE) 10  MG tablet Take 1 tablet (10 mg total) by mouth every 6 (six) hours as needed for nausea or vomiting. 30 tablet 0   rosuvastatin (CRESTOR) 40 MG tablet Take 1 tablet (40 mg total) by mouth daily. 90 tablet 3   vardenafil (LEVITRA) 20 MG tablet TAKE 1 TABLET BY MOUTH AS NEEDED FOR  ERECTILE  DYSFUNCTION 10 tablet 5   No current facility-administered medications for this visit.    SURGICAL HISTORY:  Past Surgical History:  Procedure Laterality Date   BRONCHIAL BIOPSY  03/30/2020   Procedure: BRONCHIAL BIOPSIES;  Surgeon:  Garner Nash, DO;  Location: [facility];  Service: Pulmonary;;   BRONCHIAL BRUSHINGS  03/30/2020   Procedure: BRONCHIAL BRUSHINGS;  Surgeon: Garner Nash, DO;  Location: [facility];  Service: Pulmonary;;   BRONCHIAL NEEDLE ASPIRATION BIOPSY  03/30/2020   Procedure: BRONCHIAL NEEDLE ASPIRATION BIOPSIES;  Surgeon: Garner Nash, DO;  Location: [facility];  Service: Pulmonary;;   BRONCHIAL WASHINGS  03/30/2020   Procedure: BRONCHIAL WASHINGS;  Surgeon: Garner Nash, DO;  Location: [facility];  Service: Pulmonary;;   COLONOSCOPY  2007   IR IMAGING GUIDED PORT INSERTION  04/23/2020   neck fusion  2012   C4   NO PAST SURGERIES     VIDEO BRONCHOSCOPY WITH ENDOBRONCHIAL NAVIGATION N/A 03/30/2020   Procedure: VIDEO BRONCHOSCOPY WITH ENDOBRONCHIAL NAVIGATION;  Surgeon: Garner Nash, DO;  Location: [facility];  Service: Pulmonary;  Laterality: N/A;   VIDEO BRONCHOSCOPY WITH ENDOBRONCHIAL ULTRASOUND N/A 03/30/2020   Procedure: VIDEO BRONCHOSCOPY WITH ENDOBRONCHIAL ULTRASOUND;  Surgeon: Garner Nash, DO;  Location: [facility];  Service: Pulmonary;  Laterality: N/A;    REVIEW OF SYSTEMS:   Review of Systems  Constitutional: Negative for appetite change, chills, fatigue, fever and unexpected weight change.  HENT:   Negative for mouth sores, nosebleeds, sore throat and trouble swallowing.   Eyes: Negative for eye problems and icterus.  Respiratory: Negative for cough, hemoptysis, shortness of breath and wheezing.   Cardiovascular: Negative for chest pain and leg swelling.  Gastrointestinal: Negative for abdominal pain, constipation, diarrhea, nausea and vomiting.  Genitourinary: Negative for bladder incontinence, difficulty urinating, dysuria, frequency and hematuria.   Musculoskeletal: Negative for back pain, gait problem, neck pain and neck stiffness.  Skin: Negative for itching and rash.  Neurological: Negative for dizziness, extremity weakness, gait problem,  headaches, light-headedness and seizures.  Hematological: Negative for adenopathy. Does not bruise/bleed easily.  Psychiatric/Behavioral: Negative for confusion, depression and sleep disturbance. The patient is not nervous/anxious.     PHYSICAL EXAMINATION:  Blood pressure 101/67, pulse 81, temperature 98 F (36.7 C), resp. rate 20, weight 174 lb 1.6 oz (79 kg), SpO2 100 %.  ECOG PERFORMANCE STATUS: 1  Physical Exam  Constitutional: Oriented to person, place, and time and well-developed, well-nourished, and in no distress.  HENT:  Head: Normocephalic and atraumatic.  Mouth/Throat: Oropharynx is clear and moist. No oropharyngeal exudate.  Eyes: Conjunctivae are normal. Right eye exhibits no discharge. Left eye exhibits no discharge. No scleral icterus.  Neck: Normal range of motion. Neck supple.  Cardiovascular: Normal rate, regular rhythm, normal heart sounds and intact distal pulses.   Pulmonary/Chest: Effort normal and breath sounds normal. No respiratory distress. No wheezes. No rales.  Abdominal: Soft. Bowel sounds are normal. Exhibits no distension and no mass. There is no tenderness.  Musculoskeletal: Normal range of motion. Exhibits no edema.  Lymphadenopathy:    No cervical adenopathy.  Neurological: Alert and oriented to person, place, and  time. Exhibits normal muscle tone. Gait normal. Coordination normal.  Skin: Skin is warm and dry. No rash noted. Not diaphoretic. No erythema. No pallor.  Psychiatric: Mood, memory and judgment normal.  Vitals reviewed.  LABORATORY DATA: Lab Results  Component Value Date   WBC 5.1 09/05/2022   HGB 10.4 (L) 09/05/2022   HCT 30.4 (L) 09/05/2022   MCV 84.7 09/05/2022   PLT 249 09/05/2022      Chemistry      Component Value Date/Time   NA 139 09/05/2022 0951   K 3.8 09/05/2022 0951   CL 108 09/05/2022 0951   CO2 25 09/05/2022 0951   BUN 16 09/05/2022 0951   CREATININE 1.57 (H) 09/05/2022 0951   CREATININE 0.88 02/21/2020 1115       Component Value Date/Time   CALCIUM 8.6 (L) 09/05/2022 0951   ALKPHOS 47 09/05/2022 0951   AST 24 09/05/2022 0951   ALT 14 09/05/2022 0951   BILITOT 0.3 09/05/2022 0951       RADIOGRAPHIC STUDIES:  MR Brain W Wo Contrast  Result Date: 08/15/2022 CLINICAL DATA:  Brain metastases, assess treatment response. Lung cancer. EXAM: MRI HEAD WITHOUT AND WITH CONTRAST TECHNIQUE: Multiplanar, multiecho pulse sequences of the brain and surrounding structures were obtained without and with intravenous contrast. CONTRAST:  8 cc of Vueway intravenous COMPARISON:  05/15/2022 FINDINGS: Brain: A lesion with patchy ill organized enhancement continues to enlarge, now measuring up to 18 mm (anterior to posterior) as compared to 13 mm (transverse) previously. Adjacent edema remains minor. No new lesion is seen. No incidental infarct, hemorrhage, hydrocephalus, or collection. Vascular: Major flow voids and vascular enhancements are preserved Skull and upper cervical spine: No focal marrow lesion Sinuses/Orbits: No mass or acute finding. Retention cyst along the floor of the right maxillary sinus. Other: 2.3 cm right parotid mass is stable from prior. IMPRESSION: 1. Continued, mild increase in the vermian lesion, enhancing area now measuring up to 18 mm. 2. No new lesion. Electronically Signed   By: Jorje Guild M.D.   On: 08/15/2022 08:54     ASSESSMENT/PLAN:  This is a very pleasant 67 year old African-American male diagnosed with stage IV (T3, N2, M1C) non-small cell lung cancer, adenocarcinoma.  He presented with a right upper lobe lung mass in addition to a right lower lobe, left lower lobe lung lesions with right hilar and mediastinal lymphadenopathy.  He also had metastatic disease to the brain.  He was diagnosed in October 2022.  His molecular studies by guardant 360 did not show any actionable mutations.    The patient underwent SRS treatment to the metastatic brain lesions under the care of Dr. Lisbeth Renshaw.   This was completed on 04/20/2021.    He completed re-treatment SRS to the progressive brain lesion in the cerebellum under the care of Dr. Lisbeth Renshaw on 02/12/22.  The patient is currently undergoing systemic chemotherapy/immunotherapy.  He started with carboplatin for an AUC of 5, Alimta 500 mg per metered squared, Keytruda 200 mg IV every 3 weeks.  He is status post 41 cycles.  Starting from cycle #5, the patient has been on maintenance treatment with Alimta and Keytruda IV every 3 weeks. Starting from cycle #22, the patient has been on single agent alimta.    Labs were reviewed. Creatinine slightly elevated today at 1.57 and GFR 49. I reviewed with Dr. Julien Nordmann. He recommends that he proceed with cycle #42 today as scheduled. The patient was instructed to hydrate more.   We will see the  patient back for follow-up visit in 3 weeks for evaluation repeat blood work before undergoing cycle #43.   At a prior appointment, he did have a pleural effusion that he was asymptomatic from.  We previously reviewed signs and symptoms of worsening effusion that would warrant thoracentesis.  The patient denies any breathing changes at this time. We will likely order his next restaging CT scan at his next appointment.   The patient was advised to call immediately if she has any concerning symptoms in the interval. The patient voices understanding of current disease status and treatment options and is in agreement with the current care plan. All questions were answered. The patient knows to call the clinic with any problems, questions or concerns. We can certainly see the patient much sooner if necessary      No orders of the defined types were placed in this encounter.    The total time spent in the appointment was 20-29 minutes.   Marshia Tropea L Tomas Schamp, PA-C 09/05/22

## 2022-09-05 ENCOUNTER — Inpatient Hospital Stay: Payer: Medicare Other

## 2022-09-05 ENCOUNTER — Inpatient Hospital Stay: Payer: Medicare Other | Admitting: Physician Assistant

## 2022-09-05 ENCOUNTER — Other Ambulatory Visit: Payer: Self-pay

## 2022-09-05 VITALS — BP 105/61 | HR 65 | Temp 98.0°F

## 2022-09-05 VITALS — BP 101/67 | HR 81 | Temp 98.0°F | Resp 20 | Wt 174.1 lb

## 2022-09-05 DIAGNOSIS — C3491 Malignant neoplasm of unspecified part of right bronchus or lung: Secondary | ICD-10-CM

## 2022-09-05 DIAGNOSIS — Z5111 Encounter for antineoplastic chemotherapy: Secondary | ICD-10-CM

## 2022-09-05 DIAGNOSIS — C3411 Malignant neoplasm of upper lobe, right bronchus or lung: Secondary | ICD-10-CM | POA: Diagnosis present

## 2022-09-05 DIAGNOSIS — C7931 Secondary malignant neoplasm of brain: Secondary | ICD-10-CM | POA: Diagnosis not present

## 2022-09-05 DIAGNOSIS — C3432 Malignant neoplasm of lower lobe, left bronchus or lung: Secondary | ICD-10-CM | POA: Diagnosis not present

## 2022-09-05 DIAGNOSIS — Z95828 Presence of other vascular implants and grafts: Secondary | ICD-10-CM

## 2022-09-05 DIAGNOSIS — Z5112 Encounter for antineoplastic immunotherapy: Secondary | ICD-10-CM

## 2022-09-05 LAB — CBC WITH DIFFERENTIAL (CANCER CENTER ONLY)
Abs Immature Granulocytes: 0.02 10*3/uL (ref 0.00–0.07)
Basophils Absolute: 0 10*3/uL (ref 0.0–0.1)
Basophils Relative: 0 %
Eosinophils Absolute: 0.1 10*3/uL (ref 0.0–0.5)
Eosinophils Relative: 2 %
HCT: 30.4 % — ABNORMAL LOW (ref 39.0–52.0)
Hemoglobin: 10.4 g/dL — ABNORMAL LOW (ref 13.0–17.0)
Immature Granulocytes: 0 %
Lymphocytes Relative: 40 %
Lymphs Abs: 2 10*3/uL (ref 0.7–4.0)
MCH: 29 pg (ref 26.0–34.0)
MCHC: 34.2 g/dL (ref 30.0–36.0)
MCV: 84.7 fL (ref 80.0–100.0)
Monocytes Absolute: 0.7 10*3/uL (ref 0.1–1.0)
Monocytes Relative: 14 %
Neutro Abs: 2.2 10*3/uL (ref 1.7–7.7)
Neutrophils Relative %: 44 %
Platelet Count: 249 10*3/uL (ref 150–400)
RBC: 3.59 MIL/uL — ABNORMAL LOW (ref 4.22–5.81)
RDW: 17.9 % — ABNORMAL HIGH (ref 11.5–15.5)
WBC Count: 5.1 10*3/uL (ref 4.0–10.5)
nRBC: 0 % (ref 0.0–0.2)

## 2022-09-05 LAB — CMP (CANCER CENTER ONLY)
ALT: 14 U/L (ref 0–44)
AST: 24 U/L (ref 15–41)
Albumin: 3.2 g/dL — ABNORMAL LOW (ref 3.5–5.0)
Alkaline Phosphatase: 47 U/L (ref 38–126)
Anion gap: 6 (ref 5–15)
BUN: 16 mg/dL (ref 8–23)
CO2: 25 mmol/L (ref 22–32)
Calcium: 8.6 mg/dL — ABNORMAL LOW (ref 8.9–10.3)
Chloride: 108 mmol/L (ref 98–111)
Creatinine: 1.57 mg/dL — ABNORMAL HIGH (ref 0.61–1.24)
GFR, Estimated: 49 mL/min — ABNORMAL LOW (ref >60)
Glucose, Bld: 81 mg/dL (ref 70–99)
Potassium: 3.8 mmol/L (ref 3.5–5.1)
Sodium: 139 mmol/L (ref 135–145)
Total Bilirubin: 0.3 mg/dL (ref 0.3–1.2)
Total Protein: 6.5 g/dL (ref 6.5–8.1)

## 2022-09-05 MED ORDER — SODIUM CHLORIDE 0.9% FLUSH
10.0000 mL | Freq: Once | INTRAVENOUS | Status: AC
  Administered 2022-09-05: 10 mL

## 2022-09-05 MED ORDER — SODIUM CHLORIDE 0.9 % IV SOLN
500.0000 mg/m2 | Freq: Once | INTRAVENOUS | Status: AC
  Administered 2022-09-05: 1000 mg via INTRAVENOUS
  Filled 2022-09-05: qty 40

## 2022-09-05 MED ORDER — SODIUM CHLORIDE 0.9 % IV SOLN: Freq: Once | INTRAVENOUS | Status: AC

## 2022-09-05 MED ORDER — CYANOCOBALAMIN 1000 MCG/ML IJ SOLN
1000.0000 ug | Freq: Once | INTRAMUSCULAR | Status: AC
  Administered 2022-09-05: 1000 ug via INTRAMUSCULAR
  Filled 2022-09-05: qty 1

## 2022-09-05 MED ORDER — PROCHLORPERAZINE MALEATE 10 MG PO TABS
10.0000 mg | ORAL_TABLET | Freq: Once | ORAL | Status: AC
  Administered 2022-09-05: 10 mg via ORAL
  Filled 2022-09-05: qty 1

## 2022-09-05 MED ORDER — HEPARIN SOD (PORK) LOCK FLUSH 100 UNIT/ML IV SOLN
500.0000 [IU] | Freq: Once | INTRAVENOUS | Status: AC | PRN
  Administered 2022-09-05: 500 [IU]

## 2022-09-05 MED ORDER — SODIUM CHLORIDE 0.9% FLUSH
10.0000 mL | INTRAVENOUS | Status: DC | PRN
  Administered 2022-09-05: 10 mL

## 2022-09-05 MED ORDER — SODIUM CHLORIDE 0.9 % IV SOLN
200.0000 mg | Freq: Once | INTRAVENOUS | Status: AC
  Administered 2022-09-05: 200 mg via INTRAVENOUS
  Filled 2022-09-05: qty 8

## 2022-09-05 NOTE — Patient Instructions (Signed)
[facility]  Discharge Instructions: Thank you for choosing [facility] to provide your oncology and hematology care.   If you have a lab appointment with the Cancer Center, please go directly to the Cancer Center and check in at the registration area.   Wear comfortable clothing and clothing appropriate for easy access to any Portacath or PICC line.   We strive to give you quality time with your provider. You may need to reschedule your appointment if you arrive late (15 or more minutes).  Arriving late affects you and other patients whose appointments are after yours.  Also, if you miss three or more appointments without notifying the office, you may be dismissed from the clinic at the provider's discretion.      For prescription refill requests, have your pharmacy contact our office and allow 72 hours for refills to be completed.    Today you received the following chemotherapy and/or immunotherapy agents: Keytruda, Alimta.       To help prevent nausea and vomiting after your treatment, we encourage you to take your nausea medication as directed.  BELOW ARE SYMPTOMS THAT SHOULD BE REPORTED IMMEDIATELY: *FEVER GREATER THAN 100.4 F (38 C) OR HIGHER *CHILLS OR SWEATING *NAUSEA AND VOMITING THAT IS NOT CONTROLLED WITH YOUR NAUSEA MEDICATION *UNUSUAL SHORTNESS OF BREATH *UNUSUAL BRUISING OR BLEEDING *URINARY PROBLEMS (pain or burning when urinating, or frequent urination) *BOWEL PROBLEMS (unusual diarrhea, constipation, pain near the anus) TENDERNESS IN MOUTH AND THROAT WITH OR WITHOUT PRESENCE OF ULCERS (sore throat, sores in mouth, or a toothache) UNUSUAL RASH, SWELLING OR PAIN  UNUSUAL VAGINAL DISCHARGE OR ITCHING   Items with * indicate a potential emergency and should be followed up as soon as possible or go to the Emergency Department if any problems should occur.  Please show the CHEMOTHERAPY ALERT CARD or IMMUNOTHERAPY ALERT CARD  at check-in to the Emergency Department and triage nurse.  Should you have questions after your visit or need to cancel or reschedule your appointment, please contact [facility]  Dept: [phone]  and follow the prompts.  Office hours are 8:00 a.m. to 4:30 p.m. Monday - Friday. Please note that voicemails left after 4:00 p.m. may not be returned until the following business day.  We are closed weekends and major holidays. You have access to a nurse at all times for urgent questions. Please call the main number to the clinic Dept: [phone] and follow the prompts.   For any non-urgent questions, you may also contact your provider using MyChart. We now offer e-Visits for anyone 18 and older to request care online for non-urgent symptoms. For details visit [email or web address].   Also download the MyChart app! Go to the app store, search "MyChart", open the app, select [facility], and log in with your MyChart username and password.   

## 2022-09-05 NOTE — Progress Notes (Signed)
Per Cassie, PA, OK to treat with Cr 1.57.

## 2022-09-08 ENCOUNTER — Other Ambulatory Visit: Payer: Self-pay | Admitting: Radiation Therapy

## 2022-09-08 DIAGNOSIS — C7931 Secondary malignant neoplasm of brain: Secondary | ICD-10-CM

## 2022-09-09 ENCOUNTER — Telehealth: Payer: Self-pay | Admitting: Radiation Therapy

## 2022-09-09 ENCOUNTER — Other Ambulatory Visit: Payer: Self-pay | Admitting: Radiation Therapy

## 2022-09-09 DIAGNOSIS — C7931 Secondary malignant neoplasm of brain: Secondary | ICD-10-CM

## 2022-09-09 NOTE — Telephone Encounter (Signed)
I spoke with Nicolas Allen about his upcoming brain MRI and telephone follow-up with Bryson Ha. An order has been entered for his port to be accessed by [facility] per his request.   Mont Dutton R.T.(R)(T) Radiation Special Procedures Navigator

## 2022-09-09 NOTE — Progress Notes (Signed)
Orders entered for port to be accessed by [facility] for his brain MRI.

## 2022-09-16 ENCOUNTER — Other Ambulatory Visit: Payer: Self-pay

## 2022-09-23 ENCOUNTER — Ambulatory Visit: Payer: Medicare Other | Admitting: Physician Assistant

## 2022-09-23 ENCOUNTER — Other Ambulatory Visit: Payer: Self-pay

## 2022-09-23 ENCOUNTER — Ambulatory Visit: Payer: Medicare Other

## 2022-09-23 ENCOUNTER — Other Ambulatory Visit: Payer: Medicare Other

## 2022-09-23 MED ORDER — ROSUVASTATIN CALCIUM 40 MG PO TABS: 40.0000 mg | ORAL_TABLET | Freq: Every day | ORAL | 3 refills | Status: DC

## 2022-09-23 NOTE — Progress Notes (Unsigned)
[facility] OFFICE PROGRESS NOTE  Corwin Levins, MD [address] [place] [address]  DIAGNOSIS: Stage IV (T3, N2, M1c) non-small cell lung cancer favoring adenocarcinoma presented with right upper lobe lung mass in addition to right lower lobe, left lower lobe in addition to right hilar and mediastinal lymphadenopathy in addition to metastatic brain lesions diagnosed in October 2021.   Molecular studies by [employer]:   [id number], 9.5%,  PRIOR THERAPY: 1) SRS to 3 brain lesion under the care of Dr. Mitzi Hansen.  Completed on April 20, 2020 2) SRS to the recurrent brain lesion under the care of Dr. Mitzi Hansen. Completed on 02/12/22  CURRENT THERAPY: Systemic chemotherapy with carboplatin for AUC of 5, Alimta 500 mg/M2 and Keytruda 200 mg IV every 3 weeks.  First dose April 24, 2020.  Status post 42 cycles.  Starting from cycle #5 the patient will be on maintenance treatment with Alimta and Keytruda every 3 weeks. His dose of alimta was reduced to 400 mg/m2 starting from cycle #43 due to elevated creatinine   INTERVAL HISTORY: Nicolas Allen 67 y.o. male returns to the clinic today for a follow-up visit. The patient is feeling fairly well today without any concerning complaints.  He mentions in the interval since last being seen, he drank two ensures back to back and had diarrhea after that. He then felt lightheaded. He drink electrolyte drinks and felt better. He had one episode of diarrhea total. Otherwise, he is feeling better at this time. In general, he feels that he hydrates well at home. He drinks 2-3 bottles of water per day.    He is currently undergoing treatment with alimta and [place]. He tolerated his last cycle well without any concerning adverse side effects. He denies any fever, chills, night sweats, or unexplained weight loss.  He denies any abnormal bleeding or bruising. He is a little more anemic today. Denies any nausea, or vomiting. Denies any chest pain,  shortness of breath, or hemoptysis.  Denies any significant cough.  Denies any headache or visual changes. He completed SRS re-treatment to the brain lesion under the care of Dr. Mitzi Hansen on 02/12/22. He is scheduled for repeat brain MRI on 11/11/22. He is here today for evaluation repeat blood work before undergoing cycle #43   MEDICAL HISTORY: Past Medical History:  Diagnosis Date   Anemia 06/24/2012   ANXIETY 02/03/2007   Cervical disc disease 02/12/2012   S/p surgury jan 2013   Erectile dysfunction 02/11/2011   GERD (gastroesophageal reflux disease)    GLUCOSE INTOLERANCE 01/04/2008   HYPERLIPIDEMIA 02/03/2007   HYPERTENSION 02/03/2007   IBS (irritable bowel syndrome)    Impaired glucose tolerance 02/11/2011   nscl ca 03/30/2020   Smoker 08/22/2014   Substance abuse (HCC) 2001   sober for 16 yrs    ALLERGIES:  has No Known Allergies.  MEDICATIONS:  Current Outpatient Medications  Medication Sig Dispense Refill   amLODipine-benazepril (LOTREL) 10-20 MG capsule Take 1 capsule by mouth once daily 90 capsule 3   aspirin 325 MG EC tablet Take 1 tablet (325 mg total) by mouth daily. 90 tablet 99   azithromycin (ZITHROMAX Z-PAK) 250 MG tablet Use as directed 6 each 0   b complex vitamins tablet Take 1 tablet by mouth daily.     cholecalciferol (VITAMIN D3) 25 MCG (1000 UNIT) tablet Take 1,000 Units by mouth daily.     folic acid (FOLVITE) 1 MG tablet Take 1 tablet by mouth once  daily 90 tablet 3   Garlic 1000 MG CAPS Take 1,000 mg by mouth daily.      lidocaine-prilocaine (EMLA) cream Apply to the Port-A-Cath site 30-60 minutes before chemotherapy. 30 g 0   Omega-3 Fatty Acids (FISH OIL PO) Take 1 capsule by mouth daily.      polyethylene glycol (MIRALAX / GLYCOLAX) 17 g packet Take 17 g by mouth daily as needed.     prochlorperazine (COMPAZINE) 10 MG tablet Take 1 tablet (10 mg total) by mouth every 6 (six) hours as needed for nausea or vomiting. 30 tablet 0   rosuvastatin (CRESTOR)  40 MG tablet Take 1 tablet (40 mg total) by mouth daily. 90 tablet 3   vardenafil (LEVITRA) 20 MG tablet TAKE 1 TABLET BY MOUTH AS NEEDED FOR  ERECTILE  DYSFUNCTION 10 tablet 5   No current facility-administered medications for this visit.    SURGICAL HISTORY:  Past Surgical History:  Procedure Laterality Date   BRONCHIAL BIOPSY  03/30/2020   Procedure: BRONCHIAL BIOPSIES;  Surgeon: Josephine Igo, DO;  Location: [facility];  Service: Pulmonary;;   BRONCHIAL BRUSHINGS  03/30/2020   Procedure: BRONCHIAL BRUSHINGS;  Surgeon: Josephine Igo, DO;  Location: [facility];  Service: Pulmonary;;   BRONCHIAL NEEDLE ASPIRATION BIOPSY  03/30/2020   Procedure: BRONCHIAL NEEDLE ASPIRATION BIOPSIES;  Surgeon: Josephine Igo, DO;  Location: [facility];  Service: Pulmonary;;   BRONCHIAL WASHINGS  03/30/2020   Procedure: BRONCHIAL WASHINGS;  Surgeon: Josephine Igo, DO;  Location: [facility];  Service: Pulmonary;;   COLONOSCOPY  2007   IR IMAGING GUIDED PORT INSERTION  04/23/2020   neck fusion  2012   C4   NO PAST SURGERIES     VIDEO BRONCHOSCOPY WITH ENDOBRONCHIAL NAVIGATION N/A 03/30/2020   Procedure: VIDEO BRONCHOSCOPY WITH ENDOBRONCHIAL NAVIGATION;  Surgeon: Josephine Igo, DO;  Location: [facility];  Service: Pulmonary;  Laterality: N/A;   VIDEO BRONCHOSCOPY WITH ENDOBRONCHIAL ULTRASOUND N/A 03/30/2020   Procedure: VIDEO BRONCHOSCOPY WITH ENDOBRONCHIAL ULTRASOUND;  Surgeon: Josephine Igo, DO;  Location: [facility];  Service: Pulmonary;  Laterality: N/A;    REVIEW OF SYSTEMS:   Review of Systems  Constitutional: Negative for appetite change, chills, fatigue, fever and unexpected weight change.  HENT:   Negative for mouth sores, nosebleeds, sore throat and trouble swallowing.   Eyes: Negative for eye problems and icterus.  Respiratory: Negative for cough, hemoptysis, shortness of breath and wheezing.   Cardiovascular: Negative for chest pain and leg swelling.   Gastrointestinal: Negative for abdominal pain, constipation, diarrhea (none at this time), nausea and vomiting.  Genitourinary: Negative for bladder incontinence, difficulty urinating, dysuria, frequency and hematuria.   Musculoskeletal: Negative for back pain, gait problem, neck pain and neck stiffness.  Skin: Negative for itching and rash.  Neurological: Negative for dizziness, extremity weakness, gait problem, headaches, light-headedness and seizures.  Hematological: Negative for adenopathy. Does not bruise/bleed easily.  Psychiatric/Behavioral: Negative for confusion, depression and sleep disturbance. The patient is not nervous/anxious.     PHYSICAL EXAMINATION:  Blood pressure (!) 107/59, pulse 89, temperature 98.1 F (36.7 C), temperature source Oral, resp. rate 15, weight 173 lb 12.8 oz (78.8 kg), SpO2 100 %.  ECOG PERFORMANCE STATUS: 1  Physical Exam  Constitutional: Oriented to person, place, and time and well-developed, well-nourished, and in no distress.  HENT:  Head: Normocephalic and atraumatic.  Mouth/Throat: Oropharynx is clear and moist. No oropharyngeal exudate.  Eyes: Conjunctivae are normal. Right eye exhibits no discharge. Left eye  exhibits no discharge. No scleral icterus.  Neck: Normal range of motion. Neck supple.  Cardiovascular: Normal rate, regular rhythm, normal heart sounds and intact distal pulses.   Pulmonary/Chest: Effort normal and breath sounds normal. No respiratory distress. No wheezes. No rales.  Abdominal: Soft. Bowel sounds are normal. Exhibits no distension and no mass. There is no tenderness.  Musculoskeletal: Normal range of motion. Exhibits no edema.  Lymphadenopathy:    No cervical adenopathy.  Neurological: Alert and oriented to person, place, and time. Exhibits normal muscle tone. Gait normal. Coordination normal.  Skin: Skin is warm and dry. No rash noted. Not diaphoretic. No erythema. No pallor.  Psychiatric: Mood, memory and judgment  normal.  Vitals reviewed.  LABORATORY DATA: Lab Results  Component Value Date   WBC 4.3 09/25/2022   HGB 9.3 (L) 09/25/2022   HCT 27.4 (L) 09/25/2022   MCV 86.2 09/25/2022   PLT 232 09/25/2022      Chemistry      Component Value Date/Time   NA 137 09/25/2022 1033   K 3.3 (L) 09/25/2022 1033   CL 106 09/25/2022 1033   CO2 22 09/25/2022 1033   BUN 19 09/25/2022 1033   CREATININE 1.64 (H) 09/25/2022 1033   CREATININE 0.88 02/21/2020 1115      Component Value Date/Time   CALCIUM 8.6 (L) 09/25/2022 1033   ALKPHOS 51 09/25/2022 1033   AST 20 09/25/2022 1033   ALT 12 09/25/2022 1033   BILITOT 0.2 (L) 09/25/2022 1033       RADIOGRAPHIC STUDIES:  No results found.   ASSESSMENT/PLAN:  This is a very pleasant 67 year old African-American male diagnosed with stage IV (T3, N2, M1C) non-small cell lung cancer, adenocarcinoma.  He presented with a right upper lobe lung mass in addition to a right lower lobe, left lower lobe lung lesions with right hilar and mediastinal lymphadenopathy.  He also had metastatic disease to the brain.  He was diagnosed in October 2022.  His molecular studies by guardant 360 did not show any actionable mutations.    The patient underwent SRS treatment to the metastatic brain lesions under the care of Dr. Mitzi HansenMoody.  This was completed on 04/20/2021.    He completed re-treatment SRS to the progressive brain lesion in the cerebellum under the care of Dr. Mitzi HansenMoody on 02/12/22.   The patient is currently undergoing systemic chemotherapy/immunotherapy.  He started with carboplatin for an AUC of 5, Alimta 500 mg per metered squared, Keytruda 200 mg IV every 3 weeks.  He is status post 42 cycles.  Starting from cycle #5, the patient has been on maintenance treatment with Alimta and Keytruda IV every 3 weeks. Starting from cycle #22, the patient has been on single agent alimta. His dose of alimta will be reduced to 400 mg/m2 starting from cycle #43 due to elevated  creatinine.    Labs were reviewed. His creatinine has been more elevated recently. The patient feels like he is hydrating appropriately. I reviewed with Dr. Arbutus PedMohamed. We will reduce the dose of alimta to 400 mg/m2. He recommends that he proceed with cycle #43 today as scheduled. We will also arrange for 1 L of fluids over 2 hours today.    We will see the patient back for follow-up visit in 3 weeks for evaluation repeat blood work before undergoing cycle #44.  I will arrange for a restaging CT scan prior to his next appointment. I will order this without contrast due to his elevated creatinine.    At  a prior appointment, he did have a pleural effusion that he was asymptomatic from.  We previously reviewed signs and symptoms of worsening effusion that would warrant thoracentesis.  The patient denies any breathing changes at this time.   His potassium is slightly low. I instructed him to increase his intake of potassium rich foods.   The patient was advised to call immediately if she has any concerning symptoms in the interval. The patient voices understanding of current disease status and treatment options and is in agreement with the current care plan. All questions were answered. The patient knows to call the clinic with any problems, questions or concerns. We can certainly see the patient much sooner if necessary   Orders Placed This Encounter  Procedures   CT CHEST ABDOMEN PELVIS WO CONTRAST    Standing Status:   Future    Standing Expiration Date:   09/25/2023    Order Specific Question:   Preferred imaging location?    Answer:   [facility]    Order Specific Question:   Is Oral Contrast requested for this exam?    Answer:   Yes, Per Radiology protocol    Order Specific Question:   Does the patient have a contrast media/X-ray dye allergy?    Answer:   No   CBC with Differential (Cancer Center Only)    Standing Status:   Future    Standing Expiration Date:   11/25/2023   CMP  (Cancer Center only)    Standing Status:   Future    Standing Expiration Date:   11/25/2023   CBC with Differential (Cancer Center Only)    Standing Status:   Future    Standing Expiration Date:   12/16/2023   CMP (Cancer Center only)    Standing Status:   Future    Standing Expiration Date:   12/16/2023     The total time spent in the appointment was 20-29 minutes.   Romie Tay L Anorah Trias, PA-C 09/25/22

## 2022-09-25 ENCOUNTER — Other Ambulatory Visit: Payer: Self-pay

## 2022-09-25 ENCOUNTER — Inpatient Hospital Stay: Payer: Medicare Other | Admitting: Physician Assistant

## 2022-09-25 ENCOUNTER — Inpatient Hospital Stay: Payer: Medicare Other | Attending: Internal Medicine

## 2022-09-25 ENCOUNTER — Inpatient Hospital Stay: Payer: Medicare Other

## 2022-09-25 VITALS — BP 107/59 | HR 89 | Temp 98.1°F | Resp 15 | Wt 173.8 lb

## 2022-09-25 DIAGNOSIS — C3491 Malignant neoplasm of unspecified part of right bronchus or lung: Secondary | ICD-10-CM | POA: Diagnosis not present

## 2022-09-25 DIAGNOSIS — Z79899 Other long term (current) drug therapy: Secondary | ICD-10-CM | POA: Insufficient documentation

## 2022-09-25 DIAGNOSIS — R7989 Other specified abnormal findings of blood chemistry: Secondary | ICD-10-CM | POA: Diagnosis not present

## 2022-09-25 DIAGNOSIS — Z5111 Encounter for antineoplastic chemotherapy: Secondary | ICD-10-CM

## 2022-09-25 DIAGNOSIS — Z95828 Presence of other vascular implants and grafts: Secondary | ICD-10-CM

## 2022-09-25 DIAGNOSIS — C7931 Secondary malignant neoplasm of brain: Secondary | ICD-10-CM | POA: Diagnosis not present

## 2022-09-25 DIAGNOSIS — C3411 Malignant neoplasm of upper lobe, right bronchus or lung: Secondary | ICD-10-CM | POA: Insufficient documentation

## 2022-09-25 DIAGNOSIS — Z5112 Encounter for antineoplastic immunotherapy: Secondary | ICD-10-CM | POA: Diagnosis not present

## 2022-09-25 DIAGNOSIS — Z87891 Personal history of nicotine dependence: Secondary | ICD-10-CM | POA: Insufficient documentation

## 2022-09-25 LAB — CBC WITH DIFFERENTIAL (CANCER CENTER ONLY)
Abs Immature Granulocytes: 0.02 10*3/uL (ref 0.00–0.07)
Basophils Absolute: 0 10*3/uL (ref 0.0–0.1)
Basophils Relative: 0 %
Eosinophils Absolute: 0.1 10*3/uL (ref 0.0–0.5)
Eosinophils Relative: 3 %
HCT: 27.4 % — ABNORMAL LOW (ref 39.0–52.0)
Hemoglobin: 9.3 g/dL — ABNORMAL LOW (ref 13.0–17.0)
Immature Granulocytes: 1 %
Lymphocytes Relative: 39 %
Lymphs Abs: 1.7 10*3/uL (ref 0.7–4.0)
MCH: 29.2 pg (ref 26.0–34.0)
MCHC: 33.9 g/dL (ref 30.0–36.0)
MCV: 86.2 fL (ref 80.0–100.0)
Monocytes Absolute: 0.4 10*3/uL (ref 0.1–1.0)
Monocytes Relative: 9 %
Neutro Abs: 2.1 10*3/uL (ref 1.7–7.7)
Neutrophils Relative %: 48 %
Platelet Count: 232 10*3/uL (ref 150–400)
RBC: 3.18 MIL/uL — ABNORMAL LOW (ref 4.22–5.81)
RDW: 18.3 % — ABNORMAL HIGH (ref 11.5–15.5)
WBC Count: 4.3 10*3/uL (ref 4.0–10.5)
nRBC: 0 % (ref 0.0–0.2)

## 2022-09-25 LAB — CMP (CANCER CENTER ONLY)
ALT: 12 U/L (ref 0–44)
AST: 20 U/L (ref 15–41)
Albumin: 3.1 g/dL — ABNORMAL LOW (ref 3.5–5.0)
Alkaline Phosphatase: 51 U/L (ref 38–126)
Anion gap: 9 (ref 5–15)
BUN: 19 mg/dL (ref 8–23)
CO2: 22 mmol/L (ref 22–32)
Calcium: 8.6 mg/dL — ABNORMAL LOW (ref 8.9–10.3)
Chloride: 106 mmol/L (ref 98–111)
Creatinine: 1.64 mg/dL — ABNORMAL HIGH (ref 0.61–1.24)
GFR, Estimated: 46 mL/min — ABNORMAL LOW (ref >60)
Glucose, Bld: 174 mg/dL — ABNORMAL HIGH (ref 70–99)
Potassium: 3.3 mmol/L — ABNORMAL LOW (ref 3.5–5.1)
Sodium: 137 mmol/L (ref 135–145)
Total Bilirubin: 0.2 mg/dL — ABNORMAL LOW (ref 0.3–1.2)
Total Protein: 6.6 g/dL (ref 6.5–8.1)

## 2022-09-25 MED ORDER — SODIUM CHLORIDE 0.9% FLUSH
10.0000 mL | INTRAVENOUS | Status: DC | PRN
  Administered 2022-09-25 (×2): 10 mL

## 2022-09-25 MED ORDER — SODIUM CHLORIDE 0.9 % IV SOLN
200.0000 mg | Freq: Once | INTRAVENOUS | Status: AC
  Administered 2022-09-25: 200 mg via INTRAVENOUS
  Filled 2022-09-25: qty 200

## 2022-09-25 MED ORDER — SODIUM CHLORIDE 0.9 % IV SOLN: Freq: Once | INTRAVENOUS | Status: DC

## 2022-09-25 MED ORDER — SODIUM CHLORIDE 0.9 % IV SOLN
400.0000 mg/m2 | Freq: Once | INTRAVENOUS | Status: AC
  Administered 2022-09-25: 800 mg via INTRAVENOUS
  Filled 2022-09-25: qty 20

## 2022-09-25 MED ORDER — PROCHLORPERAZINE MALEATE 10 MG PO TABS
10.0000 mg | ORAL_TABLET | Freq: Once | ORAL | Status: AC
  Administered 2022-09-25: 10 mg via ORAL
  Filled 2022-09-25: qty 1

## 2022-09-25 MED ORDER — HEPARIN SOD (PORK) LOCK FLUSH 100 UNIT/ML IV SOLN
500.0000 [IU] | Freq: Once | INTRAVENOUS | Status: AC | PRN
  Administered 2022-09-25: 500 [IU]

## 2022-09-25 MED ORDER — SODIUM CHLORIDE 0.9 % IV SOLN: Freq: Once | INTRAVENOUS | Status: AC

## 2022-09-25 MED ORDER — SODIUM CHLORIDE 0.9% FLUSH
10.0000 mL | Freq: Once | INTRAVENOUS | Status: AC
  Administered 2022-09-25: 10 mL

## 2022-09-25 NOTE — Patient Instructions (Signed)
[facility]  Discharge Instructions: Thank you for choosing [facility] to provide your oncology and hematology care.   If you have a lab appointment with the Cancer Center, please go directly to the Cancer Center and check in at the registration area.   Wear comfortable clothing and clothing appropriate for easy access to any Portacath or PICC line.   We strive to give you quality time with your provider. You may need to reschedule your appointment if you arrive late (15 or more minutes).  Arriving late affects you and other patients whose appointments are after yours.  Also, if you miss three or more appointments without notifying the office, you may be dismissed from the clinic at the provider's discretion.      For prescription refill requests, have your pharmacy contact our office and allow 72 hours for refills to be completed.    Today you received the following chemotherapy and/or immunotherapy agents: Keytruda, Alimta.       To help prevent nausea and vomiting after your treatment, we encourage you to take your nausea medication as directed.  BELOW ARE SYMPTOMS THAT SHOULD BE REPORTED IMMEDIATELY: *FEVER GREATER THAN 100.4 F (38 C) OR HIGHER *CHILLS OR SWEATING *NAUSEA AND VOMITING THAT IS NOT CONTROLLED WITH YOUR NAUSEA MEDICATION *UNUSUAL SHORTNESS OF BREATH *UNUSUAL BRUISING OR BLEEDING *URINARY PROBLEMS (pain or burning when urinating, or frequent urination) *BOWEL PROBLEMS (unusual diarrhea, constipation, pain near the anus) TENDERNESS IN MOUTH AND THROAT WITH OR WITHOUT PRESENCE OF ULCERS (sore throat, sores in mouth, or a toothache) UNUSUAL RASH, SWELLING OR PAIN  UNUSUAL VAGINAL DISCHARGE OR ITCHING   Items with * indicate a potential emergency and should be followed up as soon as possible or go to the Emergency Department if any problems should occur.  Please show the CHEMOTHERAPY ALERT CARD or IMMUNOTHERAPY ALERT CARD  at check-in to the Emergency Department and triage nurse.  Should you have questions after your visit or need to cancel or reschedule your appointment, please contact [facility]  Dept: [phone]  and follow the prompts.  Office hours are 8:00 a.m. to 4:30 p.m. Monday - Friday. Please note that voicemails left after 4:00 p.m. may not be returned until the following business day.  We are closed weekends and major holidays. You have access to a nurse at all times for urgent questions. Please call the main number to the clinic Dept: [phone] and follow the prompts.   For any non-urgent questions, you may also contact your provider using MyChart. We now offer e-Visits for anyone 18 and older to request care online for non-urgent symptoms. For details visit [email or web address].   Also download the MyChart app! Go to the app store, search "MyChart", open the app, select [facility], and log in with your MyChart username and password.   

## 2022-09-25 NOTE — Progress Notes (Signed)
Per Cassie PA-C ok to tx with Scr 1.64.

## 2022-10-09 DIAGNOSIS — D110 Benign neoplasm of parotid gland: Secondary | ICD-10-CM | POA: Diagnosis not present

## 2022-10-13 ENCOUNTER — Ambulatory Visit (HOSPITAL_COMMUNITY)
Admission: RE | Admit: 2022-10-13 | Discharge: 2022-10-13 | Disposition: A | Discharge status: Home / Self Care | Payer: Medicare Other | Source: Ambulatory Visit | Attending: Physician Assistant | Admitting: Physician Assistant

## 2022-10-13 DIAGNOSIS — C3491 Malignant neoplasm of unspecified part of right bronchus or lung: Secondary | ICD-10-CM | POA: Diagnosis not present

## 2022-10-13 DIAGNOSIS — J9811 Atelectasis: Secondary | ICD-10-CM | POA: Diagnosis not present

## 2022-10-13 DIAGNOSIS — C3411 Malignant neoplasm of upper lobe, right bronchus or lung: Secondary | ICD-10-CM | POA: Diagnosis not present

## 2022-10-13 DIAGNOSIS — J432 Centrilobular emphysema: Secondary | ICD-10-CM | POA: Diagnosis not present

## 2022-10-13 DIAGNOSIS — K3189 Other diseases of stomach and duodenum: Secondary | ICD-10-CM | POA: Diagnosis not present

## 2022-10-13 DIAGNOSIS — J90 Pleural effusion, not elsewhere classified: Secondary | ICD-10-CM | POA: Diagnosis not present

## 2022-10-14 ENCOUNTER — Other Ambulatory Visit: Payer: Medicare Other

## 2022-10-14 ENCOUNTER — Ambulatory Visit: Payer: Medicare Other | Admitting: Internal Medicine

## 2022-10-14 ENCOUNTER — Ambulatory Visit: Payer: Medicare Other

## 2022-10-16 ENCOUNTER — Encounter: Payer: Self-pay | Admitting: Medical Oncology

## 2022-10-16 ENCOUNTER — Inpatient Hospital Stay: Payer: Medicare Other

## 2022-10-16 ENCOUNTER — Inpatient Hospital Stay (HOSPITAL_BASED_OUTPATIENT_CLINIC_OR_DEPARTMENT_OTHER): Payer: Medicare Other | Admitting: Internal Medicine

## 2022-10-16 ENCOUNTER — Inpatient Hospital Stay: Payer: Medicare Other | Attending: Internal Medicine

## 2022-10-16 ENCOUNTER — Other Ambulatory Visit: Payer: Self-pay

## 2022-10-16 ENCOUNTER — Encounter: Payer: Self-pay | Admitting: Internal Medicine

## 2022-10-16 VITALS — BP 107/62 | HR 95 | Temp 97.2°F | Resp 16 | Wt 171.3 lb

## 2022-10-16 DIAGNOSIS — D649 Anemia, unspecified: Secondary | ICD-10-CM | POA: Diagnosis not present

## 2022-10-16 DIAGNOSIS — J90 Pleural effusion, not elsewhere classified: Secondary | ICD-10-CM | POA: Diagnosis not present

## 2022-10-16 DIAGNOSIS — N289 Disorder of kidney and ureter, unspecified: Secondary | ICD-10-CM | POA: Insufficient documentation

## 2022-10-16 DIAGNOSIS — Z95828 Presence of other vascular implants and grafts: Secondary | ICD-10-CM

## 2022-10-16 DIAGNOSIS — R188 Other ascites: Secondary | ICD-10-CM | POA: Insufficient documentation

## 2022-10-16 DIAGNOSIS — Z79899 Other long term (current) drug therapy: Secondary | ICD-10-CM | POA: Diagnosis not present

## 2022-10-16 DIAGNOSIS — C3490 Malignant neoplasm of unspecified part of unspecified bronchus or lung: Secondary | ICD-10-CM | POA: Diagnosis not present

## 2022-10-16 DIAGNOSIS — C3491 Malignant neoplasm of unspecified part of right bronchus or lung: Secondary | ICD-10-CM

## 2022-10-16 DIAGNOSIS — C7931 Secondary malignant neoplasm of brain: Secondary | ICD-10-CM | POA: Diagnosis not present

## 2022-10-16 DIAGNOSIS — C3431 Malignant neoplasm of lower lobe, right bronchus or lung: Secondary | ICD-10-CM | POA: Diagnosis not present

## 2022-10-16 DIAGNOSIS — C3411 Malignant neoplasm of upper lobe, right bronchus or lung: Secondary | ICD-10-CM | POA: Insufficient documentation

## 2022-10-16 LAB — CBC WITH DIFFERENTIAL (CANCER CENTER ONLY)
Abs Immature Granulocytes: 0.01 10*3/uL (ref 0.00–0.07)
Basophils Absolute: 0 10*3/uL (ref 0.0–0.1)
Basophils Relative: 1 %
Eosinophils Absolute: 0.1 10*3/uL (ref 0.0–0.5)
Eosinophils Relative: 3 %
HCT: 28.7 % — ABNORMAL LOW (ref 39.0–52.0)
Hemoglobin: 9.6 g/dL — ABNORMAL LOW (ref 13.0–17.0)
Immature Granulocytes: 0 %
Lymphocytes Relative: 43 %
Lymphs Abs: 1.6 10*3/uL (ref 0.7–4.0)
MCH: 29.2 pg (ref 26.0–34.0)
MCHC: 33.4 g/dL (ref 30.0–36.0)
MCV: 87.2 fL (ref 80.0–100.0)
Monocytes Absolute: 0.6 10*3/uL (ref 0.1–1.0)
Monocytes Relative: 16 %
Neutro Abs: 1.3 10*3/uL — ABNORMAL LOW (ref 1.7–7.7)
Neutrophils Relative %: 37 %
Platelet Count: 242 10*3/uL (ref 150–400)
RBC: 3.29 MIL/uL — ABNORMAL LOW (ref 4.22–5.81)
RDW: 18.5 % — ABNORMAL HIGH (ref 11.5–15.5)
WBC Count: 3.6 10*3/uL — ABNORMAL LOW (ref 4.0–10.5)
nRBC: 0 % (ref 0.0–0.2)

## 2022-10-16 LAB — CMP (CANCER CENTER ONLY)
ALT: 15 U/L (ref 0–44)
AST: 24 U/L (ref 15–41)
Albumin: 3.2 g/dL — ABNORMAL LOW (ref 3.5–5.0)
Alkaline Phosphatase: 44 U/L (ref 38–126)
Anion gap: 6 (ref 5–15)
BUN: 14 mg/dL (ref 8–23)
CO2: 25 mmol/L (ref 22–32)
Calcium: 8.4 mg/dL — ABNORMAL LOW (ref 8.9–10.3)
Chloride: 108 mmol/L (ref 98–111)
Creatinine: 1.49 mg/dL — ABNORMAL HIGH (ref 0.61–1.24)
GFR, Estimated: 52 mL/min — ABNORMAL LOW (ref >60)
Glucose, Bld: 146 mg/dL — ABNORMAL HIGH (ref 70–99)
Potassium: 4 mmol/L (ref 3.5–5.1)
Sodium: 139 mmol/L (ref 135–145)
Total Bilirubin: 0.2 mg/dL — ABNORMAL LOW (ref 0.3–1.2)
Total Protein: 6.6 g/dL (ref 6.5–8.1)

## 2022-10-16 MED ORDER — HEPARIN SOD (PORK) LOCK FLUSH 100 UNIT/ML IV SOLN
500.0000 [IU] | Freq: Once | INTRAVENOUS | Status: AC
  Administered 2022-10-16: 500 [IU]

## 2022-10-16 MED ORDER — SODIUM CHLORIDE 0.9% FLUSH
10.0000 mL | Freq: Once | INTRAVENOUS | Status: AC
  Administered 2022-10-16: 10 mL

## 2022-10-16 NOTE — Progress Notes (Signed)
Pt did not receive treatment today per provider, port-a-cath de-accessed by Diane RN

## 2022-10-16 NOTE — Progress Notes (Signed)
Treatment stopped today .

## 2022-10-16 NOTE — Progress Notes (Signed)
[facility] [phone]   [phone]  OFFICE PROGRESS NOTE  Corwin Levins, MD [address] [place] [address]  DIAGNOSIS: Stage IV (T3, N2, M1c) non-small cell lung cancer favoring adenocarcinoma presented with right upper lobe lung mass in addition to right lower lobe, left lower lobe in addition to right hilar and mediastinal lymphadenopathy in addition to metastatic brain lesions diagnosed in October 2021.  Molecular studies by [employer]:  [id number], 9.5%,   PRIOR THERAPY:  1) SRS to 3 brain lesion under the care of Dr. Mitzi Hansen. 2) Systemic chemotherapy with carboplatin for AUC of 5, Alimta 500 mg/M2 and Keytruda 200 mg IV every 3 weeks.  First dose April 24, 2020.  Status post 43 cycles.  Starting from cycle #5 the patient will be on maintenance treatment with Alimta and Keytruda every 3 weeks.  Last dose  was given on 09/23/2022 discontinued secondary to renal insufficiency and worsening edema  CURRENT THERAPY: Observation.  INTERVAL HISTORY: Nicolas Allen 67 y.o. male returns to the clinic today for follow-up visit accompanied by his wife.  The patient is feeling fine today with no concerning complaints except for fatigue and swelling of the lower extremities.  He has 1 episode of syncope but he felt better after eating a piece of chocolate and having some drinks.  He denied having any current chest pain but has shortness of breath with exertion with no cough or hemoptysis.  He has no nausea, vomiting, diarrhea or constipation.  He has no headache or visual changes.  He denied having any fever or chills.  He has been tolerating his treatment with maintenance Alimta and Keytruda well except for the worsening edema and renal insufficiency.  He had repeat CT scan of the chest, abdomen and pelvis performed recently and he is here for evaluation and discussion of his scan results.  MEDICAL HISTORY: Past Medical History:  Diagnosis Date    Anemia 06/24/2012   ANXIETY 02/03/2007   Cervical disc disease 02/12/2012   S/p surgury jan 2013   Erectile dysfunction 02/11/2011   GERD (gastroesophageal reflux disease)    GLUCOSE INTOLERANCE 01/04/2008   HYPERLIPIDEMIA 02/03/2007   HYPERTENSION 02/03/2007   IBS (irritable bowel syndrome)    Impaired glucose tolerance 02/11/2011   nscl ca 03/30/2020   Smoker 08/22/2014   Substance abuse (HCC) 2001   sober for 16 yrs    ALLERGIES:  has No Known Allergies.  MEDICATIONS:  Current Outpatient Medications  Medication Sig Dispense Refill   amLODipine-benazepril (LOTREL) 10-20 MG capsule Take 1 capsule by mouth once daily 90 capsule 3   aspirin 325 MG EC tablet Take 1 tablet (325 mg total) by mouth daily. 90 tablet 99   azithromycin (ZITHROMAX Z-PAK) 250 MG tablet Use as directed 6 each 0   b complex vitamins tablet Take 1 tablet by mouth daily.     cholecalciferol (VITAMIN D3) 25 MCG (1000 UNIT) tablet Take 1,000 Units by mouth daily.     folic acid (FOLVITE) 1 MG tablet Take 1 tablet by mouth once daily 90 tablet 3   Garlic 1000 MG CAPS Take 1,000 mg by mouth daily.      lidocaine-prilocaine (EMLA) cream Apply to the Port-A-Cath site 30-60 minutes before chemotherapy. 30 g 0   Omega-3 Fatty Acids (FISH OIL PO) Take 1 capsule by mouth daily.      polyethylene glycol (MIRALAX / GLYCOLAX) 17 g packet Take 17 g by  mouth daily as needed.     prochlorperazine (COMPAZINE) 10 MG tablet Take 1 tablet (10 mg total) by mouth every 6 (six) hours as needed for nausea or vomiting. 30 tablet 0   rosuvastatin (CRESTOR) 40 MG tablet Take 1 tablet (40 mg total) by mouth daily. 90 tablet 3   vardenafil (LEVITRA) 20 MG tablet TAKE 1 TABLET BY MOUTH AS NEEDED FOR  ERECTILE  DYSFUNCTION 10 tablet 5   No current facility-administered medications for this visit.    SURGICAL HISTORY:  Past Surgical History:  Procedure Laterality Date   BRONCHIAL BIOPSY  03/30/2020   Procedure: BRONCHIAL BIOPSIES;   Surgeon: Josephine Igo, DO;  Location: [facility];  Service: Pulmonary;;   BRONCHIAL BRUSHINGS  03/30/2020   Procedure: BRONCHIAL BRUSHINGS;  Surgeon: Josephine Igo, DO;  Location: [facility];  Service: Pulmonary;;   BRONCHIAL NEEDLE ASPIRATION BIOPSY  03/30/2020   Procedure: BRONCHIAL NEEDLE ASPIRATION BIOPSIES;  Surgeon: Josephine Igo, DO;  Location: [facility];  Service: Pulmonary;;   BRONCHIAL WASHINGS  03/30/2020   Procedure: BRONCHIAL WASHINGS;  Surgeon: Josephine Igo, DO;  Location: [facility];  Service: Pulmonary;;   COLONOSCOPY  2007   IR IMAGING GUIDED PORT INSERTION  04/23/2020   neck fusion  2012   C4   NO PAST SURGERIES     VIDEO BRONCHOSCOPY WITH ENDOBRONCHIAL NAVIGATION N/A 03/30/2020   Procedure: VIDEO BRONCHOSCOPY WITH ENDOBRONCHIAL NAVIGATION;  Surgeon: Josephine Igo, DO;  Location: [facility];  Service: Pulmonary;  Laterality: N/A;   VIDEO BRONCHOSCOPY WITH ENDOBRONCHIAL ULTRASOUND N/A 03/30/2020   Procedure: VIDEO BRONCHOSCOPY WITH ENDOBRONCHIAL ULTRASOUND;  Surgeon: Josephine Igo, DO;  Location: [facility];  Service: Pulmonary;  Laterality: N/A;    REVIEW OF SYSTEMS:  Constitutional: positive for fatigue Eyes: negative Ears, nose, mouth, throat, and face: negative Respiratory: positive for dyspnea on exertion Cardiovascular: negative Gastrointestinal: negative Genitourinary:negative Integument/breast: negative Hematologic/lymphatic: negative Musculoskeletal:negative Neurological: negative Behavioral/Psych: negative Endocrine: negative Allergic/Immunologic: negative   PHYSICAL EXAMINATION: General appearance: alert, cooperative, fatigued, and no distress Head: Normocephalic, without obvious abnormality, atraumatic Neck: no adenopathy, no JVD, supple, symmetrical, trachea midline, and thyroid not enlarged, symmetric, no tenderness/mass/nodules Lymph nodes: Cervical, supraclavicular, and axillary nodes normal. Resp: clear to  auscultation bilaterally Back: symmetric, no curvature. ROM normal. No CVA tenderness. Cardio: regular rate and rhythm, S1, S2 normal, no murmur, click, rub or gallop GI: soft, non-tender; bowel sounds normal; no masses,  no organomegaly Extremities: extremities normal, atraumatic, no cyanosis or edema Neurologic: Alert and oriented X 3, normal strength and tone. Normal symmetric reflexes. Normal coordination and gait  ECOG PERFORMANCE STATUS: 1 - Symptomatic but completely ambulatory  Blood pressure 107/62, pulse 95, temperature (!) 97.2 F (36.2 C), temperature source Temporal, resp. rate 16, weight 171 lb 4.8 oz (77.7 kg), SpO2 100 %.  LABORATORY DATA: Lab Results  Component Value Date   WBC 3.6 (L) 10/16/2022   HGB 9.6 (L) 10/16/2022   HCT 28.7 (L) 10/16/2022   MCV 87.2 10/16/2022   PLT 242 10/16/2022      Chemistry      Component Value Date/Time   NA 137 09/25/2022 1033   K 3.3 (L) 09/25/2022 1033   CL 106 09/25/2022 1033   CO2 22 09/25/2022 1033   BUN 19 09/25/2022 1033   CREATININE 1.64 (H) 09/25/2022 1033   CREATININE 0.88 02/21/2020 1115      Component Value Date/Time   CALCIUM 8.6 (L) 09/25/2022 1033   ALKPHOS 51 09/25/2022 1033  AST 20 09/25/2022 1033   ALT 12 09/25/2022 1033   BILITOT 0.2 (L) 09/25/2022 1033       RADIOGRAPHIC STUDIES: CT CHEST ABDOMEN PELVIS WO CONTRAST  Addendum Date: 10/15/2022   ADDENDUM REPORT: 10/15/2022 18:01 ADDENDUM: In the musculoskeletal section of the chest please add the following comment Multiple subcutaneous nodules along the anterior chest wall inferiorly along the left side consistent with multiple presumed sebaceous cysts. These are unchanged from previous. There is also some stable macroscopic fat along the left axillary region. Electronically Signed   By: Karen Kays M.D.   On: 10/15/2022 18:01   Result Date: 10/15/2022 CLINICAL DATA:  Non-small-cell right-sided lung cancer. Stage IV. Metastatic disease evaluation. *  Tracking Code: BO * EXAM: CT CHEST, ABDOMEN AND PELVIS WITHOUT CONTRAST TECHNIQUE: Multidetector CT imaging of the chest, abdomen and pelvis was performed following the standard protocol without IV contrast. RADIATION DOSE REDUCTION: This exam was performed according to the departmental dose-optimization program which includes automated exposure control, adjustment of the mA and/or kV according to patient size and/or use of iterative reconstruction technique. COMPARISON:  CT 07/18/2022 and older FINDINGS: CT CHEST FINDINGS Cardiovascular: Right upper chest port. Moderate pericardial effusion. The heart is nonenlarged. On this non IV contrast exam, the thoracic aorta has a normal course and caliber. Mild scattered vascular calcifications. Mediastinum/Nodes: On this non IV contrast exam there is no specific abnormal lymph node enlargement identified in the axillary regions, hilum or mediastinum. Small subcarinal nodes are again identified. Mildly patulous thoracic esophagus. Lungs/Pleura: Moderate bilateral pleural effusions are identified, right-greater-than-left. Slightly increased compared to the previous. There is some adjacent dependent atelectasis. Centrilobular emphysematous changes are identified. There are some paraseptal changes of the apices as well. No pneumothorax. Multiple lung nodules are once again identified and specific lesions will be followed for continuity. Posterior right upper lobe nodule which previously measured 10 mm, today on series 6, image 33 measures 11 mm, not significantly changed when adjusting for technique. The right perihilar lesion in the superior segment of the right lower lobe medially on the prior examination measured 20 by 14 mm and today series 6, image 70 this semi-solid lesion measures 2.1 by 1.1 cm. Mixed attenuation lesion right lower lobe laterally on the prior measured 2.4 by 1.5 cm and today when measured in the same fashion on series 6, image 96 measures 2.4 x 1.5 cm.  The 4 mm middle lobe nodule on the previous, today on series 6, image 93 measures 5 mm. This lesion appears slightly larger. The mixed attenuation lesion left lower lobe which measured 3.3 x 2.8 cm on the prior, today when measured in the same fashion measures 3.7 x 2.5 cm. This has a more cavitary appearance. Unchanged. No new dominant lung nodule today. Musculoskeletal: Mild degenerative changes seen along the spine. CT ABDOMEN PELVIS FINDINGS Hepatobiliary: On this non IV contrast exam, grossly the liver is unremarkable. Gallbladder is present. Pancreas: Unremarkable. No pancreatic ductal dilatation or surrounding inflammatory changes. Spleen: Normal in size without focal abnormality. Adrenals/Urinary Tract: The adrenal glands are preserved. No abnormal calcification seen within either kidney nor along the course of either ureter. Diffuse wall thickening of the urinary bladder is stable. Stomach/Bowel: On this non oral contrast exam the large bowel has a normal course and caliber with moderate diffuse colonic stool. Significant stool in the rectum. The questionable wall thickening near the splenic flexure and proximal descending colon could relate to underdistention but is more prominent than previous. Normal retrocecal appendix.  There is increasing mesenteric stranding compared to previous however as well as some increasing mild ascites. Small bowel and stomach are nondilated. Again some question mild wall thickening along loops of small bowel in the left midabdomen. Vascular/Lymphatic: Normal caliber aorta and IVC with scattered vascular calcifications. Significant calcifications seen in the area of the iliac vessels. Reproductive: Prostate is unremarkable. Other: Increasing anasarca, mesenteric stranding and ascites. Musculoskeletal: Degenerative changes seen of the spine and pelvis. Bridging osteophytes along the sacroiliac joints. IMPRESSION: Slightly increased moderate bilateral pleural effusions with  adjacent opacities. Multiple ill-defined lung nodules bilaterally are similar to previous examination. There is one small nodule in the middle lobe which appears slightly larger today now measuring 5 mm. Continued attention on follow-up in 3 months. Increasing mesenteric stranding, edema, anasarca and ascites. There are several loops of nondilated small and large bowel which has some mild wall thickening, please correlate with any particular symptoms. This could relate to the level of ascites and edema. No developing new mass lesion, lymph node enlargement in the chest, abdomen or pelvis. Electronically Signed: By: Karen Kays M.D. On: 10/15/2022 16:50     ASSESSMENT AND PLAN: This is a very pleasant 67 years old African-American male recently diagnosed with stage IV (T3, N2, M1c)  non-small cell lung cancer, adenocarcinoma presented with right upper lobe lung mass in addition to right lower lobe, left lower lobe in addition to right hilar and mediastinal lymphadenopathy in addition to metastatic brain lesions diagnosed in October 2021. The patient has molecular studies by [employer] that showed no actionable mutations. He underwent SRS treatment to his brain lesion. The patient underwent systemic chemotherapy with carboplatin for AUC of 5, Alimta 500 mg/M2 and Keytruda 200 mg IV every 3 weeks status post 43 cycles.  Starting from cycle #5 the patient is on maintenance treatment with Alimta and Keytruda every 3 weeks. The patient has been tolerating this treatment well except for the worsening anemia as well as renal insufficiency and generalized edema. He had repeat CT scan of the chest, abdomen and pelvis performed recently.  I personally and independently reviewed the scan and discussed the result with the patient and his wife. His scan showed increased moderate bilateral pleural effusion as well as increased mesenteric stranding, edema and anasarca as well as ascites secondary to his renal  insufficiency from the treatment.  He has no clear evidence for disease progression of his lung cancer. I recommended for the patient to stop his treatment with maintenance Alimta and Keytruda at this point because of the renal insufficiency, worsening anemia and edema. I will see him back for follow-up visit in 3 months for evaluation and repeat CT scan of the chest, abdomen and pelvis for restaging of his disease. The patient was advised to call immediately if he has any other concerning symptoms in the interval. The patient voices understanding of current disease status and treatment options and is in agreement with the current care plan.  All questions were answered. The patient knows to call the clinic with any problems, questions or concerns. We can certainly see the patient much sooner if necessary.  Disclaimer: This note was dictated with voice recognition software. Similar sounding words can inadvertently be transcribed and may not be corrected upon review.

## 2022-10-17 ENCOUNTER — Telehealth: Payer: Self-pay | Admitting: Internal Medicine

## 2022-10-20 ENCOUNTER — Telehealth: Payer: Self-pay | Admitting: Internal Medicine

## 2022-10-20 NOTE — Telephone Encounter (Signed)
Prescription Request  10/20/2022  LOV: 08/28/2022  What is the name of the medication or equipment? amLODipine-benazepril (LOTREL) 10-20 MG capsule   Have you contacted your pharmacy to request a refill? No   Which pharmacy would you like this sent to?  [id number] - [place] ([place]), [place] - [address] [place] [address] Phone: [phone] Fax: [phone]    Patient notified that their request is being sent to the clinical staff for review and that they should receive a response within 2 business days.   Please advise at Mobile [phone] (mobile)    Patient would like a 90 day refill.

## 2022-11-04 ENCOUNTER — Ambulatory Visit: Payer: Medicare Other | Admitting: Internal Medicine

## 2022-11-04 ENCOUNTER — Ambulatory Visit: Payer: Medicare Other

## 2022-11-04 ENCOUNTER — Other Ambulatory Visit: Payer: Medicare Other

## 2022-11-06 ENCOUNTER — Other Ambulatory Visit: Payer: Medicare Other

## 2022-11-06 ENCOUNTER — Ambulatory Visit: Payer: Medicare Other | Admitting: Internal Medicine

## 2022-11-06 ENCOUNTER — Ambulatory Visit: Payer: Medicare Other

## 2022-11-10 ENCOUNTER — Other Ambulatory Visit: Payer: Self-pay | Admitting: Internal Medicine

## 2022-11-11 ENCOUNTER — Ambulatory Visit
Admission: RE | Admit: 2022-11-11 | Discharge: 2022-11-11 | Disposition: A | Discharge status: Home / Self Care | Payer: Medicare Other | Source: Ambulatory Visit | Attending: Radiation Oncology | Admitting: Radiation Oncology

## 2022-11-11 DIAGNOSIS — C7931 Secondary malignant neoplasm of brain: Secondary | ICD-10-CM | POA: Diagnosis not present

## 2022-11-11 MED ORDER — HEPARIN SOD (PORK) LOCK FLUSH 100 UNIT/ML IV SOLN
500.0000 [IU] | Freq: Once | INTRAVENOUS | Status: AC
  Administered 2022-11-11: 500 [IU] via INTRAVENOUS

## 2022-11-11 MED ORDER — GADOPICLENOL 0.5 MMOL/ML IV SOLN
7.5000 mL | Freq: Once | INTRAVENOUS | Status: AC | PRN
  Administered 2022-11-11: 7.5 mL via INTRAVENOUS

## 2022-11-11 MED ORDER — SODIUM CHLORIDE 0.9% FLUSH
10.0000 mL | INTRAVENOUS | Status: DC | PRN
  Administered 2022-11-11: 10 mL via INTRAVENOUS

## 2022-11-16 NOTE — Progress Notes (Signed)
Radiation Oncology         [phone] ________________________________  Outpatient Follow Up - Conducted via telephone at patient request.  I spoke with the patient to conduct this visit via telephone. The patient was notified in advance and was offered an in person or telemedicine meeting to allow for face to face communication but instead preferred to proceed with a telephone visit.    Name: Nicolas Allen        MRN: [id number]  Date of Service: 11/18/2022 DOB: Jun 15, 1956  WU:[id number], Len Blalock, MD  Corwin Levins, MD     REFERRING PHYSICIAN: Corwin Levins, MD   DIAGNOSIS: The primary encounter diagnosis was Malignant neoplasm metastatic to brain [facility]). A diagnosis of Non-small cell carcinoma of right lung, stage 4 (HCC) was also pertinent to this visit.   HISTORY OF PRESENT ILLNESS: Nicolas Allen is a 67 y.o. male with a history of stage IV non-small cell lung cancer, adenocarcinoma involving multifocal disease in the lung and 3 brain metastases. He was diagnosed in the fall of 2021 and found to have metastatic disease in the lung and brain for which he has received stereotactic radiosurgery [facility]) with Dr. Mitzi Hansen.  While he did have pseudoprogression due to his immunotherapy, there was growth in the same mid cerebellar lesion and after discussion in brain oncology conference, it was felt he should pursue re-irradiation which was completed with [facility]  on 02/12/22. He has recently completed consolidative chemo/immunotherapy on 09/25/22 with Dr. Arbutus Ped.  His most recent MRI on 11/11/22 showed increase in the edema around the lesion in the cerebellar vermis. The area in total measures 1.9 cm, previously 1.4 cm. No new disease is noted. Today discussion in brain oncology conference was that the impression is that the lesion continues to be a pretreated site without concern for active disease, but inflammatory change from recent treatment and immunotherapy. Recommendations were to continue close  follow-up and surveillance of this but to have a low threshold if he became symptomatic to consider steroids or avastin.  He's contacted today to review these results.    PREVIOUS RADIATION THERAPY:  02/12/2022 through 02/12/2022 [facility] Treatment Site Technique Total Dose (Gy) Dose per Fx (Gy) Completed Fx Beam Energies  Brain:  [id number] Mid Cerebellum IMRT 20/20 20 1/1 6XFFF    04/20/20 [facility] Treatment: Each site below was treated to 20 Gy in 1 fraction PTV1 Rt Cerebellum 4mm PTV2Mid cerebellum 11mm PTV3Lt Parietal 6mm    PAST MEDICAL HISTORY:  Past Medical History:  Diagnosis Date   Anemia 06/24/2012   ANXIETY 02/03/2007   Cervical disc disease 02/12/2012   S/p surgury jan 2013   Erectile dysfunction 02/11/2011   GERD (gastroesophageal reflux disease)    GLUCOSE INTOLERANCE 01/04/2008   HYPERLIPIDEMIA 02/03/2007   HYPERTENSION 02/03/2007   IBS (irritable bowel syndrome)    Impaired glucose tolerance 02/11/2011   nscl ca 03/30/2020   Smoker 08/22/2014   Substance abuse (HCC) 2001   sober for 16 yrs       PAST SURGICAL HISTORY: Past Surgical History:  Procedure Laterality Date   BRONCHIAL BIOPSY  03/30/2020   Procedure: BRONCHIAL BIOPSIES;  Surgeon: Josephine Igo, DO;  Location: [facility];  Service: Pulmonary;;   BRONCHIAL BRUSHINGS  03/30/2020   Procedure: BRONCHIAL BRUSHINGS;  Surgeon: Josephine Igo, DO;  Location: [facility];  Service: Pulmonary;;   BRONCHIAL NEEDLE ASPIRATION BIOPSY  03/30/2020   Procedure: BRONCHIAL NEEDLE ASPIRATION BIOPSIES;  Surgeon: Josephine Igo, DO;  Location: [facility];  Service: Pulmonary;;   BRONCHIAL WASHINGS  03/30/2020   Procedure: BRONCHIAL WASHINGS;  Surgeon: Josephine Igo, DO;  Location: [facility];  Service: Pulmonary;;   COLONOSCOPY  2007   IR IMAGING GUIDED PORT INSERTION  04/23/2020   neck fusion  2012   C4   NO PAST SURGERIES     VIDEO BRONCHOSCOPY WITH ENDOBRONCHIAL NAVIGATION N/A 03/30/2020    Procedure: VIDEO BRONCHOSCOPY WITH ENDOBRONCHIAL NAVIGATION;  Surgeon: Josephine Igo, DO;  Location: [facility];  Service: Pulmonary;  Laterality: N/A;   VIDEO BRONCHOSCOPY WITH ENDOBRONCHIAL ULTRASOUND N/A 03/30/2020   Procedure: VIDEO BRONCHOSCOPY WITH ENDOBRONCHIAL ULTRASOUND;  Surgeon: Josephine Igo, DO;  Location: [facility];  Service: Pulmonary;  Laterality: N/A;     FAMILY HISTORY:  Family History  Problem Relation Age of Onset   Cancer Mother        esophagus   Cancer Cousin    Stroke Other    Hypertension Other    Colon cancer Neg Hx      SOCIAL HISTORY:  reports that he quit smoking about 2 years ago. His smoking use included cigarettes. He has a 45.00 pack-year smoking history. He has quit using smokeless tobacco.  His smokeless tobacco use included chew. He reports that he does not drink alcohol and does not use drugs. The patient is married and resides in [place]. 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    ALLERGIES: Patient has no known allergies.   MEDICATIONS:  Current Outpatient Medications  Medication Sig Dispense Refill   amLODipine-benazepril (LOTREL) 10-20 MG capsule Take 1 capsule by mouth once daily 90 capsule 3   aspirin 325 MG EC tablet Take 1 tablet (325 mg total) by mouth daily. 90 tablet 99   b complex vitamins tablet Take 1 tablet by mouth daily.     cholecalciferol (VITAMIN D3) 25 MCG (1000 UNIT) tablet Take 1,000 Units by mouth daily.     folic acid (FOLVITE) 1 MG tablet Take 1 tablet by mouth once daily 90 tablet 3   Garlic 1000 MG CAPS Take 1,000 mg by mouth daily.      lidocaine-prilocaine (EMLA) cream Apply to the Port-A-Cath site 30-60 minutes before chemotherapy. 30 g 0   Omega-3 Fatty Acids (FISH OIL PO) Take 1 capsule by mouth daily.      polyethylene glycol (MIRALAX / GLYCOLAX) 17 g packet Take 17 g by mouth daily as needed.      prochlorperazine (COMPAZINE) 10 MG tablet Take 1 tablet (10 mg total) by mouth every 6 (six) hours as needed for nausea or vomiting. 30 tablet 0   rosuvastatin (CRESTOR) 40 MG tablet Take 1 tablet (40 mg total) by mouth daily. 90 tablet 3   vardenafil (LEVITRA) 20 MG tablet TAKE 1 TABLET BY MOUTH AS NEEDED FOR  ERECTILE  DYSFUNCTION 10 tablet 5   No current facility-administered medications for this visit.     REVIEW OF SYSTEMS: On review of systems, the patient reports he is doing well overall. He does have fatigue and is hoping this will improve soon since he's now finished systemic therapy. He reports occasional shortness of breath but no chest pain. He is not having any headaches, vertigo, gait imbalance, or dizziness. He is not having any nausea. No other complaints are verbalized.   PHYSICAL EXAM:  Unable to assess given encounter type.    ECOG = 0  0 - Asymptomatic (Fully active, able to carry on all predisease activities without restriction)  1 - Symptomatic but completely ambulatory (Restricted in physically strenuous activity but ambulatory and able to carry out work of a light or sedentary nature. For example, light housework, office work)  2 - Symptomatic, <50% in bed during the day (Ambulatory and capable of all self care but unable to carry out any work activities. Up and about more than 50% of waking hours)  3 - Symptomatic, >50% in bed, but not bedbound (Capable of only limited self-care, confined to bed or chair 50% or more of waking hours)  4 - Bedbound (Completely disabled. Cannot carry on any self-care. Totally confined to bed or chair)  5 - Death   Santiago Glad MM, Creech RH, Tormey DC, et al. [phone]). "Toxicity and response criteria of the [facility]". Am. Evlyn Clines. Oncol. 5 (6): 649-55    LABORATORY DATA:  Lab Results  Component Value Date   WBC 3.6 (L) 10/16/2022   HGB 9.6 (L) 10/16/2022   HCT 28.7 (L) 10/16/2022   MCV 87.2 10/16/2022   PLT  242 10/16/2022   Lab Results  Component Value Date   NA 139 10/16/2022   K 4.0 10/16/2022   CL 108 10/16/2022   CO2 25 10/16/2022   Lab Results  Component Value Date   ALT 15 10/16/2022   AST 24 10/16/2022   ALKPHOS 44 10/16/2022   BILITOT 0.2 (L) 10/16/2022      RADIOGRAPHY: No results found.      IMPRESSION/PLAN: 1. Stage IV non-small cell lung cancer, adenocarcinoma involving multifocal disease in the lung and brain metastases. The patient is doing well clinically. While he has changes in in the area of prior therapy of radionecrosis/immunotherapy influence, he is asymptomatic. He is aware that if he developed cerebellar symptoms, and we reviewed examples of these, he should contact us. Otherwise, since he's asymptomatic, we will follow up with repeat MRI in 3 months of the brain. He is in agreement with this plan.  He will continue with Dr. Arbutus Ped as well.  2. Right parotid pleomorphic adenoma. He did have biopsy in 2023, and the findings were benign but have the risk of malignant transformation. He is followed in ENT by Dr. Marene Lenz on an annual basis.     This encounter was conducted via telephone.  The patient has provided two factor identification and has given verbal consent for this type of encounter and has been advised to only accept a meeting of this type in a secure network environment. The time spent during this encounter was 30 minutes including preparation, discussion, and coordination of the patient's care. The attendants for this meeting include  Ronny Bacon  and Earl Lites.  During the encounter,  Ronny Bacon were located at [facility] Radiation Oncology Department.  Nicolas Allen was located at home.       Osker Mason, PAC

## 2022-11-18 ENCOUNTER — Ambulatory Visit
Admission: RE | Admit: 2022-11-18 | Discharge: 2022-11-18 | Disposition: A | Discharge status: Home / Self Care | Payer: Medicare Other | Source: Ambulatory Visit | Attending: Radiation Oncology | Admitting: Radiation Oncology

## 2022-11-18 DIAGNOSIS — C7931 Secondary malignant neoplasm of brain: Secondary | ICD-10-CM

## 2022-11-18 DIAGNOSIS — C3491 Malignant neoplasm of unspecified part of right bronchus or lung: Secondary | ICD-10-CM

## 2022-11-25 ENCOUNTER — Ambulatory Visit: Payer: Medicare Other | Admitting: Physician Assistant

## 2022-11-25 ENCOUNTER — Ambulatory Visit: Payer: Medicare Other

## 2022-11-25 ENCOUNTER — Other Ambulatory Visit: Payer: Medicare Other

## 2022-11-27 ENCOUNTER — Inpatient Hospital Stay: Payer: Medicare Other | Attending: Internal Medicine

## 2022-11-27 ENCOUNTER — Other Ambulatory Visit: Payer: Self-pay

## 2022-11-27 DIAGNOSIS — C3411 Malignant neoplasm of upper lobe, right bronchus or lung: Secondary | ICD-10-CM | POA: Insufficient documentation

## 2022-11-27 DIAGNOSIS — C7931 Secondary malignant neoplasm of brain: Secondary | ICD-10-CM | POA: Insufficient documentation

## 2022-11-27 DIAGNOSIS — Z452 Encounter for adjustment and management of vascular access device: Secondary | ICD-10-CM | POA: Insufficient documentation

## 2022-11-27 DIAGNOSIS — Z95828 Presence of other vascular implants and grafts: Secondary | ICD-10-CM

## 2022-11-27 MED ORDER — HEPARIN SOD (PORK) LOCK FLUSH 100 UNIT/ML IV SOLN
500.0000 [IU] | Freq: Once | INTRAVENOUS | Status: AC
  Administered 2022-11-27: 500 [IU]

## 2022-11-27 MED ORDER — SODIUM CHLORIDE 0.9% FLUSH
10.0000 mL | Freq: Once | INTRAVENOUS | Status: AC
  Administered 2022-11-27: 10 mL

## 2022-12-18 ENCOUNTER — Other Ambulatory Visit: Payer: Self-pay | Admitting: Internal Medicine

## 2022-12-19 ENCOUNTER — Other Ambulatory Visit: Payer: Self-pay

## 2023-01-09 ENCOUNTER — Other Ambulatory Visit: Payer: Self-pay | Admitting: Radiation Therapy

## 2023-01-09 DIAGNOSIS — C7931 Secondary malignant neoplasm of brain: Secondary | ICD-10-CM

## 2023-01-16 ENCOUNTER — Encounter (HOSPITAL_COMMUNITY): Payer: Self-pay

## 2023-01-16 ENCOUNTER — Ambulatory Visit (HOSPITAL_COMMUNITY)
Admission: RE | Admit: 2023-01-16 | Discharge: 2023-01-16 | Disposition: A | Discharge status: Home / Self Care | Payer: Medicare Other | Source: Ambulatory Visit | Attending: Internal Medicine | Admitting: Internal Medicine

## 2023-01-16 DIAGNOSIS — C3490 Malignant neoplasm of unspecified part of unspecified bronchus or lung: Secondary | ICD-10-CM | POA: Insufficient documentation

## 2023-01-16 DIAGNOSIS — R188 Other ascites: Secondary | ICD-10-CM | POA: Diagnosis not present

## 2023-01-16 DIAGNOSIS — J439 Emphysema, unspecified: Secondary | ICD-10-CM | POA: Diagnosis not present

## 2023-01-16 DIAGNOSIS — I700 Atherosclerosis of aorta: Secondary | ICD-10-CM | POA: Diagnosis not present

## 2023-01-19 ENCOUNTER — Inpatient Hospital Stay: Payer: Medicare Other | Attending: Internal Medicine

## 2023-01-19 ENCOUNTER — Other Ambulatory Visit: Payer: Self-pay

## 2023-01-19 ENCOUNTER — Inpatient Hospital Stay (HOSPITAL_BASED_OUTPATIENT_CLINIC_OR_DEPARTMENT_OTHER): Payer: Medicare Other | Admitting: Internal Medicine

## 2023-01-19 VITALS — BP 99/64 | HR 67 | Temp 97.9°F | Resp 14 | Ht 72.0 in | Wt 163.2 lb

## 2023-01-19 DIAGNOSIS — C3490 Malignant neoplasm of unspecified part of unspecified bronchus or lung: Secondary | ICD-10-CM

## 2023-01-19 DIAGNOSIS — Z452 Encounter for adjustment and management of vascular access device: Secondary | ICD-10-CM | POA: Insufficient documentation

## 2023-01-19 DIAGNOSIS — C7931 Secondary malignant neoplasm of brain: Secondary | ICD-10-CM | POA: Diagnosis not present

## 2023-01-19 DIAGNOSIS — C3411 Malignant neoplasm of upper lobe, right bronchus or lung: Secondary | ICD-10-CM | POA: Insufficient documentation

## 2023-01-19 DIAGNOSIS — C3491 Malignant neoplasm of unspecified part of right bronchus or lung: Secondary | ICD-10-CM

## 2023-01-19 DIAGNOSIS — Z95828 Presence of other vascular implants and grafts: Secondary | ICD-10-CM

## 2023-01-19 LAB — CBC WITH DIFFERENTIAL (CANCER CENTER ONLY)
Abs Immature Granulocytes: 0.01 10*3/uL (ref 0.00–0.07)
Basophils Absolute: 0 10*3/uL (ref 0.0–0.1)
Basophils Relative: 1 %
Eosinophils Absolute: 0.2 10*3/uL (ref 0.0–0.5)
Eosinophils Relative: 5 %
HCT: 32.2 % — ABNORMAL LOW (ref 39.0–52.0)
Hemoglobin: 11 g/dL — ABNORMAL LOW (ref 13.0–17.0)
Immature Granulocytes: 0 %
Lymphocytes Relative: 47 %
Lymphs Abs: 1.8 10*3/uL (ref 0.7–4.0)
MCH: 27 pg (ref 26.0–34.0)
MCHC: 34.2 g/dL (ref 30.0–36.0)
MCV: 79.1 fL — ABNORMAL LOW (ref 80.0–100.0)
Monocytes Absolute: 0.5 10*3/uL (ref 0.1–1.0)
Monocytes Relative: 12 %
Neutro Abs: 1.3 10*3/uL — ABNORMAL LOW (ref 1.7–7.7)
Neutrophils Relative %: 35 %
Platelet Count: 150 10*3/uL (ref 150–400)
RBC: 4.07 MIL/uL — ABNORMAL LOW (ref 4.22–5.81)
RDW: 14.2 % (ref 11.5–15.5)
WBC Count: 3.9 10*3/uL — ABNORMAL LOW (ref 4.0–10.5)
nRBC: 0 % (ref 0.0–0.2)

## 2023-01-19 LAB — CMP (CANCER CENTER ONLY)
ALT: 8 U/L (ref 0–44)
AST: 18 U/L (ref 15–41)
Albumin: 3.4 g/dL — ABNORMAL LOW (ref 3.5–5.0)
Alkaline Phosphatase: 57 U/L (ref 38–126)
Anion gap: 5 (ref 5–15)
BUN: 23 mg/dL (ref 8–23)
CO2: 26 mmol/L (ref 22–32)
Calcium: 8.5 mg/dL — ABNORMAL LOW (ref 8.9–10.3)
Chloride: 108 mmol/L (ref 98–111)
Creatinine: 2.08 mg/dL — ABNORMAL HIGH (ref 0.61–1.24)
GFR, Estimated: 35 mL/min — ABNORMAL LOW (ref >60)
Glucose, Bld: 86 mg/dL (ref 70–99)
Potassium: 3.9 mmol/L (ref 3.5–5.1)
Sodium: 139 mmol/L (ref 135–145)
Total Bilirubin: 0.3 mg/dL (ref 0.3–1.2)
Total Protein: 6.7 g/dL (ref 6.5–8.1)

## 2023-01-19 MED ORDER — SODIUM CHLORIDE 0.9% FLUSH
10.0000 mL | Freq: Once | INTRAVENOUS | Status: AC
  Administered 2023-01-19: 10 mL

## 2023-01-19 MED ORDER — HEPARIN SOD (PORK) LOCK FLUSH 100 UNIT/ML IV SOLN
500.0000 [IU] | Freq: Once | INTRAVENOUS | Status: AC
  Administered 2023-01-19: 500 [IU]

## 2023-01-19 NOTE — Progress Notes (Signed)
[facility] [phone]   [phone]  OFFICE PROGRESS NOTE  Corwin Levins, MD [address] [place] [address]  DIAGNOSIS: Stage IV (T3, N2, M1c) non-small cell lung cancer favoring adenocarcinoma presented with right upper lobe lung mass in addition to right lower lobe, left lower lobe in addition to right hilar and mediastinal lymphadenopathy in addition to metastatic brain lesions diagnosed in October 2021.  Molecular studies by [employer]:  [id number], 9.5%,   PRIOR THERAPY:  1) SRS to 3 brain lesion under the care of Dr. Mitzi Hansen. 2) Systemic chemotherapy with carboplatin for AUC of 5, Alimta 500 mg/M2 and Keytruda 200 mg IV every 3 weeks.  First dose April 24, 2020.  Status post 43 cycles.  Starting from cycle #5 the patient will be on maintenance treatment with Alimta and Keytruda every 3 weeks.  Last dose  was given on 09/23/2022 discontinued secondary to renal insufficiency and worsening edema  CURRENT THERAPY: Observation.  INTERVAL HISTORY: Nicolas Allen 67 y.o. male returns to the clinic today for follow-up visit accompanied by his wife.  The patient is feeling fine today with no concerning complaints.  He denied having any chest pain, shortness of breath, cough or hemoptysis.  He has no nausea, vomiting, diarrhea or constipation.  He has no headache or visual changes.  He has no recent weight loss or night sweats.  He is currently on observation.  He had repeat CT scan of the chest, abdomen and pelvis performed recently and he is here for evaluation and discussion of his scan results.   MEDICAL HISTORY: Past Medical History:  Diagnosis Date   Anemia 06/24/2012   ANXIETY 02/03/2007   Cervical disc disease 02/12/2012   S/p surgury jan 2013   Erectile dysfunction 02/11/2011   GERD (gastroesophageal reflux disease)    GLUCOSE INTOLERANCE 01/04/2008   HYPERLIPIDEMIA 02/03/2007   HYPERTENSION 02/03/2007   IBS (irritable  bowel syndrome)    Impaired glucose tolerance 02/11/2011   nscl ca 03/30/2020   Smoker 08/22/2014   Substance abuse (HCC) 2001   sober for 16 yrs    ALLERGIES:  has No Known Allergies.  MEDICATIONS:  Current Outpatient Medications  Medication Sig Dispense Refill   amLODipine-benazepril (LOTREL) 10-20 MG capsule Take 1 capsule by mouth once daily 90 capsule 3   aspirin 325 MG EC tablet Take 1 tablet (325 mg total) by mouth daily. 90 tablet 99   b complex vitamins tablet Take 1 tablet by mouth daily.     cholecalciferol (VITAMIN D3) 25 MCG (1000 UNIT) tablet Take 1,000 Units by mouth daily.     folic acid (FOLVITE) 1 MG tablet Take 1 tablet by mouth once daily 90 tablet 3   Garlic 1000 MG CAPS Take 1,000 mg by mouth daily.      lidocaine-prilocaine (EMLA) cream Apply to the Port-A-Cath site 30-60 minutes before chemotherapy. 30 g 0   Omega-3 Fatty Acids (FISH OIL PO) Take 1 capsule by mouth daily.      polyethylene glycol (MIRALAX / GLYCOLAX) 17 g packet Take 17 g by mouth daily as needed.     prochlorperazine (COMPAZINE) 10 MG tablet Take 1 tablet (10 mg total) by mouth every 6 (six) hours as needed for nausea or vomiting. 30 tablet 0   rosuvastatin (CRESTOR) 40 MG tablet Take 1 tablet (40 mg total) by mouth daily. 90 tablet 3   vardenafil (LEVITRA) 20 MG tablet TAKE 1 TABLET  BY MOUTH AS NEEDED FOR  ERECTILE  DYSFUNCTION 10 tablet 5   No current facility-administered medications for this visit.    SURGICAL HISTORY:  Past Surgical History:  Procedure Laterality Date   BRONCHIAL BIOPSY  03/30/2020   Procedure: BRONCHIAL BIOPSIES;  Surgeon: Josephine Igo, DO;  Location: [facility];  Service: Pulmonary;;   BRONCHIAL BRUSHINGS  03/30/2020   Procedure: BRONCHIAL BRUSHINGS;  Surgeon: Josephine Igo, DO;  Location: [facility];  Service: Pulmonary;;   BRONCHIAL NEEDLE ASPIRATION BIOPSY  03/30/2020   Procedure: BRONCHIAL NEEDLE ASPIRATION BIOPSIES;  Surgeon: Josephine Igo, DO;   Location: [facility];  Service: Pulmonary;;   BRONCHIAL WASHINGS  03/30/2020   Procedure: BRONCHIAL WASHINGS;  Surgeon: Josephine Igo, DO;  Location: [facility];  Service: Pulmonary;;   COLONOSCOPY  2007   IR IMAGING GUIDED PORT INSERTION  04/23/2020   neck fusion  2012   C4   NO PAST SURGERIES     VIDEO BRONCHOSCOPY WITH ENDOBRONCHIAL NAVIGATION N/A 03/30/2020   Procedure: VIDEO BRONCHOSCOPY WITH ENDOBRONCHIAL NAVIGATION;  Surgeon: Josephine Igo, DO;  Location: [facility];  Service: Pulmonary;  Laterality: N/A;   VIDEO BRONCHOSCOPY WITH ENDOBRONCHIAL ULTRASOUND N/A 03/30/2020   Procedure: VIDEO BRONCHOSCOPY WITH ENDOBRONCHIAL ULTRASOUND;  Surgeon: Josephine Igo, DO;  Location: [facility];  Service: Pulmonary;  Laterality: N/A;    REVIEW OF SYSTEMS:  Constitutional: positive for fatigue Eyes: negative Ears, nose, mouth, throat, and face: negative Respiratory: negative Cardiovascular: negative Gastrointestinal: negative Genitourinary:negative Integument/breast: negative Hematologic/lymphatic: negative Musculoskeletal:negative Neurological: negative Behavioral/Psych: negative Endocrine: negative Allergic/Immunologic: negative   PHYSICAL EXAMINATION: General appearance: alert, cooperative, fatigued, and no distress Head: Normocephalic, without obvious abnormality, atraumatic Neck: no adenopathy, no JVD, supple, symmetrical, trachea midline, and thyroid not enlarged, symmetric, no tenderness/mass/nodules Lymph nodes: Cervical, supraclavicular, and axillary nodes normal. Resp: clear to auscultation bilaterally Back: symmetric, no curvature. ROM normal. No CVA tenderness. Cardio: regular rate and rhythm, S1, S2 normal, no murmur, click, rub or gallop GI: soft, non-tender; bowel sounds normal; no masses,  no organomegaly Extremities: extremities normal, atraumatic, no cyanosis or edema Neurologic: Alert and oriented X 3, normal strength and tone. Normal symmetric  reflexes. Normal coordination and gait  ECOG PERFORMANCE STATUS: 1 - Symptomatic but completely ambulatory  Blood pressure 99/64, pulse 67, temperature 97.9 F (36.6 C), temperature source Oral, resp. rate 14, height 6' (1.829 m), weight 163 lb 3.2 oz (74 kg), SpO2 100%.  LABORATORY DATA: Lab Results  Component Value Date   WBC 3.9 (L) 01/19/2023   HGB 11.0 (L) 01/19/2023   HCT 32.2 (L) 01/19/2023   MCV 79.1 (L) 01/19/2023   PLT 150 01/19/2023      Chemistry      Component Value Date/Time   NA 139 01/19/2023 0929   K 3.9 01/19/2023 0929   CL 108 01/19/2023 0929   CO2 26 01/19/2023 0929   BUN 23 01/19/2023 0929   CREATININE 2.08 (H) 01/19/2023 0929   CREATININE 0.88 02/21/2020 1115      Component Value Date/Time   CALCIUM 8.5 (L) 01/19/2023 0929   ALKPHOS 57 01/19/2023 0929   AST 18 01/19/2023 0929   ALT 8 01/19/2023 0929   BILITOT 0.3 01/19/2023 0929       RADIOGRAPHIC STUDIES: CT Chest Wo Contrast  Result Date: 01/19/2023 CLINICAL DATA:  Non-small cell lung cancer (NSCLC), staging * Tracking Code: BO * EXAM: CT CHEST, ABDOMEN AND PELVIS WITHOUT CONTRAST TECHNIQUE: Multidetector CT imaging of the chest, abdomen and pelvis  was performed following the standard protocol without IV contrast. RADIATION DOSE REDUCTION: This exam was performed according to the departmental dose-optimization program which includes automated exposure control, adjustment of the mA and/or kV according to patient size and/or use of iterative reconstruction technique. COMPARISON:  CT scan chest, abdomen and pelvis from 10/13/2022. FINDINGS: CT CHEST FINDINGS Cardiovascular: Normal cardiac size. Mild pericardial effusion. No aortic aneurysm. Mediastinum/Nodes: Visualized thyroid gland appears grossly unremarkable. No solid / cystic mediastinal masses. The esophagus is nondistended precluding optimal assessment. No axillary, mediastinal or hilar lymphadenopathy by size criteria. Lungs/Pleura: The central  tracheo-bronchial tree is patent. Redemonstration of moderate-to-large right pleural effusion, unchanged. There is small left pleural effusion, which has decreased since the prior study. There are mild centrilobular emphysematous changes throughout bilateral lungs with upper lobe predominance. Redemonstration of multiple, ground-glass and solid opacities throughout bilateral lungs with irregular margins. When compared to the prior study dating back to July 18, 2022, no significant interval change. However, note is made of a pleural-based solid nodule in the middle lobe which has increased in size. The nodule currently measures 6.8 x 8.4 mm, measured up to 4.4 x 4.6 mm on the prior study from July 18, 2022 and 5.0 x 6.5 on the prior study from 10/13/2022. No new or suspicious lung nodules. Musculoskeletal: A CT Port-a-Cath is seen in the right upper chest wall with the catheter terminating in the cavo-atrial junction region. Redemonstration of multiple soft tissue attenuation structures centered in the subcutaneous fat over the chest wall with largest along the left lower anterior chest wall measuring up to 1.5 x 2.6 cm. These are indeterminate but favored to represent epidermal inclusion cysts. No significant interval change. There is stable intramuscular lipoma along the left upper anterolateral chest wall. Visualized soft tissues of the chest wall are otherwise grossly unremarkable. No suspicious osseous lesions. Partially seen lower cervical spinal fixation hardware. CT ABDOMEN PELVIS FINDINGS Hepatobiliary: The liver is normal in size. Non-cirrhotic configuration. No suspicious mass. No intrahepatic or extrahepatic bile duct dilation. No calcified gallstones. Normal gallbladder wall thickness. No pericholecystic inflammatory changes. Pancreas: Unremarkable. No pancreatic ductal dilatation or surrounding inflammatory changes. Spleen: Within normal limits. No focal lesion. Adrenals/Urinary Tract: Adrenal  glands are unremarkable. No suspicious renal mass. No hydronephrosis. No renal or ureteric calculi. Urinary bladder is under distended, precluding optimal assessment. However, no large mass or stones identified. There is mild-to-moderate diffuse circumferential wall thickening, grossly similar to the prior study. Findings are likely accentuated by underdistention. No perivesical fat stranding. Stomach/Bowel: No disproportionate dilation of the small or large bowel loops. No evidence of abnormal bowel wall thickening or inflammatory changes. The appendix is unremarkable. Vascular/Lymphatic: Small-to-moderate ascites, increased since the prior study. No pneumoperitoneum. No abdominal or pelvic lymphadenopathy, by size criteria. No aneurysmal dilation of the major abdominal arteries. There are moderate peripheral atherosclerotic vascular calcifications of the aorta and its major branches. Reproductive: Normal size prostate. Symmetric seminal vesicles. Other: There is a tiny fat containing umbilical hernia. The soft tissues and abdominal wall are otherwise unremarkable. Musculoskeletal: No suspicious osseous lesions. There are mild multilevel degenerative changes in the visualized spine. IMPRESSION: 1. Interval increase in size of a pleural-based solid nodule in the middle lobe measuring up to 6.8 x 8.4 mm, measured up to 4.4 x 4.6 mm on the prior study from 07/18/2022 and 5.0 x 6.5 on the prior study from 10/13/2022. This is compatible with progressive metastatic disease. 2. Stable moderate-to-large right pleural effusion. Decreased small left pleural effusion. 3.  Stable multiple ground-glass and solid opacities throughout bilateral lungs with irregular margins. 4. Small-to-moderate ascites, increased since the prior study. 5. Multiple other nonacute observations, as described above. Aortic Atherosclerosis (ICD10-I70.0) and Emphysema (ICD10-J43.9). Electronically Signed   By: Jules Schick M.D.   On: 01/19/2023 08:41    CT Abdomen Pelvis Wo Contrast  Result Date: 01/19/2023 CLINICAL DATA:  Non-small cell lung cancer (NSCLC), staging * Tracking Code: BO * EXAM: CT CHEST, ABDOMEN AND PELVIS WITHOUT CONTRAST TECHNIQUE: Multidetector CT imaging of the chest, abdomen and pelvis was performed following the standard protocol without IV contrast. RADIATION DOSE REDUCTION: This exam was performed according to the departmental dose-optimization program which includes automated exposure control, adjustment of the mA and/or kV according to patient size and/or use of iterative reconstruction technique. COMPARISON:  CT scan chest, abdomen and pelvis from 10/13/2022. FINDINGS: CT CHEST FINDINGS Cardiovascular: Normal cardiac size. Mild pericardial effusion. No aortic aneurysm. Mediastinum/Nodes: Visualized thyroid gland appears grossly unremarkable. No solid / cystic mediastinal masses. The esophagus is nondistended precluding optimal assessment. No axillary, mediastinal or hilar lymphadenopathy by size criteria. Lungs/Pleura: The central tracheo-bronchial tree is patent. Redemonstration of moderate-to-large right pleural effusion, unchanged. There is small left pleural effusion, which has decreased since the prior study. There are mild centrilobular emphysematous changes throughout bilateral lungs with upper lobe predominance. Redemonstration of multiple, ground-glass and solid opacities throughout bilateral lungs with irregular margins. When compared to the prior study dating back to July 18, 2022, no significant interval change. However, note is made of a pleural-based solid nodule in the middle lobe which has increased in size. The nodule currently measures 6.8 x 8.4 mm, measured up to 4.4 x 4.6 mm on the prior study from July 18, 2022 and 5.0 x 6.5 on the prior study from 10/13/2022. No new or suspicious lung nodules. Musculoskeletal: A CT Port-a-Cath is seen in the right upper chest wall with the catheter terminating in the  cavo-atrial junction region. Redemonstration of multiple soft tissue attenuation structures centered in the subcutaneous fat over the chest wall with largest along the left lower anterior chest wall measuring up to 1.5 x 2.6 cm. These are indeterminate but favored to represent epidermal inclusion cysts. No significant interval change. There is stable intramuscular lipoma along the left upper anterolateral chest wall. Visualized soft tissues of the chest wall are otherwise grossly unremarkable. No suspicious osseous lesions. Partially seen lower cervical spinal fixation hardware. CT ABDOMEN PELVIS FINDINGS Hepatobiliary: The liver is normal in size. Non-cirrhotic configuration. No suspicious mass. No intrahepatic or extrahepatic bile duct dilation. No calcified gallstones. Normal gallbladder wall thickness. No pericholecystic inflammatory changes. Pancreas: Unremarkable. No pancreatic ductal dilatation or surrounding inflammatory changes. Spleen: Within normal limits. No focal lesion. Adrenals/Urinary Tract: Adrenal glands are unremarkable. No suspicious renal mass. No hydronephrosis. No renal or ureteric calculi. Urinary bladder is under distended, precluding optimal assessment. However, no large mass or stones identified. There is mild-to-moderate diffuse circumferential wall thickening, grossly similar to the prior study. Findings are likely accentuated by underdistention. No perivesical fat stranding. Stomach/Bowel: No disproportionate dilation of the small or large bowel loops. No evidence of abnormal bowel wall thickening or inflammatory changes. The appendix is unremarkable. Vascular/Lymphatic: Small-to-moderate ascites, increased since the prior study. No pneumoperitoneum. No abdominal or pelvic lymphadenopathy, by size criteria. No aneurysmal dilation of the major abdominal arteries. There are moderate peripheral atherosclerotic vascular calcifications of the aorta and its major branches. Reproductive:  Normal size prostate. Symmetric seminal vesicles. Other: There is a tiny  fat containing umbilical hernia. The soft tissues and abdominal wall are otherwise unremarkable. Musculoskeletal: No suspicious osseous lesions. There are mild multilevel degenerative changes in the visualized spine. IMPRESSION: 1. Interval increase in size of a pleural-based solid nodule in the middle lobe measuring up to 6.8 x 8.4 mm, measured up to 4.4 x 4.6 mm on the prior study from 07/18/2022 and 5.0 x 6.5 on the prior study from 10/13/2022. This is compatible with progressive metastatic disease. 2. Stable moderate-to-large right pleural effusion. Decreased small left pleural effusion. 3. Stable multiple ground-glass and solid opacities throughout bilateral lungs with irregular margins. 4. Small-to-moderate ascites, increased since the prior study. 5. Multiple other nonacute observations, as described above. Aortic Atherosclerosis (ICD10-I70.0) and Emphysema (ICD10-J43.9). Electronically Signed   By: Jules Schick M.D.   On: 01/19/2023 08:41     ASSESSMENT AND PLAN: This is a very pleasant 67 years old African-American male recently diagnosed with stage IV (T3, N2, M1c)  non-small cell lung cancer, adenocarcinoma presented with right upper lobe lung mass in addition to right lower lobe, left lower lobe in addition to right hilar and mediastinal lymphadenopathy in addition to metastatic brain lesions diagnosed in October 2021. The patient has molecular studies by [employer] that showed no actionable mutations. He underwent SRS treatment to his brain lesion. The patient underwent systemic chemotherapy with carboplatin for AUC of 5, Alimta 500 mg/M2 and Keytruda 200 mg IV every 3 weeks status post 43 cycles.  Starting from cycle #5 the patient is on maintenance treatment with Alimta and Keytruda every 3 weeks. I recommended for the patient to stop his treatment with maintenance Alimta and Keytruda at this point because of the renal  insufficiency, worsening anemia and edema. He is currently on observation and he is feeling fine with no concerning complaints. He had repeat CT scan of the chest, abdomen and pelvis performed recently.  I personally and independently reviewed the scan and discussed the results with the patient and his wife. His scan showed no concerning findings for disease progression except for interval increase in size of the peripheral pleural-based solid nodule in the right middle lobe but otherwise he has a stable moderate to large pleural effusion and decreased left pleural effusion.  I discussed with the patient consideration of drainage of the right pleural effusion but he declined since he is not symptomatic.  I also recommend for him to see Dr. Mitzi Hansen for consideration of SBRT to the enlarging right middle lobe lung nodule and he is in agreement with this plan. He will have a Port-A-Cath flush every 6 weeks. For the worsening renal insufficiency, I will refer him to nephrology for evaluation and management of his condition. The patient will come back for follow-up visit in 3 months for evaluation with repeat CT scan of the chest, abdomen and pelvis for restaging of his disease. He was advised to call immediately if he has any other concerning symptoms in the interval. The patient voices understanding of current disease status and treatment options and is in agreement with the current care plan.  All questions were answered. The patient knows to call the clinic with any problems, questions or concerns. We can certainly see the patient much sooner if necessary.  Disclaimer: This note was dictated with voice recognition software. Similar sounding words can inadvertently be transcribed and may not be corrected upon review.

## 2023-01-20 ENCOUNTER — Ambulatory Visit: Payer: Medicare Other

## 2023-01-20 ENCOUNTER — Encounter: Payer: Self-pay | Admitting: Radiation Oncology

## 2023-01-20 ENCOUNTER — Ambulatory Visit: Admission: RE | Admit: 2023-01-20 | Payer: Medicare Other | Source: Ambulatory Visit | Admitting: Radiation Oncology

## 2023-01-20 DIAGNOSIS — C3482 Malignant neoplasm of overlapping sites of left bronchus and lung: Secondary | ICD-10-CM | POA: Diagnosis not present

## 2023-01-20 DIAGNOSIS — Z87891 Personal history of nicotine dependence: Secondary | ICD-10-CM | POA: Diagnosis not present

## 2023-01-20 DIAGNOSIS — C3481 Malignant neoplasm of overlapping sites of right bronchus and lung: Secondary | ICD-10-CM | POA: Diagnosis not present

## 2023-01-20 DIAGNOSIS — C3491 Malignant neoplasm of unspecified part of right bronchus or lung: Secondary | ICD-10-CM

## 2023-01-20 DIAGNOSIS — C7931 Secondary malignant neoplasm of brain: Secondary | ICD-10-CM | POA: Diagnosis not present

## 2023-01-20 NOTE — Progress Notes (Signed)
Nursing interview for Malignant neoplasm metastatic to brain [facility]). A diagnosis of Non-small cell carcinoma of right lung, stage 4 (HCC) was also pertinent to this visit. Patient identity verified x2.  Patient reports moderate fatigue. No other issues conveyed at this time.  Meaningful use complete.  Vitals- There were no vitals taken for this visit.   This concludes the interaction.  Ruel Favors, LPN

## 2023-01-20 NOTE — Progress Notes (Signed)
Radiation Oncology         [phone] ________________________________  Outpatient Reconsultation - Conducted via telephone at patient request.  I spoke with the patient to conduct this visit via telephone. The patient was notified in advance and was offered an in person or telemedicine meeting to allow for face to face communication but instead preferred to proceed with a telephone visit.    Name: JAKEITH DEMONT        MRN: [id number]  Date of Service: 01/20/2023 DOB: 19-Oct-1955  NW:GNFA, Len Blalock, MD  Si Gaul, MD     REFERRING PHYSICIAN: Si Gaul, MD   DIAGNOSIS: The primary encounter diagnosis was Non-small cell carcinoma of right lung, stage 4 (HCC). A diagnosis of Malignant neoplasm metastatic to brain [facility]) was also pertinent to this visit.   HISTORY OF PRESENT ILLNESS: TRAYVEON HAGEMEIER is a 67 y.o. male with a history of stage IV non-small cell lung cancer, adenocarcinoma involving multifocal disease in the lung and 3 brain metastases. He was diagnosed in the fall of 2021 and found to have metastatic disease in the lung and brain for which he has received stereotactic radiosurgery [facility]) with Dr. Mitzi Hansen.  While he did have pseudoprogression due to his immunotherapy, there was growth in the same mid cerebellar lesion and after discussion in brain oncology conference, it was felt he should pursue re-irradiation which was completed with SRS  on 02/12/22. He has recently completed consolidative chemo/immunotherapy on 09/25/22 with Dr. Arbutus Ped.  His most recent MRI on 11/11/22 showed increase in the edema around the lesion in the cerebellar vermis. The area in total measures 1.9 cm, previously 1.4 cm. No new disease is noted. Today discussion in brain oncology conference was that the impression is that the lesion continues to be a pretreated site without concern for active disease, but inflammatory change from recent treatment and immunotherapy. Recommendations were to continue  close follow-up and surveillance of this but to have a low threshold if he became symptomatic to consider steroids or avastin.    Since our visit in June 2021 to review his MRI scan, his CT CAP for surveillance was performed on 01/16/23. This showed an increase in a nodule that was being followed in the RML. It is pleural based, and measures 6.8 mm, previously 6.5 mm in April 2024, previously 4.4 mm in February 2024. Given the changes over time, without any additional concerns for progressive or new disease, he's contacted to discuss options of stereotactic body radiotherapy 9SBRT).  PREVIOUS RADIATION THERAPY:  02/12/2022 through 02/12/2022 [facility] Treatment Site Technique Total Dose (Gy) Dose per Fx (Gy) Completed Fx Beam Energies  Brain:  PTV_[id number]_17mm Mid Cerebellum IMRT 20/20 20 1/1 6XFFF    04/20/20 SRS Treatment: Each site below was treated to 20 Gy in 1 fraction PTV1 Rt Cerebellum 4mm PTV2Mid cerebellum 11mm PTV3Lt Parietal 6mm    PAST MEDICAL HISTORY:  Past Medical History:  Diagnosis Date   Anemia 06/24/2012   ANXIETY 02/03/2007   Cervical disc disease 02/12/2012   S/p surgury jan 2013   Erectile dysfunction 02/11/2011   GERD (gastroesophageal reflux disease)    GLUCOSE INTOLERANCE 01/04/2008   HYPERLIPIDEMIA 02/03/2007   HYPERTENSION 02/03/2007   IBS (irritable bowel syndrome)    Impaired glucose tolerance 02/11/2011   nscl ca 03/30/2020   Smoker 08/22/2014   Substance abuse (HCC) 2001   sober for 16 yrs       PAST SURGICAL HISTORY: Past Surgical History:  Procedure Laterality Date  BRONCHIAL BIOPSY  03/30/2020   Procedure: BRONCHIAL BIOPSIES;  Surgeon: Josephine Igo, DO;  Location: [facility];  Service: Pulmonary;;   BRONCHIAL BRUSHINGS  03/30/2020   Procedure: BRONCHIAL BRUSHINGS;  Surgeon: Josephine Igo, DO;  Location: [facility];  Service: Pulmonary;;   BRONCHIAL NEEDLE ASPIRATION BIOPSY  03/30/2020   Procedure: BRONCHIAL NEEDLE ASPIRATION  BIOPSIES;  Surgeon: Josephine Igo, DO;  Location: [facility];  Service: Pulmonary;;   BRONCHIAL WASHINGS  03/30/2020   Procedure: BRONCHIAL WASHINGS;  Surgeon: Josephine Igo, DO;  Location: [facility];  Service: Pulmonary;;   COLONOSCOPY  2007   IR IMAGING GUIDED PORT INSERTION  04/23/2020   neck fusion  2012   C4   NO PAST SURGERIES     VIDEO BRONCHOSCOPY WITH ENDOBRONCHIAL NAVIGATION N/A 03/30/2020   Procedure: VIDEO BRONCHOSCOPY WITH ENDOBRONCHIAL NAVIGATION;  Surgeon: Josephine Igo, DO;  Location: [facility];  Service: Pulmonary;  Laterality: N/A;   VIDEO BRONCHOSCOPY WITH ENDOBRONCHIAL ULTRASOUND N/A 03/30/2020   Procedure: VIDEO BRONCHOSCOPY WITH ENDOBRONCHIAL ULTRASOUND;  Surgeon: Josephine Igo, DO;  Location: [facility];  Service: Pulmonary;  Laterality: N/A;     FAMILY HISTORY:  Family History  Problem Relation Age of Onset   Cancer Mother        esophagus   Cancer Cousin    Stroke Other    Hypertension Other    Colon cancer Neg Hx      SOCIAL HISTORY:  reports that he quit smoking about 2 years ago. His smoking use included cigarettes. He started smoking about 32 years ago. He has a 45 pack-year smoking history. He has quit using smokeless tobacco.  His smokeless tobacco use included chew. He reports that he does not drink alcohol and does not use drugs. The patient is married and resides in [place]. 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    ALLERGIES: Patient has no known allergies.   MEDICATIONS:  Current Outpatient Medications  Medication Sig Dispense Refill   amLODipine-benazepril (LOTREL) 10-20 MG capsule Take 1 capsule by mouth once daily 90 capsule 3   aspirin 325 MG EC tablet Take 1 tablet (325 mg total) by mouth daily. 90 tablet 99   b complex vitamins tablet Take 1 tablet by mouth daily.     cholecalciferol (VITAMIN D3) 25 MCG (1000 UNIT)  tablet Take 1,000 Units by mouth daily.     folic acid (FOLVITE) 1 MG tablet Take 1 tablet by mouth once daily 90 tablet 3   Garlic 1000 MG CAPS Take 1,000 mg by mouth daily.      lidocaine-prilocaine (EMLA) cream Apply to the Port-A-Cath site 30-60 minutes before chemotherapy. 30 g 0   Omega-3 Fatty Acids (FISH OIL PO) Take 1 capsule by mouth daily.      polyethylene glycol (MIRALAX / GLYCOLAX) 17 g packet Take 17 g by mouth daily as needed.     prochlorperazine (COMPAZINE) 10 MG tablet Take 1 tablet (10 mg total) by mouth every 6 (six) hours as needed for nausea or vomiting. 30 tablet 0   rosuvastatin (CRESTOR) 40 MG tablet Take 1 tablet (40 mg total) by mouth daily. 90 tablet 3   vardenafil (LEVITRA) 20 MG tablet TAKE 1 TABLET BY MOUTH AS NEEDED FOR  ERECTILE  DYSFUNCTION (Patient not taking: Reported on 01/20/2023) 10 tablet 5   No current facility-administered medications for this encounter.     REVIEW  OF SYSTEMS: On review of systems, the patient reports he is doing well overall. No new neurologic complaints are verbalized. He denies any shortness of breath, fevers, or chest pain. No other complaints are verbalized.   PHYSICAL EXAM:  Unable to assess given encounter type.    ECOG = 0  0 - Asymptomatic (Fully active, able to carry on all predisease activities without restriction)  1 - Symptomatic but completely ambulatory (Restricted in physically strenuous activity but ambulatory and able to carry out work of a light or sedentary nature. For example, light housework, office work)  2 - Symptomatic, <50% in bed during the day (Ambulatory and capable of all self care but unable to carry out any work activities. Up and about more than 50% of waking hours)  3 - Symptomatic, >50% in bed, but not bedbound (Capable of only limited self-care, confined to bed or chair 50% or more of waking hours)  4 - Bedbound (Completely disabled. Cannot carry on any self-care. Totally confined to bed or  chair)  5 - Death   Santiago Glad MM, Creech RH, Tormey DC, et al. [phone]). "Toxicity and response criteria of the [facility]". Am. Evlyn Clines. Oncol. 5 (6): 649-55    LABORATORY DATA:  Lab Results  Component Value Date   WBC 3.9 (L) 01/19/2023   HGB 11.0 (L) 01/19/2023   HCT 32.2 (L) 01/19/2023   MCV 79.1 (L) 01/19/2023   PLT 150 01/19/2023   Lab Results  Component Value Date   NA 139 01/19/2023   K 3.9 01/19/2023   CL 108 01/19/2023   CO2 26 01/19/2023   Lab Results  Component Value Date   ALT 8 01/19/2023   AST 18 01/19/2023   ALKPHOS 57 01/19/2023   BILITOT 0.3 01/19/2023      RADIOGRAPHY: CT Chest Wo Contrast  Result Date: 01/19/2023 CLINICAL DATA:  Non-small cell lung cancer (NSCLC), staging * Tracking Code: BO * EXAM: CT CHEST, ABDOMEN AND PELVIS WITHOUT CONTRAST TECHNIQUE: Multidetector CT imaging of the chest, abdomen and pelvis was performed following the standard protocol without IV contrast. RADIATION DOSE REDUCTION: This exam was performed according to the departmental dose-optimization program which includes automated exposure control, adjustment of the mA and/or kV according to patient size and/or use of iterative reconstruction technique. COMPARISON:  CT scan chest, abdomen and pelvis from 10/13/2022. FINDINGS: CT CHEST FINDINGS Cardiovascular: Normal cardiac size. Mild pericardial effusion. No aortic aneurysm. Mediastinum/Nodes: Visualized thyroid gland appears grossly unremarkable. No solid / cystic mediastinal masses. The esophagus is nondistended precluding optimal assessment. No axillary, mediastinal or hilar lymphadenopathy by size criteria. Lungs/Pleura: The central tracheo-bronchial tree is patent. Redemonstration of moderate-to-large right pleural effusion, unchanged. There is small left pleural effusion, which has decreased since the prior study. There are mild centrilobular emphysematous changes throughout bilateral lungs with upper lobe  predominance. Redemonstration of multiple, ground-glass and solid opacities throughout bilateral lungs with irregular margins. When compared to the prior study dating back to July 18, 2022, no significant interval change. However, note is made of a pleural-based solid nodule in the middle lobe which has increased in size. The nodule currently measures 6.8 x 8.4 mm, measured up to 4.4 x 4.6 mm on the prior study from July 18, 2022 and 5.0 x 6.5 on the prior study from 10/13/2022. No new or suspicious lung nodules. Musculoskeletal: A CT Port-a-Cath is seen in the right upper chest wall with the catheter terminating in the cavo-atrial junction region. Redemonstration of multiple soft  tissue attenuation structures centered in the subcutaneous fat over the chest wall with largest along the left lower anterior chest wall measuring up to 1.5 x 2.6 cm. These are indeterminate but favored to represent epidermal inclusion cysts. No significant interval change. There is stable intramuscular lipoma along the left upper anterolateral chest wall. Visualized soft tissues of the chest wall are otherwise grossly unremarkable. No suspicious osseous lesions. Partially seen lower cervical spinal fixation hardware. CT ABDOMEN PELVIS FINDINGS Hepatobiliary: The liver is normal in size. Non-cirrhotic configuration. No suspicious mass. No intrahepatic or extrahepatic bile duct dilation. No calcified gallstones. Normal gallbladder wall thickness. No pericholecystic inflammatory changes. Pancreas: Unremarkable. No pancreatic ductal dilatation or surrounding inflammatory changes. Spleen: Within normal limits. No focal lesion. Adrenals/Urinary Tract: Adrenal glands are unremarkable. No suspicious renal mass. No hydronephrosis. No renal or ureteric calculi. Urinary bladder is under distended, precluding optimal assessment. However, no large mass or stones identified. There is mild-to-moderate diffuse circumferential wall thickening,  grossly similar to the prior study. Findings are likely accentuated by underdistention. No perivesical fat stranding. Stomach/Bowel: No disproportionate dilation of the small or large bowel loops. No evidence of abnormal bowel wall thickening or inflammatory changes. The appendix is unremarkable. Vascular/Lymphatic: Small-to-moderate ascites, increased since the prior study. No pneumoperitoneum. No abdominal or pelvic lymphadenopathy, by size criteria. No aneurysmal dilation of the major abdominal arteries. There are moderate peripheral atherosclerotic vascular calcifications of the aorta and its major branches. Reproductive: Normal size prostate. Symmetric seminal vesicles. Other: There is a tiny fat containing umbilical hernia. The soft tissues and abdominal wall are otherwise unremarkable. Musculoskeletal: No suspicious osseous lesions. There are mild multilevel degenerative changes in the visualized spine. IMPRESSION: 1. Interval increase in size of a pleural-based solid nodule in the middle lobe measuring up to 6.8 x 8.4 mm, measured up to 4.4 x 4.6 mm on the prior study from 07/18/2022 and 5.0 x 6.5 on the prior study from 10/13/2022. This is compatible with progressive metastatic disease. 2. Stable moderate-to-large right pleural effusion. Decreased small left pleural effusion. 3. Stable multiple ground-glass and solid opacities throughout bilateral lungs with irregular margins. 4. Small-to-moderate ascites, increased since the prior study. 5. Multiple other nonacute observations, as described above. Aortic Atherosclerosis (ICD10-I70.0) and Emphysema (ICD10-J43.9). Electronically Signed   By: Jules Schick M.D.   On: 01/19/2023 08:41   CT Abdomen Pelvis Wo Contrast  Result Date: 01/19/2023 CLINICAL DATA:  Non-small cell lung cancer (NSCLC), staging * Tracking Code: BO * EXAM: CT CHEST, ABDOMEN AND PELVIS WITHOUT CONTRAST TECHNIQUE: Multidetector CT imaging of the chest, abdomen and pelvis was performed  following the standard protocol without IV contrast. RADIATION DOSE REDUCTION: This exam was performed according to the departmental dose-optimization program which includes automated exposure control, adjustment of the mA and/or kV according to patient size and/or use of iterative reconstruction technique. COMPARISON:  CT scan chest, abdomen and pelvis from 10/13/2022. FINDINGS: CT CHEST FINDINGS Cardiovascular: Normal cardiac size. Mild pericardial effusion. No aortic aneurysm. Mediastinum/Nodes: Visualized thyroid gland appears grossly unremarkable. No solid / cystic mediastinal masses. The esophagus is nondistended precluding optimal assessment. No axillary, mediastinal or hilar lymphadenopathy by size criteria. Lungs/Pleura: The central tracheo-bronchial tree is patent. Redemonstration of moderate-to-large right pleural effusion, unchanged. There is small left pleural effusion, which has decreased since the prior study. There are mild centrilobular emphysematous changes throughout bilateral lungs with upper lobe predominance. Redemonstration of multiple, ground-glass and solid opacities throughout bilateral lungs with irregular margins. When compared to the prior study dating  back to July 18, 2022, no significant interval change. However, note is made of a pleural-based solid nodule in the middle lobe which has increased in size. The nodule currently measures 6.8 x 8.4 mm, measured up to 4.4 x 4.6 mm on the prior study from July 18, 2022 and 5.0 x 6.5 on the prior study from 10/13/2022. No new or suspicious lung nodules. Musculoskeletal: A CT Port-a-Cath is seen in the right upper chest wall with the catheter terminating in the cavo-atrial junction region. Redemonstration of multiple soft tissue attenuation structures centered in the subcutaneous fat over the chest wall with largest along the left lower anterior chest wall measuring up to 1.5 x 2.6 cm. These are indeterminate but favored to represent  epidermal inclusion cysts. No significant interval change. There is stable intramuscular lipoma along the left upper anterolateral chest wall. Visualized soft tissues of the chest wall are otherwise grossly unremarkable. No suspicious osseous lesions. Partially seen lower cervical spinal fixation hardware. CT ABDOMEN PELVIS FINDINGS Hepatobiliary: The liver is normal in size. Non-cirrhotic configuration. No suspicious mass. No intrahepatic or extrahepatic bile duct dilation. No calcified gallstones. Normal gallbladder wall thickness. No pericholecystic inflammatory changes. Pancreas: Unremarkable. No pancreatic ductal dilatation or surrounding inflammatory changes. Spleen: Within normal limits. No focal lesion. Adrenals/Urinary Tract: Adrenal glands are unremarkable. No suspicious renal mass. No hydronephrosis. No renal or ureteric calculi. Urinary bladder is under distended, precluding optimal assessment. However, no large mass or stones identified. There is mild-to-moderate diffuse circumferential wall thickening, grossly similar to the prior study. Findings are likely accentuated by underdistention. No perivesical fat stranding. Stomach/Bowel: No disproportionate dilation of the small or large bowel loops. No evidence of abnormal bowel wall thickening or inflammatory changes. The appendix is unremarkable. Vascular/Lymphatic: Small-to-moderate ascites, increased since the prior study. No pneumoperitoneum. No abdominal or pelvic lymphadenopathy, by size criteria. No aneurysmal dilation of the major abdominal arteries. There are moderate peripheral atherosclerotic vascular calcifications of the aorta and its major branches. Reproductive: Normal size prostate. Symmetric seminal vesicles. Other: There is a tiny fat containing umbilical hernia. The soft tissues and abdominal wall are otherwise unremarkable. Musculoskeletal: No suspicious osseous lesions. There are mild multilevel degenerative changes in the visualized  spine. IMPRESSION: 1. Interval increase in size of a pleural-based solid nodule in the middle lobe measuring up to 6.8 x 8.4 mm, measured up to 4.4 x 4.6 mm on the prior study from 07/18/2022 and 5.0 x 6.5 on the prior study from 10/13/2022. This is compatible with progressive metastatic disease. 2. Stable moderate-to-large right pleural effusion. Decreased small left pleural effusion. 3. Stable multiple ground-glass and solid opacities throughout bilateral lungs with irregular margins. 4. Small-to-moderate ascites, increased since the prior study. 5. Multiple other nonacute observations, as described above. Aortic Atherosclerosis (ICD10-I70.0) and Emphysema (ICD10-J43.9). Electronically Signed   By: Jules Schick M.D.   On: 01/19/2023 08:41        IMPRESSION/PLAN: 1. Stage IV non-small cell lung cancer, adenocarcinoma involving multifocal disease in the lung and brain metastases with new solitary nodule in the RML. Dr. Mitzi Hansen has reviewed the patient's recent imaging. He and I have discussed Mr. Sciandra case and he appears to be a good candidate for stereotactic body radiotherapy (SBRT) to the RML nodule since this is either an isolated metastasis versus metachronous Stage IA lung cancer. We discussed the risks, benefits, short, and long term effects of radiotherapy, as well as the curative intent, and the patient is interested in proceeding. We discussed the delivery and  logistics of radiotherapy and that Dr. Mitzi Hansen recommends 3-5 fractions of SBRT radiotherapy. The patient will be contacted to coordinate treatment planning by our simulation department. He will sign written consent at the time of simulation. He will also be due for brain imaging next month. 2. Right parotid pleomorphic adenoma. While his biopsy in 2023 was benign, he will see Dr. Marene Lenz on an annual basis due to of malignant transformation.       This encounter was conducted via telephone.  The patient has provided two factor  identification and has given verbal consent for this type of encounter and has been advised to only accept a meeting of this type in a secure network environment. The time spent during this encounter was 30 minutes including preparation, discussion, and coordination of the patient's care. The attendants for this meeting include  Ronny Bacon  and Earl Lites and Reinaldo Raddle.  During the encounter,  Ronny Bacon were located at [facility] Radiation Oncology Department.  Prabhjot AXELL GOODINE was located at home with his wife Degan Mccrery.       Osker Mason, PAC

## 2023-01-23 ENCOUNTER — Ambulatory Visit
Admission: RE | Admit: 2023-01-23 | Discharge: 2023-01-23 | Disposition: A | Discharge status: Home / Self Care | Payer: Medicare Other | Source: Ambulatory Visit | Attending: Radiation Oncology | Admitting: Radiation Oncology

## 2023-01-23 ENCOUNTER — Other Ambulatory Visit: Payer: Self-pay

## 2023-01-23 DIAGNOSIS — Z510 Encounter for antineoplastic radiation therapy: Secondary | ICD-10-CM | POA: Insufficient documentation

## 2023-01-23 DIAGNOSIS — C342 Malignant neoplasm of middle lobe, bronchus or lung: Secondary | ICD-10-CM | POA: Insufficient documentation

## 2023-01-23 DIAGNOSIS — Z87891 Personal history of nicotine dependence: Secondary | ICD-10-CM | POA: Diagnosis not present

## 2023-01-23 DIAGNOSIS — C3481 Malignant neoplasm of overlapping sites of right bronchus and lung: Secondary | ICD-10-CM | POA: Diagnosis not present

## 2023-01-23 DIAGNOSIS — C7931 Secondary malignant neoplasm of brain: Secondary | ICD-10-CM | POA: Insufficient documentation

## 2023-01-29 ENCOUNTER — Encounter (INDEPENDENT_AMBULATORY_CARE_PROVIDER_SITE_OTHER): Payer: Self-pay

## 2023-02-05 DIAGNOSIS — C7931 Secondary malignant neoplasm of brain: Secondary | ICD-10-CM | POA: Diagnosis not present

## 2023-02-05 DIAGNOSIS — C342 Malignant neoplasm of middle lobe, bronchus or lung: Secondary | ICD-10-CM | POA: Diagnosis not present

## 2023-02-05 DIAGNOSIS — Z510 Encounter for antineoplastic radiation therapy: Secondary | ICD-10-CM | POA: Diagnosis not present

## 2023-02-05 DIAGNOSIS — C3481 Malignant neoplasm of overlapping sites of right bronchus and lung: Secondary | ICD-10-CM | POA: Diagnosis not present

## 2023-02-05 DIAGNOSIS — Z87891 Personal history of nicotine dependence: Secondary | ICD-10-CM | POA: Diagnosis not present

## 2023-02-10 ENCOUNTER — Ambulatory Visit
Admission: RE | Admit: 2023-02-10 | Discharge: 2023-02-10 | Disposition: A | Discharge status: Home / Self Care | Payer: Medicare Other | Source: Ambulatory Visit | Attending: Radiation Oncology | Admitting: Radiation Oncology

## 2023-02-10 ENCOUNTER — Other Ambulatory Visit: Payer: Self-pay

## 2023-02-10 DIAGNOSIS — C342 Malignant neoplasm of middle lobe, bronchus or lung: Secondary | ICD-10-CM | POA: Diagnosis not present

## 2023-02-10 DIAGNOSIS — Z87891 Personal history of nicotine dependence: Secondary | ICD-10-CM | POA: Diagnosis not present

## 2023-02-10 DIAGNOSIS — C7931 Secondary malignant neoplasm of brain: Secondary | ICD-10-CM | POA: Diagnosis not present

## 2023-02-10 DIAGNOSIS — Z510 Encounter for antineoplastic radiation therapy: Secondary | ICD-10-CM | POA: Diagnosis not present

## 2023-02-10 LAB — RAD ONC ARIA SESSION SUMMARY
Course Elapsed Days: 0
Plan Fractions Treated to Date: 1
Plan Prescribed Dose Per Fraction: 18 Gy
Plan Total Fractions Prescribed: 3
Plan Total Prescribed Dose: 54 Gy
Reference Point Dosage Given to Date: 18 Gy
Reference Point Session Dosage Given: 18 Gy
Session Number: 1

## 2023-02-11 ENCOUNTER — Ambulatory Visit: Payer: Medicare Other | Admitting: Radiation Oncology

## 2023-02-12 ENCOUNTER — Ambulatory Visit
Admission: RE | Admit: 2023-02-12 | Discharge: 2023-02-12 | Disposition: A | Discharge status: Home / Self Care | Payer: Medicare Other | Source: Ambulatory Visit | Attending: Radiation Oncology | Admitting: Radiation Oncology

## 2023-02-12 ENCOUNTER — Other Ambulatory Visit: Payer: Self-pay

## 2023-02-12 ENCOUNTER — Ambulatory Visit: Payer: Medicare Other

## 2023-02-12 DIAGNOSIS — C7931 Secondary malignant neoplasm of brain: Secondary | ICD-10-CM | POA: Diagnosis not present

## 2023-02-12 DIAGNOSIS — Z510 Encounter for antineoplastic radiation therapy: Secondary | ICD-10-CM | POA: Diagnosis not present

## 2023-02-12 DIAGNOSIS — Z87891 Personal history of nicotine dependence: Secondary | ICD-10-CM | POA: Diagnosis not present

## 2023-02-12 DIAGNOSIS — C342 Malignant neoplasm of middle lobe, bronchus or lung: Secondary | ICD-10-CM | POA: Diagnosis not present

## 2023-02-12 LAB — RAD ONC ARIA SESSION SUMMARY
Course Elapsed Days: 2
Plan Fractions Treated to Date: 2
Plan Prescribed Dose Per Fraction: 18 Gy
Plan Total Fractions Prescribed: 3
Plan Total Prescribed Dose: 54 Gy
Reference Point Dosage Given to Date: 36 Gy
Reference Point Session Dosage Given: 18 Gy
Session Number: 2

## 2023-02-12 NOTE — Progress Notes (Signed)
  Radiation Oncology         (336) [phone] ________________________________  Name: Nicolas Allen MRN: [id number]  Date: 02/12/2023  DOB: Jan 11, 1956  End of Treatment Note  Diagnosis:   Non-small cell lung cancer     Indication for treatment:  Curative       Radiation treatment dates:   02/10/23-02/17/23  Site/dose:   The tumor in the RML was treated with a course of stereotactic body radiation treatment. The patient will complete his therapy next week and will receive a total of 54 Gy In 3 fractions at 18 G per fraction.  Narrative: The patient tolerated radiation treatment relatively well.   The patient did not have any signs of acute toxicity during treatment.  Plan: The patient will receive a call in about one month from the radiation oncology department. He will continue follow up with Dr. Arbutus Ped as well.      Osker Mason, PAC

## 2023-02-17 ENCOUNTER — Encounter: Payer: Self-pay | Admitting: Radiation Oncology

## 2023-02-17 ENCOUNTER — Ambulatory Visit
Admission: RE | Admit: 2023-02-17 | Discharge: 2023-02-17 | Disposition: A | Discharge status: Home / Self Care | Payer: Medicare Other | Source: Ambulatory Visit | Attending: Radiation Oncology | Admitting: Radiation Oncology

## 2023-02-17 ENCOUNTER — Other Ambulatory Visit: Payer: Self-pay

## 2023-02-17 DIAGNOSIS — C3481 Malignant neoplasm of overlapping sites of right bronchus and lung: Secondary | ICD-10-CM | POA: Diagnosis not present

## 2023-02-17 DIAGNOSIS — R911 Solitary pulmonary nodule: Secondary | ICD-10-CM | POA: Insufficient documentation

## 2023-02-17 DIAGNOSIS — Z510 Encounter for antineoplastic radiation therapy: Secondary | ICD-10-CM | POA: Diagnosis not present

## 2023-02-17 DIAGNOSIS — C7931 Secondary malignant neoplasm of brain: Secondary | ICD-10-CM | POA: Insufficient documentation

## 2023-02-17 DIAGNOSIS — Z87891 Personal history of nicotine dependence: Secondary | ICD-10-CM | POA: Diagnosis not present

## 2023-02-17 LAB — RAD ONC ARIA SESSION SUMMARY
Course Elapsed Days: 7
Plan Fractions Treated to Date: 3
Plan Prescribed Dose Per Fraction: 18 Gy
Plan Total Fractions Prescribed: 3
Plan Total Prescribed Dose: 54 Gy
Reference Point Dosage Given to Date: 54 Gy
Reference Point Session Dosage Given: 18 Gy
Session Number: 3

## 2023-02-18 NOTE — Radiation Completion Notes (Addendum)
  Radiation Oncology         [phone] ________________________________  Name: Nicolas Allen MRN: [id number]  Date of Service: 02/17/2023  DOB: 07/28/1955  End of Treatment Note    Diagnosis: Stage IV non-small cell lung cancer, adenocarcinoma involving multifocal disease in the lung and brain metastases with new solitary nodule in the RML.    Intent: Curative     ==========DELIVERED PLANS==========  First Treatment Date: 2023-02-10 - Last Treatment Date: 2023-02-17   Plan Name: Lung_R_SBRT Site: Lung, RML Technique: SBRT/SRT-IMRT Mode: Photon Dose Per Fraction: 18 Gy Prescribed Dose (Delivered / Prescribed): 54 Gy / 54 Gy Prescribed Fxs (Delivered / Prescribed): 3 / 3     ==========ON TREATMENT VISIT DATES========== 2023-02-10, 2023-02-12, 2023-02-17  See weekly On Treatment Notes in Epic for details. The patient tolerated radiation. He developed fatigue and anticipated skin changes in the treatment field.   The patient will receive a call in about one month from the radiation oncology department. He will continue follow up with Dr. Arbutus Ped as well as be followed with upcoming MRI later this month for his brain surveillance.      Osker Mason, PAC

## 2023-02-20 ENCOUNTER — Other Ambulatory Visit (INDEPENDENT_AMBULATORY_CARE_PROVIDER_SITE_OTHER): Payer: Medicare Other

## 2023-02-20 DIAGNOSIS — E559 Vitamin D deficiency, unspecified: Secondary | ICD-10-CM | POA: Diagnosis not present

## 2023-02-20 DIAGNOSIS — E538 Deficiency of other specified B group vitamins: Secondary | ICD-10-CM

## 2023-02-20 DIAGNOSIS — R7302 Impaired glucose tolerance (oral): Secondary | ICD-10-CM

## 2023-02-20 DIAGNOSIS — Z125 Encounter for screening for malignant neoplasm of prostate: Secondary | ICD-10-CM

## 2023-02-20 LAB — URINALYSIS, ROUTINE W REFLEX MICROSCOPIC
Bilirubin Urine: NEGATIVE
Ketones, ur: NEGATIVE
Leukocytes,Ua: NEGATIVE
Nitrite: NEGATIVE
Specific Gravity, Urine: 1.02 (ref 1.000–1.030)
Total Protein, Urine: 100 — AB
Urine Glucose: NEGATIVE
Urobilinogen, UA: 0.2 (ref 0.0–1.0)
pH: 6 (ref 5.0–8.0)

## 2023-02-20 LAB — PSA: PSA: 0.34 ng/mL (ref 0.10–4.00)

## 2023-02-20 LAB — MICROALBUMIN / CREATININE URINE RATIO
Creatinine,U: 156.5 mg/dL
Microalb Creat Ratio: 5.6 mg/g (ref 0.0–30.0)
Microalb, Ur: 8.8 mg/dL — ABNORMAL HIGH (ref 0.0–1.9)

## 2023-02-20 LAB — LIPID PANEL
Cholesterol: 178 mg/dL (ref 0–200)
HDL: 57.9 mg/dL (ref >39.00)
LDL Cholesterol: 104 mg/dL — ABNORMAL HIGH (ref 0–99)
NonHDL: 120.4
Total CHOL/HDL Ratio: 3
Triglycerides: 84 mg/dL (ref 0.0–149.0)
VLDL: 16.8 mg/dL (ref 0.0–40.0)

## 2023-02-20 LAB — VITAMIN B12: Vitamin B-12: 496 pg/mL (ref 211–911)

## 2023-02-20 LAB — TSH: TSH: 0.78 u[IU]/mL (ref 0.35–5.50)

## 2023-02-20 LAB — VITAMIN D 25 HYDROXY (VIT D DEFICIENCY, FRACTURES): VITD: 63.96 ng/mL (ref 30.00–100.00)

## 2023-02-20 LAB — HEMOGLOBIN A1C: Hgb A1c MFr Bld: 6.3 % (ref 4.6–6.5)

## 2023-02-23 ENCOUNTER — Other Ambulatory Visit: Payer: Medicare Other

## 2023-02-24 ENCOUNTER — Encounter: Payer: Self-pay | Admitting: Internal Medicine

## 2023-02-24 ENCOUNTER — Ambulatory Visit (INDEPENDENT_AMBULATORY_CARE_PROVIDER_SITE_OTHER): Payer: Medicare Other | Admitting: Internal Medicine

## 2023-02-24 ENCOUNTER — Ambulatory Visit: Payer: Self-pay | Admitting: Radiation Oncology

## 2023-02-24 VITALS — BP 124/76 | HR 81 | Temp 98.3°F | Ht 72.0 in | Wt 160.0 lb

## 2023-02-24 DIAGNOSIS — R202 Paresthesia of skin: Secondary | ICD-10-CM

## 2023-02-24 DIAGNOSIS — E538 Deficiency of other specified B group vitamins: Secondary | ICD-10-CM

## 2023-02-24 DIAGNOSIS — E559 Vitamin D deficiency, unspecified: Secondary | ICD-10-CM | POA: Diagnosis not present

## 2023-02-24 DIAGNOSIS — R3129 Other microscopic hematuria: Secondary | ICD-10-CM | POA: Diagnosis not present

## 2023-02-24 DIAGNOSIS — E7800 Pure hypercholesterolemia, unspecified: Secondary | ICD-10-CM

## 2023-02-24 DIAGNOSIS — R7302 Impaired glucose tolerance (oral): Secondary | ICD-10-CM | POA: Diagnosis not present

## 2023-02-24 DIAGNOSIS — I10 Essential (primary) hypertension: Secondary | ICD-10-CM

## 2023-02-24 DIAGNOSIS — Z125 Encounter for screening for malignant neoplasm of prostate: Secondary | ICD-10-CM

## 2023-02-24 NOTE — Progress Notes (Signed)
Patient ID: Nicolas Allen, male   DOB: February 04, 1956, 67 y.o.   MRN: [id number]        Chief Complaint: follow up right hand 4th finger paresthesia, microhematuria, low b12, htn, hld, hyperglycemia, low vit d       HPI:  Nicolas Allen is a 66 y.o. male here with c/o 1 wk onset right hand 4th finger tingling constant without pain, weakness, or trauma.  Pt denies chest pain, increased sob or doe, wheezing, orthopnea, PND, increased LE swelling, palpitations, dizziness or syncope.   Pt denies polydipsia, polyuria, or new focal neuro s/s.    Pt denies fever, wt loss, night sweats, loss of appetite, or other constitutional symptoms  Incidentally has brain mri scheduled sept 25.  Lost approx 10lbs in past yr.  Denies urinary symptoms such as dysuria, frequency, urgency, flank pain, hematuria or n/v, fever, chills.    Wt Readings from Last 3 Encounters:  02/26/23 160 lb (72.6 kg)  02/24/23 160 lb (72.6 kg)  02/12/23 159 lb 12.8 oz (72.5 kg)   BP Readings from Last 3 Encounters:  02/24/23 124/76  02/12/23 101/63  01/19/23 99/64         Past Medical History:  Diagnosis Date   Anemia 06/24/2012   ANXIETY 02/03/2007   Cervical disc disease 02/12/2012   S/p surgury jan 2013   Erectile dysfunction 02/11/2011   GERD (gastroesophageal reflux disease)    GLUCOSE INTOLERANCE 01/04/2008   HYPERLIPIDEMIA 02/03/2007   HYPERTENSION 02/03/2007   IBS (irritable bowel syndrome)    Impaired glucose tolerance 02/11/2011   nscl ca 03/30/2020   Smoker 08/22/2014   Substance abuse (HCC) 2001   sober for 16 yrs   Past Surgical History:  Procedure Laterality Date   BRONCHIAL BIOPSY  03/30/2020   Procedure: BRONCHIAL BIOPSIES;  Surgeon: Josephine Igo, DO;  Location: [facility];  Service: Pulmonary;;   BRONCHIAL BRUSHINGS  03/30/2020   Procedure: BRONCHIAL BRUSHINGS;  Surgeon: Josephine Igo, DO;  Location: [facility];  Service: Pulmonary;;   BRONCHIAL NEEDLE ASPIRATION BIOPSY  03/30/2020    Procedure: BRONCHIAL NEEDLE ASPIRATION BIOPSIES;  Surgeon: Josephine Igo, DO;  Location: [facility];  Service: Pulmonary;;   BRONCHIAL WASHINGS  03/30/2020   Procedure: BRONCHIAL WASHINGS;  Surgeon: Josephine Igo, DO;  Location: [facility];  Service: Pulmonary;;   COLONOSCOPY  2007   IR IMAGING GUIDED PORT INSERTION  04/23/2020   neck fusion  2012   C4   NO PAST SURGERIES     VIDEO BRONCHOSCOPY WITH ENDOBRONCHIAL NAVIGATION N/A 03/30/2020   Procedure: VIDEO BRONCHOSCOPY WITH ENDOBRONCHIAL NAVIGATION;  Surgeon: Josephine Igo, DO;  Location: [facility];  Service: Pulmonary;  Laterality: N/A;   VIDEO BRONCHOSCOPY WITH ENDOBRONCHIAL ULTRASOUND N/A 03/30/2020   Procedure: VIDEO BRONCHOSCOPY WITH ENDOBRONCHIAL ULTRASOUND;  Surgeon: Josephine Igo, DO;  Location: [facility];  Service: Pulmonary;  Laterality: N/A;    reports that he quit smoking about 3 years ago. His smoking use included cigarettes. He started smoking about 33 years ago. He has a 45 pack-year smoking history. He has quit using smokeless tobacco.  His smokeless tobacco use included chew. He reports that he does not drink alcohol and does not use drugs. family history includes Cancer in his cousin and mother; Hypertension in an other family member; Stroke in an other family member. No Known Allergies Current Outpatient Medications on File Prior to Visit  Medication Sig Dispense Refill   amLODipine-benazepril (LOTREL) 10-20 MG capsule Take 1  capsule by mouth once daily 90 capsule 3   aspirin 325 MG EC tablet Take 1 tablet (325 mg total) by mouth daily. 90 tablet 99   b complex vitamins tablet Take 1 tablet by mouth daily.     cholecalciferol (VITAMIN D3) 25 MCG (1000 UNIT) tablet Take 1,000 Units by mouth daily.     folic acid (FOLVITE) 1 MG tablet Take 1 tablet by mouth once daily 90 tablet 3   Garlic 1000 MG CAPS Take 1,000 mg by mouth daily.      lidocaine-prilocaine (EMLA) cream Apply to the Port-A-Cath site 30-60  minutes before chemotherapy. 30 g 0   Omega-3 Fatty Acids (FISH OIL PO) Take 1 capsule by mouth daily.      polyethylene glycol (MIRALAX / GLYCOLAX) 17 g packet Take 17 g by mouth daily as needed.     prochlorperazine (COMPAZINE) 10 MG tablet Take 1 tablet (10 mg total) by mouth every 6 (six) hours as needed for nausea or vomiting. 30 tablet 0   rosuvastatin (CRESTOR) 40 MG tablet Take 1 tablet (40 mg total) by mouth daily. 90 tablet 3   vardenafil (LEVITRA) 20 MG tablet TAKE 1 TABLET BY MOUTH AS NEEDED FOR  ERECTILE  DYSFUNCTION 10 tablet 5   No current facility-administered medications on file prior to visit.        ROS:  All others reviewed and negative.  Objective        PE:  BP 124/76 (BP Location: Right Arm, Patient Position: Sitting, Cuff Size: Normal)   Pulse 81   Temp 98.3 F (36.8 C) (Oral)   Ht 6' (1.829 m)   Wt 160 lb (72.6 kg)   SpO2 98%   BMI 21.70 kg/m                 Constitutional: Pt appears in NAD               HENT: Head: NCAT.                Right Ear: External ear normal.                 Left Ear: External ear normal.                Eyes: . Pupils are equal, round, and reactive to light. Conjunctivae and EOM are normal               Nose: without d/c or deformity               Neck: Neck supple. Gross normal ROM               Cardiovascular: Normal rate and regular rhythm.                 Pulmonary/Chest: Effort normal and breath sounds without rales or wheezing.                Abd:  Soft, NT, ND, + BS, no organomegaly               Neurological: Pt is alert. At baseline orientation, motor grossly intact               Skin: Skin is warm. No rashes, no other new lesions, LE edema - none               Psychiatric: Pt behavior is normal without agitation   Micro: none  Cardiac tracings I have personally interpreted  today:  none  Pertinent Radiological findings (summarize): none   Lab Results  Component Value Date   WBC 3.9 (L) 01/19/2023   HGB 11.0 (L)  01/19/2023   HCT 32.2 (L) 01/19/2023   PLT 150 01/19/2023   GLUCOSE 86 01/19/2023   CHOL 178 02/20/2023   TRIG 84.0 02/20/2023   HDL 57.90 02/20/2023   LDLCALC 104 (H) 02/20/2023   ALT 8 01/19/2023   AST 18 01/19/2023   NA 139 01/19/2023   K 3.9 01/19/2023   CL 108 01/19/2023   CREATININE 2.08 (H) 01/19/2023   BUN 23 01/19/2023   CO2 26 01/19/2023   TSH 0.78 02/20/2023   PSA 0.34 02/20/2023   INR 1.0 03/30/2020   HGBA1C 6.3 02/20/2023   MICROALBUR 8.8 (H) 02/20/2023   Assessment/Plan:  Nicolas Allen is a 67 y.o. Black or African American [2] male with  has a past medical history of Anemia (06/24/2012), ANXIETY (02/03/2007), Cervical disc disease (02/12/2012), Erectile dysfunction (02/11/2011), GERD (gastroesophageal reflux disease), GLUCOSE INTOLERANCE (01/04/2008), HYPERLIPIDEMIA (02/03/2007), HYPERTENSION (02/03/2007), IBS (irritable bowel syndrome), Impaired glucose tolerance (02/11/2011), nscl ca (03/30/2020), Smoker (08/22/2014), and Substance abuse (HCC) (2001).  B12 deficiency Lab Results  Component Value Date   VITAMINB12 496 02/20/2023   Stable, cont oral replacement - b12 1000 mcg qd   Essential hypertension BP Readings from Last 3 Encounters:  02/24/23 124/76  02/12/23 101/63  01/19/23 99/64   Stable, pt to continue medical treatment lotrel 10-20 qd   Hyperlipidemia Lab Results  Component Value Date   LDLCALC 104 (H) 02/20/2023   Uncontrolled, goal ldl < 70, pt to continue current statin crestor 40 mg every day, declines add zetia 10 for now, also for lower chol diet   Impaired glucose tolerance Lab Results  Component Value Date   HGBA1C 6.3 02/20/2023   Stable, pt to continue current medical treatment  - diet,wt control   Vitamin D deficiency Last vitamin D Lab Results  Component Value Date   VD25OH 63.96 02/20/2023   Stable, cont oral replacement   Paresthesia 4th finger right hand - cont asa 81 every day, for f/u mri sept 25 -  declines mri sooner or neuro referral for now,  to f/u any worsening symptoms or concerns  Microhematuria Small on ua, asympt, declines urology for now  Followup: Return in about 6 months (around 08/24/2023).  Oliver Barre, MD 02/27/2023 8:55 PM [facility]

## 2023-02-24 NOTE — Patient Instructions (Signed)
Please call if you change your mind about referral to Urology for the small blood in the urine, to have the bladder checked  Please continue all other medications as before, and refills have been done if requested.  Please have the pharmacy call with any other refills you may need.  Please continue your efforts at being more active, low cholesterol diet, and weight control.  You are otherwise up to date with prevention measures today.  Please keep your appointments with your specialists as you may have planned - the brain MRI sept 25, and the CT scans, and Oncology as you have planned  Please make an Appointment to return in 6 months, or sooner if needed, also with Lab Appointment for testing done 3-5 days before at the FIRST FLOOR Lab (so this is for TWO appointments - please see the scheduling desk as you leave)

## 2023-02-26 ENCOUNTER — Ambulatory Visit (INDEPENDENT_AMBULATORY_CARE_PROVIDER_SITE_OTHER): Payer: Medicare Other

## 2023-02-26 VITALS — Ht 72.0 in | Wt 160.0 lb

## 2023-02-26 DIAGNOSIS — Z0000 Encounter for general adult medical examination without abnormal findings: Secondary | ICD-10-CM | POA: Diagnosis not present

## 2023-02-26 NOTE — Progress Notes (Signed)
Subjective:   Nicolas Allen is a 67 y.o. male who presents for Medicare Annual/Subsequent preventive examination.  Visit Complete: Virtual  I connected with  Earl Lites on 02/26/23 by a audio enabled telemedicine application and verified that I am speaking with the correct person using two identifiers.  Patient Location: Home  Provider Location: Office/Clinic  I discussed the limitations of evaluation and management by telemedicine. The patient expressed understanding and agreed to proceed.  Vital Signs: Unable to obtain new vitals due to this being a telehealth visit.   Review of Systems    Cardiac Risk Factors include: advanced age (>30men, >77 women);hypertension;dyslipidemia;Other (see comment), Risk factor comments: TIA     Objective:    Today's Vitals   02/26/23 1430  Weight: 160 lb (72.6 kg)  Height: 6' (1.829 m)   Body mass index is 21.7 kg/m.     02/26/2023    2:37 PM 01/20/2023    1:44 PM 10/16/2022   12:02 PM 08/18/2022    2:01 PM 05/20/2022   10:57 AM 04/08/2022   11:14 AM 02/04/2022   10:45 AM  Advanced Directives  Does Patient Have a Medical Advance Directive? Yes No No No No No No  Type of Estate agent of [place];Living will        Copy of Healthcare Power of Attorney in Chart? No - copy requested        Would patient like information on creating a medical advance directive?     No - Patient declined No - Patient declined No - Patient declined    Current Medications (verified) Outpatient Encounter Medications as of 02/26/2023  Medication Sig   amLODipine-benazepril (LOTREL) 10-20 MG capsule Take 1 capsule by mouth once daily   aspirin 325 MG EC tablet Take 1 tablet (325 mg total) by mouth daily.   b complex vitamins tablet Take 1 tablet by mouth daily.   cholecalciferol (VITAMIN D3) 25 MCG (1000 UNIT) tablet Take 1,000 Units by mouth daily.   folic acid (FOLVITE) 1 MG tablet Take 1 tablet by mouth once daily   Garlic 1000 MG  CAPS Take 1,000 mg by mouth daily.    lidocaine-prilocaine (EMLA) cream Apply to the Port-A-Cath site 30-60 minutes before chemotherapy.   Omega-3 Fatty Acids (FISH OIL PO) Take 1 capsule by mouth daily.    polyethylene glycol (MIRALAX / GLYCOLAX) 17 g packet Take 17 g by mouth daily as needed.   prochlorperazine (COMPAZINE) 10 MG tablet Take 1 tablet (10 mg total) by mouth every 6 (six) hours as needed for nausea or vomiting.   rosuvastatin (CRESTOR) 40 MG tablet Take 1 tablet (40 mg total) by mouth daily.   vardenafil (LEVITRA) 20 MG tablet TAKE 1 TABLET BY MOUTH AS NEEDED FOR  ERECTILE  DYSFUNCTION   No facility-administered encounter medications on file as of 02/26/2023.    Allergies (verified) Patient has no known allergies.   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   Past Surgical History:  Procedure Laterality Date  BRONCHIAL BIOPSY  03/30/2020   Procedure: BRONCHIAL BIOPSIES;  Surgeon: Josephine Igo, DO;  Location: [facility];  Service: Pulmonary;;   BRONCHIAL BRUSHINGS  03/30/2020   Procedure: BRONCHIAL BRUSHINGS;  Surgeon: Josephine Igo, DO;  Location: [facility];  Service: Pulmonary;;   BRONCHIAL NEEDLE ASPIRATION BIOPSY  03/30/2020   Procedure: BRONCHIAL NEEDLE ASPIRATION BIOPSIES;  Surgeon: Josephine Igo, DO;  Location: [facility];  Service: Pulmonary;;   BRONCHIAL WASHINGS  03/30/2020   Procedure: BRONCHIAL WASHINGS;  Surgeon: Josephine Igo, DO;  Location: [facility];  Service: Pulmonary;;   COLONOSCOPY  2007   IR  IMAGING GUIDED PORT INSERTION  04/23/2020   neck fusion  2012   C4   NO PAST SURGERIES     VIDEO BRONCHOSCOPY WITH ENDOBRONCHIAL NAVIGATION N/A 03/30/2020   Procedure: VIDEO BRONCHOSCOPY WITH ENDOBRONCHIAL NAVIGATION;  Surgeon: Josephine Igo, DO;  Location: [facility];  Service: Pulmonary;  Laterality: N/A;   VIDEO BRONCHOSCOPY WITH ENDOBRONCHIAL ULTRASOUND N/A 03/30/2020   Procedure: VIDEO BRONCHOSCOPY WITH ENDOBRONCHIAL ULTRASOUND;  Surgeon: Josephine Igo, DO;  Location: [facility];  Service: Pulmonary;  Laterality: N/A;   Family History  Problem Relation Age of Onset   Cancer Mother        esophagus   Cancer Cousin    Stroke Other    Hypertension Other    Colon cancer Neg Hx    Social History   Socioeconomic History   Marital status: Married    Spouse name: Lafonda Mosses   Number of children: Not on file   Years of education: Not on file   Highest education level: Not on file  Occupational History   Occupation: Copy  Tobacco Use   Smoking status: Former    Current packs/day: 0.00    Average packs/day: 1.5 packs/day for 30.0 years (45.0 ttl pk-yrs)    Types: Cigarettes    Start date: 02/21/1990    Quit date: 02/22/2020    Years since quitting: 3.0   Smokeless tobacco: Former    Types: Associate Professor status: Never Used  Substance and Sexual Activity   Alcohol use: No    Alcohol/week: 0.0 standard drinks of alcohol    Comment: quit 2000 prior heavy   Drug use: No   Sexual activity: Not on file  Other Topics Concern   Not on file  Social History Narrative   No children.   Social Determinants of Health   Financial Resource Strain: Low Risk  (02/26/2023)   Overall Financial Resource Strain (CARDIA)    Difficulty of Paying Living Expenses: Not very hard  Food Insecurity: No Food Insecurity (02/26/2023)   Hunger Vital Sign    Worried About Running Out of Food in the Last Year: Never true    Ran Out of Food in the Last Year: Never true  Transportation  Needs: No Transportation Needs (02/26/2023)   PRAPARE - Administrator, Civil Service (Medical): No    Lack of Transportation (Non-Medical): No  Physical Activity: Insufficiently Active (02/26/2023)   Exercise Vital Sign    Days of Exercise per Week: 5 days    Minutes of Exercise per Session: 20 min  Stress: No Stress Concern Present (02/26/2023)   [employer] of Occupational Health - Occupational Stress Questionnaire    Feeling of Stress : Not at all  Social Connections: Moderately Integrated (02/26/2023)   Social Connection and Isolation Panel [NHANES]    Frequency of Communication with Friends and Family:  More than three times a week    Frequency of Social Gatherings with Friends and Family: Once a week    Attends Religious Services: 1 to 4 times per year    Active Member of [employer] or Organizations: No    Attends Engineer, structural: Never    Marital Status: Married    Tobacco Counseling Counseling given: Not Answered   Clinical Intake:  Pre-visit preparation completed: Yes  Pain : No/denies pain     BMI - recorded: 21.7 Nutritional Status: BMI of 19-24  Normal Nutritional Risks: None Diabetes: No  How often do you need to have someone help you when you read instructions, pamphlets, or other written materials from your doctor or pharmacy?: 1 - Never  Interpreter Needed?: No  Information entered by :: Adena Sima, RMA   Activities of Daily Living    02/26/2023    2:32 PM  In your present state of health, do you have any difficulty performing the following activities:  Hearing? 0  Vision? 0  Difficulty concentrating or making decisions? 0  Walking or climbing stairs? 0  Dressing or bathing? 0  Doing errands, shopping? 0  Preparing Food and eating ? N  Using the Toilet? N  In the past six months, have you accidently leaked urine? N  Do you have problems with loss of bowel control? N  Managing your Medications? N  Managing your  Finances? N  Housekeeping or managing your Housekeeping? N    Patient Care Team: Corwin Levins, MD as PCP - General  Indicate any recent Medical Services you may have received from other than [facility] providers in the past year (date may be approximate).     Assessment:   This is a routine wellness examination for Jhalen.  Hearing/Vision screen Hearing Screening - Comments:: Denies hearing difficulties   Vision Screening - Comments:: Wears eyeglasses   Goals Addressed               This Visit's Progress     Patient Stated (pt-stated)        To continue to get around well.      Depression Screen    02/26/2023    2:42 PM 02/24/2023    8:19 AM 08/28/2022    8:49 AM 02/27/2022    8:06 AM 08/27/2021    8:19 AM 08/27/2021    8:03 AM 02/26/2021    8:29 AM  PHQ 2/9 Scores  PHQ - 2 Score 0 0 0 0 0 0 0  PHQ- 9 Score 0   2       Fall Risk    02/26/2023    2:37 PM 02/24/2023    8:19 AM 08/28/2022    8:49 AM 02/27/2022    8:06 AM 08/27/2021    8:19 AM  Fall Risk   Falls in the past year? 0 0 0 0 0  Number falls in past yr:  0 0  0  Injury with Fall?  0 0 0 0  Risk for fall due to : No Fall Risks No Fall Risks  No Fall Risks   Follow up Falls evaluation completed;Falls prevention discussed Falls evaluation completed  Falls evaluation completed     MEDICARE RISK AT HOME: Medicare Risk at Home Any stairs in or around the home?: Yes If so, are there any without handrails?: Yes Home free of loose throw rugs in walkways, pet beds, electrical cords, etc?: Yes Adequate lighting in your home to reduce risk of  falls?: Yes Life alert?: No Use of a cane, walker or w/c?: No Grab bars in the bathroom?: No Shower chair or bench in shower?: No Elevated toilet seat or a handicapped toilet?: No  TIMED UP AND GO:  Was the test performed?  No    Cognitive Function:        02/26/2023    2:38 PM  6CIT Screen  What Year? 0 points  What month? 0 points  What time? 0 points  Count  back from 20 0 points  Months in reverse 0 points  Repeat phrase 0 points  Total Score 0 points    Immunizations Immunization History  Administered Date(s) Administered   Fluad Quad(high Dose 65+) 02/27/2022   Influenza,inj,Quad PF,6+ Mos 06/30/2018, 02/24/2020, 02/26/2021   PFIZER(Purple Top)SARS-COV-2 Vaccination 09/02/2019, 09/27/2019, 04/09/2020   PNEUMOCOCCAL CONJUGATE-20 02/27/2022   Td 12/13/2008   Tdap 02/23/2019   Zoster Recombinant(Shingrix) 02/26/2021, 04/29/2021    TDAP status: Up to date  Flu Vaccine status: Due, Education has been provided regarding the importance of this vaccine. Advised may receive this vaccine at local pharmacy or Health Dept. Aware to provide a copy of the vaccination record if obtained from local pharmacy or Health Dept. Verbalized acceptance and understanding.  Pneumococcal vaccine status: Up to date  Covid-19 vaccine status: Information provided on how to obtain vaccines.   Qualifies for Shingles Vaccine? Yes   Zostavax completed Yes   Shingrix Completed?: Yes  Screening Tests Health Maintenance  Topic Date Due   COVID-19 Vaccine (4 - 2023-24 season) 03/12/2023 (Originally 02/15/2023)   INFLUENZA VACCINE  09/14/2023 (Originally 01/15/2023)   Medicare Annual Wellness (AWV)  02/26/2024   Colonoscopy  12/09/2025   DTaP/Tdap/Td (3 - Td or Tdap) 02/22/2029   Pneumonia Vaccine 62+ Years old  Completed   Hepatitis C Screening  Completed   Zoster Vaccines- Shingrix  Completed   HPV VACCINES  Aged Out    Health Maintenance  There are no preventive care reminders to display for this patient.   Colorectal cancer screening: Type of screening: Colonoscopy. Completed 12/10/2015. Repeat every 10 years  Lung Cancer Screening: (Low Dose CT Chest recommended if Age 1-80 years, 20 pack-year currently smoking OR have quit w/in 15years.) does qualify.   Lung Cancer Screening Referral: Appointment on 03/11/2023  Additional Screening:  Hepatitis C  Screening: does qualify; Completed 09/22/2016  Vision Screening: Recommended annual ophthalmology exams for early detection of glaucoma and other disorders of the eye. Is the patient up to date with their annual eye exam?  No  Who is the provider or what is the name of the office in which the patient attends annual eye exams? N/A If pt is not established with a provider, would they like to be referred to a provider to establish care? Yes .   Dental Screening: Recommended annual dental exams for proper oral hygiene   Community Resource Referral / Chronic Care Management: CRR required this visit?  No   CCM required this visit?  No     Plan:     I have personally reviewed and noted the following in the patient's chart:   Medical and social history Use of alcohol, tobacco or illicit drugs  Current medications and supplements including opioid prescriptions. Patient is not currently taking opioid prescriptions. Functional ability and status Nutritional status Physical activity Advanced directives List of other physicians Hospitalizations, surgeries, and ER visits in previous 12 months Vitals Screenings to include cognitive, depression, and falls Referrals and appointments  In  addition, I have reviewed and discussed with patient certain preventive protocols, quality metrics, and best practice recommendations. A written personalized care plan for preventive services as well as general preventive health recommendations were provided to patient.     Jenavieve Freda L Eryka Dolinger, CMA   02/26/2023   After Visit Summary: (MyChart) Due to this being a telephonic visit, the after visit summary with patients personalized plan was offered to patient via MyChart   Nurse Notes: Patient is due for his Flu vaccine.  He is up to date on everything besides his eye exam.  Patient stated that he has not been in over a year and forgot who he was seeing.  He had no other concerns to address today

## 2023-02-26 NOTE — Patient Instructions (Addendum)
Nicolas Allen , Thank you for taking time to come for your Medicare Wellness Visit. I appreciate your ongoing commitment to your health goals. Please review the following plan we discussed and let me know if I can assist you in the future.   Referrals/Orders/Follow-Ups/Clinician Recommendations: You are due for a Flu vaccine.  It was nice to speak with you today.  Each day, aim for 6 glasses of water, plenty of protein in your diet and try to get up and walk/ stretch every hour for 5-10 minutes at a time.    This is a list of the screening recommended for you and due dates:  Health Maintenance  Topic Date Due   COVID-19 Vaccine (4 - 2023-24 season) 03/12/2023*   Flu Shot  09/14/2023*   Medicare Annual Wellness Visit  02/26/2024   Colon Cancer Screening  12/09/2025   DTaP/Tdap/Td vaccine (3 - Td or Tdap) 02/22/2029   Pneumonia Vaccine  Completed   Hepatitis C Screening  Completed   Zoster (Shingles) Vaccine  Completed   HPV Vaccine  Aged Out  *Topic was postponed. The date shown is not the original due date.    Advanced directives: (Copy Requested) Please bring a copy of your health care power of attorney and living will to the office to be added to your chart at your convenience.  Next Medicare Annual Wellness Visit scheduled for next year: Yes

## 2023-02-27 ENCOUNTER — Encounter: Payer: Self-pay | Admitting: Internal Medicine

## 2023-02-27 DIAGNOSIS — R3129 Other microscopic hematuria: Secondary | ICD-10-CM | POA: Insufficient documentation

## 2023-02-27 NOTE — Assessment & Plan Note (Signed)
BP Readings from Last 3 Encounters:  02/24/23 124/76  02/12/23 101/63  01/19/23 99/64   Stable, pt to continue medical treatment lotrel 10-20 qd

## 2023-02-27 NOTE — Assessment & Plan Note (Signed)
Lab Results  Component Value Date   HGBA1C 6.3 02/20/2023   Stable, pt to continue current medical treatment  - diet,wt control

## 2023-02-27 NOTE — Assessment & Plan Note (Signed)
Lab Results  Component Value Date   LDLCALC 104 (H) 02/20/2023   Uncontrolled, goal ldl < 70, pt to continue current statin crestor 40 mg every day, declines add zetia 10 for now, also for lower chol diet

## 2023-02-27 NOTE — Assessment & Plan Note (Signed)
Last vitamin D Lab Results  Component Value Date   VD25OH 63.96 02/20/2023   Stable, cont oral replacement

## 2023-02-27 NOTE — Assessment & Plan Note (Signed)
Lab Results  Component Value Date   VITAMINB12 496 02/20/2023   Stable, cont oral replacement - b12 1000 mcg qd

## 2023-02-27 NOTE — Assessment & Plan Note (Signed)
4th finger right hand - cont asa 81 every day, for f/u mri sept 25 - declines mri sooner or neuro referral for now,  to f/u any worsening symptoms or concerns

## 2023-02-27 NOTE — Assessment & Plan Note (Signed)
Small on ua, asympt, declines urology for now

## 2023-03-02 ENCOUNTER — Inpatient Hospital Stay: Payer: Medicare Other

## 2023-03-02 ENCOUNTER — Ambulatory Visit: Payer: Medicare Other | Admitting: Radiation Oncology

## 2023-03-03 ENCOUNTER — Inpatient Hospital Stay: Payer: Medicare Other | Attending: Internal Medicine

## 2023-03-03 ENCOUNTER — Encounter: Payer: Self-pay | Admitting: Internal Medicine

## 2023-03-03 DIAGNOSIS — C7931 Secondary malignant neoplasm of brain: Secondary | ICD-10-CM | POA: Diagnosis not present

## 2023-03-03 DIAGNOSIS — Z452 Encounter for adjustment and management of vascular access device: Secondary | ICD-10-CM | POA: Insufficient documentation

## 2023-03-03 DIAGNOSIS — C3411 Malignant neoplasm of upper lobe, right bronchus or lung: Secondary | ICD-10-CM | POA: Insufficient documentation

## 2023-03-03 DIAGNOSIS — Z95828 Presence of other vascular implants and grafts: Secondary | ICD-10-CM

## 2023-03-03 MED ORDER — HEPARIN SOD (PORK) LOCK FLUSH 100 UNIT/ML IV SOLN
500.0000 [IU] | Freq: Once | INTRAVENOUS | Status: AC
  Administered 2023-03-03: 500 [IU]

## 2023-03-03 MED ORDER — SODIUM CHLORIDE 0.9% FLUSH
10.0000 mL | Freq: Once | INTRAVENOUS | Status: AC
  Administered 2023-03-03: 10 mL

## 2023-03-11 ENCOUNTER — Ambulatory Visit
Admission: RE | Admit: 2023-03-11 | Discharge: 2023-03-11 | Disposition: A | Discharge status: Home / Self Care | Payer: Medicare Other | Source: Ambulatory Visit | Attending: Radiation Oncology | Admitting: Radiation Oncology

## 2023-03-11 DIAGNOSIS — C7931 Secondary malignant neoplasm of brain: Secondary | ICD-10-CM | POA: Diagnosis not present

## 2023-03-11 DIAGNOSIS — C801 Malignant (primary) neoplasm, unspecified: Secondary | ICD-10-CM | POA: Diagnosis not present

## 2023-03-11 MED ORDER — HEPARIN SOD (PORK) LOCK FLUSH 100 UNIT/ML IV SOLN
500.0000 [IU] | Freq: Once | INTRAVENOUS | Status: AC
  Administered 2023-03-11: 500 [IU] via INTRAVENOUS

## 2023-03-11 MED ORDER — GADOPICLENOL 0.5 MMOL/ML IV SOLN
7.0000 mL | Freq: Once | INTRAVENOUS | Status: AC | PRN
  Administered 2023-03-11: 7 mL via INTRAVENOUS

## 2023-03-11 MED ORDER — SODIUM CHLORIDE 0.9% FLUSH
10.0000 mL | INTRAVENOUS | Status: DC | PRN
  Administered 2023-03-11: 10 mL via INTRAVENOUS

## 2023-03-12 NOTE — Progress Notes (Signed)
Radiation Oncology         [phone] ________________________________  Outpatient follow-up- Conducted via telephone at patient request.  I spoke with the patient to conduct this visit via telephone. The patient was notified in advance and was offered an in person or telemedicine meeting to allow for face to face communication but instead preferred to proceed with a telephone visit.    Name: Nicolas Allen        MRN: [id number]  Date of Service: 03/16/2023 DOB: 07/25/1955  WU:[id number], Nicolas Blalock, MD  Corwin Levins, MD     REFERRING PHYSICIAN: Corwin Levins, MD   DIAGNOSIS: The encounter diagnosis was Malignant neoplasm metastatic to brain [facility]).   HISTORY OF PRESENT ILLNESS: Nicolas Allen is a 66 y.o. male with a history of stage IV non-small cell lung cancer, adenocarcinoma involving multifocal disease in the lung and 3 brain metastases. He was diagnosed in the fall of 2021 and found to have metastatic disease in the lung and brain for which he has received stereotactic radiosurgery [facility]) with Dr. Mitzi Allen.  While he did have pseudoprogression due to his immunotherapy, there was growth in the same mid cerebellar lesion and after discussion in brain oncology conference, it was felt he should pursue re-irradiation which was completed with SRS  on 02/12/22. He completed consolidative chemo/immunotherapy on 09/25/22 with Dr. Arbutus Allen.   A CT CAP for surveillance was performed on 01/16/23. This showed an increase in a nodule that was being followed in the RML. It is pleural based, and measures 6.8 mm, previously 6.5 mm in April 2024, previously 4.4 mm in February 2024.  He went on to receive stereotactic body radiotherapy (SBRT) to this site which he completed on 02/17/23.   He has been followed with surveillance MRIs as well since his original treatment with SRS and then subsequent reirradiation in August 2023.  Scans in May 2024 showed an increase in the edema around the lesion of the cerebellar  vermis measuring 1.9 cm, previously 1.4 cm. No new disease is noted.  It was recommended that he continue at close intervals and as he remained asymptomatic to hold off on systemic steroids or Avastin unless he became symptomatic. His most recent MRI on 03/11/2023 showed a stable punctate area in the right superior frontal gyrus going back to 2022 felt to be benign, a punctate enhancing lesion in the right cerebellar hemisphere that was not definitely seen on more remote studies but felt to be artifact from a small blood vessel, and more importantly this area in the cerebellar vermis continues to be followed measuring up to 2 cm in greatest dimension, similar to prior study with surrounding FLAIR signal abnormality having increased.  The fourth ventricle remains patent without hydrocephalus.  The patient is contacted by phone to discuss these results.  PREVIOUS RADIATION THERAPY:  02/10/23-02/17/23 SBRT Treatment The tumor in the RML was treated with a course of stereotactic body radiation treatment. The patient will complete his therapy next week and will receive a total of 54 Gy In 3 fractions at 18 G per fraction.  02/12/2022 through 02/12/2022 SRS Treatment Site Technique Total Dose (Gy) Dose per Fx (Gy) Completed Fx Beam Energies  Brain:  PTV_2_Retreat_65mm Mid Cerebellum IMRT 20/20 20 1/1 6XFFF    04/20/20 SRS Treatment: Each site below was treated to 20 Gy in 1 fraction PTV1 Rt Cerebellum 4mm PTV2Mid cerebellum 11mm PTV3Lt Parietal 6mm    PAST MEDICAL HISTORY:  Past Medical History:  Diagnosis  Date   Anemia 06/24/2012   ANXIETY 02/03/2007   Cervical disc disease 02/12/2012   S/p surgury jan 2013   Erectile dysfunction 02/11/2011   GERD (gastroesophageal reflux disease)    GLUCOSE INTOLERANCE 01/04/2008   HYPERLIPIDEMIA 02/03/2007   HYPERTENSION 02/03/2007   IBS (irritable bowel syndrome)    Impaired glucose tolerance 02/11/2011   nscl ca 03/30/2020   Smoker 08/22/2014    Substance abuse (HCC) 2001   sober for 16 yrs       PAST SURGICAL HISTORY: Past Surgical History:  Procedure Laterality Date   BRONCHIAL BIOPSY  03/30/2020   Procedure: BRONCHIAL BIOPSIES;  Surgeon: Josephine Igo, DO;  Location: [facility];  Service: Pulmonary;;   BRONCHIAL BRUSHINGS  03/30/2020   Procedure: BRONCHIAL BRUSHINGS;  Surgeon: Josephine Igo, DO;  Location: [facility];  Service: Pulmonary;;   BRONCHIAL NEEDLE ASPIRATION BIOPSY  03/30/2020   Procedure: BRONCHIAL NEEDLE ASPIRATION BIOPSIES;  Surgeon: Josephine Igo, DO;  Location: [facility];  Service: Pulmonary;;   BRONCHIAL WASHINGS  03/30/2020   Procedure: BRONCHIAL WASHINGS;  Surgeon: Josephine Igo, DO;  Location: [facility];  Service: Pulmonary;;   COLONOSCOPY  2007   IR IMAGING GUIDED PORT INSERTION  04/23/2020   neck fusion  2012   C4   NO PAST SURGERIES     VIDEO BRONCHOSCOPY WITH ENDOBRONCHIAL NAVIGATION N/A 03/30/2020   Procedure: VIDEO BRONCHOSCOPY WITH ENDOBRONCHIAL NAVIGATION;  Surgeon: Josephine Igo, DO;  Location: [facility];  Service: Pulmonary;  Laterality: N/A;   VIDEO BRONCHOSCOPY WITH ENDOBRONCHIAL ULTRASOUND N/A 03/30/2020   Procedure: VIDEO BRONCHOSCOPY WITH ENDOBRONCHIAL ULTRASOUND;  Surgeon: Josephine Igo, DO;  Location: [facility];  Service: Pulmonary;  Laterality: N/A;     FAMILY HISTORY:  Family History  Problem Relation Age of Onset   Cancer Mother        esophagus   Cancer Cousin    Stroke Other    Hypertension Other    Colon cancer Neg Hx      SOCIAL HISTORY:  reports that he quit smoking about 3 years ago. His smoking use included cigarettes. He started smoking about 33 years ago. He has a 45 pack-year smoking history. He has quit using smokeless tobacco.  His smokeless tobacco use included chew. He reports that he does not drink alcohol and does not use drugs. The patient is married and resides in [place]. 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    ALLERGIES: Patient has no known allergies.   MEDICATIONS:  Current Outpatient Medications  Medication Sig Dispense Refill   amLODipine-benazepril (LOTREL) 10-20 MG capsule Take 1 capsule by mouth once daily 90 capsule 3   aspirin 325 MG EC tablet Take 1 tablet (325 mg total) by mouth daily. 90 tablet 99   b complex vitamins tablet Take 1 tablet by mouth daily.     cholecalciferol (VITAMIN D3) 25 MCG (1000 UNIT) tablet Take 1,000 Units by mouth daily.     folic acid (FOLVITE) 1 MG tablet Take 1 tablet by mouth once daily 90 tablet 3   Garlic 1000 MG CAPS Take 1,000 mg by mouth daily.      lidocaine-prilocaine (EMLA) cream Apply to the Port-A-Cath site 30-60 minutes before chemotherapy. 30 g 0   Omega-3 Fatty Acids (FISH OIL PO) Take 1 capsule by mouth daily.      polyethylene glycol (MIRALAX / GLYCOLAX) 17 g packet Take  17 g by mouth daily as needed.     prochlorperazine (COMPAZINE) 10 MG tablet Take 1 tablet (10 mg total) by mouth every 6 (six) hours as needed for nausea or vomiting. 30 tablet 0   rosuvastatin (CRESTOR) 40 MG tablet Take 1 tablet (40 mg total) by mouth daily. 90 tablet 3   vardenafil (LEVITRA) 20 MG tablet TAKE 1 TABLET BY MOUTH AS NEEDED FOR  ERECTILE  DYSFUNCTION 10 tablet 5   No current facility-administered medications for this encounter.     REVIEW OF SYSTEMS: On review of systems, the patient reports he is not having any concerns with headaches, blurred vision double vision, difficulty with coordination, gait, dizziness or feeling off balance.  He tolerated radiation quite well to the chest and denies any difficulty with breathing, fevers or chills.  He is not having any new cough.  He reports he has not seen ENT this year and was given their number to call them for follow-up.  No other complaints are verbalized.  PHYSICAL EXAM:  Unable to assess given encounter type.    ECOG  = 0  0 - Asymptomatic (Fully active, able to carry on all predisease activities without restriction)  1 - Symptomatic but completely ambulatory (Restricted in physically strenuous activity but ambulatory and able to carry out work of a light or sedentary nature. For example, light housework, office work)  2 - Symptomatic, <50% in bed during the day (Ambulatory and capable of all self care but unable to carry out any work activities. Up and about more than 50% of waking hours)  3 - Symptomatic, >50% in bed, but not bedbound (Capable of only limited self-care, confined to bed or chair 50% or more of waking hours)  4 - Bedbound (Completely disabled. Cannot carry on any self-care. Totally confined to bed or chair)  5 - Death   Nicolas Allen MM, Creech RH, Tormey DC, et al. [phone]). "Toxicity and response criteria of the [facility]". Am. Evlyn Clines. Oncol. 5 (6): 649-55    LABORATORY DATA:  Lab Results  Component Value Date   WBC 3.9 (L) 01/19/2023   HGB 11.0 (L) 01/19/2023   HCT 32.2 (L) 01/19/2023   MCV 79.1 (L) 01/19/2023   PLT 150 01/19/2023   Lab Results  Component Value Date   NA 139 01/19/2023   K 3.9 01/19/2023   CL 108 01/19/2023   CO2 26 01/19/2023   Lab Results  Component Value Date   ALT 8 01/19/2023   AST 18 01/19/2023   ALKPHOS 57 01/19/2023   BILITOT 0.3 01/19/2023      RADIOGRAPHY: MR Brain W Wo Contrast  Result Date: 03/13/2023 CLINICAL DATA:  Brain metastases EXAM: MRI HEAD WITHOUT AND WITH CONTRAST TECHNIQUE: Multiplanar, multiecho pulse sequences of the brain and surrounding structures were obtained without and with intravenous contrast. CONTRAST:  7 cc Vueway COMPARISON:  Brain MRI 11/11/2022 FINDINGS: Brain: The enhancing lesion in the cerebellar vermis today measures 1.9 cm AP x 2.0 cm TV x 1.2 cm cc which is similar to the prior study, but the lesion is slightly increased in bulk particularly inferiorly (previously measured 1.9 cm x 1.9 cm x  1.2 cm). Surrounding FLAIR signal abnormality has slightly increased. The fourth ventricle remains patent, without upstream hydrocephalus. A punctate enhancing lesion in the right cerebellar hemisphere laterally may has been present on the most recent prior study but was not definitely seen on more remote studies (13-43). This may be artifactual related to  a small blood vessel. A punctate enhancing lesion in the right superior frontal gyrus is unchanged going back to at least 2022 (13-130). There are no other lesions. There is no acute intracranial hemorrhage, extra-axial fluid collection, or acute infarct. Parenchymal volume is normal. Background chronic small-vessel ischemic change in the supratentorial white matter is stable. The pituitary and suprasellar region are normal. There is no midline shift. Vascular: Normal flow voids. Skull and upper cervical spine: Normal marrow signal. Sinuses/Orbits: The paranasal sinuses are clear. The globes and orbits are unremarkable. Other: Small bilateral mastoid effusions are noted. The 2.0 cm right parotid lesion is unchanged. IMPRESSION: 1. Slightly increased bulk of the enhancing lesion in the cerebellar vermis with slightly increased surrounding edema but no fourth ventricular effacement or upstream hydrocephalus. 2. Punctate lesion in the right cerebellum was likely present on the prior study but not definitely present on more remote studies. This is indeterminate and may reflect artifact related to a small-vessel. Recommend attention on follow-up. Otherwise, no new lesions. Electronically Signed   By: Lesia Hausen M.D.   On: 03/13/2023 10:41        IMPRESSION/PLAN: 1. Stage IV non-small cell lung cancer, adenocarcinoma involving multifocal disease in the lung and brain metastases.  The patient did very well with his most recent course of radiation to the right middle lobe.  He remains asymptomatic though his MRI scans show evidence of inflammatory changes from his  prior radiation.  Given that he is asymptomatic we will hold off on steroids for now as his case was discussed in brain oncology conference this morning, would have a low threshold to do so.  The patient is in agreement with this plan as is his wife.  He will otherwise continue surveillance with Dr. Arbutus Allen it is scheduled for follow-up CT restaging of the chest abdomen pelvis in early November 2024.  We discussed the rationale to repeat his MRI of the brain also in November.  2. Right parotid pleomorphic adenoma. While his biopsy in 2023 was benign, he will see Dr. Marene Allen on an annual basis due to risks of malignant transformation.  I gave him her office number to call for follow-up appointment.     This encounter was conducted via telephone.  The patient has provided two factor identification and has given verbal consent for this type of encounter and has been advised to only accept a meeting of this type in a secure network environment. The time spent during this encounter was 30 minutes including preparation, discussion, and coordination of the patient's care. The attendants for this meeting include  Nicolas Allen  and Nicolas Allen and Nicolas Allen.  During the encounter,  Nicolas Allen were located at [facility] Radiation Oncology Department.  Nicolas Allen was located at home with his wife Nicolas Allen.       Osker Mason, PAC

## 2023-03-16 ENCOUNTER — Other Ambulatory Visit: Payer: Self-pay | Admitting: Radiation Therapy

## 2023-03-16 ENCOUNTER — Inpatient Hospital Stay: Payer: Medicare Other

## 2023-03-16 ENCOUNTER — Encounter: Payer: Self-pay | Admitting: Radiation Oncology

## 2023-03-16 ENCOUNTER — Ambulatory Visit
Admission: RE | Admit: 2023-03-16 | Discharge: 2023-03-16 | Disposition: A | Discharge status: Home / Self Care | Payer: Medicare Other | Source: Ambulatory Visit | Attending: Radiation Oncology | Admitting: Radiation Oncology

## 2023-03-16 DIAGNOSIS — C7931 Secondary malignant neoplasm of brain: Secondary | ICD-10-CM

## 2023-03-16 NOTE — Progress Notes (Signed)
Order entered for port access and de-access by RN at [facility] the day of scheduled brain MRI.  Jalene Mullet R.T.(R)(T)

## 2023-03-16 NOTE — Addendum Note (Signed)
Encounter addended by: Birdena Crandall, LPN on: 1/61/0960 12:48 PM  Actions taken: Vitals modified, Medication List reviewed, Problem List reviewed, Allergies reviewed, Flowsheet accepted, Clinical Note Signed

## 2023-03-16 NOTE — Progress Notes (Signed)
Telephone nursing appointment for review of most recent scan results. I verified patient's identity x2 and began nursing interview.   Patient reports doing well. No issues conveyed at this time.   Meaningful use complete.   Patient aware of their 1pm telephone appointment w/ Laurence Aly PA-C. I left my extension [phone] in case patient needs anything. Patient verbalized understanding. This concludes the nursing interview.   Patient contact [phone]     Ruel Favors, LPN

## 2023-04-02 ENCOUNTER — Other Ambulatory Visit: Payer: Self-pay

## 2023-04-02 ENCOUNTER — Other Ambulatory Visit: Payer: Self-pay | Admitting: Internal Medicine

## 2023-04-14 ENCOUNTER — Ambulatory Visit (HOSPITAL_COMMUNITY)
Admission: RE | Admit: 2023-04-14 | Discharge: 2023-04-14 | Disposition: A | Discharge status: Home / Self Care | Payer: Medicare Other | Source: Ambulatory Visit | Attending: Internal Medicine | Admitting: Internal Medicine

## 2023-04-14 ENCOUNTER — Inpatient Hospital Stay: Payer: Medicare Other | Attending: Internal Medicine

## 2023-04-14 ENCOUNTER — Other Ambulatory Visit: Payer: Self-pay | Admitting: Internal Medicine

## 2023-04-14 DIAGNOSIS — I3139 Other pericardial effusion (noninflammatory): Secondary | ICD-10-CM | POA: Diagnosis not present

## 2023-04-14 DIAGNOSIS — D649 Anemia, unspecified: Secondary | ICD-10-CM | POA: Insufficient documentation

## 2023-04-14 DIAGNOSIS — C7931 Secondary malignant neoplasm of brain: Secondary | ICD-10-CM | POA: Insufficient documentation

## 2023-04-14 DIAGNOSIS — C3490 Malignant neoplasm of unspecified part of unspecified bronchus or lung: Secondary | ICD-10-CM | POA: Insufficient documentation

## 2023-04-14 DIAGNOSIS — J432 Centrilobular emphysema: Secondary | ICD-10-CM | POA: Diagnosis not present

## 2023-04-14 DIAGNOSIS — J90 Pleural effusion, not elsewhere classified: Secondary | ICD-10-CM | POA: Diagnosis not present

## 2023-04-14 DIAGNOSIS — N289 Disorder of kidney and ureter, unspecified: Secondary | ICD-10-CM | POA: Insufficient documentation

## 2023-04-14 DIAGNOSIS — C3411 Malignant neoplasm of upper lobe, right bronchus or lung: Secondary | ICD-10-CM | POA: Diagnosis not present

## 2023-04-14 DIAGNOSIS — Z95828 Presence of other vascular implants and grafts: Secondary | ICD-10-CM

## 2023-04-14 DIAGNOSIS — I700 Atherosclerosis of aorta: Secondary | ICD-10-CM | POA: Diagnosis not present

## 2023-04-14 LAB — CMP (CANCER CENTER ONLY)
ALT: 11 U/L (ref 0–44)
AST: 29 U/L (ref 15–41)
Albumin: 3.4 g/dL — ABNORMAL LOW (ref 3.5–5.0)
Alkaline Phosphatase: 51 U/L (ref 38–126)
Anion gap: 6 (ref 5–15)
BUN: 18 mg/dL (ref 8–23)
CO2: 26 mmol/L (ref 22–32)
Calcium: 8.8 mg/dL — ABNORMAL LOW (ref 8.9–10.3)
Chloride: 108 mmol/L (ref 98–111)
Creatinine: 1.49 mg/dL — ABNORMAL HIGH (ref 0.61–1.24)
GFR, Estimated: 51 mL/min — ABNORMAL LOW (ref >60)
Glucose, Bld: 83 mg/dL (ref 70–99)
Potassium: 4 mmol/L (ref 3.5–5.1)
Sodium: 140 mmol/L (ref 135–145)
Total Bilirubin: 0.4 mg/dL (ref 0.3–1.2)
Total Protein: 6.5 g/dL (ref 6.5–8.1)

## 2023-04-14 LAB — CBC WITH DIFFERENTIAL (CANCER CENTER ONLY)
Abs Immature Granulocytes: 0.01 10*3/uL (ref 0.00–0.07)
Basophils Absolute: 0 10*3/uL (ref 0.0–0.1)
Basophils Relative: 1 %
Eosinophils Absolute: 0.2 10*3/uL (ref 0.0–0.5)
Eosinophils Relative: 5 %
HCT: 32.9 % — ABNORMAL LOW (ref 39.0–52.0)
Hemoglobin: 10.9 g/dL — ABNORMAL LOW (ref 13.0–17.0)
Immature Granulocytes: 0 %
Lymphocytes Relative: 42 %
Lymphs Abs: 1.6 10*3/uL (ref 0.7–4.0)
MCH: 25.8 pg — ABNORMAL LOW (ref 26.0–34.0)
MCHC: 33.1 g/dL (ref 30.0–36.0)
MCV: 77.8 fL — ABNORMAL LOW (ref 80.0–100.0)
Monocytes Absolute: 0.5 10*3/uL (ref 0.1–1.0)
Monocytes Relative: 14 %
Neutro Abs: 1.4 10*3/uL — ABNORMAL LOW (ref 1.7–7.7)
Neutrophils Relative %: 38 %
Platelet Count: 128 10*3/uL — ABNORMAL LOW (ref 150–400)
RBC: 4.23 MIL/uL (ref 4.22–5.81)
RDW: 17.5 % — ABNORMAL HIGH (ref 11.5–15.5)
WBC Count: 3.6 10*3/uL — ABNORMAL LOW (ref 4.0–10.5)
nRBC: 0 % (ref 0.0–0.2)

## 2023-04-14 MED ORDER — HEPARIN SOD (PORK) LOCK FLUSH 100 UNIT/ML IV SOLN
500.0000 [IU] | Freq: Once | INTRAVENOUS | Status: AC
  Administered 2023-04-14: 500 [IU] via INTRAVENOUS

## 2023-04-14 MED ORDER — HEPARIN SOD (PORK) LOCK FLUSH 100 UNIT/ML IV SOLN
INTRAVENOUS | Status: AC
  Filled 2023-04-14: qty 5

## 2023-04-21 ENCOUNTER — Inpatient Hospital Stay: Payer: Medicare Other | Attending: Internal Medicine | Admitting: Internal Medicine

## 2023-04-21 VITALS — BP 99/60 | HR 74 | Temp 98.2°F | Resp 17 | Ht 72.0 in | Wt 160.7 lb

## 2023-04-21 DIAGNOSIS — Z87891 Personal history of nicotine dependence: Secondary | ICD-10-CM | POA: Diagnosis not present

## 2023-04-21 DIAGNOSIS — C3490 Malignant neoplasm of unspecified part of unspecified bronchus or lung: Secondary | ICD-10-CM | POA: Diagnosis not present

## 2023-04-21 DIAGNOSIS — C3411 Malignant neoplasm of upper lobe, right bronchus or lung: Secondary | ICD-10-CM | POA: Diagnosis not present

## 2023-04-21 DIAGNOSIS — C7931 Secondary malignant neoplasm of brain: Secondary | ICD-10-CM | POA: Insufficient documentation

## 2023-04-21 DIAGNOSIS — D649 Anemia, unspecified: Secondary | ICD-10-CM | POA: Diagnosis not present

## 2023-04-21 DIAGNOSIS — Z79899 Other long term (current) drug therapy: Secondary | ICD-10-CM | POA: Insufficient documentation

## 2023-04-21 DIAGNOSIS — N289 Disorder of kidney and ureter, unspecified: Secondary | ICD-10-CM | POA: Diagnosis not present

## 2023-04-21 NOTE — Progress Notes (Signed)
[facility] [phone]   [phone]  OFFICE PROGRESS NOTE  Corwin Levins, MD [address] [place] [address]  DIAGNOSIS: Stage IV (T3, N2, M1c) non-small cell lung cancer favoring adenocarcinoma presented with right upper lobe lung mass in addition to right lower lobe, left lower lobe in addition to right hilar and mediastinal lymphadenopathy in addition to metastatic brain lesions diagnosed in October 2021.  Molecular studies by [employer]:  [id number], 9.5%,   PRIOR THERAPY:  1) SRS to 3 brain lesion under the care of Dr. Mitzi Hansen. 2) Systemic chemotherapy with carboplatin for AUC of 5, Alimta 500 mg/M2 and Keytruda 200 mg IV every 3 weeks.  First dose April 24, 2020.  Status post 43 cycles.  Starting from cycle #5 the patient will be on maintenance treatment with Alimta and Keytruda every 3 weeks.  Last dose  was given on 09/23/2022 discontinued secondary to renal insufficiency and worsening edema  CURRENT THERAPY: Observation.  INTERVAL HISTORY: Nicolas Allen 67 y.o. male returns to the clinic today for follow-up visit.  Discussed the use of AI scribe software for clinical note transcription with the patient, who gave verbal consent to proceed.  History of Present Illness   The patient, a 67 year old with a history of adenocarcinoma, diagnosed in October 2021 for cancer treatment. They underwent chemotherapy with carboplatin, Alimta, and Keytruda, and continued on Keytruda for 43 cycles. The last dose of treatment was administered in April 2024, and since then, the patient has been under observation.  In the months following the cessation of treatment, the patient reports no significant health issues. They deny experiencing chest pain, shortness of breath, cough, nausea, vomiting, or diarrhea. They note a slight weight loss, but their weight has remained stable at 160 pounds since their last visit.  The results showed bilateral  nodules in the lung, which were either stable or decreased in size, with no new spots or spread to the abdomen or pelvis.  They express hope that they may not require further treatment if their condition remains stable.       MEDICAL HISTORY: Past Medical History:  Diagnosis Date   Anemia 06/24/2012   ANXIETY 02/03/2007   Cervical disc disease 02/12/2012   S/p surgury jan 2013   Erectile dysfunction 02/11/2011   GERD (gastroesophageal reflux disease)    GLUCOSE INTOLERANCE 01/04/2008   HYPERLIPIDEMIA 02/03/2007   HYPERTENSION 02/03/2007   IBS (irritable bowel syndrome)    Impaired glucose tolerance 02/11/2011   nscl ca 03/30/2020   Smoker 08/22/2014   Substance abuse (HCC) 2001   sober for 16 yrs    ALLERGIES:  has No Known Allergies.  MEDICATIONS:  Current Outpatient Medications  Medication Sig Dispense Refill   folic acid (FOLVITE) 1 MG tablet Take 1 tablet by mouth once daily 90 tablet 3   amLODipine-benazepril (LOTREL) 10-20 MG capsule Take 1 capsule by mouth once daily 90 capsule 3   aspirin 325 MG EC tablet Take 1 tablet (325 mg total) by mouth daily. 90 tablet 99   b complex vitamins tablet Take 1 tablet by mouth daily.     cholecalciferol (VITAMIN D3) 25 MCG (1000 UNIT) tablet Take 1,000 Units by mouth daily.     Garlic 1000 MG CAPS Take 1,000 mg by mouth daily.      lidocaine-prilocaine (EMLA) cream Apply to the Port-A-Cath site 30-60 minutes before chemotherapy. 30 g 0   Omega-3 Fatty Acids (FISH OIL  PO) Take 1 capsule by mouth daily.      polyethylene glycol (MIRALAX / GLYCOLAX) 17 g packet Take 17 g by mouth daily as needed.     prochlorperazine (COMPAZINE) 10 MG tablet Take 1 tablet (10 mg total) by mouth every 6 (six) hours as needed for nausea or vomiting. 30 tablet 0   rosuvastatin (CRESTOR) 40 MG tablet Take 1 tablet (40 mg total) by mouth daily. 90 tablet 3   vardenafil (LEVITRA) 20 MG tablet TAKE 1 TABLET BY MOUTH AS NEEDED FOR  ERECTILE  DYSFUNCTION 10  tablet 5   No current facility-administered medications for this visit.    SURGICAL HISTORY:  Past Surgical History:  Procedure Laterality Date   BRONCHIAL BIOPSY  03/30/2020   Procedure: BRONCHIAL BIOPSIES;  Surgeon: Josephine Igo, DO;  Location: [facility];  Service: Pulmonary;;   BRONCHIAL BRUSHINGS  03/30/2020   Procedure: BRONCHIAL BRUSHINGS;  Surgeon: Josephine Igo, DO;  Location: [facility];  Service: Pulmonary;;   BRONCHIAL NEEDLE ASPIRATION BIOPSY  03/30/2020   Procedure: BRONCHIAL NEEDLE ASPIRATION BIOPSIES;  Surgeon: Josephine Igo, DO;  Location: [facility];  Service: Pulmonary;;   BRONCHIAL WASHINGS  03/30/2020   Procedure: BRONCHIAL WASHINGS;  Surgeon: Josephine Igo, DO;  Location: [facility];  Service: Pulmonary;;   COLONOSCOPY  2007   IR IMAGING GUIDED PORT INSERTION  04/23/2020   neck fusion  2012   C4   NO PAST SURGERIES     VIDEO BRONCHOSCOPY WITH ENDOBRONCHIAL NAVIGATION N/A 03/30/2020   Procedure: VIDEO BRONCHOSCOPY WITH ENDOBRONCHIAL NAVIGATION;  Surgeon: Josephine Igo, DO;  Location: [facility];  Service: Pulmonary;  Laterality: N/A;   VIDEO BRONCHOSCOPY WITH ENDOBRONCHIAL ULTRASOUND N/A 03/30/2020   Procedure: VIDEO BRONCHOSCOPY WITH ENDOBRONCHIAL ULTRASOUND;  Surgeon: Josephine Igo, DO;  Location: [facility];  Service: Pulmonary;  Laterality: N/A;    REVIEW OF SYSTEMS:  Constitutional: positive for fatigue Eyes: negative Ears, nose, mouth, throat, and face: negative Respiratory: negative Cardiovascular: negative Gastrointestinal: negative Genitourinary:negative Integument/breast: negative Hematologic/lymphatic: negative Musculoskeletal:negative Neurological: negative Behavioral/Psych: negative Endocrine: negative Allergic/Immunologic: negative   PHYSICAL EXAMINATION: General appearance: alert, cooperative, fatigued, and no distress Head: Normocephalic, without obvious abnormality, atraumatic Neck: no adenopathy, no JVD,  supple, symmetrical, trachea midline, and thyroid not enlarged, symmetric, no tenderness/mass/nodules Lymph nodes: Cervical, supraclavicular, and axillary nodes normal. Resp: clear to auscultation bilaterally Back: symmetric, no curvature. ROM normal. No CVA tenderness. Cardio: regular rate and rhythm, S1, S2 normal, no murmur, click, rub or gallop GI: soft, non-tender; bowel sounds normal; no masses,  no organomegaly Extremities: extremities normal, atraumatic, no cyanosis or edema Neurologic: Alert and oriented X 3, normal strength and tone. Normal symmetric reflexes. Normal coordination and gait  ECOG PERFORMANCE STATUS: 1 - Symptomatic but completely ambulatory  Blood pressure 99/60, pulse 74, temperature 98.2 F (36.8 C), temperature source Oral, resp. rate 17, height 6' (1.829 m), weight 160 lb 11.2 oz (72.9 kg), SpO2 100%.  LABORATORY DATA: Lab Results  Component Value Date   WBC 3.6 (L) 04/14/2023   HGB 10.9 (L) 04/14/2023   HCT 32.9 (L) 04/14/2023   MCV 77.8 (L) 04/14/2023   PLT 128 (L) 04/14/2023      Chemistry      Component Value Date/Time   NA 140 04/14/2023 1054   K 4.0 04/14/2023 1054   CL 108 04/14/2023 1054   CO2 26 04/14/2023 1054   BUN 18 04/14/2023 1054   CREATININE 1.49 (H) 04/14/2023 1054   CREATININE 0.88 02/21/2020  1115      Component Value Date/Time   CALCIUM 8.8 (L) 04/14/2023 1054   ALKPHOS 51 04/14/2023 1054   AST 29 04/14/2023 1054   ALT 11 04/14/2023 1054   BILITOT 0.4 04/14/2023 1054       RADIOGRAPHIC STUDIES: CT CHEST ABDOMEN PELVIS WO CONTRAST  Result Date: 04/20/2023 CLINICAL DATA:  Non-small cell lung cancer (NSCLC), staging. * Tracking Code: BO * EXAM: CT CHEST, ABDOMEN AND PELVIS WITHOUT CONTRAST TECHNIQUE: Multidetector CT imaging of the chest, abdomen and pelvis was performed following the standard protocol without IV contrast. RADIATION DOSE REDUCTION: This exam was performed according to the departmental dose-optimization  program which includes automated exposure control, adjustment of the mA and/or kV according to patient size and/or use of iterative reconstruction technique. COMPARISON:  01/16/2023 CT chest, abdomen and pelvis. FINDINGS: CT CHEST FINDINGS Cardiovascular: Normal heart size. Small pericardial effusion anteriorly, slightly decreased. Right subclavian Port-A-Cath terminates at the cavoatrial junction. Atherosclerotic nonaneurysmal thoracic aorta. Normal caliber pulmonary arteries. Mediastinum/Nodes: No significant thyroid nodules. Unremarkable esophagus. No pathologically enlarged axillary, mediastinal or hilar lymph nodes, noting limited sensitivity for the detection of hilar adenopathy on this noncontrast study. Lungs/Pleura: No pneumothorax. Small posterior right pleural effusion, decreased. Resolved left pleural effusion. Moderate centrilobular and mild paraseptal emphysema. No acute consolidative airspace disease. Previously described solid 0.8 cm anterior right middle lobe pulmonary nodule is slightly decreased to 0.7 cm (series 4/image 95). Numerous additional part solid pulmonary nodules and lung masses scattered in both lungs are otherwise not substantially changed. Representative 3.1 cm peripheral right lower lobe sub solid mass (series 4/image 95), previously 3.1 cm. Representative 5.2 cm sub solid peripheral basilar left lower lobe mass (series 4/image 123), previously 5.1 cm. Representative 1.2 cm posterior apical right upper lobe nodule (series 4/image 32), previously 1.2 cm. No new significant pulmonary nodules. Musculoskeletal: No aggressive appearing focal osseous lesions. Mild thoracic spondylosis. Partially visualized surgical hardware from ACDF. Subcutaneous cystic lesions in ventral chest wall measuring up to 2.6 cm on the left (series 2/image 54), not substantially changed and compatible with sebaceous cysts. CT ABDOMEN PELVIS FINDINGS Hepatobiliary: Normal liver with no liver mass. Normal  gallbladder with no radiopaque cholelithiasis. No biliary ductal dilatation. Pancreas: Normal, with no mass or duct dilation. Spleen: Normal size. No mass. Adrenals/Urinary Tract: Normal adrenals. No renal stones. No hydronephrosis. No contour deforming renal masses. Moderate diffuse bladder wall thickening is chronic and not appreciably changed. Stomach/Bowel: Normal non-distended stomach. Normal caliber small bowel with no small bowel wall thickening. Normal appendix. Normal large bowel with no diverticulosis, large bowel wall thickening or pericolonic fat stranding. Vascular/Lymphatic: Atherosclerotic nonaneurysmal abdominal aorta. No pathologically enlarged lymph nodes in the abdomen or pelvis. Reproductive: Top-normal size prostate. Other: No pneumoperitoneum, ascites or focal fluid collection. Musculoskeletal: No aggressive appearing focal osseous lesions. Stable tiny sclerotic superior left sacral ala lesion, nonspecific, probably a benign bone island. Mild lumbar spondylosis. IMPRESSION: 1. Numerous bilateral subsolid pulmonary nodules and lung masses are stable to slightly decreased as detailed. No new or progressive metastatic disease in the chest. 2. No evidence of metastatic disease in the abdomen or pelvis. 3. Small posterior right pleural effusion, decreased. Resolved left pleural effusion. 4. Small pericardial effusion anteriorly, slightly decreased. 5. Aortic Atherosclerosis (ICD10-I70.0) and Emphysema (ICD10-J43.9). Electronically Signed   By: Delbert Phenix M.D.   On: 04/20/2023 17:15     ASSESSMENT AND PLAN: This is a very pleasant 67 years old African-American male recently diagnosed with stage IV (T3, N2, M1c)  non-small cell lung cancer, adenocarcinoma presented with right upper lobe lung mass in addition to right lower lobe, left lower lobe in addition to right hilar and mediastinal lymphadenopathy in addition to metastatic brain lesions diagnosed in October 2021. The patient has molecular  studies by [employer] that showed no actionable mutations. He underwent SRS treatment to his brain lesion. The patient underwent systemic chemotherapy with carboplatin for AUC of 5, Alimta 500 mg/M2 and Keytruda 200 mg IV every 3 weeks status post 43 cycles.  Starting from cycle #5 the patient is on maintenance treatment with Alimta and Keytruda every 3 weeks. He discontinued maintenance Alimta and Keytruda at this point because of the renal insufficiency, worsening anemia and edema. He is currently on observation and he is feeling fine with no concerning complaints. He underwent SBRT to the enlarging right middle lobe lung nodule under the care of Dr. Mitzi Hansen.  He will have a Port-A-Cath flush every 6 weeks. For the worsening renal insufficiency, he was referred to nephrology for evaluation and management of his condition.    Adenocarcinoma of the Lung Stable bilateral lung nodules with no new spots or spread to the abdomen or pelvis on recent imaging. Last treatment was in April 2024 and has been on observation since. No new symptoms reported. -Continue observation. -Order a scan of the chest, abdomen, and pelvis in three months (February 2025). -If progression is noted on future scans, will discuss resumption of treatment.  Brain MRI scheduled for 05/13/2023 with Dr. Mitzi Hansen. -Continue with scheduled MRI.  General Health Maintenance -Return for follow-up in three months (February 2025).   The patient was advised to call immediately if he has any other concerning symptoms in the interval.  The patient voices understanding of current disease status and treatment options and is in agreement with the current care plan.  All questions were answered. The patient knows to call the clinic with any problems, questions or concerns. We can certainly see the patient much sooner if necessary. The total time spent in the appointment was 30 minutes.  Disclaimer: This note was dictated with voice  recognition software. Similar sounding words can inadvertently be transcribed and may not be corrected upon review.

## 2023-05-04 ENCOUNTER — Ambulatory Visit
Admission: RE | Admit: 2023-05-04 | Discharge: 2023-05-04 | Disposition: A | Discharge status: Home / Self Care | Payer: Medicare Other | Source: Ambulatory Visit | Attending: Internal Medicine | Admitting: Internal Medicine

## 2023-05-04 NOTE — Progress Notes (Signed)
  Radiation Oncology         [phone] ________________________________  Name: Nicolas Allen MRN: [id number]  Date of Service: 05/04/2023  DOB: November 14, 1955  Post Treatment Telephone Note  Diagnosis:  Stage IV non-small cell lung cancer, adenocarcinoma involving multifocal disease in the lung and brain metastases with new solitary nodule in the RML. (as documented in provider EOT note)  The patient was not available for call today.   The patient has scheduled follow up with his medical oncologist Dr. Arbutus Ped for ongoing care, and was encouraged to call if he develops concerns or questions regarding radiation.    Ruel Favors, LPN

## 2023-05-06 ENCOUNTER — Encounter: Payer: Self-pay | Admitting: Internal Medicine

## 2023-05-06 NOTE — Telephone Encounter (Signed)
Telephone call  

## 2023-05-13 ENCOUNTER — Ambulatory Visit
Admission: RE | Admit: 2023-05-13 | Discharge: 2023-05-13 | Disposition: A | Discharge status: Home / Self Care | Payer: Medicare Other | Source: Ambulatory Visit | Attending: Radiation Oncology | Admitting: Radiation Oncology

## 2023-05-13 DIAGNOSIS — C7931 Secondary malignant neoplasm of brain: Secondary | ICD-10-CM | POA: Diagnosis not present

## 2023-05-13 DIAGNOSIS — Z85118 Personal history of other malignant neoplasm of bronchus and lung: Secondary | ICD-10-CM | POA: Diagnosis not present

## 2023-05-13 MED ORDER — GADOPICLENOL 0.5 MMOL/ML IV SOLN
7.0000 mL | Freq: Once | INTRAVENOUS | Status: AC | PRN
  Administered 2023-05-13: 7 mL via INTRAVENOUS

## 2023-05-13 MED ORDER — HEPARIN SOD (PORK) LOCK FLUSH 100 UNIT/ML IV SOLN
500.0000 [IU] | Freq: Once | INTRAVENOUS | Status: AC
  Administered 2023-05-13: 500 [IU] via INTRAVENOUS

## 2023-05-13 MED ORDER — SODIUM CHLORIDE 0.9% FLUSH
10.0000 mL | INTRAVENOUS | Status: DC | PRN
  Administered 2023-05-13: 10 mL via INTRAVENOUS

## 2023-05-18 ENCOUNTER — Inpatient Hospital Stay: Payer: Medicare Other | Attending: Internal Medicine

## 2023-05-18 ENCOUNTER — Ambulatory Visit
Admission: RE | Admit: 2023-05-18 | Discharge: 2023-05-18 | Disposition: A | Discharge status: Home / Self Care | Payer: Medicare Other | Source: Ambulatory Visit | Attending: Radiation Oncology | Admitting: Radiation Oncology

## 2023-05-18 DIAGNOSIS — C3411 Malignant neoplasm of upper lobe, right bronchus or lung: Secondary | ICD-10-CM | POA: Insufficient documentation

## 2023-05-18 DIAGNOSIS — Z87891 Personal history of nicotine dependence: Secondary | ICD-10-CM | POA: Insufficient documentation

## 2023-05-18 DIAGNOSIS — Z452 Encounter for adjustment and management of vascular access device: Secondary | ICD-10-CM | POA: Insufficient documentation

## 2023-05-18 DIAGNOSIS — C7931 Secondary malignant neoplasm of brain: Secondary | ICD-10-CM | POA: Insufficient documentation

## 2023-05-18 NOTE — Progress Notes (Signed)
Telephone nursing appointment for review . I verified patient's identity x2 and began nursing interview.   Patient reports doing well. Patient denies any other related issues at this time.   Meaningful use complete.   Patient aware of their 2:30pm- 05/18/2023 telephone appointment w/ Laurence Aly PA-C. I left my extension [phone] in case patient needs anything. Patient verbalized understanding. This concludes the nursing interview.   Patient contact [phone]     Ruel Favors, LPN

## 2023-05-22 ENCOUNTER — Other Ambulatory Visit: Payer: Self-pay | Admitting: Radiation Therapy

## 2023-05-25 ENCOUNTER — Other Ambulatory Visit: Payer: Self-pay

## 2023-05-25 ENCOUNTER — Telehealth: Payer: Self-pay | Admitting: Radiation Oncology

## 2023-05-25 ENCOUNTER — Inpatient Hospital Stay: Payer: Medicare Other

## 2023-05-25 DIAGNOSIS — Z95828 Presence of other vascular implants and grafts: Secondary | ICD-10-CM

## 2023-05-25 DIAGNOSIS — Z452 Encounter for adjustment and management of vascular access device: Secondary | ICD-10-CM | POA: Diagnosis not present

## 2023-05-25 DIAGNOSIS — C3411 Malignant neoplasm of upper lobe, right bronchus or lung: Secondary | ICD-10-CM | POA: Diagnosis not present

## 2023-05-25 DIAGNOSIS — Z87891 Personal history of nicotine dependence: Secondary | ICD-10-CM | POA: Diagnosis not present

## 2023-05-25 DIAGNOSIS — C7931 Secondary malignant neoplasm of brain: Secondary | ICD-10-CM | POA: Diagnosis not present

## 2023-05-25 MED ORDER — HEPARIN SOD (PORK) LOCK FLUSH 100 UNIT/ML IV SOLN
500.0000 [IU] | Freq: Once | INTRAVENOUS | Status: AC
Start: 2023-05-25 — End: 2023-05-25
  Administered 2023-05-25: 500 [IU]

## 2023-05-25 MED ORDER — SODIUM CHLORIDE 0.9% FLUSH
10.0000 mL | Freq: Once | INTRAVENOUS | Status: AC
  Administered 2023-05-25: 10 mL

## 2023-05-25 NOTE — Telephone Encounter (Signed)
I called and left a message with the patient after his case was reviewed in brain oncology conference. The area in question in the right cerebellar hemisphere has previously been within a treated field and no new disease was noted. Recommendations were for repeat in 3 months time.     Osker Mason, PAC

## 2023-05-27 ENCOUNTER — Other Ambulatory Visit: Payer: Self-pay | Admitting: Radiation Therapy

## 2023-05-27 DIAGNOSIS — C7931 Secondary malignant neoplasm of brain: Secondary | ICD-10-CM

## 2023-06-02 ENCOUNTER — Telehealth: Payer: Self-pay | Admitting: Medical Oncology

## 2023-06-02 NOTE — Telephone Encounter (Signed)
Right thigh - He has itchy /burning skin patches on his R thigh that are beginning " to fester and look wet". Cereve calms down the itching but the patches are getting bigger.  I instructed pt to contact Dr. Jonny Ruiz . Nicolas Allen is currently under observation by Dr. Arbutus Ped.

## 2023-06-03 ENCOUNTER — Ambulatory Visit (INDEPENDENT_AMBULATORY_CARE_PROVIDER_SITE_OTHER): Payer: Medicare Other | Admitting: Internal Medicine

## 2023-06-03 ENCOUNTER — Encounter: Payer: Self-pay | Admitting: Internal Medicine

## 2023-06-03 VITALS — BP 118/62 | HR 92 | Temp 98.8°F | Ht 72.0 in | Wt 163.0 lb

## 2023-06-03 DIAGNOSIS — E7800 Pure hypercholesterolemia, unspecified: Secondary | ICD-10-CM | POA: Diagnosis not present

## 2023-06-03 DIAGNOSIS — E538 Deficiency of other specified B group vitamins: Secondary | ICD-10-CM | POA: Diagnosis not present

## 2023-06-03 DIAGNOSIS — E559 Vitamin D deficiency, unspecified: Secondary | ICD-10-CM

## 2023-06-03 DIAGNOSIS — R21 Rash and other nonspecific skin eruption: Secondary | ICD-10-CM

## 2023-06-03 DIAGNOSIS — R7302 Impaired glucose tolerance (oral): Secondary | ICD-10-CM

## 2023-06-03 DIAGNOSIS — I10 Essential (primary) hypertension: Secondary | ICD-10-CM | POA: Diagnosis not present

## 2023-06-03 MED ORDER — PREDNISONE 10 MG PO TABS: ORAL_TABLET | ORAL | 0 refills | Status: AC

## 2023-06-03 MED ORDER — TRIAMCINOLONE ACETONIDE 0.1 % EX CREA: 1.0000 | TOPICAL_CREAM | Freq: Two times a day (BID) | CUTANEOUS | 1 refills | Status: AC

## 2023-06-03 NOTE — Patient Instructions (Signed)
Please take all new medication as prescribed -the prednisone, and cream as needed  Please continue all other medications as before, and refills have been done if requested.  Please have the pharmacy call with any other refills you may need.  Please continue your efforts at being more active, low cholesterol diet, and weight control.  Please keep your appointments with your specialists as you may have planned

## 2023-06-03 NOTE — Progress Notes (Signed)
Patient ID: [name], male   DOB: [birth year], 67 y.o.   MRN: [id number]        Chief Complaint: follow up HTN, HLD and hyperglycemia , rash, low vit d and b12       HPI:  Nicolas Allen is a 67 y.o. male here with c/o 2 wks onset itchy rash to mid anterior and lateral right upper leg , can't stop scratching and only makes it worse.  OTC meds including topical benadryl help itching for a few hours.  No contact allergens to leg he recalls.  No prior hx.  Pt denies chest pain, increased sob or doe, wheezing, orthopnea, PND, increased LE swelling, palpitations, dizziness or syncope.   Pt denies polydipsia, polyuria, or new focal neuro s/s.    Pt denies fever, wt loss, night sweats, loss of appetite, or other constitutional symptoms         Wt Readings from Last 3 Encounters:  06/03/23 163 lb (73.9 kg)  04/21/23 160 lb 11.2 oz (72.9 kg)  02/26/23 160 lb (72.6 kg)   BP Readings from Last 3 Encounters:  06/03/23 118/62  04/21/23 99/60  02/24/23 124/76         Past Medical History:  Diagnosis Date   Anemia 06/24/2012   ANXIETY 02/03/2007   Cervical disc disease 02/12/2012   S/p surgury jan 2013   Erectile dysfunction 02/11/2011   GERD (gastroesophageal reflux disease)    GLUCOSE INTOLERANCE 01/04/2008   HYPERLIPIDEMIA 02/03/2007   HYPERTENSION 02/03/2007   IBS (irritable bowel syndrome)    Impaired glucose tolerance 02/11/2011   nscl ca 03/30/2020   Smoker 08/22/2014   Substance abuse (HCC) 2001   sober for 16 yrs   Past Surgical History:  Procedure Laterality Date   BRONCHIAL BIOPSY  03/30/2020   Procedure: BRONCHIAL BIOPSIES;  Surgeon: Josephine Igo, DO;  Location: [facility];  Service: Pulmonary;;   BRONCHIAL BRUSHINGS  03/30/2020   Procedure: BRONCHIAL BRUSHINGS;  Surgeon: Josephine Igo, DO;  [facility]: [facility];  Service: Pulmonary;;   BRONCHIAL NEEDLE ASPIRATION BIOPSY  03/30/2020   Procedure: BRONCHIAL NEEDLE ASPIRATION BIOPSIES;  Surgeon: Josephine Igo,  DO;  [facility]: [facility];  Service: Pulmonary;;   BRONCHIAL WASHINGS  03/30/2020   Procedure: BRONCHIAL WASHINGS;  Surgeon: Josephine Igo, DO;  [facility]: [facility];  Service: Pulmonary;;   COLONOSCOPY  2007   IR IMAGING GUIDED PORT INSERTION  04/23/2020   neck fusion  2012   C4   NO PAST SURGERIES     VIDEO BRONCHOSCOPY WITH ENDOBRONCHIAL NAVIGATION N/A 03/30/2020   Procedure: VIDEO BRONCHOSCOPY WITH ENDOBRONCHIAL NAVIGATION;  Surgeon: Josephine Igo, DO;  [facility]: [facility];  Service: Pulmonary;  Laterality: N/A;   VIDEO BRONCHOSCOPY WITH ENDOBRONCHIAL ULTRASOUND N/A 03/30/2020   Procedure: VIDEO BRONCHOSCOPY WITH ENDOBRONCHIAL ULTRASOUND;  Surgeon: Josephine Igo, DO;  Location: [facility];  Service: Pulmonary;  Laterality: N/A;    reports that he quit smoking about 3 years ago. His smoking use included cigarettes. He started smoking about 33 years ago. He has a 45 pack-year smoking history. He has quit using smokeless tobacco.  His smokeless tobacco use included chew. He reports that he does not drink alcohol and does not use drugs. family history includes Cancer in his cousin and mother; Hypertension in an other family member; Stroke in an other family member. No Known Allergies Current Outpatient Medications on File Prior to Visit  Medication Sig Dispense Refill   amLODipine-benazepril (LOTREL) 10-20 MG  capsule Take 1 capsule by mouth once daily 90 capsule 3   aspirin 325 MG EC tablet Take 1 tablet (325 mg total) by mouth daily. 90 tablet 99   b complex vitamins tablet Take 1 tablet by mouth daily.     cholecalciferol (VITAMIN D3) 25 MCG (1000 UNIT) tablet Take 1,000 Units by mouth daily.     folic acid (FOLVITE) 1 MG tablet Take 1 tablet by mouth once daily 90 tablet 3   Garlic 1000 MG CAPS Take 1,000 mg by mouth daily.      lidocaine-prilocaine (EMLA) cream Apply to the Port-A-Cath site 30-60 minutes before chemotherapy. 30 g 0   Omega-3 Fatty Acids (FISH OIL PO)  Take 1 capsule by mouth daily.      polyethylene glycol (MIRALAX / GLYCOLAX) 17 g packet Take 17 g by mouth daily as needed.     prochlorperazine (COMPAZINE) 10 MG tablet Take 1 tablet (10 mg total) by mouth every 6 (six) hours as needed for nausea or vomiting. 30 tablet 0   rosuvastatin (CRESTOR) 40 MG tablet Take 1 tablet (40 mg total) by mouth daily. 90 tablet 3   vardenafil (LEVITRA) 20 MG tablet TAKE 1 TABLET BY MOUTH AS NEEDED FOR  ERECTILE  DYSFUNCTION 10 tablet 5   No current facility-administered medications on file prior to visit.        ROS:  All others reviewed and negative.  Objective        PE:  BP 118/62 (BP Location: Right Arm, Patient Position: Sitting, Cuff Size: Normal)   Pulse 92   Temp 98.8 F (37.1 C) (Oral)   Ht 6' (1.829 m)   Wt 163 lb (73.9 kg)   SpO2 99%   BMI 22.11 kg/m                 Constitutional: Pt appears in NAD               HENT: Head: NCAT.                Right Ear: External ear normal.                 Left Ear: External ear normal.                Eyes: . Pupils are equal, round, and reactive to light. Conjunctivae and EOM are normal               Nose: without d/c or deformity               Neck: Neck supple. Gross normal ROM               Cardiovascular: Normal rate and regular rhythm.                 Pulmonary/Chest: Effort normal and breath sounds without rales or wheezing.                Abd:  Soft, NT, ND, + BS, no organomegaly               Neurological: Pt is alert. At baseline orientation, motor grossly intact               Skin: Skin is warm. , LE edema - none, large area approx 4 x 6 cm area right leg anterolat thigh with nontender erythem scaly rash slightly raised and bumpy  Psychiatric: Pt behavior is normal without agitation   Micro: none  Cardiac tracings I have personally interpreted today:  none  Pertinent Radiological findings (summarize): none   Lab Results  Component Value Date   WBC 3.6 (L) 04/14/2023    HGB 10.9 (L) 04/14/2023   HCT 32.9 (L) 04/14/2023   PLT 128 (L) 04/14/2023   GLUCOSE 83 04/14/2023   CHOL 178 02/20/2023   TRIG 84.0 02/20/2023   HDL 57.90 02/20/2023   LDLCALC 104 (H) 02/20/2023   ALT 11 04/14/2023   AST 29 04/14/2023   NA 140 04/14/2023   K 4.0 04/14/2023   CL 108 04/14/2023   CREATININE 1.49 (H) 04/14/2023   BUN 18 04/14/2023   CO2 26 04/14/2023   TSH 0.78 02/20/2023   PSA 0.34 02/20/2023   INR 1.0 03/30/2020   HGBA1C 6.3 02/20/2023   MICROALBUR 8.8 (H) 02/20/2023   Assessment/Plan:  BEN DEVANY is a 67 y.o. Black or African American [2] male with  has a past medical history of Anemia (06/24/2012), ANXIETY (02/03/2007), Cervical disc disease (02/12/2012), Erectile dysfunction (02/11/2011), GERD (gastroesophageal reflux disease), GLUCOSE INTOLERANCE (01/04/2008), HYPERLIPIDEMIA (02/03/2007), HYPERTENSION (02/03/2007), IBS (irritable bowel syndrome), Impaired glucose tolerance (02/11/2011), nscl ca (03/30/2020), Smoker (08/22/2014), and Substance abuse (HCC) (2001).  Hyperlipidemia Lab Results  Component Value Date   LDLCALC 104 (H) 02/20/2023   Uncontrolled,, pt to continue current statin crestor 40 mg every day and lower chol diet with good compliance as he admits he does not take every day   Essential hypertension BP Readings from Last 3 Encounters:  06/03/23 118/62  04/21/23 99/60  02/24/23 124/76   Stable, pt to continue medical treatment lotrel 10 20 qd   Impaired glucose tolerance Lab Results  Component Value Date   HGBA1C 6.3 02/20/2023   Stable, pt to continue current medical treatment  - diet, wt control   B12 deficiency Lab Results  Component Value Date   VITAMINB12 496 02/20/2023   Stable, cont oral replacement - b12 1000 mcg qd   Vitamin D deficiency Last vitamin D Lab Results  Component Value Date   VD25OH 63.96 02/20/2023   Stable, cont oral replacement   Rash and nonspecific skin eruption Mild to mod, c/w  dermatitis, for prednisone taper, also triam cr prn,,  to f/u any worsening symptoms or concerns  Followup: Return if symptoms worsen or fail to improve.  Oliver Barre, MD 06/06/2023 2:27 PM [facility]

## 2023-06-06 ENCOUNTER — Encounter: Payer: Self-pay | Admitting: Internal Medicine

## 2023-06-06 NOTE — Assessment & Plan Note (Signed)
BP Readings from Last 3 Encounters:  06/03/23 118/62  04/21/23 99/60  02/24/23 124/76   Stable, pt to continue medical treatment lotrel 10 20 qd

## 2023-06-06 NOTE — Assessment & Plan Note (Signed)
Last vitamin D Lab Results  Component Value Date   VD25OH 63.96 02/20/2023   Stable, cont oral replacement

## 2023-06-06 NOTE — Assessment & Plan Note (Signed)
Lab Results  Component Value Date   HGBA1C 6.3 02/20/2023   Stable, pt to continue current medical treatment  - diet,wt control

## 2023-06-06 NOTE — Assessment & Plan Note (Signed)
Lab Results  Component Value Date   VITAMINB12 496 02/20/2023   Stable, cont oral replacement - b12 1000 mcg qd

## 2023-06-06 NOTE — Assessment & Plan Note (Signed)
Mild to mod, c/w dermatitis, for prednisone taper, also triam cr prn,,  to f/u any worsening symptoms or concerns

## 2023-06-06 NOTE — Assessment & Plan Note (Signed)
Lab Results  Component Value Date   LDLCALC 104 (H) 02/20/2023   Uncontrolled,, pt to continue current statin crestor 40 mg every day and lower chol diet with good compliance as he admits he does not take every day

## 2023-07-06 ENCOUNTER — Inpatient Hospital Stay: Payer: Medicare Other | Attending: Internal Medicine

## 2023-07-06 DIAGNOSIS — Z87891 Personal history of nicotine dependence: Secondary | ICD-10-CM | POA: Diagnosis not present

## 2023-07-06 DIAGNOSIS — C3411 Malignant neoplasm of upper lobe, right bronchus or lung: Secondary | ICD-10-CM | POA: Diagnosis not present

## 2023-07-06 DIAGNOSIS — C7931 Secondary malignant neoplasm of brain: Secondary | ICD-10-CM | POA: Insufficient documentation

## 2023-07-06 DIAGNOSIS — Z452 Encounter for adjustment and management of vascular access device: Secondary | ICD-10-CM | POA: Insufficient documentation

## 2023-07-06 DIAGNOSIS — Z95828 Presence of other vascular implants and grafts: Secondary | ICD-10-CM

## 2023-07-06 MED ORDER — SODIUM CHLORIDE 0.9% FLUSH
10.0000 mL | Freq: Once | INTRAVENOUS | Status: AC
  Administered 2023-07-06: 10 mL

## 2023-07-06 MED ORDER — HEPARIN SOD (PORK) LOCK FLUSH 100 UNIT/ML IV SOLN
500.0000 [IU] | Freq: Once | INTRAVENOUS | Status: AC
Start: 2023-07-06 — End: 2023-07-06
  Administered 2023-07-06: 500 [IU]

## 2023-07-22 ENCOUNTER — Other Ambulatory Visit: Payer: Medicare Other

## 2023-07-22 ENCOUNTER — Inpatient Hospital Stay: Payer: Medicare Other | Attending: Internal Medicine

## 2023-07-22 ENCOUNTER — Ambulatory Visit (HOSPITAL_COMMUNITY)
Admission: RE | Admit: 2023-07-22 | Discharge: 2023-07-22 | Disposition: A | Discharge status: Home / Self Care | Payer: Medicare Other | Source: Ambulatory Visit | Attending: Internal Medicine | Admitting: Internal Medicine

## 2023-07-22 DIAGNOSIS — K5909 Other constipation: Secondary | ICD-10-CM | POA: Insufficient documentation

## 2023-07-22 DIAGNOSIS — C3431 Malignant neoplasm of lower lobe, right bronchus or lung: Secondary | ICD-10-CM | POA: Diagnosis not present

## 2023-07-22 DIAGNOSIS — C3411 Malignant neoplasm of upper lobe, right bronchus or lung: Secondary | ICD-10-CM | POA: Insufficient documentation

## 2023-07-22 DIAGNOSIS — Z79899 Other long term (current) drug therapy: Secondary | ICD-10-CM | POA: Diagnosis not present

## 2023-07-22 DIAGNOSIS — C3490 Malignant neoplasm of unspecified part of unspecified bronchus or lung: Secondary | ICD-10-CM

## 2023-07-22 DIAGNOSIS — Z87891 Personal history of nicotine dependence: Secondary | ICD-10-CM | POA: Insufficient documentation

## 2023-07-22 DIAGNOSIS — C3432 Malignant neoplasm of lower lobe, left bronchus or lung: Secondary | ICD-10-CM | POA: Insufficient documentation

## 2023-07-22 DIAGNOSIS — N3289 Other specified disorders of bladder: Secondary | ICD-10-CM | POA: Diagnosis not present

## 2023-07-22 DIAGNOSIS — N289 Disorder of kidney and ureter, unspecified: Secondary | ICD-10-CM | POA: Diagnosis not present

## 2023-07-22 DIAGNOSIS — R911 Solitary pulmonary nodule: Secondary | ICD-10-CM | POA: Insufficient documentation

## 2023-07-22 DIAGNOSIS — C7931 Secondary malignant neoplasm of brain: Secondary | ICD-10-CM | POA: Diagnosis not present

## 2023-07-22 LAB — CBC WITH DIFFERENTIAL (CANCER CENTER ONLY)
Abs Immature Granulocytes: 0.01 10*3/uL (ref 0.00–0.07)
Basophils Absolute: 0 10*3/uL (ref 0.0–0.1)
Basophils Relative: 1 %
Eosinophils Absolute: 0.1 10*3/uL (ref 0.0–0.5)
Eosinophils Relative: 3 %
HCT: 40.4 % (ref 39.0–52.0)
Hemoglobin: 13.3 g/dL (ref 13.0–17.0)
Immature Granulocytes: 0 %
Lymphocytes Relative: 40 %
Lymphs Abs: 2 10*3/uL (ref 0.7–4.0)
MCH: 26.4 pg (ref 26.0–34.0)
MCHC: 32.9 g/dL (ref 30.0–36.0)
MCV: 80.3 fL (ref 80.0–100.0)
Monocytes Absolute: 0.5 10*3/uL (ref 0.1–1.0)
Monocytes Relative: 10 %
Neutro Abs: 2.3 10*3/uL (ref 1.7–7.7)
Neutrophils Relative %: 46 %
Platelet Count: 128 10*3/uL — ABNORMAL LOW (ref 150–400)
RBC: 5.03 MIL/uL (ref 4.22–5.81)
RDW: 15.9 % — ABNORMAL HIGH (ref 11.5–15.5)
WBC Count: 4.9 10*3/uL (ref 4.0–10.5)
nRBC: 0 % (ref 0.0–0.2)

## 2023-07-22 LAB — CMP (CANCER CENTER ONLY)
ALT: 12 U/L (ref 0–44)
AST: 21 U/L (ref 15–41)
Albumin: 3.8 g/dL (ref 3.5–5.0)
Alkaline Phosphatase: 58 U/L (ref 38–126)
Anion gap: 7 (ref 5–15)
BUN: 17 mg/dL (ref 8–23)
CO2: 25 mmol/L (ref 22–32)
Calcium: 9.2 mg/dL (ref 8.9–10.3)
Chloride: 103 mmol/L (ref 98–111)
Creatinine: 1.62 mg/dL — ABNORMAL HIGH (ref 0.61–1.24)
GFR, Estimated: 47 mL/min — ABNORMAL LOW (ref >60)
Glucose, Bld: 87 mg/dL (ref 70–99)
Potassium: 4 mmol/L (ref 3.5–5.1)
Sodium: 135 mmol/L (ref 135–145)
Total Bilirubin: 0.4 mg/dL (ref 0.0–1.2)
Total Protein: 7.1 g/dL (ref 6.5–8.1)

## 2023-07-28 ENCOUNTER — Ambulatory Visit (HOSPITAL_COMMUNITY)
Admission: RE | Admit: 2023-07-28 | Discharge: 2023-07-28 | Disposition: A | Discharge status: Home / Self Care | Payer: Medicare Other | Source: Ambulatory Visit | Attending: Internal Medicine | Admitting: Internal Medicine

## 2023-07-28 DIAGNOSIS — C3490 Malignant neoplasm of unspecified part of unspecified bronchus or lung: Secondary | ICD-10-CM | POA: Diagnosis not present

## 2023-07-28 DIAGNOSIS — I700 Atherosclerosis of aorta: Secondary | ICD-10-CM | POA: Diagnosis not present

## 2023-07-28 DIAGNOSIS — J90 Pleural effusion, not elsewhere classified: Secondary | ICD-10-CM | POA: Diagnosis not present

## 2023-07-28 DIAGNOSIS — I3139 Other pericardial effusion (noninflammatory): Secondary | ICD-10-CM | POA: Diagnosis not present

## 2023-07-29 ENCOUNTER — Inpatient Hospital Stay (HOSPITAL_BASED_OUTPATIENT_CLINIC_OR_DEPARTMENT_OTHER): Payer: Medicare Other | Admitting: Internal Medicine

## 2023-07-29 VITALS — BP 98/68 | HR 75 | Temp 98.8°F | Resp 18 | Ht 72.0 in | Wt 169.8 lb

## 2023-07-29 DIAGNOSIS — Z87891 Personal history of nicotine dependence: Secondary | ICD-10-CM | POA: Diagnosis not present

## 2023-07-29 DIAGNOSIS — K5909 Other constipation: Secondary | ICD-10-CM | POA: Diagnosis not present

## 2023-07-29 DIAGNOSIS — C7931 Secondary malignant neoplasm of brain: Secondary | ICD-10-CM | POA: Diagnosis not present

## 2023-07-29 DIAGNOSIS — C3490 Malignant neoplasm of unspecified part of unspecified bronchus or lung: Secondary | ICD-10-CM | POA: Diagnosis not present

## 2023-07-29 DIAGNOSIS — C3431 Malignant neoplasm of lower lobe, right bronchus or lung: Secondary | ICD-10-CM | POA: Diagnosis not present

## 2023-07-29 DIAGNOSIS — R911 Solitary pulmonary nodule: Secondary | ICD-10-CM | POA: Diagnosis not present

## 2023-07-29 DIAGNOSIS — Z79899 Other long term (current) drug therapy: Secondary | ICD-10-CM | POA: Diagnosis not present

## 2023-07-29 DIAGNOSIS — N289 Disorder of kidney and ureter, unspecified: Secondary | ICD-10-CM | POA: Diagnosis not present

## 2023-07-29 DIAGNOSIS — C3432 Malignant neoplasm of lower lobe, left bronchus or lung: Secondary | ICD-10-CM | POA: Diagnosis not present

## 2023-07-29 DIAGNOSIS — C3411 Malignant neoplasm of upper lobe, right bronchus or lung: Secondary | ICD-10-CM | POA: Diagnosis not present

## 2023-07-29 NOTE — Progress Notes (Signed)
[facility] [phone]   [phone]  OFFICE PROGRESS NOTE  Nicolas Levins, MD [address] [place] [address]  DIAGNOSIS: Stage IV (T3, N2, M1c) non-small cell lung cancer favoring adenocarcinoma presented with right upper lobe lung mass in addition to right lower lobe, left lower lobe in addition to right hilar and mediastinal lymphadenopathy in addition to metastatic brain lesions diagnosed in October 2021.  Molecular studies by [employer]:  [id number], 9.5%,   PRIOR THERAPY:  1) SRS to 3 brain lesion under the care of Dr. Mitzi Hansen. 2) Systemic chemotherapy with carboplatin for AUC of 5, Alimta 500 mg/M2 and Keytruda 200 mg IV every 3 weeks.  First dose April 24, 2020.  Status post 43 cycles.  Starting from cycle #5 the patient will be on maintenance treatment with Alimta and Keytruda every 3 weeks.  Last dose  was given on 09/23/2022 discontinued secondary to renal insufficiency and worsening edema  CURRENT THERAPY: Observation.  INTERVAL HISTORY: Nicolas Allen 68 y.o. male returns to the clinic today for follow-up visit. Discussed the use of AI scribe software for clinical note transcription with the patient, who gave verbal consent to proceed.  History of Present Illness   Nicolas Allen is a 69 year old male with lung cancer who presents for follow-up and monitoring. He is accompanied by his wife, Nicolas Allen.  He has a history of lung cancer, initially diagnosed in October 2021. Treatment included carboplatin, Alimta, and Keytruda for four cycles, followed by maintenance therapy with Alimta and Keytruda, and then Keytruda alone, completing a total of 43 cycles. He has been under observation for several months without active treatment.  No new respiratory symptoms, including breathing difficulties, chest pain, or hemoptysis. He has not experienced any recent weight loss; instead, he has gained six pounds.  He experiences significant  constipation, describing bowel movements as 'real slow' and occurring approximately once a week. He uses Dulcolax as a laxative, taking two tablets to induce bowel movements when necessary. He has not been using any pain medication.        MEDICAL HISTORY: Past Medical History:  Diagnosis Date   Anemia 06/24/2012   ANXIETY 02/03/2007   Cervical disc disease 02/12/2012   S/p surgury jan 2013   Erectile dysfunction 02/11/2011   GERD (gastroesophageal reflux disease)    GLUCOSE INTOLERANCE 01/04/2008   HYPERLIPIDEMIA 02/03/2007   HYPERTENSION 02/03/2007   IBS (irritable bowel syndrome)    Impaired glucose tolerance 02/11/2011   nscl ca 03/30/2020   Smoker 08/22/2014   Substance abuse (HCC) 2001   sober for 16 yrs    ALLERGIES:  has no known allergies.  MEDICATIONS:  Current Outpatient Medications  Medication Sig Dispense Refill   amLODipine-benazepril (LOTREL) 10-20 MG capsule Take 1 capsule by mouth once daily 90 capsule 3   aspirin 325 MG EC tablet Take 1 tablet (325 mg total) by mouth daily. 90 tablet 99   b complex vitamins tablet Take 1 tablet by mouth daily.     cholecalciferol (VITAMIN D3) 25 MCG (1000 UNIT) tablet Take 1,000 Units by mouth daily.     folic acid (FOLVITE) 1 MG tablet Take 1 tablet by mouth once daily 90 tablet 3   Garlic 1000 MG CAPS Take 1,000 mg by mouth daily.      lidocaine-prilocaine (EMLA) cream Apply to the Port-A-Cath site 30-60 minutes before chemotherapy. 30 g 0   Omega-3 Fatty Acids (FISH OIL  PO) Take 1 capsule by mouth daily.      polyethylene glycol (MIRALAX / GLYCOLAX) 17 g packet Take 17 g by mouth daily as needed.     predniSONE (DELTASONE) 10 MG tablet 3 tabs by mouth per day for 3 days,2tabs per day for 3 days,1tab per day for 3 days 18 tablet 0   prochlorperazine (COMPAZINE) 10 MG tablet Take 1 tablet (10 mg total) by mouth every 6 (six) hours as needed for nausea or vomiting. 30 tablet 0   rosuvastatin (CRESTOR) 40 MG tablet Take 1  tablet (40 mg total) by mouth daily. 90 tablet 3   triamcinolone cream (KENALOG) 0.1 % Apply 1 Application topically 2 (two) times daily. 30 g 1   vardenafil (LEVITRA) 20 MG tablet TAKE 1 TABLET BY MOUTH AS NEEDED FOR  ERECTILE  DYSFUNCTION 10 tablet 5   No current facility-administered medications for this visit.    SURGICAL HISTORY:  Past Surgical History:  Procedure Laterality Date   BRONCHIAL BIOPSY  03/30/2020   Procedure: BRONCHIAL BIOPSIES;  Surgeon: Josephine Igo, DO;  Location: [facility];  Service: Pulmonary;;   BRONCHIAL BRUSHINGS  03/30/2020   Procedure: BRONCHIAL BRUSHINGS;  Surgeon: Josephine Igo, DO;  Location: [facility];  Service: Pulmonary;;   BRONCHIAL NEEDLE ASPIRATION BIOPSY  03/30/2020   Procedure: BRONCHIAL NEEDLE ASPIRATION BIOPSIES;  Surgeon: Josephine Igo, DO;  Location: [facility];  Service: Pulmonary;;   BRONCHIAL WASHINGS  03/30/2020   Procedure: BRONCHIAL WASHINGS;  Surgeon: Josephine Igo, DO;  Location: [facility];  Service: Pulmonary;;   COLONOSCOPY  2007   IR IMAGING GUIDED PORT INSERTION  04/23/2020   neck fusion  2012   C4   NO PAST SURGERIES     VIDEO BRONCHOSCOPY WITH ENDOBRONCHIAL NAVIGATION N/A 03/30/2020   Procedure: VIDEO BRONCHOSCOPY WITH ENDOBRONCHIAL NAVIGATION;  Surgeon: Josephine Igo, DO;  Location: [facility];  Service: Pulmonary;  Laterality: N/A;   VIDEO BRONCHOSCOPY WITH ENDOBRONCHIAL ULTRASOUND N/A 03/30/2020   Procedure: VIDEO BRONCHOSCOPY WITH ENDOBRONCHIAL ULTRASOUND;  Surgeon: Josephine Igo, DO;  Location: [facility];  Service: Pulmonary;  Laterality: N/A;    REVIEW OF SYSTEMS:  Constitutional: positive for fatigue Eyes: negative Ears, nose, mouth, throat, and face: negative Respiratory: negative Cardiovascular: negative Gastrointestinal: positive for constipation Genitourinary:negative Integument/breast: negative Hematologic/lymphatic: negative Musculoskeletal:negative Neurological:  negative Behavioral/Psych: negative Endocrine: negative Allergic/Immunologic: negative   PHYSICAL EXAMINATION: General appearance: alert, cooperative, fatigued, and no distress Head: Normocephalic, without obvious abnormality, atraumatic Neck: no adenopathy, no JVD, supple, symmetrical, trachea midline, and thyroid not enlarged, symmetric, no tenderness/mass/nodules Lymph nodes: Cervical, supraclavicular, and axillary nodes normal. Resp: clear to auscultation bilaterally Back: symmetric, no curvature. ROM normal. No CVA tenderness. Cardio: regular rate and rhythm, S1, S2 normal, no murmur, click, rub or gallop GI: soft, non-tender; bowel sounds normal; no masses,  no organomegaly Extremities: extremities normal, atraumatic, no cyanosis or edema Neurologic: Alert and oriented X 3, normal strength and tone. Normal symmetric reflexes. Normal coordination and gait  ECOG PERFORMANCE STATUS: 1 - Symptomatic but completely ambulatory  Blood pressure 98/68, pulse 75, temperature 98.8 F (37.1 C), resp. rate 18, height 6' (1.829 m), weight 169 lb 12.8 oz (77 kg), SpO2 100%.  LABORATORY DATA: Lab Results  Component Value Date   WBC 4.9 07/22/2023   HGB 13.3 07/22/2023   HCT 40.4 07/22/2023   MCV 80.3 07/22/2023   PLT 128 (L) 07/22/2023      Chemistry      Component Value Date/Time  NA 135 07/22/2023 1510   K 4.0 07/22/2023 1510   CL 103 07/22/2023 1510   CO2 25 07/22/2023 1510   BUN 17 07/22/2023 1510   CREATININE 1.62 (H) 07/22/2023 1510   CREATININE 0.88 02/21/2020 1115      Component Value Date/Time   CALCIUM 9.2 07/22/2023 1510   ALKPHOS 58 07/22/2023 1510   AST 21 07/22/2023 1510   ALT 12 07/22/2023 1510   BILITOT 0.4 07/22/2023 1510       RADIOGRAPHIC STUDIES: CT ABDOMEN PELVIS WO CONTRAST Result Date: 07/29/2023 CLINICAL DATA:  Non-small cell lung cancer, staging. * Tracking Code: BO * EXAM: CT ABDOMEN AND PELVIS WITHOUT CONTRAST TECHNIQUE: Multidetector CT imaging  of the abdomen and pelvis was performed following the standard protocol without IV contrast. RADIATION DOSE REDUCTION: This exam was performed according to the departmental dose-optimization program which includes automated exposure control, adjustment of the mA and/or kV according to patient size and/or use of iterative reconstruction technique. COMPARISON:  04/14/2023. FINDINGS: Lower chest: A septated atypical pulmonary cyst with eccentric wall thickening in the posterolateral left lower lobe measures 3.7 x 6.3 cm (4/20), enlarged from 3.4 x 5.1 cm on 04/14/2023. Small right pleural effusion. Heart size normal. No pericardial effusion. Distal esophagus is grossly unremarkable. Hepatobiliary: Liver and gallbladder are unremarkable. No biliary ductal dilatation. Pancreas: 7 x 13 mm low-attenuation lesion in the uncinate process (2/28), present dating back to 04/02/2020. Spleen: Negative. Adrenals/Urinary Tract: Adrenal glands and kidneys are unremarkable. Ureters are decompressed. Bladder is somewhat thick-walled. Stomach/Bowel: Stomach, small bowel, appendix and colon are unremarkable. Vascular/Lymphatic: Atherosclerotic calcification of the aorta. No pathologically enlarged lymph nodes. Reproductive: Prostate is visualized. Other: No free fluid.  Mesenteries and peritoneum are unremarkable. Musculoskeletal: Degenerative changes in the spine. No worrisome lytic or sclerotic lesions. IMPRESSION: 1. Enlarging atypical pulmonary cyst in the left lower lobe, worrisome for adenocarcinoma. No evidence of metastatic disease in the abdomen or pelvis. 2. Small right pleural effusion. 3. 7 x 13 mm low-attenuation lesion in the uncinate process, unchanging back to 04/02/2020. Recommend continued attention on follow-up. 4. Slight bladder wall thickening. 5.  Aortic atherosclerosis (ICD10-I70.0). Electronically Signed   By: Leanna Battles M.D.   On: 07/29/2023 10:44     ASSESSMENT AND PLAN: This is a very pleasant 68  years old African-American male recently diagnosed with stage IV (T3, N2, M1c)  non-small cell lung cancer, adenocarcinoma presented with right upper lobe lung mass in addition to right lower lobe, left lower lobe in addition to right hilar and mediastinal lymphadenopathy in addition to metastatic brain lesions diagnosed in October 2021. The patient has molecular studies by [employer] that showed no actionable mutations. He underwent SRS treatment to his brain lesion. The patient underwent systemic chemotherapy with carboplatin for AUC of 5, Alimta 500 mg/M2 and Keytruda 200 mg IV every 3 weeks status post 43 cycles.  Starting from cycle #5 the patient is on maintenance treatment with Alimta and Keytruda every 3 weeks. He discontinued maintenance Alimta and Keytruda at this point because of the renal insufficiency, worsening anemia and edema. He underwent SBRT to the enlarging right middle lobe lung nodule under the care of Dr. Mitzi Hansen.  He will have a Port-A-Cath flush every 6 weeks. For the worsening renal insufficiency, he was referred to nephrology for evaluation and management of his condition. The patient had repeat CT scan of the chest, abdomen and pelvis performed recently.  I personally and independently reviewed the scan images and discussed the  result with the patient and his wife.  His scan showed stable disease except for the enlarging atypical pulmonary cyst in the left lower lobe that still concerning for possibility of adenocarcinoma.    Non-Small Cell Lung Cancer (NSCLC) 68 year old male with stage IV NSCLC diagnosed in October 2021. Completed 43 cycles of Keytruda. Recent scan shows a slight increase in the size of a cystic area in the left lower lobe, potentially suspicious for cancer growth. No new symptoms such as chest pain, dyspnea, or hemoptysis. Weight gain of 6 pounds noted. Plan to continue observation and rescan in three months. If the area becomes more solid or aggressive,  treatment will be reconsidered. - Continue observation - Repeat chest, abdomen, and pelvis scan in 3 months - Order labs one week before next visit  Chronic Constipation Bowel movements occurring once a week, requiring Dulcolax. No current use of pain medications. Symptoms have worsened recently. Discussed using Dulcolax every other day if no bowel movement for 1-2 days and the option of using Miralax if needed. Continue Metamucil daily. - Recommend using Dulcolax every other day if no bowel movement for 1-2 days - Consider using Miralax if needed - Continue Metamucil daily  Follow-up - Schedule follow-up visit in three months - Perform scan and labs one week before the next visit.   He was advised to call immediately if he has any concerning symptoms in the interval. The patient voices understanding of current disease status and treatment options and is in agreement with the current care plan.  All questions were answered. The patient knows to call the clinic with any problems, questions or concerns. We can certainly see the patient much sooner if necessary. The total time spent in the appointment was 30 minutes.  Disclaimer: This note was dictated with voice recognition software. Similar sounding words can inadvertently be transcribed and may not be corrected upon review.

## 2023-08-17 ENCOUNTER — Inpatient Hospital Stay: Payer: Medicare Other | Attending: Internal Medicine

## 2023-08-17 DIAGNOSIS — C3411 Malignant neoplasm of upper lobe, right bronchus or lung: Secondary | ICD-10-CM | POA: Diagnosis not present

## 2023-08-17 DIAGNOSIS — Z87891 Personal history of nicotine dependence: Secondary | ICD-10-CM | POA: Insufficient documentation

## 2023-08-17 DIAGNOSIS — C7931 Secondary malignant neoplasm of brain: Secondary | ICD-10-CM | POA: Insufficient documentation

## 2023-08-17 DIAGNOSIS — Z452 Encounter for adjustment and management of vascular access device: Secondary | ICD-10-CM | POA: Diagnosis not present

## 2023-08-17 DIAGNOSIS — Z95828 Presence of other vascular implants and grafts: Secondary | ICD-10-CM

## 2023-08-17 MED ORDER — SODIUM CHLORIDE 0.9% FLUSH
10.0000 mL | Freq: Once | INTRAVENOUS | Status: AC
  Administered 2023-08-17: 10 mL

## 2023-08-17 MED ORDER — HEPARIN SOD (PORK) LOCK FLUSH 100 UNIT/ML IV SOLN
500.0000 [IU] | Freq: Once | INTRAVENOUS | Status: AC
  Administered 2023-08-17: 500 [IU]

## 2023-08-18 ENCOUNTER — Ambulatory Visit
Admission: RE | Admit: 2023-08-18 | Discharge: 2023-08-18 | Disposition: A | Discharge status: Home / Self Care | Payer: Medicare Other | Source: Ambulatory Visit | Attending: Radiation Oncology | Admitting: Radiation Oncology

## 2023-08-18 DIAGNOSIS — Z85118 Personal history of other malignant neoplasm of bronchus and lung: Secondary | ICD-10-CM | POA: Diagnosis not present

## 2023-08-18 DIAGNOSIS — C7931 Secondary malignant neoplasm of brain: Secondary | ICD-10-CM

## 2023-08-18 MED ORDER — GADOPICLENOL 0.5 MMOL/ML IV SOLN
8.0000 mL | Freq: Once | INTRAVENOUS | Status: AC | PRN
  Administered 2023-08-18: 8 mL via INTRAVENOUS

## 2023-08-18 MED ORDER — SODIUM CHLORIDE 0.9% FLUSH
10.0000 mL | INTRAVENOUS | Status: DC | PRN
  Administered 2023-08-18: 10 mL via INTRAVENOUS

## 2023-08-18 MED ORDER — HEPARIN SOD (PORK) LOCK FLUSH 100 UNIT/ML IV SOLN
500.0000 [IU] | Freq: Once | INTRAVENOUS | Status: AC
  Administered 2023-08-18: 500 [IU] via INTRAVENOUS

## 2023-08-24 ENCOUNTER — Other Ambulatory Visit: Payer: Self-pay | Admitting: Radiation Therapy

## 2023-08-24 ENCOUNTER — Ambulatory Visit
Admission: RE | Admit: 2023-08-24 | Discharge: 2023-08-24 | Disposition: A | Discharge status: Home / Self Care | Payer: Medicare Other | Source: Ambulatory Visit | Attending: Radiation Oncology | Admitting: Radiation Oncology

## 2023-08-24 ENCOUNTER — Encounter: Payer: Self-pay | Admitting: Radiation Oncology

## 2023-08-24 DIAGNOSIS — C7931 Secondary malignant neoplasm of brain: Secondary | ICD-10-CM

## 2023-08-24 DIAGNOSIS — C3482 Malignant neoplasm of overlapping sites of left bronchus and lung: Secondary | ICD-10-CM | POA: Diagnosis not present

## 2023-08-24 DIAGNOSIS — C3481 Malignant neoplasm of overlapping sites of right bronchus and lung: Secondary | ICD-10-CM | POA: Diagnosis not present

## 2023-08-24 DIAGNOSIS — Z87891 Personal history of nicotine dependence: Secondary | ICD-10-CM | POA: Diagnosis not present

## 2023-08-24 DIAGNOSIS — C3491 Malignant neoplasm of unspecified part of right bronchus or lung: Secondary | ICD-10-CM

## 2023-08-24 NOTE — Progress Notes (Signed)
 Radiation Oncology         [phone] ________________________________  Outpatient follow-up- Conducted via telephone at patient request.  I spoke with the patient to conduct this visit via telephone. The patient was notified in advance and was offered an in person or telemedicine meeting to allow for face to face communication but instead preferred to proceed with a telephone visit.    Name: Nicolas Allen        MRN: [id number]  Date of Service: 08/24/2023 DOB: 06-Apr-1956  WU:[id number], Len Blalock, MD  Corwin Levins, MD     REFERRING PHYSICIAN: Corwin Levins, MD   DIAGNOSIS: The primary encounter diagnosis was Malignant neoplasm metastatic to brain [facility]). A diagnosis of Non-small cell carcinoma of right lung, stage 4 (HCC) was also pertinent to this visit.   HISTORY OF PRESENT ILLNESS: Nicolas Allen is a 68 y.o. male with a history of stage IV non-small cell lung cancer, adenocarcinoma involving multifocal disease in the lung and 3 brain metastases. He was diagnosed in the fall of 2021 and found to have metastatic disease in the lung and brain for which he has received stereotactic radiosurgery [facility]) with Dr. Mitzi Hansen.  While he did have pseudoprogression due to his immunotherapy, there was growth in the same mid cerebellar lesion and after discussion in brain oncology conference, it was felt he should pursue re-irradiation which was completed with SRS  on 02/12/22. He completed consolidative chemo/immunotherapy on 09/25/22 with Dr. Arbutus Ped.   A CT CAP for surveillance was performed on 01/16/23. This showed an increase in a nodule that was being followed in the RML. It is pleural based, and measures 6.8 mm, previously 6.5 mm in April 2024, previously 4.4 mm in February 2024.  He went on to receive stereotactic body radiotherapy (SBRT) to this site which he completed on 02/17/23.   He has been followed with surveillance MRIs as well since his original treatment with SRS and then subsequent  reirradiation in August 2023. He also continues in surveillance with Dr. Arbutus Ped He will have a repeat CT in about 2 months for follow up to continue to evaluate a cystic area in the LLL in addition to for restaging purposes.  Of note an MRI brain on 05/13/23 showed a 2 mm area of enhancement in the right cerebellar hemisphere, which was a part of his previous treatment after discussion in multidisciplinary brain oncology conference. His most recent MRI brain on 08/18/23 showed a 3 mm enhancing lesion in the lateral right cerebellar hemisphere, slightly increased since November 2024, but after fusion review by physics, this area was completely treated. In addition the vermian lesion measured 2 cm and again is unchanged.  He is contacted to review these results.   PREVIOUS RADIATION THERAPY:  02/10/23-02/17/23 SBRT Treatment The tumor in the RML was treated with a course of stereotactic body radiation treatment. The patient will complete his therapy next week and will receive a total of 54 Gy In 3 fractions at 18 G per fraction.  02/12/2022 through 02/12/2022 SRS Treatment Site Technique Total Dose (Gy) Dose per Fx (Gy) Completed Fx Beam Energies  Brain:  [id number]_69mm Mid Cerebellum IMRT 20/20 20 1/1 6XFFF    04/20/20 SRS Treatment: Each site below was treated to 20 Gy in 1 fraction PTV1 Rt Cerebellum 4mm PTV2Mid cerebellum 11mm PTV3Lt Parietal 6mm    PAST MEDICAL HISTORY:  Past Medical History:  Diagnosis Date   Anemia 06/24/2012   ANXIETY 02/03/2007   Cervical disc  disease 02/12/2012   S/p surgury jan 2013   Erectile dysfunction 02/11/2011   GERD (gastroesophageal reflux disease)    GLUCOSE INTOLERANCE 01/04/2008   HYPERLIPIDEMIA 02/03/2007   HYPERTENSION 02/03/2007   IBS (irritable bowel syndrome)    Impaired glucose tolerance 02/11/2011   nscl ca 03/30/2020   Smoker 08/22/2014   Substance abuse (HCC) 2001   sober for 16 yrs       PAST SURGICAL HISTORY: Past Surgical  History:  Procedure Laterality Date   BRONCHIAL BIOPSY  03/30/2020   Procedure: BRONCHIAL BIOPSIES;  Surgeon: Josephine Igo, DO;  Location: [facility];  Service: Pulmonary;;   BRONCHIAL BRUSHINGS  03/30/2020   Procedure: BRONCHIAL BRUSHINGS;  Surgeon: Josephine Igo, DO;  Location: [facility];  Service: Pulmonary;;   BRONCHIAL NEEDLE ASPIRATION BIOPSY  03/30/2020   Procedure: BRONCHIAL NEEDLE ASPIRATION BIOPSIES;  Surgeon: Josephine Igo, DO;  Location: [facility];  Service: Pulmonary;;   BRONCHIAL WASHINGS  03/30/2020   Procedure: BRONCHIAL WASHINGS;  Surgeon: Josephine Igo, DO;  Location: [facility];  Service: Pulmonary;;   COLONOSCOPY  2007   IR IMAGING GUIDED PORT INSERTION  04/23/2020   neck fusion  2012   C4   NO PAST SURGERIES     VIDEO BRONCHOSCOPY WITH ENDOBRONCHIAL NAVIGATION N/A 03/30/2020   Procedure: VIDEO BRONCHOSCOPY WITH ENDOBRONCHIAL NAVIGATION;  Surgeon: Josephine Igo, DO;  Location: [facility];  Service: Pulmonary;  Laterality: N/A;   VIDEO BRONCHOSCOPY WITH ENDOBRONCHIAL ULTRASOUND N/A 03/30/2020   Procedure: VIDEO BRONCHOSCOPY WITH ENDOBRONCHIAL ULTRASOUND;  Surgeon: Josephine Igo, DO;  Location: [facility];  Service: Pulmonary;  Laterality: N/A;     FAMILY HISTORY:  Family History  Problem Relation Age of Onset   Cancer Mother        esophagus   Cancer Cousin    Stroke Other    Hypertension Other    Colon cancer Neg Hx      SOCIAL HISTORY:  reports that he quit smoking about 3 years ago. His smoking use included cigarettes. He started smoking about 33 years ago. He has a 45 pack-year smoking history. He has quit using smokeless tobacco.  His smokeless tobacco use included chew. He reports that he does not drink alcohol and does not use drugs. The patient is married and resides in [place]. 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    ALLERGIES:  Patient has no known allergies.   MEDICATIONS:  Current Outpatient Medications  Medication Sig Dispense Refill   amLODipine-benazepril (LOTREL) 10-20 MG capsule Take 1 capsule by mouth once daily 90 capsule 3   aspirin 325 MG EC tablet Take 1 tablet (325 mg total) by mouth daily. 90 tablet 99   b complex vitamins tablet Take 1 tablet by mouth daily.     cholecalciferol (VITAMIN D3) 25 MCG (1000 UNIT) tablet Take 1,000 Units by mouth daily.     folic acid (FOLVITE) 1 MG tablet Take 1 tablet by mouth once daily 90 tablet 3   Garlic 1000 MG CAPS Take 1,000 mg by mouth daily.      lidocaine-prilocaine (EMLA) cream Apply to the Port-A-Cath site 30-60 minutes before chemotherapy. 30 g 0   Omega-3 Fatty Acids (FISH OIL PO) Take 1 capsule by mouth daily.      polyethylene glycol (MIRALAX / GLYCOLAX) 17 g packet Take 17 g by mouth daily as needed.     predniSONE (DELTASONE) 10 MG  tablet 3 tabs by mouth per day for 3 days,2tabs per day for 3 days,1tab per day for 3 days 18 tablet 0   prochlorperazine (COMPAZINE) 10 MG tablet Take 1 tablet (10 mg total) by mouth every 6 (six) hours as needed for nausea or vomiting. 30 tablet 0   rosuvastatin (CRESTOR) 40 MG tablet Take 1 tablet (40 mg total) by mouth daily. 90 tablet 3   triamcinolone cream (KENALOG) 0.1 % Apply 1 Application topically 2 (two) times daily. 30 g 1   vardenafil (LEVITRA) 20 MG tablet TAKE 1 TABLET BY MOUTH AS NEEDED FOR  ERECTILE  DYSFUNCTION 10 tablet 5   No current facility-administered medications for this visit.     REVIEW OF SYSTEMS: On review of systems, the patient reports he is doing well overall. He is having follow up with Dr. Marene Lenz in April as well to follow up following resection of his parotid tumor. He denies any nausea, headaches, difficulty with movement or coordination or dizziness.  No other complaints are verbalized.  PHYSICAL EXAM:  Unable to assess given encounter type.    ECOG = 0  0 - Asymptomatic  (Fully active, able to carry on all predisease activities without restriction)  1 - Symptomatic but completely ambulatory (Restricted in physically strenuous activity but ambulatory and able to carry out work of a light or sedentary nature. For example, light housework, office work)  2 - Symptomatic, <50% in bed during the day (Ambulatory and capable of all self care but unable to carry out any work activities. Up and about more than 50% of waking hours)  3 - Symptomatic, >50% in bed, but not bedbound (Capable of only limited self-care, confined to bed or chair 50% or more of waking hours)  4 - Bedbound (Completely disabled. Cannot carry on any self-care. Totally confined to bed or chair)  5 - Death   Santiago Glad MM, Creech RH, Tormey DC, et al. [id number]). "Toxicity and response criteria of the [facility]". Am. Evlyn Clines. Oncol. 5 (6): 649-55    LABORATORY DATA:  Lab Results  Component Value Date   WBC 4.9 07/22/2023   HGB 13.3 07/22/2023   HCT 40.4 07/22/2023   MCV 80.3 07/22/2023   PLT 128 (L) 07/22/2023   Lab Results  Component Value Date   NA 135 07/22/2023   K 4.0 07/22/2023   CL 103 07/22/2023   CO2 25 07/22/2023   Lab Results  Component Value Date   ALT 12 07/22/2023   AST 21 07/22/2023   ALKPHOS 58 07/22/2023   BILITOT 0.4 07/22/2023      RADIOGRAPHY: MR Brain W Wo Contrast Result Date: 08/19/2023 CLINICAL DATA:  Provided history: Metastasis to brain. Brain metastases, assess treatment response. Additional history provided: History of lung cancer and radiation therapy. EXAM: MRI HEAD WITHOUT AND WITH CONTRAST TECHNIQUE: Multiplanar, multiecho pulse sequences of the brain and surrounding structures were obtained without and with intravenous contrast. CONTRAST:  8 mL Vueway intravenous contrast. COMPARISON:  Brain MRI 05/13/2023. FINDINGS: Brain: Mild generalized parenchymal atrophy. A peripherally enhancing lesion within the cerebellar vermis has not  significantly changed since the prior brain MRI of 05/13/2023, again measuring 1.8 x 2.0 cm in transaxial dimensions (series 13, image 48). As before, there is chronic hemosiderin deposition at this site. Mild surrounding edema has also remained stable. A 3 mm enhancing lesion within the lateral right cerebellar hemisphere has slightly increased in size (previously 2 mm) (series 13, image 48). No significant edema  at this site. Punctate enhancing focus within the right superior frontal gyrus, unchanged (series 13, image 135). No significant edema at this site. No new enhancing lesion is identified. Moderate multifocal T2 FLAIR hyperintense signal abnormality elsewhere within the cerebral white matter, nonspecific but compatible with chronic small vessel disease. There is no acute infarct. No extra-axial fluid collection. No midline shift. Vascular: Maintained flow voids within the proximal large arterial vessels. Small left parietal lobe developmental venous anomaly (anatomic variant). Skull and upper cervical spine: No focal worrisome marrow lesion. Sinuses/Orbits: No mass or acute finding within the imaged orbits. 14 mm mucous retention cyst within the right maxillary sinus. Other: 2.5 cm right parotid gland mass, unchanged. Small-volume fluid within bilateral mastoid air cells. IMPRESSION: 1. A 3 mm enhancing lesion within the lateral right cerebellar hemisphere has slightly increased in size since the MRI of 05/13/2023 (previously 2 mm). 2. 1.8 x 2.0 cm cerebellar vermis lesion with mild surrounding edema, unchanged. 3. No new intracranial metastasis identified. Electronically Signed   By: Jackey Loge D.O.   On: 08/19/2023 10:44   CT Chest Wo Contrast Result Date: 07/29/2023 CLINICAL DATA:  Non-small cell lung cancer, staging. * Tracking Code: BO * EXAM: CT CHEST WITHOUT CONTRAST TECHNIQUE: Multidetector CT imaging of the chest was performed following the standard protocol without IV contrast. RADIATION DOSE  REDUCTION: This exam was performed according to the departmental dose-optimization program which includes automated exposure control, adjustment of the mA and/or kV according to patient size and/or use of iterative reconstruction technique. COMPARISON:  CT abdomen 07/22/2023 and CT chest 04/14/2023. FINDINGS: Cardiovascular: Right IJ Port-A-Cath terminates in the low SVC. Atherosclerotic calcification of the aorta and aortic valve. Heart size normal. Small pericardial effusion, similar. Mediastinum/Nodes: Left anterior scalene lymph node has enlarged, now measuring 2.1 cm (2/16), previously 7 mm. No pathologically enlarged mediastinal or axillary lymph nodes. Hilar regions are difficult to definitively evaluate without IV contrast. Esophagus is grossly unremarkable. Lungs/Pleura: Centrilobular and paraseptal emphysema. Numerous solid, part solid and ground-glass nodules bilaterally, most of which are stable in the interval. There is new consolidation and ground-glass in the lateral segment right middle lobe, at the site of previously seen 7 mm nodule. Thick-walled atypical pulmonary cystic mass in the posterolateral left lower lobe measures 4.0 x 6.7 cm (5/127), enlarged from 3.4 x 5.2 cm on 04/14/2023. Small right pleural effusion. Trace left pleural effusion. Airway is unremarkable. Upper Abdomen: Left upper quadrant ascites is new from 07/22/2023. Visualized portions of the liver, adrenal glands, kidneys, spleen, pancreas, stomach and bowel are otherwise grossly unremarkable. No upper abdominal adenopathy. Probable sebaceous cysts along the ventral upper abdominal wall. Musculoskeletal: Degenerative changes in the spine. No worrisome lytic or sclerotic lesions. IMPRESSION: 1. Enlarging thick-walled atypical pulmonary cystic mass in the left lower lobe, indicative of adenocarcinoma. 2. Enlarging left anterior scalene lymph node, consistent with metastatic disease. 3. Multiple additional pulmonary nodules, ranging  from solid to ground-glass in attenuation, stable and indicative of multifocal adenocarcinoma. 4. New ground-glass and consolidation in the right middle lobe, at the site of previously seen 7 mm nodule, possibly due to intervening radiation therapy. 5. Left upper quadrant ascites is new from 07/22/2023. 6. Small right pleural effusion.  Trace left pleural effusion. 7.  Aortic atherosclerosis (ICD10-I70.0). 8. Small pericardial effusion, similar. 9.  Emphysema (ICD10-J43.9). Electronically Signed   By: Leanna Battles M.D.   On: 07/29/2023 10:54        IMPRESSION/PLAN: 1. Stage IV non-small cell lung cancer, adenocarcinoma  involving multifocal disease in the lung and brain metastases.  The patient continues to do well clinically and despite the inflammatory changes seen in the brain, the patient is overall asymptomatic. He remains in surveillance with Dr. Arbutus Ped.  We discussed considering the option to move his surveillance to 9-month intervals which he is in agreement of.  He will contact us sooner if he has questions or concerns prior to that visit. 2. Right parotid pleomorphic adenoma. While his biopsy in 2023 was benign, he will see Dr. Marene Lenz on an annual basis due to risks of malignant transformation. He will follow-up next month as well.     This encounter was conducted via telephone.  The patient has provided two factor identification and has given verbal consent for this type of encounter and has been advised to only accept a meeting of this type in a secure network environment. The time spent during this encounter was 30 minutes including preparation, discussion, and coordination of the patient's care. The attendants for this meeting include  Ronny Bacon  and Earl Lites and Reinaldo Raddle.  During the encounter,  Ronny Bacon was located remotely at home. Nicolas Allen was located at home with his wife Nicolas Allen.       Osker Mason, PAC

## 2023-08-24 NOTE — Progress Notes (Signed)
 Telephone nursing appointment for review of most recent MRI results. I verified patient's identity x2 and began nursing interview.   Patient reports doing well. Patient denies any related issues at this time.   Meaningful use complete.   Patient aware of their 2pm 08/24/23 telephone appointment w/ Laurence Aly PA-C. I left my extension [phone] in case patient needs anything. Patient verbalized understanding. This concludes the nursing interview.   Patient contact [phone]     Ruel Favors, LPN

## 2023-08-26 ENCOUNTER — Ambulatory Visit (INDEPENDENT_AMBULATORY_CARE_PROVIDER_SITE_OTHER): Payer: Medicare Other | Admitting: Internal Medicine

## 2023-08-26 ENCOUNTER — Encounter: Payer: Self-pay | Admitting: Internal Medicine

## 2023-08-26 VITALS — BP 120/66 | HR 82 | Temp 98.3°F | Ht 72.0 in | Wt 168.0 lb

## 2023-08-26 DIAGNOSIS — I10 Essential (primary) hypertension: Secondary | ICD-10-CM | POA: Diagnosis not present

## 2023-08-26 DIAGNOSIS — R7302 Impaired glucose tolerance (oral): Secondary | ICD-10-CM | POA: Diagnosis not present

## 2023-08-26 DIAGNOSIS — Z0001 Encounter for general adult medical examination with abnormal findings: Secondary | ICD-10-CM | POA: Diagnosis not present

## 2023-08-26 DIAGNOSIS — R3129 Other microscopic hematuria: Secondary | ICD-10-CM | POA: Diagnosis not present

## 2023-08-26 DIAGNOSIS — E559 Vitamin D deficiency, unspecified: Secondary | ICD-10-CM

## 2023-08-26 DIAGNOSIS — E7800 Pure hypercholesterolemia, unspecified: Secondary | ICD-10-CM

## 2023-08-26 DIAGNOSIS — E538 Deficiency of other specified B group vitamins: Secondary | ICD-10-CM

## 2023-08-26 MED ORDER — ROSUVASTATIN CALCIUM 40 MG PO TABS: 40.0000 mg | ORAL_TABLET | Freq: Every day | ORAL | 3 refills | Status: AC

## 2023-08-26 MED ORDER — AMLODIPINE BESY-BENAZEPRIL HCL 10-20 MG PO CAPS: ORAL_CAPSULE | ORAL | 3 refills | Status: DC

## 2023-08-26 NOTE — Progress Notes (Signed)
 Patient ID: Nicolas Allen, male   DOB: 05/08/1956, 68 y.o.   MRN: [id number]         Chief Complaint:: wellness exam and htn, hld, hyperglycemia, low vit d       HPI:  Nicolas Allen is a 68 y.o. male here for wellness exam; decliens flu shot, o/w up to date                        Also Pt denies chest pain, increased sob or doe, wheezing, orthopnea, PND, increased LE swelling, palpitations, dizziness or syncope.   Pt denies polydipsia, polyuria, or new focal neuro s/s.   Pt denies fever, wt loss, night sweats, loss of appetite, or other constitutional symptoms  Denies urinary symptoms such as dysuria, frequency, urgency, flank pain, hematuria or n/v, fever, chills.   Wt Readings from Last 3 Encounters:  08/26/23 168 lb (76.2 kg)  07/29/23 169 lb 12.8 oz (77 kg)  06/03/23 163 lb (73.9 kg)   BP Readings from Last 3 Encounters:  08/26/23 120/66  07/29/23 98/68  06/03/23 118/62   Immunization History  Administered Date(s) Administered   Fluad Quad(high Dose 65+) 02/27/2022   Influenza,inj,Quad PF,6+ Mos 06/30/2018, 02/24/2020, 02/26/2021   PFIZER(Purple Top)SARS-COV-2 Vaccination 09/02/2019, 09/27/2019, 04/09/2020   PNEUMOCOCCAL CONJUGATE-20 02/27/2022   Td 12/13/2008   Tdap 02/23/2019   Zoster Recombinant(Shingrix) 02/26/2021, 04/29/2021   There are no preventive care reminders to display for this patient.     Past Medical History:  Diagnosis Date   Anemia 06/24/2012   ANXIETY 02/03/2007   Cervical disc disease 02/12/2012   S/p surgury jan 2013   Erectile dysfunction 02/11/2011   GERD (gastroesophageal reflux disease)    GLUCOSE INTOLERANCE 01/04/2008   HYPERLIPIDEMIA 02/03/2007   HYPERTENSION 02/03/2007   IBS (irritable bowel syndrome)    Impaired glucose tolerance 02/11/2011   nscl ca 03/30/2020   Smoker 08/22/2014   Substance abuse (HCC) 2001   sober for 16 yrs   Past Surgical History:  Procedure Laterality Date   BRONCHIAL BIOPSY  03/30/2020   Procedure:  BRONCHIAL BIOPSIES;  Surgeon: Josephine Igo, DO;  Location: [facility];  Service: Pulmonary;;   BRONCHIAL BRUSHINGS  03/30/2020   Procedure: BRONCHIAL BRUSHINGS;  Surgeon: Josephine Igo, DO;  Location: [facility];  Service: Pulmonary;;   BRONCHIAL NEEDLE ASPIRATION BIOPSY  03/30/2020   Procedure: BRONCHIAL NEEDLE ASPIRATION BIOPSIES;  Surgeon: Josephine Igo, DO;  Location: [facility];  Service: Pulmonary;;   BRONCHIAL WASHINGS  03/30/2020   Procedure: BRONCHIAL WASHINGS;  Surgeon: Josephine Igo, DO;  Location: [facility];  Service: Pulmonary;;   COLONOSCOPY  2007   IR IMAGING GUIDED PORT INSERTION  04/23/2020   neck fusion  2012   C4   NO PAST SURGERIES     VIDEO BRONCHOSCOPY WITH ENDOBRONCHIAL NAVIGATION N/A 03/30/2020   Procedure: VIDEO BRONCHOSCOPY WITH ENDOBRONCHIAL NAVIGATION;  Surgeon: Josephine Igo, DO;  Location: [facility];  Service: Pulmonary;  Laterality: N/A;   VIDEO BRONCHOSCOPY WITH ENDOBRONCHIAL ULTRASOUND N/A 03/30/2020   Procedure: VIDEO BRONCHOSCOPY WITH ENDOBRONCHIAL ULTRASOUND;  Surgeon: Josephine Igo, DO;  Location: [facility];  Service: Pulmonary;  Laterality: N/A;    reports that he quit smoking about 3 years ago. His smoking use included cigarettes. He started smoking about 33 years ago. He has a 45 pack-year smoking history. He has quit using smokeless tobacco.  His smokeless tobacco use included chew. He reports that he does not  drink alcohol and does not use drugs. family history includes Cancer in his cousin and mother; Hypertension in an other family member; Stroke in an other family member. No Known Allergies Current Outpatient Medications on File Prior to Visit  Medication Sig Dispense Refill   aspirin 325 MG EC tablet Take 1 tablet (325 mg total) by mouth daily. 90 tablet 99   b complex vitamins tablet Take 1 tablet by mouth daily.     cholecalciferol (VITAMIN D3) 25 MCG (1000 UNIT) tablet Take 1,000 Units by mouth daily.     folic  acid (FOLVITE) 1 MG tablet Take 1 tablet by mouth once daily 90 tablet 3   Garlic 1000 MG CAPS Take 1,000 mg by mouth daily.      lidocaine-prilocaine (EMLA) cream Apply to the Port-A-Cath site 30-60 minutes before chemotherapy. 30 g 0   Omega-3 Fatty Acids (FISH OIL PO) Take 1 capsule by mouth daily.      polyethylene glycol (MIRALAX / GLYCOLAX) 17 g packet Take 17 g by mouth daily as needed.     predniSONE (DELTASONE) 10 MG tablet 3 tabs by mouth per day for 3 days,2tabs per day for 3 days,1tab per day for 3 days 18 tablet 0   prochlorperazine (COMPAZINE) 10 MG tablet Take 1 tablet (10 mg total) by mouth every 6 (six) hours as needed for nausea or vomiting. 30 tablet 0   triamcinolone cream (KENALOG) 0.1 % Apply 1 Application topically 2 (two) times daily. 30 g 1   vardenafil (LEVITRA) 20 MG tablet TAKE 1 TABLET BY MOUTH AS NEEDED FOR  ERECTILE  DYSFUNCTION 10 tablet 5   No current facility-administered medications on file prior to visit.        ROS:  All others reviewed and negative.  Objective        PE:  BP 120/66 (BP Location: Right Arm, Patient Position: Sitting, Cuff Size: Normal)   Pulse 82   Temp 98.3 F (36.8 C) (Oral)   Ht 6' (1.829 m)   Wt 168 lb (76.2 kg)   SpO2 98%   BMI 22.78 kg/m                 Constitutional: Pt appears in NAD               HENT: Head: NCAT.                Right Ear: External ear normal.                 Left Ear: External ear normal.                Eyes: . Pupils are equal, round, and reactive to light. Conjunctivae and EOM are normal               Nose: without d/c or deformity               Neck: Neck supple. Gross normal ROM               Cardiovascular: Normal rate and regular rhythm.                 Pulmonary/Chest: Effort normal and breath sounds without rales or wheezing.                Abd:  Soft, NT, ND, + BS, no organomegaly               Neurological: Pt is alert. At baseline  orientation, motor grossly intact               Skin: Skin  is warm. No rashes, no other new lesions, LE edema - none               Psychiatric: Pt behavior is normal without agitation   Micro: none  Cardiac tracings I have personally interpreted today:  none  Pertinent Radiological findings (summarize): none   Lab Results  Component Value Date   WBC 4.9 07/22/2023   HGB 13.3 07/22/2023   HCT 40.4 07/22/2023   PLT 128 (L) 07/22/2023   GLUCOSE 87 07/22/2023   CHOL 178 02/20/2023   TRIG 84.0 02/20/2023   HDL 57.90 02/20/2023   LDLCALC 104 (H) 02/20/2023   ALT 12 07/22/2023   AST 21 07/22/2023   NA 135 07/22/2023   K 4.0 07/22/2023   CL 103 07/22/2023   CREATININE 1.62 (H) 07/22/2023   BUN 17 07/22/2023   CO2 25 07/22/2023   TSH 0.78 02/20/2023   PSA 0.34 02/20/2023   INR 1.0 03/30/2020   HGBA1C 6.3 02/20/2023   MICROALBUR 8.8 (H) 02/20/2023   Assessment/Plan:  Nicolas Allen is a 68 y.o. Black or African American [2] male with  has a past medical history of Anemia (06/24/2012), ANXIETY (02/03/2007), Cervical disc disease (02/12/2012), Erectile dysfunction (02/11/2011), GERD (gastroesophageal reflux disease), GLUCOSE INTOLERANCE (01/04/2008), HYPERLIPIDEMIA (02/03/2007), HYPERTENSION (02/03/2007), IBS (irritable bowel syndrome), Impaired glucose tolerance (02/11/2011), nscl ca (03/30/2020), Smoker (08/22/2014), and Substance abuse (HCC) (2001).  Encounter for well adult exam with abnormal findings Age and sex appropriate education and counseling updated with regular exercise and diet Referrals for preventative services - none needed Immunizations addressed - decliens flu shot Smoking counseling  - none needed Evidence for depression or other mood disorder - none significant Most recent labs reviewed. I have personally reviewed and have noted: 1) the patient's medical and social history 2) The patient's current medications and supplements 3) The patient's height, weight, and BMI have been recorded in the chart   B12  deficiency Lab Results  Component Value Date   VITAMINB12 496 02/20/2023   Stable, cont oral replacement - b12 1000 mcg qd   Essential hypertension BP Readings from Last 3 Encounters:  08/26/23 120/66  07/29/23 98/68  06/03/23 118/62   Stable, pt to continue medical treatment lotrel 10 20 qd   Hyperlipidemia Lab Results  Component Value Date   LDLCALC 104 (H) 02/20/2023   Uncontrolled, goal ldl < 70,, pt to continue current statin crestor 40 every day and lower chol diet, declines add zetia   Impaired glucose tolerance Lab Results  Component Value Date   HGBA1C 6.3 02/20/2023   Stable, pt to continue current medical treatment  - diet, wt control  Vitamin D deficiency Last vitamin D Lab Results  Component Value Date   VD25OH 63.96 02/20/2023   Stable, cont oral replacement   Microhematuria D/w pt - declines urology or CT renal  Followup: Return in about 6 months (around 02/26/2024).  Oliver Barre, MD 08/29/2023 7:49 PM [facility]

## 2023-08-26 NOTE — Patient Instructions (Signed)
 Please continue all other medications as before, and refills have been done if requested.  Please have the pharmacy call with any other refills you may need.  Please continue your efforts at being more active, low cholesterol diet, and weight control.  You are otherwise up to date with prevention measures today.  Please keep your appointments with your specialists as you may have planned  Please make an Appointment to return in 6 months, or sooner if needed

## 2023-08-29 ENCOUNTER — Encounter: Payer: Self-pay | Admitting: Internal Medicine

## 2023-08-29 NOTE — Assessment & Plan Note (Signed)
Lab Results  Component Value Date   HGBA1C 6.3 02/20/2023   Stable, pt to continue current medical treatment  - diet,wt control

## 2023-08-29 NOTE — Assessment & Plan Note (Signed)
Age and sex appropriate education and counseling updated with regular exercise and diet Referrals for preventative services - none needed Immunizations addressed - decliens flu shot Smoking counseling  - none needed Evidence for depression or other mood disorder - none significant Most recent labs reviewed. I have personally reviewed and have noted: 1) the patient's medical and social history 2) The patient's current medications and supplements 3) The patient's height, weight, and BMI have been recorded in the chart

## 2023-08-29 NOTE — Assessment & Plan Note (Signed)
 Lab Results  Component Value Date   LDLCALC 104 (H) 02/20/2023   Uncontrolled, goal ldl < 70,, pt to continue current statin crestor 40 every day and lower chol diet, declines add zetia

## 2023-08-29 NOTE — Assessment & Plan Note (Signed)
Last vitamin D Lab Results  Component Value Date   VD25OH 63.96 02/20/2023   Stable, cont oral replacement

## 2023-08-29 NOTE — Assessment & Plan Note (Signed)
 D/w pt - declines urology or CT renal

## 2023-08-29 NOTE — Assessment & Plan Note (Signed)
Lab Results  Component Value Date   VITAMINB12 496 02/20/2023   Stable, cont oral replacement - b12 1000 mcg qd

## 2023-08-29 NOTE — Assessment & Plan Note (Signed)
 BP Readings from Last 3 Encounters:  08/26/23 120/66  07/29/23 98/68  06/03/23 118/62   Stable, pt to continue medical treatment lotrel 10 20 qd

## 2023-09-10 IMAGING — MR MR HEAD WO/W CM
12 series · 48 of 48 positions shown · IV contrast (15 ml multihance)
Comparison: 01/31/2021.

CLINICAL DATA: Restaging of lung cancer.  SRS.

EXAM:
MRI HEAD WITHOUT AND WITH CONTRAST
TECHNIQUE: Multiplanar, multiecho pulse sequences of the brain and surrounding
structures were obtained without and with intravenous contrast.
CONTRAST:  01/31/2021

[Series 2: FLAIR · sagittal · 3.0mm · 0.75mm/px · 2 of 42 slices shown (1 of 2)]
[im 1/42]
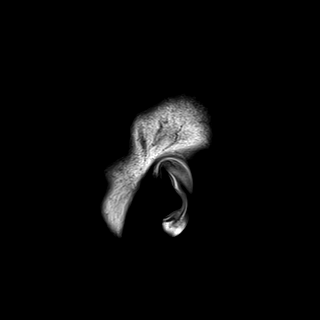
[im 42/42]
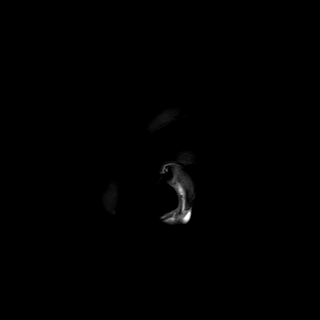

[Series 3: DWI · axial · 3.0mm · 1.50mm/px · z∈[-77,+91]mm · 5 of 88 slices shown (1 of 2)]
[im 1/88]
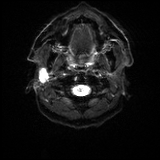
[im 22/88]
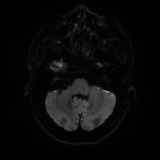
[im 44/88]
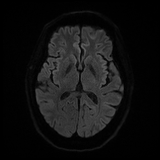
[im 66/88]
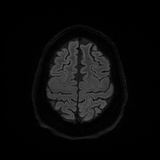
[im 88/88]
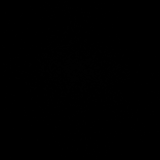

[Series 4: DWI · axial · 3.0mm · 1.50mm/px · z∈[-77,+87]mm · 2 of 41 slices shown (2 of 2)]
[im 1/41]
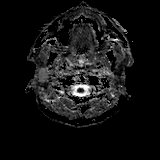
[im 41/41]
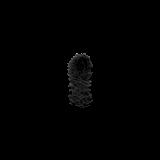

[Series 5: T2 · axial · 5.0mm · 0.57mm/px · z∈[-77,+97]mm · 2 of 30 slices shown]
[im 1/30]
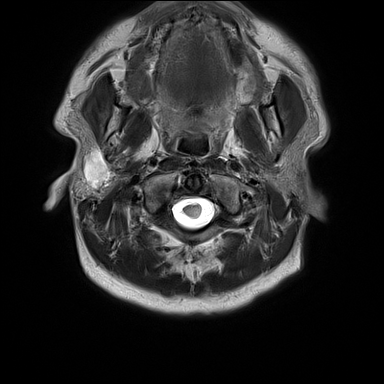
[im 30/30]
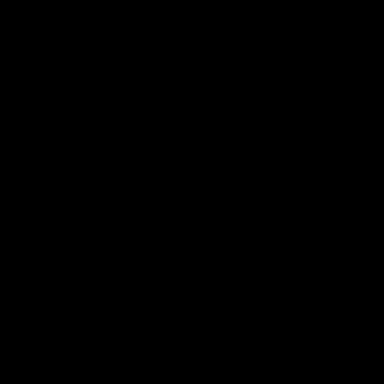

[Series 6: swi_images · axial · 1.5mm · 0.90mm/px · z∈[-74,+93]mm · 6 of 112 slices shown]
[im 1/112]
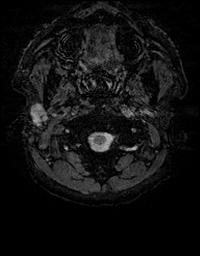
[im 23/112]
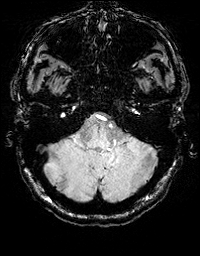
[im 45/112]
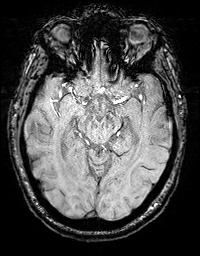
[im 67/112]
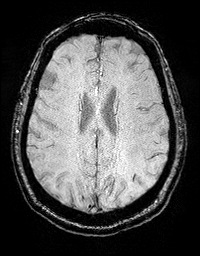
[im 89/112]
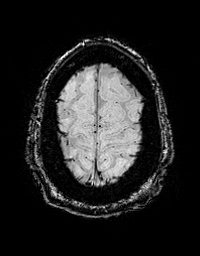
[im 112/112]
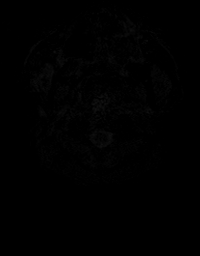

[Series 8: FLAIR · axial · 3.0mm · 0.86mm/px · z∈[-96,+96]mm · 3 of 64 slices shown (2 of 2)]
[im 1/64]
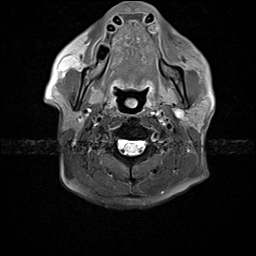
[im 32/64]
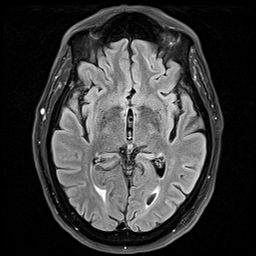
[im 64/64]
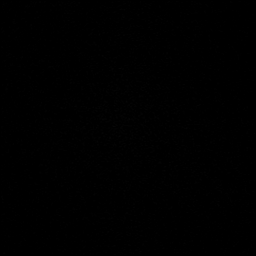

[Series 10: T2 post-contrast · coronal · 3.0mm · 0.57mm/px · 3 of 57 slices shown (1 of 2)]
[im 1/57]
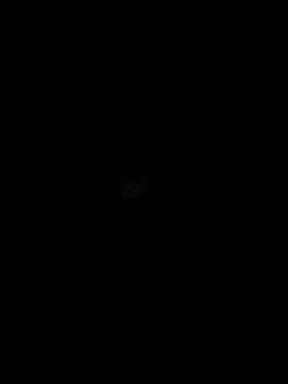
[im 29/57]
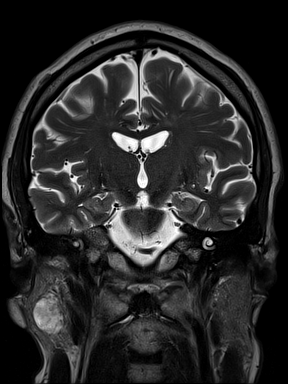
[im 57/57]
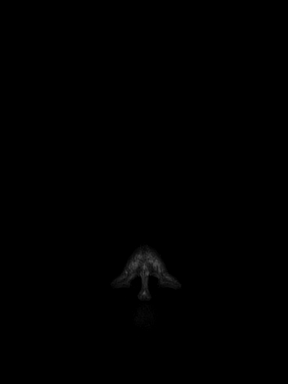

[Series 11: T2 post-contrast · axial · 1.0mm · 0.86mm/px · z∈[-77,+94]mm · 9 of 176 slices shown (2 of 2)]
[im 1/176]
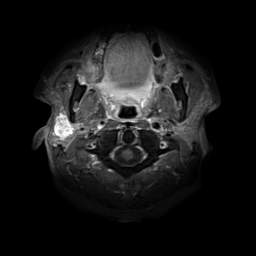
[im 22/176]
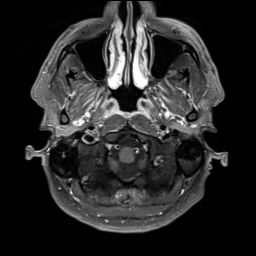
[im 44/176]
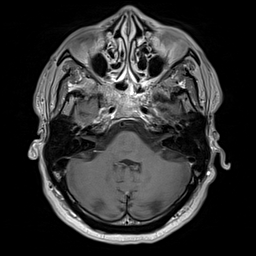
[im 66/176]
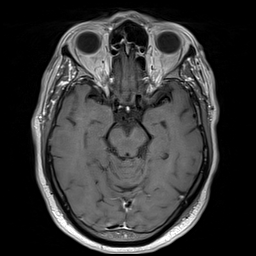
[im 88/176]
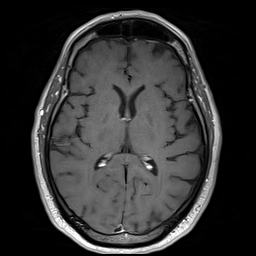
[im 110/176]
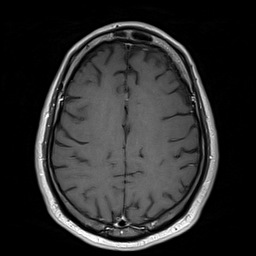
[im 132/176]
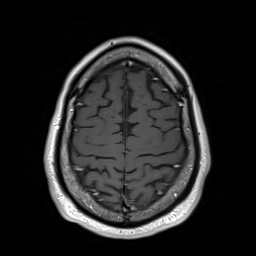
[im 154/176]
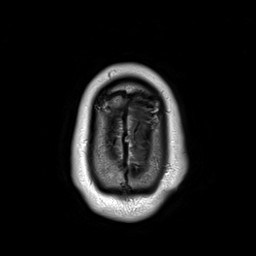
[im 176/176]
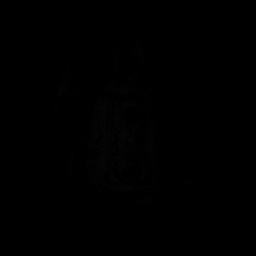

[Series 12: T1 post-contrast · axial · 1.0mm · 0.75mm/px · z∈[-79,+95]mm · 9 of 175 slices shown (1 of 2)]
[im 1/175]
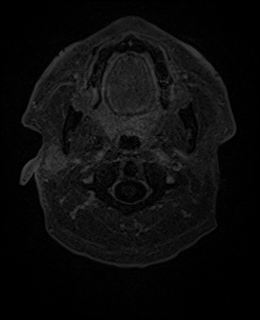
[im 22/175]
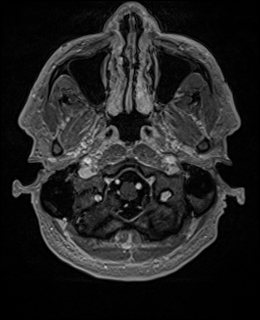
[im 44/175]
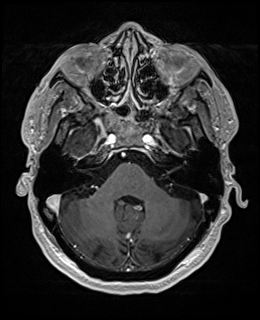
[im 66/175]
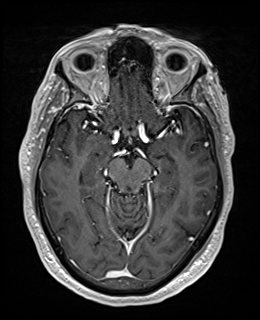
[im 88/175]
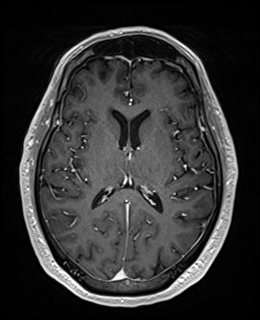
[im 109/175]
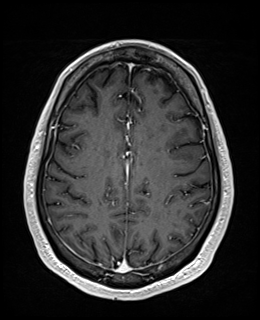
[im 131/175]
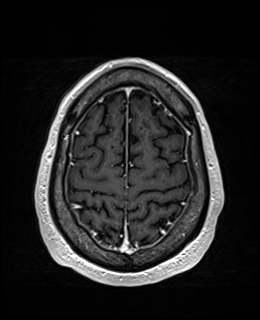
[im 153/175]
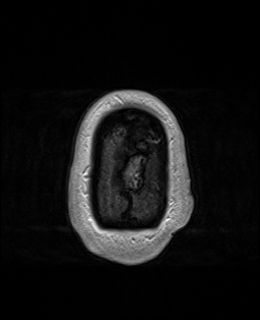
[im 175/175]
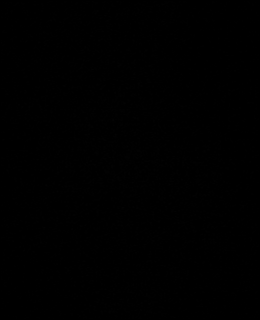

[Series 13: T1 post-contrast · coronal · 3.0mm · 0.57mm/px · 3 of 57 slices shown (2 of 2)]
[im 1/57]
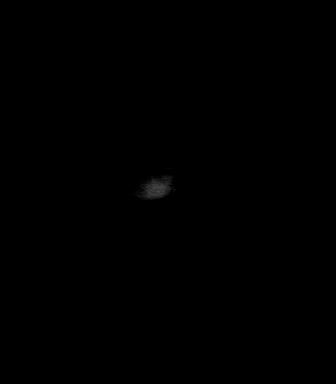
[im 29/57]
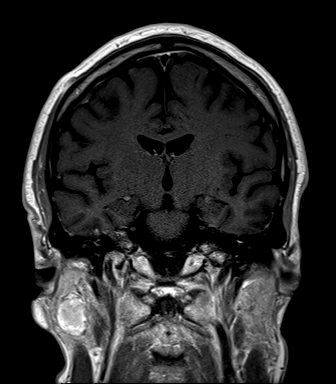
[im 57/57]
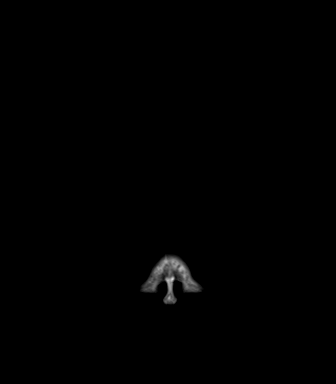

[Series 14: FLAIR post-contrast · sagittal · 3.0mm · 0.75mm/px · 2 of 46 slices shown (1 of 2)]
[im 1/46]
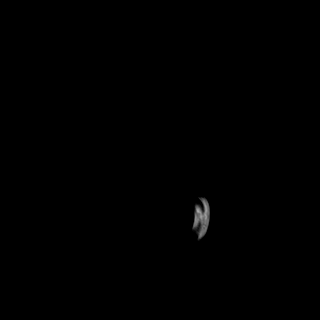
[im 46/46]
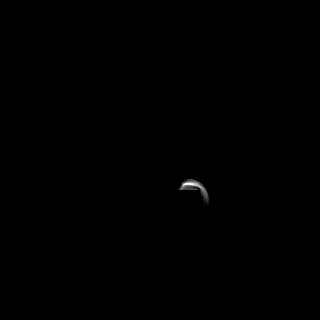

[Series 15: FLAIR post-contrast · sagittal · 3.0mm · 0.75mm/px · 2 of 46 slices shown (2 of 2)]
[im 1/46]
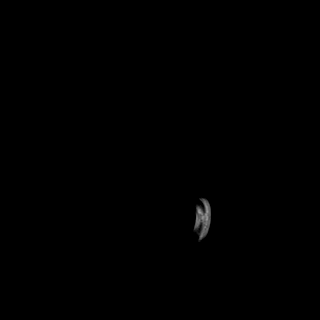
[im 46/46]
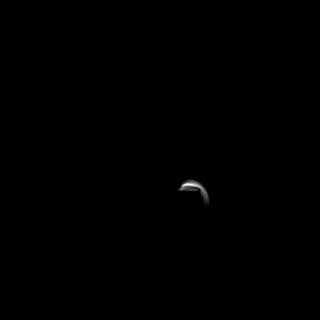

[48 of 48 positions shown; findings below may reference images not displayed]

FINDINGS: Brain: No acute infarct, mass effect or extra-axial collection. No
acute or chronic hemorrhage. There is multifocal hyperintense
T2-weighted signal within the white matter. Generalized volume loss
without a clear lobar predilection. The midline structures are
normal. Unchanged appearance of 5 mm contrast enhancing lesion of
the cerebellar vermis (series 11, image 47). No new lesions.

Vascular: Major flow voids are preserved.

Skull and upper cervical spine: Hyperintense T2-weighted signal
right parotid mass measures 2.2 cm, unchanged. Normal calvarium and
visualized upper cervical spine

Sinuses/Orbits:No paranasal sinus fluid levels or advanced mucosal
thickening. No mastoid or middle ear effusion. Normal orbits.
IMPRESSION: Unchanged appearance of 5 mm contrast enhancing lesion of the
cerebellar vermis. No new lesions.

## 2023-09-28 ENCOUNTER — Inpatient Hospital Stay: Payer: Medicare Other | Attending: Internal Medicine

## 2023-09-28 DIAGNOSIS — C7931 Secondary malignant neoplasm of brain: Secondary | ICD-10-CM | POA: Diagnosis not present

## 2023-09-28 DIAGNOSIS — C3431 Malignant neoplasm of lower lobe, right bronchus or lung: Secondary | ICD-10-CM | POA: Insufficient documentation

## 2023-09-28 DIAGNOSIS — Z452 Encounter for adjustment and management of vascular access device: Secondary | ICD-10-CM | POA: Insufficient documentation

## 2023-09-28 DIAGNOSIS — Z87891 Personal history of nicotine dependence: Secondary | ICD-10-CM | POA: Insufficient documentation

## 2023-09-28 DIAGNOSIS — C3411 Malignant neoplasm of upper lobe, right bronchus or lung: Secondary | ICD-10-CM | POA: Diagnosis not present

## 2023-09-28 DIAGNOSIS — Z95828 Presence of other vascular implants and grafts: Secondary | ICD-10-CM

## 2023-09-28 MED ORDER — SODIUM CHLORIDE 0.9% FLUSH
10.0000 mL | Freq: Once | INTRAVENOUS | Status: AC
  Administered 2023-09-28: 10 mL

## 2023-09-28 MED ORDER — HEPARIN SOD (PORK) LOCK FLUSH 100 UNIT/ML IV SOLN
250.0000 [IU] | Freq: Once | INTRAVENOUS | Status: AC
  Administered 2023-09-28: 250 [IU]

## 2023-10-07 DIAGNOSIS — D110 Benign neoplasm of parotid gland: Secondary | ICD-10-CM | POA: Diagnosis not present

## 2023-10-19 ENCOUNTER — Ambulatory Visit (HOSPITAL_COMMUNITY)
Admission: RE | Admit: 2023-10-19 | Discharge: 2023-10-19 | Disposition: A | Discharge status: Home / Self Care | Source: Ambulatory Visit | Attending: Internal Medicine | Admitting: Internal Medicine

## 2023-10-19 ENCOUNTER — Encounter: Payer: Self-pay | Admitting: Internal Medicine

## 2023-10-19 ENCOUNTER — Inpatient Hospital Stay: Payer: Medicare Other | Attending: Internal Medicine

## 2023-10-19 DIAGNOSIS — Z79899 Other long term (current) drug therapy: Secondary | ICD-10-CM | POA: Diagnosis not present

## 2023-10-19 DIAGNOSIS — D649 Anemia, unspecified: Secondary | ICD-10-CM | POA: Diagnosis not present

## 2023-10-19 DIAGNOSIS — R609 Edema, unspecified: Secondary | ICD-10-CM | POA: Diagnosis not present

## 2023-10-19 DIAGNOSIS — C3490 Malignant neoplasm of unspecified part of unspecified bronchus or lung: Secondary | ICD-10-CM | POA: Insufficient documentation

## 2023-10-19 DIAGNOSIS — N289 Disorder of kidney and ureter, unspecified: Secondary | ICD-10-CM | POA: Insufficient documentation

## 2023-10-19 DIAGNOSIS — C7931 Secondary malignant neoplasm of brain: Secondary | ICD-10-CM | POA: Insufficient documentation

## 2023-10-19 DIAGNOSIS — Z87891 Personal history of nicotine dependence: Secondary | ICD-10-CM | POA: Diagnosis not present

## 2023-10-19 DIAGNOSIS — R599 Enlarged lymph nodes, unspecified: Secondary | ICD-10-CM | POA: Insufficient documentation

## 2023-10-19 DIAGNOSIS — Z95828 Presence of other vascular implants and grafts: Secondary | ICD-10-CM

## 2023-10-19 DIAGNOSIS — C3411 Malignant neoplasm of upper lobe, right bronchus or lung: Secondary | ICD-10-CM | POA: Diagnosis not present

## 2023-10-19 LAB — CBC WITH DIFFERENTIAL (CANCER CENTER ONLY)
Abs Immature Granulocytes: 0 10*3/uL (ref 0.00–0.07)
Basophils Absolute: 0 10*3/uL (ref 0.0–0.1)
Basophils Relative: 1 %
Eosinophils Absolute: 0.1 10*3/uL (ref 0.0–0.5)
Eosinophils Relative: 3 %
HCT: 35.7 % — ABNORMAL LOW (ref 39.0–52.0)
Hemoglobin: 11.9 g/dL — ABNORMAL LOW (ref 13.0–17.0)
Immature Granulocytes: 0 %
Lymphocytes Relative: 37 %
Lymphs Abs: 1.8 10*3/uL (ref 0.7–4.0)
MCH: 25.8 pg — ABNORMAL LOW (ref 26.0–34.0)
MCHC: 33.3 g/dL (ref 30.0–36.0)
MCV: 77.3 fL — ABNORMAL LOW (ref 80.0–100.0)
Monocytes Absolute: 0.6 10*3/uL (ref 0.1–1.0)
Monocytes Relative: 12 %
Neutro Abs: 2.3 10*3/uL (ref 1.7–7.7)
Neutrophils Relative %: 47 %
Platelet Count: 146 10*3/uL — ABNORMAL LOW (ref 150–400)
RBC: 4.62 MIL/uL (ref 4.22–5.81)
RDW: 15.9 % — ABNORMAL HIGH (ref 11.5–15.5)
WBC Count: 4.8 10*3/uL (ref 4.0–10.5)
nRBC: 0 % (ref 0.0–0.2)

## 2023-10-19 LAB — CMP (CANCER CENTER ONLY)
ALT: 10 U/L (ref 0–44)
AST: 19 U/L (ref 15–41)
Albumin: 3.8 g/dL (ref 3.5–5.0)
Alkaline Phosphatase: 50 U/L (ref 38–126)
Anion gap: 5 (ref 5–15)
BUN: 19 mg/dL (ref 8–23)
CO2: 27 mmol/L (ref 22–32)
Calcium: 9.1 mg/dL (ref 8.9–10.3)
Chloride: 105 mmol/L (ref 98–111)
Creatinine: 1.81 mg/dL — ABNORMAL HIGH (ref 0.61–1.24)
GFR, Estimated: 41 mL/min — ABNORMAL LOW (ref >60)
Glucose, Bld: 87 mg/dL (ref 70–99)
Potassium: 4 mmol/L (ref 3.5–5.1)
Sodium: 137 mmol/L (ref 135–145)
Total Bilirubin: 0.4 mg/dL (ref 0.0–1.2)
Total Protein: 7.1 g/dL (ref 6.5–8.1)

## 2023-10-19 MED ORDER — HEPARIN SOD (PORK) LOCK FLUSH 100 UNIT/ML IV SOLN
500.0000 [IU] | Freq: Once | INTRAVENOUS | Status: AC
  Administered 2023-10-19: 500 [IU] via INTRAVENOUS

## 2023-10-19 MED ORDER — IOHEXOL 300 MG/ML  SOLN
80.0000 mL | Freq: Once | INTRAMUSCULAR | Status: AC | PRN
  Administered 2023-10-19: 80 mL via INTRAVENOUS

## 2023-10-19 MED ORDER — SODIUM CHLORIDE 0.9% FLUSH
10.0000 mL | Freq: Once | INTRAVENOUS | Status: AC
  Administered 2023-10-19: 10 mL

## 2023-10-27 ENCOUNTER — Inpatient Hospital Stay (HOSPITAL_BASED_OUTPATIENT_CLINIC_OR_DEPARTMENT_OTHER): Payer: Medicare Other | Admitting: Internal Medicine

## 2023-10-27 VITALS — BP 100/55 | HR 84 | Temp 97.9°F | Resp 17 | Ht 72.0 in | Wt 169.1 lb

## 2023-10-27 DIAGNOSIS — C3490 Malignant neoplasm of unspecified part of unspecified bronchus or lung: Secondary | ICD-10-CM

## 2023-10-27 DIAGNOSIS — D649 Anemia, unspecified: Secondary | ICD-10-CM | POA: Diagnosis not present

## 2023-10-27 DIAGNOSIS — Z79899 Other long term (current) drug therapy: Secondary | ICD-10-CM | POA: Diagnosis not present

## 2023-10-27 DIAGNOSIS — R599 Enlarged lymph nodes, unspecified: Secondary | ICD-10-CM | POA: Diagnosis not present

## 2023-10-27 DIAGNOSIS — C7931 Secondary malignant neoplasm of brain: Secondary | ICD-10-CM | POA: Diagnosis not present

## 2023-10-27 DIAGNOSIS — Z87891 Personal history of nicotine dependence: Secondary | ICD-10-CM | POA: Diagnosis not present

## 2023-10-27 DIAGNOSIS — C3411 Malignant neoplasm of upper lobe, right bronchus or lung: Secondary | ICD-10-CM | POA: Diagnosis not present

## 2023-10-27 DIAGNOSIS — R609 Edema, unspecified: Secondary | ICD-10-CM | POA: Diagnosis not present

## 2023-10-27 DIAGNOSIS — N289 Disorder of kidney and ureter, unspecified: Secondary | ICD-10-CM | POA: Diagnosis not present

## 2023-10-27 NOTE — Progress Notes (Signed)
 [facility] [phone]   [phone]  OFFICE PROGRESS NOTE  Roslyn Coombe, MD [address] [place] [address]  DIAGNOSIS: Stage IV (T3, N2, M1c) non-small cell lung cancer favoring adenocarcinoma presented with right upper lobe lung mass in addition to right lower lobe, left lower lobe in addition to right hilar and mediastinal lymphadenopathy in addition to metastatic brain lesions diagnosed in October 2021.  Molecular studies by [employer]:  [id number], 9.5%,   PRIOR THERAPY:  1) SRS to 3 brain lesion under the care of Dr. Jeryl Moris. 2) Systemic chemotherapy with carboplatin  for AUC of 5, Alimta  500 mg/M2 and Keytruda  200 mg IV every 3 weeks.  First dose April 24, 2020.  Status post 43 cycles.  Starting from cycle #5 the patient will be on maintenance treatment with Alimta  and Keytruda  every 3 weeks.  Last dose  was given on 09/23/2022 discontinued secondary to renal insufficiency and worsening edema  CURRENT THERAPY: Observation.  INTERVAL HISTORY: Nicolas Allen 68 y.o. male returns to the clinic today for follow-up visit. Discussed the use of AI scribe software for clinical note transcription with the patient, who gave verbal consent to proceed.  History of Present Illness   Nicolas Allen is a 68 year old male with stage four non-small cell lung cancer who presents with a new lump in the neck. He is accompanied by his wife, Ave Leisure.  He has a history of stage four non-small cell lung cancer, favoring adenocarcinoma, diagnosed in October 2021. He underwent stereotactic radiosurgery for a brain lesion and subsequently started palliative systemic chemotherapy with carboplatin , Alimta , and Keytruda  for four cycles. Since then, he has been on maintenance therapy with Alimta  and Keytruda , and later on single-agent Keytruda , completing a total of 43 cycles. His last dose was administered on September 23, 2022, and he has been under observation  since then.  Approximately one week after his last visit, he noticed a new 'knot' on the left side of his neck, described as having 'little holes and stuff on him.' No associated symptoms such as chest pain, breathing issues, nausea, vomiting, or diarrhea. During the review of symptoms, he denies experiencing headaches, changes in vision, or double vision.        MEDICAL HISTORY: Past Medical History:  Diagnosis Date   Anemia 06/24/2012   ANXIETY 02/03/2007   Cervical disc disease 02/12/2012   S/p surgury jan 2013   Erectile dysfunction 02/11/2011   GERD (gastroesophageal reflux disease)    GLUCOSE INTOLERANCE 01/04/2008   HYPERLIPIDEMIA 02/03/2007   HYPERTENSION 02/03/2007   IBS (irritable bowel syndrome)    Impaired glucose tolerance 02/11/2011   nscl ca 03/30/2020   Smoker 08/22/2014   Substance abuse (HCC) 2001   sober for 16 yrs    ALLERGIES:  has no known allergies.  MEDICATIONS:  Current Outpatient Medications  Medication Sig Dispense Refill   amLODipine -benazepril  (LOTREL) 10-20 MG capsule TAKE 1 CAPSULE BY MOUTH ONCE DAILY 90 capsule 3   aspirin  325 MG EC tablet Take 1 tablet (325 mg total) by mouth daily. 90 tablet 99   b complex vitamins tablet Take 1 tablet by mouth daily.     cholecalciferol (VITAMIN D3) 25 MCG (1000 UNIT) tablet Take 1,000 Units by mouth daily.     folic acid  (FOLVITE ) 1 MG tablet Take 1 tablet by mouth once daily 90 tablet 3   Garlic 1000 MG CAPS Take 1,000 mg by mouth daily.  lidocaine -prilocaine  (EMLA ) cream Apply to the Port-A-Cath site 30-60 minutes before chemotherapy. 30 g 0   Omega-3 Fatty Acids (FISH OIL PO) Take 1 capsule by mouth daily.      polyethylene glycol (MIRALAX / GLYCOLAX) 17 g packet Take 17 g by mouth daily as needed.     predniSONE  (DELTASONE ) 10 MG tablet 3 tabs by mouth per day for 3 days,2tabs per day for 3 days,1tab per day for 3 days 18 tablet 0   prochlorperazine  (COMPAZINE ) 10 MG tablet Take 1 tablet (10 mg  total) by mouth every 6 (six) hours as needed for nausea or vomiting. 30 tablet 0   rosuvastatin  (CRESTOR ) 40 MG tablet Take 1 tablet (40 mg total) by mouth daily. 90 tablet 3   triamcinolone  cream (KENALOG ) 0.1 % Apply 1 Application topically 2 (two) times daily. 30 g 1   vardenafil  (LEVITRA ) 20 MG tablet TAKE 1 TABLET BY MOUTH AS NEEDED FOR  ERECTILE  DYSFUNCTION 10 tablet 5   No current facility-administered medications for this visit.    SURGICAL HISTORY:  Past Surgical History:  Procedure Laterality Date   BRONCHIAL BIOPSY  03/30/2020   Procedure: BRONCHIAL BIOPSIES;  Surgeon: Prudy Brownie, DO;  Location: [facility];  Service: Pulmonary;;   BRONCHIAL BRUSHINGS  03/30/2020   Procedure: BRONCHIAL BRUSHINGS;  Surgeon: Prudy Brownie, DO;  Location: [facility];  Service: Pulmonary;;   BRONCHIAL NEEDLE ASPIRATION BIOPSY  03/30/2020   Procedure: BRONCHIAL NEEDLE ASPIRATION BIOPSIES;  Surgeon: Prudy Brownie, DO;  Location: [facility];  Service: Pulmonary;;   BRONCHIAL WASHINGS  03/30/2020   Procedure: BRONCHIAL WASHINGS;  Surgeon: Prudy Brownie, DO;  Location: [facility];  Service: Pulmonary;;   COLONOSCOPY  2007   IR IMAGING GUIDED PORT INSERTION  04/23/2020   neck fusion  2012   C4   NO PAST SURGERIES     VIDEO BRONCHOSCOPY WITH ENDOBRONCHIAL NAVIGATION N/A 03/30/2020   Procedure: VIDEO BRONCHOSCOPY WITH ENDOBRONCHIAL NAVIGATION;  Surgeon: Prudy Brownie, DO;  Location: [facility];  Service: Pulmonary;  Laterality: N/A;   VIDEO BRONCHOSCOPY WITH ENDOBRONCHIAL ULTRASOUND N/A 03/30/2020   Procedure: VIDEO BRONCHOSCOPY WITH ENDOBRONCHIAL ULTRASOUND;  Surgeon: Prudy Brownie, DO;  Location: [facility];  Service: Pulmonary;  Laterality: N/A;    REVIEW OF SYSTEMS:  Constitutional: positive for fatigue Eyes: negative Ears, nose, mouth, throat, and face: negative Respiratory: negative Cardiovascular: negative Gastrointestinal:  negative Genitourinary:negative Integument/breast: negative Hematologic/lymphatic: negative Musculoskeletal:negative Neurological: negative Behavioral/Psych: negative Endocrine: negative Allergic/Immunologic: negative   PHYSICAL EXAMINATION: General appearance: alert, cooperative, fatigued, and no distress Head: Normocephalic, without obvious abnormality, atraumatic Neck: no JVD, supple, symmetrical, trachea midline, thyroid  not enlarged, symmetric, no tenderness/mass/nodules, and left supraclavicular palpable lymph nodes Lymph nodes: Supraclavicular adenopathy: Left Resp: clear to auscultation bilaterally Back: symmetric, no curvature. ROM normal. No CVA tenderness. Cardio: regular rate and rhythm, S1, S2 normal, no murmur, click, rub or gallop GI: soft, non-tender; bowel sounds normal; no masses,  no organomegaly Extremities: extremities normal, atraumatic, no cyanosis or edema Neurologic: Alert and oriented X 3, normal strength and tone. Normal symmetric reflexes. Normal coordination and gait  ECOG PERFORMANCE STATUS: 1 - Symptomatic but completely ambulatory  Blood pressure (!) 100/55, pulse 84, temperature 97.9 F (36.6 C), temperature source Temporal, resp. rate 17, height 6' (1.829 m), weight 169 lb 1.6 oz (76.7 kg), SpO2 100%.  LABORATORY DATA: Lab Results  Component Value Date   WBC 4.8 10/19/2023   HGB 11.9 (L) 10/19/2023   HCT 35.7 (L)  10/19/2023   MCV 77.3 (L) 10/19/2023   PLT 146 (L) 10/19/2023      Chemistry      Component Value Date/Time   NA 137 10/19/2023 1508   K 4.0 10/19/2023 1508   CL 105 10/19/2023 1508   CO2 27 10/19/2023 1508   BUN 19 10/19/2023 1508   CREATININE 1.81 (H) 10/19/2023 1508   CREATININE 0.88 02/21/2020 1115      Component Value Date/Time   CALCIUM  9.1 10/19/2023 1508   ALKPHOS 50 10/19/2023 1508   AST 19 10/19/2023 1508   ALT 10 10/19/2023 1508   BILITOT 0.4 10/19/2023 1508       RADIOGRAPHIC STUDIES: CT Chest W  Contrast Result Date: 10/19/2023 CT CHEST WITH CONTRAST: CT CHEST W CONTRAST 10/19/2023 3:19 PM CDT CLINICAL HISTORY: Male, 68 years old. Non-small cell lung cancer (NSCLC), staging. Scan for staging of NSCLC. COMPARISON: CT chest from 07/28/2023 PROCEDURE COMMENTS: CT of the chest was performed following the uneventful administration of IV contrast. Oral contrast was not administered prior to the examination. CT was performed using one or more dose reduction techniques including automated exposure control, adjustment of the mA and/or kV according to patient size, and/or use of iterative reconstruction technique. 3-D reconstruction volume rendered imaging was performed on a separate workstation if necessary. Unless otherwise stated, incidental findings identified in this report do not require routine follow-up. FINDINGS: Medical Lines and Devices: Right IJ Mediport the tip terminating in the cavoatrial junction. Thyroid : Normal. Heart and Vasculature: Standard three vessel configuration of the aortic arch. No aneurysm, dissection or high-grade focal stenosis is appreciated. Mild atherosclerotic calcifications overlying the aortic arch and descending thoracic aorta. The heart is of normal size. No significant coronary artery calcifications.Main pulmonary artery is normal in caliber. Although not tailored for evaluation of pulmonary embolus and, no large central or segmental filling defect is appreciated on exam. Trace pericardial effusion, stable from prior study. Lymph Nodes: No pathologically enlarged lymph nodes in the axillary, supraclavicular mediastinal or hilar regions. A few scattered subcentimeter right pretracheal and subcarinal lymph nodes, most likely reactive in etiology. Interval increase in size of a abnormal heterogeneously enhancing lymph node along the left supra clavicular station measuring 3.9 x 3.2 cm, previously measuring up to 2.1 x 2.9 cm. Mediastinum: Trachea and esophagus are midline in  position. Lung Parenchyma and Pleura: Mild centrilobular emphysematous changes involving bilateral lungs, with upper lobe predominance. Diffuse bronchial wall thickening is present, nonspecific and may represent chronic bronchitis. Stable spiculated nodule identified in the right apex measuring 7 mm (2:14). Additional spiculated nodule identified within the medial aspect of the right lower lobe medially measuring 2.1 cm, also stable. Stable patchy groundglass opacities identified in the right middle lobe and lower lobe, measuring up to 2.5 cm, also stable. Stable juxtapleural fissure on the right lower lobe anteriorly measuring 4 mm (2:42). Unchanged small right and trace left pleural effusion. Redemonstrated cystic changes identified in the left lower lobe with increased conspicuity of a solid nodular component along the posterior aspect. The cystic lesion is relatively stable in size measuring 3.8 x 3.3 cm (2:46). Patchy groundglass opacities are seen surrounding this area. Musculoskeletal: No suspicious lytic or blastic lesions. No acute fracture. Mild to moderate multilevel degenerative changes of the mid to lower thoracic spine. Chest wall: Normal. Upper abdomen: As refer to dedicated CT abdomen and pelvis 4 additional findings below the level of the diaphragm. IMPRESSION: 1. Overall no significant change from prior exam. The dominant cystic mass  with surrounding ground glass opacity identified in the left lower lobe demonstrates slightly increased soft tissue nodularity when compared to the prior examination. This is again suspicious for adenocarcinoma. 2. Worsening left supraclavicular lymph node currently measures 3.9 cm, previously measuring up to 2.9 cm. 3.  No new or slightly enlarged lymphadenopathy is identified on examination. 4. Stable appearance of multiple groundglass and spiculated nodules identified within the lungs bilaterally as described above. Stable right pleural effusion. 5.  Additional  various incidental findings as above. Electronically signed by: Edrie Gower MD 10/19/2023 05:22 PM EDT RP Workstation: [id number]   CT ABDOMEN PELVIS W CONTRAST Result Date: 10/19/2023 CT ABDOMEN PELVIS W CONTRAST 10/19/2023 3:19 PM CDT CLINICAL HISTORY: Male, 68 years old. Non-small cell lung cancer (NSCLC), staging. Scan for staging of NSCLC. COMPARISON: CT abdomen and pelvis from 07/22/2023 PROCEDURE COMMENTS: CT of the abdomen and pelvis was performed following uneventful administration of IV contrast. Oral contrast was not administered prior to the examination. CT was performed using one or more dose reduction techniques including automated exposure control, adjustment of the mA and/or kV according to patient size, and/or use of iterative reconstruction technique.Unless otherwise stated, incidental findings identified in this report do not require routine follow-up. 3-D reconstruction volume rendered imaging was performed on a separate workstation if necessary. Unless otherwise stated, incidental findings identified in this report do not require routine follow-up. FINDINGS: Lower thorax: Please refer to dedicated CT chest from the same day for additional findings above the diaphragm. Liver and biliary tree: Liver is normal in morphology. No focal liver lesions are identified. Hepatic, portal, and superior mesenteric veins are patent. No intra- or extrahepatic biliary dilatation. Gallbladder: Normal Spleen: Normal. Pancreas: Normal enhancement of the pancreatic parenchyma. No ductal dilatation. Small low-density lesion identified within the uncinate process, similar to the prior study measuring up to 8 mm, possibly a pancreatic cyst. Adrenal glands: Normal. Kidneys and ureters: Symmetric bilateral enhancement. No hydronephrosis. No radiopaque renal calculi. Irregularity involving bilateral kidneys, possibly representing fetal lobulation or sequela of chronic kidney disease. Gastrointestinal tract: No bowel  obstruction or wall thickening. Peritoneal cavity: No free fluid or free air. Bladder: Bladder is under distended and therefore incompletely evaluated. Pelvic Organs: Normal prostate Vasculature: Moderate to marked calcified and noncalcified atherosclerotic calcifications involving the abdominal aorta. No abdominal aortic aneurysm, dissection or high-grade focal stenosis. IVC is patent. Lymph nodes: No pathologically enlarged lymphadenopathy in the abdomen or pelvis. Abdominal wall: Normal. Musculoskeletal: No suspicious osseous lesions. IMPRESSION: 1. No significant change when compared to the prior examination. Specifically, no evidence of metastatic disease within the abdomen or pelvis. 2.  Additional various incidental findings as described above. Electronically signed by: Edrie Gower MD 10/19/2023 05:16 PM EDT RP Workstation: [id number]     ASSESSMENT AND PLAN: This is a very pleasant 67 years old African-American male recently diagnosed with stage IV (T3, N2, M1c)  non-small cell lung cancer, adenocarcinoma presented with right upper lobe lung mass in addition to right lower lobe, left lower lobe in addition to right hilar and mediastinal lymphadenopathy in addition to metastatic brain lesions diagnosed in October 2021. The patient has molecular studies by [employer] that showed no actionable mutations. He underwent SRS treatment to his brain lesion. The patient underwent systemic chemotherapy with carboplatin  for AUC of 5, Alimta  500 mg/M2 and Keytruda  200 mg IV every 3 weeks status post 43 cycles.  Starting from cycle #5 the patient is on maintenance treatment with Alimta  and Keytruda  every 3 weeks. He  discontinued maintenance Alimta  and Keytruda  at this point because of the renal insufficiency, worsening anemia and edema. He underwent SBRT to the enlarging right middle lobe lung nodule under the care of Dr. Jeryl Moris.  He will have a Port-A-Cath flush every 6 weeks. For the worsening renal  insufficiency, he was referred to nephrology for evaluation and management of his condition. The patient is currently on observation and he is feeling fine except for the new palpable left supraclavicular lymphadenopathy. He had repeat CT scan of the chest, abdomen and pelvis performed recently.  I personally and independently reviewed the scan images and discussed the result with the patient and his wife and there is enlargement of left supraclavicular lymph node.    Stage 4 non-small cell lung cancer Stage 4 non-small cell lung cancer, favoring adenocarcinoma, diagnosed in October 2021. Status post SRS to brain lesion and palliative systemic chemotherapy with carboplatin , pemetrexed , and pembrolizumab  for four cycles, followed by maintenance with pemetrexed  and pembrolizumab , and then single-agent pembrolizumab . Completed 43 cycles, with the last dose on September 23, 2022. Currently asymptomatic with no chest pain, dyspnea, nausea, vomiting, or diarrhea.  Enlarged left supraclavicular lymph node Newly enlarged left supraclavicular lymph node, increasing from 2.1 x 2.9 cm to 3.9 x 3.2 cm. Potential metastasis from lung cancer, as lung cancer commonly metastasizes to lymph nodes. No other concerning findings on the scan. PET scan recommended to assess metabolic activity and determine if radiation therapy is warranted. - Order PET scan to evaluate the activity of the left supraclavicular lymph node. - Consider radiation therapy if PET scan indicates high activity and concern for cancer. - Schedule follow-up in three weeks to discuss PET scan results and further management.   The patient was advised to call immediately if he has any concerning symptoms in the interval. The patient voices understanding of current disease status and treatment options and is in agreement with the current care plan.  All questions were answered. The patient knows to call the clinic with any problems, questions or concerns. We  can certainly see the patient much sooner if necessary. The total time spent in the appointment was 30 minutes.  Disclaimer: This note was dictated with voice recognition software. Similar sounding words can inadvertently be transcribed and may not be corrected upon review.

## 2023-11-05 ENCOUNTER — Encounter (HOSPITAL_COMMUNITY)
Admission: RE | Admit: 2023-11-05 | Discharge: 2023-11-05 | Disposition: A | Discharge status: Home / Self Care | Source: Ambulatory Visit | Attending: Internal Medicine | Admitting: Internal Medicine

## 2023-11-05 DIAGNOSIS — C3490 Malignant neoplasm of unspecified part of unspecified bronchus or lung: Secondary | ICD-10-CM | POA: Insufficient documentation

## 2023-11-05 LAB — GLUCOSE, CAPILLARY: Glucose-Capillary: 83 mg/dL (ref 70–99)

## 2023-11-05 MED ORDER — HEPARIN SOD (PORK) LOCK FLUSH 100 UNIT/ML IV SOLN
500.0000 [IU] | Freq: Once | INTRAVENOUS | Status: AC
Start: 2023-11-05 — End: 2023-11-05
  Administered 2023-11-05: 500 [IU] via INTRAVENOUS
  Filled 2023-11-05: qty 5

## 2023-11-05 MED ORDER — FLUDEOXYGLUCOSE F - 18 (FDG) INJECTION
8.4300 | Freq: Once | INTRAVENOUS | Status: AC
  Administered 2023-11-05: 8.43 via INTRAVENOUS

## 2023-11-18 ENCOUNTER — Inpatient Hospital Stay: Attending: Internal Medicine | Admitting: Internal Medicine

## 2023-11-18 ENCOUNTER — Inpatient Hospital Stay

## 2023-11-18 VITALS — BP 111/59 | HR 64 | Temp 97.9°F | Resp 18 | Ht 72.0 in | Wt 168.9 lb

## 2023-11-18 DIAGNOSIS — Z95828 Presence of other vascular implants and grafts: Secondary | ICD-10-CM

## 2023-11-18 DIAGNOSIS — N289 Disorder of kidney and ureter, unspecified: Secondary | ICD-10-CM | POA: Insufficient documentation

## 2023-11-18 DIAGNOSIS — R591 Generalized enlarged lymph nodes: Secondary | ICD-10-CM | POA: Insufficient documentation

## 2023-11-18 DIAGNOSIS — C3411 Malignant neoplasm of upper lobe, right bronchus or lung: Secondary | ICD-10-CM | POA: Diagnosis not present

## 2023-11-18 DIAGNOSIS — C7931 Secondary malignant neoplasm of brain: Secondary | ICD-10-CM | POA: Insufficient documentation

## 2023-11-18 DIAGNOSIS — Z79899 Other long term (current) drug therapy: Secondary | ICD-10-CM | POA: Diagnosis not present

## 2023-11-18 DIAGNOSIS — C3490 Malignant neoplasm of unspecified part of unspecified bronchus or lung: Secondary | ICD-10-CM

## 2023-11-18 DIAGNOSIS — Z87891 Personal history of nicotine dependence: Secondary | ICD-10-CM | POA: Diagnosis not present

## 2023-11-18 LAB — CMP (CANCER CENTER ONLY)
ALT: 10 U/L (ref 0–44)
AST: 19 U/L (ref 15–41)
Albumin: 3.6 g/dL (ref 3.5–5.0)
Alkaline Phosphatase: 51 U/L (ref 38–126)
Anion gap: 5 (ref 5–15)
BUN: 16 mg/dL (ref 8–23)
CO2: 26 mmol/L (ref 22–32)
Calcium: 8.6 mg/dL — ABNORMAL LOW (ref 8.9–10.3)
Chloride: 106 mmol/L (ref 98–111)
Creatinine: 1.5 mg/dL — ABNORMAL HIGH (ref 0.61–1.24)
GFR, Estimated: 51 mL/min — ABNORMAL LOW (ref >60)
Glucose, Bld: 83 mg/dL (ref 70–99)
Potassium: 3.7 mmol/L (ref 3.5–5.1)
Sodium: 137 mmol/L (ref 135–145)
Total Bilirubin: 0.4 mg/dL (ref 0.0–1.2)
Total Protein: 6.9 g/dL (ref 6.5–8.1)

## 2023-11-18 LAB — CBC WITH DIFFERENTIAL (CANCER CENTER ONLY)
Abs Immature Granulocytes: 0.01 10*3/uL (ref 0.00–0.07)
Basophils Absolute: 0 10*3/uL (ref 0.0–0.1)
Basophils Relative: 1 %
Eosinophils Absolute: 0.2 10*3/uL (ref 0.0–0.5)
Eosinophils Relative: 4 %
HCT: 34.1 % — ABNORMAL LOW (ref 39.0–52.0)
Hemoglobin: 11.5 g/dL — ABNORMAL LOW (ref 13.0–17.0)
Immature Granulocytes: 0 %
Lymphocytes Relative: 40 %
Lymphs Abs: 1.7 10*3/uL (ref 0.7–4.0)
MCH: 26 pg (ref 26.0–34.0)
MCHC: 33.7 g/dL (ref 30.0–36.0)
MCV: 77.1 fL — ABNORMAL LOW (ref 80.0–100.0)
Monocytes Absolute: 0.5 10*3/uL (ref 0.1–1.0)
Monocytes Relative: 12 %
Neutro Abs: 1.8 10*3/uL (ref 1.7–7.7)
Neutrophils Relative %: 43 %
Platelet Count: 114 10*3/uL — ABNORMAL LOW (ref 150–400)
RBC: 4.42 MIL/uL (ref 4.22–5.81)
RDW: 16 % — ABNORMAL HIGH (ref 11.5–15.5)
WBC Count: 4.2 10*3/uL (ref 4.0–10.5)
nRBC: 0 % (ref 0.0–0.2)

## 2023-11-18 MED ORDER — HEPARIN SOD (PORK) LOCK FLUSH 100 UNIT/ML IV SOLN
500.0000 [IU] | Freq: Once | INTRAVENOUS | Status: AC
  Administered 2023-11-18: 500 [IU]

## 2023-11-18 MED ORDER — SODIUM CHLORIDE 0.9% FLUSH
10.0000 mL | Freq: Once | INTRAVENOUS | Status: AC
  Administered 2023-11-18: 10 mL

## 2023-11-18 NOTE — Progress Notes (Unsigned)
 [name], MD  Nicolas Allen Approved for large FDG avid LEFT supraclavicular LN.  Hx lung cancer.  HKM       Previous Messages    ----- Message ----- From: Derrell Flight Sent: 11/18/2023  10:24 AM EDT To: Derrell Flight; Ir Procedure Requests Subject: US  CORE BIOPSY (LYMPH NODES)                  Procedure :US  CORE BIOPSY (LYMPH NODES)  Reason :Ultrasound-guided core biopsy of left supraclavicular lymph node Dx: Malignant neoplasm metastatic to brain (HCC) [C79.31 (ICD-10-CM)]  History :NM PET Image Initial (PI) Skull Base To Thigh (F-18 FDG),CT ABDOMEN PELVIS W CONTRAST ,CT Chest W Contrast ,MR Brain W Wo Contrast,CT Chest Wo Contrast ,CT ABDOMEN PELVIS WO CONTRAST  Provider: Marlene Simas, MD  Provider contact ; [phone]

## 2023-11-18 NOTE — Progress Notes (Signed)
 [facility] Telephone:([phone]) [phone]   [phone]  OFFICE PROGRESS NOTE  Roslyn Coombe, MD [address] [place] [address]  DIAGNOSIS: Stage IV (T3, N2, M1c) non-small cell lung cancer favoring adenocarcinoma presented with right upper lobe lung mass in addition to right lower lobe, left lower lobe in addition to right hilar and mediastinal lymphadenopathy in addition to metastatic brain lesions diagnosed in October 2021.  Molecular studies by [employer]:  [id number], 9.5%,   PRIOR THERAPY:  1) SRS to 3 brain lesion under the care of Dr. Jeryl Moris. 2) Systemic chemotherapy with carboplatin  for AUC of 5, Alimta  500 mg/M2 and Keytruda  200 mg IV every 3 weeks.  First dose April 24, 2020.  Status post 43 cycles.  Starting from cycle #5 the patient will be on maintenance treatment with Alimta  and Keytruda  every 3 weeks.  Last dose  was given on 09/23/2022 discontinued secondary to renal insufficiency and worsening edema  CURRENT THERAPY: Observation.  INTERVAL HISTORY: Nicolas Allen 68 y.o. male returns to the clinic today for follow-up visit accompanied by his wife. Discussed the use of AI scribe software for clinical note transcription with the patient, who gave verbal consent to proceed.  History of Present Illness   Nicolas Allen is a 68 year old male with stage four non-small cell lung cancer who presents for evaluation and discussion of recent PET scan results. He is accompanied by his wife.  He was diagnosed with stage four non-small cell lung cancer, favoring adenocarcinoma, in October 2021. Initial treatment included systemic chemotherapy with carboplatin , Alimta , and Keytruda  for four cycles, followed by maintenance therapy with Alimta  and Keytruda  for a total of 43 cycles. The last dose was administered on September 23, 2022, after which treatment was discontinued due to secondary adrenal insufficiency and worsening edema. He has been under  observation since then.  A recent CT scan of the chest, abdomen, and pelvis revealed worsening left supraclavicular lymphadenopathy. A subsequent PET scan showed high activity in this area. He notes that the lymph node on the left side of his neck is enlarging. No associated pain, chest pain, or breathing issues.  He is concerned about undergoing a biopsy due to his mother's painful experience with a similar procedure.         MEDICAL HISTORY: Past Medical History:  Diagnosis Date   Anemia 06/24/2012   ANXIETY 02/03/2007   Cervical disc disease 02/12/2012   S/p surgury jan 2013   Erectile dysfunction 02/11/2011   GERD (gastroesophageal reflux disease)    GLUCOSE INTOLERANCE 01/04/2008   HYPERLIPIDEMIA 02/03/2007   HYPERTENSION 02/03/2007   IBS (irritable bowel syndrome)    Impaired glucose tolerance 02/11/2011   nscl ca 03/30/2020   Smoker 08/22/2014   Substance abuse (HCC) 2001   sober for 16 yrs    ALLERGIES:  has no known allergies.  MEDICATIONS:  Current Outpatient Medications  Medication Sig Dispense Refill   amLODipine -benazepril  (LOTREL) 10-20 MG capsule TAKE 1 CAPSULE BY MOUTH ONCE DAILY 90 capsule 3   aspirin  325 MG EC tablet Take 1 tablet (325 mg total) by mouth daily. 90 tablet 99   b complex vitamins tablet Take 1 tablet by mouth daily.     cholecalciferol (VITAMIN D3) 25 MCG (1000 UNIT) tablet Take 1,000 Units by mouth daily.     folic acid  (FOLVITE ) 1 MG tablet Take 1 tablet by mouth once daily 90 tablet 3   Garlic 1000  MG CAPS Take 1,000 mg by mouth daily.      lidocaine -prilocaine  (EMLA ) cream Apply to the Port-A-Cath site 30-60 minutes before chemotherapy. 30 g 0   Omega-3 Fatty Acids (FISH OIL PO) Take 1 capsule by mouth daily.      polyethylene glycol (MIRALAX / GLYCOLAX) 17 g packet Take 17 g by mouth daily as needed.     predniSONE  (DELTASONE ) 10 MG tablet 3 tabs by mouth per day for 3 days,2tabs per day for 3 days,1tab per day for 3 days 18 tablet 0    prochlorperazine  (COMPAZINE ) 10 MG tablet Take 1 tablet (10 mg total) by mouth every 6 (six) hours as needed for nausea or vomiting. 30 tablet 0   rosuvastatin  (CRESTOR ) 40 MG tablet Take 1 tablet (40 mg total) by mouth daily. 90 tablet 3   triamcinolone  cream (KENALOG ) 0.1 % Apply 1 Application topically 2 (two) times daily. 30 g 1   vardenafil  (LEVITRA ) 20 MG tablet TAKE 1 TABLET BY MOUTH AS NEEDED FOR  ERECTILE  DYSFUNCTION 10 tablet 5   No current facility-administered medications for this visit.    SURGICAL HISTORY:  Past Surgical History:  Procedure Laterality Date   BRONCHIAL BIOPSY  03/30/2020   Procedure: BRONCHIAL BIOPSIES;  Surgeon: Prudy Brownie, DO;  Location: [facility];  Service: Pulmonary;;   BRONCHIAL BRUSHINGS  03/30/2020   Procedure: BRONCHIAL BRUSHINGS;  Surgeon: Prudy Brownie, DO;  Location: [facility];  Service: Pulmonary;;   BRONCHIAL NEEDLE ASPIRATION BIOPSY  03/30/2020   Procedure: BRONCHIAL NEEDLE ASPIRATION BIOPSIES;  Surgeon: Prudy Brownie, DO;  Location: [facility];  Service: Pulmonary;;   BRONCHIAL WASHINGS  03/30/2020   Procedure: BRONCHIAL WASHINGS;  Surgeon: Prudy Brownie, DO;  Location: [facility];  Service: Pulmonary;;   COLONOSCOPY  2007   IR IMAGING GUIDED PORT INSERTION  04/23/2020   neck fusion  2012   C4   NO PAST SURGERIES     VIDEO BRONCHOSCOPY WITH ENDOBRONCHIAL NAVIGATION N/A 03/30/2020   Procedure: VIDEO BRONCHOSCOPY WITH ENDOBRONCHIAL NAVIGATION;  Surgeon: Prudy Brownie, DO;  Location: [facility];  Service: Pulmonary;  Laterality: N/A;   VIDEO BRONCHOSCOPY WITH ENDOBRONCHIAL ULTRASOUND N/A 03/30/2020   Procedure: VIDEO BRONCHOSCOPY WITH ENDOBRONCHIAL ULTRASOUND;  Surgeon: Prudy Brownie, DO;  Location: [facility];  Service: Pulmonary;  Laterality: N/A;    REVIEW OF SYSTEMS:  Constitutional: positive for fatigue Eyes: negative Ears, nose, mouth, throat, and face: negative Respiratory:  negative Cardiovascular: negative Gastrointestinal: negative Genitourinary:negative Integument/breast: negative Hematologic/lymphatic: negative Musculoskeletal:negative Neurological: negative Behavioral/Psych: negative Endocrine: negative Allergic/Immunologic: negative   PHYSICAL EXAMINATION: General appearance: alert, cooperative, fatigued, and no distress Head: Normocephalic, without obvious abnormality, atraumatic Neck: no JVD, supple, symmetrical, trachea midline, thyroid  not enlarged, symmetric, no tenderness/mass/nodules, and left supraclavicular palpable lymph nodes Lymph nodes: Supraclavicular adenopathy: Left Resp: clear to auscultation bilaterally Back: symmetric, no curvature. ROM normal. No CVA tenderness. Cardio: regular rate and rhythm, S1, S2 normal, no murmur, click, rub or gallop GI: soft, non-tender; bowel sounds normal; no masses,  no organomegaly Extremities: extremities normal, atraumatic, no cyanosis or edema Neurologic: Alert and oriented X 3, normal strength and tone. Normal symmetric reflexes. Normal coordination and gait  ECOG PERFORMANCE STATUS: 1 - Symptomatic but completely ambulatory  Blood pressure (!) 111/59, pulse 64, temperature 97.9 F (36.6 C), temperature source Tympanic, resp. rate 18, height 6' (1.829 m), weight 168 lb 14.4 oz (76.6 kg), SpO2 100%.  LABORATORY DATA: Lab Results  Component Value Date   WBC  4.2 11/18/2023   HGB 11.5 (L) 11/18/2023   HCT 34.1 (L) 11/18/2023   MCV 77.1 (L) 11/18/2023   PLT 114 (L) 11/18/2023      Chemistry      Component Value Date/Time   NA 137 11/18/2023 0907   K 3.7 11/18/2023 0907   CL 106 11/18/2023 0907   CO2 26 11/18/2023 0907   BUN 16 11/18/2023 0907   CREATININE 1.50 (H) 11/18/2023 0907   CREATININE 0.88 02/21/2020 1115      Component Value Date/Time   CALCIUM  8.6 (L) 11/18/2023 0907   ALKPHOS 51 11/18/2023 0907   AST 19 11/18/2023 0907   ALT 10 11/18/2023 0907   BILITOT 0.4 11/18/2023  0907       RADIOGRAPHIC STUDIES: NM PET Image Initial (PI) Skull Base To Thigh (F-18 FDG) Result Date: 11/06/2023 CLINICAL DATA:  Non-small-cell lung cancer staging. EXAM: NUCLEAR MEDICINE PET SKULL BASE TO THIGH TECHNIQUE: 8.43 mCi F-18 FDG was injected intravenously. Full-ring PET imaging was performed from the skull base to thigh after the radiotracer. CT data was obtained and used for attenuation correction and anatomic localization. Fasting blood glucose: 83 mg/dl COMPARISON:  PET-CT scan 04/02/2020. CT scan chest abdomen pelvis 10/19/2023 and older. FINDINGS: Mediastinal blood pool activity: SUV max 2.4 Liver activity: SUV max 2.8 NECK: Developing several abnormal hypermetabolic supraclavicular left-sided lymph nodes are identified. The area overall has maximum SUV value of 18.3. This likely a cluster of several nodes. Dominant focus seen more inferior medial on image 44 today is measured at 4.3 by 3.7 cm, similar to the recent chest CT scan. There are no additional areas of abnormal nodal uptake in the neck. Near symmetric uptake of the intracranial compartment. Incidental CT findings: Streak artifact related to the patient's dental hardware. Soft tissue nodule along the right parotid gland is not hypermetabolic and unchanged from the remote PET-CT. The left parotid gland, the submandibular glands are unremarkable. Thyroid  gland is unremarkable. Streak artifact related to patient's spinal fixation hardware. Mild opacity along the inferior aspect of the right maxillary sinus. CHEST: Complex cavitary lesion in the left lower lobe is again identified. This has a air-fluid level today. Low level uptake maximum SUV value 3.6. There is persistent surrounding ground-glass and lung opacity in the left lower lobe compared to the recent CT scan. Please correlate for history. The patchy opacities identified in the right posterolateral lower lobe and right upper lobe that are nodular are also stable. The lateral  right lower lobe focus has mild uptake maximum SUV of 2.5. This area was seen on the previous examination of the PET from 2021 and had maximum SUV value at that time 2.5. The upper lobe focus has also minimal uptake and similar. These areas have demonstrated long-term stability However there is a new focus of opacity along the superomedial right lower lobe medially posterior to the right main bronchus. This area has maximum SUV of 5.8 and on CT image 72 measures 2.2 by 0.8 cm. The uptake is more along the medial aspect of the lesion. Of interest is been present since at least the PET-CT scan of 2021 but the uptake is increased. Previous maximum SUV left 3.3. No abnormal uptake above blood pool in the axillary regions, hilum or mediastinum. Incidental CT findings: Small right pleural effusion. No pneumothorax. Emphysematous lung changes. Right IJ chest port. Small pericardial effusion. Heart is nonenlarged. Normal caliber thoracic esophagus. ABDOMEN/PELVIS: Physiologic distribution radiotracer along the parenchymal organs, bowel and renal collecting systems. Incidental  CT findings: Grossly the liver, spleen, adrenal glands and pancreas are unremarkable. Mild right-sided renal atrophy. No abnormal calcifications seen within either kidney nor along the course of either ureter. Preserved contour to the urinary bladder. Stomach is collapsed. Moderate colonic stool. Normal retrocecal appendix. Gallbladder is present. Diffuse vascular calcifications. SKELETON: No specific abnormal uptake identified along the visualized osseous structures. Incidental CT findings: Scattered degenerative changes. Again fixation hardware along the lower cervical spine. IMPRESSION: Significant abnormal hypermetabolism along the enlarging left supraclavicular lymph nodes. No additional abnormal lymph nodes identified. This is worrisome for a neoplasm. Is uncertain this is a metastatic lesion or new area of disease. Recommend further evaluation  such as this tissue sampling. Bilateral areas of lung nodularity including the complex cystic and solid focus in left lower lobe as seen on previous examinations. The associated opacity along the left lower lobe lesion is similar to the recent chest CT. Please correlate with any infectious or inflammatory process with low-level uptake. There are air-fluid levels within the mixed cystic and solid focus. Nodular lesions that are somewhat ill-defined in the right lung are also identified and a present since 2021 although there is new significant uptake with in the medial aspect of lesion seen in the superior segment of the right lower lobe immediately posterior to the course of the right main bronchus. With the maximum SUV of 5.8, malignant process is possible but lesion is slightly some present for at least 4 years. Please correlate for atypical infectious or inflammatory process or a secondary malignancy developing within the lesion. Options would include sampling versus close follow up surveillance. Electronically Signed   By: Adrianna Horde M.D.   On: 11/06/2023 17:11   CT Chest W Contrast Result Date: 10/19/2023 CT CHEST WITH CONTRAST: CT CHEST W CONTRAST 10/19/2023 3:19 PM CDT CLINICAL HISTORY: Male, 68 years old. Non-small cell lung cancer (NSCLC), staging. Scan for staging of NSCLC. COMPARISON: CT chest from 07/28/2023 PROCEDURE COMMENTS: CT of the chest was performed following the uneventful administration of IV contrast. Oral contrast was not administered prior to the examination. CT was performed using one or more dose reduction techniques including automated exposure control, adjustment of the mA and/or kV according to patient size, and/or use of iterative reconstruction technique. 3-D reconstruction volume rendered imaging was performed on a separate workstation if necessary. Unless otherwise stated, incidental findings identified in this report do not require routine follow-up. FINDINGS: Medical Lines and  Devices: Right IJ Mediport the tip terminating in the cavoatrial junction. Thyroid : Normal. Heart and Vasculature: Standard three vessel configuration of the aortic arch. No aneurysm, dissection or high-grade focal stenosis is appreciated. Mild atherosclerotic calcifications overlying the aortic arch and descending thoracic aorta. The heart is of normal size. No significant coronary artery calcifications.Main pulmonary artery is normal in caliber. Although not tailored for evaluation of pulmonary embolus and, no large central or segmental filling defect is appreciated on exam. Trace pericardial effusion, stable from prior study. Lymph Nodes: No pathologically enlarged lymph nodes in the axillary, supraclavicular mediastinal or hilar regions. A few scattered subcentimeter right pretracheal and subcarinal lymph nodes, most likely reactive in etiology. Interval increase in size of a abnormal heterogeneously enhancing lymph node along the left supra clavicular station measuring 3.9 x 3.2 cm, previously measuring up to 2.1 x 2.9 cm. Mediastinum: Trachea and esophagus are midline in position. Lung Parenchyma and Pleura: Mild centrilobular emphysematous changes involving bilateral lungs, with upper lobe predominance. Diffuse bronchial wall thickening is present, nonspecific and may represent  chronic bronchitis. Stable spiculated nodule identified in the right apex measuring 7 mm (2:14). Additional spiculated nodule identified within the medial aspect of the right lower lobe medially measuring 2.1 cm, also stable. Stable patchy groundglass opacities identified in the right middle lobe and lower lobe, measuring up to 2.5 cm, also stable. Stable juxtapleural fissure on the right lower lobe anteriorly measuring 4 mm (2:42). Unchanged small right and trace left pleural effusion. Redemonstrated cystic changes identified in the left lower lobe with increased conspicuity of a solid nodular component along the posterior aspect. The  cystic lesion is relatively stable in size measuring 3.8 x 3.3 cm (2:46). Patchy groundglass opacities are seen surrounding this area. Musculoskeletal: No suspicious lytic or blastic lesions. No acute fracture. Mild to moderate multilevel degenerative changes of the mid to lower thoracic spine. Chest wall: Normal. Upper abdomen: As refer to dedicated CT abdomen and pelvis 4 additional findings below the level of the diaphragm. IMPRESSION: 1. Overall no significant change from prior exam. The dominant cystic mass with surrounding ground glass opacity identified in the left lower lobe demonstrates slightly increased soft tissue nodularity when compared to the prior examination. This is again suspicious for adenocarcinoma. 2. Worsening left supraclavicular lymph node currently measures 3.9 cm, previously measuring up to 2.9 cm. 3.  No new or slightly enlarged lymphadenopathy is identified on examination. 4. Stable appearance of multiple groundglass and spiculated nodules identified within the lungs bilaterally as described above. Stable right pleural effusion. 5.  Additional various incidental findings as above. Electronically signed by: Edrie Gower MD 10/19/2023 05:22 PM EDT RP Workstation: [id number]   CT ABDOMEN PELVIS W CONTRAST Result Date: 10/19/2023 CT ABDOMEN PELVIS W CONTRAST 10/19/2023 3:19 PM CDT CLINICAL HISTORY: Male, 68 years old. Non-small cell lung cancer (NSCLC), staging. Scan for staging of NSCLC. COMPARISON: CT abdomen and pelvis from 07/22/2023 PROCEDURE COMMENTS: CT of the abdomen and pelvis was performed following uneventful administration of IV contrast. Oral contrast was not administered prior to the examination. CT was performed using one or more dose reduction techniques including automated exposure control, adjustment of the mA and/or kV according to patient size, and/or use of iterative reconstruction technique.Unless otherwise stated, incidental findings identified in this report do not  require routine follow-up. 3-D reconstruction volume rendered imaging was performed on a separate workstation if necessary. Unless otherwise stated, incidental findings identified in this report do not require routine follow-up. FINDINGS: Lower thorax: Please refer to dedicated CT chest from the same day for additional findings above the diaphragm. Liver and biliary tree: Liver is normal in morphology. No focal liver lesions are identified. Hepatic, portal, and superior mesenteric veins are patent. No intra- or extrahepatic biliary dilatation. Gallbladder: Normal Spleen: Normal. Pancreas: Normal enhancement of the pancreatic parenchyma. No ductal dilatation. Small low-density lesion identified within the uncinate process, similar to the prior study measuring up to 8 mm, possibly a pancreatic cyst. Adrenal glands: Normal. Kidneys and ureters: Symmetric bilateral enhancement. No hydronephrosis. No radiopaque renal calculi. Irregularity involving bilateral kidneys, possibly representing fetal lobulation or sequela of chronic kidney disease. Gastrointestinal tract: No bowel obstruction or wall thickening. Peritoneal cavity: No free fluid or free air. Bladder: Bladder is under distended and therefore incompletely evaluated. Pelvic Organs: Normal prostate Vasculature: Moderate to marked calcified and noncalcified atherosclerotic calcifications involving the abdominal aorta. No abdominal aortic aneurysm, dissection or high-grade focal stenosis. IVC is patent. Lymph nodes: No pathologically enlarged lymphadenopathy in the abdomen or pelvis. Abdominal wall: Normal. Musculoskeletal: No suspicious osseous lesions.  IMPRESSION: 1. No significant change when compared to the prior examination. Specifically, no evidence of metastatic disease within the abdomen or pelvis. 2.  Additional various incidental findings as described above. Electronically signed by: Edrie Gower MD 10/19/2023 05:16 PM EDT RP Workstation: [id number]      ASSESSMENT AND PLAN: This is a very pleasant 68 years old African-American male recently diagnosed with stage IV (T3, N2, M1c)  non-small cell lung cancer, adenocarcinoma presented with right upper lobe lung mass in addition to right lower lobe, left lower lobe in addition to right hilar and mediastinal lymphadenopathy in addition to metastatic brain lesions diagnosed in October 2021. The patient has molecular studies by [employer] that showed no actionable mutations. He underwent SRS treatment to his brain lesion. The patient underwent systemic chemotherapy with carboplatin  for AUC of 5, Alimta  500 mg/M2 and Keytruda  200 mg IV every 3 weeks status post 43 cycles.  Starting from cycle #5 the patient is on maintenance treatment with Alimta  and Keytruda  every 3 weeks. He discontinued maintenance Alimta  and Keytruda  at this point because of the renal insufficiency, worsening anemia and edema. He had CT scan followed by a PET scan performed recently that showed significant enlargement of left supraclavicular lymphadenopathy suspicious for disease recurrence versus new primary. I had a lengthy discussion with the patient and his wife today about his current condition. Assessment and Plan    Stage 4 non-small cell lung cancer Stage 4 non-small cell lung cancer, initially diagnosed in October 2021, treated with carboplatin , pemetrexed , and pembrolizumab . Maintenance therapy with pemetrexed  and pembrolizumab  was discontinued in April 2024 due to adrenal insufficiency and worsening edema.  Worsening left supraclavicular lymphadenopathy Worsening left supraclavicular lymphadenopathy on recent CT scan. PET scan shows high activity, suspicious for cancer. Differential diagnosis includes recurrence of non-small cell lung cancer or a new malignancy. Biopsy is necessary to determine the exact nature of the lymphadenopathy. The biopsy involves a small needle to extract tissue from the lymph node, an  outpatient procedure. He expressed concern due to a family member's past painful experience with a biopsy, but it was clarified that this procedure is less invasive. - Order biopsy of left supraclavicular lymph node. - Refer to radiation oncology for potential radiation therapy. - Consider chemotherapy and immunotherapy if radiation is insufficient.   He underwent SBRT to the enlarging right middle lobe lung nodule under the care of Dr. Jeryl Moris.  He will have a Port-A-Cath flush every 6 weeks. For the worsening renal insufficiency, he was referred to nephrology for evaluation and management of his condition. The patient was advised to call immediately if he has any concerning symptoms in the interval. The patient voices understanding of current disease status and treatment options and is in agreement with the current care plan.  All questions were answered. The patient knows to call the clinic with any problems, questions or concerns. We can certainly see the patient much sooner if necessary. The total time spent in the appointment was 30 minutes.  Disclaimer: This note was dictated with voice recognition software. Similar sounding words can inadvertently be transcribed and may not be corrected upon review.

## 2023-11-19 ENCOUNTER — Encounter: Payer: Self-pay | Admitting: Radiation Oncology

## 2023-11-19 ENCOUNTER — Ambulatory Visit
Admission: RE | Admit: 2023-11-19 | Discharge: 2023-11-19 | Disposition: A | Discharge status: Home / Self Care | Source: Ambulatory Visit | Attending: Radiation Oncology | Admitting: Radiation Oncology

## 2023-11-19 VITALS — BP 97/55 | HR 75 | Temp 97.1°F | Resp 18 | Ht 72.0 in | Wt 169.4 lb

## 2023-11-19 DIAGNOSIS — Z79899 Other long term (current) drug therapy: Secondary | ICD-10-CM | POA: Diagnosis not present

## 2023-11-19 DIAGNOSIS — C7931 Secondary malignant neoplasm of brain: Secondary | ICD-10-CM

## 2023-11-19 DIAGNOSIS — C3481 Malignant neoplasm of overlapping sites of right bronchus and lung: Secondary | ICD-10-CM | POA: Diagnosis not present

## 2023-11-19 DIAGNOSIS — K219 Gastro-esophageal reflux disease without esophagitis: Secondary | ICD-10-CM | POA: Insufficient documentation

## 2023-11-19 DIAGNOSIS — E785 Hyperlipidemia, unspecified: Secondary | ICD-10-CM | POA: Diagnosis not present

## 2023-11-19 DIAGNOSIS — C3491 Malignant neoplasm of unspecified part of right bronchus or lung: Secondary | ICD-10-CM

## 2023-11-19 DIAGNOSIS — Z800 Family history of malignant neoplasm of digestive organs: Secondary | ICD-10-CM | POA: Insufficient documentation

## 2023-11-19 DIAGNOSIS — D110 Benign neoplasm of parotid gland: Secondary | ICD-10-CM | POA: Insufficient documentation

## 2023-11-19 DIAGNOSIS — C770 Secondary and unspecified malignant neoplasm of lymph nodes of head, face and neck: Secondary | ICD-10-CM | POA: Insufficient documentation

## 2023-11-19 DIAGNOSIS — Z7982 Long term (current) use of aspirin: Secondary | ICD-10-CM | POA: Diagnosis not present

## 2023-11-19 DIAGNOSIS — C3411 Malignant neoplasm of upper lobe, right bronchus or lung: Secondary | ICD-10-CM | POA: Insufficient documentation

## 2023-11-19 DIAGNOSIS — Z923 Personal history of irradiation: Secondary | ICD-10-CM | POA: Diagnosis not present

## 2023-11-19 DIAGNOSIS — N529 Male erectile dysfunction, unspecified: Secondary | ICD-10-CM | POA: Diagnosis not present

## 2023-11-19 DIAGNOSIS — F17210 Nicotine dependence, cigarettes, uncomplicated: Secondary | ICD-10-CM | POA: Insufficient documentation

## 2023-11-19 DIAGNOSIS — J90 Pleural effusion, not elsewhere classified: Secondary | ICD-10-CM | POA: Insufficient documentation

## 2023-11-19 DIAGNOSIS — I959 Hypotension, unspecified: Secondary | ICD-10-CM | POA: Insufficient documentation

## 2023-11-19 DIAGNOSIS — I10 Essential (primary) hypertension: Secondary | ICD-10-CM | POA: Insufficient documentation

## 2023-11-19 DIAGNOSIS — C3482 Malignant neoplasm of overlapping sites of left bronchus and lung: Secondary | ICD-10-CM | POA: Diagnosis not present

## 2023-11-19 DIAGNOSIS — Z9221 Personal history of antineoplastic chemotherapy: Secondary | ICD-10-CM | POA: Diagnosis not present

## 2023-11-19 DIAGNOSIS — I3139 Other pericardial effusion (noninflammatory): Secondary | ICD-10-CM | POA: Diagnosis not present

## 2023-11-19 DIAGNOSIS — Z87891 Personal history of nicotine dependence: Secondary | ICD-10-CM | POA: Insufficient documentation

## 2023-11-19 NOTE — Progress Notes (Addendum)
 Thoracic Location of Tumor / Histology: LT Supraclavicular   Patient presented months ago with symptoms of: None. Found on recent scans PET 11/05/23 / CT chest 10/19/23.   Past/Anticipated interventions by cardiothoracic surgery, if any: None  Past/Anticipated interventions by medical oncology, if any:  - Order biopsy of left supraclavicular lymph node. - Refer to radiation oncology for potential radiation therapy. - Consider chemotherapy and immunotherapy if radiation is insufficient.   Tobacco/Marijuana/Snuff/ETOH use: None  Signs/Symptoms Weight changes, if any: Steady Respiratory complaints, if any: None Hemoptysis, if any: None Pain issues, if any:  None  SAFETY ISSUES: Prior radiation? First Treatment Date: 2023-02-10 - Last Treatment Date: 2023-02-17   Plan Name: Lung_R_SBRT Site: Lung, RML Pacemaker/ICD? No Possible current pregnancy? No (male) Is the patient on methotrexate? No  Current Complaints / other details:  None  Vitals- BP (!) 97/55 (BP Location: Left Arm, Patient Position: Sitting)   Pulse 75   Temp (!) 97.1 F (36.2 C) (Temporal)   Resp 18   Ht 6' (1.829 m)   Wt 169 lb 6 oz (76.8 kg)   SpO2 100%   BMI 22.97 kg/m   This concludes the interaction.  Avery Bodo, LPN

## 2023-11-19 NOTE — Progress Notes (Signed)
 Radiation Oncology         [phone] ________________________________  Outpatient follow-up- Conducted via telephone at patient request.  I spoke with the patient to conduct this visit via telephone. The patient was notified in advance and was offered an in person or telemedicine meeting to allow for face to face communication but instead preferred to proceed with a telephone visit.    Name: Nicolas Allen        MRN: [id number]  Date of Service: 11/19/2023 DOB: 05-21-1956  OZ:HYQM, Alveda Aures, MD  Marlene Simas, MD     REFERRING PHYSICIAN: Marlene Simas, MD   DIAGNOSIS: The primary encounter diagnosis was Metastasis to brain [facility]). 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   HISTORY OF PRESENT ILLNESS: Noa M Allen is a 68 y.o. male with a history of stage IV non-small cell lung cancer, adenocarcinoma involving multifocal disease in the lung and 3 brain metastases. He was diagnosed in the fall of 2021 and found to have metastatic disease in the lung and brain for which he has received stereotactic radiosurgery [facility]) with Dr. Jeryl Moris.  While he did have pseudoprogression due to his immunotherapy, there was growth in the same mid cerebellar lesion and after discussion in brain oncology conference, it was felt he should pursue re-irradiation which was completed with SRS  on 02/12/22. He completed consolidative chemo/immunotherapy on 09/25/22 with Dr. Marguerita Shih.   A CT CAP for surveillance was performed on 01/16/23. This showed an increase in a nodule that was being followed in the RML. It is pleural based, and measures 6.8 mm, previously 6.5 mm in April 2024, previously 4.4 mm in February 2024.  He went on to receive stereotactic body radiotherapy (SBRT) to this site which he completed on 02/17/23.   He has been followed  with surveillance MRIs as well since his original treatment with SRS and then subsequent reirradiation in August 2023. He also continues in surveillance with Dr. Marguerita Shih He will have a repeat CT in about 2 months for follow up to continue to evaluate a cystic area in the LLL in addition to for restaging purposes.  Of note an MRI brain on 05/13/23 showed a 2 mm area of enhancement in the right cerebellar hemisphere, which was a part of his previous treatment after discussion in multidisciplinary brain oncology conference. His most recent MRI brain on 08/18/23 showed a 3 mm enhancing lesion in the lateral right cerebellar hemisphere, slightly increased since November 2024, but after fusion review by physics, this area was completely treated. In addition the vermian lesion measured 2 cm and again is unchanged.    The patient recently was seen by Dr. 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 He  discussed his symptoms with Dr. Marguerita Shih, and a CT of the chest abdomen and pelvis on 10/19/2023 showed concerns for progressive change in the left supraclavicular lymph node station measuring up to 3.9 cm, by PET scan on 11/05/2023, this area had significant hypermetabolic activity with an SUV of 18.3.  His case was discussed in multidisciplinary thoracic oncology conference and recommended that he undergo a biopsy to confirm that this was recurrent metastatic lung cancer.  He is waiting to  hear about this being scheduled in radiology and is seen today to consider palliative radiation to that region.   PREVIOUS RADIATION THERAPY:  02/10/23-02/17/23 SBRT Treatment The tumor in the RML was treated with a course of stereotactic body radiation treatment. The patient will complete his therapy next week and will receive a total of 54 Gy In 3 fractions at 18 G per  fraction.  02/12/2022 through 02/12/2022 SRS Treatment Site Technique Total Dose (Gy) Dose per Fx (Gy) Completed Fx Beam Energies  Brain:  PTV_2_Retreat_60mm Mid Cerebellum IMRT 20/20 20 1/1 6XFFF    04/20/20 SRS Treatment: Each site below was treated to 20 Gy in 1 fraction PTV1 Rt Cerebellum 4mm PTV2Mid cerebellum 11mm PTV3Lt Parietal 6mm    PAST MEDICAL HISTORY:  Past Medical History:  Diagnosis Date   Anemia 06/24/2012   ANXIETY 02/03/2007   Cervical disc disease 02/12/2012   S/p surgury jan 2013   Erectile dysfunction 02/11/2011   GERD (gastroesophageal reflux disease)    GLUCOSE INTOLERANCE 01/04/2008   HYPERLIPIDEMIA 02/03/2007   HYPERTENSION 02/03/2007   IBS (irritable bowel syndrome)    Impaired glucose tolerance 02/11/2011   nscl ca 03/30/2020   Smoker 08/22/2014   Substance abuse (HCC) 2001   sober for 16 yrs       PAST SURGICAL HISTORY: Past Surgical History:  Procedure Laterality Date   BRONCHIAL BIOPSY  03/30/2020   Procedure: BRONCHIAL BIOPSIES;  Surgeon: Prudy Brownie, DO;  Location: [facility];  Service: Pulmonary;;   BRONCHIAL BRUSHINGS  03/30/2020   Procedure: BRONCHIAL BRUSHINGS;  Surgeon: Prudy Brownie, DO;  Location: [facility];  Service: Pulmonary;;   BRONCHIAL NEEDLE ASPIRATION BIOPSY  03/30/2020   Procedure: BRONCHIAL NEEDLE ASPIRATION BIOPSIES;  Surgeon: Prudy Brownie, DO;  Location: [facility];  Service: Pulmonary;;   BRONCHIAL WASHINGS  03/30/2020   Procedure: BRONCHIAL WASHINGS;  Surgeon: Prudy Brownie, DO;  Location: [facility];  Service: Pulmonary;;   COLONOSCOPY  2007   IR IMAGING GUIDED PORT INSERTION  04/23/2020   neck fusion  2012   C4   NO PAST SURGERIES     VIDEO BRONCHOSCOPY WITH ENDOBRONCHIAL NAVIGATION N/A 03/30/2020   Procedure: VIDEO BRONCHOSCOPY WITH ENDOBRONCHIAL NAVIGATION;  Surgeon: Prudy Brownie, DO;  Location: [facility];  Service: Pulmonary;  Laterality: N/A;   VIDEO BRONCHOSCOPY WITH  ENDOBRONCHIAL ULTRASOUND N/A 03/30/2020   Procedure: VIDEO BRONCHOSCOPY WITH ENDOBRONCHIAL ULTRASOUND;  Surgeon: Prudy Brownie, DO;  Location: [facility];  Service: Pulmonary;  Laterality: N/A;     FAMILY HISTORY:  Family History  Problem Relation Age of Onset   Cancer Mother        esophagus   Cancer Cousin    Stroke Other    Hypertension Other    Colon cancer Neg Hx      SOCIAL HISTORY:  reports that he quit smoking about 3 years ago. His smoking use included cigarettes. He started smoking about 33 years ago. He has a 45 pack-year smoking history. He has quit using smokeless tobacco.  His smokeless tobacco use included chew. He reports that he does not drink alcohol and does not use drugs. The patient is married and resides in [place]. He enjoys fishing and throwing horseshoes and recently took up this hobby again. 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   ALLERGIES: Patient has no known allergies.   MEDICATIONS:  Current Outpatient Medications  Medication Sig Dispense Refill   amLODipine -benazepril  (LOTREL) 10-20 MG capsule TAKE  1 CAPSULE BY MOUTH ONCE DAILY 90 capsule 3   aspirin  325 MG EC tablet Take 1 tablet (325 mg total) by mouth daily. 90 tablet 99   b complex vitamins tablet Take 1 tablet by mouth daily.     cholecalciferol (VITAMIN D3) 25 MCG (1000 UNIT) tablet Take 1,000 Units by mouth daily.     folic acid  (FOLVITE ) 1 MG tablet Take 1 tablet by mouth once daily 90 tablet 3   Garlic 1000 MG CAPS Take 1,000 mg by mouth daily.      lidocaine -prilocaine  (EMLA ) cream Apply to the Port-A-Cath site 30-60 minutes before chemotherapy. 30 g 0   Omega-3 Fatty Acids (FISH OIL PO) Take 1 capsule by mouth daily.      polyethylene glycol (MIRALAX / GLYCOLAX) 17 g packet Take 17 g by mouth daily as needed.     predniSONE  (DELTASONE ) 10 MG tablet 3 tabs by mouth per day for 3 days,2tabs per day for 3 days,1tab per day for 3 days 18 tablet 0    prochlorperazine  (COMPAZINE ) 10 MG tablet Take 1 tablet (10 mg total) by mouth every 6 (six) hours as needed for nausea or vomiting. 30 tablet 0   rosuvastatin  (CRESTOR ) 40 MG tablet Take 1 tablet (40 mg total) by mouth daily. 90 tablet 3   triamcinolone  cream (KENALOG ) 0.1 % Apply 1 Application topically 2 (two) times daily. 30 g 1   vardenafil  (LEVITRA ) 20 MG tablet TAKE 1 TABLET BY MOUTH AS NEEDED FOR  ERECTILE  DYSFUNCTION 10 tablet 5   No current facility-administered medications for this encounter.     REVIEW OF SYSTEMS: On review of systems, the patient reports he is doing well despite his neck fullness. He denies any dysphagia or odynophagia and states he is eating well. He denies any edema of his LUE. No other complaints are verbalized.   PHYSICAL EXAM:  Wt Readings from Last 3 Encounters:  11/19/23 169 lb 6 oz (76.8 kg)  11/18/23 168 lb 14.4 oz (76.6 kg)  10/27/23 169 lb 1.6 oz (76.7 kg)   Temp Readings from Last 3 Encounters:  11/19/23 (!) 97.1 F (36.2 C) (Temporal)  11/18/23 97.9 F (36.6 C) (Tympanic)  10/27/23 97.9 F (36.6 C) (Temporal)   BP Readings from Last 3 Encounters:  11/19/23 (!) 97/55  11/18/23 (!) 111/59  10/27/23 (!) 100/55   Pulse Readings from Last 3 Encounters:  11/19/23 75  11/18/23 64  10/27/23 84   In general this is a well appearing African American male in no acute distress. He's alert and oriented x4 and appropriate throughout the examination. His neck is slightly full at the left base without discreet mass. Cardiopulmonary assessment is negative for acute distress and he exhibits normal effort.    BP was originally taken in the LUE, it was repeated and was 97/61. This was repeated on the right and was 101/61   ECOG = 1  0 - Asymptomatic (Fully active, able to carry on all predisease activities without restriction)  1 - Symptomatic but completely ambulatory (Restricted in physically strenuous activity but ambulatory and able to carry out  work of a light or sedentary nature. For example, light housework, office work)  2 - Symptomatic, <50% in bed during the day (Ambulatory and capable of all self care but unable to carry out any work activities. Up and about more than 50% of waking hours)  3 - Symptomatic, >50% in bed, but not bedbound (Capable of only limited self-care, confined to  bed or chair 50% or more of waking hours)  4 - Bedbound (Completely disabled. Cannot carry on any self-care. Totally confined to bed or chair)  5 - Death   Aurea Blossom MM, Creech RH, Tormey DC, et al. [phone]). "Toxicity and response criteria of the [facility]". Am. Hillard Lowes. Oncol. 5 (6): 649-55    LABORATORY DATA:  Lab Results  Component Value Date   WBC 4.2 11/18/2023   HGB 11.5 (L) 11/18/2023   HCT 34.1 (L) 11/18/2023   MCV 77.1 (L) 11/18/2023   PLT 114 (L) 11/18/2023   Lab Results  Component Value Date   NA 137 11/18/2023   K 3.7 11/18/2023   CL 106 11/18/2023   CO2 26 11/18/2023   Lab Results  Component Value Date   ALT 10 11/18/2023   AST 19 11/18/2023   ALKPHOS 51 11/18/2023   BILITOT 0.4 11/18/2023      RADIOGRAPHY: NM PET Image Initial (PI) Skull Base To Thigh (F-18 FDG) Result Date: 11/06/2023 CLINICAL DATA:  Non-small-cell lung cancer staging. EXAM: NUCLEAR MEDICINE PET SKULL BASE TO THIGH TECHNIQUE: 8.43 mCi F-18 FDG was injected intravenously. Full-ring PET imaging was performed from the skull base to thigh after the radiotracer. CT data was obtained and used for attenuation correction and anatomic localization. Fasting blood glucose: 83 mg/dl COMPARISON:  PET-CT scan 04/02/2020. CT scan chest abdomen pelvis 10/19/2023 and older. FINDINGS: Mediastinal blood pool activity: SUV max 2.4 Liver activity: SUV max 2.8 NECK: Developing several abnormal hypermetabolic supraclavicular left-sided lymph nodes are identified. The area overall has maximum SUV value of 18.3. This likely a cluster of several nodes.  Dominant focus seen more inferior medial on image 44 today is measured at 4.3 by 3.7 cm, similar to the recent chest CT scan. There are no additional areas of abnormal nodal uptake in the neck. Near symmetric uptake of the intracranial compartment. Incidental CT findings: Streak artifact related to the patient's dental hardware. Soft tissue nodule along the right parotid gland is not hypermetabolic and unchanged from the remote PET-CT. The left parotid gland, the submandibular glands are unremarkable. Thyroid  gland is unremarkable. Streak artifact related to patient's spinal fixation hardware. Mild opacity along the inferior aspect of the right maxillary sinus. CHEST: Complex cavitary lesion in the left lower lobe is again identified. This has a air-fluid level today. Low level uptake maximum SUV value 3.6. There is persistent surrounding ground-glass and lung opacity in the left lower lobe compared to the recent CT scan. Please correlate for history. The patchy opacities identified in the right posterolateral lower lobe and right upper lobe that are nodular are also stable. The lateral right lower lobe focus has mild uptake maximum SUV of 2.5. This area was seen on the previous examination of the PET from 2021 and had maximum SUV value at that time 2.5. The upper lobe focus has also minimal uptake and similar. These areas have demonstrated long-term stability However there is a new focus of opacity along the superomedial right lower lobe medially posterior to the right main bronchus. This area has maximum SUV of 5.8 and on CT image 72 measures 2.2 by 0.8 cm. The uptake is more along the medial aspect of the lesion. Of interest is been present since at least the PET-CT scan of 2021 but the uptake is increased. Previous maximum SUV left 3.3. No abnormal uptake above blood pool in the axillary regions, hilum or mediastinum. Incidental CT findings: Small right pleural effusion. No pneumothorax. Emphysematous  lung  changes. Right IJ chest port. Small pericardial effusion. Heart is nonenlarged. Normal caliber thoracic esophagus. ABDOMEN/PELVIS: Physiologic distribution radiotracer along the parenchymal organs, bowel and renal collecting systems. Incidental CT findings: Grossly the liver, spleen, adrenal glands and pancreas are unremarkable. Mild right-sided renal atrophy. No abnormal calcifications seen within either kidney nor along the course of either ureter. Preserved contour to the urinary bladder. Stomach is collapsed. Moderate colonic stool. Normal retrocecal appendix. Gallbladder is present. Diffuse vascular calcifications. SKELETON: No specific abnormal uptake identified along the visualized osseous structures. Incidental CT findings: Scattered degenerative changes. Again fixation hardware along the lower cervical spine. IMPRESSION: Significant abnormal hypermetabolism along the enlarging left supraclavicular lymph nodes. No additional abnormal lymph nodes identified. This is worrisome for a neoplasm. Is uncertain this is a metastatic lesion or new area of disease. Recommend further evaluation such as this tissue sampling. Bilateral areas of lung nodularity including the complex cystic and solid focus in left lower lobe as seen on previous examinations. The associated opacity along the left lower lobe lesion is similar to the recent chest CT. Please correlate with any infectious or inflammatory process with low-level uptake. There are air-fluid levels within the mixed cystic and solid focus. Nodular lesions that are somewhat ill-defined in the right lung are also identified and a present since 2021 although there is new significant uptake with in the medial aspect of lesion seen in the superior segment of the right lower lobe immediately posterior to the course of the right main bronchus. With the maximum SUV of 5.8, malignant process is possible but lesion is slightly some present for at least 4 years. Please  correlate for atypical infectious or inflammatory process or a secondary malignancy developing within the lesion. Options would include sampling versus close follow up surveillance. Electronically Signed   By: Adrianna Horde M.D.   On: 11/06/2023 17:11        IMPRESSION/PLAN: 1. Stage IV non-small cell lung cancer, adenocarcinoma involving multifocal disease in the lung and brain metastases.  The patient is clinically doing well and despite his recent treatments and new findings on exam and PET scan, clinically he is still doing quite well.  We discussed the rationale for palliative radiation to his left supraclavicular region.  Since he will be undergoing a biopsy, it would be preferred to simulate after his procedure.  We will follow-up once his biopsy can be scheduled to coordinate simulation and subsequently would recommend 10 fractions of radiation.  We discussed the risks, benefits, short and long-term effects of radiotherapy. Written consent is obtained and placed in the chart, a copy was provided to the patient. The patient will be contacted to coordinate treatment planning by our simulation department.  2. Right parotid pleomorphic adenoma. While his biopsy in 2023 was benign, he continues to see Dr. Westley Hammers on an annual basis due to risks of malignant transformation.   3. Hypotension.  The patient has been hypotensive, he has still been taking antihypertensive medication but has not taken it today.  With his blood pressure so low I advised that he forego taking this and to contact Dr. Autry Legions.  They will go get a blood pressure monitor so that he can check these measurements at home and report that also to Dr. Autry Legions.   In a visit lasting 45 minutes, greater than 50% of the time was spent face to face discussing the patient's condition, in preparation for the discussion, and coordinating the patient's care.  Shelvia Dick, PAC

## 2023-11-20 ENCOUNTER — Ambulatory Visit (HOSPITAL_COMMUNITY)
Admission: RE | Admit: 2023-11-20 | Discharge: 2023-11-20 | Disposition: A | Discharge status: Home / Self Care | Source: Ambulatory Visit | Attending: Internal Medicine | Admitting: Internal Medicine

## 2023-11-20 ENCOUNTER — Other Ambulatory Visit: Payer: Self-pay

## 2023-11-20 VITALS — BP 105/64 | HR 69 | Temp 98.0°F | Resp 12 | Ht 72.0 in | Wt 169.0 lb

## 2023-11-20 DIAGNOSIS — C770 Secondary and unspecified malignant neoplasm of lymph nodes of head, face and neck: Secondary | ICD-10-CM | POA: Diagnosis not present

## 2023-11-20 DIAGNOSIS — I10 Essential (primary) hypertension: Secondary | ICD-10-CM | POA: Insufficient documentation

## 2023-11-20 DIAGNOSIS — Z85118 Personal history of other malignant neoplasm of bronchus and lung: Secondary | ICD-10-CM | POA: Insufficient documentation

## 2023-11-20 DIAGNOSIS — E785 Hyperlipidemia, unspecified: Secondary | ICD-10-CM | POA: Insufficient documentation

## 2023-11-20 DIAGNOSIS — C3482 Malignant neoplasm of overlapping sites of left bronchus and lung: Secondary | ICD-10-CM | POA: Diagnosis not present

## 2023-11-20 DIAGNOSIS — C7931 Secondary malignant neoplasm of brain: Secondary | ICD-10-CM | POA: Insufficient documentation

## 2023-11-20 DIAGNOSIS — C3481 Malignant neoplasm of overlapping sites of right bronchus and lung: Secondary | ICD-10-CM | POA: Diagnosis not present

## 2023-11-20 DIAGNOSIS — Z87891 Personal history of nicotine dependence: Secondary | ICD-10-CM | POA: Diagnosis not present

## 2023-11-20 DIAGNOSIS — Z01818 Encounter for other preprocedural examination: Secondary | ICD-10-CM | POA: Diagnosis not present

## 2023-11-20 DIAGNOSIS — C3490 Malignant neoplasm of unspecified part of unspecified bronchus or lung: Secondary | ICD-10-CM | POA: Diagnosis not present

## 2023-11-20 DIAGNOSIS — R599 Enlarged lymph nodes, unspecified: Secondary | ICD-10-CM | POA: Diagnosis not present

## 2023-11-20 MED ORDER — FENTANYL CITRATE (PF) 100 MCG/2ML IJ SOLN
INTRAMUSCULAR | Status: AC
  Filled 2023-11-20: qty 2

## 2023-11-20 MED ORDER — HEPARIN SOD (PORK) LOCK FLUSH 100 UNIT/ML IV SOLN
500.0000 [IU] | INTRAVENOUS | Status: AC | PRN
  Administered 2023-11-20: 500 [IU]

## 2023-11-20 MED ORDER — MIDAZOLAM HCL 2 MG/2ML IJ SOLN
INTRAMUSCULAR | Status: AC | PRN
  Administered 2023-11-20: 1 mg via INTRAVENOUS

## 2023-11-20 MED ORDER — LIDOCAINE HCL (PF) 1 % IJ SOLN
10.0000 mL | Freq: Once | INTRAMUSCULAR | Status: AC
  Administered 2023-11-20: 10 mL via INTRADERMAL

## 2023-11-20 MED ORDER — FENTANYL CITRATE (PF) 100 MCG/2ML IJ SOLN
INTRAMUSCULAR | Status: AC | PRN
  Administered 2023-11-20: 50 ug via INTRAVENOUS
  Administered 2023-11-20: 25 ug via INTRAVENOUS

## 2023-11-20 MED ORDER — MIDAZOLAM HCL 2 MG/2ML IJ SOLN
INTRAMUSCULAR | Status: AC
  Filled 2023-11-20: qty 2

## 2023-11-20 NOTE — H&P (Signed)
 Chief Complaint: Patient was seen in consultation today for lung cancer with metastasis to brain, with consideration for biopsy of FDG avid left supraclavicular lymph node.  Referring Provider(s): Dr. Marlene Simas, MD   Supervising Physician: Fernando Hoyer  Patient Status: [facility] - Out-pt  Patient is Full Code  History of Present Illness: Nicolas Allen is a 68 y.o. male  with PMHx notable for HTN, HLD, non-small cell lung cancer, anemia, GERD, IIBS, ED, anxiety, and tobacco abuse.  Per Dr. Bing Buff progress not on 5/13: "Stage 4 non-small cell lung cancer, favoring adenocarcinoma, diagnosed in October 2021. Status post SRS to brain lesion and palliative systemic chemotherapy with carboplatin , pemetrexed , and pembrolizumab  for four cycles, followed by maintenance with pemetrexed  and pembrolizumab , and then single-agent pembrolizumab . Completed 43 cycles, with the last dose on September 23, 2022. Currently asymptomatic with no chest pain, dyspnea, nausea, vomiting, or diarrhea.   Enlarged left supraclavicular lymph node Newly enlarged left supraclavicular lymph node, increasing from 2.1 x 2.9 cm to 3.9 x 3.2 cm. Potential metastasis from lung cancer, as lung cancer commonly metastasizes to lymph nodes. No other concerning findings on the scan. PET scan recommended to assess metabolic activity and determine if radiation therapy is warranted. - Order PET scan to evaluate the activity of the left supraclavicular lymph node. - Consider radiation therapy if PET scan indicates high activity and concern for cancer. - Schedule follow-up in three weeks to discuss PET scan results and further management.   The patient was advised to call immediately if he has any concerning symptoms in the interval. The patient voices understanding of current disease status and treatment options and is in agreement with the current care plan."   Interventional Radiology was requested for core biopsy of left  supraclavicular lymph node . Request was reviewed and approved by Dr. Marne Sings. Patient is scheduled for same in IR today.   Patient is alert and laying in bed, calm. Wife is at bedside. Patient is currently without any significant complaints.  Patient denies any fevers, headache, chest pain, SOB, cough, abdominal pain, nausea, vomiting or bleeding.     Past Medical History:  Diagnosis Date   Anemia 06/24/2012   ANXIETY 02/03/2007   Cervical disc disease 02/12/2012   S/p surgury jan 2013   Erectile dysfunction 02/11/2011   GERD (gastroesophageal reflux disease)    GLUCOSE INTOLERANCE 01/04/2008   HYPERLIPIDEMIA 02/03/2007   HYPERTENSION 02/03/2007   IBS (irritable bowel syndrome)    Impaired glucose tolerance 02/11/2011   nscl ca 03/30/2020   Smoker 08/22/2014   Substance abuse (HCC) 2001   sober for 16 yrs    Past Surgical History:  Procedure Laterality Date   BRONCHIAL BIOPSY  03/30/2020   Procedure: BRONCHIAL BIOPSIES;  Surgeon: Prudy Brownie, DO;  Location: [facility];  Service: Pulmonary;;   BRONCHIAL BRUSHINGS  03/30/2020   Procedure: BRONCHIAL BRUSHINGS;  Surgeon: Prudy Brownie, DO;  Location: [facility];  Service: Pulmonary;;   BRONCHIAL NEEDLE ASPIRATION BIOPSY  03/30/2020   Procedure: BRONCHIAL NEEDLE ASPIRATION BIOPSIES;  Surgeon: Prudy Brownie, DO;  Location: [facility];  Service: Pulmonary;;   BRONCHIAL WASHINGS  03/30/2020   Procedure: BRONCHIAL WASHINGS;  Surgeon: Prudy Brownie, DO;  Location: [facility];  Service: Pulmonary;;   COLONOSCOPY  2007   IR IMAGING GUIDED PORT INSERTION  04/23/2020   neck fusion  2012   C4   NO PAST SURGERIES     VIDEO BRONCHOSCOPY WITH ENDOBRONCHIAL NAVIGATION N/A  03/30/2020   Procedure: VIDEO BRONCHOSCOPY WITH ENDOBRONCHIAL NAVIGATION;  Surgeon: Prudy Brownie, DO;  Location: [facility];  Service: Pulmonary;  Laterality: N/A;   VIDEO BRONCHOSCOPY WITH ENDOBRONCHIAL ULTRASOUND N/A 03/30/2020   Procedure:  VIDEO BRONCHOSCOPY WITH ENDOBRONCHIAL ULTRASOUND;  Surgeon: Prudy Brownie, DO;  Location: [facility];  Service: Pulmonary;  Laterality: N/A;    Allergies: Patient has no known allergies.  Medications: Prior to Admission medications   Medication Sig Start Date End Date Taking? Authorizing Provider  amLODipine -benazepril  (LOTREL) 10-20 MG capsule TAKE 1 CAPSULE BY MOUTH ONCE DAILY 08/26/23  Yes Roslyn Coombe, MD  aspirin  325 MG EC tablet Take 1 tablet (325 mg total) by mouth daily. 04/08/21  Yes Roslyn Coombe, MD  b complex vitamins tablet Take 1 tablet by mouth daily.   Yes [provider]  cholecalciferol (VITAMIN D3) 25 MCG (1000 UNIT) tablet Take 1,000 Units by mouth daily.   Yes [provider]  folic acid  (FOLVITE ) 1 MG tablet Take 1 tablet by mouth once daily 04/02/23  Yes Roslyn Coombe, MD  Garlic 1000 MG CAPS Take 1,000 mg by mouth daily.    Yes [provider]  Omega-3 Fatty Acids (FISH OIL PO) Take 1 capsule by mouth daily.    Yes [provider]  rosuvastatin  (CRESTOR ) 40 MG tablet Take 1 tablet (40 mg total) by mouth daily. 08/26/23  Yes Roslyn Coombe, MD  lidocaine -prilocaine  (EMLA ) cream Apply to the Port-A-Cath site 30-60 minutes before chemotherapy. 04/17/20   Marlene Simas, MD  polyethylene glycol (MIRALAX / GLYCOLAX) 17 g packet Take 17 g by mouth daily as needed.    [provider]  predniSONE  (DELTASONE ) 10 MG tablet 3 tabs by mouth per day for 3 days,2tabs per day for 3 days,1tab per day for 3 days 06/03/23   Roslyn Coombe, MD  prochlorperazine  (COMPAZINE ) 10 MG tablet Take 1 tablet (10 mg total) by mouth every 6 (six) hours as needed for nausea or vomiting. 11/20/20   Marlene Simas, MD  triamcinolone  cream (KENALOG ) 0.1 % Apply 1 Application topically 2 (two) times daily. 06/03/23 06/02/24  Roslyn Coombe, MD  vardenafil  (LEVITRA ) 20 MG tablet TAKE 1 TABLET BY MOUTH AS NEEDED FOR  ERECTILE  DYSFUNCTION 02/24/20   Roslyn Coombe, MD     Family History  Problem Relation Age of Onset   Cancer Mother        esophagus   Cancer Cousin    Stroke Other    Hypertension Other    Colon cancer Neg Hx     Social History   Socioeconomic History   Marital status: Married    Spouse name: Ave Leisure   Number of children: Not on file   Years of education: Not on file   Highest education level: Not on file  Occupational History   Occupation: janitor  Tobacco Use   Smoking status: Former    Current packs/day: 0.00    Average packs/day: 1.5 packs/day for 30.0 years (45.0 ttl pk-yrs)    Types: Cigarettes    Start date: 02/21/1990    Quit date: 02/22/2020    Years since quitting: 3.7   Smokeless tobacco: Former    Types: Engineer, drilling   Vaping status: Never Used  Substance and Sexual Activity   Alcohol use: No    Alcohol/week: 0.0 standard drinks of alcohol    Comment: quit 2000 prior heavy   Drug use: No   Sexual activity:  Not on file  Other Topics Concern   Not on file  Social History Narrative   No children.   Social Drivers of Corporate investment banker Strain: Low Risk  (02/26/2023)   Overall Financial Resource Strain (CARDIA)    Difficulty of Paying Living Expenses: Not very hard  Food Insecurity: No Food Insecurity (08/24/2023)   Hunger Vital Sign    Worried About Running Out of Food in the Last Year: Never true    Ran Out of Food in the Last Year: Never true  Transportation Needs: No Transportation Needs (08/24/2023)   PRAPARE - Administrator, Civil Service (Medical): No    Lack of Transportation (Non-Medical): No  Physical Activity: Insufficiently Active (02/26/2023)   Exercise Vital Sign    Days of Exercise per Week: 5 days    Minutes of Exercise per Session: 20 min  Stress: No Stress Concern Present (02/26/2023)   [employer] of Occupational Health - Occupational Stress Questionnaire    Feeling of Stress : Not at all  Social Connections: Moderately Integrated (02/26/2023)    Social Connection and Isolation Panel [NHANES]    Frequency of Communication with Friends and Family: More than three times a week    Frequency of Social Gatherings with Friends and Family: Once a week    Attends Religious Services: 1 to 4 times per year    Active Member of [employer] or Organizations: No    Attends Banker Meetings: Never    Marital Status: Married     Review of Systems: A 12 point ROS discussed and pertinent positives are indicated in the HPI above.  All other systems are negative.  Vital Signs: BP 130/68   Temp 98 F (36.7 C) (Oral)   Resp 18   Ht 6' (1.829 m)   Wt 169 lb (76.7 kg)   SpO2 96%   BMI 22.92 kg/m   Advance Care Plan: The advanced care place/surrogate decision maker was discussed at the time of visit and the patient did not wish to discuss or was not able to name a surrogate decision maker or provide an advance care plan.  Physical Exam Vitals reviewed.  Constitutional:      General: He is not in acute distress.    Appearance: Normal appearance.  HENT:     Mouth/Throat:     Mouth: Mucous membranes are dry.  Cardiovascular:     Rate and Rhythm: Normal rate and regular rhythm.     Pulses: Normal pulses.     Heart sounds: No murmur heard. Pulmonary:     Effort: Pulmonary effort is normal. No respiratory distress.     Breath sounds: Normal breath sounds.  Musculoskeletal:        General: Normal range of motion.     Cervical back: Normal range of motion and neck supple. No tenderness.  Skin:    General: Skin is warm and dry.  Neurological:     Mental Status: He is alert and oriented to person, place, and time.  Psychiatric:        Mood and Affect: Mood normal.        Behavior: Behavior normal.        Thought Content: Thought content normal.        Judgment: Judgment normal.     Imaging: NM PET Image Initial (PI) Skull Base To Thigh (F-18 FDG) Result Date: 11/06/2023 CLINICAL DATA:  Non-small-cell lung cancer staging. EXAM:  NUCLEAR MEDICINE PET SKULL BASE  TO THIGH TECHNIQUE: 8.43 mCi F-18 FDG was injected intravenously. Full-ring PET imaging was performed from the skull base to thigh after the radiotracer. CT data was obtained and used for attenuation correction and anatomic localization. Fasting blood glucose: 83 mg/dl COMPARISON:  PET-CT scan 04/02/2020. CT scan chest abdomen pelvis 10/19/2023 and older. FINDINGS: Mediastinal blood pool activity: SUV max 2.4 Liver activity: SUV max 2.8 NECK: Developing several abnormal hypermetabolic supraclavicular left-sided lymph nodes are identified. The area overall has maximum SUV value of 18.3. This likely a cluster of several nodes. Dominant focus seen more inferior medial on image 44 today is measured at 4.3 by 3.7 cm, similar to the recent chest CT scan. There are no additional areas of abnormal nodal uptake in the neck. Near symmetric uptake of the intracranial compartment. Incidental CT findings: Streak artifact related to the patient's dental hardware. Soft tissue nodule along the right parotid gland is not hypermetabolic and unchanged from the remote PET-CT. The left parotid gland, the submandibular glands are unremarkable. Thyroid  gland is unremarkable. Streak artifact related to patient's spinal fixation hardware. Mild opacity along the inferior aspect of the right maxillary sinus. CHEST: Complex cavitary lesion in the left lower lobe is again identified. This has a air-fluid level today. Low level uptake maximum SUV value 3.6. There is persistent surrounding ground-glass and lung opacity in the left lower lobe compared to the recent CT scan. Please correlate for history. The patchy opacities identified in the right posterolateral lower lobe and right upper lobe that are nodular are also stable. The lateral right lower lobe focus has mild uptake maximum SUV of 2.5. This area was seen on the previous examination of the PET from 2021 and had maximum SUV value at that time 2.5. The upper  lobe focus has also minimal uptake and similar. These areas have demonstrated long-term stability However there is a new focus of opacity along the superomedial right lower lobe medially posterior to the right main bronchus. This area has maximum SUV of 5.8 and on CT image 72 measures 2.2 by 0.8 cm. The uptake is more along the medial aspect of the lesion. Of interest is been present since at least the PET-CT scan of 2021 but the uptake is increased. Previous maximum SUV left 3.3. No abnormal uptake above blood pool in the axillary regions, hilum or mediastinum. Incidental CT findings: Small right pleural effusion. No pneumothorax. Emphysematous lung changes. Right IJ chest port. Small pericardial effusion. Heart is nonenlarged. Normal caliber thoracic esophagus. ABDOMEN/PELVIS: Physiologic distribution radiotracer along the parenchymal organs, bowel and renal collecting systems. Incidental CT findings: Grossly the liver, spleen, adrenal glands and pancreas are unremarkable. Mild right-sided renal atrophy. No abnormal calcifications seen within either kidney nor along the course of either ureter. Preserved contour to the urinary bladder. Stomach is collapsed. Moderate colonic stool. Normal retrocecal appendix. Gallbladder is present. Diffuse vascular calcifications. SKELETON: No specific abnormal uptake identified along the visualized osseous structures. Incidental CT findings: Scattered degenerative changes. Again fixation hardware along the lower cervical spine. IMPRESSION: Significant abnormal hypermetabolism along the enlarging left supraclavicular lymph nodes. No additional abnormal lymph nodes identified. This is worrisome for a neoplasm. Is uncertain this is a metastatic lesion or new area of disease. Recommend further evaluation such as this tissue sampling. Bilateral areas of lung nodularity including the complex cystic and solid focus in left lower lobe as seen on previous examinations. The associated  opacity along the left lower lobe lesion is similar to the recent chest CT. Please  correlate with any infectious or inflammatory process with low-level uptake. There are air-fluid levels within the mixed cystic and solid focus. Nodular lesions that are somewhat ill-defined in the right lung are also identified and a present since 2021 although there is new significant uptake with in the medial aspect of lesion seen in the superior segment of the right lower lobe immediately posterior to the course of the right main bronchus. With the maximum SUV of 5.8, malignant process is possible but lesion is slightly some present for at least 4 years. Please correlate for atypical infectious or inflammatory process or a secondary malignancy developing within the lesion. Options would include sampling versus close follow up surveillance. Electronically Signed   By: Adrianna Horde M.D.   On: 11/06/2023 17:11    Labs:  CBC: Recent Labs    04/14/23 1054 07/22/23 1510 10/19/23 1508 11/18/23 0907  WBC 3.6* 4.9 4.8 4.2  HGB 10.9* 13.3 11.9* 11.5*  HCT 32.9* 40.4 35.7* 34.1*  PLT 128* 128* 146* 114*    COAGS: No results for input(s): "INR", "APTT" in the last 8760 hours.  BMP: Recent Labs    04/14/23 1054 07/22/23 1510 10/19/23 1508 11/18/23 0907  NA 140 135 137 137  K 4.0 4.0 4.0 3.7  CL 108 103 105 106  CO2 26 25 27 26   GLUCOSE 83 87 87 83  BUN 18 17 19 16   CALCIUM  8.8* 9.2 9.1 8.6*  CREATININE 1.49* 1.62* 1.81* 1.50*  GFRNONAA 51* 47* 41* 51*    LIVER FUNCTION TESTS: Recent Labs    04/14/23 1054 07/22/23 1510 10/19/23 1508 11/18/23 0907  BILITOT 0.4 0.4 0.4 0.4  AST 29 21 19 19   ALT 11 12 10 10   ALKPHOS 51 58 50 51  PROT 6.5 7.1 7.1 6.9  ALBUMIN 3.4* 3.8 3.8 3.6    TUMOR MARKERS: No results for input(s): "AFPTM", "CEA", "CA199", "CHROMGRNA" in the last 8760 hours.  Assessment and Plan: Per Dr. Bing Buff progress not on 5/13: "Enlarged left supraclavicular lymph node Newly  enlarged left supraclavicular lymph node, increasing from 2.1 x 2.9 cm to 3.9 x 3.2 cm. Potential metastasis from lung cancer, as lung cancer commonly metastasizes to lymph nodes. No other concerning findings on the scan. PET scan recommended to assess metabolic activity and determine if radiation therapy is warranted. - Order PET scan to evaluate the activity of the left supraclavicular lymph node. - Consider radiation therapy if PET scan indicates high activity and concern for cancer. - Schedule follow-up in three weeks to discuss PET scan results and further management."  Patient presents for scheduled biopsy of left supraclavicular lymph node in IR today.  Patient has been NPO since midnight.  All labs and medications are within acceptable parameters.  No pertinent allergies.   Risks and benefits of lymph node biopsy was discussed with the patient and/or patient's family including, but not limited to bleeding, infection, damage to adjacent structures or low yield requiring additional tests.  All of the questions were answered and there is agreement to proceed.  Consent signed and in chart.     Thank you for allowing our service to participate in TRAMAR BRUECKNER 's care.  Electronically Signed: Lovena Rubinstein, PA-C   11/20/2023, 9:57 AM      I spent a total of 30 Minutes in face to face in clinical consultation, greater than 50% of which was counseling/coordinating care for lung cancer with metastasis to brain, with consideration for biopsy of FDG avid  left supraclavicular lymph node.

## 2023-11-20 NOTE — Procedures (Signed)
 Interventional Radiology Procedure Note  Procedure: US  guided core biopsy of LEFT supraclavicular LN  Complications: None  Estimated Blood Loss: None  Recommendations: - DC home   Signed,  [name], MD

## 2023-11-23 ENCOUNTER — Telehealth: Payer: Self-pay | Admitting: Internal Medicine

## 2023-11-23 NOTE — Telephone Encounter (Signed)
 Copied from CRM [id number]. Topic: Clinical - Medication Question >> Nov 23, 2023  4:01 PM Oddis Bench wrote: Reason for CRM: Patient is calling bc cancer doctor took him off of the B/P medication she told him not to take them until Dr.John contacts him to let him know if he is to discontinue or take a lower dosage. The patient would like a call back.

## 2023-11-24 NOTE — Telephone Encounter (Signed)
 Ok to hold on restarting medication for now, but if he can, please check BP twice per day at home, and bring them to ROV early next week, or ok to call us  if BP is > 180/100   thanks

## 2023-11-25 LAB — SURGICAL PATHOLOGY

## 2023-11-25 NOTE — Telephone Encounter (Signed)
 Called and let Pt know and he states understanding.

## 2023-12-01 ENCOUNTER — Ambulatory Visit
Admission: RE | Admit: 2023-12-01 | Discharge: 2023-12-01 | Disposition: A | Discharge status: Home / Self Care | Source: Ambulatory Visit | Attending: Radiation Oncology | Admitting: Radiation Oncology

## 2023-12-01 DIAGNOSIS — Z87891 Personal history of nicotine dependence: Secondary | ICD-10-CM | POA: Diagnosis not present

## 2023-12-01 DIAGNOSIS — C779 Secondary and unspecified malignant neoplasm of lymph node, unspecified: Secondary | ICD-10-CM | POA: Diagnosis not present

## 2023-12-01 DIAGNOSIS — C3491 Malignant neoplasm of unspecified part of right bronchus or lung: Secondary | ICD-10-CM | POA: Insufficient documentation

## 2023-12-01 DIAGNOSIS — Z510 Encounter for antineoplastic radiation therapy: Secondary | ICD-10-CM | POA: Insufficient documentation

## 2023-12-01 DIAGNOSIS — C7931 Secondary malignant neoplasm of brain: Secondary | ICD-10-CM | POA: Insufficient documentation

## 2023-12-01 DIAGNOSIS — C3482 Malignant neoplasm of overlapping sites of left bronchus and lung: Secondary | ICD-10-CM | POA: Diagnosis not present

## 2023-12-01 DIAGNOSIS — C770 Secondary and unspecified malignant neoplasm of lymph nodes of head, face and neck: Secondary | ICD-10-CM | POA: Diagnosis not present

## 2023-12-01 DIAGNOSIS — C3481 Malignant neoplasm of overlapping sites of right bronchus and lung: Secondary | ICD-10-CM | POA: Diagnosis not present

## 2023-12-08 ENCOUNTER — Ambulatory Visit: Admitting: Radiation Oncology

## 2023-12-08 DIAGNOSIS — C3482 Malignant neoplasm of overlapping sites of left bronchus and lung: Secondary | ICD-10-CM | POA: Diagnosis not present

## 2023-12-08 DIAGNOSIS — C770 Secondary and unspecified malignant neoplasm of lymph nodes of head, face and neck: Secondary | ICD-10-CM | POA: Diagnosis not present

## 2023-12-08 DIAGNOSIS — C3481 Malignant neoplasm of overlapping sites of right bronchus and lung: Secondary | ICD-10-CM | POA: Diagnosis not present

## 2023-12-08 DIAGNOSIS — C7931 Secondary malignant neoplasm of brain: Secondary | ICD-10-CM | POA: Diagnosis not present

## 2023-12-08 DIAGNOSIS — C3491 Malignant neoplasm of unspecified part of right bronchus or lung: Secondary | ICD-10-CM | POA: Diagnosis not present

## 2023-12-08 DIAGNOSIS — C779 Secondary and unspecified malignant neoplasm of lymph node, unspecified: Secondary | ICD-10-CM | POA: Diagnosis not present

## 2023-12-08 DIAGNOSIS — Z510 Encounter for antineoplastic radiation therapy: Secondary | ICD-10-CM | POA: Diagnosis not present

## 2023-12-08 DIAGNOSIS — Z87891 Personal history of nicotine dependence: Secondary | ICD-10-CM | POA: Diagnosis not present

## 2023-12-09 ENCOUNTER — Inpatient Hospital Stay

## 2023-12-09 ENCOUNTER — Inpatient Hospital Stay (HOSPITAL_BASED_OUTPATIENT_CLINIC_OR_DEPARTMENT_OTHER): Admitting: Internal Medicine

## 2023-12-09 ENCOUNTER — Ambulatory Visit

## 2023-12-09 VITALS — BP 114/71 | HR 70 | Temp 98.3°F | Resp 17 | Ht 72.0 in | Wt 171.0 lb

## 2023-12-09 DIAGNOSIS — R591 Generalized enlarged lymph nodes: Secondary | ICD-10-CM | POA: Diagnosis not present

## 2023-12-09 DIAGNOSIS — C3490 Malignant neoplasm of unspecified part of unspecified bronchus or lung: Secondary | ICD-10-CM

## 2023-12-09 DIAGNOSIS — C3411 Malignant neoplasm of upper lobe, right bronchus or lung: Secondary | ICD-10-CM | POA: Diagnosis not present

## 2023-12-09 DIAGNOSIS — Z95828 Presence of other vascular implants and grafts: Secondary | ICD-10-CM

## 2023-12-09 DIAGNOSIS — Z87891 Personal history of nicotine dependence: Secondary | ICD-10-CM | POA: Diagnosis not present

## 2023-12-09 DIAGNOSIS — C7931 Secondary malignant neoplasm of brain: Secondary | ICD-10-CM | POA: Diagnosis not present

## 2023-12-09 DIAGNOSIS — Z79899 Other long term (current) drug therapy: Secondary | ICD-10-CM | POA: Diagnosis not present

## 2023-12-09 DIAGNOSIS — N289 Disorder of kidney and ureter, unspecified: Secondary | ICD-10-CM | POA: Diagnosis not present

## 2023-12-09 LAB — CMP (CANCER CENTER ONLY)
ALT: 8 U/L (ref 0–44)
AST: 18 U/L (ref 15–41)
Albumin: 3.5 g/dL (ref 3.5–5.0)
Alkaline Phosphatase: 48 U/L (ref 38–126)
Anion gap: 5 (ref 5–15)
BUN: 15 mg/dL (ref 8–23)
CO2: 26 mmol/L (ref 22–32)
Calcium: 9 mg/dL (ref 8.9–10.3)
Chloride: 106 mmol/L (ref 98–111)
Creatinine: 1.41 mg/dL — ABNORMAL HIGH (ref 0.61–1.24)
GFR, Estimated: 55 mL/min — ABNORMAL LOW (ref >60)
Glucose, Bld: 96 mg/dL (ref 70–99)
Potassium: 4 mmol/L (ref 3.5–5.1)
Sodium: 137 mmol/L (ref 135–145)
Total Bilirubin: 0.4 mg/dL (ref 0.0–1.2)
Total Protein: 6.9 g/dL (ref 6.5–8.1)

## 2023-12-09 LAB — CBC WITH DIFFERENTIAL (CANCER CENTER ONLY)
Abs Immature Granulocytes: 0.01 10*3/uL (ref 0.00–0.07)
Basophils Absolute: 0 10*3/uL (ref 0.0–0.1)
Basophils Relative: 1 %
Eosinophils Absolute: 0.1 10*3/uL (ref 0.0–0.5)
Eosinophils Relative: 3 %
HCT: 35.5 % — ABNORMAL LOW (ref 39.0–52.0)
Hemoglobin: 11.9 g/dL — ABNORMAL LOW (ref 13.0–17.0)
Immature Granulocytes: 0 %
Lymphocytes Relative: 44 %
Lymphs Abs: 1.8 10*3/uL (ref 0.7–4.0)
MCH: 26.2 pg (ref 26.0–34.0)
MCHC: 33.5 g/dL (ref 30.0–36.0)
MCV: 78.2 fL — ABNORMAL LOW (ref 80.0–100.0)
Monocytes Absolute: 0.5 10*3/uL (ref 0.1–1.0)
Monocytes Relative: 12 %
Neutro Abs: 1.6 10*3/uL — ABNORMAL LOW (ref 1.7–7.7)
Neutrophils Relative %: 40 %
Platelet Count: 113 10*3/uL — ABNORMAL LOW (ref 150–400)
RBC: 4.54 MIL/uL (ref 4.22–5.81)
RDW: 16.2 % — ABNORMAL HIGH (ref 11.5–15.5)
WBC Count: 4 10*3/uL (ref 4.0–10.5)
nRBC: 0 % (ref 0.0–0.2)

## 2023-12-09 MED ORDER — SODIUM CHLORIDE 0.9% FLUSH
10.0000 mL | Freq: Once | INTRAVENOUS | Status: AC
  Administered 2023-12-09: 10 mL

## 2023-12-09 MED ORDER — HEPARIN SOD (PORK) LOCK FLUSH 100 UNIT/ML IV SOLN
250.0000 [IU] | Freq: Once | INTRAVENOUS | Status: AC
  Administered 2023-12-09: 250 [IU]

## 2023-12-09 NOTE — Progress Notes (Signed)
 [facility] [phone]   [phone]  OFFICE PROGRESS NOTE  [name], MD [address] [place] [address]  DIAGNOSIS: Stage IV (T3, N2, M1c) non-small cell lung cancer favoring adenocarcinoma presented with right upper lobe lung mass in addition to right lower lobe, left lower lobe in addition to right hilar and mediastinal lymphadenopathy in addition to metastatic brain lesions diagnosed in October 2021.  Molecular studies by [employer]:  [id number], 9.5%,   PRIOR THERAPY:  1) SRS to 3 brain lesion under the care of [name]. 2) Systemic chemotherapy with carboplatin  for AUC of 5, Alimta  500 mg/M2 and Keytruda  200 mg IV every 3 weeks.  First dose April 24, 2020.  Status post 43 cycles.  Starting from cycle #5 the patient will be on maintenance treatment with Alimta  and Keytruda  every 3 weeks.  Last dose  was given on 09/23/2022 discontinued secondary to renal insufficiency and worsening edema  CURRENT THERAPY: Observation.  INTERVAL HISTORY: Nicolas Allen 68 y.o. male returns to the clinic today for follow-up visit accompanied by his wife. Discussed the use of AI scribe software for clinical note transcription with the patient, who gave verbal consent to proceed.  History of Present Illness   Nicolas Allen is a 68 year old male with metastatic adenocarcinoma who presents for evaluation and discussion of biopsy results. He is accompanied by his wife. He was referred by [name] for radiation treatment.  He has a history of metastatic adenocarcinoma, initially discovered in October 2021. He has been undergoing maintenance treatment with Alimta  and Keytruda  every three weeks, with a total of forty-three cycles administered. His last dose was on September 23, 2022.  Recently, metastatic adenocarcinoma was found in the left subarachnoid levicular area. Radiation therapy with ten fractions to this area is scheduled to begin  tomorrow.  He experiences low blood pressure, with a recent reading of 114/71. He also mentions experiencing occasional slow headaches.  His last MRI of the brain in March showed a three-millimeter lesion with a slight increase in size. No other issues or complaints aside from the occasional slow headache.         MEDICAL HISTORY: Past Medical History:  Diagnosis Date   Anemia 06/24/2012   ANXIETY 02/03/2007   Cervical disc disease 02/12/2012   S/p surgury jan 2013   Erectile dysfunction 02/11/2011   GERD (gastroesophageal reflux disease)    GLUCOSE INTOLERANCE 01/04/2008   HYPERLIPIDEMIA 02/03/2007   HYPERTENSION 02/03/2007   IBS (irritable bowel syndrome)    Impaired glucose tolerance 02/11/2011   nscl ca 03/30/2020   Smoker 08/22/2014   Substance abuse (HCC) 2001   sober for 16 yrs    ALLERGIES:  has no known allergies.  MEDICATIONS:  Current Outpatient Medications  Medication Sig Dispense Refill   amLODipine -benazepril  (LOTREL) 10-20 MG capsule TAKE 1 CAPSULE BY MOUTH ONCE DAILY 90 capsule 3   aspirin  325 MG EC tablet Take 1 tablet (325 mg total) by mouth daily. 90 tablet 99   b complex vitamins tablet Take 1 tablet by mouth daily.     cholecalciferol (VITAMIN D3) 25 MCG (1000 UNIT) tablet Take 1,000 Units by mouth daily.     folic acid  (FOLVITE ) 1 MG tablet Take 1 tablet by mouth once daily 90 tablet 3   Garlic 1000 MG CAPS Take 1,000 mg by mouth daily.      lidocaine -prilocaine  (EMLA ) cream Apply to the Port-A-Cath site  30-60 minutes before chemotherapy. 30 g 0   Omega-3 Fatty Acids (FISH OIL PO) Take 1 capsule by mouth daily.      polyethylene glycol (MIRALAX / GLYCOLAX) 17 g packet Take 17 g by mouth daily as needed.     predniSONE  (DELTASONE ) 10 MG tablet 3 tabs by mouth per day for 3 days,2tabs per day for 3 days,1tab per day for 3 days 18 tablet 0   prochlorperazine  (COMPAZINE ) 10 MG tablet Take 1 tablet (10 mg total) by mouth every 6 (six) hours as needed for  nausea or vomiting. 30 tablet 0   rosuvastatin  (CRESTOR ) 40 MG tablet Take 1 tablet (40 mg total) by mouth daily. 90 tablet 3   triamcinolone  cream (KENALOG ) 0.1 % Apply 1 Application topically 2 (two) times daily. 30 g 1   vardenafil  (LEVITRA ) 20 MG tablet TAKE 1 TABLET BY MOUTH AS NEEDED FOR  ERECTILE  DYSFUNCTION 10 tablet 5   No current facility-administered medications for this visit.    SURGICAL HISTORY:  Past Surgical History:  Procedure Laterality Date   BRONCHIAL BIOPSY  03/30/2020   Procedure: BRONCHIAL BIOPSIES;  Surgeon: Brenna Adine CROME, DO;  Location: [facility];  Service: Pulmonary;;   BRONCHIAL BRUSHINGS  03/30/2020   Procedure: BRONCHIAL BRUSHINGS;  Surgeon: Brenna Adine CROME, DO;  Location: [facility];  Service: Pulmonary;;   BRONCHIAL NEEDLE ASPIRATION BIOPSY  03/30/2020   Procedure: BRONCHIAL NEEDLE ASPIRATION BIOPSIES;  Surgeon: Brenna Adine CROME, DO;  Location: [facility];  Service: Pulmonary;;   BRONCHIAL WASHINGS  03/30/2020   Procedure: BRONCHIAL WASHINGS;  Surgeon: Brenna Adine CROME, DO;  Location: [facility];  Service: Pulmonary;;   COLONOSCOPY  2007   IR IMAGING GUIDED PORT INSERTION  04/23/2020   neck fusion  2012   C4   NO PAST SURGERIES     VIDEO BRONCHOSCOPY WITH ENDOBRONCHIAL NAVIGATION N/A 03/30/2020   Procedure: VIDEO BRONCHOSCOPY WITH ENDOBRONCHIAL NAVIGATION;  Surgeon: Brenna Adine CROME, DO;  Location: [facility];  Service: Pulmonary;  Laterality: N/A;   VIDEO BRONCHOSCOPY WITH ENDOBRONCHIAL ULTRASOUND N/A 03/30/2020   Procedure: VIDEO BRONCHOSCOPY WITH ENDOBRONCHIAL ULTRASOUND;  Surgeon: Brenna Adine CROME, DO;  Location: [facility];  Service: Pulmonary;  Laterality: N/A;    REVIEW OF SYSTEMS:  Constitutional: positive for fatigue Eyes: negative Ears, nose, mouth, throat, and face: negative Respiratory: negative Cardiovascular: negative Gastrointestinal: negative Genitourinary:negative Integument/breast: negative Hematologic/lymphatic:  positive for lymphadenopathy Musculoskeletal:negative Neurological: negative Behavioral/Psych: negative Endocrine: negative Allergic/Immunologic: negative   PHYSICAL EXAMINATION: General appearance: alert, cooperative, fatigued, and no distress Head: Normocephalic, without obvious abnormality, atraumatic Neck: marked anterior cervical adenopathy, no JVD, supple, symmetrical, trachea midline, thyroid  not enlarged, symmetric, no tenderness/mass/nodules, and left supraclavicular palpable lymph nodes Lymph nodes: Supraclavicular adenopathy: Left Resp: clear to auscultation bilaterally Back: symmetric, no curvature. ROM normal. No CVA tenderness. Cardio: regular rate and rhythm, S1, S2 normal, no murmur, click, rub or gallop GI: soft, non-tender; bowel sounds normal; no masses,  no organomegaly Extremities: extremities normal, atraumatic, no cyanosis or edema Neurologic: Alert and oriented X 3, normal strength and tone. Normal symmetric reflexes. Normal coordination and gait  ECOG PERFORMANCE STATUS: 1 - Symptomatic but completely ambulatory  Blood pressure 114/71, pulse 70, temperature 98.3 F (36.8 C), temperature source Oral, resp. rate 17, height 6' (1.829 m), weight 171 lb (77.6 kg), SpO2 100%.  LABORATORY DATA: Lab Results  Component Value Date   WBC 4.0 12/09/2023   HGB 11.9 (L) 12/09/2023   HCT 35.5 (L) 12/09/2023   MCV 78.2 (  L) 12/09/2023   PLT 113 (L) 12/09/2023      Chemistry      Component Value Date/Time   NA 137 12/09/2023 1022   K 4.0 12/09/2023 1022   CL 106 12/09/2023 1022   CO2 26 12/09/2023 1022   BUN 15 12/09/2023 1022   CREATININE 1.41 (H) 12/09/2023 1022   CREATININE 0.88 02/21/2020 1115      Component Value Date/Time   CALCIUM  9.0 12/09/2023 1022   ALKPHOS 48 12/09/2023 1022   AST 18 12/09/2023 1022   ALT 8 12/09/2023 1022   BILITOT 0.4 12/09/2023 1022       RADIOGRAPHIC STUDIES: US  CORE BIOPSY (LYMPH NODES) Result Date: 11/20/2023 INDICATION:  History of lung cancer with metastatic disease in the brain and left supraclavicular nodal station. He presents to confirm tissue diagnosis. EXAM: ULTRASOUND BIOPSY OF THE LYMPH NODES MEDICATIONS: None. ANESTHESIA/SEDATION: Moderate (conscious) sedation was employed during this procedure. A total of Versed  1 mg and Fentanyl  75 mcg was administered intravenously by the radiology nurse. Moderate Sedation Time: 10 minutes. The patient's level of consciousness and vital signs were monitored continuously by radiology nursing throughout the procedure under my direct supervision. FLUOROSCOPY TIME:  None. COMPLICATIONS: None immediate. PROCEDURE: Informed written consent was obtained from the patient after a thorough discussion of the procedural risks, benefits and alternatives. All questions were addressed. Maximal Sterile Barrier Technique was utilized including caps, mask, sterile gowns, sterile gloves, sterile drape, hand hygiene and skin antiseptic. A timeout was performed prior to the initiation of the procedure. The left supraclavicular station was interrogated with ultrasound. Multiple enlarged and abnormal appearing lymph nodes are visualized. Local anesthesia was attained by infiltration with 1% lidocaine . A small dermatotomy was made. Under real-time ultrasound guidance, multiple 18 gauge core biopsies were obtained using the Bard mission automated biopsy device. Biopsy specimens were placed in formalin and delivered to pathology for further analysis. Post biopsy ultrasound imaging demonstrates no evidence of immediate complication. The patient tolerated the procedure well. IMPRESSION: Successful ultrasound-guided core biopsy of left supraclavicular station lymph nodes. Electronically Signed   By: Wilkie Lent M.D.   On: 11/20/2023 14:42     ASSESSMENT AND PLAN: This is a very pleasant 68 years old African-American male recently diagnosed with stage IV (T3, N2, M1c)  non-small cell lung cancer,  adenocarcinoma presented with right upper lobe lung mass in addition to right lower lobe, left lower lobe in addition to right hilar and mediastinal lymphadenopathy in addition to metastatic brain lesions diagnosed in October 2021. The patient has molecular studies by [employer] that showed no actionable mutations. He underwent SRS treatment to his brain lesion. The patient underwent systemic chemotherapy with carboplatin  for AUC of 5, Alimta  500 mg/M2 and Keytruda  200 mg IV every 3 weeks status post 43 cycles.  Starting from cycle #5 the patient is on maintenance treatment with Alimta  and Keytruda  every 3 weeks. He discontinued maintenance Alimta  and Keytruda  at this point because of the renal insufficiency, worsening anemia and edema. He had CT scan followed by a PET scan performed recently that showed significant enlargement of left supraclavicular lymphadenopathy suspicious for disease recurrence versus new primary. He had ultrasound-guided core biopsy of the left supraclavicular lymph node and the final pathology was consistent with metastatic poorly differentiated carcinoma. The patient was referred to [name] and expected to start palliative radiotherapy to the left supraclavicular area soon. Assessment and Plan    Metastatic adenocarcinoma Metastatic adenocarcinoma with recent biopsy confirming cancer in  the left subarachnoid levicular area. He has been on maintenance treatment with Alimta  and Keytruda , with the last dose administered on September 23, 2022. The cancer is expected to have increased in size due to lack of recent treatment. Radiation therapy is anticipated to control the growth of the cancer. If new metastatic sites are identified, chemotherapy and immunotherapy may be resumed. - Start radiation therapy with ten fractions to the left subarachnoid levicular area. - Schedule follow-up in two months to evaluate response to radiation therapy. - Order CT scan of neck, chest, abdomen,  and pelvis in two months. - Consider resuming chemotherapy and immunotherapy if new metastatic sites are identified.  Hypotension Hypotension with a reading of 114/71 mmHg. He has been advised to stop taking antihypertensive medication. The current blood pressure is considered acceptable.    He underwent SBRT to the enlarging right middle lobe lung nodule under the care of [name].  He will have a Port-A-Cath flush every 6 weeks. For the worsening renal insufficiency, he was referred to nephrology for evaluation and management of his condition. He was advised to call immediately if he has any concerning symptoms in the interval. The patient voices understanding of current disease status and treatment options and is in agreement with the current care plan.  All questions were answered. The patient knows to call the clinic with any problems, questions or concerns. We can certainly see the patient much sooner if necessary. The total time spent in the appointment was 30 minutes.  Disclaimer: This note was dictated with voice recognition software. Similar sounding words can inadvertently be transcribed and may not be corrected upon review.

## 2023-12-10 ENCOUNTER — Other Ambulatory Visit: Payer: Self-pay

## 2023-12-10 ENCOUNTER — Ambulatory Visit
Admission: RE | Admit: 2023-12-10 | Discharge: 2023-12-10 | Disposition: A | Discharge status: Home / Self Care | Source: Ambulatory Visit | Attending: Radiation Oncology | Admitting: Radiation Oncology

## 2023-12-10 ENCOUNTER — Ambulatory Visit

## 2023-12-10 DIAGNOSIS — C770 Secondary and unspecified malignant neoplasm of lymph nodes of head, face and neck: Secondary | ICD-10-CM | POA: Diagnosis not present

## 2023-12-10 DIAGNOSIS — C7931 Secondary malignant neoplasm of brain: Secondary | ICD-10-CM | POA: Diagnosis not present

## 2023-12-10 DIAGNOSIS — Z510 Encounter for antineoplastic radiation therapy: Secondary | ICD-10-CM | POA: Diagnosis not present

## 2023-12-10 DIAGNOSIS — C3482 Malignant neoplasm of overlapping sites of left bronchus and lung: Secondary | ICD-10-CM | POA: Diagnosis not present

## 2023-12-10 DIAGNOSIS — Z87891 Personal history of nicotine dependence: Secondary | ICD-10-CM | POA: Diagnosis not present

## 2023-12-10 DIAGNOSIS — C779 Secondary and unspecified malignant neoplasm of lymph node, unspecified: Secondary | ICD-10-CM | POA: Diagnosis not present

## 2023-12-10 DIAGNOSIS — C3481 Malignant neoplasm of overlapping sites of right bronchus and lung: Secondary | ICD-10-CM | POA: Diagnosis not present

## 2023-12-10 DIAGNOSIS — C3491 Malignant neoplasm of unspecified part of right bronchus or lung: Secondary | ICD-10-CM | POA: Diagnosis not present

## 2023-12-10 LAB — RAD ONC ARIA SESSION SUMMARY
Course Elapsed Days: 0
Plan Fractions Treated to Date: 1
Plan Prescribed Dose Per Fraction: 3 Gy
Plan Total Fractions Prescribed: 10
Plan Total Prescribed Dose: 30 Gy
Reference Point Dosage Given to Date: 3 Gy
Reference Point Session Dosage Given: 3 Gy
Session Number: 1

## 2023-12-11 ENCOUNTER — Ambulatory Visit
Admission: RE | Admit: 2023-12-11 | Discharge: 2023-12-11 | Disposition: A | Discharge status: Home / Self Care | Source: Ambulatory Visit | Attending: Radiation Oncology

## 2023-12-11 ENCOUNTER — Other Ambulatory Visit: Payer: Self-pay

## 2023-12-11 ENCOUNTER — Ambulatory Visit
Admission: RE | Admit: 2023-12-11 | Discharge: 2023-12-11 | Disposition: A | Discharge status: Home / Self Care | Source: Ambulatory Visit | Attending: Radiation Oncology | Admitting: Radiation Oncology

## 2023-12-11 ENCOUNTER — Telehealth: Payer: Self-pay | Admitting: Internal Medicine

## 2023-12-11 DIAGNOSIS — C7931 Secondary malignant neoplasm of brain: Secondary | ICD-10-CM | POA: Diagnosis not present

## 2023-12-11 DIAGNOSIS — C770 Secondary and unspecified malignant neoplasm of lymph nodes of head, face and neck: Secondary | ICD-10-CM | POA: Diagnosis not present

## 2023-12-11 DIAGNOSIS — C779 Secondary and unspecified malignant neoplasm of lymph node, unspecified: Secondary | ICD-10-CM | POA: Diagnosis not present

## 2023-12-11 DIAGNOSIS — Z87891 Personal history of nicotine dependence: Secondary | ICD-10-CM | POA: Diagnosis not present

## 2023-12-11 DIAGNOSIS — C3491 Malignant neoplasm of unspecified part of right bronchus or lung: Secondary | ICD-10-CM | POA: Diagnosis not present

## 2023-12-11 DIAGNOSIS — C3481 Malignant neoplasm of overlapping sites of right bronchus and lung: Secondary | ICD-10-CM | POA: Diagnosis not present

## 2023-12-11 DIAGNOSIS — Z510 Encounter for antineoplastic radiation therapy: Secondary | ICD-10-CM | POA: Diagnosis not present

## 2023-12-11 DIAGNOSIS — C3482 Malignant neoplasm of overlapping sites of left bronchus and lung: Secondary | ICD-10-CM | POA: Diagnosis not present

## 2023-12-11 LAB — RAD ONC ARIA SESSION SUMMARY
Course Elapsed Days: 1
Plan Fractions Treated to Date: 2
Plan Prescribed Dose Per Fraction: 3 Gy
Plan Total Fractions Prescribed: 10
Plan Total Prescribed Dose: 30 Gy
Reference Point Dosage Given to Date: 6 Gy
Reference Point Session Dosage Given: 3 Gy
Session Number: 2

## 2023-12-11 NOTE — Telephone Encounter (Signed)
 Scheduled appointments with the patients wife.

## 2023-12-14 ENCOUNTER — Ambulatory Visit
Admission: RE | Admit: 2023-12-14 | Discharge: 2023-12-14 | Discharge status: Home / Self Care | Source: Ambulatory Visit | Attending: Radiation Oncology

## 2023-12-14 ENCOUNTER — Other Ambulatory Visit: Payer: Self-pay

## 2023-12-14 DIAGNOSIS — Z87891 Personal history of nicotine dependence: Secondary | ICD-10-CM | POA: Diagnosis not present

## 2023-12-14 DIAGNOSIS — C3482 Malignant neoplasm of overlapping sites of left bronchus and lung: Secondary | ICD-10-CM | POA: Diagnosis not present

## 2023-12-14 DIAGNOSIS — C3491 Malignant neoplasm of unspecified part of right bronchus or lung: Secondary | ICD-10-CM | POA: Diagnosis not present

## 2023-12-14 DIAGNOSIS — C779 Secondary and unspecified malignant neoplasm of lymph node, unspecified: Secondary | ICD-10-CM | POA: Diagnosis not present

## 2023-12-14 DIAGNOSIS — C3481 Malignant neoplasm of overlapping sites of right bronchus and lung: Secondary | ICD-10-CM | POA: Diagnosis not present

## 2023-12-14 DIAGNOSIS — C770 Secondary and unspecified malignant neoplasm of lymph nodes of head, face and neck: Secondary | ICD-10-CM | POA: Diagnosis not present

## 2023-12-14 DIAGNOSIS — Z510 Encounter for antineoplastic radiation therapy: Secondary | ICD-10-CM | POA: Diagnosis not present

## 2023-12-14 DIAGNOSIS — C7931 Secondary malignant neoplasm of brain: Secondary | ICD-10-CM | POA: Diagnosis not present

## 2023-12-14 LAB — RAD ONC ARIA SESSION SUMMARY
Course Elapsed Days: 4
Plan Fractions Treated to Date: 3
Plan Prescribed Dose Per Fraction: 3 Gy
Plan Total Fractions Prescribed: 10
Plan Total Prescribed Dose: 30 Gy
Reference Point Dosage Given to Date: 9 Gy
Reference Point Session Dosage Given: 3 Gy
Session Number: 3

## 2023-12-15 ENCOUNTER — Other Ambulatory Visit: Payer: Self-pay

## 2023-12-15 ENCOUNTER — Ambulatory Visit
Admission: RE | Admit: 2023-12-15 | Discharge: 2023-12-15 | Disposition: A | Discharge status: Home / Self Care | Source: Ambulatory Visit | Attending: Radiation Oncology | Admitting: Radiation Oncology

## 2023-12-15 ENCOUNTER — Ambulatory Visit (HOSPITAL_COMMUNITY): Admission: RE | Admit: 2023-12-15 | Source: Ambulatory Visit

## 2023-12-15 DIAGNOSIS — C779 Secondary and unspecified malignant neoplasm of lymph node, unspecified: Secondary | ICD-10-CM | POA: Insufficient documentation

## 2023-12-15 DIAGNOSIS — Z87891 Personal history of nicotine dependence: Secondary | ICD-10-CM | POA: Insufficient documentation

## 2023-12-15 DIAGNOSIS — C7931 Secondary malignant neoplasm of brain: Secondary | ICD-10-CM | POA: Diagnosis not present

## 2023-12-15 DIAGNOSIS — C3481 Malignant neoplasm of overlapping sites of right bronchus and lung: Secondary | ICD-10-CM | POA: Diagnosis not present

## 2023-12-15 DIAGNOSIS — Z510 Encounter for antineoplastic radiation therapy: Secondary | ICD-10-CM | POA: Insufficient documentation

## 2023-12-15 DIAGNOSIS — C770 Secondary and unspecified malignant neoplasm of lymph nodes of head, face and neck: Secondary | ICD-10-CM | POA: Diagnosis not present

## 2023-12-15 DIAGNOSIS — C3482 Malignant neoplasm of overlapping sites of left bronchus and lung: Secondary | ICD-10-CM | POA: Diagnosis not present

## 2023-12-15 DIAGNOSIS — C3491 Malignant neoplasm of unspecified part of right bronchus or lung: Secondary | ICD-10-CM | POA: Insufficient documentation

## 2023-12-15 LAB — RAD ONC ARIA SESSION SUMMARY
Course Elapsed Days: 5
Plan Fractions Treated to Date: 4
Plan Prescribed Dose Per Fraction: 3 Gy
Plan Total Fractions Prescribed: 10
Plan Total Prescribed Dose: 30 Gy
Reference Point Dosage Given to Date: 12 Gy
Reference Point Session Dosage Given: 3 Gy
Session Number: 4

## 2023-12-16 ENCOUNTER — Ambulatory Visit
Admission: RE | Admit: 2023-12-16 | Discharge: 2023-12-16 | Disposition: A | Discharge status: Home / Self Care | Source: Ambulatory Visit | Attending: Radiation Oncology | Admitting: Radiation Oncology

## 2023-12-16 ENCOUNTER — Other Ambulatory Visit: Payer: Self-pay

## 2023-12-16 DIAGNOSIS — Z510 Encounter for antineoplastic radiation therapy: Secondary | ICD-10-CM | POA: Diagnosis not present

## 2023-12-16 DIAGNOSIS — C3482 Malignant neoplasm of overlapping sites of left bronchus and lung: Secondary | ICD-10-CM | POA: Diagnosis not present

## 2023-12-16 DIAGNOSIS — C7931 Secondary malignant neoplasm of brain: Secondary | ICD-10-CM | POA: Diagnosis not present

## 2023-12-16 DIAGNOSIS — C779 Secondary and unspecified malignant neoplasm of lymph node, unspecified: Secondary | ICD-10-CM | POA: Diagnosis not present

## 2023-12-16 DIAGNOSIS — Z87891 Personal history of nicotine dependence: Secondary | ICD-10-CM | POA: Diagnosis not present

## 2023-12-16 DIAGNOSIS — C3491 Malignant neoplasm of unspecified part of right bronchus or lung: Secondary | ICD-10-CM | POA: Diagnosis not present

## 2023-12-16 DIAGNOSIS — C3481 Malignant neoplasm of overlapping sites of right bronchus and lung: Secondary | ICD-10-CM | POA: Diagnosis not present

## 2023-12-16 DIAGNOSIS — C770 Secondary and unspecified malignant neoplasm of lymph nodes of head, face and neck: Secondary | ICD-10-CM | POA: Diagnosis not present

## 2023-12-16 LAB — RAD ONC ARIA SESSION SUMMARY
Course Elapsed Days: 6
Plan Fractions Treated to Date: 5
Plan Prescribed Dose Per Fraction: 3 Gy
Plan Total Fractions Prescribed: 10
Plan Total Prescribed Dose: 30 Gy
Reference Point Dosage Given to Date: 15 Gy
Reference Point Session Dosage Given: 3 Gy
Session Number: 5

## 2023-12-17 ENCOUNTER — Ambulatory Visit
Admission: RE | Admit: 2023-12-17 | Discharge: 2023-12-17 | Disposition: A | Discharge status: Home / Self Care | Source: Ambulatory Visit | Attending: Radiation Oncology | Admitting: Radiation Oncology

## 2023-12-17 ENCOUNTER — Other Ambulatory Visit: Payer: Self-pay

## 2023-12-17 DIAGNOSIS — C770 Secondary and unspecified malignant neoplasm of lymph nodes of head, face and neck: Secondary | ICD-10-CM | POA: Diagnosis not present

## 2023-12-17 DIAGNOSIS — Z87891 Personal history of nicotine dependence: Secondary | ICD-10-CM | POA: Diagnosis not present

## 2023-12-17 DIAGNOSIS — Z510 Encounter for antineoplastic radiation therapy: Secondary | ICD-10-CM | POA: Diagnosis not present

## 2023-12-17 DIAGNOSIS — C3482 Malignant neoplasm of overlapping sites of left bronchus and lung: Secondary | ICD-10-CM | POA: Diagnosis not present

## 2023-12-17 DIAGNOSIS — C779 Secondary and unspecified malignant neoplasm of lymph node, unspecified: Secondary | ICD-10-CM | POA: Diagnosis not present

## 2023-12-17 DIAGNOSIS — C7931 Secondary malignant neoplasm of brain: Secondary | ICD-10-CM | POA: Diagnosis not present

## 2023-12-17 DIAGNOSIS — C3491 Malignant neoplasm of unspecified part of right bronchus or lung: Secondary | ICD-10-CM | POA: Diagnosis not present

## 2023-12-17 DIAGNOSIS — C3481 Malignant neoplasm of overlapping sites of right bronchus and lung: Secondary | ICD-10-CM | POA: Diagnosis not present

## 2023-12-17 LAB — RAD ONC ARIA SESSION SUMMARY
Course Elapsed Days: 7
Plan Fractions Treated to Date: 6
Plan Prescribed Dose Per Fraction: 3 Gy
Plan Total Fractions Prescribed: 10
Plan Total Prescribed Dose: 30 Gy
Reference Point Dosage Given to Date: 18 Gy
Reference Point Session Dosage Given: 3 Gy
Session Number: 6

## 2023-12-21 ENCOUNTER — Ambulatory Visit
Admission: RE | Admit: 2023-12-21 | Discharge: 2023-12-21 | Disposition: A | Discharge status: Home / Self Care | Source: Ambulatory Visit | Attending: Radiation Oncology | Admitting: Radiation Oncology

## 2023-12-21 ENCOUNTER — Other Ambulatory Visit: Payer: Self-pay

## 2023-12-21 DIAGNOSIS — C779 Secondary and unspecified malignant neoplasm of lymph node, unspecified: Secondary | ICD-10-CM | POA: Diagnosis not present

## 2023-12-21 DIAGNOSIS — C7931 Secondary malignant neoplasm of brain: Secondary | ICD-10-CM | POA: Diagnosis not present

## 2023-12-21 DIAGNOSIS — Z87891 Personal history of nicotine dependence: Secondary | ICD-10-CM | POA: Diagnosis not present

## 2023-12-21 DIAGNOSIS — C3491 Malignant neoplasm of unspecified part of right bronchus or lung: Secondary | ICD-10-CM | POA: Diagnosis not present

## 2023-12-21 DIAGNOSIS — C3482 Malignant neoplasm of overlapping sites of left bronchus and lung: Secondary | ICD-10-CM | POA: Diagnosis not present

## 2023-12-21 DIAGNOSIS — C3481 Malignant neoplasm of overlapping sites of right bronchus and lung: Secondary | ICD-10-CM | POA: Diagnosis not present

## 2023-12-21 DIAGNOSIS — C770 Secondary and unspecified malignant neoplasm of lymph nodes of head, face and neck: Secondary | ICD-10-CM | POA: Diagnosis not present

## 2023-12-21 DIAGNOSIS — Z510 Encounter for antineoplastic radiation therapy: Secondary | ICD-10-CM | POA: Diagnosis not present

## 2023-12-21 LAB — RAD ONC ARIA SESSION SUMMARY
Course Elapsed Days: 11
Plan Fractions Treated to Date: 7
Plan Prescribed Dose Per Fraction: 3 Gy
Plan Total Fractions Prescribed: 10
Plan Total Prescribed Dose: 30 Gy
Reference Point Dosage Given to Date: 21 Gy
Reference Point Session Dosage Given: 3 Gy
Session Number: 7

## 2023-12-22 ENCOUNTER — Ambulatory Visit
Admission: RE | Admit: 2023-12-22 | Discharge: 2023-12-22 | Disposition: A | Discharge status: Home / Self Care | Source: Ambulatory Visit | Attending: Radiation Oncology | Admitting: Radiation Oncology

## 2023-12-22 ENCOUNTER — Other Ambulatory Visit: Payer: Self-pay

## 2023-12-22 ENCOUNTER — Ambulatory Visit

## 2023-12-22 DIAGNOSIS — C7931 Secondary malignant neoplasm of brain: Secondary | ICD-10-CM | POA: Diagnosis not present

## 2023-12-22 DIAGNOSIS — Z510 Encounter for antineoplastic radiation therapy: Secondary | ICD-10-CM | POA: Diagnosis not present

## 2023-12-22 DIAGNOSIS — C3481 Malignant neoplasm of overlapping sites of right bronchus and lung: Secondary | ICD-10-CM | POA: Diagnosis not present

## 2023-12-22 DIAGNOSIS — C3491 Malignant neoplasm of unspecified part of right bronchus or lung: Secondary | ICD-10-CM | POA: Diagnosis not present

## 2023-12-22 DIAGNOSIS — C779 Secondary and unspecified malignant neoplasm of lymph node, unspecified: Secondary | ICD-10-CM | POA: Diagnosis not present

## 2023-12-22 DIAGNOSIS — C770 Secondary and unspecified malignant neoplasm of lymph nodes of head, face and neck: Secondary | ICD-10-CM | POA: Diagnosis not present

## 2023-12-22 DIAGNOSIS — C3482 Malignant neoplasm of overlapping sites of left bronchus and lung: Secondary | ICD-10-CM | POA: Diagnosis not present

## 2023-12-22 DIAGNOSIS — Z87891 Personal history of nicotine dependence: Secondary | ICD-10-CM | POA: Diagnosis not present

## 2023-12-22 LAB — RAD ONC ARIA SESSION SUMMARY
Course Elapsed Days: 12
Plan Fractions Treated to Date: 8
Plan Prescribed Dose Per Fraction: 3 Gy
Plan Total Fractions Prescribed: 10
Plan Total Prescribed Dose: 30 Gy
Reference Point Dosage Given to Date: 24 Gy
Reference Point Session Dosage Given: 3 Gy
Session Number: 8

## 2023-12-23 ENCOUNTER — Other Ambulatory Visit: Payer: Self-pay

## 2023-12-23 ENCOUNTER — Ambulatory Visit
Admission: RE | Admit: 2023-12-23 | Discharge: 2023-12-23 | Disposition: A | Discharge status: Home / Self Care | Source: Ambulatory Visit | Attending: Radiation Oncology

## 2023-12-23 DIAGNOSIS — Z510 Encounter for antineoplastic radiation therapy: Secondary | ICD-10-CM | POA: Diagnosis not present

## 2023-12-23 DIAGNOSIS — Z87891 Personal history of nicotine dependence: Secondary | ICD-10-CM | POA: Diagnosis not present

## 2023-12-23 DIAGNOSIS — C3481 Malignant neoplasm of overlapping sites of right bronchus and lung: Secondary | ICD-10-CM | POA: Diagnosis not present

## 2023-12-23 DIAGNOSIS — C3491 Malignant neoplasm of unspecified part of right bronchus or lung: Secondary | ICD-10-CM | POA: Diagnosis not present

## 2023-12-23 DIAGNOSIS — C779 Secondary and unspecified malignant neoplasm of lymph node, unspecified: Secondary | ICD-10-CM | POA: Diagnosis not present

## 2023-12-23 DIAGNOSIS — C770 Secondary and unspecified malignant neoplasm of lymph nodes of head, face and neck: Secondary | ICD-10-CM | POA: Diagnosis not present

## 2023-12-23 DIAGNOSIS — C7931 Secondary malignant neoplasm of brain: Secondary | ICD-10-CM | POA: Diagnosis not present

## 2023-12-23 DIAGNOSIS — C3482 Malignant neoplasm of overlapping sites of left bronchus and lung: Secondary | ICD-10-CM | POA: Diagnosis not present

## 2023-12-23 LAB — RAD ONC ARIA SESSION SUMMARY
Course Elapsed Days: 13
Plan Fractions Treated to Date: 9
Plan Prescribed Dose Per Fraction: 3 Gy
Plan Total Fractions Prescribed: 10
Plan Total Prescribed Dose: 30 Gy
Reference Point Dosage Given to Date: 27 Gy
Reference Point Session Dosage Given: 3 Gy
Session Number: 9

## 2023-12-24 ENCOUNTER — Other Ambulatory Visit: Payer: Self-pay

## 2023-12-24 ENCOUNTER — Ambulatory Visit
Admission: RE | Admit: 2023-12-24 | Discharge: 2023-12-24 | Disposition: A | Discharge status: Home / Self Care | Source: Ambulatory Visit | Attending: Radiation Oncology | Admitting: Radiation Oncology

## 2023-12-24 DIAGNOSIS — C779 Secondary and unspecified malignant neoplasm of lymph node, unspecified: Secondary | ICD-10-CM | POA: Diagnosis not present

## 2023-12-24 DIAGNOSIS — C3481 Malignant neoplasm of overlapping sites of right bronchus and lung: Secondary | ICD-10-CM | POA: Diagnosis not present

## 2023-12-24 DIAGNOSIS — C770 Secondary and unspecified malignant neoplasm of lymph nodes of head, face and neck: Secondary | ICD-10-CM | POA: Diagnosis not present

## 2023-12-24 DIAGNOSIS — C3491 Malignant neoplasm of unspecified part of right bronchus or lung: Secondary | ICD-10-CM | POA: Diagnosis not present

## 2023-12-24 DIAGNOSIS — Z87891 Personal history of nicotine dependence: Secondary | ICD-10-CM | POA: Diagnosis not present

## 2023-12-24 DIAGNOSIS — C7931 Secondary malignant neoplasm of brain: Secondary | ICD-10-CM | POA: Diagnosis not present

## 2023-12-24 DIAGNOSIS — Z510 Encounter for antineoplastic radiation therapy: Secondary | ICD-10-CM | POA: Diagnosis not present

## 2023-12-24 DIAGNOSIS — C3482 Malignant neoplasm of overlapping sites of left bronchus and lung: Secondary | ICD-10-CM | POA: Diagnosis not present

## 2023-12-24 LAB — RAD ONC ARIA SESSION SUMMARY
Course Elapsed Days: 14
Plan Fractions Treated to Date: 10
Plan Prescribed Dose Per Fraction: 3 Gy
Plan Total Fractions Prescribed: 10
Plan Total Prescribed Dose: 30 Gy
Reference Point Dosage Given to Date: 30 Gy
Reference Point Session Dosage Given: 3 Gy
Session Number: 10

## 2023-12-25 NOTE — Radiation Completion Notes (Signed)
  Radiation Oncology         [phone] ________________________________  Name: Nicolas Allen MRN: [id number]  Date of Service: 12/24/2023  DOB: 1956/01/04  End of Treatment Note  Diagnosis: Stage IV non-small cell lung cancer, adenocarcinoma involving multifocal disease in the lung and brain metastases   Intent: Palliative     ==========DELIVERED PLANS==========  First Treatment Date: 2023-12-10 Last Treatment Date: 2023-12-24   Plan Name: HN_L Site: Neck Left Technique: 3D Mode: Photon Dose Per Fraction: 3 Gy Prescribed Dose (Delivered / Prescribed): 30 Gy / 30 Gy Prescribed Fxs (Delivered / Prescribed): 10 / 10     ==========ON TREATMENT VISIT DATES========== 2023-12-11, 2023-12-17    See weekly On Treatment Notes in Epic for details in the Media tab (listed as Progress notes on the On Treatment Visit Dates listed above). The patient tolerated radiation. He developed fatigue and anticipated skin changes in the treatment field.   The patient will receive a call in about one month from the radiation oncology department. He will continue follow up with [name] as well as in our clinic while he's followed in the brain oncology program. He's due for his next MRI in early September.     Donald KYM Husband, PAC

## 2024-01-22 NOTE — Progress Notes (Signed)
  Radiation Oncology         [phone] ________________________________  Name: Nicolas Allen MRN: [id number]  Date of Service: 01/25/2024  DOB: [birth year]  Post Treatment Telephone Note  Diagnosis:  Stage IV non-small cell lung cancer, adenocarcinoma involving multifocal disease in the lung and brain metastases       The patient was available for call today.   Symptoms of fatigue have improved since completing therapy.  Symptoms of skin changes have improved since completing therapy.   Symptoms of esophagitis have improved since completing therapy. He reports that his throat is no longer sore.   The patient has scheduled follow up with his medical oncologist [name] on 08/21 for scan review and ongoing care, and was encouraged to call if he develops concerns or questions regarding radiation.

## 2024-01-25 ENCOUNTER — Ambulatory Visit
Admission: RE | Admit: 2024-01-25 | Discharge: 2024-01-25 | Disposition: A | Discharge status: Home / Self Care | Source: Ambulatory Visit | Attending: Internal Medicine | Admitting: Internal Medicine

## 2024-01-25 DIAGNOSIS — C779 Secondary and unspecified malignant neoplasm of lymph node, unspecified: Secondary | ICD-10-CM | POA: Insufficient documentation

## 2024-01-25 DIAGNOSIS — Z510 Encounter for antineoplastic radiation therapy: Secondary | ICD-10-CM | POA: Insufficient documentation

## 2024-01-25 DIAGNOSIS — C3491 Malignant neoplasm of unspecified part of right bronchus or lung: Secondary | ICD-10-CM | POA: Insufficient documentation

## 2024-01-25 DIAGNOSIS — Z87891 Personal history of nicotine dependence: Secondary | ICD-10-CM | POA: Insufficient documentation

## 2024-01-25 DIAGNOSIS — C7931 Secondary malignant neoplasm of brain: Secondary | ICD-10-CM | POA: Insufficient documentation

## 2024-01-29 ENCOUNTER — Inpatient Hospital Stay: Attending: Internal Medicine

## 2024-01-29 ENCOUNTER — Inpatient Hospital Stay

## 2024-01-29 ENCOUNTER — Ambulatory Visit (HOSPITAL_COMMUNITY)
Admission: RE | Admit: 2024-01-29 | Discharge: 2024-01-29 | Disposition: A | Discharge status: Home / Self Care | Source: Ambulatory Visit | Attending: Internal Medicine | Admitting: Internal Medicine

## 2024-01-29 DIAGNOSIS — R935 Abnormal findings on diagnostic imaging of other abdominal regions, including retroperitoneum: Secondary | ICD-10-CM | POA: Diagnosis not present

## 2024-01-29 DIAGNOSIS — C3490 Malignant neoplasm of unspecified part of unspecified bronchus or lung: Secondary | ICD-10-CM | POA: Diagnosis not present

## 2024-01-29 DIAGNOSIS — C7931 Secondary malignant neoplasm of brain: Secondary | ICD-10-CM | POA: Insufficient documentation

## 2024-01-29 DIAGNOSIS — C770 Secondary and unspecified malignant neoplasm of lymph nodes of head, face and neck: Secondary | ICD-10-CM | POA: Insufficient documentation

## 2024-01-29 DIAGNOSIS — C3411 Malignant neoplasm of upper lobe, right bronchus or lung: Secondary | ICD-10-CM | POA: Diagnosis not present

## 2024-01-29 DIAGNOSIS — C7889 Secondary malignant neoplasm of other digestive organs: Secondary | ICD-10-CM | POA: Diagnosis not present

## 2024-01-29 DIAGNOSIS — Z79899 Other long term (current) drug therapy: Secondary | ICD-10-CM | POA: Diagnosis not present

## 2024-01-29 DIAGNOSIS — Z95828 Presence of other vascular implants and grafts: Secondary | ICD-10-CM

## 2024-01-29 DIAGNOSIS — Z87891 Personal history of nicotine dependence: Secondary | ICD-10-CM | POA: Diagnosis not present

## 2024-01-29 DIAGNOSIS — G939 Disorder of brain, unspecified: Secondary | ICD-10-CM | POA: Diagnosis not present

## 2024-01-29 DIAGNOSIS — R590 Localized enlarged lymph nodes: Secondary | ICD-10-CM | POA: Diagnosis not present

## 2024-01-29 DIAGNOSIS — C259 Malignant neoplasm of pancreas, unspecified: Secondary | ICD-10-CM | POA: Diagnosis not present

## 2024-01-29 DIAGNOSIS — R221 Localized swelling, mass and lump, neck: Secondary | ICD-10-CM | POA: Diagnosis not present

## 2024-01-29 LAB — CBC WITH DIFFERENTIAL (CANCER CENTER ONLY)
Abs Immature Granulocytes: 0.01 K/uL (ref 0.00–0.07)
Basophils Absolute: 0 K/uL (ref 0.0–0.1)
Basophils Relative: 1 %
Eosinophils Absolute: 0.2 K/uL (ref 0.0–0.5)
Eosinophils Relative: 5 %
HCT: 38.6 % — ABNORMAL LOW (ref 39.0–52.0)
Hemoglobin: 13 g/dL (ref 13.0–17.0)
Immature Granulocytes: 0 %
Lymphocytes Relative: 39 %
Lymphs Abs: 1.4 K/uL (ref 0.7–4.0)
MCH: 26.3 pg (ref 26.0–34.0)
MCHC: 33.7 g/dL (ref 30.0–36.0)
MCV: 78.1 fL — ABNORMAL LOW (ref 80.0–100.0)
Monocytes Absolute: 0.5 K/uL (ref 0.1–1.0)
Monocytes Relative: 14 %
Neutro Abs: 1.5 K/uL — ABNORMAL LOW (ref 1.7–7.7)
Neutrophils Relative %: 41 %
Platelet Count: 120 K/uL — ABNORMAL LOW (ref 150–400)
RBC: 4.94 MIL/uL (ref 4.22–5.81)
RDW: 16.1 % — ABNORMAL HIGH (ref 11.5–15.5)
WBC Count: 3.6 K/uL — ABNORMAL LOW (ref 4.0–10.5)
nRBC: 0 % (ref 0.0–0.2)

## 2024-01-29 LAB — CMP (CANCER CENTER ONLY)
ALT: 8 U/L (ref 0–44)
AST: 17 U/L (ref 15–41)
Albumin: 3.4 g/dL — ABNORMAL LOW (ref 3.5–5.0)
Alkaline Phosphatase: 56 U/L (ref 38–126)
Anion gap: 5 (ref 5–15)
BUN: 16 mg/dL (ref 8–23)
CO2: 26 mmol/L (ref 22–32)
Calcium: 8.8 mg/dL — ABNORMAL LOW (ref 8.9–10.3)
Chloride: 106 mmol/L (ref 98–111)
Creatinine: 1.46 mg/dL — ABNORMAL HIGH (ref 0.61–1.24)
GFR, Estimated: 53 mL/min — ABNORMAL LOW (ref >60)
Glucose, Bld: 82 mg/dL (ref 70–99)
Potassium: 4 mmol/L (ref 3.5–5.1)
Sodium: 137 mmol/L (ref 135–145)
Total Bilirubin: 0.5 mg/dL (ref 0.0–1.2)
Total Protein: 6.7 g/dL (ref 6.5–8.1)

## 2024-01-29 MED ORDER — IOHEXOL 300 MG/ML  SOLN
100.0000 mL | Freq: Once | INTRAMUSCULAR | Status: AC | PRN
  Administered 2024-01-29: 100 mL via INTRAVENOUS

## 2024-01-29 MED ORDER — HEPARIN SOD (PORK) LOCK FLUSH 100 UNIT/ML IV SOLN
INTRAVENOUS | Status: AC
Start: 2024-01-29 — End: 2024-01-29
  Filled 2024-01-29: qty 5

## 2024-01-29 MED ORDER — SODIUM CHLORIDE 0.9% FLUSH
10.0000 mL | Freq: Once | INTRAVENOUS | Status: AC
Start: 2024-01-29 — End: 2024-01-29
  Administered 2024-01-29: 10 mL

## 2024-01-29 MED ORDER — HEPARIN SOD (PORK) LOCK FLUSH 100 UNIT/ML IV SOLN
500.0000 [IU] | Freq: Once | INTRAVENOUS | Status: AC
Start: 2024-01-29 — End: 2024-01-29
  Administered 2024-01-29: 500 [IU] via INTRAVENOUS

## 2024-02-04 ENCOUNTER — Inpatient Hospital Stay (HOSPITAL_BASED_OUTPATIENT_CLINIC_OR_DEPARTMENT_OTHER): Admitting: Internal Medicine

## 2024-02-04 VITALS — BP 124/77 | HR 67 | Temp 97.9°F | Resp 16 | Ht 72.0 in | Wt 166.0 lb

## 2024-02-04 DIAGNOSIS — Z87891 Personal history of nicotine dependence: Secondary | ICD-10-CM | POA: Diagnosis not present

## 2024-02-04 DIAGNOSIS — C7931 Secondary malignant neoplasm of brain: Secondary | ICD-10-CM | POA: Diagnosis not present

## 2024-02-04 DIAGNOSIS — C3490 Malignant neoplasm of unspecified part of unspecified bronchus or lung: Secondary | ICD-10-CM

## 2024-02-04 DIAGNOSIS — C3411 Malignant neoplasm of upper lobe, right bronchus or lung: Secondary | ICD-10-CM | POA: Diagnosis not present

## 2024-02-04 DIAGNOSIS — C770 Secondary and unspecified malignant neoplasm of lymph nodes of head, face and neck: Secondary | ICD-10-CM | POA: Diagnosis not present

## 2024-02-04 DIAGNOSIS — Z79899 Other long term (current) drug therapy: Secondary | ICD-10-CM | POA: Diagnosis not present

## 2024-02-04 DIAGNOSIS — C7889 Secondary malignant neoplasm of other digestive organs: Secondary | ICD-10-CM | POA: Diagnosis not present

## 2024-02-04 NOTE — Progress Notes (Unsigned)
 [facility] [phone]   [phone]  OFFICE PROGRESS NOTE  [name], MD [address] [place] [address]  DIAGNOSIS: Stage IV (T3, N2, M1c) non-small cell lung cancer favoring adenocarcinoma presented with right upper lobe lung mass in addition to right lower lobe, left lower lobe in addition to right hilar and mediastinal lymphadenopathy in addition to metastatic brain lesions diagnosed in October 2021.  Molecular studies by [employer]:  [id number], 9.5%,   PRIOR THERAPY:  1) SRS to 3 brain lesion under the care of [name]. 2) Systemic chemotherapy with carboplatin  for AUC of 5, Alimta  500 mg/M2 and Keytruda  200 mg IV every 3 weeks.  First dose April 24, 2020.  Status post 43 cycles.  Starting from cycle #5 the patient will be on maintenance treatment with Alimta  and Keytruda  every 3 weeks.  Last dose  was given on 09/23/2022 discontinued secondary to renal insufficiency and worsening edema 3) palliative radiotherapy to the left supraclavicular lymphadenopathy under the care of [name].  CURRENT THERAPY: Observation.  INTERVAL HISTORY: Nicolas Allen 68 y.o. male returns to the clinic today for follow-up visit accompanied by his wife. Discussed the use of AI scribe software for clinical note transcription with the patient, who gave verbal consent to proceed.  History of Present Illness             MEDICAL HISTORY: Past Medical History:  Diagnosis Date   Anemia 06/24/2012   ANXIETY 02/03/2007   Cervical disc disease 02/12/2012   S/p surgury jan 2013   Erectile dysfunction 02/11/2011   GERD (gastroesophageal reflux disease)    GLUCOSE INTOLERANCE 01/04/2008   HYPERLIPIDEMIA 02/03/2007   HYPERTENSION 02/03/2007   IBS (irritable bowel syndrome)    Impaired glucose tolerance 02/11/2011   nscl ca 03/30/2020   Smoker 08/22/2014   Substance abuse (HCC) 2001   sober for 16 yrs    ALLERGIES:  has no known  allergies.  MEDICATIONS:  Current Outpatient Medications  Medication Sig Dispense Refill   amLODipine -benazepril  (LOTREL) 10-20 MG capsule TAKE 1 CAPSULE BY MOUTH ONCE DAILY 90 capsule 3   aspirin  325 MG EC tablet Take 1 tablet (325 mg total) by mouth daily. 90 tablet 99   b complex vitamins tablet Take 1 tablet by mouth daily.     cholecalciferol (VITAMIN D3) 25 MCG (1000 UNIT) tablet Take 1,000 Units by mouth daily.     folic acid  (FOLVITE ) 1 MG tablet Take 1 tablet by mouth once daily 90 tablet 3   Garlic 1000 MG CAPS Take 1,000 mg by mouth daily.      lidocaine -prilocaine  (EMLA ) cream Apply to the Port-A-Cath site 30-60 minutes before chemotherapy. 30 g 0   Omega-3 Fatty Acids (FISH OIL PO) Take 1 capsule by mouth daily.      polyethylene glycol (MIRALAX / GLYCOLAX) 17 g packet Take 17 g by mouth daily as needed.     predniSONE  (DELTASONE ) 10 MG tablet 3 tabs by mouth per day for 3 days,2tabs per day for 3 days,1tab per day for 3 days 18 tablet 0   prochlorperazine  (COMPAZINE ) 10 MG tablet Take 1 tablet (10 mg total) by mouth every 6 (six) hours as needed for nausea or vomiting. 30 tablet 0   rosuvastatin  (CRESTOR ) 40 MG tablet Take 1 tablet (40 mg total) by mouth daily. 90 tablet 3   triamcinolone  cream (KENALOG ) 0.1 % Apply 1 Application topically 2 (two) times daily.  30 g 1   vardenafil  (LEVITRA ) 20 MG tablet TAKE 1 TABLET BY MOUTH AS NEEDED FOR  ERECTILE  DYSFUNCTION 10 tablet 5   No current facility-administered medications for this visit.    SURGICAL HISTORY:  Past Surgical History:  Procedure Laterality Date   BRONCHIAL BIOPSY  03/30/2020   Procedure: BRONCHIAL BIOPSIES;  Surgeon: Brenna Adine CROME, DO;  Location: [facility];  Service: Pulmonary;;   BRONCHIAL BRUSHINGS  03/30/2020   Procedure: BRONCHIAL BRUSHINGS;  Surgeon: Brenna Adine CROME, DO;  Location: [facility];  Service: Pulmonary;;   BRONCHIAL NEEDLE ASPIRATION BIOPSY  03/30/2020   Procedure: BRONCHIAL NEEDLE  ASPIRATION BIOPSIES;  Surgeon: Brenna Adine CROME, DO;  Location: [facility];  Service: Pulmonary;;   BRONCHIAL WASHINGS  03/30/2020   Procedure: BRONCHIAL WASHINGS;  Surgeon: Brenna Adine CROME, DO;  Location: [facility];  Service: Pulmonary;;   COLONOSCOPY  2007   IR IMAGING GUIDED PORT INSERTION  04/23/2020   neck fusion  2012   C4   NO PAST SURGERIES     VIDEO BRONCHOSCOPY WITH ENDOBRONCHIAL NAVIGATION N/A 03/30/2020   Procedure: VIDEO BRONCHOSCOPY WITH ENDOBRONCHIAL NAVIGATION;  Surgeon: Brenna Adine CROME, DO;  Location: [facility];  Service: Pulmonary;  Laterality: N/A;   VIDEO BRONCHOSCOPY WITH ENDOBRONCHIAL ULTRASOUND N/A 03/30/2020   Procedure: VIDEO BRONCHOSCOPY WITH ENDOBRONCHIAL ULTRASOUND;  Surgeon: Brenna Adine CROME, DO;  Location: [facility];  Service: Pulmonary;  Laterality: N/A;    REVIEW OF SYSTEMS:  Constitutional: positive for fatigue Eyes: negative Ears, nose, mouth, throat, and face: negative Respiratory: negative Cardiovascular: negative Gastrointestinal: negative Genitourinary:negative Integument/breast: negative Hematologic/lymphatic: positive for lymphadenopathy Musculoskeletal:negative Neurological: negative Behavioral/Psych: negative Endocrine: negative Allergic/Immunologic: negative   PHYSICAL EXAMINATION: General appearance: alert, cooperative, fatigued, and no distress Head: Normocephalic, without obvious abnormality, atraumatic Neck: marked anterior cervical adenopathy, no JVD, supple, symmetrical, trachea midline, thyroid  not enlarged, symmetric, no tenderness/mass/nodules, and left supraclavicular palpable lymph nodes Lymph nodes: Supraclavicular adenopathy: Left Resp: clear to auscultation bilaterally Back: symmetric, no curvature. ROM normal. No CVA tenderness. Cardio: regular rate and rhythm, S1, S2 normal, no murmur, click, rub or gallop GI: soft, non-tender; bowel sounds normal; no masses,  no organomegaly Extremities: extremities normal,  atraumatic, no cyanosis or edema Neurologic: Alert and oriented X 3, normal strength and tone. Normal symmetric reflexes. Normal coordination and gait  ECOG PERFORMANCE STATUS: 1 - Symptomatic but completely ambulatory  There were no vitals taken for this visit.  LABORATORY DATA: Lab Results  Component Value Date   WBC 3.6 (L) 01/29/2024   HGB 13.0 01/29/2024   HCT 38.6 (L) 01/29/2024   MCV 78.1 (L) 01/29/2024   PLT 120 (L) 01/29/2024      Chemistry      Component Value Date/Time   NA 137 01/29/2024 0901   K 4.0 01/29/2024 0901   CL 106 01/29/2024 0901   CO2 26 01/29/2024 0901   BUN 16 01/29/2024 0901   CREATININE 1.46 (H) 01/29/2024 0901   CREATININE 0.88 02/21/2020 1115      Component Value Date/Time   CALCIUM  8.8 (L) 01/29/2024 0901   ALKPHOS 56 01/29/2024 0901   AST 17 01/29/2024 0901   ALT 8 01/29/2024 0901   BILITOT 0.5 01/29/2024 0901       RADIOGRAPHIC STUDIES: CT CHEST ABDOMEN PELVIS W CONTRAST Result Date: 02/03/2024 CLINICAL DATA:  Non-small cell lung cancer staging EXAM: CT CHEST, ABDOMEN, AND PELVIS WITH CONTRAST TECHNIQUE: Multidetector CT imaging of the chest, abdomen and pelvis was performed following the standard protocol during  bolus administration of intravenous contrast. RADIATION DOSE REDUCTION: This exam was performed according to the departmental dose-optimization program which includes automated exposure control, adjustment of the mA and/or kV according to patient size and/or use of iterative reconstruction technique. CONTRAST:  OMNIPAQUE  IOHEXOL  300 MG/ML  SOLN COMPARISON:  PET-CT Nov 05, 2023. CT abdomen pelvis Oct 19 2023 FINDINGS: Lower neck: Left supraclavicular conglomerate lymphadenopathy is grossly stable to prior. They index nodal conglomerate measures 3.6 x 4 cm (2/9), stable to prior. Visualized thyroid  gland is unremarkable. CT CHEST FINDINGS Cardiovascular: The heart size is normal. Right-sided porta catheter tip terminates in  cavoatrial junction. Atherosclerotic calcifications of coronary arteries. Trace pericardial fluid. Mediastinum/Nodes: And subcentimeter lymph nodes in subcarinal prevascular nodal stations. Lungs/Pleura: Stable appearance of left lower lobe cavitary mass measuring approximately 8.9 x 6.1 cm with peripheral ground-glass attenuation and crazy paving appearance similar to prior exam, consistent with known malignancy. Additional multifocal sub solid masses and nodules throughout both lungs are stable to prior most consistent with the metastatic disease versus multifocal primaries. For example spiculated sub solid nodule in right lower lobe measures 3.5 cm (4/101), posteromedial right lower lobe measures 2.4 cm (4/80), right posterior apex measuring 1.7 cm (4/46) No new nodules identified. There is a questionable endotracheal nodule versus inspissated secretions measuring 7.7 mm along the posterolateral aspect of the right lower trachea (2/17), more conspicuous to prior. Upper lobe predominant mild centrilobular and paraseptal emphysematous changes. Interval resolution of right-sided pleural effusion. Musculoskeletal: No suspicious osseous lesion. CT ABDOMEN PELVIS FINDINGS Hepatobiliary: Left hepatic lobe subcentimeter lesion and stable to prior likely a cyst. No suspicious new enhancing lesion. Gallbladder is unremarkable. Pancreas: Hypodense lesion in uncinate process measuring 1 4 x 0.7 cm likely a cyst versus side branch IPMN, stable to prior. No pancreatic ductal dilatation or surrounding inflammatory changes. Spleen: Normal in size without focal abnormality. Adrenals/Urinary Tract: Adrenal glands are unremarkable. Kidneys are normal, without renal calculi, focal lesion, or hydronephrosis. Bladder is unremarkable. Stomach/Bowel: Stomach is within normal limits. Appendix is slightly prominent measures 6.5 mm however no prior evidence of fat stranding or fluid collection. Atherosclerotic calcifications. No evidence  of bowel wall thickening, distention, or inflammatory changes. Vascular/Lymphatic: Atherosclerotic calcifications. No enlarged abdominal or pelvic lymph nodes. Reproductive: Mild prostatomegaly. Other: Tiny fat containing right inguinal hernia or abnormality. No abdominopelvic ascites. Musculoskeletal: No acute or significant osseous findings. Degenerative changes of the spine. Status post ACDF. Multiple hypodense anterior chest wall subcutaneous nodules up to 3 cm are stable to prior (2/59, 62, 44, 40, 23. IMPRESSION: 1. Multifocal pulmonary masses and subsolid spiculated nodules including a large left lower lobe cavitary mass are grossly stable to prior. Findings are most consistent with synchronous multifocal primary versus primary left lower lobe with multi lobar metastases. Atypical infection/inflammation including tuberculosis may also cause cavitary lesions. Correlate with clinical findings and biopsy results. 2. Stable N3 nodal metastasis in left supraclavicular. 3. No new findings to suggest distant metastasis. 4. Hypodense lesion in pancreatic uncinate process, similar to prior likely a side branch IPMN. Follow-up according to guidelines below. 5. Multiple subcutaneous nodules along the anterior chest wall likely sebaceous cysts. [place] consensus guideline for intraductal papillary mucinous neoplasms (IPMNs): Optimal imaging surveillance strategies for suspected BD-IPMNs <3 cm and without worrisome features is unclear, but the yearly incidence of transformation to pancreatic cancer is estimated at 0.4-1.1% per year: -Largest cyst less than 1 cm: CT or MRI/MRCP in 2-3 years -Largest cyst 1-2 cm: CT or MRI/MRCP annually for 2  years, then lengthen interval if no change -Largest cyst 2-3 cm: EUS in 6 months, then lengthen interval alternating MRI with EUS as appropriate consider surgery in young patients American Gastroenter[employer] Association recommends stopping surveillance after 5 years if no significant  change is observed or if a cyst is resected and found to be benign This is not a recommendation that is explicitly stated in the [place] 2017 update 3. Electronically Signed   By: Megan  Zare M.D.   On: 02/03/2024 16:20   CT Soft Tissue Neck W Contrast Result Date: 02/03/2024 EXAM: CT NECK WITH CONTRAST 01/29/2024 10:47:00 AM TECHNIQUE: CT of the neck was performed with the administration of intravenous contrast. Multiplanar reformatted images are provided for review. Automated exposure control, iterative reconstruction, and/or weight based adjustment of the mA/kV was utilized to reduce the radiation dose to as low as reasonably achievable. CONTRAST: (iohexol  (OMNIPAQUE ) 300 MG/ML solution 100 mL IOHEXOL  300 MG/ML  SOLN) COMPARISON: None available. CLINICAL HISTORY: Neck mass, nonpulsatile. Malignant neoplasm of unspecified part of unspecified bronchus or lung (HCC). Per chart Stage IV (T3, N2, M1c) non-small cell lung cancer favoring adenocarcinoma presented with right upper lobe lung mass in addition to right lower lobe, left lower lobe in addition to right hilar and mediastinal lymphadenopathy. Metastatic brain lesions diagnosed in October 2021. FINDINGS: AERODIGESTIVE TRACT: No discrete mass. No edema. SALIVARY GLANDS: The parotid and submandibular glands are unremarkable. THYROID : Unremarkable. LYMPH NODES: There are multiple enlarged and partially necrotic lymph nodes of the left supraclavicular region to adjacent lymph nodes on series 2 image 95 measure 18 and 21 mm, respectively. A more inferior nodal mass abutting the proximal left internal jugular vein measures 34 mm. SOFT TISSUES: No mass or fluid collection. BRAIN, ORBITS, SINUSES AND MASTOIDS: Reported brain metastases are not visualized on this study. LUNGS AND MEDIASTINUM: Please refer to report for concomitant CT of the chest, abdomen and pelvis. BONES: No focal bone abnormality. Please refer to concomitant CT of the chest abdomen and pelvis  report for findings below the thoracic inlet. IMPRESSION: 1. Multiple enlarged and partially necrotic lymph nodes in the left supraclavicular region, likely metastatic disease. Electronically signed by: Franky Stanford MD 02/03/2024 03:22 PM EDT RP Workstation: [id number]     ASSESSMENT AND PLAN: This is a very pleasant 68 years old African-American male recently diagnosed with stage IV (T3, N2, M1c)  non-small cell lung cancer, adenocarcinoma presented with right upper lobe lung mass in addition to right lower lobe, left lower lobe in addition to right hilar and mediastinal lymphadenopathy in addition to metastatic brain lesions diagnosed in October 2021. The patient has molecular studies by [employer] that showed no actionable mutations. He underwent SRS treatment to his brain lesion. The patient underwent systemic chemotherapy with carboplatin  for AUC of 5, Alimta  500 mg/M2 and Keytruda  200 mg IV every 3 weeks status post 43 cycles.  Starting from cycle #5 the patient is on maintenance treatment with Alimta  and Keytruda  every 3 weeks. He discontinued maintenance Alimta  and Keytruda  at this point because of the renal insufficiency, worsening anemia and edema. He had CT scan followed by a PET scan performed recently that showed significant enlargement of left supraclavicular lymphadenopathy suspicious for disease recurrence versus new primary. He had ultrasound-guided core biopsy of the left supraclavicular lymph node and the final pathology was consistent with metastatic poorly differentiated carcinoma. The patient was referred to [name] and expected to start palliative radiotherapy to the left supraclavicular area soon.   He  underwent SBRT to the enlarging right middle lobe lung nodule under the care of [name].  He will have a Port-A-Cath flush every 6 weeks. For the worsening renal insufficiency, he was referred to nephrology for evaluation and management of his condition. He was advised to  call immediately if he has any concerning symptoms in the interval. The patient voices understanding of current disease status and treatment options and is in agreement with the current care plan.  All questions were answered. The patient knows to call the clinic with any problems, questions or concerns. We can certainly see the patient much sooner if necessary. The total time spent in the appointment was 30 minutes.  Disclaimer: This note was dictated with voice recognition software. Similar sounding words can inadvertently be transcribed and may not be corrected upon review.

## 2024-02-05 ENCOUNTER — Encounter: Payer: Self-pay | Admitting: Internal Medicine

## 2024-02-23 ENCOUNTER — Ambulatory Visit
Admission: RE | Admit: 2024-02-23 | Discharge: 2024-02-23 | Disposition: A | Discharge status: Home / Self Care | Source: Ambulatory Visit | Attending: Radiation Oncology | Admitting: Radiation Oncology

## 2024-02-23 DIAGNOSIS — C7931 Secondary malignant neoplasm of brain: Secondary | ICD-10-CM

## 2024-02-23 DIAGNOSIS — G9389 Other specified disorders of brain: Secondary | ICD-10-CM | POA: Diagnosis not present

## 2024-02-23 MED ORDER — GADOPICLENOL 0.5 MMOL/ML IV SOLN
7.5000 mL | Freq: Once | INTRAVENOUS | Status: AC | PRN
  Administered 2024-02-23: 7.5 mL via INTRAVENOUS

## 2024-02-26 ENCOUNTER — Encounter

## 2024-02-26 ENCOUNTER — Encounter: Payer: Self-pay | Admitting: Internal Medicine

## 2024-02-26 ENCOUNTER — Ambulatory Visit (INDEPENDENT_AMBULATORY_CARE_PROVIDER_SITE_OTHER): Admitting: Internal Medicine

## 2024-02-26 VITALS — BP 136/62 | HR 84 | Temp 98.2°F | Ht 72.0 in | Wt 167.4 lb

## 2024-02-26 DIAGNOSIS — E7800 Pure hypercholesterolemia, unspecified: Secondary | ICD-10-CM

## 2024-02-26 DIAGNOSIS — E538 Deficiency of other specified B group vitamins: Secondary | ICD-10-CM | POA: Diagnosis not present

## 2024-02-26 DIAGNOSIS — R7302 Impaired glucose tolerance (oral): Secondary | ICD-10-CM | POA: Diagnosis not present

## 2024-02-26 DIAGNOSIS — I10 Essential (primary) hypertension: Secondary | ICD-10-CM | POA: Diagnosis not present

## 2024-02-26 DIAGNOSIS — E559 Vitamin D deficiency, unspecified: Secondary | ICD-10-CM

## 2024-02-26 DIAGNOSIS — N1831 Chronic kidney disease, stage 3a: Secondary | ICD-10-CM

## 2024-02-26 NOTE — Progress Notes (Unsigned)
 Patient ID: Nicolas Allen, male   DOB: [birth year], 68 y.o.   MRN: [id number]        Chief Complaint: follow up htn, met lung ca, low b12 and d, ckd3a, hld, hyperglycemia       HPI:  Nicolas Allen is a 68 y.o. male here overall doing well, has to stop lotrel recently with lower BP's and now less dizzy and weak.  Has had metastatic lung ca with 5 mm to brain and indeterminate second lesion, but left base of neck mass much improved with recent XRT    Pt denies chest pain, increased sob or doe, wheezing, orthopnea, PND, increased LE swelling, palpitations, dizziness or syncope.  Pt denies polydipsia, polyuria, or new focal neuro s/s.          Wt Readings from Last 3 Encounters:  02/26/24 167 lb 6.4 oz (75.9 kg)  02/04/24 166 lb (75.3 kg)  12/09/23 171 lb (77.6 kg)   BP Readings from Last 3 Encounters:  02/26/24 136/62  02/04/24 124/77  12/09/23 114/71         Past Medical History:  Diagnosis Date   Anemia 06/24/2012   ANXIETY 02/03/2007   Cervical disc disease 02/12/2012   S/p surgury jan 2013   Erectile dysfunction 02/11/2011   GERD (gastroesophageal reflux disease)    GLUCOSE INTOLERANCE 01/04/2008   HYPERLIPIDEMIA 02/03/2007   HYPERTENSION 02/03/2007   IBS (irritable bowel syndrome)    Impaired glucose tolerance 02/11/2011   nscl ca 03/30/2020   Smoker 08/22/2014   Substance abuse (HCC) 2001   sober for 16 yrs   Past Surgical History:  Procedure Laterality Date   BRONCHIAL BIOPSY  03/30/2020   Procedure: BRONCHIAL BIOPSIES;  Surgeon: Brenna Adine CROME, DO;  [facility]: [facility];  Service: Pulmonary;;   BRONCHIAL BRUSHINGS  03/30/2020   Procedure: BRONCHIAL BRUSHINGS;  Surgeon: Brenna Adine CROME, DO;  [facility]: [facility];  Service: Pulmonary;;   BRONCHIAL NEEDLE ASPIRATION BIOPSY  03/30/2020   Procedure: BRONCHIAL NEEDLE ASPIRATION BIOPSIES;  Surgeon: Brenna Adine CROME, DO;  [facility]: [facility];  Service: Pulmonary;;   BRONCHIAL WASHINGS  03/30/2020   Procedure:  BRONCHIAL WASHINGS;  Surgeon: Brenna Adine CROME, DO;  Location: [facility];  Service: Pulmonary;;   COLONOSCOPY  2007   IR IMAGING GUIDED PORT INSERTION  04/23/2020   neck fusion  2012   C4   NO PAST SURGERIES     VIDEO BRONCHOSCOPY WITH ENDOBRONCHIAL NAVIGATION N/A 03/30/2020   Procedure: VIDEO BRONCHOSCOPY WITH ENDOBRONCHIAL NAVIGATION;  Surgeon: Brenna Adine CROME, DO;  [facility]: [facility];  Service: Pulmonary;  Laterality: N/A;   VIDEO BRONCHOSCOPY WITH ENDOBRONCHIAL ULTRASOUND N/A 03/30/2020   Procedure: VIDEO BRONCHOSCOPY WITH ENDOBRONCHIAL ULTRASOUND;  Surgeon: Brenna Adine CROME, DO;  Location: [facility];  Service: Pulmonary;  Laterality: N/A;    reports that he quit smoking about 4 years ago. His smoking use included cigarettes. He started smoking about 34 years ago. He has a 45 pack-year smoking history. He has quit using smokeless tobacco.  His smokeless tobacco use included chew. He reports that he does not drink alcohol and does not use drugs. family history includes Cancer in his cousin and mother; Hypertension in an other family member; Stroke in an other family member. No Known Allergies Current Outpatient Medications on File Prior to Visit  Medication Sig Dispense Refill   aspirin  325 MG EC tablet Take 1 tablet (325 mg total) by mouth daily. 90 tablet 99   b complex vitamins tablet Take  1 tablet by mouth daily.     cholecalciferol (VITAMIN D3) 25 MCG (1000 UNIT) tablet Take 1,000 Units by mouth daily.     folic acid  (FOLVITE ) 1 MG tablet Take 1 tablet by mouth once daily 90 tablet 3   Garlic 1000 MG CAPS Take 1,000 mg by mouth daily.      lidocaine -prilocaine  (EMLA ) cream Apply to the Port-A-Cath site 30-60 minutes before chemotherapy. 30 g 0   Omega-3 Fatty Acids (FISH OIL PO) Take 1 capsule by mouth daily.      polyethylene glycol (MIRALAX / GLYCOLAX) 17 g packet Take 17 g by mouth daily as needed.     predniSONE  (DELTASONE ) 10 MG tablet 3 tabs by mouth per day for 3  days,2tabs per day for 3 days,1tab per day for 3 days 18 tablet 0   prochlorperazine  (COMPAZINE ) 10 MG tablet Take 1 tablet (10 mg total) by mouth every 6 (six) hours as needed for nausea or vomiting. 30 tablet 0   rosuvastatin  (CRESTOR ) 40 MG tablet Take 1 tablet (40 mg total) by mouth daily. 90 tablet 3   triamcinolone  cream (KENALOG ) 0.1 % Apply 1 Application topically 2 (two) times daily. 30 g 1   vardenafil  (LEVITRA ) 20 MG tablet TAKE 1 TABLET BY MOUTH AS NEEDED FOR  ERECTILE  DYSFUNCTION 10 tablet 5   No current facility-administered medications on file prior to visit.        ROS:  All others reviewed and negative.  Objective        PE:  BP 136/62   Pulse 84   Temp 98.2 F (36.8 C)   Ht 6' (1.829 m)   Wt 167 lb 6.4 oz (75.9 kg)   SpO2 99%   BMI 22.70 kg/m                 Constitutional: Pt appears in NAD               HENT: Head: NCAT.                Right Ear: External ear normal.                 Left Ear: External ear normal.                Eyes: . Pupils are equal, round, and reactive to light. Conjunctivae and EOM are normal               Nose: without d/c or deformity               Neck: Neck supple. Gross normal ROM               Cardiovascular: Normal rate and regular rhythm.                 Pulmonary/Chest: Effort normal and breath sounds without rales or wheezing.                Abd:  Soft, NT, ND, + BS, no organomegaly               Neurological: Pt is alert. At baseline orientation, motor grossly intact               Skin: Skin is warm. No rashes, no other new lesions, LE edema - none               Psychiatric: Pt behavior is normal without agitation   Micro: none  Cardiac tracings  I have personally interpreted today:  none  Pertinent Radiological findings (summarize): none   Lab Results  Component Value Date   WBC 3.6 (L) 01/29/2024   HGB 13.0 01/29/2024   HCT 38.6 (L) 01/29/2024   PLT 120 (L) 01/29/2024   GLUCOSE 82 01/29/2024   CHOL 178 02/20/2023    TRIG 84.0 02/20/2023   HDL 57.90 02/20/2023   LDLCALC 104 (H) 02/20/2023   ALT 8 01/29/2024   AST 17 01/29/2024   NA 137 01/29/2024   K 4.0 01/29/2024   CL 106 01/29/2024   CREATININE 1.46 (H) 01/29/2024   BUN 16 01/29/2024   CO2 26 01/29/2024   TSH 0.78 02/20/2023   PSA 0.34 02/20/2023   INR 1.0 03/30/2020   HGBA1C 6.3 02/20/2023   Assessment/Plan:  Nicolas Allen is a 68 y.o. Black or African American [2] male with  has a past medical history of Anemia (06/24/2012), ANXIETY (02/03/2007), Cervical disc disease (02/12/2012), Erectile dysfunction (02/11/2011), GERD (gastroesophageal reflux disease), GLUCOSE INTOLERANCE (01/04/2008), HYPERLIPIDEMIA (02/03/2007), HYPERTENSION (02/03/2007), IBS (irritable bowel syndrome), Impaired glucose tolerance (02/11/2011), nscl ca (03/30/2020), Smoker (08/22/2014), and Substance abuse (HCC) (2001).  B12 deficiency Lab Results  Component Value Date   VITAMINB12 496 02/20/2023   Stable, cont oral replacement - b12 1000 mcg qd   CKD stage 3a, GFR 45-59 ml/min (HCC) Lab Results  Component Value Date   CREATININE 1.46 (H) 01/29/2024   Stable overall, cont to avoid nephrotoxins   Essential hypertension BP Readings from Last 3 Encounters:  02/26/24 136/62  02/04/24 124/77  12/09/23 114/71   Stable to borderline after stopping lotrel, pt to continue off med for now   Hyperlipidemia Lab Results  Component Value Date   LDLCALC 104 (H) 02/20/2023   uncontrolled, pt to continue current statin crestor  40 mg every day, low chol diet, declines other change   Impaired glucose tolerance Lab Results  Component Value Date   HGBA1C 6.3 02/20/2023   Stable, pt to continue current medical treatment  - diet, wt control   Vitamin D  deficiency Last vitamin D  Lab Results  Component Value Date   VD25OH 63.96 02/20/2023   Stable, cont oral replacement  Followup: Return in about 6 months (around 08/25/2024).  [name] Rush, MD 02/27/2024  4:03 PM [facility]

## 2024-02-26 NOTE — Patient Instructions (Signed)
 Please continue all other medications as before, including staying off the Blood Pressure medication for now  Please have the pharmacy call with any other refills you may need.  Please continue your efforts at being more active, low cholesterol diet, and weight control.  Please keep your appointments with your specialists as you may have planned - Oncology and Radiation Therapy  We can hold on further lab testing today  Please make an Appointment to return in 6 months, or sooner if needed, also with Lab Appointment for testing done 3-5 days before at the FIRST FLOOR Lab (so this is for TWO appointments - please see the scheduling desk as you leave)

## 2024-02-27 ENCOUNTER — Encounter: Payer: Self-pay | Admitting: Internal Medicine

## 2024-02-27 NOTE — Assessment & Plan Note (Signed)
 Lab Results  Component Value Date   LDLCALC 104 (H) 02/20/2023   uncontrolled, pt to continue current statin crestor  40 mg every day, low chol diet, declines other change

## 2024-02-27 NOTE — Assessment & Plan Note (Signed)
 BP Readings from Last 3 Encounters:  02/26/24 136/62  02/04/24 124/77  12/09/23 114/71   Stable to borderline after stopping lotrel, pt to continue off med for now

## 2024-02-27 NOTE — Assessment & Plan Note (Signed)
Last vitamin D Lab Results  Component Value Date   VD25OH 63.96 02/20/2023   Stable, cont oral replacement

## 2024-02-27 NOTE — Assessment & Plan Note (Signed)
 Lab Results  Component Value Date   CREATININE 1.46 (H) 01/29/2024   Stable overall, cont to avoid nephrotoxins

## 2024-02-27 NOTE — Assessment & Plan Note (Signed)
Lab Results  Component Value Date   HGBA1C 6.3 02/20/2023   Stable, pt to continue current medical treatment  - diet,wt control

## 2024-02-27 NOTE — Addendum Note (Signed)
 Addended by: [name] on: 02/27/2024 04:03 PM   Modules accepted: Orders

## 2024-02-27 NOTE — Assessment & Plan Note (Signed)
Lab Results  Component Value Date   VITAMINB12 496 02/20/2023   Stable, cont oral replacement - b12 1000 mcg qd

## 2024-02-29 ENCOUNTER — Encounter: Attending: Internal Medicine

## 2024-02-29 ENCOUNTER — Inpatient Hospital Stay

## 2024-02-29 ENCOUNTER — Ambulatory Visit
Admission: RE | Admit: 2024-02-29 | Discharge: 2024-02-29 | Disposition: A | Discharge status: Home / Self Care | Source: Ambulatory Visit | Attending: Radiation Oncology | Admitting: Radiation Oncology

## 2024-02-29 DIAGNOSIS — C770 Secondary and unspecified malignant neoplasm of lymph nodes of head, face and neck: Secondary | ICD-10-CM | POA: Diagnosis not present

## 2024-02-29 DIAGNOSIS — C7931 Secondary malignant neoplasm of brain: Secondary | ICD-10-CM | POA: Insufficient documentation

## 2024-02-29 DIAGNOSIS — C3491 Malignant neoplasm of unspecified part of right bronchus or lung: Secondary | ICD-10-CM | POA: Insufficient documentation

## 2024-02-29 DIAGNOSIS — C3482 Malignant neoplasm of overlapping sites of left bronchus and lung: Secondary | ICD-10-CM | POA: Diagnosis not present

## 2024-02-29 DIAGNOSIS — C3481 Malignant neoplasm of overlapping sites of right bronchus and lung: Secondary | ICD-10-CM | POA: Diagnosis not present

## 2024-02-29 NOTE — Progress Notes (Signed)
 Radiation Oncology         ([phone] ________________________________  Outpatient Follow Up - Conducted via telephone at patient request.  I spoke with the patient to conduct this visit via telephone. The patient was notified in advance and was offered an in person or telemedicine meeting to allow for face to face communication but instead preferred to proceed with a telephone visit.   Name: PRANAY HILBUN        MRN: [id number]  Date of Service: 02/29/2024 DOB: 08-03-1955  RR:[name], [name] ORN, MD  [name], MD     REFERRING PHYSICIAN: [name], MD   DIAGNOSIS: The primary encounter diagnosis was Malignant neoplasm metastatic to brain [facility]). A diagnosis of Non-small cell carcinoma of right lung, stage 4 (HCC) was also pertinent to this visit.   HISTORY OF PRESENT ILLNESS: Harper M Tow is a 68 y.o. male with a history of stage IV non-small cell lung cancer, adenocarcinoma involving multifocal disease in the lung and 3 brain metastases. He was diagnosed in the fall of 2021 and found to have metastatic disease in the lung and brain for which he has received stereotactic radiosurgery [facility]) with [name].  While he did have pseudoprogression due to his immunotherapy, there was growth in the same mid cerebellar lesion and after discussion in brain oncology conference, it was felt he should pursue re-irradiation which was completed with SRS  on 02/12/22. He completed consolidative chemo/immunotherapy on 09/25/22 with [name].   A CT CAP for surveillance was performed on 01/16/23. This showed an increase in a nodule that was being followed in the RML. It is pleural based, and measures 6.8 mm, previously 6.5 mm in April 2024, previously 4.4 mm in February 2024.  He went on to receive stereotactic body radiotherapy (SBRT) to this site which he completed on 02/17/23.   He has been followed with surveillance MRIs as well since his original treatment with SRS and then subsequent  reirradiation in August 2023. He also continues in surveillance with [name] He will have a repeat CT in about 2 months for follow up to continue to evaluate a cystic area in the LLL in addition to for restaging purposes.  Of note an MRI brain on 05/13/23 showed a 2 mm area of enhancement in the right cerebellar hemisphere, which was a part of his previous treatment after discussion in multidisciplinary brain oncology conference. His most recent MRI brain on 08/18/23 showed a 3 mm enhancing lesion in the lateral right cerebellar hemisphere, slightly increased since November 2024, but after fusion review by physics, this area was completely treated. In addition the vermian lesion measured 2 cm and again is unchanged.    In the spring of 2025, he was follow up with 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 He  discussed his symptoms with [name], and a CT of the chest abdomen and pelvis on 10/19/2023 showed concerns for progressive change in the left supraclavicular lymph node station measuring up to 3.9 cm, by PET scan on 11/05/2023, this area had significant hypermetabolic activity with an SUV of 18.3.  His case was discussed in multidisciplinary thoracic oncology conference and recommendations were for a biopsy which was performed on 11/20/23 and showed metastatic poorly differentiated carcinoma there was description of this having somewhat less concise IHC and could  also be consistent with pancreatic or GI origin. He completed palliative radiation to the left supraclavicular region on 12/24/23. He is now followed in surveillance with [name] and there had been concern for a low density lesion in the pancreas 8 mm in May 2025, and 1.4 cm on 01/29/24 though read as stable. He will follow up with [name] with this as well. His neck CT scan on 01/1524 as  well showed multiple enlarged and partially necrotic nodes in the left supraclavicular region, adjacently as well as inferior along the left internal jugular vein. [name] plans another PET scan in 3 months time. His recent MRI brain on 02/23/24 showed an enhancing lesion in the cerebellar vermis again measured 1.8 cm and was consistent with prior treated disease, a 5 mm enhancing lesion in the lateral right cerebellar hemisphere had increased in size however had previously been 3 mm and has been fully treated.  New findings showed an increase in the conspicuity of the lesion in the left parietal lobe, and an unchanged punctate enhancing focus in the right superior frontal gyrus has been seen dating back to 2022 and is not treated.  We continue to follow this closely.  Conversation in conference this morning was to continue to follow the patient closely as the area that was described as new in the left parietal to occipital junction has been at the edge of the prior treatment field.  He is contacted by phone to review this result.   PREVIOUS RADIATION THERAPY:  12/10/23-12/24/23: Palliative Treatment  Plan Name: HN_L Site: Neck Left Technique: 3D Mode: Photon Dose Per Fraction: 3 Gy Prescribed Dose (Delivered / Prescribed): 30 Gy / 30 Gy Prescribed Fxs (Delivered / Prescribed): 10 / 10  02/10/23-02/17/23 SBRT Treatment The tumor in the RML was treated with a course of stereotactic body radiation treatment. The patient will complete his therapy next week and will receive a total of 54 Gy In 3 fractions at 18 G per fraction.  02/12/2022 through 02/12/2022 SRS Treatment Site Technique Total Dose (Gy) Dose per Fx (Gy) Completed Fx Beam Energies  Brain:  [id number]_[id number]_56mm Mid Cerebellum IMRT 20/20 20 1/1 6XFFF    04/20/20 SRS Treatment: Each site below was treated to 20 Gy in 1 fraction PTV1 Rt Cerebellum 4mm PTV2Mid cerebellum 11mm PTV3Lt Parietal 6mm    PAST MEDICAL HISTORY:  Past Medical  History:  Diagnosis Date   Anemia 06/24/2012   ANXIETY 02/03/2007   Cervical disc disease 02/12/2012   S/p surgury jan 2013   Erectile dysfunction 02/11/2011   GERD (gastroesophageal reflux disease)    GLUCOSE INTOLERANCE 01/04/2008   HYPERLIPIDEMIA 02/03/2007   HYPERTENSION 02/03/2007   IBS (irritable bowel syndrome)    Impaired glucose tolerance 02/11/2011   nscl ca 03/30/2020   Smoker 08/22/2014   Substance abuse (HCC) 2001   sober for 16 yrs       PAST SURGICAL HISTORY: Past Surgical History:  Procedure Laterality Date   BRONCHIAL BIOPSY  03/30/2020   Procedure: BRONCHIAL BIOPSIES;  Surgeon: Brenna Adine CROME, DO;  Location: [facility];  Service: Pulmonary;;   BRONCHIAL BRUSHINGS  03/30/2020   Procedure: BRONCHIAL BRUSHINGS;  Surgeon: Brenna Adine CROME, DO;  Location: [facility];  Service: Pulmonary;;   BRONCHIAL NEEDLE ASPIRATION BIOPSY  03/30/2020   Procedure: BRONCHIAL NEEDLE ASPIRATION BIOPSIES;  Surgeon: Brenna Adine CROME, DO;  Location: [facility];  Service: Pulmonary;;   BRONCHIAL WASHINGS  03/30/2020   Procedure: BRONCHIAL WASHINGS;  Surgeon: Brenna Adine CROME, DO;  Location: [facility];  Service: Pulmonary;;   COLONOSCOPY  2007   IR IMAGING GUIDED PORT INSERTION  04/23/2020   neck fusion  2012   C4   NO PAST SURGERIES     VIDEO BRONCHOSCOPY WITH ENDOBRONCHIAL NAVIGATION N/A 03/30/2020   Procedure: VIDEO BRONCHOSCOPY WITH ENDOBRONCHIAL NAVIGATION;  Surgeon: Brenna Adine CROME, DO;  Location: [facility];  Service: Pulmonary;  Laterality: N/A;   VIDEO BRONCHOSCOPY WITH ENDOBRONCHIAL ULTRASOUND N/A 03/30/2020   Procedure: VIDEO BRONCHOSCOPY WITH ENDOBRONCHIAL ULTRASOUND;  Surgeon: Brenna Adine CROME, DO;  Location: [facility];  Service: Pulmonary;  Laterality: N/A;     FAMILY HISTORY:  Family History  Problem Relation Age of Onset   Cancer Mother        esophagus   Cancer Cousin    Stroke Other    Hypertension Other    Colon cancer Neg Hx      SOCIAL  HISTORY:  reports that he quit smoking about 4 years ago. His smoking use included cigarettes. He started smoking about 34 years ago. He has a 45 pack-year smoking history. He has quit using smokeless tobacco.  His smokeless tobacco use included chew. He reports that he does not drink alcohol and does not use drugs. The patient is married and resides in [place]. He enjoys fishing and throwing horseshoes and recently took up this hobby again. 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   ALLERGIES: Patient has no known allergies.   MEDICATIONS:  Current Outpatient Medications  Medication Sig Dispense Refill   aspirin  325 MG EC tablet Take 1 tablet (325 mg total) by mouth daily. 90 tablet 99   b complex vitamins tablet Take 1 tablet by mouth daily.     cholecalciferol (VITAMIN D3) 25 MCG (1000 UNIT) tablet Take 1,000 Units by mouth daily.     folic acid  (FOLVITE ) 1 MG tablet Take 1 tablet by mouth once daily 90 tablet 3   Garlic 1000 MG CAPS Take 1,000 mg by mouth daily.      lidocaine -prilocaine  (EMLA ) cream Apply to the Port-A-Cath site 30-60 minutes before chemotherapy. 30 g 0   Omega-3 Fatty Acids (FISH OIL PO) Take 1 capsule by mouth daily.      polyethylene glycol (MIRALAX / GLYCOLAX) 17 g packet Take 17 g by mouth daily as needed.     predniSONE  (DELTASONE ) 10 MG tablet 3 tabs by mouth per day for 3 days,2tabs per day for 3 days,1tab per day for 3 days 18 tablet 0   prochlorperazine  (COMPAZINE ) 10 MG tablet Take 1 tablet (10 mg total) by mouth every 6 (six) hours as needed for nausea or vomiting. 30 tablet 0   rosuvastatin  (CRESTOR ) 40 MG tablet Take 1 tablet (40 mg total) by mouth daily. 90 tablet 3   triamcinolone  cream (KENALOG ) 0.1 % Apply 1 Application topically 2 (two) times daily. 30 g 1   vardenafil  (LEVITRA ) 20 MG tablet TAKE 1 TABLET BY MOUTH AS NEEDED FOR  ERECTILE  DYSFUNCTION 10 tablet 5   No current facility-administered medications for this  visit.     REVIEW OF SYSTEMS: On review of systems, the patient reports he is doing pretty well. No other complaints of edema or headaches, visual change, or dizziness are noted. No other complaints are verbalized.   PHYSICAL EXAM:  Unable to assess due to encounter type   ECOG = 0  0 - Asymptomatic (Fully active, able to carry on all predisease activities without restriction)  1 - Symptomatic but completely ambulatory (Restricted in physically strenuous activity but ambulatory and able to carry out work of a light or sedentary nature. For example, light housework, office work)  2 - Symptomatic, <50% in bed during the day (Ambulatory and capable of all self care but unable to carry out any work activities. Up and about more than 50% of waking hours)  3 - Symptomatic, >50% in bed, but not bedbound (Capable of only limited self-care, confined to bed or chair 50% or more of waking hours)  4 - Bedbound (Completely disabled. Cannot carry on any self-care. Totally confined to bed or chair)  5 - Death   Raylene MM, Creech RH, Tormey DC, et al. [phone]). Toxicity and response criteria of the [facility]. Am. DOROTHA Bridges. Oncol. 5 (6): 649-55    LABORATORY DATA:  Lab Results  Component Value Date   WBC 3.6 (L) 01/29/2024   HGB 13.0 01/29/2024   HCT 38.6 (L) 01/29/2024   MCV 78.1 (L) 01/29/2024   PLT 120 (L) 01/29/2024   Lab Results  Component Value Date   NA 137 01/29/2024   K 4.0 01/29/2024   CL 106 01/29/2024   CO2 26 01/29/2024   Lab Results  Component Value Date   ALT 8 01/29/2024   AST 17 01/29/2024   ALKPHOS 56 01/29/2024   BILITOT 0.5 01/29/2024      RADIOGRAPHY: MR Brain W Wo Contrast Result Date: 02/23/2024 CLINICAL DATA:  Provided history: Metastasis to brain. Brain metastases, assess treatment response. EXAM: MRI HEAD WITHOUT AND WITH CONTRAST TECHNIQUE: Multiplanar, multiecho pulse sequences of the brain and surrounding structures were obtained  without and with intravenous contrast. CONTRAST:  7.5 mL Vueway  intravenous contrast. COMPARISON:  Prior brain MRI examinations 08/18/2023 and earlier. FINDINGS: Brain: Mild generalized cerebral atrophy. Unchanged size of a peripherally enhancing lesion within the cerebellar vermis, again measuring 1.8 x 2.0 cm (AP x TV). Scattered foci of susceptibility-weighted signal loss at this site consistent with hemosiderin deposition and/or mineralization, as before. Mild surrounding edema has remained stable. A 5 mm enhancing lesion within the lateral right cerebellar hemisphere has increased in size since the exam of 08/18/2023 (previously 3 mm) (series 13, image 42). No significant surrounding edema. A 3 mm focus of parenchymal enhancement at the left parietooccipital junction has slightly increased in conspicuity since the MRI of 08/18/2023 (series 13, image 104). This was thought to reflect a developmental venous anomaly on the prior study. However in correlating with prior examinations, no developmental venous anomaly was present here previously and a metastasis was present at this site on more remote examinations (for instance, on the brain MRI of 04/12/2020). Unchanged punctate enhancing focus within the right superior frontal gyrus (series 13, image 127). This has been present on prior examinations dating back to 2022. No new enhancing intracranial lesion is identified. Multifocal T2 FLAIR hyperintense signal abnormality within the cerebral white matter, nonspecific but compatible with moderate chronic small vessel ischemic disease. There is no acute infarct No extra-axial fluid collection. No midline shift. Vascular: Maintained flow voids within the proximal large arterial vessels. Skull and upper cervical spine: No focal worrisome marrow lesion. Sinuses/Orbits: No mass or acute finding within the imaged orbits. 2.2 cm right maxillary sinus mucous retention cyst. Other: 2.5 cm enhancing the right parotid gland  mass. Trace fluid within bilateral mastoid air cells. Impressions #1 and #2 will be called to the ordering clinician or representative by the Radiologist Assistant, and communication documented in  the PACS or [employer]. IMPRESSION: 1. 3 mm focus of parenchymal enhancement at the left parietooccipital junction, slightly more conspicuous as compared to the MRI of 08/18/2023 and new from the MRI of 05/13/2023. A metastasis was present in this vicinity on more remote examinations (for instance, on the examination of 04/12/2020). A short-interval follow-up MRI (with and without contrast) is recommended to exclude recurrent tumor at this site. 2. Additionally, a 5 mm enhancing lesion within the lateral right cerebellar hemisphere has increased in size (previously 3 mm). 3. 2 cm cerebellar vermis lesion with mild surrounding edema, unchanged. 4. 2.5 cm right parotid gland mass, unchanged. Electronically Signed   By: Rockey Childs D.O.   On: 02/23/2024 20:44        IMPRESSION/PLAN: 1. Stage IV non-small cell lung cancer, adenocarcinoma involving multifocal disease in the lung, nodes, and brain metastases.  The patient is clinically doing well and has recently changed blood pressure medications with his PCP since he was hypotensive. His symptoms of dizziness and weakness have greatly improved with this change. We reviewed his recent imaging with [name], as well as his recent MRI brain. We will continue to follow him closely given the more prominent area along the periphery of his prior cerebellar treatment. He is in agreement and will contact us  sooner if he has further symptoms, but we will plan another MRI brain in 3 months time.  2. Right parotid pleomorphic adenoma. While his biopsy in 2023 was benign, he continues to see [name] on an annual basis due to risks of malignant transformation.      This encounter was conducted via telephone.  The patient has provided two factor identification and  has given verbal consent for this type of encounter and has been advised to only accept a meeting of this type in a secure network environment. The time spent during this encounter was 35 minutes including preparation, discussion, and coordination of the patient's care. The attendants for this meeting include Sharene Cary, RN,   Donald Estefana Husband  and Harman CHRISTELLA Bloodgood.  During the encounter,  Sharene Cary, RN,  and Donald Estefana Husband were located at [facility] Radiation Oncology Department.  Yani M Chmiel was located at home.        Donald KYM Husband, PAC

## 2024-03-01 ENCOUNTER — Other Ambulatory Visit: Payer: Self-pay | Admitting: Radiation Therapy

## 2024-03-01 DIAGNOSIS — C7931 Secondary malignant neoplasm of brain: Secondary | ICD-10-CM

## 2024-03-31 ENCOUNTER — Other Ambulatory Visit: Payer: Self-pay

## 2024-03-31 ENCOUNTER — Other Ambulatory Visit: Payer: Self-pay | Admitting: Internal Medicine

## 2024-04-07 DIAGNOSIS — H5213 Myopia, bilateral: Secondary | ICD-10-CM | POA: Diagnosis not present

## 2024-04-25 ENCOUNTER — Encounter (HOSPITAL_COMMUNITY)
Admission: RE | Admit: 2024-04-25 | Discharge: 2024-04-25 | Disposition: A | Discharge status: Home / Self Care | Source: Ambulatory Visit | Attending: Internal Medicine | Admitting: Internal Medicine

## 2024-04-25 DIAGNOSIS — C3432 Malignant neoplasm of lower lobe, left bronchus or lung: Secondary | ICD-10-CM | POA: Diagnosis not present

## 2024-04-25 DIAGNOSIS — C3490 Malignant neoplasm of unspecified part of unspecified bronchus or lung: Secondary | ICD-10-CM | POA: Insufficient documentation

## 2024-04-25 LAB — GLUCOSE, CAPILLARY: Glucose-Capillary: 89 mg/dL (ref 70–99)

## 2024-04-25 MED ORDER — FLUDEOXYGLUCOSE F - 18 (FDG) INJECTION
8.3500 | Freq: Once | INTRAVENOUS | Status: AC
  Administered 2024-04-25: 8.21 via INTRAVENOUS

## 2024-05-10 ENCOUNTER — Ambulatory Visit (INDEPENDENT_AMBULATORY_CARE_PROVIDER_SITE_OTHER)

## 2024-05-10 VITALS — Ht 72.0 in | Wt 167.0 lb

## 2024-05-10 DIAGNOSIS — Z0000 Encounter for general adult medical examination without abnormal findings: Secondary | ICD-10-CM

## 2024-05-10 NOTE — Progress Notes (Signed)
 Chief Complaint  Patient presents with   Medicare Wellness     Subjective:  Please attest and cosign this visit due to patients primary care provider not being in the office at the time the visit was completed.  (Pt of Dr [name] Molt)   Nicolas Allen is a 68 y.o. male who presents for a Medicare Annual Wellness Visit.  I connected with  Adrion M Litzenberger on 05/10/24 by a audio enabled telemedicine application and verified that I am speaking with the correct person using two identifiers.  Patient Location: Home  Provider Location: Office/Clinic  Persons Participating in Visit: Patient.  I discussed the limitations of evaluation and management by telemedicine. The patient expressed understanding and agreed to proceed.  Vital Signs: Because this visit was a virtual/telehealth visit, some criteria may be missing or patient reported. Any vitals not documented were not able to be obtained and vitals that have been documented are patient reported.   Allergies (verified) Patient has no known allergies.   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   Past Surgical History:  Procedure Laterality Date   BRONCHIAL BIOPSY  03/30/2020   Procedure: BRONCHIAL BIOPSIES;  Surgeon: Brenna Adine CROME, DO;  Location: [facility];  Service: Pulmonary;;   BRONCHIAL BRUSHINGS  03/30/2020   Procedure: BRONCHIAL BRUSHINGS;  Surgeon: Brenna Adine CROME, DO;  Location: [facility];  Service: Pulmonary;;   BRONCHIAL NEEDLE ASPIRATION BIOPSY  03/30/2020   Procedure:  BRONCHIAL NEEDLE ASPIRATION BIOPSIES;  Surgeon: Brenna Adine CROME, DO;  Location: [facility];  Service: Pulmonary;;   BRONCHIAL WASHINGS  03/30/2020   Procedure: BRONCHIAL WASHINGS;  Surgeon: Brenna Adine CROME, DO;  Location: [facility];  Service: Pulmonary;;   COLONOSCOPY  2007   IR IMAGING GUIDED PORT INSERTION  04/23/2020   neck fusion  2012   C4   NO PAST SURGERIES     VIDEO BRONCHOSCOPY WITH ENDOBRONCHIAL NAVIGATION N/A 03/30/2020   Procedure: VIDEO BRONCHOSCOPY WITH ENDOBRONCHIAL NAVIGATION;  Surgeon: Brenna Adine CROME, DO;  Location: [facility];  Service: Pulmonary;  Laterality: N/A;   VIDEO BRONCHOSCOPY WITH ENDOBRONCHIAL ULTRASOUND N/A 03/30/2020   Procedure: VIDEO BRONCHOSCOPY WITH ENDOBRONCHIAL ULTRASOUND;  Surgeon: Brenna Adine CROME, DO;  Location: [facility];  Service: Pulmonary;  Laterality: N/A;   Family History  Problem Relation Age of Onset   Cancer Mother        esophagus   Cancer Cousin    Stroke Other    Hypertension Other    Colon cancer Neg Hx    Social History   Occupational History   Occupation: copy  Tobacco Use   Smoking status: Former    Current packs/day: 0.00    Average packs/day: 1.5 packs/day for 30.0 years (45.0 ttl pk-yrs)    Types: Cigarettes    Start date: 02/21/1990    Quit date: 02/22/2020    Years since quitting: 4.2   Smokeless tobacco: Former    Types: Engineer, Drilling   Vaping status: Never Used  Substance and Sexual Activity  Alcohol use: No    Alcohol/week: 0.0 standard drinks of alcohol    Comment: quit 2000 prior heavy   Drug use: No   Sexual activity: Yes   Tobacco Counseling Counseling given: Not Answered  SDOH Screenings   Food Insecurity: No Food Insecurity (05/10/2024)  Housing: Unknown (05/10/2024)  Transportation Needs: No Transportation Needs (05/10/2024)  Utilities: Not At Risk (05/10/2024)  Alcohol Screen: Low Risk  (02/26/2023)  Depression (PHQ2-9): Low Risk  (05/10/2024)  Financial Resource Strain: Low  Risk  (02/26/2023)  Physical Activity: Insufficiently Active (05/10/2024)  Social Connections: Moderately Integrated (05/10/2024)  Stress: No Stress Concern Present (05/10/2024)  Tobacco Use: Medium Risk (05/10/2024)  Health Literacy: Adequate Health Literacy (05/10/2024)   See flowsheets for full screening details  Depression Screen PHQ 2 & 9 Depression Scale- Over the past 2 weeks, how often have you been bothered by any of the following problems? Little interest or pleasure in doing things: 0 Feeling down, depressed, or hopeless (PHQ Adolescent also includes...irritable): 0 PHQ-2 Total Score: 0 Trouble falling or staying asleep, or sleeping too much: 0 Feeling tired or having little energy: 0 Poor appetite or overeating (PHQ Adolescent also includes...weight loss): 0 Feeling bad about yourself - or that you are a failure or have let yourself or your family down: 0 Trouble concentrating on things, such as reading the newspaper or watching television (PHQ Adolescent also includes...like school work): 0 Moving or speaking so slowly that other people could have noticed. Or the opposite - being so fidgety or restless that you have been moving around a lot more than usual: 0 Thoughts that you would be better off dead, or of hurting yourself in some way: 0 PHQ-9 Total Score: 0 If you checked off any problems, how difficult have these problems made it for you to do your work, take care of things at home, or get along with other people?: Not difficult at all  Depression Treatment Depression Interventions/Treatment : EYV7-0 Score <4 Follow-up Not Indicated     Goals Addressed               This Visit's Progress     Patient Stated (pt-stated)        Patient stated he plans to stay active       Visit info / Clinical Intake: Medicare Wellness Visit Type:: Subsequent Annual Wellness Visit Persons participating in visit:: patient Medicare Wellness Visit Mode:: Telephone If  telephone:: video declined Because this visit was a virtual/telehealth visit:: vitals recorded from last visit If Telephone or Video please confirm:: I connected with the patient using audio enabled telemedicine application and verified that I am speaking with the correct person using two identifiers; I discussed the limitations of evaluation and management by telemedicine; The patient expressed understanding and agreed to proceed Patient Location:: Home Provider Location:: Office Information given by:: patient Interpreter Needed?: No Pre-visit prep was completed: yes AWV questionnaire completed by patient prior to visit?: no Living arrangements:: lives with spouse/significant other Patient's Overall Health Status Rating: good Typical amount of pain: none Does pain affect daily life?: no Are you currently prescribed opioids?: no  Dietary Habits and Nutritional Risks How many meals a day?: 3 Eats fruit and vegetables daily?: yes Most meals are obtained by: preparing own meals; having others provide food; eating out In the last 2 weeks, have you had any of the following?: none Diabetic:: no  Functional Status Activities of Daily Living (to include ambulation/medication): Independent Ambulation: Independent with device- listed below Home  Assistive Devices/Equipment: Eyeglasses; Dentures (specify type) Medication Administration: Independent Home Management: Independent Manage your own finances?: yes Primary transportation is: driving Concerns about vision?: no *vision screening is required for WTM* Concerns about hearing?: no  Fall Screening Falls in the past year?: 0 Number of falls in past year: 0 Was there an injury with Fall?: 0 Fall Risk Category Calculator: 0 Patient Fall Risk Level: Low Fall Risk  Fall Risk Patient at Risk for Falls Due to: No Fall Risks Fall risk Follow up: Falls evaluation completed; Falls prevention discussed  Home and Transportation Safety: All  rugs have non-skid backing?: yes All stairs or steps have railings?: yes Grab bars in the bathtub or shower?: (!) no Have non-skid surface in bathtub or shower?: yes Good home lighting?: yes Regular seat belt use?: yes Hospital stays in the last year:: no  Cognitive Assessment Difficulty concentrating, remembering, or making decisions? : no Will 6CIT or Mini Cog be Completed: yes What year is it?: 0 points What month is it?: 0 points Give patient an address phrase to remember (5 components): [address] [place], [place] About what time is it?: 0 points Count backwards from 20 to 1: 0 points Say the months of the year in reverse: 0 points Repeat the address phrase from earlier: 0 points 6 CIT Score: 0 points  Advance Directives (For Healthcare) Does Patient Have a Medical Advance Directive?: No Would patient like information on creating a medical advance directive?: No - Patient declined  Reviewed/Updated  Reviewed/Updated: Reviewed All (Medical, Surgical, Family, Medications, Allergies, Care Teams, Patient Goals)        Objective:    Today's Vitals   05/10/24 1505  Weight: 167 lb (75.8 kg)  Height: 6' (1.829 m)   Body mass index is 22.65 kg/m.  Current Medications (verified) Outpatient Encounter Medications as of 05/10/2024  Medication Sig   aspirin  325 MG EC tablet Take 1 tablet (325 mg total) by mouth daily.   b complex vitamins tablet Take 1 tablet by mouth daily.   cholecalciferol (VITAMIN D3) 25 MCG (1000 UNIT) tablet Take 1,000 Units by mouth daily.   folic acid  (FOLVITE ) 1 MG tablet Take 1 tablet by mouth once daily   Garlic 1000 MG CAPS Take 1,000 mg by mouth daily.    lidocaine -prilocaine  (EMLA ) cream Apply to the Port-A-Cath site 30-60 minutes before chemotherapy.   Omega-3 Fatty Acids (FISH OIL PO) Take 1 capsule by mouth daily.    polyethylene glycol (MIRALAX / GLYCOLAX) 17 g packet Take 17 g by mouth daily as needed.   predniSONE  (DELTASONE ) 10 MG tablet  3 tabs by mouth per day for 3 days,2tabs per day for 3 days,1tab per day for 3 days   prochlorperazine  (COMPAZINE ) 10 MG tablet Take 1 tablet (10 mg total) by mouth every 6 (six) hours as needed for nausea or vomiting.   rosuvastatin  (CRESTOR ) 40 MG tablet Take 1 tablet (40 mg total) by mouth daily.   triamcinolone  cream (KENALOG ) 0.1 % Apply 1 Application topically 2 (two) times daily.   vardenafil  (LEVITRA ) 20 MG tablet TAKE 1 TABLET BY MOUTH AS NEEDED FOR  ERECTILE  DYSFUNCTION   No facility-administered encounter medications on file as of 05/10/2024.   Hearing/Vision screen Hearing Screening - Comments:: Denies hearing difficulties   Vision Screening - Comments:: Wears rx glasses - up to date with routine eye exams with [facility] Immunizations and Health Maintenance Health Maintenance  Topic Date Due   Influenza Vaccine  01/15/2024   Medicare  Annual Wellness (AWV)  05/10/2025   Colonoscopy  12/09/2025   DTaP/Tdap/Td (3 - Td or Tdap) 02/22/2029   Pneumococcal Vaccine: 50+ Years  Completed   Hepatitis C Screening  Completed   Zoster Vaccines- Shingrix  Completed   Meningococcal B Vaccine  Aged Out   COVID-19 Vaccine  Discontinued        Assessment/Plan:  This is a routine wellness examination for Kiyan.  Patient Care Team: [name], MD as PCP - General  I have personally reviewed and noted the following in the patient's chart:   Medical and social history Use of alcohol, tobacco or illicit drugs  Current medications and supplements including opioid prescriptions. Functional ability and status Nutritional status Physical activity Advanced directives List of other physicians Hospitalizations, surgeries, and ER visits in previous 12 months Vitals Screenings to include cognitive, depression, and falls Referrals and appointments  No orders of the defined types were placed in this encounter.  In addition, I have reviewed and discussed with patient certain preventive  protocols, quality metrics, and best practice recommendations. A written personalized care plan for preventive services as well as general preventive health recommendations were provided to patient.   Verdie CHRISTELLA Saba, CMA   05/10/2024   Return in 1 year (on 05/10/2025).  After Visit Summary: (MyChart) Due to this being a telephonic visit, the after visit summary with patients personalized plan was offered to patient via MyChart   Nurse Notes: Scheduled 2026 AWV appt.

## 2024-05-10 NOTE — Patient Instructions (Addendum)
 Nicolas Allen,  Thank you for taking the time for your Medicare Wellness Visit. I appreciate your continued commitment to your health goals. Please review the care plan we discussed, and feel free to reach out if I can assist you further.  Please note that Annual Wellness Visits do not include a physical exam. Some assessments may be limited, especially if the visit was conducted virtually. If needed, we may recommend an in-person follow-up with your provider.  Ongoing Care Seeing your primary care provider every 3 to 6 months helps us  monitor your health and provide consistent, personalized care.   Referrals If a referral was made during today's visit and you haven't received any updates within two weeks, please contact the referred provider directly to check on the status.  Recommended Screenings:  Health Maintenance  Topic Date Due   Flu Shot  01/15/2024   Medicare Annual Wellness Visit  05/10/2025   Colon Cancer Screening  12/09/2025   DTaP/Tdap/Td vaccine (3 - Td or Tdap) 02/22/2029   Pneumococcal Vaccine for age over 83  Completed   Hepatitis C Screening  Completed   Zoster (Shingles) Vaccine  Completed   Meningitis B Vaccine  Aged Out   COVID-19 Vaccine  Discontinued       05/10/2024    3:08 PM  Advanced Directives  Does Patient Have a Medical Advance Directive? No  Would patient like information on creating a medical advance directive? No - Patient declined    Vision: Annual vision screenings are recommended for early detection of glaucoma, cataracts, and diabetic retinopathy. These exams can also reveal signs of chronic conditions such as diabetes and high blood pressure.  Dental: Annual dental screenings help detect early signs of oral cancer, gum disease, and other conditions linked to overall health, including heart disease and diabetes.

## 2024-05-20 NOTE — Progress Notes (Signed)
 Follow up call to discuss brain MRI results from 05/24/24.  Pain/headache/vision changes?

## 2024-05-23 ENCOUNTER — Encounter: Payer: Self-pay | Admitting: Radiation Oncology

## 2024-05-24 ENCOUNTER — Inpatient Hospital Stay
Admission: RE | Admit: 2024-05-24 | Discharge: 2024-05-24 | Attending: Radiation Oncology | Admitting: Radiation Oncology

## 2024-05-24 ENCOUNTER — Telehealth: Payer: Self-pay

## 2024-05-24 DIAGNOSIS — J341 Cyst and mucocele of nose and nasal sinus: Secondary | ICD-10-CM | POA: Diagnosis not present

## 2024-05-24 DIAGNOSIS — K118 Other diseases of salivary glands: Secondary | ICD-10-CM | POA: Diagnosis not present

## 2024-05-24 DIAGNOSIS — C7931 Secondary malignant neoplasm of brain: Secondary | ICD-10-CM

## 2024-05-24 MED ORDER — GADOPICLENOL 0.5 MMOL/ML IV SOLN
7.5000 mL | Freq: Once | INTRAVENOUS | Status: AC | PRN
  Administered 2024-05-24: 7.5 mL via INTRAVENOUS

## 2024-05-24 NOTE — Telephone Encounter (Signed)
 Copied from CRM #[id number]. Topic: General - Other >> May 23, 2024  4:15 PM Hadassah PARAS wrote: Reason for CRM:  Pt has jury duty and wanted PCP to excused him from attending as pt has Stage 4 Lung Cancer. Please advise pt on either [phone]

## 2024-05-24 NOTE — Telephone Encounter (Signed)
 Ok this was done through [employer]  and pt should let us  know if he needs it mailed instead.    thanks

## 2024-05-25 NOTE — Telephone Encounter (Signed)
 Called and left voice mail

## 2024-05-30 ENCOUNTER — Encounter: Payer: Self-pay | Admitting: Radiation Oncology

## 2024-05-30 ENCOUNTER — Inpatient Hospital Stay

## 2024-05-30 ENCOUNTER — Ambulatory Visit
Admission: RE | Admit: 2024-05-30 | Discharge: 2024-05-30 | Attending: Radiation Oncology | Admitting: Radiation Oncology

## 2024-05-30 DIAGNOSIS — C3491 Malignant neoplasm of unspecified part of right bronchus or lung: Secondary | ICD-10-CM

## 2024-05-30 DIAGNOSIS — C3401 Malignant neoplasm of right main bronchus: Secondary | ICD-10-CM | POA: Diagnosis not present

## 2024-05-30 DIAGNOSIS — C7931 Secondary malignant neoplasm of brain: Secondary | ICD-10-CM | POA: Diagnosis not present

## 2024-05-31 NOTE — Progress Notes (Signed)
 Radiation Oncology         [phone] ________________________________  Outpatient Follow Up - Conducted via telephone at patient request.  I spoke with the patient to conduct this visit via telephone. The patient was notified in advance and was offered an in person or telemedicine meeting to allow for face to face communication but instead preferred to proceed with a telephone visit.   Name: Nicolas Allen        MRN: [id number]  Date of Service: 05/30/2024 DOB: [birth year]  RR:[name], [name] ORN, MD  [name], MD     REFERRING PHYSICIAN: [name], MD   DIAGNOSIS: The primary encounter diagnosis was Non-small cell carcinoma of right lung, stage 4 (HCC). A diagnosis of Malignant neoplasm metastatic to brain [facility]) was also pertinent to this visit.   HISTORY OF PRESENT ILLNESS: Nicolas Allen is a 68 y.o. male with a history of stage IV non-small cell lung cancer, adenocarcinoma involving multifocal disease in the lung and 3 brain metastases. He was diagnosed in the fall of 2021 and found to have metastatic disease in the lung and brain for which he has received stereotactic radiosurgery [name]) with [name].  While he did have pseudoprogression due to his immunotherapy, there was growth in the same mid cerebellar lesion and after discussion in brain oncology conference, it was felt he should pursue re-irradiation which was completed with SRS  on 02/12/22. He completed consolidative chemo/immunotherapy on 09/25/22 with [name].   A CT CAP for surveillance was performed on 01/16/23. This showed an increase in a nodule that was being followed in the RML. It is pleural based, and measures 6.8 mm, previously 6.5 mm in April 2024, previously 4.4 mm in February 2024.  He went on to receive stereotactic body radiotherapy (SBRT) to this site which he completed on 02/17/23.   He has been followed with surveillance MRIs as well since his original treatment with SRS and then subsequent  reirradiation in August 2023. He also continues in surveillance with [name] He will have a repeat CT in about 2 months for follow up to continue to evaluate a cystic area in the LLL in addition to for restaging purposes.  Of note an MRI brain on 05/13/23 showed a 2 mm area of enhancement in the right cerebellar hemisphere, which was a part of his previous treatment after discussion in multidisciplinary brain oncology conference. His most recent MRI brain on 08/18/23 showed a 3 mm enhancing lesion in the lateral right cerebellar hemisphere, slightly increased since November 2024, but after fusion review by physics, this area was completely treated. In addition the vermian lesion measured 2 cm and again is unchanged.    In the spring of 2025, he was follow up with 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 He  discussed his symptoms with [name], and a CT of the chest abdomen and pelvis on 10/19/2023 showed concerns for progressive change in the left supraclavicular lymph node station measuring up to 3.9 cm, by PET scan on 11/05/2023, this area had significant hypermetabolic activity with an SUV of 18.3.  His case was discussed in multidisciplinary thoracic oncology conference and recommendations were for a biopsy which was performed on 11/20/23 and showed metastatic poorly differentiated carcinoma there was description of this having somewhat less concise IHC and could  also be consistent with pancreatic or GI origin. He completed palliative radiation to the left supraclavicular region on 12/24/23.   He has continued in surveillance with [name] and there had been concern for a low density lesion in the pancreas 8 mm in May 2025, and 1.4 cm on 01/29/24 though read as stable.  He continued to have prominence in the supraclavicular node on CT imaging in  August 2025 and persistent change in the brain on 02/23/2024 with his prior treatment sites.  His most recent PET on 04/25/2024 showed low-level activity in the areas of the lung and supraclavicular region.  He did not have increased activity in the pancreatic region.  An MRI brain for surveillance on 05/24/2024 continues to show stability in the areas that are being followed.  No new lesions were appreciated.  Stability in the cerebellar vermis measuring 2 cm was again seen, 7 mm lesion in the right cerebellum laterally with slight increase in edema was noted and 3 to 4 mm focus of enhancement in the left parieto-occipital junction and 2.4 cm enhancing lesion in the right parotid gland.  He is contacted today to review these results.  He is unsure when he should see [name] as he did not have a follow-up to review his PET scan last month.   PREVIOUS RADIATION THERAPY:  12/10/23-12/24/23: Palliative Treatment  Plan Name: HN_L Site: Neck Left Technique: 3D Mode: Photon Dose Per Fraction: 3 Gy Prescribed Dose (Delivered / Prescribed): 30 Gy / 30 Gy Prescribed Fxs (Delivered / Prescribed): 10 / 10  02/10/23-02/17/23 SBRT Treatment The tumor in the RML was treated with a course of stereotactic body radiation treatment. The patient will complete his therapy next week and will receive a total of 54 Gy In 3 fractions at 18 G per fraction.  02/12/2022 through 02/12/2022 SRS Treatment Site Technique Total Dose (Gy) Dose per Fx (Gy) Completed Fx Beam Energies  Brain:  [id number]_[id number]_69mm Mid Cerebellum IMRT 20/20 20 1/1 6XFFF    04/20/20 SRS Treatment: Each site below was treated to 20 Gy in 1 fraction PTV1 Rt Cerebellum 4mm PTV2Mid cerebellum 11mm PTV3Lt Parietal 6mm    PAST MEDICAL HISTORY:  Past Medical History:  Diagnosis Date   Anemia 06/24/2012   ANXIETY 02/03/2007   Cervical disc disease 02/12/2012   S/p surgury jan 2013   Erectile dysfunction 02/11/2011   GERD (gastroesophageal reflux  disease)    GLUCOSE INTOLERANCE 01/04/2008   HYPERLIPIDEMIA 02/03/2007   HYPERTENSION 02/03/2007   IBS (irritable bowel syndrome)    Impaired glucose tolerance 02/11/2011   nscl ca 03/30/2020   Smoker 08/22/2014   Substance abuse (HCC) 2001   sober for 16 yrs       PAST SURGICAL HISTORY: Past Surgical History:  Procedure Laterality Date   BRONCHIAL BIOPSY  03/30/2020   Procedure: BRONCHIAL BIOPSIES;  Surgeon: Brenna Adine CROME, DO;  Location: [facility];  Service: Pulmonary;;   BRONCHIAL BRUSHINGS  03/30/2020   Procedure: BRONCHIAL BRUSHINGS;  Surgeon: Brenna Adine CROME, DO;  Location: [facility];  Service: Pulmonary;;   BRONCHIAL NEEDLE ASPIRATION BIOPSY  03/30/2020   Procedure: BRONCHIAL NEEDLE ASPIRATION BIOPSIES;  Surgeon: Brenna Adine CROME, DO;  Location: [facility];  Service: Pulmonary;;   BRONCHIAL WASHINGS  03/30/2020   Procedure: BRONCHIAL WASHINGS;  Surgeon: Brenna Adine CROME, DO;  Location: [facility];  Service: Pulmonary;;   COLONOSCOPY  2007   IR IMAGING GUIDED PORT INSERTION  04/23/2020   neck fusion  2012   C4  NO PAST SURGERIES     VIDEO BRONCHOSCOPY WITH ENDOBRONCHIAL NAVIGATION N/A 03/30/2020   Procedure: VIDEO BRONCHOSCOPY WITH ENDOBRONCHIAL NAVIGATION;  Surgeon: Brenna Adine CROME, DO;  Location: [facility];  Service: Pulmonary;  Laterality: N/A;   VIDEO BRONCHOSCOPY WITH ENDOBRONCHIAL ULTRASOUND N/A 03/30/2020   Procedure: VIDEO BRONCHOSCOPY WITH ENDOBRONCHIAL ULTRASOUND;  Surgeon: Brenna Adine CROME, DO;  Location: [facility];  Service: Pulmonary;  Laterality: N/A;     FAMILY HISTORY:  Family History  Problem Relation Age of Onset   Cancer Mother        esophagus   Cancer Cousin    Stroke Other    Hypertension Other    Colon cancer Neg Hx      SOCIAL HISTORY:  reports that he quit smoking about 4 years ago. His smoking use included cigarettes. He started smoking about 34 years ago. He has a 45 pack-year smoking history. He has quit using  smokeless tobacco.  His smokeless tobacco use included chew. He reports that he does not drink alcohol and does not use drugs. The patient is married and resides in [place]. He enjoys fishing and throwing horseshoes and recently took up this hobby again. 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   ALLERGIES: Patient has no known allergies.   MEDICATIONS:  Current Outpatient Medications  Medication Sig Dispense Refill   aspirin  325 MG EC tablet Take 1 tablet (325 mg total) by mouth daily. 90 tablet 99   b complex vitamins tablet Take 1 tablet by mouth daily.     cholecalciferol (VITAMIN D3) 25 MCG (1000 UNIT) tablet Take 1,000 Units by mouth daily.     folic acid  (FOLVITE ) 1 MG tablet Take 1 tablet by mouth once daily 90 tablet 0   Garlic 1000 MG CAPS Take 1,000 mg by mouth daily.      lidocaine -prilocaine  (EMLA ) cream Apply to the Port-A-Cath site 30-60 minutes before chemotherapy. 30 g 0   Omega-3 Fatty Acids (FISH OIL PO) Take 1 capsule by mouth daily.      polyethylene glycol (MIRALAX / GLYCOLAX) 17 g packet Take 17 g by mouth daily as needed.     predniSONE  (DELTASONE ) 10 MG tablet 3 tabs by mouth per day for 3 days,2tabs per day for 3 days,1tab per day for 3 days 18 tablet 0   prochlorperazine  (COMPAZINE ) 10 MG tablet Take 1 tablet (10 mg total) by mouth every 6 (six) hours as needed for nausea or vomiting. 30 tablet 0   rosuvastatin  (CRESTOR ) 40 MG tablet Take 1 tablet (40 mg total) by mouth daily. 90 tablet 3   triamcinolone  cream (KENALOG ) 0.1 % Apply 1 Application topically 2 (two) times daily. 30 g 1   vardenafil  (LEVITRA ) 20 MG tablet TAKE 1 TABLET BY MOUTH AS NEEDED FOR  ERECTILE  DYSFUNCTION 10 tablet 5   No current facility-administered medications for this encounter.     REVIEW OF SYSTEMS: On review of systems, the patient reports that he is doing well without any  difficulty with vision or hearing.  He denies any changes in his coordination or  movement.  He has occasional headaches but states that these resolve with conservative measures like over-the-counter pain relievers.  He denies any new trouble with shortness of breath or difficulty with cough.  No other complaints are verbalized.  PHYSICAL EXAM:  Unable to assess due to encounter type   ECOG = 0  0 - Asymptomatic (Fully active, able to carry on all  predisease activities without restriction)  1 - Symptomatic but completely ambulatory (Restricted in physically strenuous activity but ambulatory and able to carry out work of a light or sedentary nature. For example, light housework, office work)  2 - Symptomatic, <50% in bed during the day (Ambulatory and capable of all self care but unable to carry out any work activities. Up and about more than 50% of waking hours)  3 - Symptomatic, >50% in bed, but not bedbound (Capable of only limited self-care, confined to bed or chair 50% or more of waking hours)  4 - Bedbound (Completely disabled. Cannot carry on any self-care. Totally confined to bed or chair)  5 - Death   Raylene MM, Creech RH, Tormey DC, et al. [phone]). Toxicity and response criteria of the [facility]. Am. DOROTHA Bridges. Oncol. 5 (6): 649-55    LABORATORY DATA:  Lab Results  Component Value Date   WBC 3.6 (L) 01/29/2024   HGB 13.0 01/29/2024   HCT 38.6 (L) 01/29/2024   MCV 78.1 (L) 01/29/2024   PLT 120 (L) 01/29/2024   Lab Results  Component Value Date   NA 137 01/29/2024   K 4.0 01/29/2024   CL 106 01/29/2024   CO2 26 01/29/2024   Lab Results  Component Value Date   ALT 8 01/29/2024   AST 17 01/29/2024   ALKPHOS 56 01/29/2024   BILITOT 0.5 01/29/2024      RADIOGRAPHY: MR Brain W Wo Contrast Result Date: 05/24/2024 EXAM: MRI BRAIN WITH AND WITHOUT CONTRAST 05/24/2024 12:12:18 PM TECHNIQUE: Multiplanar multisequence MRI of the head/brain was performed with and without the administration of intravenous contrast. COMPARISON: MRI  head 02/23/2024 and earlier. CLINICAL HISTORY: Brain metastases, assess treatment response; 3T SRS Protocol. FINDINGS: BRAIN AND VENTRICLES: No acute infarct. No acute intracranial hemorrhage. No mass effect or midline shift. No hydrocephalus. The sella is unremarkable. Normal flow voids. Redemonstrated 2.0 x 1.7 cm peripherally enhancing lesion in the cerebellar vermis. Scattered areas of peripheral susceptibility along this lesion again noted. Surrounding FLAIR signal abnormality suggestive of edema is similar to prior. There is no significant effacement of the 4th ventricle. Similar focus of punctate enhancement in the right superior frontal gyrus on series 14 image 127. There is a 7 x 6 mm enhancing lesion in the lateral right cerebellum previously measuring 6 x 5 mm seen on series 14 image 44. Slightly increased edema in the lateral right cerebellum compared to prior. Additional 3 to 4 mm focus of enhancement at the left parieto-occipital junction is similar to prior seen on series 14 image 105. There are no new intracranial lesions compared to the 02/23/2024 study. T2 and FLAIR hyperintensity in the periventricular and subcortical white matter suggestive of moderate chronic microvascular ischemic changes. ORBITS: No acute abnormality. SINUSES: Retention cyst in the right maxillary sinus. BONES AND SOFT TISSUES: Normal bone marrow signal and enhancement. Similar appearance of 2.4 cm enhancing lesion in the right parotid gland. Trace fluid in the right mastoid air cells. IMPRESSION: 1. No new intracranial lesions compared to the prior study on 02/23/2024. 2. Stable 2.0 x 1.7 cm peripherally enhancing lesion in the cerebellar vermis with surrounding edema, without significant effacement of the fourth ventricle. 3. Stable 7 x 6 mm enhancing lesion in the lateral right cerebellum with slightly increased edema compared to prior. 4. Stable 34 mm focus of enhancement at the left parieto-occipital junction. 5. Stable  2.4 cm enhancing lesion in the right carotid gland. Electronically signed by: Donnice Mania  MD 05/24/2024 01:12 PM EST RP Workstation: [id number]        IMPRESSION/PLAN: 1. Stage IV non-small cell lung cancer, adenocarcinoma involving multifocal disease in the lung, nodes, and brain metastases.  The patient is doing well. He has not met with [name] about his recent PET scan but I will message his team to see about getting back for follow up. We reviewed the results of his MRI scan and would recommend repeat MRI in 4 months time. He is in agreement with this plan.  2. Right parotid pleomorphic adenoma. While his biopsy in 2023 was benign, he continues to see [name] on an annual basis due to risks of malignant transformation and he will see her back in April 2026.    This encounter was conducted via telephone.  The patient has provided two factor identification and has given verbal consent for this type of encounter and has been advised to only accept a meeting of this type in a secure network environment. The time spent during this encounter was 35 minutes including preparation, discussion, and coordination of the patient's care. The attendants for this meeting include   Donald Estefana Husband  and Brahm M Bondar.  During the encounter,   Donald Estefana Husband were located at [facility] Radiation Oncology Department.  Council M Hulett was located at home.        Donald KYM Husband, PAC

## 2024-06-20 ENCOUNTER — Inpatient Hospital Stay: Attending: Internal Medicine

## 2024-06-20 ENCOUNTER — Inpatient Hospital Stay: Admitting: Internal Medicine

## 2024-06-20 ENCOUNTER — Inpatient Hospital Stay

## 2024-06-20 VITALS — BP 116/65 | HR 87 | Temp 98.1°F | Resp 17 | Ht 72.0 in | Wt 173.0 lb

## 2024-06-20 DIAGNOSIS — N289 Disorder of kidney and ureter, unspecified: Secondary | ICD-10-CM | POA: Diagnosis not present

## 2024-06-20 DIAGNOSIS — C7931 Secondary malignant neoplasm of brain: Secondary | ICD-10-CM | POA: Insufficient documentation

## 2024-06-20 DIAGNOSIS — Z79899 Other long term (current) drug therapy: Secondary | ICD-10-CM | POA: Diagnosis not present

## 2024-06-20 DIAGNOSIS — C3411 Malignant neoplasm of upper lobe, right bronchus or lung: Secondary | ICD-10-CM | POA: Insufficient documentation

## 2024-06-20 DIAGNOSIS — Z87891 Personal history of nicotine dependence: Secondary | ICD-10-CM | POA: Insufficient documentation

## 2024-06-20 DIAGNOSIS — C778 Secondary and unspecified malignant neoplasm of lymph nodes of multiple regions: Secondary | ICD-10-CM | POA: Diagnosis not present

## 2024-06-20 DIAGNOSIS — C3490 Malignant neoplasm of unspecified part of unspecified bronchus or lung: Secondary | ICD-10-CM | POA: Diagnosis not present

## 2024-06-20 LAB — CMP (CANCER CENTER ONLY)
ALT: 8 U/L (ref 0–44)
AST: 23 U/L (ref 15–41)
Albumin: 3.7 g/dL (ref 3.5–5.0)
Alkaline Phosphatase: 65 U/L (ref 38–126)
Anion gap: 12 (ref 5–15)
BUN: 14 mg/dL (ref 8–23)
CO2: 22 mmol/L (ref 22–32)
Calcium: 9 mg/dL (ref 8.9–10.3)
Chloride: 104 mmol/L (ref 98–111)
Creatinine: 1.54 mg/dL — ABNORMAL HIGH (ref 0.61–1.24)
GFR, Estimated: 49 mL/min — ABNORMAL LOW
Glucose, Bld: 164 mg/dL — ABNORMAL HIGH (ref 70–99)
Potassium: 3.5 mmol/L (ref 3.5–5.1)
Sodium: 138 mmol/L (ref 135–145)
Total Bilirubin: 0.6 mg/dL (ref 0.0–1.2)
Total Protein: 7 g/dL (ref 6.5–8.1)

## 2024-06-20 LAB — CBC WITH DIFFERENTIAL (CANCER CENTER ONLY)
Abs Immature Granulocytes: 0.01 K/uL (ref 0.00–0.07)
Basophils Absolute: 0 K/uL (ref 0.0–0.1)
Basophils Relative: 1 %
Eosinophils Absolute: 0.1 K/uL (ref 0.0–0.5)
Eosinophils Relative: 2 %
HCT: 42.4 % (ref 39.0–52.0)
Hemoglobin: 14.8 g/dL (ref 13.0–17.0)
Immature Granulocytes: 0 %
Lymphocytes Relative: 35 %
Lymphs Abs: 1.3 K/uL (ref 0.7–4.0)
MCH: 27.5 pg (ref 26.0–34.0)
MCHC: 34.9 g/dL (ref 30.0–36.0)
MCV: 78.7 fL — ABNORMAL LOW (ref 80.0–100.0)
Monocytes Absolute: 0.4 K/uL (ref 0.1–1.0)
Monocytes Relative: 11 %
Neutro Abs: 1.9 K/uL (ref 1.7–7.7)
Neutrophils Relative %: 51 %
Platelet Count: 128 K/uL — ABNORMAL LOW (ref 150–400)
RBC: 5.39 MIL/uL (ref 4.22–5.81)
RDW: 15.6 % — ABNORMAL HIGH (ref 11.5–15.5)
WBC Count: 3.7 K/uL — ABNORMAL LOW (ref 4.0–10.5)
nRBC: 0 % (ref 0.0–0.2)

## 2024-06-20 NOTE — Progress Notes (Signed)
 "     [facility] [phone]   [phone]  OFFICE PROGRESS NOTE  [name], MD [address] [place] [address]  DIAGNOSIS: Stage IV (T3, N2, M1c) non-small cell lung cancer favoring adenocarcinoma presented with right upper lobe lung mass in addition to right lower lobe, left lower lobe in addition to right hilar and mediastinal lymphadenopathy in addition to metastatic brain lesions diagnosed in October 2021.  Molecular studies by [employer]:  [id number], 9.5%,   PRIOR THERAPY:  1) SRS to 3 brain lesion under the care of [name]. 2) Systemic chemotherapy with carboplatin  for AUC of 5, Alimta  500 mg/M2 and Keytruda  200 mg IV every 3 weeks.  First dose April 24, 2020.  Status post 43 cycles.  Starting from cycle #5 the patient will be on maintenance treatment with Alimta  and Keytruda  every 3 weeks.  Last dose  was given on 09/23/2022 discontinued secondary to renal insufficiency and worsening edema 3) palliative radiotherapy to the left supraclavicular lymphadenopathy under the care of [name].  CURRENT THERAPY: Observation.  INTERVAL HISTORY: Nicolas Allen 69 y.o. male returns to the clinic today for follow-up visit accompanied by his wife.  Discussed the use of AI scribe software for clinical note transcription with the patient, who gave verbal consent to proceed.  History of Present Illness Nicolas Allen is a 69 year old male with stage IV non-small cell lung cancer, adenocarcinoma subtype without actionable mutations, who presents for routine oncology follow-up to assess disease status and ongoing symptoms.  Diagnosed with stage IV non-small cell lung cancer, adenocarcinoma subtype, in October 2021. Initial therapy included carboplatin , Alimta , and Keytruda  for four cycles, followed by maintenance Alimta  and Keytruda . Alimta  was discontinued due to renal insufficiency; he remains on maintenance Keytruda . The most recent PET  scan was in November 2025, and the last brain MRI was performed around that time. No interval imaging has been performed since.  He currently reports feeling generally well. He experiences occasional lightheadedness and has a persistent, small palpable spot in his neck, stable in size without new or enlarging lymphadenopathy. He describes mild exertional dyspnea, particularly with ambulation or increased activity, but denies chest pain, hemoptysis, or significant cough. He denies nausea, vomiting, or diarrhea. He reports occasional headaches. When bending over, he notes fluid accumulation in his mouth requiring expectoration, which he attributes to clearing his throat. He is ambulatory but avoids long distances due to shortness of breath.  He also has arthritis, which he distinguishes from his dyspnea.     MEDICAL HISTORY: Past Medical History:  Diagnosis Date   Anemia 06/24/2012   ANXIETY 02/03/2007   Cervical disc disease 02/12/2012   S/p surgury jan 2013   Erectile dysfunction 02/11/2011   GERD (gastroesophageal reflux disease)    GLUCOSE INTOLERANCE 01/04/2008   HYPERLIPIDEMIA 02/03/2007   HYPERTENSION 02/03/2007   IBS (irritable bowel syndrome)    Impaired glucose tolerance 02/11/2011   nscl ca 03/30/2020   Smoker 08/22/2014   Substance abuse (HCC) 2001   sober for 16 yrs    ALLERGIES:  has no known allergies.  MEDICATIONS:  Current Outpatient Medications  Medication Sig Dispense Refill   aspirin  325 MG EC tablet Take 1 tablet (325 mg total) by mouth daily. 90 tablet 99   b complex vitamins tablet Take 1 tablet by mouth daily.     cholecalciferol (VITAMIN D3) 25 MCG (1000 UNIT) tablet Take 1,000 Units by mouth daily.  folic acid  (FOLVITE ) 1 MG tablet Take 1 tablet by mouth once daily 90 tablet 0   Garlic 1000 MG CAPS Take 1,000 mg by mouth daily.      lidocaine -prilocaine  (EMLA ) cream Apply to the Port-A-Cath site 30-60 minutes before chemotherapy. 30 g 0   Omega-3  Fatty Acids (FISH OIL PO) Take 1 capsule by mouth daily.      polyethylene glycol (MIRALAX / GLYCOLAX) 17 g packet Take 17 g by mouth daily as needed.     predniSONE  (DELTASONE ) 10 MG tablet 3 tabs by mouth per day for 3 days,2tabs per day for 3 days,1tab per day for 3 days 18 tablet 0   prochlorperazine  (COMPAZINE ) 10 MG tablet Take 1 tablet (10 mg total) by mouth every 6 (six) hours as needed for nausea or vomiting. 30 tablet 0   rosuvastatin  (CRESTOR ) 40 MG tablet Take 1 tablet (40 mg total) by mouth daily. 90 tablet 3   vardenafil  (LEVITRA ) 20 MG tablet TAKE 1 TABLET BY MOUTH AS NEEDED FOR  ERECTILE  DYSFUNCTION 10 tablet 5   No current facility-administered medications for this visit.    SURGICAL HISTORY:  Past Surgical History:  Procedure Laterality Date   BRONCHIAL BIOPSY  03/30/2020   Procedure: BRONCHIAL BIOPSIES;  Surgeon: Brenna Adine CROME, DO;  Location: [facility];  Service: Pulmonary;;   BRONCHIAL BRUSHINGS  03/30/2020   Procedure: BRONCHIAL BRUSHINGS;  Surgeon: Brenna Adine CROME, DO;  Location: [facility];  Service: Pulmonary;;   BRONCHIAL NEEDLE ASPIRATION BIOPSY  03/30/2020   Procedure: BRONCHIAL NEEDLE ASPIRATION BIOPSIES;  Surgeon: Brenna Adine CROME, DO;  Location: [facility];  Service: Pulmonary;;   BRONCHIAL WASHINGS  03/30/2020   Procedure: BRONCHIAL WASHINGS;  Surgeon: Brenna Adine CROME, DO;  Location: [facility];  Service: Pulmonary;;   COLONOSCOPY  2007   IR IMAGING GUIDED PORT INSERTION  04/23/2020   neck fusion  2012   C4   NO PAST SURGERIES     VIDEO BRONCHOSCOPY WITH ENDOBRONCHIAL NAVIGATION N/A 03/30/2020   Procedure: VIDEO BRONCHOSCOPY WITH ENDOBRONCHIAL NAVIGATION;  Surgeon: Brenna Adine CROME, DO;  Location: [facility];  Service: Pulmonary;  Laterality: N/A;   VIDEO BRONCHOSCOPY WITH ENDOBRONCHIAL ULTRASOUND N/A 03/30/2020   Procedure: VIDEO BRONCHOSCOPY WITH ENDOBRONCHIAL ULTRASOUND;  Surgeon: Brenna Adine CROME, DO;  Location: [facility];  Service:  Pulmonary;  Laterality: N/A;    REVIEW OF SYSTEMS:  A comprehensive review of systems was negative except for: Constitutional: positive for fatigue Respiratory: positive for dyspnea on exertion   PHYSICAL EXAMINATION: General appearance: alert, cooperative, fatigued, and no distress Head: Normocephalic, without obvious abnormality, atraumatic Neck: marked anterior cervical adenopathy, no JVD, supple, symmetrical, trachea midline, thyroid  not enlarged, symmetric, no tenderness/mass/nodules, and left supraclavicular palpable lymph nodes Lymph nodes: Supraclavicular adenopathy: Left Resp: clear to auscultation bilaterally Back: symmetric, no curvature. ROM normal. No CVA tenderness. Cardio: regular rate and rhythm, S1, S2 normal, no murmur, click, rub or gallop GI: soft, non-tender; bowel sounds normal; no masses,  no organomegaly Extremities: extremities normal, atraumatic, no cyanosis or edema  ECOG PERFORMANCE STATUS: 1 - Symptomatic but completely ambulatory  Blood pressure 116/65, pulse 87, temperature 98.1 F (36.7 C), temperature source Temporal, resp. rate 17, height 6' (1.829 m), weight 173 lb (78.5 kg), SpO2 95%.  LABORATORY DATA: Lab Results  Component Value Date   WBC 3.7 (L) 06/20/2024   HGB 14.8 06/20/2024   HCT 42.4 06/20/2024   MCV 78.7 (L) 06/20/2024   PLT 128 (L) 06/20/2024  Chemistry      Component Value Date/Time   NA 137 01/29/2024 0901   K 4.0 01/29/2024 0901   CL 106 01/29/2024 0901   CO2 26 01/29/2024 0901   BUN 16 01/29/2024 0901   CREATININE 1.46 (H) 01/29/2024 0901   CREATININE 0.88 02/21/2020 1115      Component Value Date/Time   CALCIUM  8.8 (L) 01/29/2024 0901   ALKPHOS 56 01/29/2024 0901   AST 17 01/29/2024 0901   ALT 8 01/29/2024 0901   BILITOT 0.5 01/29/2024 0901       RADIOGRAPHIC STUDIES: MR Brain W Wo Contrast Result Date: 05/24/2024 EXAM: MRI BRAIN WITH AND WITHOUT CONTRAST 05/24/2024 12:12:18 PM TECHNIQUE: Multiplanar  multisequence MRI of the head/brain was performed with and without the administration of intravenous contrast. COMPARISON: MRI head 02/23/2024 and earlier. CLINICAL HISTORY: Brain metastases, assess treatment response; 3T SRS Protocol. FINDINGS: BRAIN AND VENTRICLES: No acute infarct. No acute intracranial hemorrhage. No mass effect or midline shift. No hydrocephalus. The sella is unremarkable. Normal flow voids. Redemonstrated 2.0 x 1.7 cm peripherally enhancing lesion in the cerebellar vermis. Scattered areas of peripheral susceptibility along this lesion again noted. Surrounding FLAIR signal abnormality suggestive of edema is similar to prior. There is no significant effacement of the 4th ventricle. Similar focus of punctate enhancement in the right superior frontal gyrus on series 14 image 127. There is a 7 x 6 mm enhancing lesion in the lateral right cerebellum previously measuring 6 x 5 mm seen on series 14 image 44. Slightly increased edema in the lateral right cerebellum compared to prior. Additional 3 to 4 mm focus of enhancement at the left parieto-occipital junction is similar to prior seen on series 14 image 105. There are no new intracranial lesions compared to the 02/23/2024 study. T2 and FLAIR hyperintensity in the periventricular and subcortical white matter suggestive of moderate chronic microvascular ischemic changes. ORBITS: No acute abnormality. SINUSES: Retention cyst in the right maxillary sinus. BONES AND SOFT TISSUES: Normal bone marrow signal and enhancement. Similar appearance of 2.4 cm enhancing lesion in the right parotid gland. Trace fluid in the right mastoid air cells. IMPRESSION: 1. No new intracranial lesions compared to the prior study on 02/23/2024. 2. Stable 2.0 x 1.7 cm peripherally enhancing lesion in the cerebellar vermis with surrounding edema, without significant effacement of the fourth ventricle. 3. Stable 7 x 6 mm enhancing lesion in the lateral right cerebellum with  slightly increased edema compared to prior. 4. Stable 34 mm focus of enhancement at the left parieto-occipital junction. 5. Stable 2.4 cm enhancing lesion in the right carotid gland. Electronically signed by: Donnice Mania MD 05/24/2024 01:12 PM EST RP Workstation: [id number]     ASSESSMENT AND PLAN: This is a very pleasant 69 years old African-American male recently diagnosed with stage IV (T3, N2, M1c)  non-small cell lung cancer, adenocarcinoma presented with right upper lobe lung mass in addition to right lower lobe, left lower lobe in addition to right hilar and mediastinal lymphadenopathy in addition to metastatic brain lesions diagnosed in October 2021. The patient has molecular studies by [employer] that showed no actionable mutations. He underwent SRS treatment to his brain lesion. The patient underwent systemic chemotherapy with carboplatin  for AUC of 5, Alimta  500 mg/M2 and Keytruda  200 mg IV every 3 weeks status post 43 cycles.  Starting from cycle #5 the patient is on maintenance treatment with Alimta  and Keytruda  every 3 weeks. He discontinued maintenance Alimta  and Keytruda  at this point because of  the renal insufficiency, worsening anemia and edema. He had CT scan followed by a PET scan performed recently that showed significant enlargement of left supraclavicular lymphadenopathy suspicious for disease recurrence versus new primary. He had ultrasound-guided core biopsy of the left supraclavicular lymph node and the final pathology was consistent with metastatic poorly differentiated carcinoma. He underwent palliative radiotherapy to the left supraclavicular lymphadenopathy under the care of [name] and there is shrinkage of the mass in the left neck area according to the patient. The patient is currently on observation and he is feeling fine. Assessment and Plan Assessment & Plan Stage IV non-small cell lung cancer, adenocarcinoma Disease remains clinically stable. He is  ambulatory and capable of self-care, limited in exertion by mild dyspnea and intermittent lightheadedness. Palpable cervical lymphadenopathy is unchanged. No new or worsening symptoms. Most recent PET scan (November 2025) and brain MRI were unremarkable. - Ordered CT of neck, chest, abdomen, and pelvis in one month to assess disease status. - Scheduled follow-up one week post-scan to review imaging and discuss management.  Renal insufficiency secondary to chemotherapy Renal insufficiency developed as a complication of prior chemotherapy, resulting in discontinuation of pemetrexed  from his maintenance regimen. He was advised to call immediately if he has any concerning symptoms in the interval.  The patient voices understanding of current disease status and treatment options and is in agreement with the current care plan.  All questions were answered. The patient knows to call the clinic with any problems, questions or concerns. We can certainly see the patient much sooner if necessary. The total time spent in the appointment was 20 minutes.  Disclaimer: This note was dictated with voice recognition software. Similar sounding words can inadvertently be transcribed and may not be corrected upon review.       "

## 2024-06-24 ENCOUNTER — Other Ambulatory Visit: Payer: Self-pay | Admitting: Radiation Therapy

## 2024-06-24 DIAGNOSIS — C7931 Secondary malignant neoplasm of brain: Secondary | ICD-10-CM

## 2024-07-14 ENCOUNTER — Ambulatory Visit (HOSPITAL_COMMUNITY)
Admission: RE | Admit: 2024-07-14 | Discharge: 2024-07-14 | Disposition: A | Discharge status: Home / Self Care | Source: Ambulatory Visit | Attending: Internal Medicine | Admitting: Internal Medicine

## 2024-07-14 DIAGNOSIS — R599 Enlarged lymph nodes, unspecified: Secondary | ICD-10-CM

## 2024-07-14 DIAGNOSIS — C3490 Malignant neoplasm of unspecified part of unspecified bronchus or lung: Secondary | ICD-10-CM | POA: Diagnosis present

## 2024-07-19 ENCOUNTER — Inpatient Hospital Stay

## 2024-07-25 ENCOUNTER — Inpatient Hospital Stay: Admitting: Internal Medicine

## 2024-08-25 ENCOUNTER — Ambulatory Visit: Admitting: Internal Medicine

## 2024-09-27 ENCOUNTER — Other Ambulatory Visit

## 2024-10-03 ENCOUNTER — Inpatient Hospital Stay

## 2024-10-03 ENCOUNTER — Ambulatory Visit: Admitting: Radiation Oncology

## 2025-05-15 ENCOUNTER — Ambulatory Visit
